# Patient Record
Sex: Male | Born: 1960
Health system: Southern US, Community
[De-identification: ages and names within clinical notes are randomized; demographics above are authoritative.]

## PROBLEM LIST (undated history)

## (undated) DIAGNOSIS — I219 Acute myocardial infarction, unspecified: Secondary | ICD-10-CM

## (undated) DIAGNOSIS — Z720 Tobacco use: Secondary | ICD-10-CM

## (undated) DIAGNOSIS — F419 Anxiety disorder, unspecified: Secondary | ICD-10-CM

## (undated) DIAGNOSIS — Z972 Presence of dental prosthetic device (complete) (partial): Secondary | ICD-10-CM

## (undated) DIAGNOSIS — M1990 Unspecified osteoarthritis, unspecified site: Secondary | ICD-10-CM

## (undated) DIAGNOSIS — Z973 Presence of spectacles and contact lenses: Secondary | ICD-10-CM

## (undated) DIAGNOSIS — C61 Malignant neoplasm of prostate: Secondary | ICD-10-CM

## (undated) DIAGNOSIS — C3411 Malignant neoplasm of upper lobe, right bronchus or lung: Secondary | ICD-10-CM

## (undated) DIAGNOSIS — T4145XA Adverse effect of unspecified anesthetic, initial encounter: Secondary | ICD-10-CM

## (undated) DIAGNOSIS — G4730 Sleep apnea, unspecified: Secondary | ICD-10-CM

## (undated) DIAGNOSIS — E785 Hyperlipidemia, unspecified: Secondary | ICD-10-CM

## (undated) DIAGNOSIS — G893 Neoplasm related pain (acute) (chronic): Secondary | ICD-10-CM

## (undated) DIAGNOSIS — R0600 Dyspnea, unspecified: Secondary | ICD-10-CM

## (undated) DIAGNOSIS — H9190 Unspecified hearing loss, unspecified ear: Secondary | ICD-10-CM

## (undated) DIAGNOSIS — T8859XA Other complications of anesthesia, initial encounter: Secondary | ICD-10-CM

## (undated) DIAGNOSIS — J449 Chronic obstructive pulmonary disease, unspecified: Secondary | ICD-10-CM

## (undated) DIAGNOSIS — I2510 Atherosclerotic heart disease of native coronary artery without angina pectoris: Secondary | ICD-10-CM

## (undated) DIAGNOSIS — K219 Gastro-esophageal reflux disease without esophagitis: Secondary | ICD-10-CM

## (undated) DIAGNOSIS — C787 Secondary malignant neoplasm of liver and intrahepatic bile duct: Secondary | ICD-10-CM

## (undated) DIAGNOSIS — I10 Essential (primary) hypertension: Secondary | ICD-10-CM

## (undated) HISTORY — DX: Tobacco use: Z72.0

## (undated) HISTORY — DX: Neoplasm related pain (acute) (chronic): G89.3

## (undated) HISTORY — DX: Acute myocardial infarction, unspecified: I21.9

## (undated) HISTORY — DX: Malignant neoplasm of upper lobe, right bronchus or lung: C34.11

## (undated) HISTORY — DX: Hyperlipidemia, unspecified: E78.5

## (undated) HISTORY — PX: HAND SURGERY: SHX662

## (undated) HISTORY — DX: Atherosclerotic heart disease of native coronary artery without angina pectoris: I25.10

## (undated) HISTORY — DX: Gastro-esophageal reflux disease without esophagitis: K21.9

## (undated) HISTORY — PX: PROSTATE BIOPSY: SHX241

---

## 2005-10-29 HISTORY — PX: CARDIAC CATHETERIZATION: SHX172

## 2006-04-11 ENCOUNTER — Emergency Department (HOSPITAL_COMMUNITY): Admission: EM | Admit: 2006-04-11 | Discharge: 2006-04-11 | Payer: Self-pay | Admitting: Family Medicine

## 2006-04-17 ENCOUNTER — Inpatient Hospital Stay (HOSPITAL_COMMUNITY): Admission: EM | Admit: 2006-04-17 | Discharge: 2006-04-21 | Payer: Self-pay | Admitting: Emergency Medicine

## 2006-04-17 DIAGNOSIS — I219 Acute myocardial infarction, unspecified: Secondary | ICD-10-CM

## 2006-04-17 HISTORY — DX: Acute myocardial infarction, unspecified: I21.9

## 2006-04-17 IMAGING — CR DG CHEST 1V PORT
1 series · 1 of 1 positions shown · non-contrast
Comparison: None.

CLINICAL DATA: Pre-cath.  ST elevation MI.  
PORTABLE CHEST - 1 VIEW:

[view not recorded]
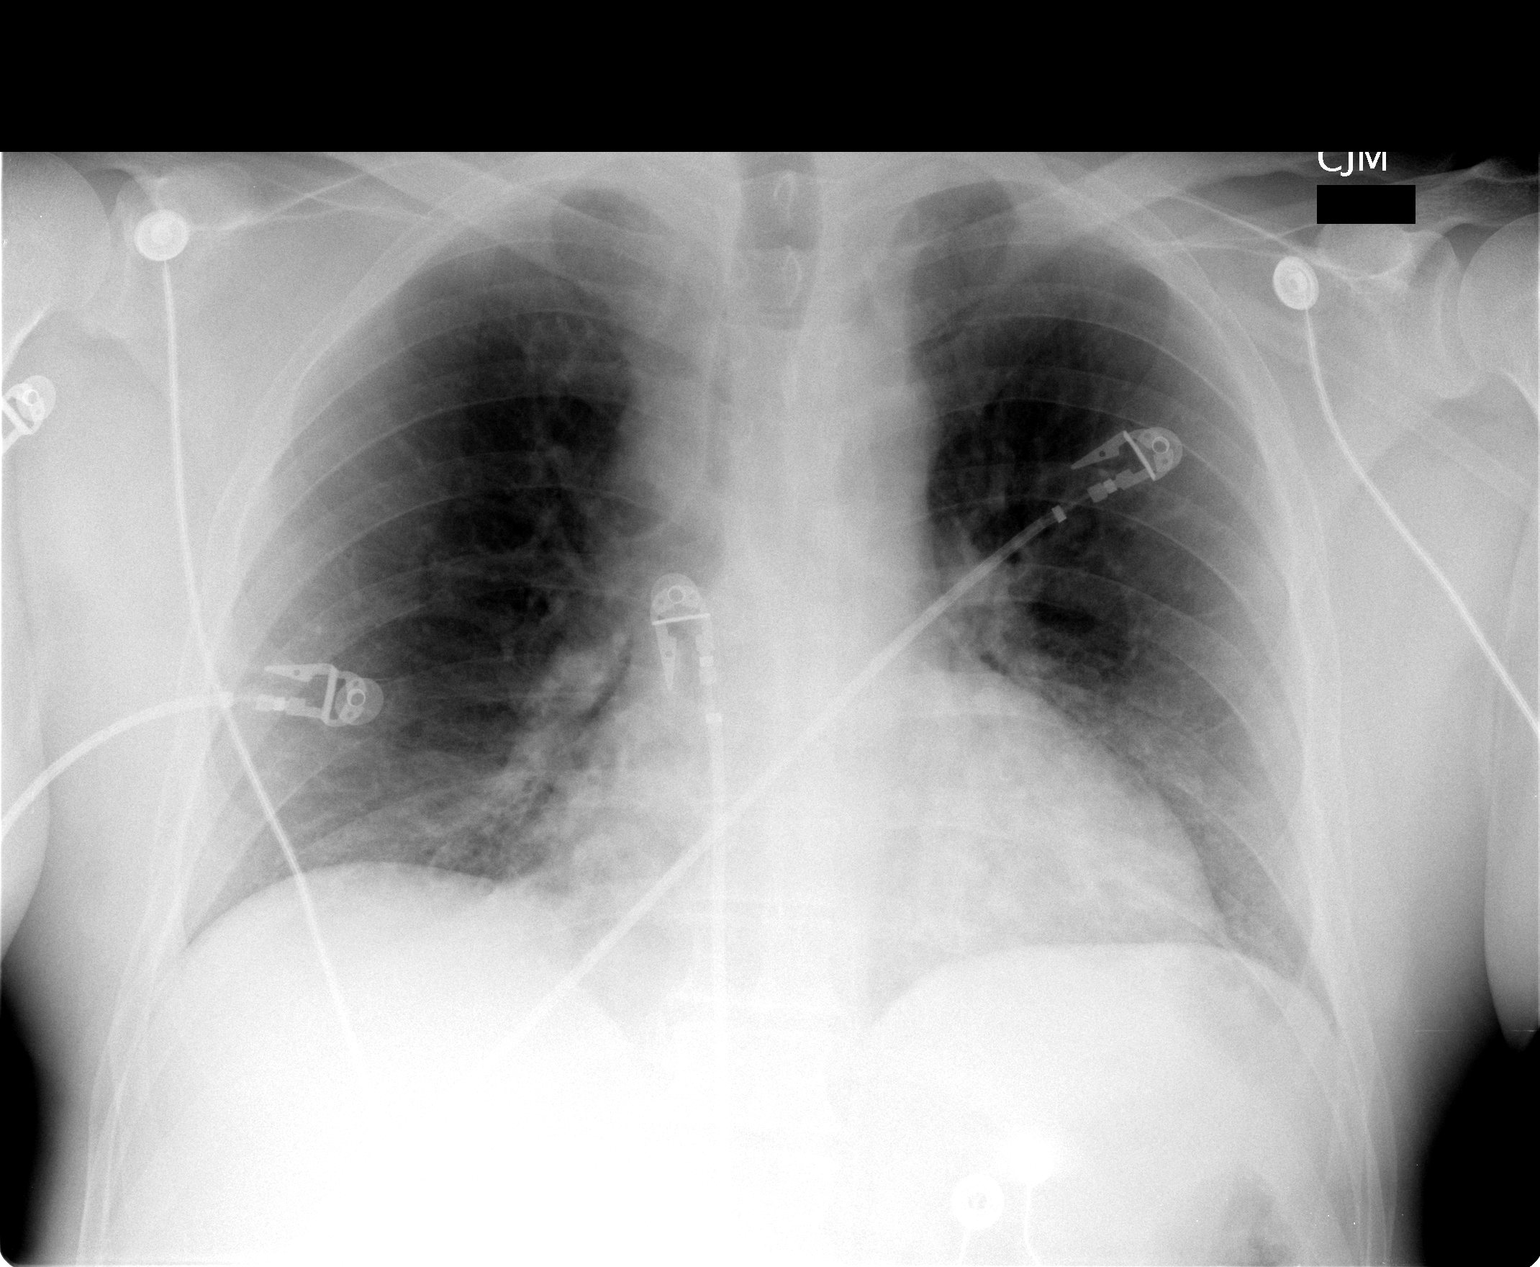

[1 of 1 positions shown; findings below may reference images not displayed]

FINDINGS: Heart size is mildly enlarged without effusions or edema.  There is mild atelectasis at the right base.  No evidence for pneumonia.
IMPRESSION: Cardiomegaly and mild right base atelectasis.

## 2006-04-19 ENCOUNTER — Encounter (INDEPENDENT_AMBULATORY_CARE_PROVIDER_SITE_OTHER): Payer: Self-pay | Admitting: Neurology

## 2006-04-19 IMAGING — CT CT HEAD WO/W CM
1 of 2 series · 14 of 30 positions shown, 18 images · IV contrast (omnipaque)
Comparison: None.

CLINICAL DATA: Headaches.
 HEAD CT WITHOUT AND WITH CONTRAST ? [DATE]:
TECHNIQUE: Contiguous axial images were obtained from the base of the skull through the vertex according to standard protocol before and after administration of intravenous contrast.
 Contrast:  80 cc Omnipaque 300 IV.

[Series 2: brain w/o 4.8 h45s st · axial · non-contrast · 0.45mm/px · z∈[-94,+56]mm · 14 of 36 slices shown, 18 images]
[im 3/36  brain]
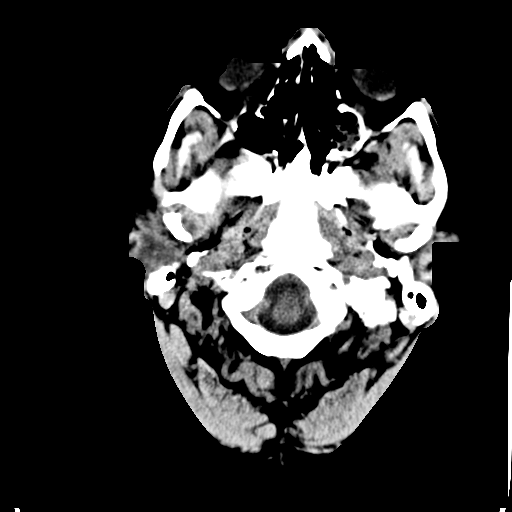
[im 3/36  bone]
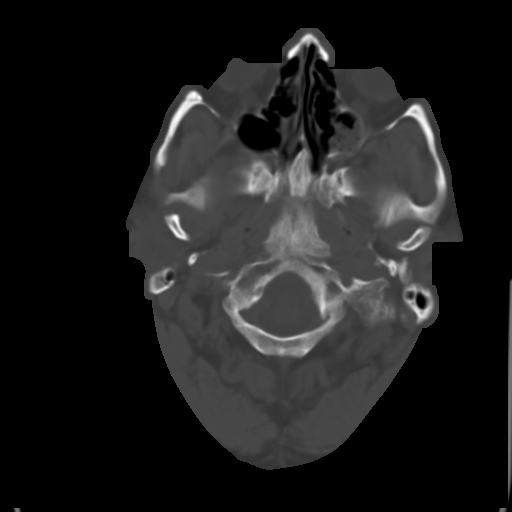
[im 5/36  brain]
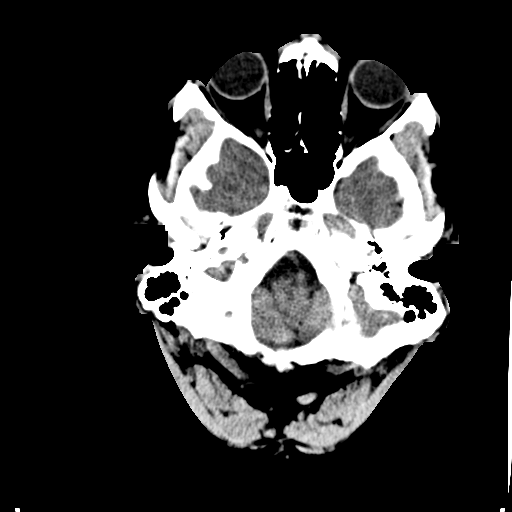
[im 8/36  brain]
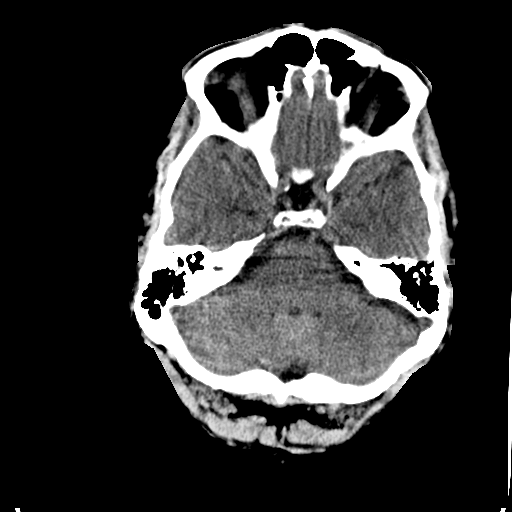
[im 10/36  brain]
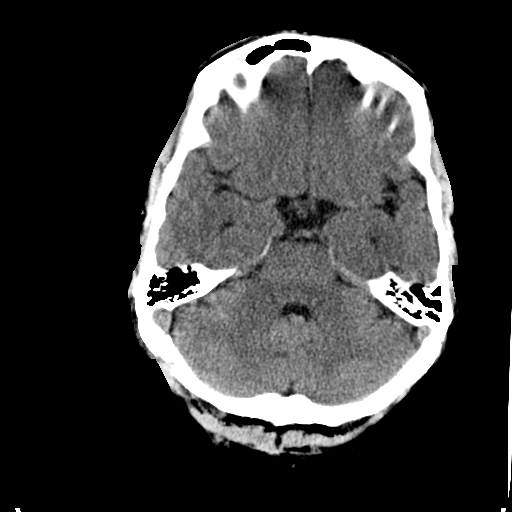
[im 12/36  brain]
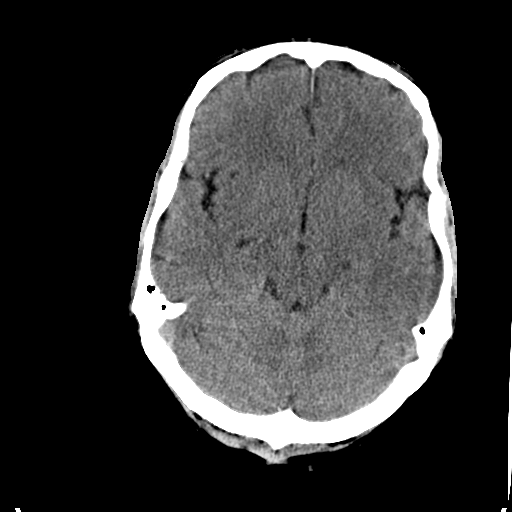
[im 12/36  bone]
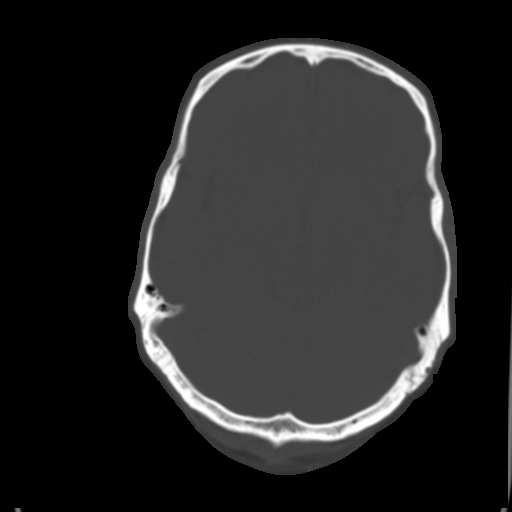
[im 15/36  brain]
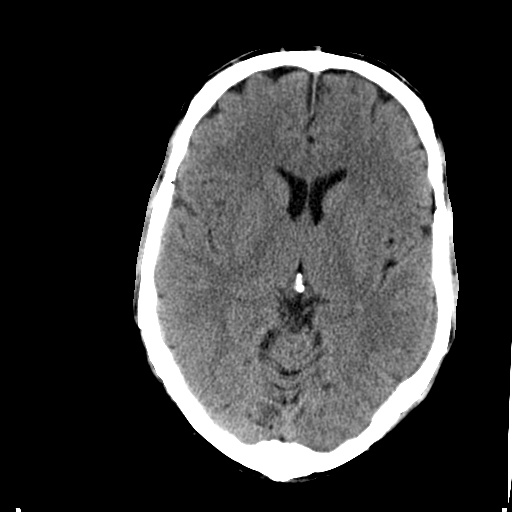
[im 17/36  brain]
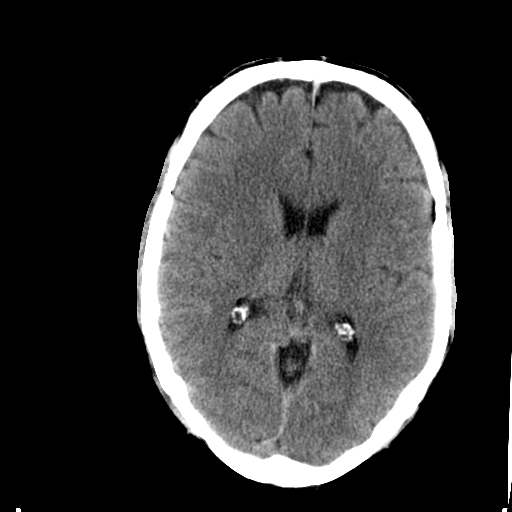
[im 19/36  brain]
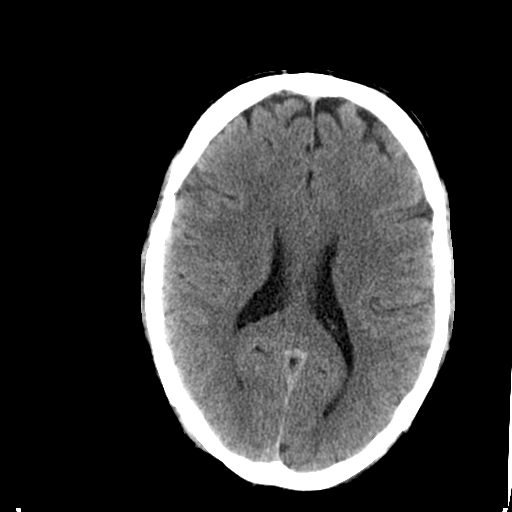
[im 22/36  brain]
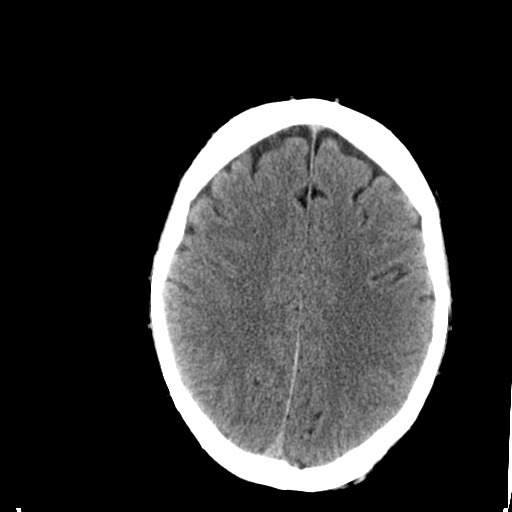
[im 22/36  bone]
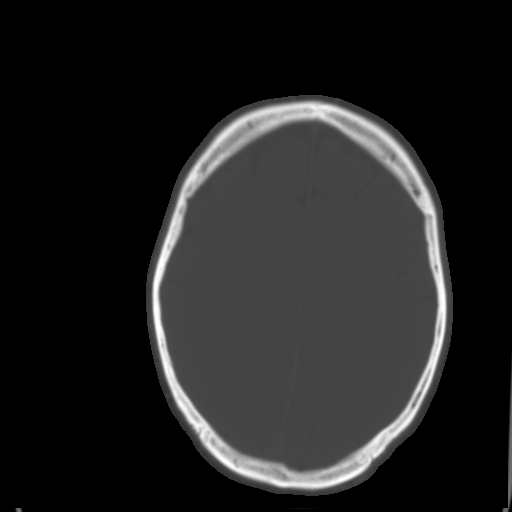
[im 24/36  brain]
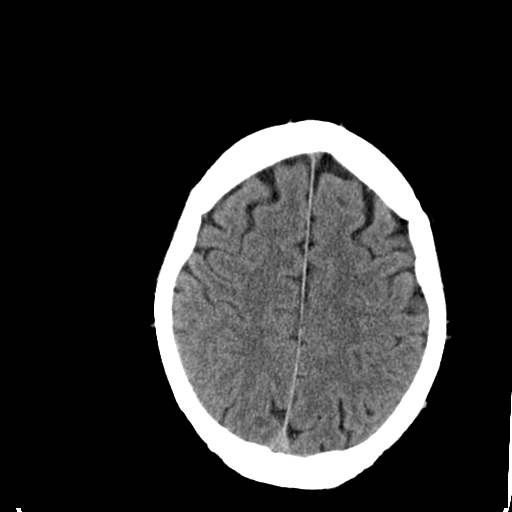
[im 26/36  brain]
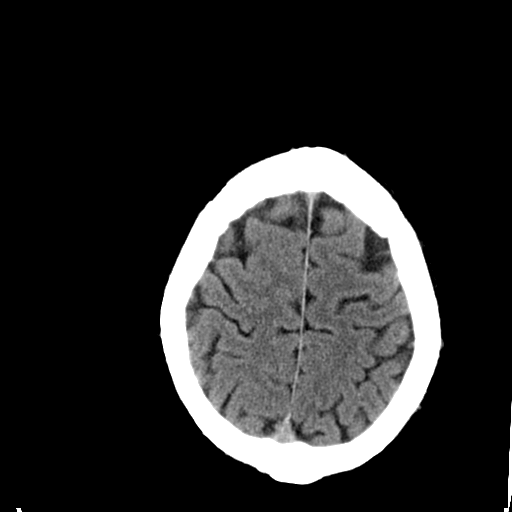
[im 29/36  brain]
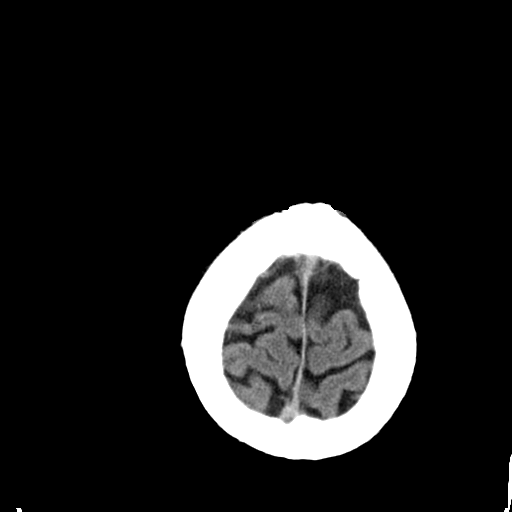
[im 31/36  brain]
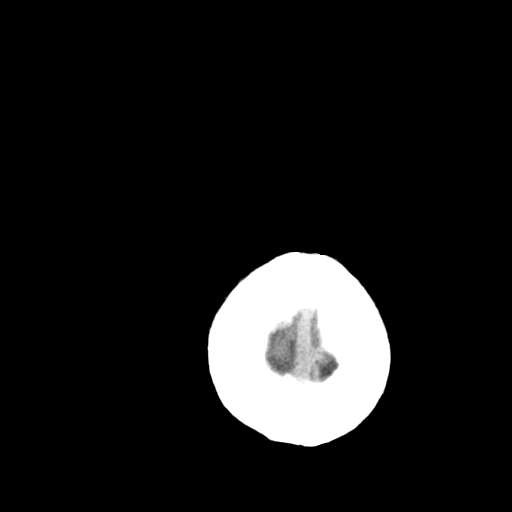
[im 31/36  bone]
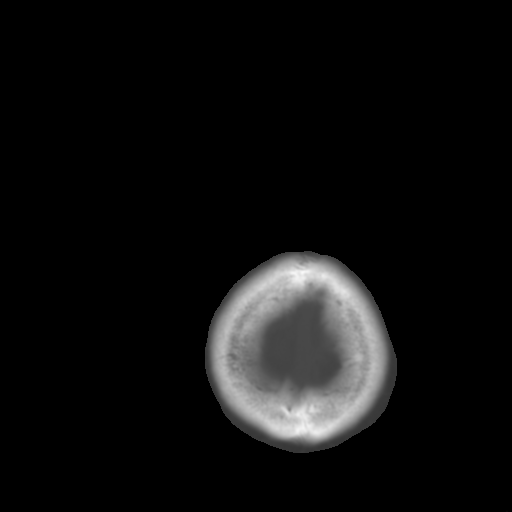
[im 33/36  brain]
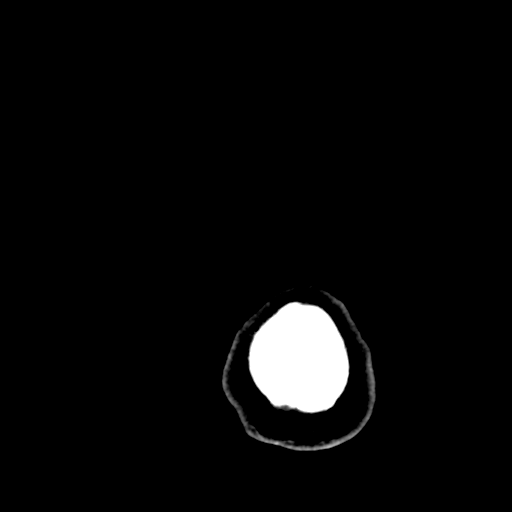

[14 of 30 positions shown; findings below may reference images not displayed]

FINDINGS: Ventricular size and CSF space are normal.  No acute or focal abnormality.  Only normal vascular structures appear to enhance.  The calvarium is intact.  There is an air-fluid level in the left maxillary sinus along with some mucosal thickening.  The other sinuses appear to be unremarkable.  
 Could the patient?s sinusitis be the cause of his headaches?
IMPRESSION: Normal except for left maxillary sinusitis which has acute and chronic components.

## 2007-03-19 ENCOUNTER — Emergency Department (HOSPITAL_COMMUNITY): Admission: EM | Admit: 2007-03-19 | Discharge: 2007-03-19 | Payer: Self-pay | Admitting: Emergency Medicine

## 2008-01-23 ENCOUNTER — Emergency Department (HOSPITAL_COMMUNITY): Admission: EM | Admit: 2008-01-23 | Discharge: 2008-01-23 | Payer: Self-pay | Admitting: Emergency Medicine

## 2008-01-23 IMAGING — CT CT HEAD W/O CM
1 series · 16 of 30 positions shown, 20 images · non-contrast
Comparison: 

CLINICAL DATA: SYNCOPE

CT HEAD WITHOUT CONTRAST
TECHNIQUE: Contiguous axial images were obtained from the base of
the skull through the vertex without contrast

[Series 2: brain · axial · 0.47mm/px · z∈[+188,+325]mm · 16 of 34 slices shown, 20 images]
[im 2/34  brain]
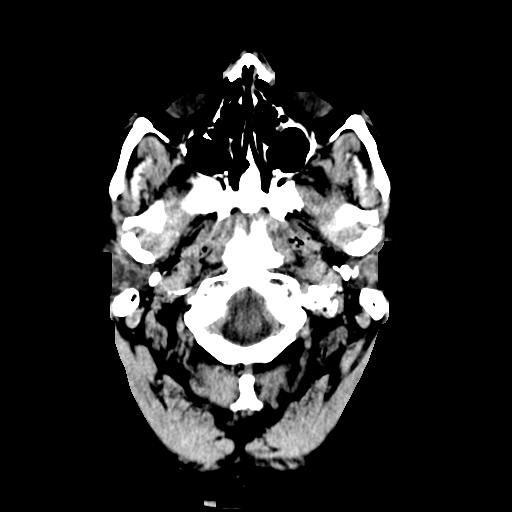
[im 2/34  bone]
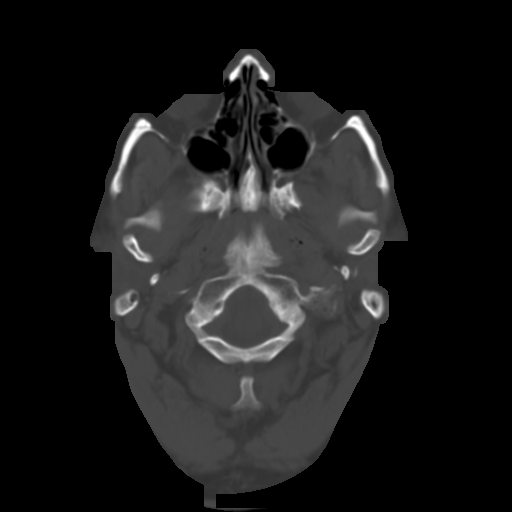
[im 4/34  brain]
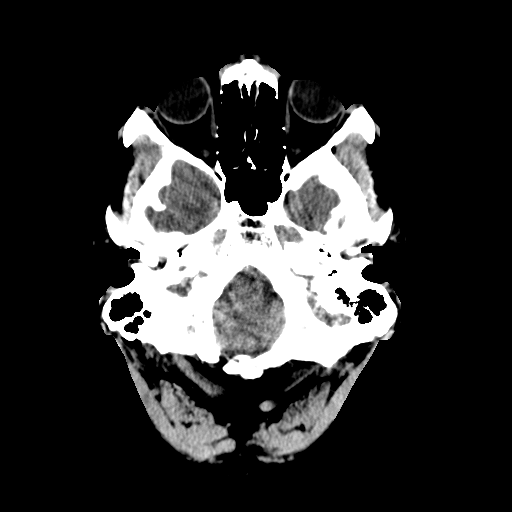
[im 6/34  brain]
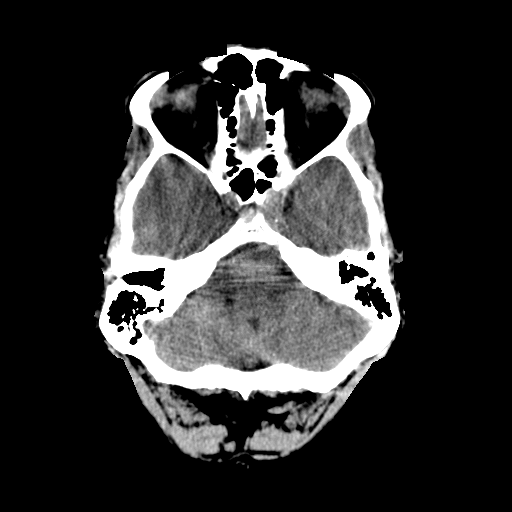
[im 8/34  brain]
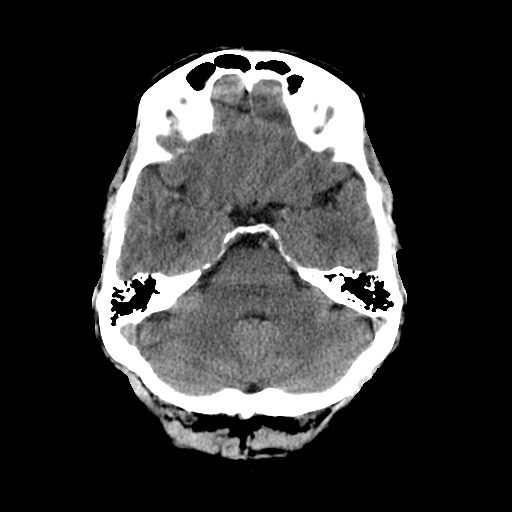
[im 10/34  brain]
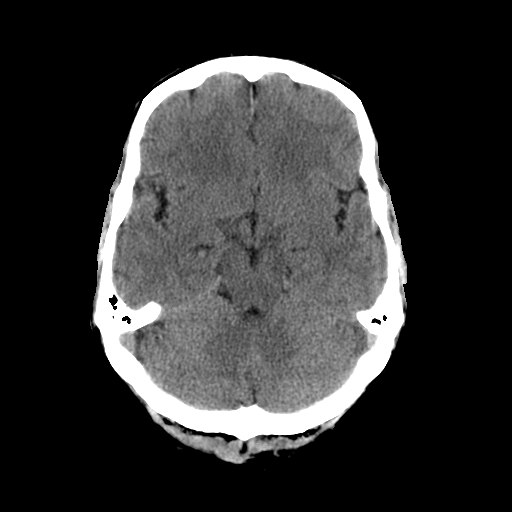
[im 10/34  bone]
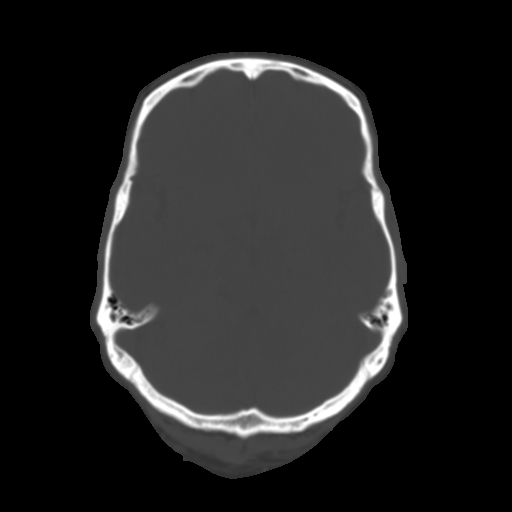
[im 12/34  brain]
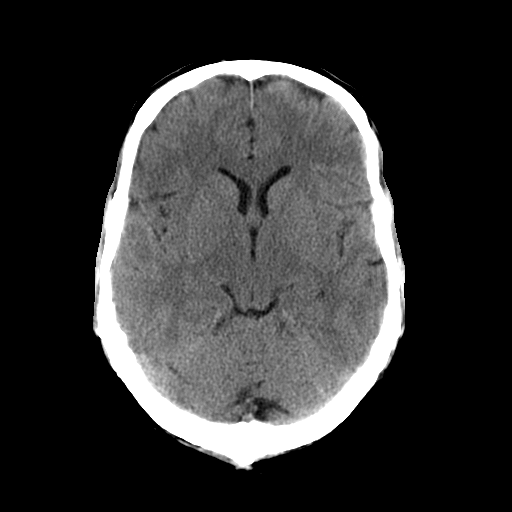
[im 14/34  brain]
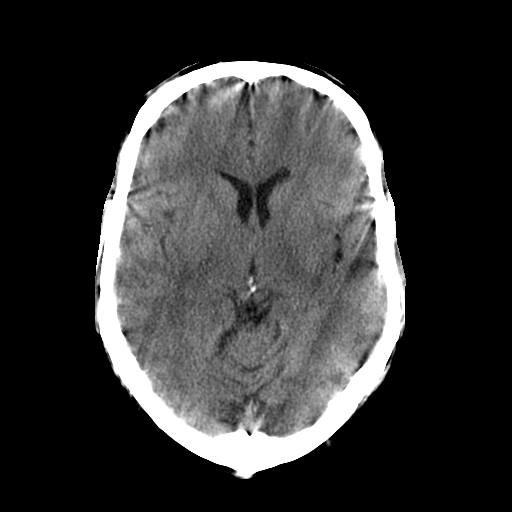
[im 16/34  brain]
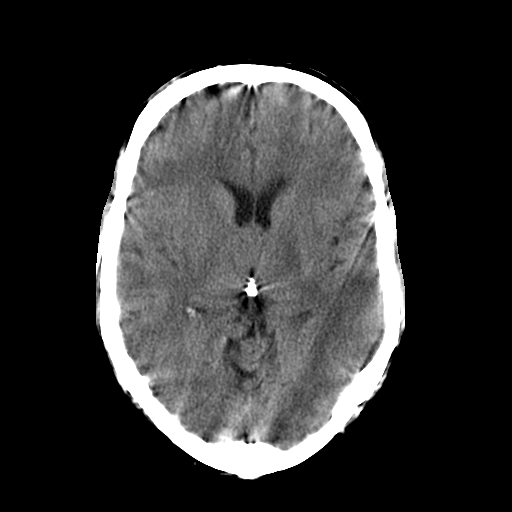
[im 18/34  brain]
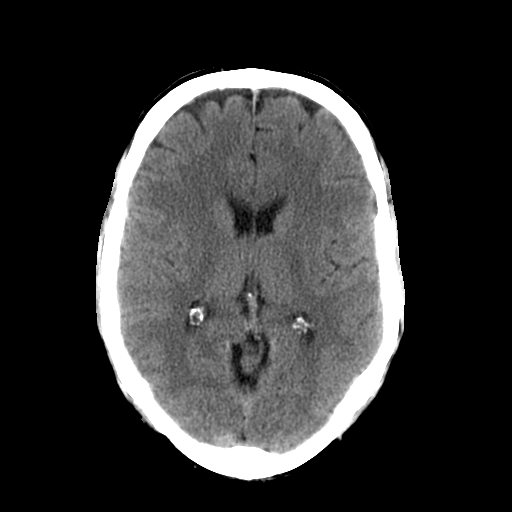
[im 18/34  bone]
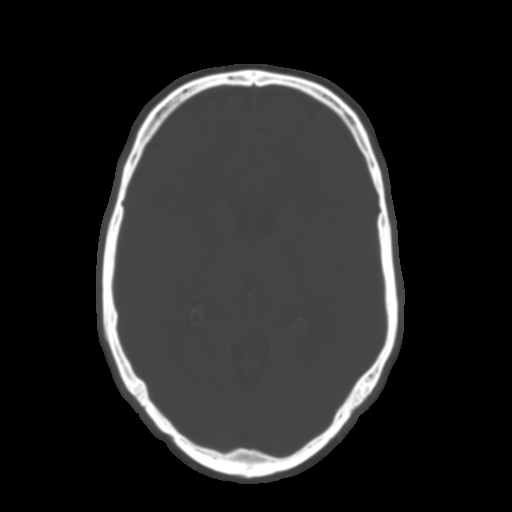
[im 20/34  brain]
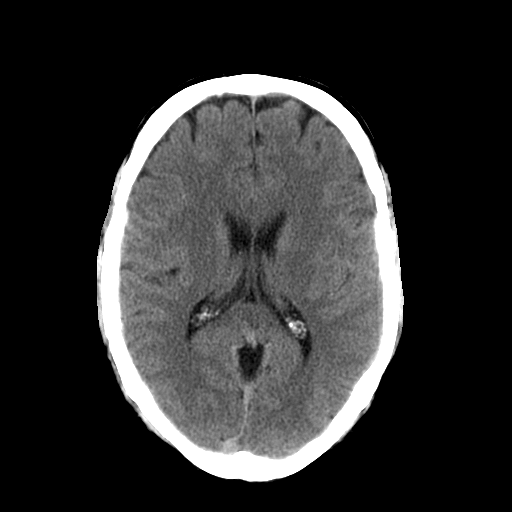
[im 22/34  brain]
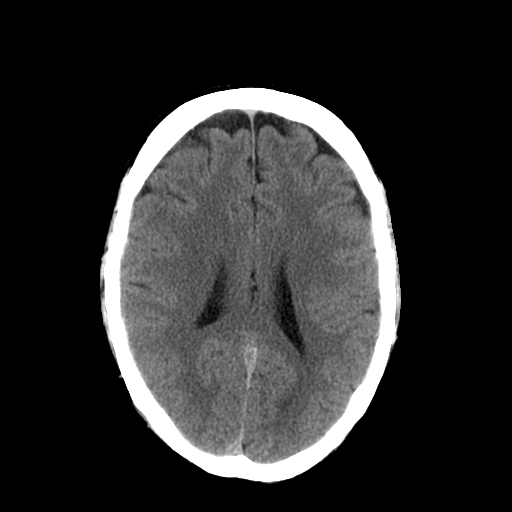
[im 24/34  brain]
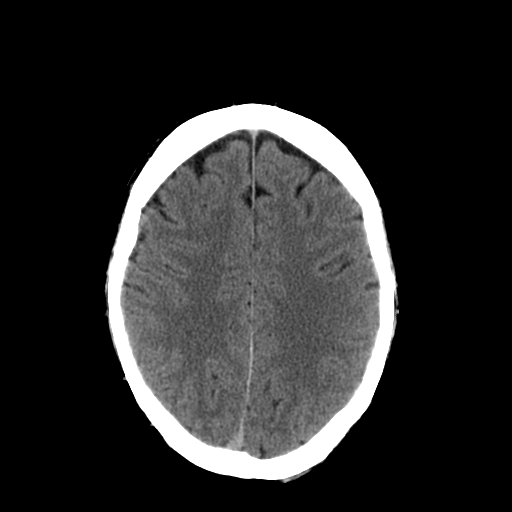
[im 26/34  brain]
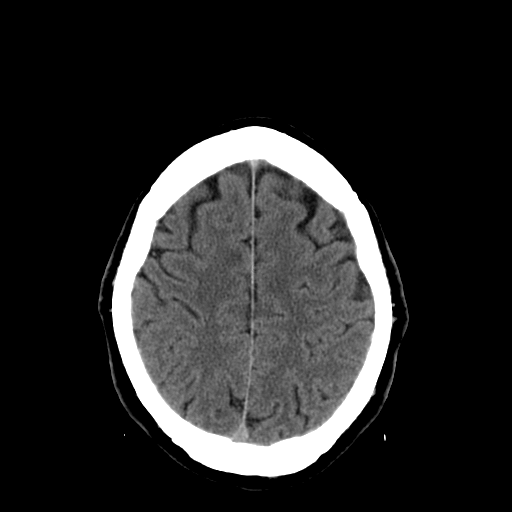
[im 26/34  bone]
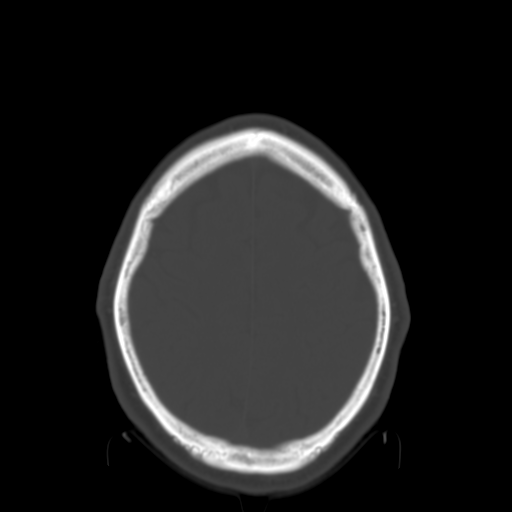
[im 28/34  brain]
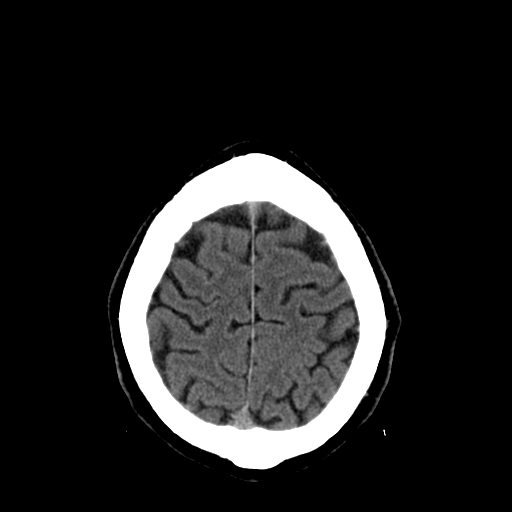
[im 30/34  brain]
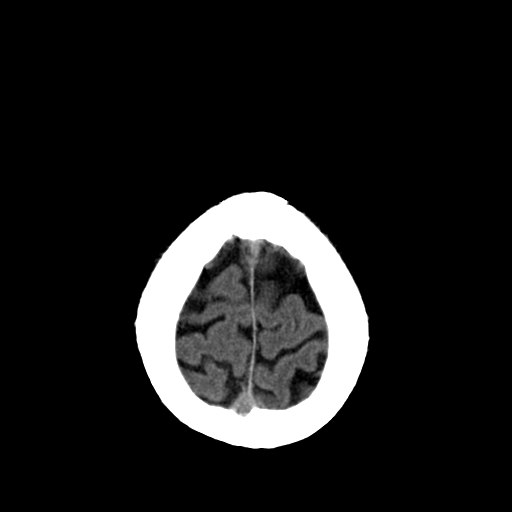
[im 32/34  brain]
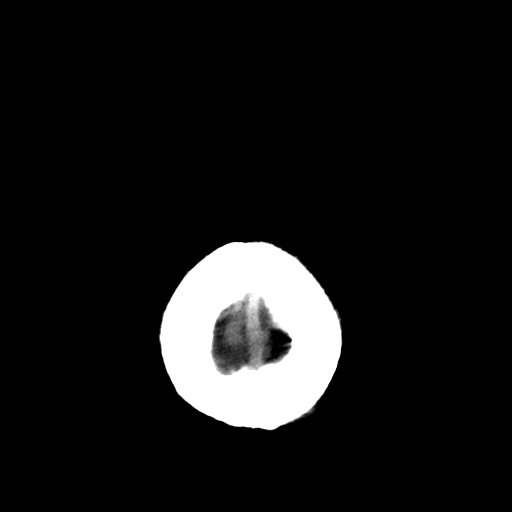

[16 of 30 positions shown; findings below may reference images not displayed]

FINDINGS: The brain has a normal appearance without evidence for
hemorrhage, acute infarction, hydrocephalus, or mass lesion.  There
is no extraaxial fluid collection.  The skull and paranasal sinuses
are normal.
IMPRESSION: Normal CT of the head without contrast.

## 2008-01-23 IMAGING — CR DG CHEST 2V
2 series · 2 of 2 positions shown · non-contrast
Comparison: [DATE]

CLINICAL DATA: Syncope, chest pain

CHEST - 2 VIEW

[w chest pa]
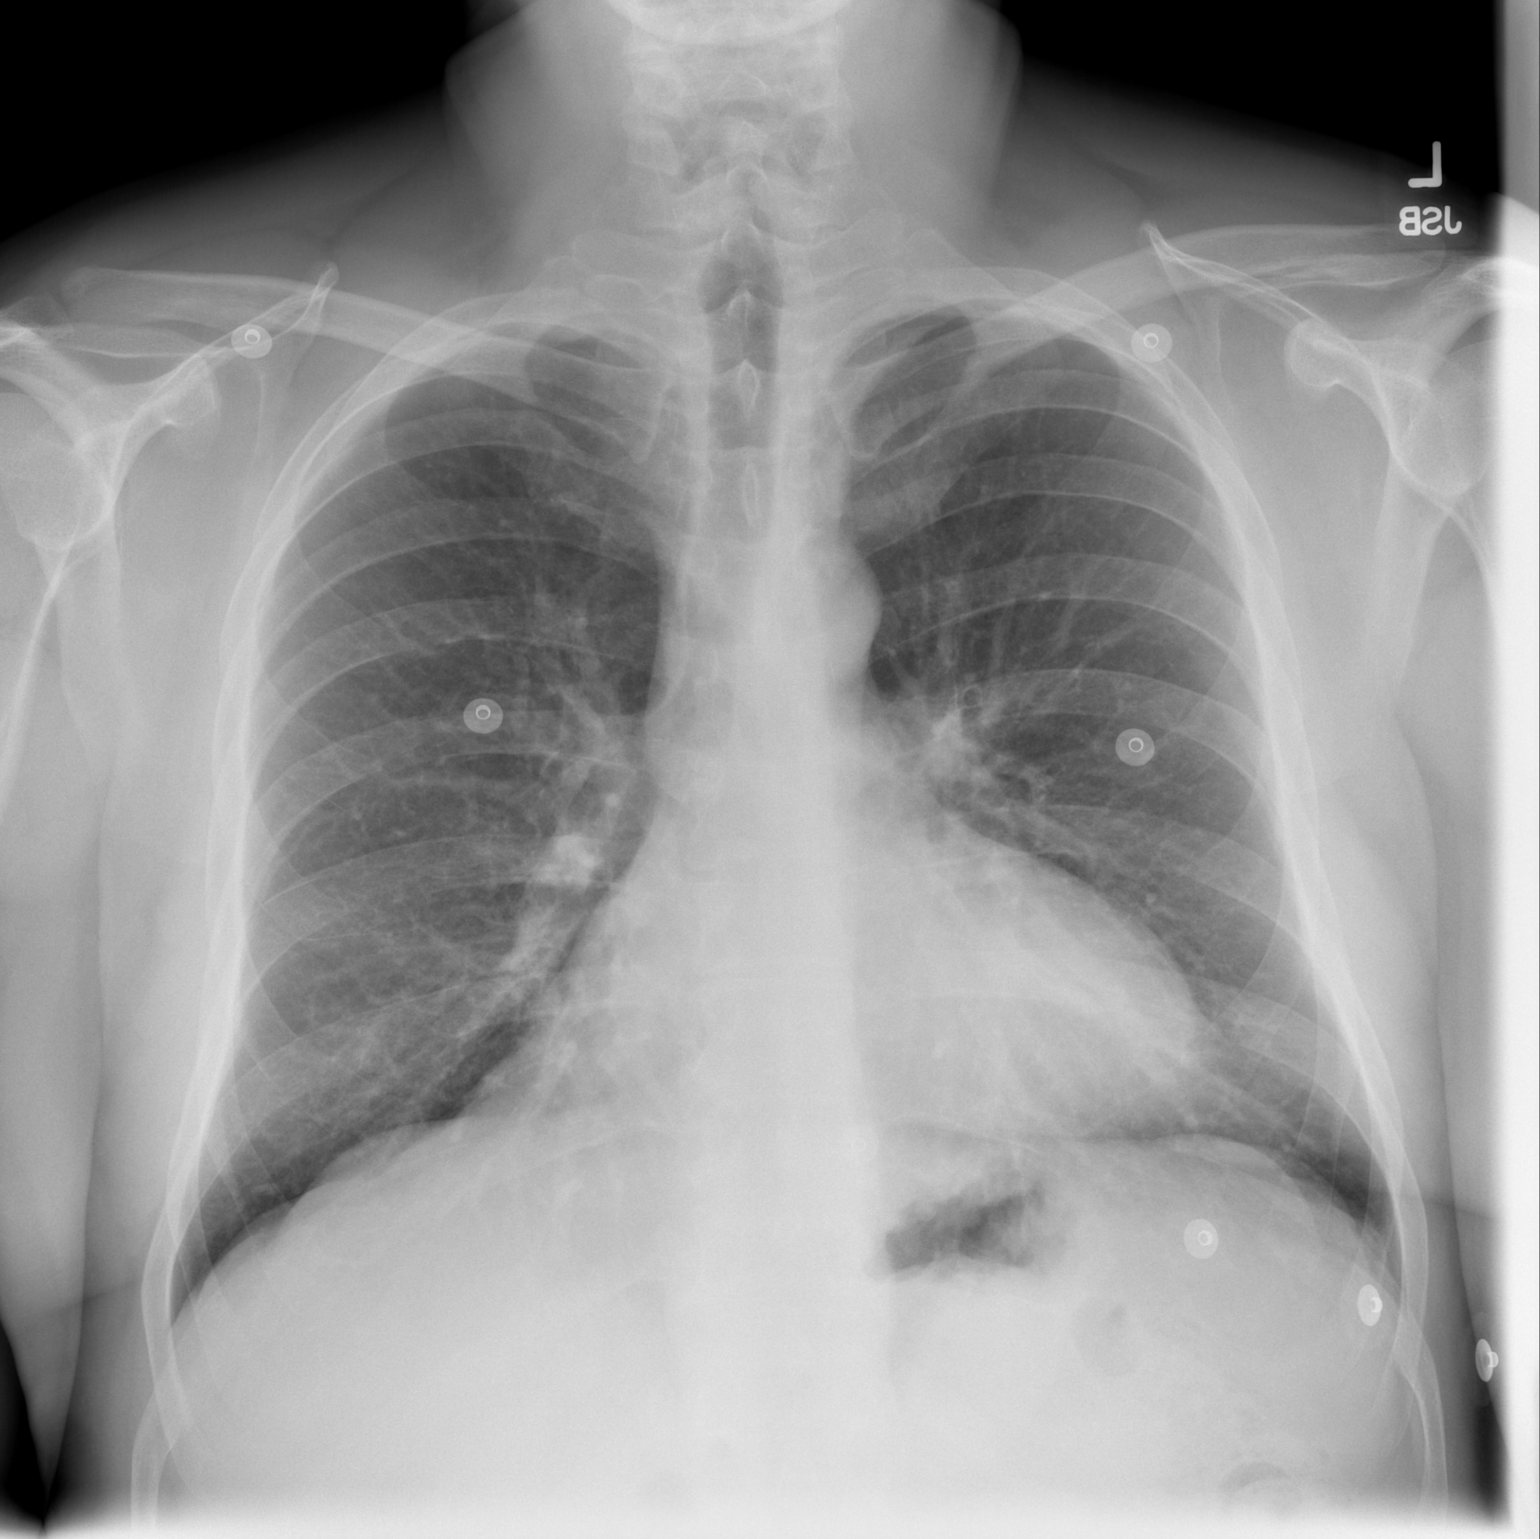

[w chest lat]
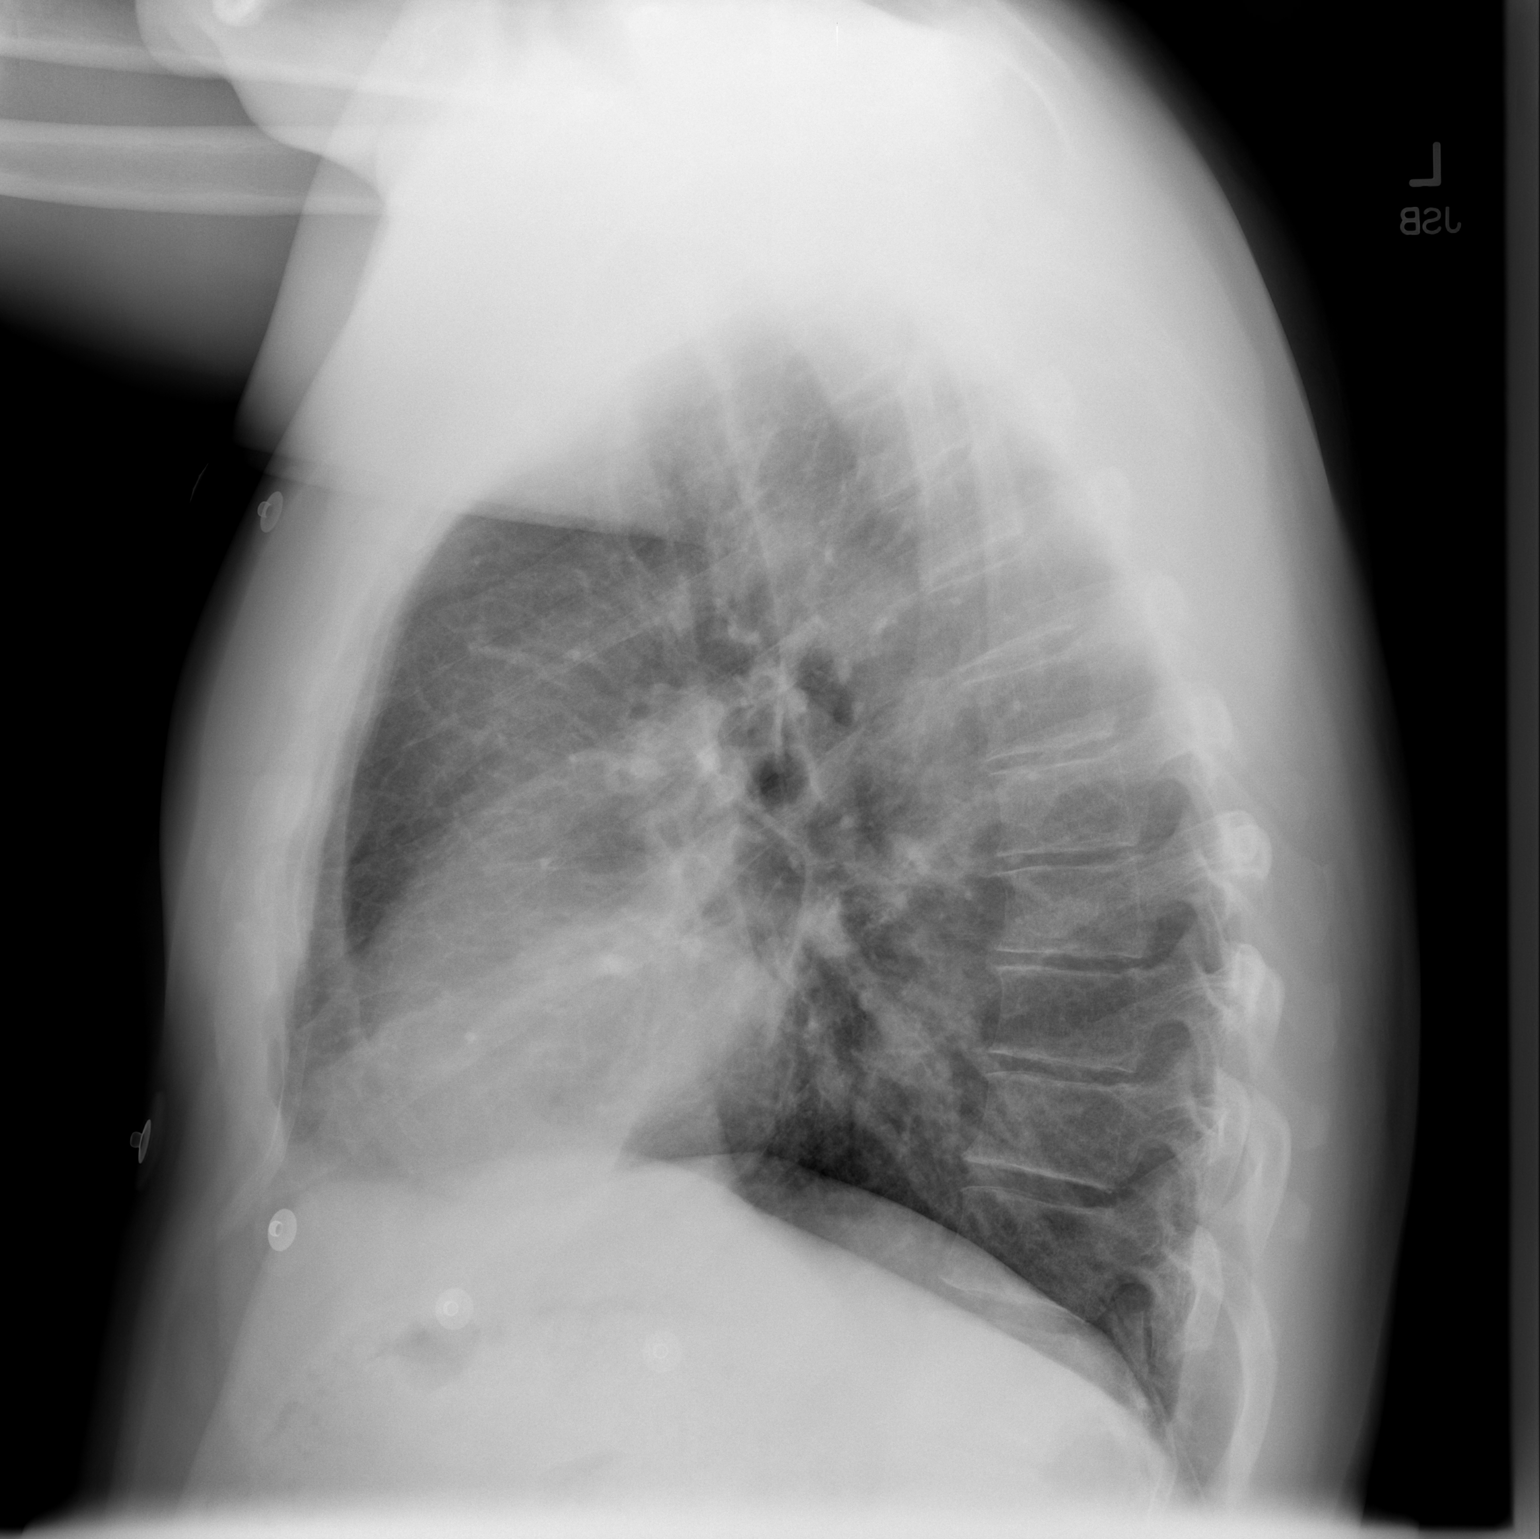

[2 of 2 positions shown; findings below may reference images not displayed]

FINDINGS: The lungs are clear.  The heart is within upper limits of
normal.  No bony abnormality is seen.
IMPRESSION: No active lung disease.

## 2011-03-16 NOTE — Discharge Summary (Signed)
Chris [name], CORSINO NO.:  [id number]   MEDICAL RECORD NO.:  [id number]          PATIENT TYPE:  INP   LOCATION:  2034                         FACILITY:  [facility]   PHYSICIAN:  Nanetta Batty, M.D.   DATE OF BIRTH:  Jun 11, 1961   DATE OF ADMISSION:  04/17/2006  DATE OF DISCHARGE:  04/21/2006                                 DISCHARGE SUMMARY   DISCHARGE DIAGNOSES:  1.  Acute inferior myocardial infarction with right ventricular involvement.      1.  Emergent cardiac catheterization and rescue percutaneous          transluminal coronary angioplasty and stent deployment to the right          coronary artery.  2.  Left ventricular dysfunction, ejection fraction 40% to 45%.  3.  Complete heart block with temporary transvenous pacemaker, resolved.  4.  Hypotension, resolved.  5.  Right brain transient ischemic attack.  6.  Syncope.  7.  Dyslipidemia.   DISCHARGE MEDICATIONS:  1.  Lopressor 1/2 tablet twice a day to equal 25 mg twice a day.  2.  Vytorin 10/20 one daily.  3.  Aspirin 325 daily.  4.  Plavix 75 mg 1 daily, do not stop.  5.  Lotensin 5 mg daily.  6.  Nitroglycerin 1/150th sublingual chest pain p.r.n.  7.  Ativan 0.5 mg 1 every 8 hours as needed for anxiety.   DISCHARGE INSTRUCTIONS:  1.  No work until you see Dr. Jenne Campus.  2.  Low fat diet.  3.  Increase activity slowly.  4.  No driving for 2 weeks.  5.  No lifting for 2 weeks.  6.  Wash right groin cath site with soap and water.  Call if any bleeding,      swelling, or drainage.  7.  Follow up with Dr. Jenne Campus.  The office should call you for a date and      appointment for 2-3 weeks from discharge.   HISTORY OF PRESENT ILLNESS:  The patient is a 50 year old white married male  with no prior cardiac history.  Did have recent TIA and positive tobacco  use.  Was seen emergently in the ER after the patient presented with chest  pain and acute myocardial infarction.  EMS has been called and the  patient  was found sitting on the edge of the bed, wanting to lie down, weak.  He did  have chest pain.  The patient was unable to communicate well; he felt so  ill.  Blood pressure was low.  They were unable to give him any beta-  blockers.  Blood pressure was 70/50.  He had general weakness, chest pain,  and it was a sudden onset.  Heart rate was 48, respirations 20.  EKG showed  an acute inferior MI with ST elevations in 2, 3, aVF, as well as deep T-wave  inversions in aVL, ST elevations in V1, 2, 3, 4, and 5.  Bradycardic and  appeared to be in complete heart block.  He was brought in and taken  emergently to the catheterization lab, where temporary intravenous  pacemaker  was placed, and the patient underwent cardiac catheterization revealing 100%  stenosis of the proximal to mid RCA.  He had stent deployment x2 by Dr.  Allyson Sabal.   PAST MEDICAL HISTORY:  Essentially negative except for tobacco use, though  on April 11, 2006, he possibly had a TIA.  His glucose initially was  elevated, but that resolved, and his glycohemoglobin was negative.   FAMILY HISTORY:  Positive for stroke and diabetes.   SOCIAL HISTORY:  He is married, does smoke 1/2 pack per day.  No alcohol  use.  No drug use.  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   REVIEW OF SYSTEMS:  See H&P.   PHYSICAL EXAMINATION:  At discharge, blood pressure 118/75, pulse 75,  respirations 20, temperature 97.3.  General:  Alert and oriented white male.  Lungs clear to auscultation bilaterally.  Heart:  Regular rate and rhythm.  S1 and S2.  No S3.  Extremities without edema.  Cath site stable.   LABORATORY DATA:  Admit labs, hemoglobin 14.7, hematocrit 42.1, WBC 13.6,  platelets 231.  These remained stable.  White count came down to 10.2.  Sodium 141, potassium initially 3.4 that was replaced.  Chloride 106, CO2  25, glucose 179, BUN 20, creatinine 1.6, and at discharge it was 1.2.   Glycohemoglobin 5.9.  I do not have a homocysteine level at discharge.   CK  119, MB 1.3.  It came up to 3949 with an MB of 397, and by April 20, 2006, had dropped down to 823, with an MB of 25.9.  Troponin-I peaked at  89.55 and prior to discharge 22.73.  Total cholesterol 131, triglycerides  115, HDL 26, and LDL 82.   Chest x-ray:  Cardiomegaly and mild right base atelectasis.  EKGs initially  acute inferior MI with ST elevations, 2, 3, aVF, as well as V3, 5, and 6 on  the EMS EKG; that EKG did not describe previously.   Followup EKG on April 18, 2006, sinus rhythm, inferior-posterior infarction.  No previous anterior ST elevation continued.  The followup on April 18, 2006,  plus ST elevations, sinus rhythm.  T-waves had flipped in 3 and aVF.  Cardiac catheterization as described.   HOSPITAL COURSE:  A 50 year old white male in generally good health,  presented with an acute MI, complete heart block, and hypotensive by EMS to  the ER on April 17, 2006.  Was seen and evaluated by Dr. Allyson Sabal, taken to the  cath lab, temporary pacemaker was inserted, and underwent cardiac cath, as  well as stent deployment x2 with drug-eluting stents.  The patient tolerated  the procedure.  He had problems with hypotension post procedure.  He was  positive for an acute MI.  His glucose had been elevated but glycohemoglobin  was negative.  He continued to improve but with history of TIA prior to  admission, neuro consult was obtained.  Dr. Sharene Skeans felt he had a right  brain TIA and syncope prior to his admission.  Carotid Dopplers were done  and negative for carotid stenosis.  A 2D echo has been done.  CT of his head  was done, normal except for left maxillary sinusitis, which has acute and  chronic components.  The patient continued to improve with IV fluids, was  able to start beta-blocker, and was transferred to the telemetry unit.  By  April 20, 2006, he ambulated with cardiac rehab, vital  signs were  stable. Only concern is source of embolus.  The 2D echo results continue to be  pending.  He is on aspirin and Plavix.  He was seen April 21, 2006, he was  ambulating without problems.  Dr. Jenne Campus saw him and felt he was ready for  discharge home.  Please note, he does have issues financially.  The hospital  provided 2 weeks of Plavix.  We will attempt to have him get some through  our office as well, and for the rest of his meds, were generic, and he was  instructed to go to [facility] or [facility] for 4-dollar prescriptions.  The  patient was then discharged home.      Darcella Gasman. Annie Paras, N.P.      Nanetta Batty, M.D.  Electronically Signed    LRI/MEDQ  D:  04/21/2006  T:  04/21/2006  Job:  [id number]   cc:   Delman Cheadle, MD  Fax: [phone]   Deanna Artis. Sharene Skeans, M.D.  Fax: [phone]

## 2011-03-16 NOTE — Cardiovascular Report (Signed)
NAME:  MARLENE, PFLUGER.:  [id number]   MEDICAL RECORD NO.:  [id number]          PATIENT TYPE:  INP   LOCATION:  2915                         FACILITY:  [facility]   PHYSICIAN:  Nanetta Batty, M.D.   DATE OF BIRTH:  July 16, 1961   DATE OF PROCEDURE:  04/17/2006  DATE OF DISCHARGE:                              CARDIAC CATHETERIZATION   HISTORY:  Mr. Dreisbach is a 50 year old married white male father of one  adopted son who has no prior cardiac history.  He does smoke and is on no  medications.  He apparently had a TIA versus small stroke several weeks  ago. However, further evaluation was not pursued.  He has healing week today  and was found by his wife collapsed and unresponsive on his bed at  approximately 9:00 this evening.  He was brought by EMS to [facility]  catheterization lab.  EKG in route showed acute inferolateral wall  myocardial infarction with complete heart block.  On presentation, he was  hypotensive and received a heparin bolus as well as IV fluids.  As well,  pacer was placed on his chest for transcutaneous external pacing.  He was  treated with aspirin as well.  He was brought to the catheterization lab  emergently for angiography and intervention.   DESCRIPTION OF PROCEDURE:  The patient was brought to the second floor [facility] cardiac catheterization lab in the postabsorptive absorptive state in  the setting of an acute inferior wall infarction.  Both groins were prepped  and draped in the usual sterile fashion.  One percent lidocaine was used for  local anesthesia.  A 7-French sheath was inserted into the right femoral  artery using standard Seldinger technique.  A 6-French sheath was inserted  into right femoral vein.  The patient received a total 7500 units of heparin  intravenously with an __________ of 232 after which he received an  additional 2000 units of heparin at the end of the case.  He was treated  with Plavix 600 mg p.o. as well  as Integrilin double-bolus infusion.  Isovue  dye was used for the entirety of the case.  Intra-aortic pressures monitored  during the case.  The patient was hypotensive during the case, requiring IV  fluids and dopamine.  Prior to angiography, a temporary transvenous  pacemaker was placed in the RV apex with documented excellent capture at a  rate of 670 with MA of 5.   ANGIOGRAPHIC RESULTS:  1.  Left main normal.  2.  LAD normal.  3.  Left circumflex nondominant, normal.  4.  Right coronary artery was large, dominant and a 100% occluded with what      appeared to be a ruptured plaque with thrombus in the proximal segment.  5.  Left ventriculography; RAO left ventriculogram was performed using 25 mL      of Visipaque dye at 12 mL/second.  The left ventricular ejection      fraction estimated between 40-45% with moderate to severe      inferior/inferobasal hypokinesia.   IMPRESSION:  Acute inferior wall myocardial infarction with  cardiogenic  shock and complete heart block.  He will proceed with direct angioplasty  using drug-eluting stent and Integrilin.   Using a 7-French JR-4 side-hole guide catheter as well as __________ Leota Jacobsen  soft wire and 2.5/15 Maverick, angioplasty was performed.  The balloon time  was estimated at 73 minutes and chest pain to balloon time at 2 hours 13  minutes.  Following this, after resumption of antegrade flow, a 3.5/18  Cypher stent was then deployed across the lesion at 16 atmospheres.  There  did appear to be a linear filling defect distally which in multiple views  was determined to be a dissection either from the plaque or from the stent  edge.  Because of this, a 3.5/33 Cypher drug-eluting stent was then deployed  in overlapping fashion, tacking down the dissection plane with excellent  TIMI III flow at the end of stent deployment.  The entire stented segment  was then post dilated with a 3.75 x 20 Quantum Maverick at 16 atmospheres  (3.9 mm).   There was TIMI 3 flow at the end of the case without evidence of  dissection or thrombus.  Guidewire and catheter were removed.  Sheaths were  sewn securely in place.  The patient's blood pressure was in the 120 to 130  range, in sinus tach.  The temporary transvenous pacemaker was left in place  for transport to the CCU.   IMPRESSION:  Successful direct percutaneous coronary intervention and  stenting using drug-eluting stent in the setting of acute inferior wall  month infarction complicated by cardiogenic shock and complete heart block  with the door to balloon time of 73 minutes chest pain to balloon time of 2  hours and 13 minutes.  The patient has mild to moderate decrease in left  ventricular function which I suspect will improve over time.  He will be  treated with Integrilin for 24 hours and then standard treatment with  aspirin, Plavix, beta blockers and statin therapy.  He left the lab with a  stable blood pressure and rhythm.      Nanetta Batty, M.D.  Electronically Signed     JB/MEDQ  D:  04/18/2006  T:  04/18/2006  Job:  [id number]   cc:   [facility]  [address] [place], [place] [address]

## 2011-03-16 NOTE — H&P (Signed)
NAME:  Chris [name], Chris [name] NO.:  [id number]   MEDICAL RECORD NO.:  [id number]          PATIENT TYPE:  INP   LOCATION:  2034                         FACILITY:  [facility]   PHYSICIAN:  Darcella Gasman. [place], N.P.  DATE OF BIRTH:  1961-10-27   DATE OF ADMISSION:  04/17/2006  DATE OF DISCHARGE:  04/21/2006                                HISTORY & PHYSICAL   CHIEF COMPLAINT:  Chest pain, feeling very weak and sick.   HISTORY OF PRESENT ILLNESS:  This 50 year old white male had been in his  usual state of health and then several days prior to this admission he did  have an episode, which sounded like a TIA and does have headaches, but he  had been in more his normal state of health and developed chest pain,  feeling terrible, called EMS, and they found him with a blood pressure of  70/50, heart rate 48, respirations 20, oxygen-SAT 99% on O2.  It was  difficult to get any history.  He felt so bad he could not really speak, but  he did complain of chest discomfort.  He was in bed when they arrived.  They  brought him to [facility].  He was emergently taken to the cath  lab.  He was found to be in what appeared to be complete heart block.  A  temporary pacemaker was inserted as well as a cardiac cath due to ST  elevations in inferior and anterior leads.  Cath showed 100% RCA stenosis.  He underwent PTCA and stent deployment x2 drug-eluting stents per Dr. Allyson Sabal.   PAST MEDICAL HISTORY:  Essentially negative.  He had not seen a doctor for  some time.   OUTPATIENT MEDICATIONS:  None.   ALLERGIES:  None.   FAMILY HISTORY:  Positive for stroke and diabetes.   SOCIAL HISTORY:  He is married, lives with his wife, works for a [employer] for [employer], does smoke half a pack a day, no alcohol, no drug use.   REVIEW OF SYSTEMS:  GENERAL:  Extreme fatigue on arrival; prior to that,  none.  NEURO:  No syncope.  RESPIRATIONS:  No shortness of breath.  HEENT:  Negative for colds  though he did have a headache.  GI:  No nausea, no  vomiting, no abdominal pain, no diarrhea, no constipation.  GU:  No  hematuria or dysuria.  MUSCULOSKELETAL:  Negative.  ENDOCRINE:  No diabetes  or frequent urination, no thyroid disease he is aware of, no night sweats.  Difficult to get history secondary to patient's feeling severely ill.   On exam, blood pressure 88/60, oxygen-SAT 95%.  He was given boluses of IV  fluids for his severe hypotension.  He was given Atropine 1 mg IV x3.  A  Dopamine drip was started on him as well as a heparin drip.  He was taken  emergently to the lab.  Chest x-ray revealed no acute problems.   LABORATORY DATA:  Sodium 141, potassium 3.4, chloride 106, BUN 20, glucose  179, hemoglobin 16, hematocrit 48, creatinine 1.6.  EKG:  ST elevation  in 2,  3, aVF and V-2 through 6.   PHYSICAL EXAMINATION:  GENERAL:  Oriented, very ill in appearance white  male, bradycardic, alert but groggy.  HEENT:  Sclerae are clear.  NECK:  Supple without JVD.  LUNGS:  Clear.  HEART SOUNDS:  S1 and S2, slow but regular.  MENTAL STATUS:  Normal.  SKIN:  Pale and diaphoretic, capillary refill was less than two seconds,  normal turgor.  LOWER EXTREMITIES:  Without edema.  Pulses are present.   ASSESSMENT:  1.  Acute inferior, possible anterior myocardial infarction.  2.  History of tobacco use.  3.  Hypotension.  4.  Complete heart block.   PLAN:  Emergently to cath lab for temporary pacer, continue IV dopamine  drip, use Atropine as a  p.r.n., and undergo rescue cardiac cath and  angioplasties if needed.  Dr. Allyson Sabal was with the patient.      Darcella Gasman. Annie Paras, N.P.     LRI/MEDQ  D:  04/21/2006  T:  04/21/2006  Job:  [id number]

## 2011-03-16 NOTE — Consult Note (Signed)
NAME:  Chris [name], Chris [name] NO.:  [id number]   MEDICAL RECORD NO.:  [id number]          PATIENT TYPE:  INP   LOCATION:  2905                         FACILITY:  [facility]   PHYSICIAN:  Deanna Artis. Hickling, M.D.DATE OF BIRTH:  10-19-1961   DATE OF CONSULTATION:  04/19/2006  DATE OF DISCHARGE:                                   CONSULTATION   CHIEF COMPLAINT:  TIA event.   HISTORY OF PRESENT CONDITION:  Patient is a 50 year old gentleman who had  onset of right arm drooping and right facial weakness about two weeks ago on  Friday.  He was driving his truck.  He suddenly lost use of the arm.  He  pulled over to the side, and the symptoms subsided after about five minutes.  He was able to resume driving.  He claims that he was extremely tired,  having worked for several days with very little, if any, sleep.   The patient's history is somewhat murky.  I have the impression from  speaking with the nurses that he had other episodes, but he cannot give any  specifics.   On the day he was admitted to the hospital, the patient had two separate  syncopal episodes.  He had been out in the yard, although he had not been  working very hard.  He was sitting on the bed, got up to go to the bathroom,  and as he came back, he collapsed to the floor.  Eyes rolled up, saliva came  from his mouth, but he did not have any jerking.  His son got him up off the  floor, and the patient went right back out.  When EMS arrived, they found  that his heart rate was 40.  He was brought to [facility], where he  was noted to have an acute myocardial infarction.  He was treated with  Atropine, heparin, dopamine, and was taken to the catheterization lab, where  a tight stenosis was found by Dr. Coralee Pesa.  He placed two stents that  opened up a blockage from 100% to 0%.  The patient's ejection fraction was  noted to be 40-45% with moderate hypokinesis of the diaphragm.  We do not  know if that was  an acute finding or a chronic one.  The patient never  demonstrated atrial fibrillation.  He is currently on aspirin and Plavix.   As a result of these symptoms prior to this hospitalization, neurology was  asked to see him give an opinion as to what further workup should be done  for an apparent right brain TIA.  The patient had a CT scan of the brain  with and without contrast, which I have reviewed, which is normal, other  than some sinusitis that is minimal.  He cannot have an MRI scan at this  time because of his recent stent placement.   PAST MEDICAL HISTORY:  We are unaware of problems with hypertension,  diabetes, dyslipidemia.  Patient is not obese.   FAMILY HISTORY:  Unremarkable for stroke.  There is a history of  atherosclerotic cardiovascular disease.  REVIEW OF SYSTEMS:  The patient may have diabetes.  He has had problems with  headaches.  When he arrived in the emergency room, he was quite hypotensive  with blood pressures in the 76-88/50-60 range.  He had been favoring the  left arm that day at work.  It is unclear if he was having pain in it or  whether it was truly weak.   SOCIAL HISTORY:  Patient smokes a pack of cigarettes a day.  He does not use  alcohol.  He is married.   PAST MEDICATIONS:  None.   DRUG ALLERGIES:  None known.   CURRENT MEDICATIONS:  1.  Lopressor 25 mg twice daily.  2.  Vytorin 10/20 1 daily.  3.  Aspirin 325 mg daily.  4.  Plavix 75 mg daily.  5.  P.r.n. medicines, including morphine, Zofran, Nubain, Integrilin,      dopamine.   PHYSICAL EXAMINATION:  VITAL SIGNS:  Temperature 98.5, blood pressure  104/66, resting pulse 74, respirations 16.  Oxygen saturation 96% on room  air .  GENERAL:  On examination today, this is a pleasant gentleman, at times with  inappropriate affect.  He was walking around the unit and started to walk  into a patient's room until the nurse stopped him.  HEENT:  No signs of infection.  NECK:  No bruits.   Supple neck.  LUNGS:  Clear.  HEART:  No murmurs.  Pulses normal.  ABDOMEN:  Soft.  Bowel sounds normal.  No hepatosplenomegaly.  EXTREMITIES:  Unremarkable.  NEUROLOGIC:  Mental status:  Patient is awake and alert.  No dysphagia.  Cranial nerves:  Round, reactive pupils.  Visual fields full.  Extraocular  movements are full.  Symmetric facial strength.  Midline tongue.  Ear  conduction greater than bone conduction bilaterally.  Motor examination:  Normal strength.  No drift.  Good fine motor movement sensation.  Intact  cold, vibration, stereoagnosis, and proprioception.  Cerebellar examination:  Good finger to nose, rapid repetitive movements.  Gait and station normal.  Deep tendon reflexes diminished.  Patient had bilateral flexor and plantar  responses.   IMPRESSION:  1.  Right brain transient ischemic attack.  435.8.  2.  Syncope.  780.2.  likely related to a myocardial infarction   PLAN:  Patient will have a 2D echocardiogram to look at the wall hypokinesis  and ejection fraction.  This will be read by [facility].  He will also have a carotid Doppler study. We will have blood  drawn for hemoglobin A1C.  Fasting lipid panel and serum homocysteine.  He  is already being treated for the dyslipidemia (if it exists).  We have  written for smoking cessation intervention.  I have talked about it myself  with the patient.   I appreciate the opportunity to participate in his care.  We will see him in  followup, but it will be fine for him to be discharged once the carotid  Doppler and 2D echocardiogram are done and the bloods are done tomorrow  morning.      Deanna Artis. Sharene Skeans, M.D.  Electronically Signed     [facility]  D:  04/19/2006  T:  04/19/2006  Job:  [id number]   cc:   Darlin Priestly, MD  Fax: [phone]

## 2011-07-23 LAB — URINALYSIS, ROUTINE W REFLEX MICROSCOPIC
Glucose, UA: 100 — AB
Hgb urine dipstick: NEGATIVE
Ketones, ur: 15 — AB
Nitrite: NEGATIVE
Protein, ur: NEGATIVE
Specific Gravity, Urine: 1.024
Urobilinogen, UA: 1
pH: 6.5

## 2011-07-23 LAB — POCT I-STAT, CHEM 8
BUN: 22
Calcium, Ion: 1.11 — ABNORMAL LOW
Chloride: 106
Creatinine, Ser: 1.2
Glucose, Bld: 100 — ABNORMAL HIGH
HCT: 48
Hemoglobin: 16.3
Potassium: 3.9
Sodium: 140
TCO2: 25

## 2011-07-23 LAB — POCT CARDIAC MARKERS
CKMB, poc: <1 — ABNORMAL LOW
Myoglobin, poc: 148
Operator id: 285841
Troponin i, poc: <0.05

## 2011-07-23 LAB — CBC
HCT: 45.2
Hemoglobin: 15.5
MCHC: 34.4
MCV: 90
Platelets: 211
RBC: 5.01
RDW: 13.9
WBC: 11.9 — ABNORMAL HIGH

## 2011-07-23 LAB — DIFFERENTIAL
Basophils Absolute: 0
Basophils Relative: 0
Eosinophils Absolute: 0
Eosinophils Relative: 0
Lymphocytes Relative: 6 — ABNORMAL LOW
Lymphs Abs: 0.7
Monocytes Absolute: 1
Monocytes Relative: 8
Neutro Abs: 10.2 — ABNORMAL HIGH
Neutrophils Relative %: 86 — ABNORMAL HIGH

## 2011-07-23 LAB — RAPID URINE DRUG SCREEN, HOSP PERFORMED
Amphetamines: NOT DETECTED
Barbiturates: NOT DETECTED
Benzodiazepines: NOT DETECTED
Cocaine: POSITIVE — AB
Opiates: NOT DETECTED
Tetrahydrocannabinol: NOT DETECTED

## 2011-07-23 LAB — PROTIME-INR
INR: 1
Prothrombin Time: 13.2

## 2011-07-23 LAB — APTT: aPTT: 28

## 2011-07-23 LAB — ETHANOL: Alcohol, Ethyl (B): <5

## 2012-10-04 HISTORY — PX: WRIST SURGERY: SHX841

## 2013-06-28 ENCOUNTER — Other Ambulatory Visit: Payer: Self-pay | Admitting: Cardiovascular Disease

## 2013-06-30 NOTE — Telephone Encounter (Signed)
Rx was sent to pharmacy electronically. 

## 2013-09-01 ENCOUNTER — Other Ambulatory Visit: Payer: Self-pay | Admitting: Cardiovascular Disease

## 2013-09-08 ENCOUNTER — Other Ambulatory Visit: Payer: Self-pay | Admitting: Cardiovascular Disease

## 2013-09-08 NOTE — Telephone Encounter (Signed)
Rx was sent to pharmacy electronically. 

## 2013-10-05 ENCOUNTER — Other Ambulatory Visit: Payer: Self-pay | Admitting: Cardiovascular Disease

## 2013-10-05 ENCOUNTER — Telehealth: Payer: Self-pay | Admitting: *Deleted

## 2013-10-05 NOTE — Telephone Encounter (Signed)
Pt needs a refill on Plavix. She stated the pharmacy doesn't have any refills and he has a doctor appointment on Dec 30th.

## 2013-10-12 ENCOUNTER — Other Ambulatory Visit: Payer: Self-pay | Admitting: Cardiovascular Disease

## 2013-10-12 NOTE — Telephone Encounter (Signed)
Rx was sent to pharmacy electronically. 

## 2013-10-27 ENCOUNTER — Encounter: Payer: Self-pay | Admitting: Cardiovascular Disease

## 2013-10-27 ENCOUNTER — Ambulatory Visit (INDEPENDENT_AMBULATORY_CARE_PROVIDER_SITE_OTHER): Payer: Self-pay | Admitting: Cardiovascular Disease

## 2013-10-27 VITALS — BP 140/88 | HR 73 | Ht 70.0 in | Wt 232.0 lb

## 2013-10-27 DIAGNOSIS — Z79899 Other long term (current) drug therapy: Secondary | ICD-10-CM

## 2013-10-27 DIAGNOSIS — R5381 Other malaise: Secondary | ICD-10-CM

## 2013-10-27 DIAGNOSIS — R5383 Other fatigue: Secondary | ICD-10-CM

## 2013-10-27 DIAGNOSIS — I2510 Atherosclerotic heart disease of native coronary artery without angina pectoris: Secondary | ICD-10-CM

## 2013-10-27 DIAGNOSIS — R3911 Hesitancy of micturition: Secondary | ICD-10-CM

## 2013-10-27 DIAGNOSIS — E785 Hyperlipidemia, unspecified: Secondary | ICD-10-CM

## 2013-10-27 MED ORDER — CLOPIDOGREL BISULFATE 75 MG PO TABS: ORAL_TABLET | ORAL | Status: DC

## 2013-10-27 MED ORDER — METOPROLOL TARTRATE 25 MG PO TABS: ORAL_TABLET | ORAL | Status: DC

## 2013-10-27 NOTE — Progress Notes (Signed)
10/27/2013 Chris [name]   1961-10-25  [id number]  Primary Physician No PCP Per Patient Primary Cardiologist: Runell Gess MD [name]   HPI:  The patient is a very pleasant 52 year old mildly overweight married Caucasian male, father of 1 child, whom I saw a year ago. He has a history of CAD, status post acute inferior wall myocardial infarction with RV infarct physiology on April 17, 2006. I took him to the cath lab at midnight that day and opened up an occluded RCA with overlapping Cypher drug-eluting stents. Circumflex and LAD were free of significant disease. His EF was 40% to 45% with moderate inferior hypokinesia. Subsequent echocardiogram revealed normal LV function with borderline inferior hypokinesia, and a recent Myoview performed 1 year ago was not ischemic. His other problems include hyperlipidemia and continued tobacco abuse of 1/2 pack per day. Denies chest pain or shortness of breath but does complain of fatigue. Since I saw him July 2013 he has been asymptomatic.     Current Outpatient Prescriptions  Medication Sig Dispense Refill  . aspirin 81 MG tablet Take 81 mg by mouth daily.      . clopidogrel (PLAVIX) 75 MG tablet TAKE ONE TABLET BY MOUTH ONE TIME DAILY   30 tablet  0  . ezetimibe-simvastatin (VYTORIN) 10-20 MG per tablet Take 1 tablet by mouth daily.      . metoprolol tartrate (LOPRESSOR) 25 MG tablet TAKE ONE TABLET BY MOUTH ONE TIME DAILY   15 tablet  0  . niacin 500 MG tablet Take 1,000 mg by mouth at bedtime.      Marland Kitchen omeprazole (PRILOSEC) 20 MG capsule Take 20 mg by mouth daily.       No current facility-administered medications for this visit.    No Known Allergies  History   Social History  . Marital Status: Married    Spouse Name: N/A    Number of Children: N/A  . Years of Education: N/A   Occupational History  . Not on file.   Social History Main Topics  . Smoking status: Current Some Day Smoker  . Smokeless tobacco:  Not on file  . Alcohol Use: Not on file  . Drug Use: Not on file  . Sexual Activity: Not on file   Other Topics Concern  . Not on file   Social History Narrative  . No narrative on file     Review of Systems: General: negative for chills, fever, night sweats or weight changes.  Cardiovascular: negative for chest pain, dyspnea on exertion, edema, orthopnea, palpitations, paroxysmal nocturnal dyspnea or shortness of breath Dermatological: negative for rash Respiratory: negative for cough or wheezing Urologic: negative for hematuria Abdominal: negative for nausea, vomiting, diarrhea, bright red blood per rectum, melena, or hematemesis Neurologic: negative for visual changes, syncope, or dizziness All other systems reviewed and are otherwise negative except as noted above.    Blood pressure 140/88, pulse 73, height 5\' 10"  (1.778 m), weight 232 lb (105.235 kg).  General appearance: alert and no distress Neck: no adenopathy, no carotid bruit, no JVD, supple, symmetrical, trachea midline and thyroid not enlarged, symmetric, no tenderness/mass/nodules Lungs: clear to auscultation bilaterally Heart: regular rate and rhythm, S1, S2 normal, no murmur, click, rub or gallop Extremities: extremities normal, atraumatic, no cyanosis or edema  EKG normal sinus rhythm at 73 with inferior Q waves and early R-wave transition with incomplete right bundle branch block  ASSESSMENT AND PLAN:   Hyperlipidemia On statin therapy. We will  recheck a lipid and liver profile  Coronary artery disease Status post acute inferior wall myocardial infarction with RV infarct physiology 04/17/06. He had an occluded RCA which I stented using overlapping Cypher drug-eluting stents. His LAD and circumflex were free of significant disease. His EF is 40-45% with moderate inferior hypokinesia and subsequent echo performed 06/14/06 revealed an EF of 50-55% his last Myoview performed 04/19/11 showed no ischemia. He denies  chest pain or shortness of breath.      Runell Gess MD FACP,FACC,FAHA, [facility] 10/27/2013 3:09 PM

## 2013-10-27 NOTE — Patient Instructions (Signed)
Your physician wants you to follow-up in: 1 year with Dr Allyson Sabal. You will receive a reminder letter in the mail two months in advance. If you don't receive a letter, please call our office to schedule the follow-up appointment.   Dr Allyson Sabal wants you to have some blood work done, fasting.

## 2013-10-27 NOTE — Assessment & Plan Note (Signed)
Status post acute inferior wall myocardial infarction with RV infarct physiology 04/17/06. He had an occluded RCA which I stented using overlapping Cypher drug-eluting stents. His LAD and circumflex were free of significant disease. His EF is 40-45% with moderate inferior hypokinesia and subsequent echo performed 06/14/06 revealed an EF of 50-55% his last Myoview performed 04/19/11 showed no ischemia. He denies chest pain or shortness of breath.

## 2013-10-27 NOTE — Assessment & Plan Note (Signed)
On statin therapy. We will recheck a lipid and liver profile 

## 2014-02-18 ENCOUNTER — Other Ambulatory Visit: Payer: Self-pay

## 2014-02-18 MED ORDER — CLOPIDOGREL BISULFATE 75 MG PO TABS: ORAL_TABLET | ORAL | Status: DC

## 2014-02-18 NOTE — Telephone Encounter (Signed)
Rx was sent to pharmacy electronically. 

## 2014-02-18 NOTE — Telephone Encounter (Addendum)
Addendum opened in error

## 2014-11-29 ENCOUNTER — Other Ambulatory Visit: Payer: Self-pay | Admitting: Cardiovascular Disease

## 2014-12-01 ENCOUNTER — Other Ambulatory Visit: Payer: Self-pay

## 2014-12-01 MED ORDER — METOPROLOL TARTRATE 25 MG PO TABS: ORAL_TABLET | ORAL | Status: DC

## 2014-12-03 NOTE — Telephone Encounter (Signed)
Metoprolol refilled 12/01/14 - patient needs OV

## 2014-12-28 ENCOUNTER — Other Ambulatory Visit: Payer: Self-pay | Admitting: Cardiovascular Disease

## 2014-12-30 ENCOUNTER — Other Ambulatory Visit: Payer: Self-pay

## 2014-12-30 MED ORDER — METOPROLOL TARTRATE 25 MG PO TABS: 25.0000 mg | ORAL_TABLET | Freq: Every day | ORAL | Status: DC

## 2014-12-30 NOTE — Telephone Encounter (Signed)
Rx(s) sent to pharmacy electronically.  

## 2015-02-22 ENCOUNTER — Telehealth: Payer: Self-pay | Admitting: Cardiovascular Disease

## 2015-02-22 NOTE — Telephone Encounter (Signed)
OK to stop plavix for dental procedure but it takes 5-7 days to wash out

## 2015-02-22 NOTE — Telephone Encounter (Signed)
Information given to wife. routed information to Dr Laurelyn Sickle - [facility] phone [phone], fax [phone] Wife states patient has an appointment next Tuesday. Patient will start tomorrow holding PLAVIX.

## 2015-02-22 NOTE — Telephone Encounter (Signed)
Spoke with wife  She states patient has an appointment today- per wife, a tooth abscess  develop yesterday. She states patient took plavix this morning. Wanted to know if plavix needs to be held for tooth extraction.and if so how long? RN informed wife will defer to Dr Gwenlyn Found and contact her with information.

## 2015-02-22 NOTE — Telephone Encounter (Signed)
Pt is going to have a tooth extracted. Can he stop his Plavix and if so for how many days?

## 2015-03-20 ENCOUNTER — Other Ambulatory Visit: Payer: Self-pay | Admitting: Cardiovascular Disease

## 2015-03-21 NOTE — Telephone Encounter (Signed)
Rx has been sent to the pharmacy electronically. ° °

## 2015-05-19 ENCOUNTER — Other Ambulatory Visit: Payer: Self-pay | Admitting: Cardiovascular Disease

## 2015-05-19 ENCOUNTER — Telehealth: Payer: Self-pay | Admitting: Cardiovascular Disease

## 2015-05-19 NOTE — Telephone Encounter (Signed)
Rx(s) sent to pharmacy electronically. Staff message sent to [facility], Dr. Kennon Holter scheduler, to contact patient for office visit

## 2015-05-19 NOTE — Telephone Encounter (Signed)
Closed encounter °

## 2015-06-13 ENCOUNTER — Telehealth: Payer: Self-pay | Admitting: Cardiovascular Disease

## 2015-06-13 ENCOUNTER — Other Ambulatory Visit: Payer: Self-pay | Admitting: Cardiovascular Disease

## 2015-06-13 NOTE — Telephone Encounter (Signed)
Rx(s) sent to pharmacy electronically.  

## 2015-06-13 NOTE — Telephone Encounter (Signed)
Closed encounter °

## 2015-12-12 ENCOUNTER — Emergency Department (HOSPITAL_COMMUNITY)
Admission: EM | Admit: 2015-12-12 | Discharge: 2015-12-12 | Disposition: A | Discharge status: Home / Self Care | Payer: BLUE CROSS/BLUE SHIELD | Attending: Emergency Medicine | Admitting: Emergency Medicine

## 2015-12-12 ENCOUNTER — Emergency Department (HOSPITAL_COMMUNITY): Payer: BLUE CROSS/BLUE SHIELD

## 2015-12-12 ENCOUNTER — Encounter (HOSPITAL_COMMUNITY): Payer: Self-pay | Admitting: *Deleted

## 2015-12-12 DIAGNOSIS — Z79899 Other long term (current) drug therapy: Secondary | ICD-10-CM | POA: Insufficient documentation

## 2015-12-12 DIAGNOSIS — Z7902 Long term (current) use of antithrombotics/antiplatelets: Secondary | ICD-10-CM | POA: Diagnosis not present

## 2015-12-12 DIAGNOSIS — R519 Headache, unspecified: Secondary | ICD-10-CM

## 2015-12-12 DIAGNOSIS — Z7982 Long term (current) use of aspirin: Secondary | ICD-10-CM | POA: Diagnosis not present

## 2015-12-12 DIAGNOSIS — F17200 Nicotine dependence, unspecified, uncomplicated: Secondary | ICD-10-CM | POA: Insufficient documentation

## 2015-12-12 DIAGNOSIS — R4182 Altered mental status, unspecified: Secondary | ICD-10-CM | POA: Diagnosis not present

## 2015-12-12 DIAGNOSIS — I2510 Atherosclerotic heart disease of native coronary artery without angina pectoris: Secondary | ICD-10-CM | POA: Insufficient documentation

## 2015-12-12 DIAGNOSIS — R51 Headache: Secondary | ICD-10-CM | POA: Diagnosis present

## 2015-12-12 DIAGNOSIS — R61 Generalized hyperhidrosis: Secondary | ICD-10-CM

## 2015-12-12 DIAGNOSIS — E785 Hyperlipidemia, unspecified: Secondary | ICD-10-CM | POA: Insufficient documentation

## 2015-12-12 LAB — COMPREHENSIVE METABOLIC PANEL
ALT: 26 U/L (ref 17–63)
AST: 20 U/L (ref 15–41)
Albumin: 3.9 g/dL (ref 3.5–5.0)
Alkaline Phosphatase: 67 U/L (ref 38–126)
Anion gap: 13 (ref 5–15)
BUN: 13 mg/dL (ref 6–20)
CO2: 21 mmol/L — ABNORMAL LOW (ref 22–32)
Calcium: 9.2 mg/dL (ref 8.9–10.3)
Chloride: 105 mmol/L (ref 101–111)
Creatinine, Ser: 0.95 mg/dL (ref 0.61–1.24)
GFR calc Af Amer: >60 mL/min (ref >60)
GFR calc non Af Amer: >60 mL/min (ref >60)
Glucose, Bld: 109 mg/dL — ABNORMAL HIGH (ref 65–99)
Potassium: 3.9 mmol/L (ref 3.5–5.1)
Sodium: 139 mmol/L (ref 135–145)
Total Bilirubin: 0.5 mg/dL (ref 0.3–1.2)
Total Protein: 7 g/dL (ref 6.5–8.1)

## 2015-12-12 LAB — DIFFERENTIAL
Basophils Absolute: 0 10*3/uL (ref 0.0–0.1)
Basophils Relative: 0 %
Eosinophils Absolute: 0.1 10*3/uL (ref 0.0–0.7)
Eosinophils Relative: 1 %
Lymphocytes Relative: 29 %
Lymphs Abs: 2 10*3/uL (ref 0.7–4.0)
Monocytes Absolute: 0.7 10*3/uL (ref 0.1–1.0)
Monocytes Relative: 11 %
Neutro Abs: 4 10*3/uL (ref 1.7–7.7)
Neutrophils Relative %: 59 %

## 2015-12-12 LAB — CBC
HCT: 48.8 % (ref 39.0–52.0)
Hemoglobin: 17 g/dL (ref 13.0–17.0)
MCH: 31 pg (ref 26.0–34.0)
MCHC: 34.8 g/dL (ref 30.0–36.0)
MCV: 89.1 fL (ref 78.0–100.0)
Platelets: 227 10*3/uL (ref 150–400)
RBC: 5.48 MIL/uL (ref 4.22–5.81)
RDW: 13.7 % (ref 11.5–15.5)
WBC: 6.7 10*3/uL (ref 4.0–10.5)

## 2015-12-12 LAB — I-STAT CHEM 8, ED
BUN: 15 mg/dL (ref 6–20)
Calcium, Ion: 1.14 mmol/L (ref 1.12–1.23)
Chloride: 104 mmol/L (ref 101–111)
Creatinine, Ser: 0.9 mg/dL (ref 0.61–1.24)
Glucose, Bld: 103 mg/dL — ABNORMAL HIGH (ref 65–99)
HCT: 53 % — ABNORMAL HIGH (ref 39.0–52.0)
Hemoglobin: 18 g/dL — ABNORMAL HIGH (ref 13.0–17.0)
Potassium: 3.9 mmol/L (ref 3.5–5.1)
Sodium: 141 mmol/L (ref 135–145)
TCO2: 24 mmol/L (ref 0–100)

## 2015-12-12 LAB — CBG MONITORING, ED: Glucose-Capillary: 91 mg/dL (ref 65–99)

## 2015-12-12 LAB — APTT: aPTT: 31 seconds (ref 24–37)

## 2015-12-12 LAB — PROTIME-INR
INR: 0.98 (ref 0.00–1.49)
Prothrombin Time: 13.2 seconds (ref 11.6–15.2)

## 2015-12-12 LAB — I-STAT TROPONIN, ED
Troponin i, poc: 0 ng/mL (ref 0.00–0.08)
Troponin i, poc: 0 ng/mL (ref 0.00–0.08)

## 2015-12-12 IMAGING — CT CT HEAD W/O CM
2 series · 15 of 30 positions shown, 17 images · non-contrast
Comparison: Head CT scan [DATE].

CLINICAL DATA: Altered mental status and headache today. Initial
encounter.

EXAM:
CT HEAD WITHOUT CONTRAST
TECHNIQUE: Contiguous axial images were obtained from the base of the skull
through the vertex without intravenous contrast.

[Series 2: head without · axial · non-contrast · 0.44mm/px · z∈[-153,-28]mm · 7 of 35 slices shown, 9 images]
[im 5/35  brain]
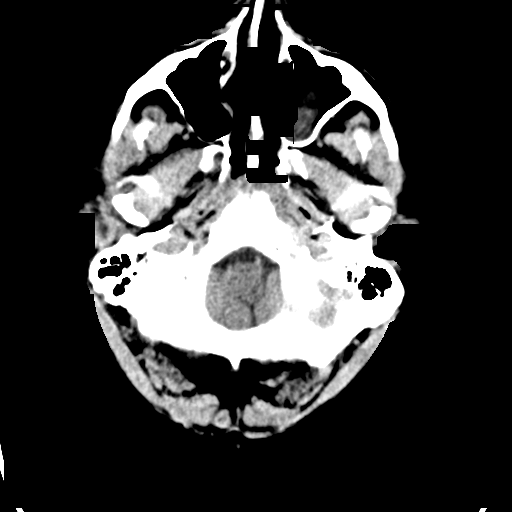
[im 5/35  bone]
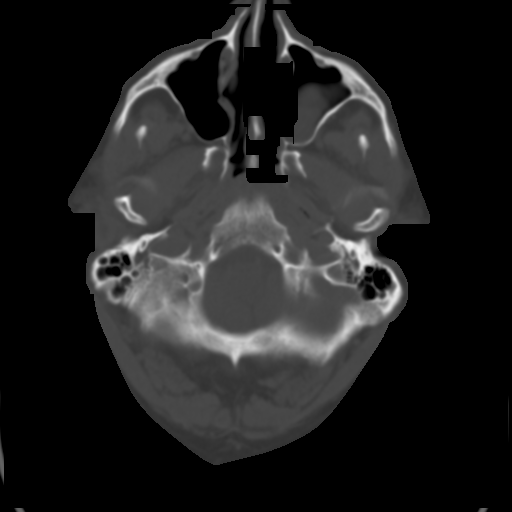
[im 9/35  brain]
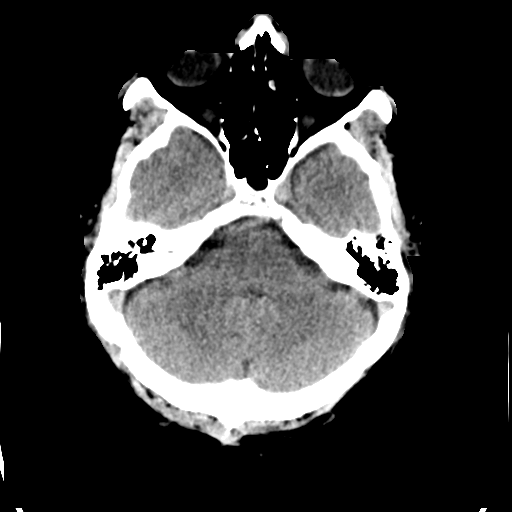
[im 13/35  brain]
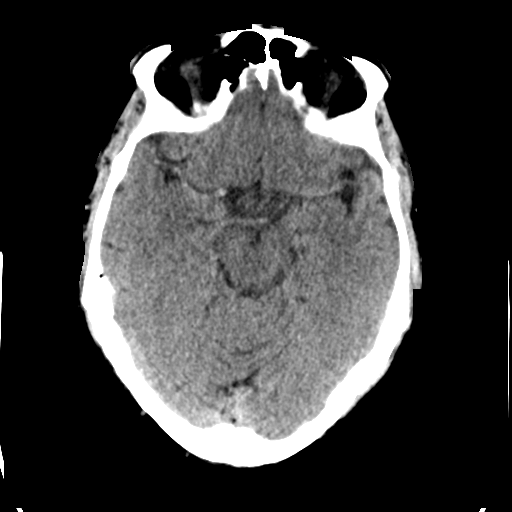
[im 18/35  brain]
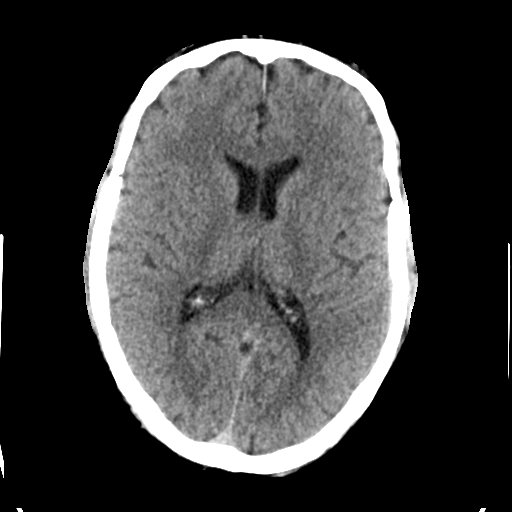
[im 22/35  brain]
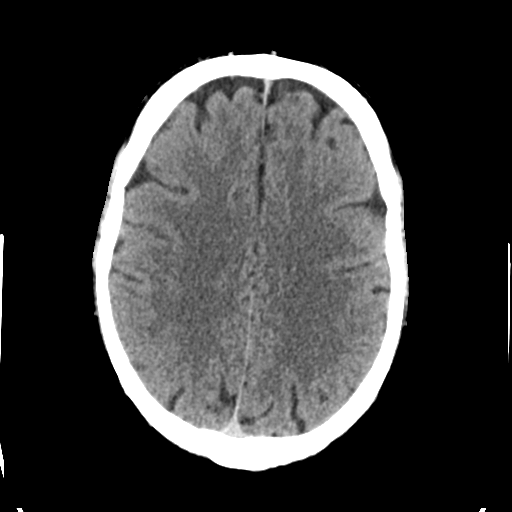
[im 22/35  bone]
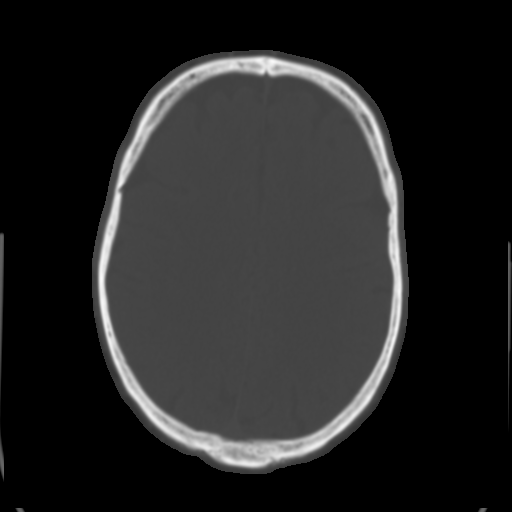
[im 26/35  brain]
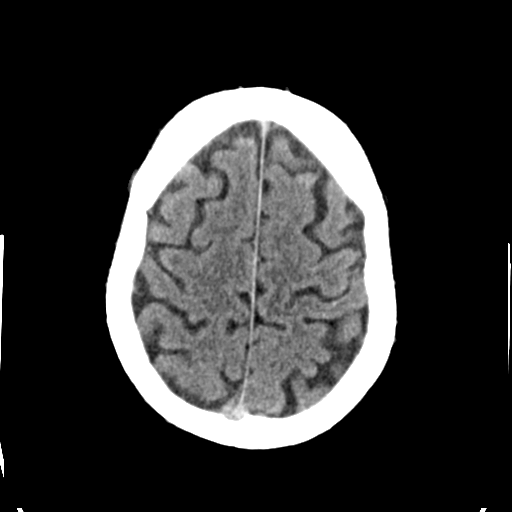
[im 30/35  brain]
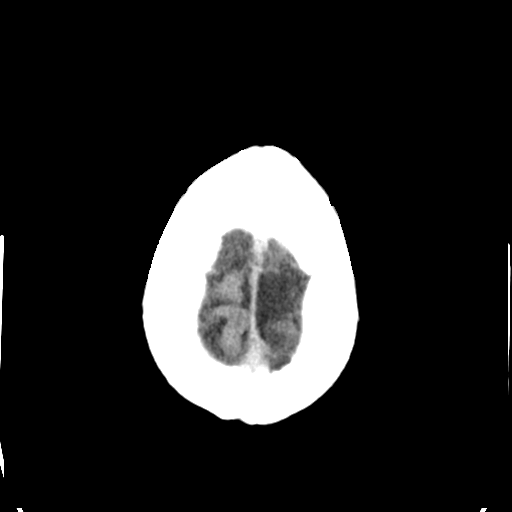

[Series 3: head bone · axial · 0.44mm/px · z∈[-157,-19]mm · 8 of 87 slices shown]
[im 9/87  bone]
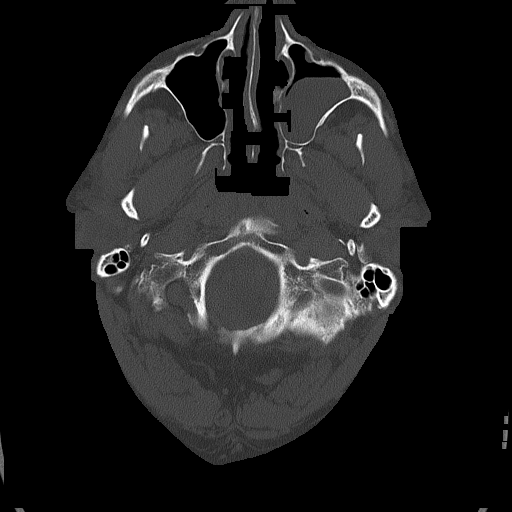
[im 18/87  bone]
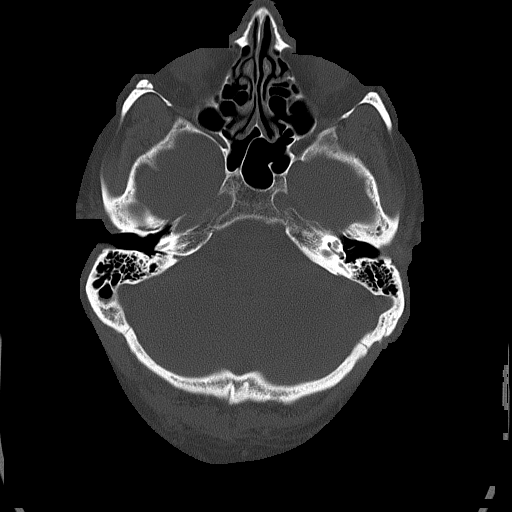
[im 26/87  bone]
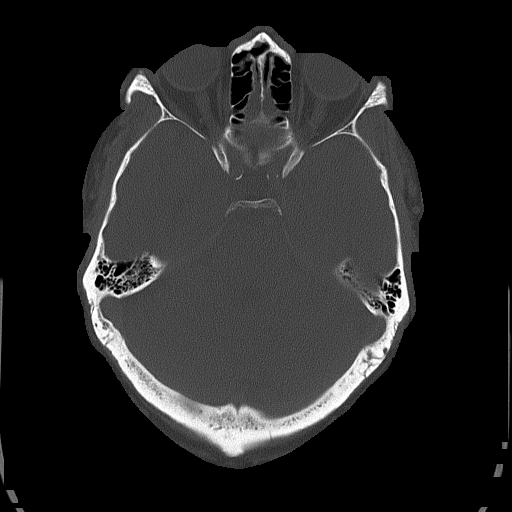
[im 39/87  bone]
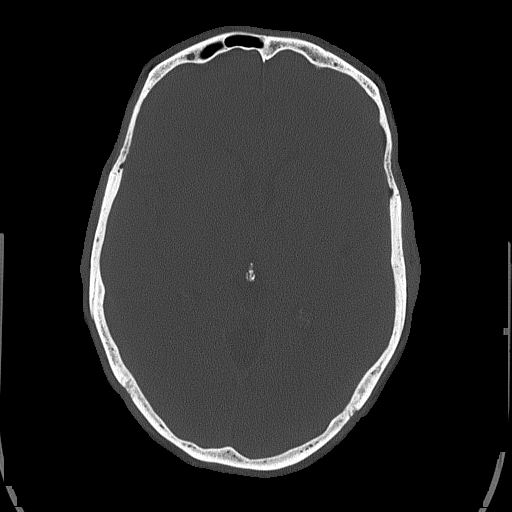
[im 48/87  bone]
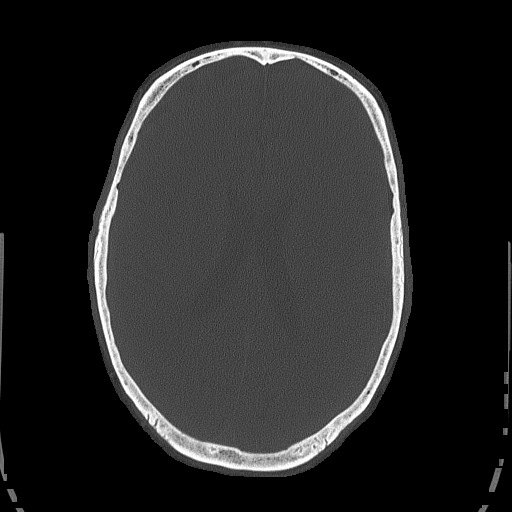
[im 61/87  bone]
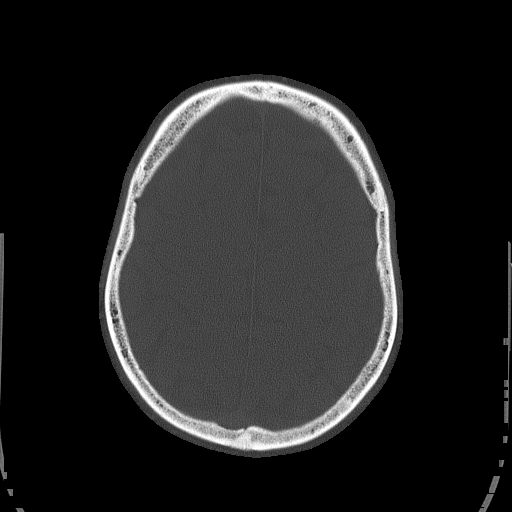
[im 69/87  bone]
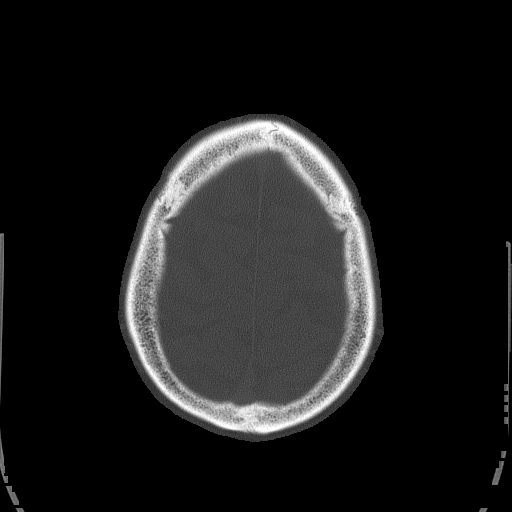
[im 78/87  bone]
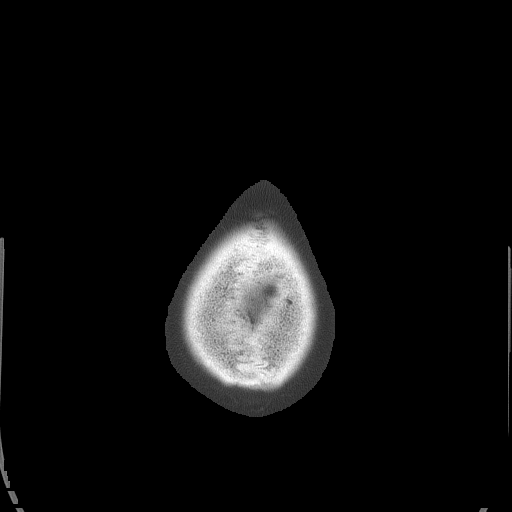

[15 of 30 positions shown; findings below may reference images not displayed]

FINDINGS: The brain appears normal without hemorrhage, infarct, mass lesion,
mass effect, midline shift or abnormal extra-axial fluid collection.
No hydrocephalus or pneumocephalus. Large mucous retention cyst or
polyp in the left maxillary sinus is noted. Imaged paranasal sinuses
and mastoid air cells are otherwise clear. The calvarium is intact.
IMPRESSION: No acute intracranial abnormality.

Large mucous retention cyst or polyp left maxillary sinus.

## 2015-12-12 NOTE — ED Provider Notes (Signed)
CSN: [id number]     Arrival date & time 12/12/15  0844 History   First MD Initiated Contact with Patient 12/12/15 0945     Chief Complaint  Patient presents with  . Headache  . Altered Mental Status      HPI Patient presents emergency department complaining of mild headache.  He reports that he found his blood pressure be high and was feeling slightly lightheaded this morning.  He denies weakness of his arms or legs.  No chest pain or shortness of breath.  He lso reported intermittent chest tightness without significant shortness of breath.  No active pain at this time  Past Medical History  Diagnosis Date  . Coronary artery disease   . Hyperlipidemia   . Tobacco abuse    Past Surgical History  Procedure Laterality Date  . Cardiac catheterization  2007    left    No family history on file. Social History  Substance Use Topics  . Smoking status: Current Some Day Smoker  . Smokeless tobacco: None  . Alcohol Use: None    Review of Systems  All other systems reviewed and are negative.     Allergies  Review of patient's allergies indicates no known allergies.  Home Medications   Prior to Admission medications   Medication Sig Start Date End Date Taking? Authorizing Provider  aspirin 81 MG tablet Take 81 mg by mouth daily.   Yes Historical Provider, MD  atorvastatin (LIPITOR) 10 MG tablet Take 10 mg by mouth daily. 11/10/15  Yes Historical Provider, MD  clopidogrel (PLAVIX) 75 MG tablet TAKE 1 TABLET (75 MG TOTAL) BY MOUTH DAILY. <PLEASE MAKE APPOINTMENT FOR REFILLS> 06/13/15  Yes Lorretta Harp, MD  metoprolol tartrate (LOPRESSOR) 25 MG tablet Take 1 tablet (25 mg total) by mouth daily. Need appointment before further refills 03/21/15  Yes Lorretta Harp, MD  omeprazole (PRILOSEC) 20 MG capsule Take 20 mg by mouth daily.   Yes Historical Provider, MD  oxyCODONE-acetaminophen (PERCOCET/ROXICET) 5-325 MG tablet Take 1 tablet by mouth every 6 (six) hours as needed. 11/25/15   Yes Historical Provider, MD   BP 123/81 mmHg  Pulse 65  Temp(Src) 97.7 F (36.5 C) (Oral)  Resp 19  SpO2 99% Physical Exam  Constitutional: He is oriented to person, place, and time. He appears well-developed and well-nourished.  HENT:  Head: Normocephalic and atraumatic.  Eyes: EOM are normal.  Neck: Normal range of motion.  Cardiovascular: Normal rate, regular rhythm, normal heart sounds and intact distal pulses.   Pulmonary/Chest: Effort normal and breath sounds normal. No respiratory distress.  Abdominal: Soft. He exhibits no distension. There is no tenderness.  Musculoskeletal: Normal range of motion.  Neurological: He is alert and oriented to person, place, and time.  Skin: Skin is warm and dry.  Psychiatric: He has a normal mood and affect. Judgment normal.  Nursing note and vitals reviewed.   ED Course  Procedures (including critical care time) Labs Review Labs Reviewed  COMPREHENSIVE METABOLIC PANEL - Abnormal; Notable for the following:    CO2 21 (*)    Glucose, Bld 109 (*)    All other components within normal limits  I-STAT CHEM 8, ED - Abnormal; Notable for the following:    Glucose, Bld 103 (*)    Hemoglobin 18.0 (*)    HCT 53.0 (*)    All other components within normal limits  PROTIME-INR  APTT  CBC  DIFFERENTIAL  I-STAT TROPOININ, ED  CBG MONITORING, ED  I-STAT  [place], ED    Imaging Review Ct Head Wo Contrast  12/12/2015  CLINICAL DATA:  Altered mental status and headache today. Initial encounter. EXAM: CT HEAD WITHOUT CONTRAST TECHNIQUE: Contiguous axial images were obtained from the base of the skull through the vertex without intravenous contrast. COMPARISON:  Head CT scan 01/23/2008. FINDINGS: The brain appears normal without hemorrhage, infarct, mass lesion, mass effect, midline shift or abnormal extra-axial fluid collection. No hydrocephalus or pneumocephalus. Large mucous retention cyst or polyp in the left maxillary sinus is noted. Imaged  paranasal sinuses and mastoid air cells are otherwise clear. The calvarium is intact. IMPRESSION: No acute intracranial abnormality. Large mucous retention cyst or polyp left maxillary sinus. Electronically Signed   By: Inge Rise M.D.   On: 12/12/2015 09:57   I have personally reviewed and evaluated these images and lab results as part of my medical decision-making.   EKG Interpretation #1 Date/Time:  Monday December 12 2015 08:51:15 EST Ventricular Rate:  63 PR Interval:  162 QRS Duration: 102 QT Interval:  418 QTC Calculation: 427 R Axis:   57 Text Interpretation:  Normal sinus rhythm Incomplete right bundle branch  block Borderline ECG No significant change was found Confirmed by Pam Vanalstine   MD, Hayden Mabin ([id number]) on 12/12/2015 10:11:50 AM      EKG Interpretation #2  Date/Time:  Monday December 12 2015 12:04:35 EST Ventricular Rate:  60 PR Interval:  159 QRS Duration: 115 QT Interval:  422 QTC Calculation: 422 R Axis:   56 Text Interpretation:  Sinus rhythm Incomplete right bundle branch block Abnormal inferior Q waves No significant change was found Confirmed by Amberlee Garvey  MD, Lennette Bihari ([id number]) on 12/12/2015 12:28:21 PM        MDM   Final diagnoses:  Headache, unspecified headache type  Diaphoresis    Overall well-appearing.  Patient feels much better this time.  Discharge home in good condition.  Doubt PE.  Doubt ACS.  EKG negative 2.  Head CT without abnormality.    Jola Schmidt, MD 12/15/15 [phone]

## 2015-12-12 NOTE — ED Notes (Signed)
Gave pt [place] sandwich, applesauce and Cola, per Dr. Venora Maples. Informed Hannah - RN.

## 2015-12-12 NOTE — ED Notes (Signed)
Pt CBG result was 91. Informed Greg - RN and Jarrett Soho - RN.

## 2015-12-12 NOTE — ED Notes (Addendum)
Pt reports waking with a headache and found his BP to be high. Pt states that he felt lightheaded this morning as well. No neuro deficits in triage.

## 2015-12-12 NOTE — ED Notes (Signed)
EKG completed 0851 in triage.

## 2017-10-03 ENCOUNTER — Other Ambulatory Visit: Payer: Self-pay

## 2017-10-03 ENCOUNTER — Encounter: Payer: Self-pay | Admitting: Urology

## 2017-10-03 ENCOUNTER — Ambulatory Visit: Payer: 59 | Admitting: Urology

## 2017-10-03 VITALS — BP 122/74 | HR 71 | Ht 70.0 in | Wt 230.0 lb

## 2017-10-03 DIAGNOSIS — R972 Elevated prostate specific antigen [PSA]: Secondary | ICD-10-CM | POA: Diagnosis not present

## 2017-10-03 LAB — URINALYSIS, COMPLETE
Bilirubin, UA: NEGATIVE
Glucose, UA: NEGATIVE
Ketones, UA: NEGATIVE
Leukocytes, UA: NEGATIVE
Nitrite, UA: NEGATIVE
RBC, UA: NEGATIVE
Specific Gravity, UA: >1.03 — ABNORMAL HIGH (ref 1.005–1.030)
Urobilinogen, Ur: 1 mg/dL (ref 0.2–1.0)
pH, UA: 5.5 (ref 5.0–7.5)

## 2017-10-03 LAB — MICROSCOPIC EXAMINATION
Epithelial Cells (non renal): NONE SEEN /hpf (ref 0–10)
RBC, UA: NONE SEEN /hpf (ref 0–?)
WBC, UA: NONE SEEN /hpf (ref 0–?)

## 2017-10-03 MED ORDER — TAMSULOSIN HCL 0.4 MG PO CAPS: 0.4000 mg | ORAL_CAPSULE | Freq: Every day | ORAL | 0 refills | Status: DC

## 2017-10-03 NOTE — Progress Notes (Signed)
10/03/2017 1:51 PM   [name] February 20, 1961 [id number]  Referring provider: Casilda Carls, [place], [place] [address]  Chief Complaint  Patient presents with  . Elevated PSA    New Patient    HPI: Chris [name] is a 56 year old male seen in consultation at the request of Dr. Rosario Jacks for evaluation of an elevated PSA.  A PSA obtained on 09/09/2017 was elevated at 4.8 ng/dL.  There were no prior PSAs available for comparison at this visit.  He does have mild to moderate lower urinary tract symptoms including urinary hesitancy and intermittency.  He denies dysuria or gross hematuria.  He has no flank, abdominal, pelvic or scrotal pain.  He is on Plavix for prior coronary artery stent placement.  There is no family history of prostate cancer.   PMH: Past Medical History:  Diagnosis Date  . Coronary artery disease   . GERD (gastroesophageal reflux disease)   . Hyperlipidemia   . MI (myocardial infarction) ([place])   . Tobacco abuse     Surgical History: Past Surgical History:  Procedure Laterality Date  . CARDIAC CATHETERIZATION  2007   left   . WRIST SURGERY  2000    Home Medications:  Allergies as of 10/03/2017   No Known Allergies     Medication List        Accurate as of 10/03/17  1:51 PM. Always use your most recent med list.          aspirin 81 MG tablet Take 81 mg by mouth daily.   atorvastatin 10 MG tablet Commonly known as:  LIPITOR Take 10 mg by mouth daily.   clopidogrel 75 MG tablet Commonly known as:  PLAVIX TAKE 1 TABLET (75 MG TOTAL) BY MOUTH DAILY. <PLEASE MAKE APPOINTMENT FOR REFILLS>   metoprolol tartrate 25 MG tablet Commonly known as:  LOPRESSOR Take 1 tablet (25 mg total) by mouth daily. Need appointment before further refills   omeprazole 20 MG capsule Commonly known as:  PRILOSEC Take 20 mg by mouth daily.   oxyCODONE-acetaminophen 5-325 MG tablet Commonly known as:  PERCOCET/ROXICET Take 1 tablet by mouth  every 6 (six) hours as needed.       Allergies: No Known Allergies  Family History: Family History  Problem Relation Age of Onset  . Prostate cancer Neg Hx   . Kidney cancer Neg Hx     Social History:  reports that he has been smoking.  he has never used smokeless tobacco. He reports that he does not drink alcohol or use drugs.  ROS: UROLOGY Frequent Urination?: No Hard to postpone urination?: No Burning/pain with urination?: No Get up at night to urinate?: No Leakage of urine?: No Urine stream starts and stops?: Yes Trouble starting stream?: Yes Do you have to strain to urinate?: No Blood in urine?: No Urinary tract infection?: No Sexually transmitted disease?: No Injury to kidneys or bladder?: No Painful intercourse?: No Weak stream?: Yes Erection problems?: Yes Penile pain?: Yes  Gastrointestinal Nausea?: No Vomiting?: No Indigestion/heartburn?: No Diarrhea?: No Constipation?: No  Constitutional Fever: No Night sweats?: No Weight loss?: No Fatigue?: Yes  Skin Skin rash/lesions?: No Itching?: No  Eyes Blurred vision?: No Double vision?: No  Ears/Nose/Throat Sore throat?: No Sinus problems?: No  Hematologic/Lymphatic Swollen glands?: No Easy bruising?: No  Cardiovascular Leg swelling?: No Chest pain?: No  Respiratory Cough?: No Shortness of breath?: No  Endocrine Excessive thirst?: No  Musculoskeletal Back pain?: No Joint pain?: No  Neurological  Headaches?: No Dizziness?: No  Psychologic Depression?: No Anxiety?: No  Physical Exam: BP 122/74   Pulse 71   Ht 5\' 10"  (1.778 m)   Wt 230 lb (104.3 kg)   BMI 33.00 kg/m   Constitutional:  Alert and oriented, No acute distress. HEENT: [place] AT, moist mucus membranes.  Trachea midline, no masses. Cardiovascular: No clubbing, cyanosis, or edema. Respiratory: Normal respiratory effort, no increased work of breathing. GI: Abdomen is soft, nontender, nondistended, no abdominal  masses GU: No CVA tenderness.  Prostate 35gm, smooth without n this is aodules. Skin: No rashes, bruises or suspicious lesions. Lymph: No cervical or inguinal adenopathy. Neurologic: Grossly intact, no focal deficits, moving all 4 extremities. Psychiatric: Normal mood and affect.  Laboratory Data: Lab Results  Component Value Date   WBC 6.7 12/12/2015   HGB 18.0 (H) 12/12/2015   HCT 53.0 (H) 12/12/2015   MCV 89.1 12/12/2015   PLT 227 12/12/2015    Lab Results  Component Value Date   CREATININE 0.90 12/12/2015    Urinalysis Dipstick negative Microscopy negative    Assessment & Plan:    1. Elevated PSA Although PSA is a prostate cancer screening test he was informed that cancer is not the most common cause of an elevated PSA. Other potential causes including BPH and inflammation were discussed. He was informed that the only way to adequately diagnose prostate cancer would be a transrectal ultrasound and biopsy of the prostate. The procedure was discussed including potential risks of bleeding and infection/sepsis. He was also informed that a negative biopsy does not conclusively rule out the possibility that prostate cancer may be present and that continued monitoring is required. The use of newer adjunctive blood tests including PHI and 4kScore were discussed. The use of multiparametric prostate MRI was also discussed however is not typically used for initial evaluation of an elevated PSA. Continued periodic surveillance was also discussed.  Will request any prior PSA results for comparison.  I have initially recommended a 30-day course of tamsulosin with a follow-up PSA in 1 month.  - Urinalysis, Complete    Abbie Sons, MD  [facility] [address] [place],  [address] [phone]

## 2017-10-30 ENCOUNTER — Other Ambulatory Visit: Payer: Self-pay

## 2017-10-30 MED ORDER — TAMSULOSIN HCL 0.4 MG PO CAPS: 0.4000 mg | ORAL_CAPSULE | Freq: Every day | ORAL | 0 refills | Status: DC

## 2017-11-06 ENCOUNTER — Other Ambulatory Visit: Payer: 59

## 2017-11-18 ENCOUNTER — Other Ambulatory Visit: Payer: 59

## 2017-11-18 DIAGNOSIS — R972 Elevated prostate specific antigen [PSA]: Secondary | ICD-10-CM

## 2017-11-19 LAB — PSA: Prostate Specific Ag, Serum: 5.4 ng/mL — ABNORMAL HIGH (ref 0.0–4.0)

## 2017-11-20 ENCOUNTER — Encounter (INDEPENDENT_AMBULATORY_CARE_PROVIDER_SITE_OTHER): Payer: Self-pay

## 2017-11-20 ENCOUNTER — Telehealth: Payer: Self-pay

## 2017-11-20 ENCOUNTER — Other Ambulatory Visit: Payer: Self-pay | Admitting: Urology

## 2017-11-20 DIAGNOSIS — R972 Elevated prostate specific antigen [PSA]: Secondary | ICD-10-CM

## 2017-11-20 NOTE — Telephone Encounter (Signed)
-----   Message from Abbie Sons, MD sent at 11/20/2017 12:58 PM EST ----- Repeat PSA remains elevated at 5.4.  He had a recent heart attack and coronary stent placement.  He cannot discontinue aspirin and Plavix.  Recommend scheduling a prostate MRI.

## 2017-11-20 NOTE — Telephone Encounter (Signed)
If he can come off Plavix and aspirin okay to schedule biopsy.

## 2017-11-20 NOTE — Telephone Encounter (Signed)
Made aware via mychart.

## 2017-11-20 NOTE — Telephone Encounter (Signed)
Pt wife, Chris [name], called with concerns of having prostate MRI. Chris [name] stated that pt has not had a recent MI, pt MI was 9-10 years ago. Chris [name] stated that her and pt would much rather have the prostate biopsy. Wife stated that she could get clearance from pt PCP to come off of plavix. Please advise.

## 2017-11-21 NOTE — Telephone Encounter (Signed)
-----   Message from Abbie Sons, MD sent at 11/20/2017 12:58 PM EST ----- Repeat PSA remains elevated at 5.4.  He had a recent heart attack and coronary stent placement.  He cannot discontinue aspirin and Plavix.  Recommend scheduling a prostate MRI.

## 2017-11-21 NOTE — Telephone Encounter (Signed)
Waiting on PA from Insurance and will get this scheduled as soon as I get this.  Chris [name]

## 2017-11-22 ENCOUNTER — Ambulatory Visit: Payer: 59 | Admitting: Urology

## 2017-11-22 ENCOUNTER — Encounter: Payer: Self-pay | Admitting: Urology

## 2017-11-22 VITALS — BP 123/74 | HR 73 | Ht 70.0 in | Wt 230.3 lb

## 2017-11-22 DIAGNOSIS — R972 Elevated prostate specific antigen [PSA]: Secondary | ICD-10-CM | POA: Diagnosis not present

## 2017-11-22 NOTE — Progress Notes (Signed)
11/22/2017 2:45 PM   Chris [name] 1961-08-14 [id number]  Referring provider: Casilda Carls, [place], [place] [address]  Chief Complaint  Patient presents with  . Elevated PSA    HPI: 57 year old male presents for follow-up of an elevated PSA.  He was initially seen on 10/03/2017 after a PSA drawn in November 2018 was elevated at 4.8. He elected a repeat PSA after a 30-day alpha-blocker course which remained elevated at 5.4.  I initially recommended a prostate MRI as I was under the impression that he could not discontinue Plavix for his coronary stent however his MI was 9 years ago.  He and his wife presented today to discuss options.   PMH: Past Medical History:  Diagnosis Date  . Coronary artery disease   . GERD (gastroesophageal reflux disease)   . Hyperlipidemia   . MI (myocardial infarction) ([place])   . Tobacco abuse     Surgical History: Past Surgical History:  Procedure Laterality Date  . CARDIAC CATHETERIZATION  2007   left   . WRIST SURGERY  2000    Home Medications:  Allergies as of 11/22/2017   No Known Allergies     Medication List        Accurate as of 11/22/17  2:45 PM. Always use your most recent med list.          aspirin 81 MG tablet Take 81 mg by mouth daily.   atorvastatin 10 MG tablet Commonly known as:  LIPITOR Take 10 mg by mouth daily.   clopidogrel 75 MG tablet Commonly known as:  PLAVIX TAKE 1 TABLET (75 MG TOTAL) BY MOUTH DAILY. <PLEASE MAKE APPOINTMENT FOR REFILLS>   metoprolol tartrate 25 MG tablet Commonly known as:  LOPRESSOR Take 1 tablet (25 mg total) by mouth daily. Need appointment before further refills   omeprazole 20 MG capsule Commonly known as:  PRILOSEC Take 20 mg by mouth daily.   oxyCODONE-acetaminophen 5-325 MG tablet Commonly known as:  PERCOCET/ROXICET Take 1 tablet by mouth every 6 (six) hours as needed.   tamsulosin 0.4 MG Caps capsule Commonly known as:  FLOMAX Take 1 capsule  (0.4 mg total) by mouth daily.       Allergies: No Known Allergies  Family History: Family History  Problem Relation Age of Onset  . Prostate cancer Neg Hx   . Kidney cancer Neg Hx     Social History:  reports that he has been smoking.  he has never used smokeless tobacco. He reports that he does not drink alcohol or use drugs.  ROS: UROLOGY Frequent Urination?: No Hard to postpone urination?: No Burning/pain with urination?: No Get up at night to urinate?: No Leakage of urine?: No Urine stream starts and stops?: No Trouble starting stream?: No Do you have to strain to urinate?: No Blood in urine?: No Urinary tract infection?: No Sexually transmitted disease?: No Injury to kidneys or bladder?: No Painful intercourse?: No Weak stream?: No Erection problems?: No Penile pain?: No  Gastrointestinal Nausea?: No Vomiting?: No Indigestion/heartburn?: Yes Diarrhea?: No Constipation?: No  Constitutional Fever: No Night sweats?: No Weight loss?: No Fatigue?: Yes  Skin Skin rash/lesions?: No Itching?: No  Eyes Blurred vision?: No Double vision?: No  Ears/Nose/Throat Sore throat?: No Sinus problems?: No  Hematologic/Lymphatic Swollen glands?: No Easy bruising?: No  Cardiovascular Leg swelling?: No Chest pain?: No  Respiratory Cough?: No Shortness of breath?: No  Endocrine Excessive thirst?: No  Musculoskeletal Back pain?: No Joint pain?: No  Neurological Headaches?: No Dizziness?: No  Psychologic Depression?: No Anxiety?: No  Physical Exam: BP 123/74 (BP Location: Right Arm, Patient Position: Sitting, Cuff Size: Large)   Pulse 73   Ht 5\' 10"  (1.778 m)   Wt 230 lb 4.8 oz (104.5 kg)   BMI 33.04 kg/m   Constitutional:  Alert and oriented, No acute distress. HEENT: [place] AT, moist mucus membranes.  Trachea midline, no masses. Cardiovascular: No clubbing, cyanosis, or edema. Respiratory: Normal respiratory effort, no increased work of  breathing. GU: No CVA tenderness.  Skin: No rashes, bruises or suspicious lesions. Lymph: No cervical or inguinal adenopathy. Neurologic: Grossly intact, no focal deficits, moving all 4 extremities. Psychiatric: Normal mood and affect.  Laboratory Data: Lab Results  Component Value Date   WBC 6.7 12/12/2015   HGB 18.0 (H) 12/12/2015   HCT 53.0 (H) 12/12/2015   MCV 89.1 12/12/2015   PLT 227 12/12/2015    Lab Results  Component Value Date   CREATININE 0.90 12/12/2015    Lab Results  Component Value Date   PSA1 5.4 (H) 11/18/2017    Assessment & Plan:   57 year old male with a persistently elevated PSA.  Since he is able to come off Plavix I did recommend a transrectal ultrasound and biopsy of the prostate.  We also discussed other options including MRI and additional blood testing such as Prostate Health Index and 4kScore.  He would like to have a 4K score drawn prior to scheduling prostate biopsy.  Greater than 50% of this 15-minute visit was spent counseling the patient.    Abbie Sons, [place] [address], [place], [place] [address] [phone]

## 2017-11-25 ENCOUNTER — Encounter: Payer: Self-pay | Admitting: Urology

## 2017-11-26 ENCOUNTER — Encounter (INDEPENDENT_AMBULATORY_CARE_PROVIDER_SITE_OTHER): Payer: Self-pay

## 2017-11-26 ENCOUNTER — Ambulatory Visit: Payer: 59

## 2017-11-26 DIAGNOSIS — R972 Elevated prostate specific antigen [PSA]: Secondary | ICD-10-CM

## 2017-11-26 NOTE — Progress Notes (Signed)
Pt presents today for 4K Score lab draw. 2 tiger tubes were drawn from pt left AC. Pt tolerated well. No s/s of adverse reaction noted.

## 2017-12-01 ENCOUNTER — Encounter (INDEPENDENT_AMBULATORY_CARE_PROVIDER_SITE_OTHER): Payer: Self-pay

## 2017-12-02 ENCOUNTER — Encounter (INDEPENDENT_AMBULATORY_CARE_PROVIDER_SITE_OTHER): Payer: Self-pay

## 2017-12-02 ENCOUNTER — Other Ambulatory Visit: Payer: Self-pay

## 2017-12-02 ENCOUNTER — Other Ambulatory Visit: Payer: Self-pay | Admitting: Urology

## 2017-12-02 MED ORDER — TAMSULOSIN HCL 0.4 MG PO CAPS: 0.4000 mg | ORAL_CAPSULE | Freq: Every day | ORAL | 3 refills | Status: DC

## 2017-12-03 ENCOUNTER — Encounter (INDEPENDENT_AMBULATORY_CARE_PROVIDER_SITE_OTHER): Payer: Self-pay

## 2017-12-03 ENCOUNTER — Telehealth: Payer: Self-pay

## 2017-12-03 NOTE — Telephone Encounter (Signed)
LMOM

## 2017-12-03 NOTE — Telephone Encounter (Signed)
-----   Message from Abbie Sons, MD sent at 12/03/2017  7:46 AM EST ----- 4Kscore showed an elevated risk of high-grade prostate cancer at 33%.  Would recommend scheduling prostate biopsy.  He is on aspirin and Plavix.

## 2017-12-03 NOTE — Telephone Encounter (Signed)
Spoke with pt wife in reference to 4K Score results. Made wife aware of needing to schedule prostate bx. Wife stated that PCP will not sign off on clearance until pt is seen. Reinforced with wife to have pt seen sooner rather than later so pt can get in [facility] for bx. Wife voiced understanding.

## 2018-03-12 ENCOUNTER — Encounter: Payer: Self-pay | Admitting: Radiation Oncology

## 2018-03-25 ENCOUNTER — Encounter: Payer: Self-pay | Admitting: Radiation Oncology

## 2018-03-25 DIAGNOSIS — C61 Malignant neoplasm of prostate: Secondary | ICD-10-CM | POA: Insufficient documentation

## 2018-03-25 NOTE — Progress Notes (Signed)
GU Location of Tumor / Histology: prostatic adenocarcinoma  If Prostate Cancer, Gleason Score is (3 + 4) and PSA is (5.4). Prostate volume: 40.3 mL  Chris [name] presented to Dr. Diona Fanti in April 2019 fur evaluation and management of his elevated PSA. Prior to visiting [place] patient was seen by Dr. Lenn Sink in [place]. Dr. Diona Fanti takes care of the patient's father in law.  Biopsies of prostate (if applicable) revealed:    Past/Anticipated interventions by urology, if any: biopsy, discussion surrounding tx options, referral to Dr. Tammi Klippel to discuss brachytherapy  Past/Anticipated interventions by medical oncology, if any: no  Weight changes, if any: no  Bowel/Bladder complaints, if any: frequency, nocturia, and ED. Denies dysuria, hematuria, urinary leakage or incontinence.   Nausea/Vomiting, if any: no  Pain issues, if any:  no  SAFETY ISSUES:  Prior radiation? no  Pacemaker/ICD? no  Possible current pregnancy? no  Is the patient on methotrexate? no  Current Complaints / other details:  57 year old male. 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 Cardiac stents placed in 2007. Taking Plavix. Seeds vs. Surgery. Patient's PCP is Dr. Gaspar Skeeters in [place], [place].

## 2018-03-26 ENCOUNTER — Ambulatory Visit
Admission: RE | Admit: 2018-03-26 | Discharge: 2018-03-26 | Disposition: A | Discharge status: Home / Self Care | Payer: 59 | Source: Ambulatory Visit | Attending: Radiation Oncology | Admitting: Radiation Oncology

## 2018-03-26 ENCOUNTER — Other Ambulatory Visit: Payer: Self-pay

## 2018-03-26 ENCOUNTER — Encounter: Payer: Self-pay | Admitting: Radiation Oncology

## 2018-03-26 DIAGNOSIS — F17210 Nicotine dependence, cigarettes, uncomplicated: Secondary | ICD-10-CM | POA: Diagnosis not present

## 2018-03-26 DIAGNOSIS — E785 Hyperlipidemia, unspecified: Secondary | ICD-10-CM | POA: Diagnosis not present

## 2018-03-26 DIAGNOSIS — I252 Old myocardial infarction: Secondary | ICD-10-CM | POA: Diagnosis not present

## 2018-03-26 DIAGNOSIS — K219 Gastro-esophageal reflux disease without esophagitis: Secondary | ICD-10-CM | POA: Insufficient documentation

## 2018-03-26 DIAGNOSIS — I2510 Atherosclerotic heart disease of native coronary artery without angina pectoris: Secondary | ICD-10-CM | POA: Insufficient documentation

## 2018-03-26 DIAGNOSIS — C61 Malignant neoplasm of prostate: Secondary | ICD-10-CM | POA: Insufficient documentation

## 2018-03-26 DIAGNOSIS — Z79899 Other long term (current) drug therapy: Secondary | ICD-10-CM | POA: Diagnosis not present

## 2018-03-26 DIAGNOSIS — Z7982 Long term (current) use of aspirin: Secondary | ICD-10-CM | POA: Insufficient documentation

## 2018-03-26 HISTORY — DX: Malignant neoplasm of prostate: C61

## 2018-03-26 NOTE — Progress Notes (Signed)
See progress note under physician encounter. 

## 2018-03-26 NOTE — Progress Notes (Signed)
Radiation Oncology         [phone] ________________________________  Initial Outpatient Consultation  Name: Chris [name] MRN: [id number]  Date: 03/26/2018  DOB: 1961-05-18  JO:ACZYSA, Chris Artist, MD  Franchot Gallo, MD   REFERRING PHYSICIAN: Franchot Gallo, MD  DIAGNOSIS: 57 y.o. male with Stage T1c adenocarcinoma of the prostate with Gleason Score of 3+4, and PSA of 5.1    ICD-10-CM   1. Malignant neoplasm of prostate ([place]) C61     HISTORY OF PRESENT ILLNESS: Chris [name] is a 57 y.o. male with a diagnosis of prostate cancer. He was initially noted to have an elevated PSA of 4.80 in November 2018 by his primary care physician, Dr. Rosario [name]. He was seen in urology by Dr. John [name] in January 2019, and repeat PSA was further elevated to 5.4. Dr. Bernardo [name] recommended ultrasound and biopsy of the prostate, however the patient was hesitant to undergo biopsy at that time. He did have a 4K test done that resulted in a score of 33 (high risk). Another PSA by Dr. Rosario [name] in February 2019 remained elevated at 5.1. Accordingly, he was referred for evaluation in urology to Dr. Diona [name] on 01/01/2018, for second opinion. A digital rectal examination was performed at that time revealing no prostate nodularity.  The patient proceeded to transrectal ultrasound with 12 biopsies of the prostate on 02/07/2018.  The prostate volume measured 40.27 cc.  Out of 12 core biopsies, 3 were positive.  The maximum Gleason score was 3+4, and this was seen in the left mid and left apex.  Additionally, there was Gleason 3+3 disease in the left apex lateral.    PSA History 11/2017: 5.10 10/2017: 5.4 08/2017: 4.80 01/2015: 3.10  The patient reviewed the biopsy results with his urologist and he has kindly been referred today for discussion of potential radiation treatment options. He is accompanied by his wife.  Of note, the patient has a history of CAD and MI status post cardiac stent placement in 2007  and is on Plavix. His cardiologist is Dr. Quay [name].   PREVIOUS RADIATION THERAPY: No  PAST MEDICAL HISTORY:  Past Medical History:  Diagnosis Date  . Coronary artery disease   . GERD (gastroesophageal reflux disease)   . Hyperlipidemia   . MI (myocardial infarction) ([place])   . Prostate cancer ([place])   . Tobacco abuse       PAST SURGICAL HISTORY: Past Surgical History:  Procedure Laterality Date  . CARDIAC CATHETERIZATION  2007   left   . PROSTATE BIOPSY    . WRIST SURGERY  2000    FAMILY HISTORY:  Family History  Problem Relation Age of Onset  . Prostate cancer Neg Hx   . Kidney cancer Neg Hx   . Cancer Neg Hx     SOCIAL HISTORY:  Social History   Socioeconomic History  . Marital status: Married    Spouse name: Not on file  . Number of children: Not on file  . Years of education: Not on file  . Highest education level: Not on file  Occupational History  . Not on file  Social Needs  . Financial resource strain: Not on file  . Food insecurity:    Worry: Not on file    Inability: Not on file  . Transportation needs:    Medical: Not on file    Non-medical: Not on file  Tobacco Use  . Smoking status: Current Some Day Smoker    Packs/day: 2.00    Years:  42.00    Pack years: 84.00  . Smokeless tobacco: Never Used  Substance and Sexual Activity  . Alcohol use: No    Frequency: Never  . Drug use: No  . Sexual activity: Not Currently  Lifestyle  . Physical activity:    Days per week: Not on file    Minutes per session: Not on file  . Stress: Not on file  Relationships  . Social connections:    Talks on phone: Not on file    Gets together: Not on file    Attends religious service: Not on file    Active member of club or organization: Not on file    Attends meetings of clubs or organizations: Not on file    Relationship status: Not on file  . Intimate partner violence:    Fear of current or ex partner: Not on file    Emotionally abused: Not on  file    Physically abused: Not on file    Forced sexual activity: Not on file  Other Topics Concern  . Not on file  Social History Narrative  . Not on file    ALLERGIES: Patient has no known allergies.  MEDICATIONS:  Current Outpatient Medications  Medication Sig Dispense Refill  . aspirin 81 MG tablet Take 81 mg by mouth daily.    Marland Kitchen atorvastatin (LIPITOR) 10 MG tablet Take 10 mg by mouth daily.  0  . clopidogrel (PLAVIX) 75 MG tablet TAKE 1 TABLET (75 MG TOTAL) BY MOUTH DAILY. <PLEASE MAKE APPOINTMENT FOR REFILLS> 5 tablet 0  . metoprolol tartrate (LOPRESSOR) 25 MG tablet Take 1 tablet (25 mg total) by mouth daily. Need appointment before further refills 30 tablet 0  . omeprazole (PRILOSEC) 20 MG capsule Take 20 mg by mouth daily.    . tamsulosin (FLOMAX) 0.4 MG CAPS capsule Take 1 capsule (0.4 mg total) by mouth daily. 30 capsule 3   No current facility-administered medications for this encounter.     REVIEW OF SYSTEMS:  On review of systems, the patient reports that he is doing well overall. He denies any chest pain, shortness of breath, cough, fevers, chills, night sweats, or unintended weight changes. He denies any bowel disturbances, and denies abdominal pain, nausea or vomiting. He denies any new musculoskeletal or joint aches or pains. His IPSS was 4, indicating mild urinary symptoms of urgency, weak stream, and nocturia x1. He denies any dysuria, hematuria, leakage or incontinence. He is unable to complete sexual activity with all attempts and has not been sexually active for the past 6 months. A complete review of systems is obtained and is otherwise negative.    PHYSICAL EXAM:  Wt Readings from Last 3 Encounters:  03/26/18 234 lb 6.4 oz (106.3 kg)  11/22/17 230 lb 4.8 oz (104.5 kg)  10/03/17 230 lb (104.3 kg)   Temp Readings from Last 3 Encounters:  03/26/18 98.1 F (36.7 C) (Oral)  12/12/15 97.5 F (36.4 C) (Oral)   BP Readings from Last 3 Encounters:  03/26/18  115/86  11/22/17 123/74  10/03/17 122/74   Pulse Readings from Last 3 Encounters:  03/26/18 67  11/22/17 73  10/03/17 71    /10  In general this is a well appearing caucasian male in no acute distress. He is alert and oriented x4 and appropriate throughout the examination. HEENT reveals that the patient is normocephalic, atraumatic. EOMs are intact. PERRLA. Skin is intact without any evidence of gross lesions. Cardiovascular exam reveals a regular rate and rhythm,  no clicks rubs or murmurs are auscultated. Chest is clear to auscultation bilaterally. Lymphatic assessment is performed and does not reveal any adenopathy in the cervical, supraclavicular, axillary, or inguinal chains. Abdomen has active bowel sounds in all quadrants and is intact. The abdomen is soft, non tender, non distended. Lower extremities are negative for pretibial pitting edema, deep calf tenderness, cyanosis or clubbing.   KPS = 100  100 - Normal; no complaints; no evidence of disease. 90   - Able to carry on normal activity; minor signs or symptoms of disease. 80   - Normal activity with effort; some signs or symptoms of disease. 20   - Cares for self; unable to carry on normal activity or to do active work. 60   - Requires occasional assistance, but is able to care for most of his personal needs. 50   - Requires considerable assistance and frequent medical care. 4   - Disabled; requires special care and assistance. 60   - Severely disabled; hospital admission is indicated although death not imminent. 16   - Very sick; hospital admission necessary; active supportive treatment necessary. 10   - Moribund; fatal processes progressing rapidly. 0     - Dead  Karnofsky DA, Abelmann [place], Craver LS and Burchenal [facility] [phone]) The use of the nitrogen mustards in the palliative treatment of carcinoma: with particular reference to bronchogenic carcinoma Cancer 1 634-56  LABORATORY DATA:  Lab Results  Component Value Date   WBC  6.7 12/12/2015   HGB 18.0 (H) 12/12/2015   HCT 53.0 (H) 12/12/2015   MCV 89.1 12/12/2015   PLT 227 12/12/2015   Lab Results  Component Value Date   NA 141 12/12/2015   K 3.9 12/12/2015   CL 104 12/12/2015   CO2 21 (L) 12/12/2015   Lab Results  Component Value Date   ALT 26 12/12/2015   AST 20 12/12/2015   ALKPHOS 67 12/12/2015   BILITOT 0.5 12/12/2015     RADIOGRAPHY: No results found.    IMPRESSION/PLAN: 1. 57 y.o. gentleman with Stage T1c adenocarcinoma of the prostate with Gleason Score of 3+4, and PSA of 5.1. We discussed the patient's workup and outlined the nature of prostate cancer in this setting. The patient's T stage, Gleason's score, and PSA put him into the favorable intermediate risk group. Accordingly, he is eligible for a variety of potential treatment options including prostatectomy, brachytherapy or external radiation. We discussed the available radiation techniques, and focused on the details of logistics and delivery. We discussed and outlined the risks, benefits, short and long-term effects associated with radiotherapy and compared and contrasted these with prostatectomy. We also discussed the role of SpaceOAR in reducing the rectal toxicity associated with radiotherapy.   At the conclusion of our conversation, the patient is undecided about which treatment option he would like to pursue but is leaning towards prostate brachytherapy with use of SpaceOAR to reduce rectal toxicity from radiotherapy.  The patient met briefly with Romie Jumper in our office who will be working closely with him to coordinate OR scheduling and pre and post procedure appointments should he elect to move forward with brachytherapy.  The patient would potentially like to schedule his procedure for August or September due to summer plans.  Given his history of CAD and MI, we would need to obtain cardiac clearance from the patient's cardiologist, Dr. Gwenlyn Found, prior to the seed implant  procedure.  We spent 70 minutes face to face with the patient and more than  50% of that time was spent in counseling and/or coordination of care.   Nicholos Johns, PA-C    Tyler Pita, MD  [facility] Direct Dial: [phone]  Fax: [phone] [email or web address]  Skype  [email or web address]    Page Me   This document serves as a record of services personally performed by Tyler Pita, MD and Freeman Caldron, PA-C. It was created on their behalf by Rae Lips, a trained medical scribe. The creation of this record is based on the scribe's personal observations and the providers' statements to them. This document has been checked and approved by the attending providers.

## 2018-03-27 ENCOUNTER — Other Ambulatory Visit: Payer: Self-pay | Admitting: Urology

## 2018-03-27 ENCOUNTER — Telehealth: Payer: Self-pay | Admitting: *Deleted

## 2018-03-27 DIAGNOSIS — C61 Malignant neoplasm of prostate: Secondary | ICD-10-CM

## 2018-03-27 NOTE — Telephone Encounter (Signed)
Called patient to inform of appt. With Dr. Gwenlyn Found on 04-15-18 @ 3 pm, they will do cardiac clearance that day, spoke with patient's wife - [name] and she is aware of this.

## 2018-04-01 ENCOUNTER — Telehealth: Payer: Self-pay | Admitting: Medical Oncology

## 2018-04-01 NOTE — Telephone Encounter (Signed)
Called patient and spoke with his wife to introduce myself as the prostate nurse navigator and my role. I was unable to meet them the day he consulted with Dr. Tammi Klippel. She states that he husband has to get cardiac clearance from Dr. Gwenlyn Found before he can receive treatment for his prostate cancer. She states that they are both a little over whelmed with the diagnosis and hearing about treatment options. She had questions about SpaceOAR, MRI and brachytherapy. I gave her my office phone number and asked her to call me or Ashlyn with question or concerns. She is aware that Melvia Heaps will be in contact them once we have cardiac clearance.

## 2018-04-08 ENCOUNTER — Encounter: Payer: Self-pay | Admitting: Cardiovascular Disease

## 2018-04-08 ENCOUNTER — Ambulatory Visit: Payer: 59 | Admitting: Cardiovascular Disease

## 2018-04-08 DIAGNOSIS — E7800 Pure hypercholesterolemia, unspecified: Secondary | ICD-10-CM

## 2018-04-08 DIAGNOSIS — I2510 Atherosclerotic heart disease of native coronary artery without angina pectoris: Secondary | ICD-10-CM | POA: Diagnosis not present

## 2018-04-08 DIAGNOSIS — Z720 Tobacco use: Secondary | ICD-10-CM | POA: Diagnosis not present

## 2018-04-08 MED ORDER — ATORVASTATIN CALCIUM 20 MG PO TABS: 20.0000 mg | ORAL_TABLET | Freq: Every day | ORAL | 3 refills | Status: DC

## 2018-04-08 NOTE — Assessment & Plan Note (Signed)
History of ongoing tobacco abuse of 1 or more packs a day recalcitrant to risk factor modification

## 2018-04-08 NOTE — Progress Notes (Signed)
04/08/2018 Chris [name]   09-27-1961  [id number]  Primary Physician Casilda Carls, MD Primary Cardiologist: Lorretta Harp MD FACP, [place], [place], [place]  HPI:  Chris [name] is a 57 y.o.  mildly overweight married Caucasian male, father of 1 child, whom I saw the office 10/27/2013.Marland Kitchen He has a history of CAD, status post acute inferior wall myocardial infarction with RV infarct physiology on April 17, 2006. I took him to the cath lab at midnight that day and opened up an occluded RCA with overlapping Cypher drug-eluting stents. Circumflex and LAD were free of significant disease. His EF was 40% to 45% with moderate inferior hypokinesia. Subsequent echocardiogram revealed normal LV function with borderline inferior hypokinesia, and a recent Myoview performed 1 year ago was not ischemic. His other problems include hyperlipidemia and continued tobacco abuse of 1 pack per day. Denies chest pain or shortness of breath but does complain of fatigue. Since I saw him in the office 4 years ago he has developed prostate cancer, stage I, needs radioactive seed implant and radiation therapy.  Current Meds  Medication Sig  . atorvastatin (LIPITOR) 10 MG tablet Take 10 mg by mouth daily.  . clopidogrel (PLAVIX) 75 MG tablet TAKE 1 TABLET (75 MG TOTAL) BY MOUTH DAILY. <PLEASE MAKE APPOINTMENT FOR REFILLS>  . ergocalciferol (VITAMIN D2) 50000 units capsule Take 50,000 Units by mouth once a week.  . metoprolol tartrate (LOPRESSOR) 25 MG tablet Take 1 tablet (25 mg total) by mouth daily. Need appointment before further refills  . pantoprazole (PROTONIX) 40 MG tablet Take 40 mg by mouth daily.  . tamsulosin (FLOMAX) 0.4 MG CAPS capsule Take 1 capsule (0.4 mg total) by mouth daily.     No Known Allergies  Social History   Socioeconomic History  . Marital status: Married    Spouse name: Not on file  . Number of children: Not on file  . Years of education: Not on file  . Highest education level: Not on  file  Occupational History  . Not on file  Social Needs  . Financial resource strain: Not on file  . Food insecurity:    Worry: Not on file    Inability: Not on file  . Transportation needs:    Medical: Not on file    Non-medical: Not on file  Tobacco Use  . Smoking status: Current Some Day Smoker    Packs/day: 2.00    Years: 42.00    Pack years: 84.00  . Smokeless tobacco: Never Used  Substance and Sexual Activity  . Alcohol use: No    Frequency: Never  . Drug use: No  . Sexual activity: Not Currently  Lifestyle  . Physical activity:    Days per week: Not on file    Minutes per session: Not on file  . Stress: Not on file  Relationships  . Social connections:    Talks on phone: Not on file    Gets together: Not on file    Attends religious service: Not on file    Active member of club or organization: Not on file    Attends meetings of clubs or organizations: Not on file    Relationship status: Not on file  . Intimate partner violence:    Fear of current or ex partner: Not on file    Emotionally abused: Not on file    Physically abused: Not on file    Forced sexual activity: Not on file  Other Topics Concern  .  Not on file  Social History Narrative  . Not on file     Review of Systems: General: negative for chills, fever, night sweats or weight changes.  Cardiovascular: negative for chest pain, dyspnea on exertion, edema, orthopnea, palpitations, paroxysmal nocturnal dyspnea or shortness of breath Dermatological: negative for rash Respiratory: negative for cough or wheezing Urologic: negative for hematuria Abdominal: negative for nausea, vomiting, diarrhea, bright red blood per rectum, melena, or hematemesis Neurologic: negative for visual changes, syncope, or dizziness All other systems reviewed and are otherwise negative except as noted above.    Blood pressure 130/70, pulse 70, height 5\' 10"  (1.778 m), weight 232 lb (105.2 kg).  General appearance: alert  and no distress Neck: no adenopathy, no carotid bruit, no JVD, supple, symmetrical, trachea midline and thyroid not enlarged, symmetric, no tenderness/mass/nodules Lungs: clear to auscultation bilaterally Heart: regular rate and rhythm, S1, S2 normal, no murmur, click, rub or gallop Extremities: extremities normal, atraumatic, no cyanosis or edema Pulses: 2+ and symmetric Skin: Skin color, texture, turgor normal. No rashes or lesions Neurologic: Alert and oriented X 3, normal strength and tone. Normal symmetric reflexes. Normal coordination and gait  EKG sinus rhythm at 70 with an old inferoposterior wall myocardial infarction.  Personally reviewed this EKG  ASSESSMENT AND PLAN:   Tobacco abuse History of ongoing tobacco abuse of 1 or more packs a day recalcitrant to risk factor modification  Hyperlipidemia History of hyperlipidemia on 10 mg of Lipitor with LDL of 87-12/18.  He is not at goal for secondary prevention.  20 mg a day and recheck a lipid liver profile  Coronary artery disease History of CAD status post infarction with RV infarct physiology 04/17/2006.  I took him to the  Cath Lab at midnight that day and occluded with up an occluded dominant RCA with overlapping Cypher drug-eluting stents.  Circumflex and LAD were free of significant disease.  His EF was 40 to 45% with moderate hypokinesia was EF subsequently normalized.  He did Myoview stress test performed in 2013 that was nonischemic.  He denies chest pain or shortness of breath.      Lorretta Harp MD FACP,FACC,FAHA, [facility] 04/08/2018 4:28 PM

## 2018-04-08 NOTE — Patient Instructions (Signed)
Medication Instructions:  Start Lipitor 20mg  take 1 tablet once a day   Labwork: Your physician recommends that you return for lab work in: 3 months after starting Lipitor   Testing/Procedures: None   Follow-Up: Your physician wants you to follow-up in: 12 months with Dr Gwenlyn Found. You will receive a reminder letter in the mail two months in advance. If you don't receive a letter, please call our office to schedule the follow-up appointment.  Any Other Special Instructions Will Be Listed Below (If Applicable).  If you need a refill on your cardiac medications before your next appointment, please call your pharmacy.

## 2018-04-08 NOTE — Assessment & Plan Note (Signed)
History of hyperlipidemia on 10 mg of Lipitor with LDL of 87-12/18.  He is not at goal for secondary prevention.  20 mg a day and recheck a lipid liver profile

## 2018-04-08 NOTE — Assessment & Plan Note (Signed)
History of CAD status post infarction with RV infarct physiology 04/17/2006.  I took him to the  Cath Lab at midnight that day and occluded with up an occluded dominant RCA with overlapping Cypher drug-eluting stents.  Circumflex and LAD were free of significant disease.  His EF was 40 to 45% with moderate hypokinesia was EF subsequently normalized.  He did Myoview stress test performed in 2013 that was nonischemic.  He denies chest pain or shortness of breath.

## 2018-04-08 NOTE — Addendum Note (Signed)
Addended by: Ulice Brilliant T on: 04/08/2018 04:31 PM   Modules accepted: Orders

## 2018-04-11 ENCOUNTER — Encounter: Payer: Self-pay | Admitting: Urology

## 2018-04-11 NOTE — Progress Notes (Signed)
I spoke with the patient's wife who confirmed that he has been granted cardiac clearance from Dr. Gwenlyn Found and they would like to proceed with scheduling brachytherapy with Dr. Diona Fanti in [place]

## 2018-04-15 ENCOUNTER — Ambulatory Visit: Payer: 59 | Admitting: Cardiovascular Disease

## 2018-04-17 ENCOUNTER — Telehealth: Payer: Self-pay | Admitting: *Deleted

## 2018-04-17 NOTE — Telephone Encounter (Signed)
Called patient to inform of pre-seed planning CT and implant date, lvm for a return call

## 2018-04-18 ENCOUNTER — Telehealth: Payer: Self-pay | Admitting: *Deleted

## 2018-04-18 NOTE — Telephone Encounter (Signed)
CALLED PATIENT TO INFORM OF PRE-SEED PLANNING CT, CHEST AND EKG AND IMPLANT, SPOKE WITH PATIENT'S WIFE- ELIZABETH AND SHE IS AWARE OF THESE APPTS.

## 2018-04-23 ENCOUNTER — Telehealth: Payer: Self-pay | Admitting: *Deleted

## 2018-04-23 NOTE — Telephone Encounter (Signed)
   [facility] Pre-operative Risk Assessment    Request for surgical clearance:  1. What type of surgery is being performed? RADIOACTIVE PROSTATE SEED IMPLANT AND SPACE OAR INSERT   2. When is this surgery scheduled? SEPT 19.2019  3. What type of clearance is required (medical clearance vs. Pharmacy clearance to hold med vs. Both)? BOTH  4. Are there any medications that need to be held prior to surgery and how long? PLAVIX  FOR 1 WEEK   5. Practice name and name of physician performing surgery? [facility] 6. What is your office phone number [phone]   7.   What is your office fax number  [phone]  8.   Anesthesia type (None, local, MAC, general) ? CHOICE   Devra Dopp 04/23/2018, 4:30 PM  _________________________________________________________________   (provider comments below)

## 2018-04-24 NOTE — Telephone Encounter (Signed)
   Primary Cardiologist: Quay Burow, MD  Chart reviewed as part of pre-operative protocol coverage.  Chris [name] was last seen on 04/08/18 by Dr. Gwenlyn Found. H/o CAD with inf MI/RV infarct 2007 s/p DES to RCA, EF 40-45%. Nuc 2012 showed no ischemia, EF 56%. Other PMH HLD, tobacco, GERD, prostate CA. His note does reference the known need for radioactive seed implant and radiation therapy - clearance not prohibited but not commented on either.   Given recently seen, will forward to Dr Gwenlyn Found for input on preop clearance and holding Plavix 1 week as requested. Patient is not on aspirin. Dr. Gwenlyn Found - Please route response to P CV DIV PREOP (the pre-op pool). Thank you.   [name], PA-C 04/24/2018, 2:40 PM

## 2018-04-24 NOTE — Telephone Encounter (Signed)
I have no problem with him holding antiplatelet drugs for his urologic procedure.

## 2018-04-24 NOTE — Telephone Encounter (Addendum)
   Primary Cardiologist: Quay Burow, MD  Preop-update: Dr. Gwenlyn Found states, "I have no problem with him holding antiplatelet drugs for his urologic procedure."  Called patient to make him aware and also discuss importance of notifying of any new symptoms between now and scheduled date. Got VM, LMOM to call back. Called the additional number which was a work number but was informed he is not employed there.  [name], PA-C 04/24/2018, 4:21 PM   Addendum: wife called back and gave me patient's cell phone number - spoke with patient who affirms he's doing great, no CP or SOB. Very active walking and riding bike without angina. As above, given past medical history and time since last visit, based on ACC/AHA guidelines, Chris [name] would be at acceptable risk for the planned procedure without further cardiovascular testing. He was advised to keep [place] apprised of any new symptoms before surgery and told that he may hold Plavix 1 week prior to procedure - would defer to surgeon's office to call him with final instructions closer to time of surgery.  Dayna Dunn PA-C 5:00 PM

## 2018-05-05 ENCOUNTER — Telehealth: Payer: Self-pay | Admitting: Medical Oncology

## 2018-05-05 NOTE — Telephone Encounter (Signed)
Elizabeth-wife called with questions regarding brachytherapy procedure. I discussed with her and informed her that I have patient mentors  who have had the procedure that are willing to call her husband and share their experience. She will discuss with him and call me.

## 2018-05-16 ENCOUNTER — Other Ambulatory Visit (HOSPITAL_COMMUNITY): Payer: 59

## 2018-05-16 ENCOUNTER — Ambulatory Visit: Payer: 59 | Admitting: Radiation Oncology

## 2018-05-16 ENCOUNTER — Ambulatory Visit: Payer: Self-pay | Admitting: Radiation Oncology

## 2018-05-29 ENCOUNTER — Telehealth: Payer: Self-pay | Admitting: *Deleted

## 2018-05-29 NOTE — Telephone Encounter (Signed)
CALLED PATIENT TO REMIND OF APPTS. FOR 05-30-18, LVM FOR A RETURN CALL

## 2018-05-30 ENCOUNTER — Ambulatory Visit: Payer: 59 | Admitting: Radiation Oncology

## 2018-05-30 ENCOUNTER — Encounter (HOSPITAL_COMMUNITY)
Admission: RE | Admit: 2018-05-30 | Discharge: 2018-05-30 | Disposition: A | Discharge status: Home / Self Care | Payer: 59 | Source: Ambulatory Visit | Attending: Urology | Admitting: Urology

## 2018-05-30 ENCOUNTER — Ambulatory Visit
Admission: RE | Admit: 2018-05-30 | Discharge: 2018-05-30 | Disposition: A | Discharge status: Home / Self Care | Payer: 59 | Source: Ambulatory Visit | Attending: Radiation Oncology | Admitting: Radiation Oncology

## 2018-05-30 ENCOUNTER — Ambulatory Visit (HOSPITAL_COMMUNITY)
Admission: RE | Admit: 2018-05-30 | Discharge: 2018-05-30 | Disposition: A | Discharge status: Home / Self Care | Payer: 59 | Source: Ambulatory Visit | Attending: Urology | Admitting: Urology

## 2018-05-30 ENCOUNTER — Encounter: Payer: Self-pay | Admitting: Medical Oncology

## 2018-05-30 DIAGNOSIS — C61 Malignant neoplasm of prostate: Secondary | ICD-10-CM | POA: Diagnosis present

## 2018-05-30 DIAGNOSIS — Z01818 Encounter for other preprocedural examination: Secondary | ICD-10-CM | POA: Insufficient documentation

## 2018-05-30 IMAGING — DX DG CHEST 2V
2 series · 2 of 2 positions shown · non-contrast
Comparison: None.

CLINICAL DATA: Preop testing.  Prostate cancer.

EXAM:
CHEST - 2 VIEW

[chest pa]
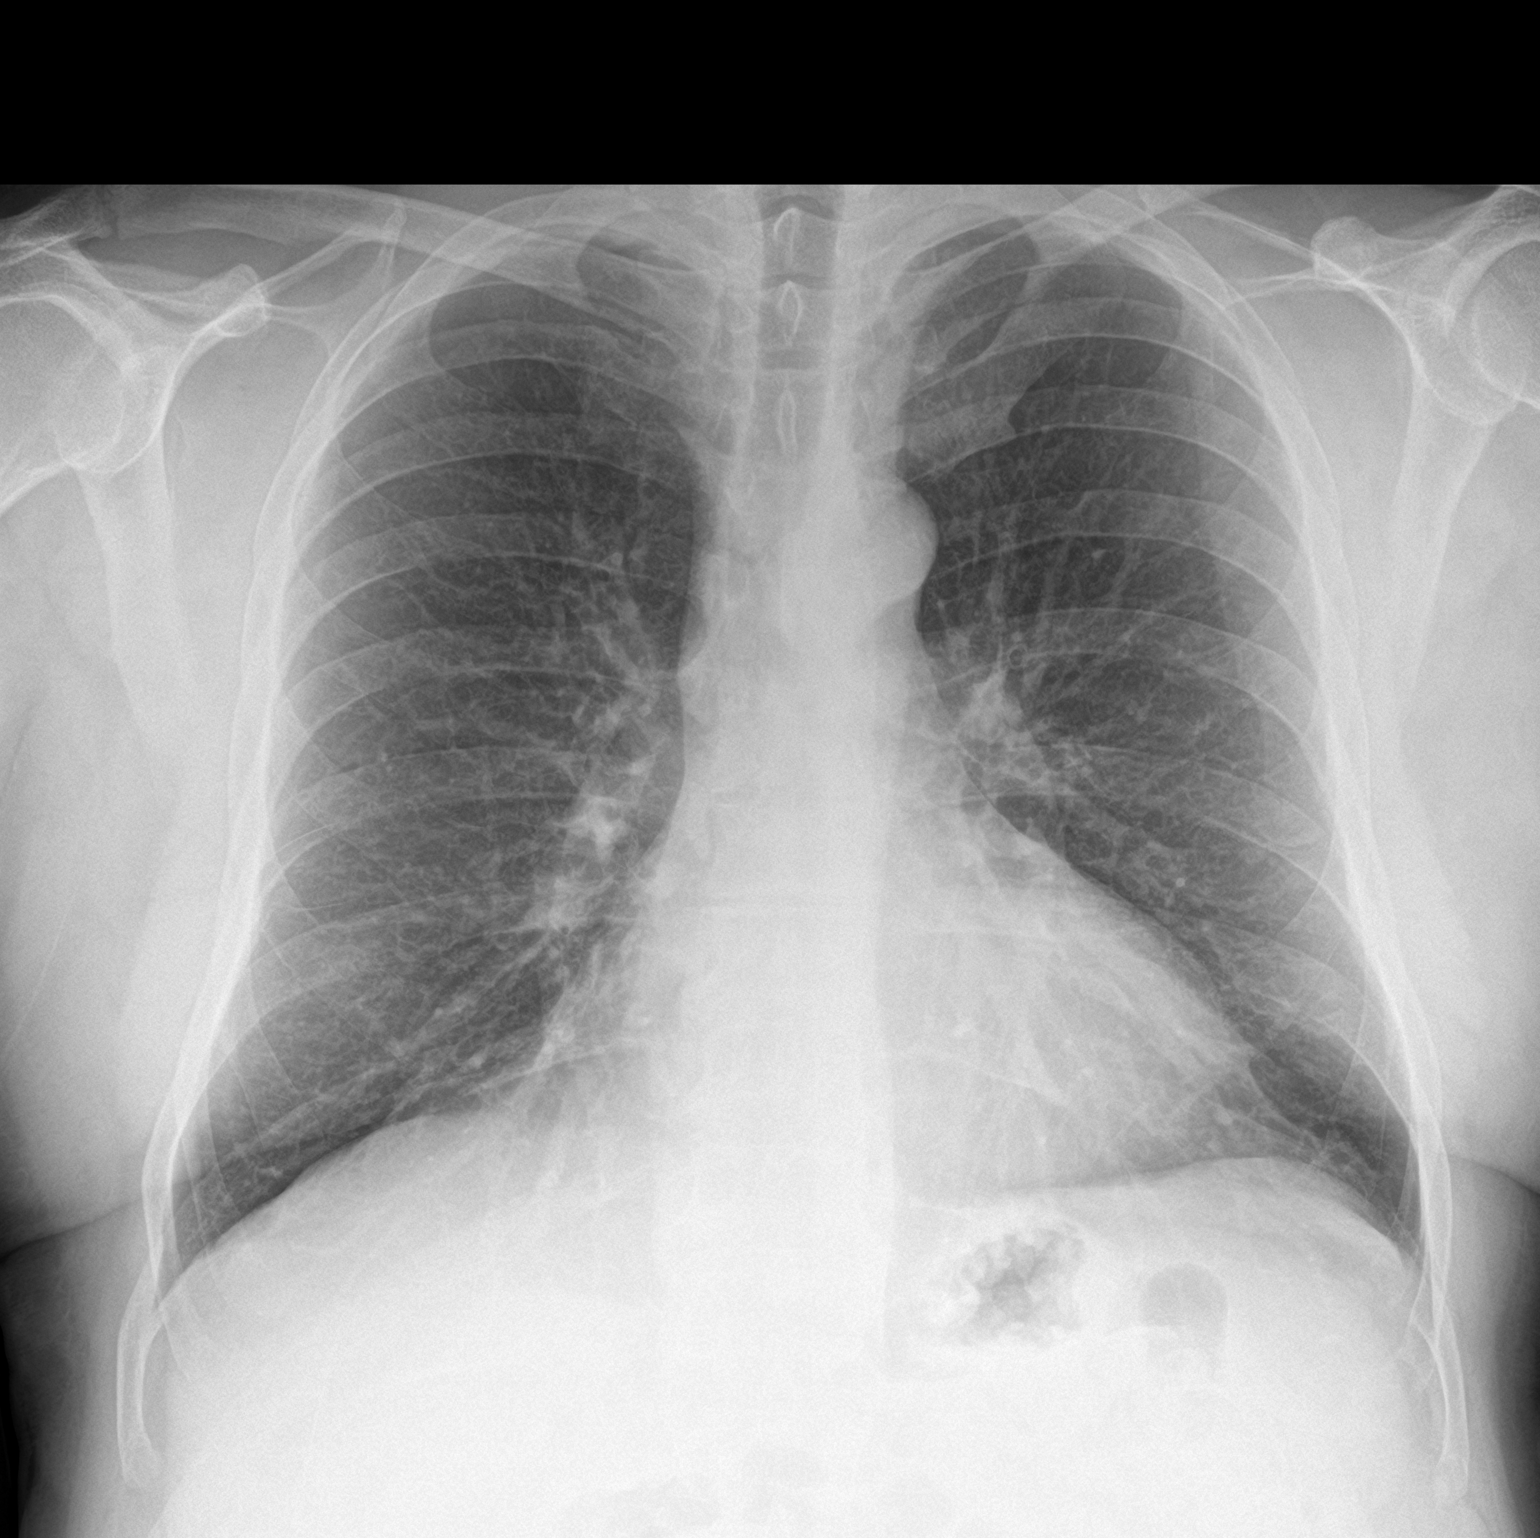

[chest lat]
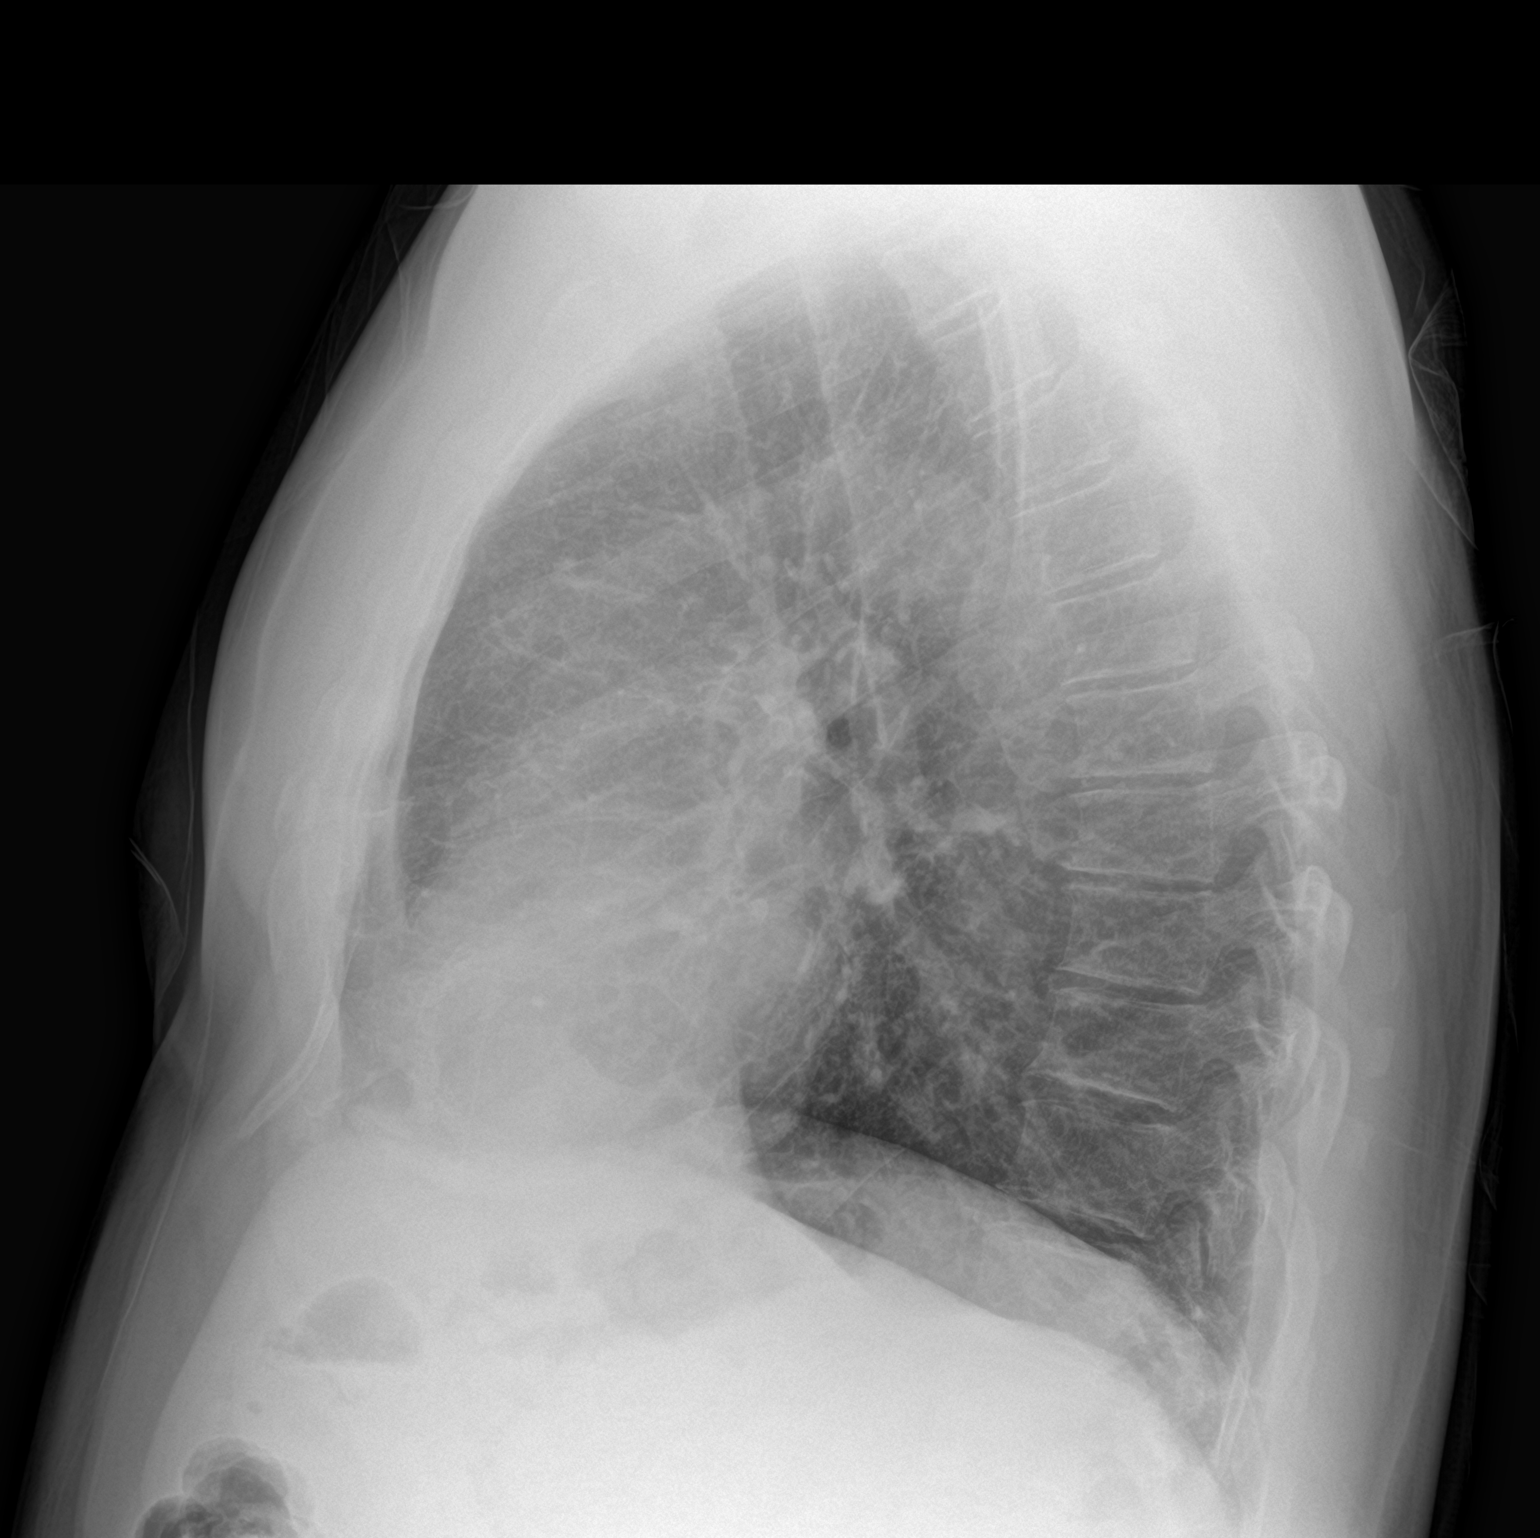

[2 of 2 positions shown; findings below may reference images not displayed]

FINDINGS: Heart size and vascularity normal. Lungs are clear without
infiltrate effusion or mass. No acute skeletal abnormality.
IMPRESSION: No active cardiopulmonary disease.

## 2018-05-30 NOTE — Progress Notes (Signed)
  Radiation Oncology         [phone]) [phone] ________________________________  Name: Chris [name] MRN: [id number]  Date: 05/30/2018  DOB: 12/30/1960  SIMULATION AND TREATMENT PLANNING NOTE PUBIC ARCH STUDY  EE:FEOFHQ, [name], MD (Inactive)  Franchot Gallo, MD  DIAGNOSIS:  57 y.o. male with Stage T1c adenocarcinoma of the prostate with Gleason Score of 3+4, and PSA of 5.1     ICD-10-CM   1. Malignant neoplasm of prostate ([place]) C61     COMPLEX SIMULATION:  The patient presented today for evaluation for possible prostate seed implant. He was brought to the radiation planning suite and placed supine on the CT couch. A 3-dimensional image study set was obtained in upload to the planning computer. There, on each axial slice, I contoured the prostate gland. Then, using three-dimensional radiation planning tools I reconstructed the prostate in view of the structures from the transperineal needle pathway to assess for possible pubic arch interference. In doing so, I did not appreciate any pubic arch interference. Also, the patient's prostate volume was estimated based on the drawn structure. The volume was 47 cc and previous TRUS vol was 40.27.  Given the pubic arch appearance and prostate volume, patient remains a good candidate to proceed with prostate seed implant. Today, he freely provided informed written consent to proceed.    PLAN: The patient will undergo prostate seed implant.   ________________________________  Sheral Apley. Tammi Klippel, M.D.

## 2018-05-30 NOTE — Progress Notes (Signed)
Pt has current ekg dated 06/ 2019 in epic.

## 2018-06-25 ENCOUNTER — Other Ambulatory Visit: Payer: Self-pay | Admitting: Urology

## 2018-06-25 DIAGNOSIS — C61 Malignant neoplasm of prostate: Secondary | ICD-10-CM

## 2018-07-02 ENCOUNTER — Telehealth: Payer: Self-pay | Admitting: *Deleted

## 2018-07-02 NOTE — Telephone Encounter (Signed)
CALLED PATIENT TO INFORM OF LAB APPT. FOR 07-10-18, LVM FOR A RETURN  CALL

## 2018-07-04 ENCOUNTER — Telehealth: Payer: Self-pay | Admitting: *Deleted

## 2018-07-04 NOTE — Telephone Encounter (Signed)
calld patient to remind of lab for 07-10-18 - arrival time - 1:45 pm @ [facility] Admitting and his procedure on 07-17-18, spoke with patient's wife and she is aware of these appts.

## 2018-07-08 ENCOUNTER — Other Ambulatory Visit: Payer: Self-pay | Admitting: Urology

## 2018-07-08 ENCOUNTER — Encounter (HOSPITAL_BASED_OUTPATIENT_CLINIC_OR_DEPARTMENT_OTHER): Payer: Self-pay

## 2018-07-09 ENCOUNTER — Encounter (HOSPITAL_BASED_OUTPATIENT_CLINIC_OR_DEPARTMENT_OTHER): Payer: Self-pay

## 2018-07-09 ENCOUNTER — Other Ambulatory Visit: Payer: Self-pay

## 2018-07-09 NOTE — Progress Notes (Signed)
Spoke with:  [name] (wife) NPO:  After Midnight, no gum, candy, or mints   Arrival time:  0530AM Labs:  (EKG 04/08/2018, CXR 05/30/2018, CBC,CMP,PT, PTT 07/10/2018 IN Epic) AM medications:Atorvastatin, Metoprolol, Pantoprazole, Tamsulosin,  Pre op orders: needs second sign Ride home:  [name] (wife) [phone] Instructed to use fleet enema the morning of surgery

## 2018-07-10 ENCOUNTER — Encounter (HOSPITAL_COMMUNITY)
Admission: RE | Admit: 2018-07-10 | Discharge: 2018-07-10 | Disposition: A | Discharge status: Home / Self Care | Payer: 59 | Source: Ambulatory Visit | Attending: Urology | Admitting: Urology

## 2018-07-10 DIAGNOSIS — Z01812 Encounter for preprocedural laboratory examination: Secondary | ICD-10-CM | POA: Insufficient documentation

## 2018-07-10 LAB — COMPREHENSIVE METABOLIC PANEL
ALT: 22 U/L (ref 0–44)
AST: 22 U/L (ref 15–41)
Albumin: 4 g/dL (ref 3.5–5.0)
Alkaline Phosphatase: 61 U/L (ref 38–126)
Anion gap: 8 (ref 5–15)
BUN: 17 mg/dL (ref 6–20)
CO2: 23 mmol/L (ref 22–32)
Calcium: 9 mg/dL (ref 8.9–10.3)
Chloride: 107 mmol/L (ref 98–111)
Creatinine, Ser: 1.03 mg/dL (ref 0.61–1.24)
GFR calc Af Amer: >60 mL/min (ref >60)
GFR calc non Af Amer: >60 mL/min (ref >60)
Glucose, Bld: 116 mg/dL — ABNORMAL HIGH (ref 70–99)
Potassium: 4 mmol/L (ref 3.5–5.1)
Sodium: 138 mmol/L (ref 135–145)
Total Bilirubin: 0.7 mg/dL (ref 0.3–1.2)
Total Protein: 7 g/dL (ref 6.5–8.1)

## 2018-07-10 LAB — CBC
HCT: 47.9 % (ref 39.0–52.0)
Hemoglobin: 16.4 g/dL (ref 13.0–17.0)
MCH: 30.5 pg (ref 26.0–34.0)
MCHC: 34.2 g/dL (ref 30.0–36.0)
MCV: 89 fL (ref 78.0–100.0)
Platelets: 233 10*3/uL (ref 150–400)
RBC: 5.38 MIL/uL (ref 4.22–5.81)
RDW: 14.1 % (ref 11.5–15.5)
WBC: 6.7 10*3/uL (ref 4.0–10.5)

## 2018-07-10 LAB — PROTIME-INR
INR: 0.97
Prothrombin Time: 12.8 seconds (ref 11.4–15.2)

## 2018-07-10 LAB — APTT: aPTT: 32 seconds (ref 24–36)

## 2018-07-16 NOTE — Anesthesia Preprocedure Evaluation (Addendum)
Anesthesia Evaluation  Patient identified by MRN, date of birth, ID band Patient awake    Reviewed: Allergy & Precautions, NPO status , Patient's Chart, lab work & pertinent test results, reviewed documented beta blocker date and time   History of Anesthesia Complications (+) history of anesthetic complications  Airway Mallampati: II  TM Distance: >3 FB Neck ROM: Full    Dental no notable dental hx. (+) Lower Dentures, Upper Dentures   Pulmonary Current Smoker,    Pulmonary exam normal breath sounds clear to auscultation       Cardiovascular + CAD, + Past MI and + Cardiac Stents  Normal cardiovascular exam Rhythm:Regular Rate:Normal  Inf wall MI with cardiogenic shock 2007 PTCA with stents x 2, asymptomatic from cardiac standpoint since stents   Neuro/Psych negative neurological ROS  negative psych ROS   GI/Hepatic Neg liver ROS, GERD  Medicated and Controlled,  Endo/Other  Hyperlipidemia  Renal/GU negative Renal ROS   Prostate Ca    Musculoskeletal negative musculoskeletal ROS (+)   Abdominal (+) + obese,   Peds  Hematology Plavix- last dose 7 days ago   Anesthesia Other Findings   Reproductive/Obstetrics                          Anesthesia Physical Anesthesia Plan  ASA: III  Anesthesia Plan: General   Post-op Pain Management:    Induction: Intravenous  PONV Risk Score and Plan: Ondansetron, Dexamethasone and Treatment may vary due to age or medical condition  Airway Management Planned: LMA  Additional Equipment:   Intra-op Plan:   Post-operative Plan: Extubation in OR  Informed Consent: I have reviewed the patients History and Physical, chart, labs and discussed the procedure including the risks, benefits and alternatives for the proposed anesthesia with the patient or authorized representative who has indicated his/her understanding and acceptance.   Dental advisory  given  Plan Discussed with: CRNA and Surgeon  Anesthesia Plan Comments:        Anesthesia Quick Evaluation

## 2018-07-17 ENCOUNTER — Encounter (HOSPITAL_BASED_OUTPATIENT_CLINIC_OR_DEPARTMENT_OTHER): Payer: Self-pay | Admitting: *Deleted

## 2018-07-17 ENCOUNTER — Other Ambulatory Visit: Payer: Self-pay

## 2018-07-17 ENCOUNTER — Ambulatory Visit (HOSPITAL_BASED_OUTPATIENT_CLINIC_OR_DEPARTMENT_OTHER): Payer: 59 | Admitting: Anesthesiology

## 2018-07-17 ENCOUNTER — Ambulatory Visit (HOSPITAL_COMMUNITY): Payer: 59

## 2018-07-17 ENCOUNTER — Ambulatory Visit (HOSPITAL_BASED_OUTPATIENT_CLINIC_OR_DEPARTMENT_OTHER)
Admission: RE | Admit: 2018-07-17 | Discharge: 2018-07-17 | Disposition: A | Discharge status: Home / Self Care | Payer: 59 | Source: Other Acute Inpatient Hospital | Attending: Urology | Admitting: Urology

## 2018-07-17 ENCOUNTER — Encounter (HOSPITAL_BASED_OUTPATIENT_CLINIC_OR_DEPARTMENT_OTHER)
Admission: RE | Disposition: A | Discharge status: Home / Self Care | Payer: Self-pay | Source: Other Acute Inpatient Hospital | Attending: Urology

## 2018-07-17 DIAGNOSIS — Z7902 Long term (current) use of antithrombotics/antiplatelets: Secondary | ICD-10-CM | POA: Insufficient documentation

## 2018-07-17 DIAGNOSIS — Z6832 Body mass index (BMI) 32.0-32.9, adult: Secondary | ICD-10-CM | POA: Insufficient documentation

## 2018-07-17 DIAGNOSIS — E669 Obesity, unspecified: Secondary | ICD-10-CM | POA: Diagnosis not present

## 2018-07-17 DIAGNOSIS — E785 Hyperlipidemia, unspecified: Secondary | ICD-10-CM | POA: Diagnosis not present

## 2018-07-17 DIAGNOSIS — I2510 Atherosclerotic heart disease of native coronary artery without angina pectoris: Secondary | ICD-10-CM | POA: Insufficient documentation

## 2018-07-17 DIAGNOSIS — Z79899 Other long term (current) drug therapy: Secondary | ICD-10-CM | POA: Diagnosis not present

## 2018-07-17 DIAGNOSIS — K219 Gastro-esophageal reflux disease without esophagitis: Secondary | ICD-10-CM | POA: Insufficient documentation

## 2018-07-17 DIAGNOSIS — C61 Malignant neoplasm of prostate: Secondary | ICD-10-CM | POA: Diagnosis present

## 2018-07-17 DIAGNOSIS — Z955 Presence of coronary angioplasty implant and graft: Secondary | ICD-10-CM | POA: Insufficient documentation

## 2018-07-17 DIAGNOSIS — I252 Old myocardial infarction: Secondary | ICD-10-CM | POA: Insufficient documentation

## 2018-07-17 DIAGNOSIS — Z01818 Encounter for other preprocedural examination: Secondary | ICD-10-CM

## 2018-07-17 DIAGNOSIS — F17210 Nicotine dependence, cigarettes, uncomplicated: Secondary | ICD-10-CM | POA: Diagnosis not present

## 2018-07-17 DIAGNOSIS — I442 Atrioventricular block, complete: Secondary | ICD-10-CM | POA: Diagnosis not present

## 2018-07-17 HISTORY — DX: Adverse effect of unspecified anesthetic, initial encounter: T41.45XA

## 2018-07-17 HISTORY — PX: RADIOACTIVE SEED IMPLANT: SHX5150

## 2018-07-17 HISTORY — DX: Other complications of anesthesia, initial encounter: T88.59XA

## 2018-07-17 HISTORY — PX: SPACE OAR INSTILLATION: SHX6769

## 2018-07-17 HISTORY — PX: CYSTOSCOPY: SHX5120

## 2018-07-17 HISTORY — DX: Presence of dental prosthetic device (complete) (partial): Z97.2

## 2018-07-17 HISTORY — DX: Presence of spectacles and contact lenses: Z97.3

## 2018-07-17 SURGERY — INSERTION, RADIATION SOURCE, PROSTATE
Anesthesia: General | Site: Rectum

## 2018-07-17 MED ORDER — LACTATED RINGERS IV SOLN
INTRAVENOUS | Status: DC
  Administered 2018-07-17 (×2): via INTRAVENOUS
  Filled 2018-07-17: qty 1000

## 2018-07-17 MED ORDER — SODIUM CHLORIDE 0.9 % IV SOLN
INTRAVENOUS | Status: DC | PRN
  Administered 2018-07-17: 100 ug/min via INTRAVENOUS

## 2018-07-17 MED ORDER — SUGAMMADEX SODIUM 200 MG/2ML IV SOLN
INTRAVENOUS | Status: DC | PRN
  Administered 2018-07-17: 200 mg via INTRAVENOUS

## 2018-07-17 MED ORDER — ROCURONIUM BROMIDE 10 MG/ML (PF) SYRINGE
PREFILLED_SYRINGE | INTRAVENOUS | Status: DC | PRN
  Administered 2018-07-17: 10 mg via INTRAVENOUS
  Administered 2018-07-17: 40 mg via INTRAVENOUS

## 2018-07-17 MED ORDER — ARTIFICIAL TEARS OPHTHALMIC OINT
TOPICAL_OINTMENT | OPHTHALMIC | Status: AC
  Filled 2018-07-17: qty 3.5

## 2018-07-17 MED ORDER — PROPOFOL 10 MG/ML IV BOLUS
INTRAVENOUS | Status: DC | PRN
  Administered 2018-07-17: 200 mg via INTRAVENOUS

## 2018-07-17 MED ORDER — CEFAZOLIN SODIUM-DEXTROSE 2-4 GM/100ML-% IV SOLN
2.0000 g | Freq: Once | INTRAVENOUS | Status: AC
  Administered 2018-07-17: 2 g via INTRAVENOUS
  Filled 2018-07-17: qty 100

## 2018-07-17 MED ORDER — HYDROCODONE-ACETAMINOPHEN 7.5-325 MG PO TABS
ORAL_TABLET | ORAL | Status: AC
  Filled 2018-07-17: qty 1

## 2018-07-17 MED ORDER — FENTANYL CITRATE (PF) 100 MCG/2ML IJ SOLN
INTRAMUSCULAR | Status: DC | PRN
  Administered 2018-07-17 (×2): 25 ug via INTRAVENOUS
  Administered 2018-07-17 (×2): 50 ug via INTRAVENOUS

## 2018-07-17 MED ORDER — HYDROCODONE-ACETAMINOPHEN 7.5-325 MG PO TABS
1.0000 | ORAL_TABLET | Freq: Once | ORAL | Status: AC | PRN
  Administered 2018-07-17: 1 via ORAL
  Filled 2018-07-17: qty 1

## 2018-07-17 MED ORDER — FENTANYL CITRATE (PF) 100 MCG/2ML IJ SOLN
INTRAMUSCULAR | Status: AC
  Filled 2018-07-17: qty 2

## 2018-07-17 MED ORDER — ROCURONIUM BROMIDE 10 MG/ML (PF) SYRINGE
PREFILLED_SYRINGE | INTRAVENOUS | Status: AC
  Filled 2018-07-17: qty 10

## 2018-07-17 MED ORDER — HYDROMORPHONE HCL 1 MG/ML IJ SOLN
0.2500 mg | INTRAMUSCULAR | Status: DC | PRN
  Filled 2018-07-17: qty 0.5

## 2018-07-17 MED ORDER — PHENYLEPHRINE 40 MCG/ML (10ML) SYRINGE FOR IV PUSH (FOR BLOOD PRESSURE SUPPORT)
PREFILLED_SYRINGE | INTRAVENOUS | Status: AC
  Filled 2018-07-17: qty 10

## 2018-07-17 MED ORDER — SUCCINYLCHOLINE CHLORIDE 200 MG/10ML IV SOSY
PREFILLED_SYRINGE | INTRAVENOUS | Status: AC
Start: 2018-07-17 — End: ?
  Filled 2018-07-17: qty 10

## 2018-07-17 MED ORDER — SUCCINYLCHOLINE CHLORIDE 200 MG/10ML IV SOSY
PREFILLED_SYRINGE | INTRAVENOUS | Status: DC | PRN
  Administered 2018-07-17: 120 mg via INTRAVENOUS

## 2018-07-17 MED ORDER — FLEET ENEMA 7-19 GM/118ML RE ENEM
1.0000 | ENEMA | Freq: Once | RECTAL | Status: AC
  Administered 2018-07-17: 1 via RECTAL
  Filled 2018-07-17: qty 1

## 2018-07-17 MED ORDER — DEXAMETHASONE SODIUM PHOSPHATE 4 MG/ML IJ SOLN
INTRAMUSCULAR | Status: DC | PRN
  Administered 2018-07-17: 10 mg via INTRAVENOUS

## 2018-07-17 MED ORDER — ONDANSETRON HCL 4 MG/2ML IJ SOLN
INTRAMUSCULAR | Status: DC | PRN
  Administered 2018-07-17: 4 mg via INTRAVENOUS

## 2018-07-17 MED ORDER — PHENYLEPHRINE 40 MCG/ML (10ML) SYRINGE FOR IV PUSH (FOR BLOOD PRESSURE SUPPORT)
PREFILLED_SYRINGE | INTRAVENOUS | Status: DC | PRN
  Administered 2018-07-17: 200 ug via INTRAVENOUS
  Administered 2018-07-17: 120 ug via INTRAVENOUS
  Administered 2018-07-17: 80 ug via INTRAVENOUS

## 2018-07-17 MED ORDER — ONDANSETRON HCL 4 MG/2ML IJ SOLN
4.0000 mg | Freq: Once | INTRAMUSCULAR | Status: DC | PRN
  Filled 2018-07-17: qty 2

## 2018-07-17 MED ORDER — LIDOCAINE 2% (20 MG/ML) 5 ML SYRINGE
INTRAMUSCULAR | Status: DC | PRN
  Administered 2018-07-17: 80 mg via INTRAVENOUS

## 2018-07-17 MED ORDER — DEXAMETHASONE SODIUM PHOSPHATE 10 MG/ML IJ SOLN
INTRAMUSCULAR | Status: AC
  Filled 2018-07-17: qty 1

## 2018-07-17 MED ORDER — MIDAZOLAM HCL 5 MG/5ML IJ SOLN
INTRAMUSCULAR | Status: DC | PRN
  Administered 2018-07-17: 2 mg via INTRAVENOUS

## 2018-07-17 MED ORDER — CEFAZOLIN SODIUM-DEXTROSE 2-4 GM/100ML-% IV SOLN
INTRAVENOUS | Status: AC
  Filled 2018-07-17: qty 100

## 2018-07-17 MED ORDER — PROPOFOL 10 MG/ML IV BOLUS
INTRAVENOUS | Status: AC
  Filled 2018-07-17: qty 40

## 2018-07-17 MED ORDER — MIDAZOLAM HCL 2 MG/2ML IJ SOLN
INTRAMUSCULAR | Status: AC
  Filled 2018-07-17: qty 2

## 2018-07-17 MED ORDER — SODIUM CHLORIDE 0.9 % IJ SOLN
INTRAMUSCULAR | Status: DC | PRN
  Administered 2018-07-17: 10 mL

## 2018-07-17 MED ORDER — LIDOCAINE 2% (20 MG/ML) 5 ML SYRINGE
INTRAMUSCULAR | Status: AC
  Filled 2018-07-17: qty 5

## 2018-07-17 MED ORDER — ONDANSETRON HCL 4 MG/2ML IJ SOLN
INTRAMUSCULAR | Status: AC
  Filled 2018-07-17: qty 2

## 2018-07-17 MED ORDER — SODIUM CHLORIDE 0.9 % IR SOLN
Status: DC | PRN
  Administered 2018-07-17: 1000 mL via INTRAVESICAL

## 2018-07-17 MED ORDER — IOHEXOL 300 MG/ML  SOLN
INTRAMUSCULAR | Status: DC | PRN
  Administered 2018-07-17: 7 mL

## 2018-07-17 MED ORDER — MEPERIDINE HCL 25 MG/ML IJ SOLN
6.2500 mg | INTRAMUSCULAR | Status: DC | PRN
  Filled 2018-07-17: qty 1

## 2018-07-17 MED ORDER — SUGAMMADEX SODIUM 200 MG/2ML IV SOLN
INTRAVENOUS | Status: AC
  Filled 2018-07-17: qty 2

## 2018-07-17 MED ORDER — PHENYLEPHRINE HCL 10 MG/ML IJ SOLN
INTRAMUSCULAR | Status: AC
  Filled 2018-07-17: qty 2

## 2018-07-17 SURGICAL SUPPLY — 38 items
BAG URINE DRAINAGE (UROLOGICAL SUPPLIES) ×4 IMPLANT
BLADE CLIPPER SURG (BLADE) ×4 IMPLANT
CATH FOLEY 2WAY SLVR  5CC 16FR (CATHETERS) ×1
CATH FOLEY 2WAY SLVR 5CC 16FR (CATHETERS) ×3 IMPLANT
CATH ROBINSON RED A/P 16FR (CATHETERS) IMPLANT
CATH ROBINSON RED A/P 20FR (CATHETERS) ×4 IMPLANT
CLOTH BEACON ORANGE TIMEOUT ST (SAFETY) ×4 IMPLANT
CONT SPECI 4OZ STER CLIK (MISCELLANEOUS) ×8 IMPLANT
COVER BACK TABLE 60X90IN (DRAPES) ×4 IMPLANT
COVER MAYO STAND STRL (DRAPES) ×4 IMPLANT
DRSG TEGADERM 4X4.75 (GAUZE/BANDAGES/DRESSINGS) ×7 IMPLANT
DRSG TEGADERM 8X12 (GAUZE/BANDAGES/DRESSINGS) ×7 IMPLANT
GAUZE SPONGE 4X4 12PLY STRL (GAUZE/BANDAGES/DRESSINGS) ×1 IMPLANT
GLOVE BIO SURGEON STRL SZ 6 (GLOVE) IMPLANT
GLOVE BIO SURGEON STRL SZ 6.5 (GLOVE) ×2 IMPLANT
GLOVE BIO SURGEON STRL SZ7 (GLOVE) IMPLANT
GLOVE BIO SURGEON STRL SZ8 (GLOVE) ×8 IMPLANT
GLOVE BIOGEL PI IND STRL 6 (GLOVE) IMPLANT
GLOVE BIOGEL PI IND STRL 6.5 (GLOVE) IMPLANT
GLOVE BIOGEL PI IND STRL 8 (GLOVE) IMPLANT
GLOVE BIOGEL PI INDICATOR 6 (GLOVE)
GLOVE BIOGEL PI INDICATOR 6.5 (GLOVE) ×2
GLOVE BIOGEL PI INDICATOR 8 (GLOVE)
GLOVE ECLIPSE 8.0 STRL XLNG CF (GLOVE) ×6 IMPLANT
GLOVE INDICATOR 7.0 STRL GRN (GLOVE) IMPLANT
GOWN STRL REUS W/TWL XL LVL3 (GOWN DISPOSABLE) ×4 IMPLANT
HOLDER FOLEY CATH W/STRAP (MISCELLANEOUS) ×4 IMPLANT
I-SEED AGX100 ×77 IMPLANT
IMPL SPACEOAR SYSTEM 10ML (Spacer) ×3 IMPLANT
IMPLANT SPACEOAR SYSTEM 10ML (Spacer) ×4 IMPLANT
IV NS 1000ML (IV SOLUTION) ×4
IV NS 1000ML BAXH (IV SOLUTION) ×3 IMPLANT
KIT TURNOVER CYSTO (KITS) ×4 IMPLANT
MARKER SKIN DUAL TIP RULER LAB (MISCELLANEOUS) ×4 IMPLANT
PACK CYSTO (CUSTOM PROCEDURE TRAY) ×4 IMPLANT
SYR 10ML LL (SYRINGE) ×4 IMPLANT
UNDERPAD 30X30 (UNDERPADS AND DIAPERS) ×8 IMPLANT
WATER STERILE IRR 500ML POUR (IV SOLUTION) ×4 IMPLANT

## 2018-07-17 NOTE — Op Note (Signed)
Preoperative diagnosis: Clinical stage TI C adenocarcinoma the prostate   Postoperative diagnosis: Same   Procedure: I-125 prostate seed implantation with Nucletron robotic implanter, placement of SpaceOAR,  flexible cystoscopy  Surgeon: Lillette Boxer. Allien Melberg M.D.  Radiation Oncologist: Tyler Pita, M.D.  Anesthesia: Gen.   Indications: Patient  was diagnosed with clinical stage TI C prostate cancer. We had extensive discussion with him about treatment options versus. He elected to proceed with seed implantation. He underwent consultation my office as well as with Dr. Tammi Klippel. He appeared to understand the advantages disadvantages potential risks of this treatment option. Full informed consent has been obtained.   Technique and findings: Patient was brought the operating room where he had successful induction of general anesthesia. He was placed in dorso-lithotomy position and prepped and draped in usual manner. Appropriate surgical timeout was performed. Radiation oncology department placed a transrectal ultrasound probe anchoring stand. Foley catheter with contrast in the balloon was inserted without difficulty. Anchoring needles were placed within the prostate. Rectal tube was placed. Real-time contouring of the urethra prostate and rectum were performed and the dosing parameters were established. Targeted dose was 145 gray.  I was then called  to the operating suite suite for placement of the needles. A second timeout was performed. All needle passage was done with real-time transrectal ultrasound guidance with the sagittal plane. A total of 24 needles were placed. The implantation itself was done with the robotic implanter. 77 active seeds were implanted.  I then proceeded with placement of SpaceOARby introducing a needle with the bevel angled inferiorly approximately 2 cm superior to the anus. This was angled downward and under direct ultrasound was placed within the space between the  prostatic capsule and rectum. This was confirmed with a small amount of sterile saline injected and this was performed under direct ultrasound. I then attached the SpaceOARto the needle and injected this in the space between the prostate and rectum with good placement noted. The Foley catheter was removed and flexible cystoscopy failed to show any seeds outside the prostate.  Bladder urothelium was normal, U/Os normal, prostate nonobstructive. The patient was brought to recovery room in stable condition, having tolerated the procedure well.Marland Kitchen

## 2018-07-17 NOTE — Discharge Instructions (Signed)
Radioactive Seed Implant Home Care Instructions   Activity:    Rest for the remainder of the day.  Do not drive or operate equipment today.  You may resume normal  activities in a few days as instructed by your physician, without risk of harmful radiation exposure to those around you, provided you follow the time and distance precautions on the Radiation Oncology Instruction Sheet.   Meals: Drink plenty of lipuids and eat light foods, such as gelatin or soup this evening .  You may return to normal meal plan tomorrow.  Return To Work: You may return to work as instructed by Naval architect.  Special Instruction:   If any seeds are found, use tweezers to pick up seeds and place in a glass container of any kind and bring to your physician's office.  Call your physician if any of these symptoms occur:   Persistent or heavy bleeding  Urine stream diminishes or stops completely after catheter is removed  Fever equal to or greater than 101 degrees F  Cloudy urine with a strong foul odor  Severe pain  You may feel some burning pain and/or hesitancy when you urinate after the catheter is removed.  These symptoms may increase over the next few weeks, but should diminish within forur to six weeks.  Applying moist heat to the lower abdomen or a hot tub bath may help relieve the pain.  If the discomfort becomes severe, please call your physician for additional medications.   Post Anesthesia Home Care Instructions  Activity: Get plenty of rest for the remainder of the day. A responsible adult should stay with you for 24 hours following the procedure.  For the next 24 hours, DO NOT: -Drive a car -Paediatric nurse -Drink alcoholic beverages -Take any medication unless instructed by your physician -Make any legal decisions or sign important papers.  Meals: Start with liquid foods such as gelatin or soup. Progress to regular foods as tolerated. Avoid greasy, spicy, heavy foods. If nausea  and/or vomiting occur, drink only clear liquids until the nausea and/or vomiting subsides. Call your physician if vomiting continues.  Special Instructions/Symptoms: Your throat may feel dry or sore from the anesthesia or the breathing tube placed in your throat during surgery. If this causes discomfort, gargle with warm salt water. The discomfort should disappear within 24 hours.  If you had a scopolamine patch placed behind your ear for the management of post- operative nausea and/or vomiting:  1. The medication in the patch is effective for 72 hours, after which it should be removed.  Wrap patch in a tissue and discard in the trash. Wash hands thoroughly with soap and water. 2. You may remove the patch earlier than 72 hours if you experience unpleasant side effects which may include dry mouth, dizziness or visual disturbances. 3. Avoid touching the patch. Wash your hands with soap and water after contact with the patch.

## 2018-07-17 NOTE — Interval H&P Note (Signed)
History and Physical Interval Note:  07/17/2018 7:33 AM  Chris [name]  has presented today for surgery, with the diagnosis of PROSTATE CANCER  The various methods of treatment have been discussed with the patient and family. After consideration of risks, benefits and other options for treatment, the patient has consented to  Procedure(s): RADIOACTIVE SEED IMPLANT/BRACHYTHERAPY IMPLANT (N/A) SPACE OAR INSTILLATION (N/A) as a surgical intervention .  The patient's history has been reviewed, patient examined, no change in status, stable for surgery.  I have reviewed the patient's chart and labs.  Questions were answered to the patient's satisfaction.     Lillette Boxer Cristol Engdahl

## 2018-07-17 NOTE — Transfer of Care (Signed)
Immediate Anesthesia Transfer of Care Note  Patient: Chris [name]  Procedure(s) Performed: Procedure(s) (LRB): RADIOACTIVE SEED IMPLANT/BRACHYTHERAPY IMPLANT (N/A) SPACE OAR INSTILLATION (N/A) CYSTOSCOPY (N/A)  Patient Location: PACU  Anesthesia Type: General  Level of Consciousness: awake, oriented, sedated and patient cooperative  Airway & Oxygen Therapy: Patient Spontanous Breathing and Patient connected to face mask oxygen  Post-op Assessment: Report given to PACU RN and Post -op Vital signs reviewed and stable  Post vital signs: Reviewed and stable  Complications: No apparent anesthesia complications Last Vitals:  Vitals Value Taken Time  BP    Temp    Pulse 88 07/17/2018  9:16 AM  Resp 19 07/17/2018  9:16 AM  SpO2 95 % 07/17/2018  9:16 AM  Vitals shown include unvalidated device data.  Last Pain:  Vitals:   07/17/18 0633  TempSrc:   PainSc: 0-No pain      Patients Stated Pain Goal: 5 (07/17/18 2426)

## 2018-07-17 NOTE — Anesthesia Procedure Notes (Signed)
Procedure Name: Intubation Date/Time: 07/17/2018 7:54 AM Performed by: Josephine Igo, MD Pre-anesthesia Checklist: Patient identified, Emergency Drugs available, Suction available and Patient being monitored Patient Re-evaluated:Patient Re-evaluated prior to induction Oxygen Delivery Method: Circle system utilized Preoxygenation: Pre-oxygenation with 100% oxygen Induction Type: IV induction, Rapid sequence and Cricoid Pressure applied Ventilation: Mask ventilation without difficulty Laryngoscope Size: Mac and 4 Grade View: Grade I Tube type: Oral Tube size: 7.5 mm Number of attempts: 1 Airway Equipment and Method: Stylet and Oral airway Placement Confirmation: ETT inserted through vocal cords under direct vision,  positive ETCO2 and breath sounds checked- equal and bilateral Secured at: 22 cm Tube secured with: Tape Dental Injury: Teeth and Oropharynx as per pre-operative assessment

## 2018-07-17 NOTE — Anesthesia Postprocedure Evaluation (Signed)
Anesthesia Post Note  Patient: Chris [name]  Procedure(s) Performed: RADIOACTIVE SEED IMPLANT/BRACHYTHERAPY IMPLANT (N/A Prostate) SPACE OAR INSTILLATION (N/A Rectum) CYSTOSCOPY (N/A Bladder)     Patient location during evaluation: PACU Anesthesia Type: General Level of consciousness: awake and alert Pain management: pain level controlled Vital Signs Assessment: post-procedure vital signs reviewed and stable Respiratory status: spontaneous breathing, nonlabored ventilation and respiratory function stable Cardiovascular status: blood pressure returned to baseline and stable Postop Assessment: no apparent nausea or vomiting Anesthetic complications: no    Last Vitals:  Vitals:   07/17/18 0945 07/17/18 1000  BP: (!) 146/93 (!) 148/87  Pulse: 76 79  Resp: 12 10  Temp:    SpO2: (!) 89% 94%    Last Pain:  Vitals:   07/17/18 1015  TempSrc:   PainSc: 1                  Hershel Corkery A.

## 2018-07-17 NOTE — H&P (Signed)
H&P  Chief Complaint: Prostate cancer  History of Present Illness: 57 year old male with prostate cancer presents for I125 brachytherapy and SpaceOAR.  01/01/2018: 57 year old male presented for evaluation and management of elevation of his PSA. Prior to this visit, he was seen by Dr. John Giovanni in [place] for similar issues. I have taken care of the patient's father-in-law in the past. He is referred by Dr. Rosario Jacks.  PSA levels have been as follows:  02/04/2015--3.10  09/09/2017--4.80  11/18/2017--5.4  12/13/2017--5.10  Dr. Bernardo Heater properly recommended ultrasound and biopsy to the pt , who is quite hesitant to undergo any testing at this time. He did offer to have an 4K test done--resulting score was 33--high risk.  He denies family history of prostate cancer or other prostatic issues.  Medically speaking he had stents placed in 2007 and is on Plavix.  TRUS/Bx on 01/28/2018: PSA 5.4, prostatic volume 40.3 ml 0.13.  3/12 cores revealed adenocarcinoma:  2 cores (left mid medial, left apex medial) revealed Gleason 3+4 = 7 adenocarcinoma, each showing 25% core involvement  1 core (left apex lateral) revealed Gleason 3+3 = 6 adenocarcinoma, 20% involvement.   SHIM score 5  IPSS 4    Past Medical History:  Diagnosis Date  . Complication of anesthesia    wife states very anxious may need pre sedation  . Coronary artery disease   . GERD (gastroesophageal reflux disease)   . Hyperlipidemia   . MI (myocardial infarction) ([place]) 04/17/2006   Acute inferolateral wall MI with cardiogenic shock and complete heart block  . Prostate cancer ([place])   . Tobacco abuse   . Wears glasses   . Wears partial dentures    Upper    Past Surgical History:  Procedure Laterality Date  . CARDIAC CATHETERIZATION  2007   left, RCA 100% occluded ruptured plaque with thrombus in the proximal segment  . PROSTATE BIOPSY    . WRIST SURGERY  10/04/2012    Home Medications:    Allergies: No Known  Allergies  Family History  Problem Relation Age of Onset  . Prostate cancer Neg Hx   . Kidney cancer Neg Hx   . Cancer Neg Hx     Social History:  reports that he has been smoking cigarettes. He has a 84.00 pack-year smoking history. He has never used smokeless tobacco. He reports that he does not drink alcohol or use drugs.  ROS: A complete review of systems was performed.  All systems are negative except for pertinent findings as noted.  Physical Exam:  Vital signs in last 24 hours: Temp:  [98.5 F (36.9 C)] 98.5 F (36.9 C) (09/19 0547) Pulse Rate:  [73] 73 (09/19 0547) Resp:  [16] 16 (09/19 0547) BP: (163)/(104) 163/104 (09/19 0547) SpO2:  [99 %] 99 % (09/19 0547) Weight:  [104.2 kg] 104.2 kg (09/19 0547) Constitutional:  Alert and oriented, No acute distress Cardiovascular: Regular rate  Respiratory: Normal respiratory effort GI: Abdomen is soft, nontender, nondistended, no abdominal masses Genitourinary: No CVAT. Normal male phallus, testes are descended bilaterally and non-tender and without masses, scrotum is normal in appearance without lesions or masses, perineum is normal on inspection. Lymphatic: No lymphadenopathy Neurologic: Grossly intact, no focal deficits Psychiatric: Normal mood and affect  Laboratory Data:  No results for input(s): WBC, HGB, HCT, PLT in the last 72 hours.  No results for input(s): NA, K, CL, GLUCOSE, BUN, CALCIUM, CREATININE in the last 72 hours.  Invalid input(s): CO3   No results found  for this or any previous visit (from the past 24 hour(s)). No results found for this or any previous visit (from the past 240 hour(s)).  Renal Function: Recent Labs    07/10/18 1356  CREATININE 1.03   Estimated Creatinine Clearance: 95.7 mL/min (by C-G formula based on SCr of 1.03 mg/dL).  Radiologic Imaging: No results found.  Impression/Assessment:  PCa Plan:  I-125 brachytherapy and SpcaeOAR

## 2018-07-18 ENCOUNTER — Encounter (HOSPITAL_BASED_OUTPATIENT_CLINIC_OR_DEPARTMENT_OTHER): Payer: Self-pay | Admitting: Urology

## 2018-07-18 NOTE — Progress Notes (Signed)
  Radiation Oncology         [phone] ________________________________  Name: ENGELBERT SEVIN MRN: [id number]  Date: 07/18/2018  DOB: 1961/01/27       Prostate Seed Implant  YI:RSWNIO, [name], MD  No ref. provider found  DIAGNOSIS:  57 y.o. male with Stage T1c adenocarcinoma of the prostate with Gleason Score of 3+4, and PSA of 5.1.    ICD-10-CM   1. Pre-op testing Z01.818 DG Chest 2 View    DG Chest 2 View   PROCEDURE: Insertion of radioactive I-125 seeds into the prostate gland.  RADIATION DOSE: 145 Gy, definitive/boost therapy.  TECHNIQUE: JULIUS MATUS was brought to the operating room with the urologist. He was placed in the dorsolithotomy position. He was catheterized and a rectal tube was inserted. The perineum was shaved, prepped and draped. The ultrasound probe was then introduced into the rectum to see the prostate gland.  TREATMENT DEVICE: A needle grid was attached to the ultrasound probe stand and anchor needles were placed.  3D PLANNING: The prostate was imaged in 3D using a sagittal sweep of the prostate probe. These images were transferred to the planning computer. There, the prostate, urethra and rectum were defined on each axial reconstructed image. Then, the software created an optimized 3D plan and a few seed positions were adjusted. The quality of the plan was reviewed using [facility] information for the target and the following two organs at risk:  Urethra and Rectum.  Then the accepted plan was printed and handed off to the radiation therapist.  Under my supervision, the custom loading of the seeds and spacers was carried out and loaded into sealed vicryl sleeves.  These pre-loaded needles were then placed into the needle holder.Marland Kitchen  PROSTATE VOLUME STUDY:  Using transrectal ultrasound the volume of the prostate was verified to be 46 cc.  SPECIAL TREATMENT PROCEDURE/SUPERVISION AND HANDLING: The pre-loaded needles were then delivered under sagittal guidance. A total  of 24 needles were used to deposit 77 seeds in the prostate gland. The individual seed activity was 0.452 mCi.  SpaceOAR:  Yes  COMPLEX SIMULATION: At the end of the procedure, an anterior radiograph of the pelvis was obtained to document seed positioning and count. Cystoscopy was performed to check the urethra and bladder.  MICRODOSIMETRY: At the end of the procedure, the patient was emitting 0.048 mR/hr at 1 meter. Accordingly, he was considered safe for hospital discharge.  PLAN: The patient will return to the radiation oncology clinic for post implant CT dosimetry in three weeks.   ________________________________  Sheral Apley Tammi Klippel, M.D.

## 2018-07-25 NOTE — Addendum Note (Signed)
Addendum  created 07/25/18 0738 by Suan Halter, CRNA   Charge Capture section accepted

## 2018-07-31 ENCOUNTER — Telehealth: Payer: Self-pay | Admitting: *Deleted

## 2018-07-31 NOTE — Telephone Encounter (Signed)
Called patient to remind of post seed appts. and MRI for 08-01-18, lvm for a return call

## 2018-08-01 ENCOUNTER — Ambulatory Visit
Admission: RE | Admit: 2018-08-01 | Discharge: 2018-08-01 | Disposition: A | Discharge status: Home / Self Care | Payer: 59 | Source: Ambulatory Visit | Attending: Radiation Oncology | Admitting: Radiation Oncology

## 2018-08-01 ENCOUNTER — Ambulatory Visit
Admission: RE | Admit: 2018-08-01 | Discharge: 2018-08-01 | Disposition: A | Discharge status: Home / Self Care | Payer: 59 | Source: Ambulatory Visit | Attending: Urology | Admitting: Urology

## 2018-08-01 ENCOUNTER — Encounter: Payer: Self-pay | Admitting: Medical Oncology

## 2018-08-01 ENCOUNTER — Encounter: Payer: Self-pay | Admitting: Radiation Oncology

## 2018-08-01 ENCOUNTER — Other Ambulatory Visit: Payer: Self-pay

## 2018-08-01 ENCOUNTER — Ambulatory Visit (HOSPITAL_COMMUNITY): Admission: RE | Admit: 2018-08-01 | Payer: 59 | Source: Ambulatory Visit

## 2018-08-01 DIAGNOSIS — Z79899 Other long term (current) drug therapy: Secondary | ICD-10-CM | POA: Insufficient documentation

## 2018-08-01 DIAGNOSIS — Z923 Personal history of irradiation: Secondary | ICD-10-CM | POA: Diagnosis not present

## 2018-08-01 DIAGNOSIS — C61 Malignant neoplasm of prostate: Secondary | ICD-10-CM | POA: Insufficient documentation

## 2018-08-01 NOTE — Progress Notes (Signed)
Radiation Oncology         [phone]) [phone] ________________________________  Name: Chris [name] MRN: [id number]  Date: 08/01/2018  DOB: [birth year]  Follow-Up Visit Note  CC: Casilda Carls, MD  Franchot Gallo, MD  Diagnosis:   57 y.o. male with Stage T1c adenocarcinoma of the prostate with Gleason Score of 3+4, and PSA of 5.1.  No diagnosis found.  Interval Since Last Radiation:  2 weeks  07/17/18: Insertion of radioactive I-125 seeds into the prostate gland;145 Gy, definitive therapy with SpaceOAR gel placement.  Narrative:  The patient returns today for routine follow-up.  He is complaining of increased urinary frequency and urinary hesitation symptoms. He filled out a questionnaire regarding urinary function today providing and overall IPSS score of 18 characterizing his symptoms as moderate despite taking Flomax daily.  He does feel that his urinary symptoms are gradually improving.  He continues with moderate fatigue but admits that he has been fighting off a head cold for the past week.  His pre-implant score was 4. He denies any bowel symptoms.  ALLERGIES:  has No Known Allergies.  Meds: Current Outpatient Medications  Medication Sig Dispense Refill  . atorvastatin (LIPITOR) 20 MG tablet Take 1 tablet (20 mg total) by mouth daily. 90 tablet 3  . clopidogrel (PLAVIX) 75 MG tablet TAKE 1 TABLET (75 MG TOTAL) BY MOUTH DAILY. <PLEASE MAKE APPOINTMENT FOR REFILLS> 5 tablet 0  . ergocalciferol (VITAMIN D2) 50000 units capsule Take 50,000 Units by mouth once a week.    . metoprolol tartrate (LOPRESSOR) 25 MG tablet Take 1 tablet (25 mg total) by mouth daily. Need appointment before further refills 30 tablet 0  . pantoprazole (PROTONIX) 40 MG tablet Take 40 mg by mouth daily.    . tamsulosin (FLOMAX) 0.4 MG CAPS capsule Take 1 capsule (0.4 mg total) by mouth daily. 30 capsule 3   No current facility-administered medications for this encounter.     Physical Findings:   height is  5\' 11"  (1.803 m) and weight is 227 lb (103 kg). His oral temperature is 98.2 F (36.8 C). His blood pressure is 146/95 (abnormal) and his pulse is 81. His oxygen saturation is 97%. .   In general this is a well appearing Caucasian male in no acute distress.  He's alert and oriented x4 and appropriate throughout the examination. Cardiopulmonary assessment is negative for acute distress and he exhibits normal effort.    Lab Findings: Lab Results  Component Value Date   WBC 6.7 07/10/2018   HGB 16.4 07/10/2018   HCT 47.9 07/10/2018   MCV 89.0 07/10/2018   PLT 233 07/10/2018    Radiographic Findings:  Patient underwent CT imaging in our clinic for post implant dosimetry.  Dr. Tammi Klippel has personally reviewed the CT images which appear to demonstrate an adequate distribution of radioactive seeds throughout the prostate gland. There are no seeds in or near the rectum.  He was scheduled for an MRI prostate following his visit today but does not feel up to it and request that we reschedule the scan to follow his follow-up visit with Dr. Diona Fanti on 08/07/2018.  Once the MRI has been completed, these images will be fused with the CT images for further evaluation.  We suspect the final radiation plan and dosimetry will show appropriate coverage of the prostate gland.   Impression: The patient is recovering from the effects of radiation. His urinary symptoms should gradually improve over the next 4-6 months. We talked about this today. He  is encouraged by his improvement already and is otherwise pleased.  Plan: Today, I spent time talking to the patient about his prostate seed implant and resolving urinary symptoms. We also talked about long-term follow-up for prostate cancer following seed implant. He understands that ongoing PSA determinations and digital rectal exams will help perform surveillance to rule out disease recurrence.  He has a scheduled follow-up visit with Dr. Diona Fanti on 08/07/2018. He  understands what to expect with his PSA measures. Patient was also educated today about some of the long-term effects from radiation including a small risk for rectal bleeding and possibly erectile dysfunction. We talked about some of the general management approaches to these potential complications. However, I did encourage the patient to contact our office or return at any point if he has questions or concerns related to his previous radiation and prostate cancer.    Nicholos Johns, PA-C    Tyler Pita, MD  [facility] Direct Dial: [phone]  Fax: [phone] [email or web address]  Skype  [email or web address]  This document serves as a record of services personally performed by Tyler Pita, MD. It was created on his behalf by Wilburn Mylar, a trained medical scribe. The creation of this record is based on the scribe's personal observations and the provider's statements to them. This document has been checked and approved by the attending provider.

## 2018-08-01 NOTE — Progress Notes (Signed)
  Radiation Oncology         [phone]) [phone] ________________________________  Name: Chris [name] MRN: [id number]  Date: 08/01/2018  DOB: Dec 24, 1960  COMPLEX SIMULATION NOTE  NARRATIVE:  The patient was brought to the [facility] today following prostate seed implantation approximately one month ago.  Identity was confirmed.  All relevant records and images related to the planned course of therapy were reviewed.  Then, the patient was set-up supine.  CT images were obtained.  The CT images were loaded into the planning software.  Then the prostate and rectum were contoured.  Treatment planning then occurred.  The implanted iodine 125 seeds were identified by the physics staff for projection of radiation distribution  I have requested : 3D Simulation  I have requested a DVH of the following structures: Prostate and rectum.    ________________________________  Sheral Apley Tammi Klippel, M.D.  This document serves as a record of services personally performed by Tyler Pita, MD. It was created on his behalf by Wilburn Mylar, a trained medical scribe. The creation of this record is based on the scribe's personal observations and the provider's statements to them. This document has been checked and approved by the attending provider.

## 2018-08-04 ENCOUNTER — Telehealth: Payer: Self-pay | Admitting: *Deleted

## 2018-08-04 NOTE — Telephone Encounter (Signed)
CALLED PATIENT TO INFORM OF MRI FOR 08-07-18 - ARRIVAL TIME - 6:30 PM @ [facility], TEST TO BEGIN @ 7PM, SPOKE WITH PATIENT'S WIFE ELIZABETH AND SHE IS AWARE OF THIS TEST

## 2018-08-05 ENCOUNTER — Telehealth: Payer: Self-pay | Admitting: *Deleted

## 2018-08-05 NOTE — Telephone Encounter (Signed)
Called patient to inform that MRI has been moved to 08-11-18 - arrival time- 12:30 pm, no restrictions to test, spoke with patient's wife- Chris [name] and she is aware of this test.

## 2018-08-07 ENCOUNTER — Ambulatory Visit (HOSPITAL_COMMUNITY): Admission: RE | Admit: 2018-08-07 | Payer: 59 | Source: Ambulatory Visit

## 2018-08-11 ENCOUNTER — Ambulatory Visit (HOSPITAL_COMMUNITY)
Admission: RE | Admit: 2018-08-11 | Discharge: 2018-08-11 | Disposition: A | Discharge status: Home / Self Care | Payer: 59 | Source: Ambulatory Visit | Attending: Urology | Admitting: Urology

## 2018-08-11 DIAGNOSIS — C61 Malignant neoplasm of prostate: Secondary | ICD-10-CM | POA: Diagnosis present

## 2018-08-15 ENCOUNTER — Encounter: Payer: Self-pay | Admitting: Radiation Oncology

## 2018-08-15 DIAGNOSIS — C61 Malignant neoplasm of prostate: Secondary | ICD-10-CM | POA: Diagnosis not present

## 2018-09-28 NOTE — Progress Notes (Signed)
  Radiation Oncology         [phone]) [phone] ________________________________  Name: Chris [name] MRN: [id number]  Date: 08/15/2018  DOB: Aug 13, 1961  3D Planning Note   Prostate Brachytherapy Post-Implant Dosimetry  Diagnosis: 57 y.o. male with Stage T1c adenocarcinoma of the prostate with Gleason Score of 3+4, and PSA of 5.1   Narrative: On a previous date, Chris [name] returned following prostate seed implantation for post implant planning. He underwent CT scan complex simulation to delineate the three-dimensional structures of the pelvis and demonstrate the radiation distribution.  Since that time, the seed localization, and complex isodose planning with dose volume histograms have now been completed.  Results:   Prostate Coverage - The dose of radiation delivered to the 90% or more of the prostate gland (D90) was 102.22% of the prescription dose. This exceeds our goal of greater than 90%. Rectal Sparing - The volume of rectal tissue receiving the prescription dose or higher was 0.0 cc. This falls under our thresholds tolerance of 1.0 cc.  Impression: The prostate seed implant appears to show adequate target coverage and appropriate rectal sparing.  Plan:  The patient will continue to follow with urology for ongoing PSA determinations. I would anticipate a high likelihood for local tumor control with minimal risk for rectal morbidity.  ________________________________  Sheral Apley Tammi Klippel, M.D.

## 2019-03-17 ENCOUNTER — Telehealth: Payer: Self-pay | Admitting: *Deleted

## 2019-03-17 NOTE — Telephone Encounter (Signed)
A message was left, re: call our office

## 2019-04-17 ENCOUNTER — Encounter (HOSPITAL_BASED_OUTPATIENT_CLINIC_OR_DEPARTMENT_OTHER): Payer: Self-pay | Admitting: Urology

## 2019-08-24 ENCOUNTER — Other Ambulatory Visit: Payer: Self-pay

## 2019-08-24 DIAGNOSIS — Z20822 Contact with and (suspected) exposure to covid-19: Secondary | ICD-10-CM

## 2019-08-25 LAB — NOVEL CORONAVIRUS, NAA: SARS-CoV-2, NAA: NOT DETECTED

## 2019-10-02 ENCOUNTER — Other Ambulatory Visit: Payer: Self-pay

## 2019-10-02 DIAGNOSIS — Z20822 Contact with and (suspected) exposure to covid-19: Secondary | ICD-10-CM

## 2019-10-06 LAB — NOVEL CORONAVIRUS, NAA: SARS-CoV-2, NAA: NOT DETECTED

## 2020-12-21 ENCOUNTER — Telehealth: Payer: Self-pay | Admitting: Cardiovascular Disease

## 2020-12-21 NOTE — Telephone Encounter (Signed)
    Pt c/o BP issue: STAT if pt c/o blurred vision, one-sided weakness or slurred speech  1. What are your last 5 BP readings? 160/80  2. Are you having any other symptoms (ex. Dizziness, headache, blurred vision, passed out)? None   3. What is your BP issue? Pt's wife said pt's BP is getting high, she noticed it yesterday. No other symptoms. She scheduled pt with Dr. Gwenlyn Found in April, she just wanted to ask what they can do while pt is waiting for his appt. She only wants for pt to see Dr. Gwenlyn Found. She said to call her on her phone or work phone, or can send her a message on pt's mychart

## 2020-12-21 NOTE — Telephone Encounter (Signed)
Returned call to wife (ok per DPR)-she states she checked patients blood pressure last night it was elevated.   160/80 and 139/78.   They have not been checking until last night, unsure other readings.   He is currently taking metoprolol (Lopressor) 25mg  once daily?    He is traveling a lot currently and she believes he is not hydrating well.   Advised to check BP 2 times daily for the next week and update [place] with readings.  Also advised to monitor salt intake and hydrate.   Patient has not symptoms, no HA/blurred vision/SOB/CP.   Appt scheduled with Dr. Gwenlyn Found 3/18 at 3:15 pm, wife aware.

## 2020-12-26 MED ORDER — CLOPIDOGREL BISULFATE 75 MG PO TABS: ORAL_TABLET | ORAL | 0 refills | Status: DC

## 2020-12-26 MED ORDER — METOPROLOL TARTRATE 25 MG PO TABS: 25.0000 mg | ORAL_TABLET | Freq: Every day | ORAL | 0 refills | Status: DC

## 2021-01-05 ENCOUNTER — Emergency Department (HOSPITAL_COMMUNITY)
Admission: EM | Admit: 2021-01-05 | Discharge: 2021-01-05 | Disposition: A | Discharge status: Home / Self Care | Payer: BC Managed Care – PPO | Attending: Emergency Medicine | Admitting: Emergency Medicine

## 2021-01-05 ENCOUNTER — Emergency Department (HOSPITAL_COMMUNITY): Payer: BC Managed Care – PPO

## 2021-01-05 ENCOUNTER — Telehealth: Payer: Self-pay | Admitting: *Deleted

## 2021-01-05 ENCOUNTER — Other Ambulatory Visit: Payer: Self-pay

## 2021-01-05 DIAGNOSIS — I2510 Atherosclerotic heart disease of native coronary artery without angina pectoris: Secondary | ICD-10-CM | POA: Diagnosis not present

## 2021-01-05 DIAGNOSIS — R197 Diarrhea, unspecified: Secondary | ICD-10-CM | POA: Diagnosis not present

## 2021-01-05 DIAGNOSIS — Z7902 Long term (current) use of antithrombotics/antiplatelets: Secondary | ICD-10-CM | POA: Insufficient documentation

## 2021-01-05 DIAGNOSIS — R55 Syncope and collapse: Secondary | ICD-10-CM | POA: Insufficient documentation

## 2021-01-05 DIAGNOSIS — I10 Essential (primary) hypertension: Secondary | ICD-10-CM | POA: Insufficient documentation

## 2021-01-05 DIAGNOSIS — Z79899 Other long term (current) drug therapy: Secondary | ICD-10-CM | POA: Insufficient documentation

## 2021-01-05 DIAGNOSIS — R42 Dizziness and giddiness: Secondary | ICD-10-CM | POA: Diagnosis not present

## 2021-01-05 DIAGNOSIS — Z20822 Contact with and (suspected) exposure to covid-19: Secondary | ICD-10-CM | POA: Insufficient documentation

## 2021-01-05 DIAGNOSIS — Z8546 Personal history of malignant neoplasm of prostate: Secondary | ICD-10-CM | POA: Insufficient documentation

## 2021-01-05 DIAGNOSIS — F17210 Nicotine dependence, cigarettes, uncomplicated: Secondary | ICD-10-CM | POA: Diagnosis not present

## 2021-01-05 DIAGNOSIS — R112 Nausea with vomiting, unspecified: Secondary | ICD-10-CM | POA: Diagnosis not present

## 2021-01-05 DIAGNOSIS — Z951 Presence of aortocoronary bypass graft: Secondary | ICD-10-CM | POA: Diagnosis not present

## 2021-01-05 DIAGNOSIS — R110 Nausea: Secondary | ICD-10-CM

## 2021-01-05 LAB — COMPREHENSIVE METABOLIC PANEL
ALT: 27 U/L (ref 0–44)
AST: 23 U/L (ref 15–41)
Albumin: 4.1 g/dL (ref 3.5–5.0)
Alkaline Phosphatase: 70 U/L (ref 38–126)
Anion gap: 9 (ref 5–15)
BUN: 18 mg/dL (ref 6–20)
CO2: 22 mmol/L (ref 22–32)
Calcium: 9.4 mg/dL (ref 8.9–10.3)
Chloride: 108 mmol/L (ref 98–111)
Creatinine, Ser: 1.33 mg/dL — ABNORMAL HIGH (ref 0.61–1.24)
GFR, Estimated: >60 mL/min (ref >60)
Glucose, Bld: 114 mg/dL — ABNORMAL HIGH (ref 70–99)
Potassium: 3.7 mmol/L (ref 3.5–5.1)
Sodium: 139 mmol/L (ref 135–145)
Total Bilirubin: 1 mg/dL (ref 0.3–1.2)
Total Protein: 6.9 g/dL (ref 6.5–8.1)

## 2021-01-05 LAB — URINALYSIS, ROUTINE W REFLEX MICROSCOPIC
Bilirubin Urine: NEGATIVE
Glucose, UA: NEGATIVE mg/dL
Hgb urine dipstick: NEGATIVE
Ketones, ur: NEGATIVE mg/dL
Leukocytes,Ua: NEGATIVE
Nitrite: NEGATIVE
Protein, ur: NEGATIVE mg/dL
Specific Gravity, Urine: 1.023 (ref 1.005–1.030)
pH: 6 (ref 5.0–8.0)

## 2021-01-05 LAB — CBC WITH DIFFERENTIAL/PLATELET
Abs Immature Granulocytes: 0.1 10*3/uL — ABNORMAL HIGH (ref 0.00–0.07)
Basophils Absolute: 0 10*3/uL (ref 0.0–0.1)
Basophils Relative: 0 %
Eosinophils Absolute: 0 10*3/uL (ref 0.0–0.5)
Eosinophils Relative: 0 %
HCT: 56.2 % — ABNORMAL HIGH (ref 39.0–52.0)
Hemoglobin: 18.5 g/dL — ABNORMAL HIGH (ref 13.0–17.0)
Immature Granulocytes: 1 %
Lymphocytes Relative: 6 %
Lymphs Abs: 1.2 10*3/uL (ref 0.7–4.0)
MCH: 29.8 pg (ref 26.0–34.0)
MCHC: 32.9 g/dL (ref 30.0–36.0)
MCV: 90.6 fL (ref 80.0–100.0)
Monocytes Absolute: 1.2 10*3/uL — ABNORMAL HIGH (ref 0.1–1.0)
Monocytes Relative: 7 %
Neutro Abs: 16 10*3/uL — ABNORMAL HIGH (ref 1.7–7.7)
Neutrophils Relative %: 86 %
Platelets: 261 10*3/uL (ref 150–400)
RBC: 6.2 MIL/uL — ABNORMAL HIGH (ref 4.22–5.81)
RDW: 13.5 % (ref 11.5–15.5)
WBC: 18.5 10*3/uL — ABNORMAL HIGH (ref 4.0–10.5)
nRBC: 0 % (ref 0.0–0.2)

## 2021-01-05 LAB — RESP PANEL BY RT-PCR (FLU A&B, COVID) ARPGX2
Influenza A by PCR: NEGATIVE
Influenza B by PCR: NEGATIVE
SARS Coronavirus 2 by RT PCR: NEGATIVE

## 2021-01-05 LAB — CBG MONITORING, ED: Glucose-Capillary: 153 mg/dL — ABNORMAL HIGH (ref 70–99)

## 2021-01-05 LAB — TROPONIN I (HIGH SENSITIVITY)
Troponin I (High Sensitivity): 6 ng/L (ref <18)
Troponin I (High Sensitivity): 17 ng/L (ref <18)

## 2021-01-05 LAB — LIPASE, BLOOD: Lipase: 40 U/L (ref 11–51)

## 2021-01-05 LAB — BRAIN NATRIURETIC PEPTIDE: B Natriuretic Peptide: 20.8 pg/mL (ref 0.0–100.0)

## 2021-01-05 LAB — MAGNESIUM: Magnesium: 2 mg/dL (ref 1.7–2.4)

## 2021-01-05 IMAGING — DX DG CHEST 1V PORT
1 series · 1 of 1 positions shown · non-contrast
Comparison: Chest radiographs [DATE] and earlier.

CLINICAL DATA: 59-year-old male with near syncope. Dizziness.
Smoker.

EXAM:
PORTABLE CHEST 1 VIEW

[chest ap]
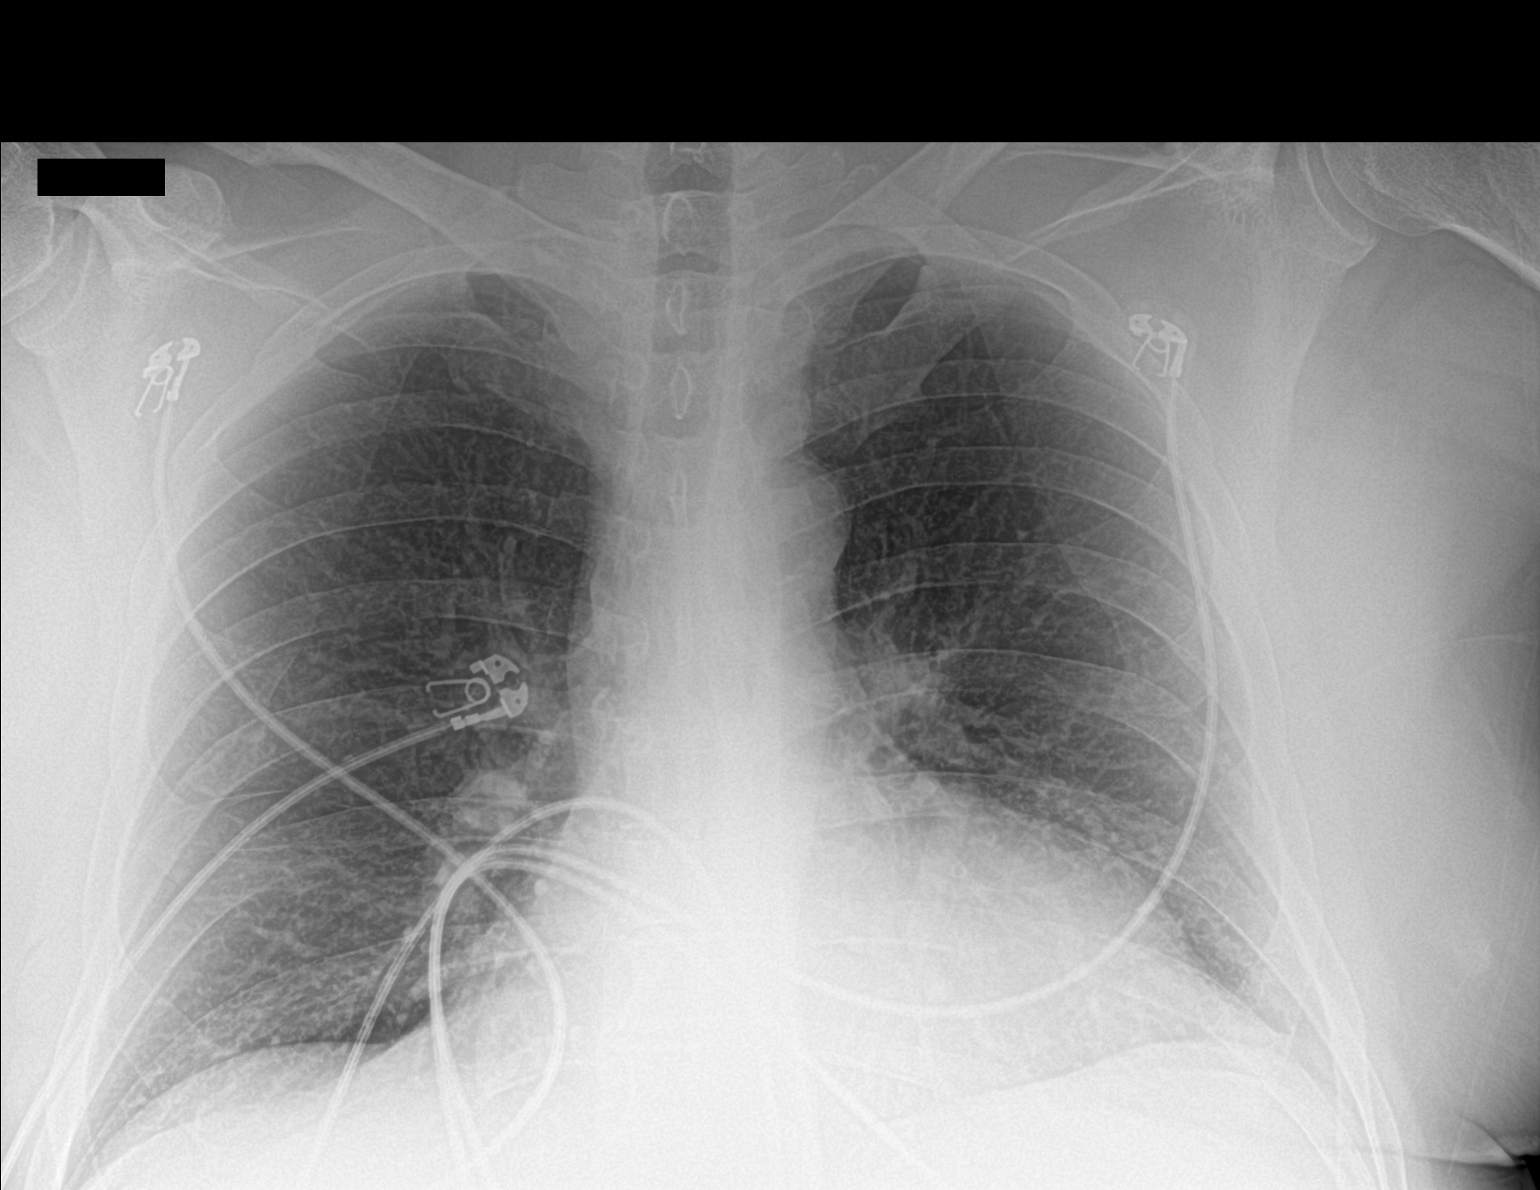

[1 of 1 positions shown; findings below may reference images not displayed]

FINDINGS: Portable AP upright view at [LH] hours. Lung volumes and mediastinal
contours not significantly changed since [LH]. Cardiac size within
normal limits. Visualized tracheal air column is within normal
limits. Mild chronic interstitial markings appear stable. Otherwise
when allowing for portable technique the lungs are clear. No
pneumothorax. No acute osseous abnormality identified.
IMPRESSION: No acute cardiopulmonary abnormality. Chronic pulmonary interstitial
changes.

## 2021-01-05 MED ORDER — SODIUM CHLORIDE 0.9 % IV BOLUS
500.0000 mL | Freq: Once | INTRAVENOUS | Status: AC
  Administered 2021-01-05: 500 mL via INTRAVENOUS

## 2021-01-05 MED ORDER — ONDANSETRON HCL 4 MG PO TABS: 4.0000 mg | ORAL_TABLET | Freq: Three times a day (TID) | ORAL | 0 refills | Status: DC | PRN

## 2021-01-05 NOTE — Telephone Encounter (Signed)
Patient wife transferred to triage, she reports this morning that the patient lost control of his bowel and then turned gray. She reports he acted like he was going to pass out. She called 911 and the patient refused to go to the ER because he only wants to see Dr Gwenlyn Found. The wife wanted to know if Dr Gwenlyn Found could see the patient in the ER. Explained to wife that dr berry is in the cath lab doing procedures today and would not be able to see the patient. She was encouraged to take the patient to the ER and let them know he is a berry patient and they will contact our group. She replied, I will try.

## 2021-01-05 NOTE — ED Provider Notes (Signed)
[facility] EMERGENCY DEPARTMENT Provider Note   CSN: [id number] Arrival date & time: 01/05/21  4034     History Chief Complaint  Patient presents with  . Near Syncope  . Nausea  . Diarrhea    Chris [name] is a 60 y.o. male.  The history is provided by the patient and medical records. No language interpreter was used.  Illness Location:  Lightheadedness and near syncope Quality:  Fatigue Severity:  Moderate Onset quality:  Gradual Timing:  Constant Progression:  Waxing and waning Chronicity:  New Associated symptoms: diarrhea, fatigue and nausea   Associated symptoms: no abdominal pain, no chest pain, no congestion, no fever, no headaches, no shortness of breath, no vomiting and no wheezing        Past Medical History:  Diagnosis Date  . Complication of anesthesia    wife states very anxious may need pre sedation  . Coronary artery disease   . GERD (gastroesophageal reflux disease)   . Hyperlipidemia   . MI (myocardial infarction) ([place]) 04/17/2006   Acute inferolateral wall MI with cardiogenic shock and complete heart block  . Prostate cancer ([place])   . Tobacco abuse   . Wears glasses   . Wears partial dentures    Upper    Patient Active Problem List   Diagnosis Date Noted  . Tobacco abuse 04/08/2018  . Malignant neoplasm of prostate ([place]) 03/25/2018  . Coronary artery disease 10/27/2013  . Hyperlipidemia 10/27/2013    Past Surgical History:  Procedure Laterality Date  . CARDIAC CATHETERIZATION  2007   left, RCA 100% occluded ruptured plaque with thrombus in the proximal segment  . CYSTOSCOPY N/A 07/17/2018   Procedure: CYSTOSCOPY;  Surgeon: Franchot Gallo, MD;  Location: [facility];  Service: Urology;  Laterality: N/A;  no seeds in bladder per Dr Diona Fanti  . PROSTATE BIOPSY    . RADIOACTIVE SEED IMPLANT N/A 07/17/2018   Procedure: RADIOACTIVE SEED IMPLANT/BRACHYTHERAPY IMPLANT;  Surgeon: Franchot Gallo, MD;   Location: [facility];  Service: Urology;  Laterality: N/A;  77 seeds  . SPACE OAR INSTILLATION N/A 07/17/2018   Procedure: SPACE OAR INSTILLATION;  Surgeon: Franchot Gallo, MD;  Location: [facility];  Service: Urology;  Laterality: N/A;  . WRIST SURGERY  10/04/2012       Family History  Problem Relation Age of Onset  . Prostate cancer Neg Hx   . Kidney cancer Neg Hx   . Cancer Neg Hx     Social History   Tobacco Use  . Smoking status: Current Some Day Smoker    Packs/day: 2.00    Years: 42.00    Pack years: 84.00    Types: Cigarettes  . Smokeless tobacco: Never Used  Vaping Use  . Vaping Use: Never used  Substance Use Topics  . Alcohol use: No  . Drug use: No    Home Medications Prior to Admission medications   Medication Sig Start Date End Date Taking? Authorizing Provider  atorvastatin (LIPITOR) 20 MG tablet Take 1 tablet (20 mg total) by mouth daily. 04/08/18   Lorretta Harp, MD  clopidogrel (PLAVIX) 75 MG tablet TAKE 1 TABLET (75 MG TOTAL) BY MOUTH DAILY. <PLEASE MAKE APPOINTMENT FOR REFILLS> 12/26/20   Lorretta Harp, MD  ergocalciferol (VITAMIN D2) 50000 units capsule Take 50,000 Units by mouth once a week.    [provider]  metoprolol tartrate (LOPRESSOR) 25 MG tablet Take 1 tablet (25 mg total) by mouth  daily. Need appointment before further refills 12/26/20   Lorretta Harp, MD  pantoprazole (PROTONIX) 40 MG tablet Take 40 mg by mouth daily.    [provider]  tamsulosin (FLOMAX) 0.4 MG CAPS capsule Take 1 capsule (0.4 mg total) by mouth daily. 12/02/17   Stoioff, Ronda Fairly, MD    Allergies    Patient has no known allergies.  Review of Systems   Review of Systems  Constitutional: Positive for fatigue. Negative for chills, diaphoresis and fever.  HENT: Negative for congestion.   Respiratory: Negative for chest tightness, shortness of breath, wheezing and stridor.   Cardiovascular: Negative for chest  pain, palpitations and leg swelling.  Gastrointestinal: Positive for diarrhea and nausea. Negative for abdominal pain, constipation and vomiting.  Genitourinary: Positive for decreased urine volume. Negative for dysuria, flank pain and frequency.  Musculoskeletal: Negative for back pain, neck pain and neck stiffness.  Neurological: Positive for light-headedness. Negative for dizziness, syncope, weakness and headaches.  Psychiatric/Behavioral: Negative for agitation.  All other systems reviewed and are negative.   Physical Exam Updated Vital Signs BP 138/84   Pulse 97   Temp (!) 97.5 F (36.4 C)   Resp 20   Ht 5\' 11"  (1.803 m)   Wt 108.9 kg   SpO2 96%   BMI 33.47 kg/m   Physical Exam Vitals and nursing note reviewed.  Constitutional:      General: He is not in acute distress.    Appearance: He is well-developed. He is not ill-appearing, toxic-appearing or diaphoretic.  HENT:     Head: Normocephalic and atraumatic.     Right Ear: External ear normal.     Left Ear: External ear normal.     Nose: Nose normal. No congestion or rhinorrhea.     Mouth/Throat:     Mouth: Mucous membranes are moist.     Pharynx: No oropharyngeal exudate or posterior oropharyngeal erythema.  Eyes:     Conjunctiva/sclera: Conjunctivae normal.     Pupils: Pupils are equal, round, and reactive to light.  Cardiovascular:     Rate and Rhythm: Normal rate and regular rhythm.  Pulmonary:     Effort: Pulmonary effort is normal. No respiratory distress.     Breath sounds: No stridor. No wheezing, rhonchi or rales.  Chest:     Chest wall: No tenderness.  Abdominal:     Palpations: Abdomen is soft.     Tenderness: There is no abdominal tenderness. There is no right CVA tenderness, left CVA tenderness, guarding or rebound.  Musculoskeletal:        General: No tenderness.     Cervical back: Normal range of motion and neck supple. No tenderness.     Right lower leg: No edema.     Left lower leg: No edema.   Skin:    General: Skin is warm.     Capillary Refill: Capillary refill takes less than 2 seconds.     Coloration: Skin is not pale.     Findings: No erythema or rash.  Neurological:     Mental Status: He is alert and oriented to person, place, and time.     Cranial Nerves: No cranial nerve deficit.     Motor: No abnormal muscle tone.     Coordination: Coordination normal.     Deep Tendon Reflexes: Reflexes normal.  Psychiatric:        Mood and Affect: Mood normal.     ED Results / Procedures / Treatments   Labs (all  labs ordered are listed, but only abnormal results are displayed) Labs Reviewed  CBC WITH DIFFERENTIAL/PLATELET - Abnormal; Notable for the following components:      Result Value   WBC 18.5 (*)    RBC 6.20 (*)    Hemoglobin 18.5 (*)    HCT 56.2 (*)    Neutro Abs 16.0 (*)    Monocytes Absolute 1.2 (*)    Abs Immature Granulocytes 0.10 (*)    All other components within normal limits  COMPREHENSIVE METABOLIC PANEL - Abnormal; Notable for the following components:   Glucose, Bld 114 (*)    Creatinine, Ser 1.33 (*)    All other components within normal limits  CBG MONITORING, ED - Abnormal; Notable for the following components:   Glucose-Capillary 153 (*)    All other components within normal limits  RESP PANEL BY RT-PCR (FLU A&B, COVID) ARPGX2  URINE CULTURE  LIPASE, BLOOD  MAGNESIUM  URINALYSIS, ROUTINE W REFLEX MICROSCOPIC  BRAIN NATRIURETIC PEPTIDE  TROPONIN I (HIGH SENSITIVITY)  TROPONIN I (HIGH SENSITIVITY)    EKG EKG Interpretation  Date/Time:  Thursday January 05 2021 09:16:41 EST Ventricular Rate:  111 PR Interval:  148 QRS Duration: 106 QT Interval:  354 QTC Calculation: 481 R Axis:   81 Text Interpretation: Sinus tachycardia with Premature atrial complexes Incomplete right bundle branch block Inferior infarct , age undetermined ST & T wave abnormality, consider anterior ischemia Abnormal ECG When comapred to prior, similar apperance. No  STEMI Confirmed by Antony Blackbird [phone]) on 01/05/2021 9:19:49 AM   Radiology DG Chest Portable 1 View  Result Date: 01/05/2021 CLINICAL DATA:  60 year old male with near syncope. Dizziness. Smoker. EXAM: PORTABLE CHEST 1 VIEW COMPARISON:  Chest radiographs 05/30/2018 and earlier. FINDINGS: Portable AP upright view at 1023 hours. Lung volumes and mediastinal contours not significantly changed since 2019. Cardiac size within normal limits. Visualized tracheal air column is within normal limits. Mild chronic interstitial markings appear stable. Otherwise when allowing for portable technique the lungs are clear. No pneumothorax. No acute osseous abnormality identified. IMPRESSION: No acute cardiopulmonary abnormality. Chronic pulmonary interstitial changes. Electronically Signed   By: Genevie Ann M.D.   On: 01/05/2021 10:47    Procedures Procedures   Medications Ordered in ED Medications  sodium chloride 0.9 % bolus 500 mL (0 mLs Intravenous Stopped 01/05/21 1215)    ED Course  I have reviewed the triage vital signs and the nursing notes.  Pertinent labs & imaging results that were available during my care of the patient were reviewed by me and considered in my medical decision making (see chart for details).    MDM Rules/Calculators/A&P                          Chris [name] is a 60 y.o. male with a past medical history significant for CAD with prior MI, prostate cancer, hypertension, hyperlipidemia, and GERD who presents with fatigue, near syncope, lightheadedness, nausea, diarrhea, and ill appearance.  According to family, this is "the exact same" as last time patient looked when he had an MI.  He says that he has had shortness of breath chronically that does not seem to be worsening.  He does say that he is not having any chest pain or palpitations but felt like he was going to pass out and was very lightheaded.  He reports he got red all over.  He says that he has had nausea was going to  vomit but  stopped it.  He said he has had several episodes of loose stool today near when he was having a near syncopal episode.  He reports his urine has been darker.  He says his blood pressures been elevated over the last few weeks and he is scheduled to see his cardiologist, Dr. Gwenlyn Found, very soon.  Family called EMS and cardiology today who told him to come to the emergency department given similar symptoms to prior MI.  On arrival, EKG does not show STEMI.  On exam, lungs are clear and chest is nontender.  Abdomen is nontender.  Bowel sounds are appreciated.  Patient moving all extremities.  Normal sensation and strength in extremities.  Good pulses in lower extremities.  Legs have mild edema which she reports did not seem to be any different.  Patient does have dry mucous membranes on exam.  Clinically suspect patient has a viral gastroenteritis causing nausea, diarrhea, leading to dehydration and near syncope however, as the patient reports this is the same as he felt and appeared with prior MI, we will get a work-up started including cardiac enzymes, chest x-ray, and labs.  We will give him some fluids.  Patient reports his nausea is slightly improved right now and he does not need nausea medicine.  With his urine changes, will check urinalysis.  We will give him some fluids initially.  With his report of exertional shortness of breath that has been persistent for the last few weeks, will get a BNP.  Anticipate touching base with cardiology after work-up is complete.  Anticipate reassessment to determine disposition.  Patient reports feeling better on reassessment.  His urinalysis does not show infection.  His CBC does show elevated hemoglobin and white blood cell count.  Suspect a component of hemoconcentration with the nausea and loose stools.  I still suspect his symptoms are related to a viral gastroenteritis given his symptoms.  His chest x-ray was reassuring and his troponin was negative x2.   BNP not elevated.  Urine culture was sent.  Chest x-ray reassuring.  Had a shared decision-making conversation with patient and family.  We offered touching base with cardiology however the family reports they have been in touch with cardiology and will follow as an outpatient.  Patient says he would like to go home and will get a prescription for some nausea medicine.  He understand strict return precautions and follow-up instructions.  He had no other questions or concerns and was discharged after p.o. challenge.   Final Clinical Impression(s) / ED Diagnoses Final diagnoses:  Nausea  Diarrhea, unspecified type  Lightheaded    Rx / DC Orders ED Discharge Orders         Ordered    ondansetron (ZOFRAN) 4 MG tablet  Every 8 hours PRN        01/05/21 1440          Clinical Impression: 1. Nausea   2. Diarrhea, unspecified type   3. Lightheaded     Disposition: Discharge  Condition: Good  I have discussed the results, Dx and Tx plan with the pt(& family if present). He/she/they expressed understanding and agree(s) with the plan. Discharge instructions discussed at great length. Strict return precautions discussed and pt &/or family have verbalized understanding of the instructions. No further questions at time of discharge.    New Prescriptions   ONDANSETRON (ZOFRAN) 4 MG TABLET    Take 1 tablet (4 mg total) by mouth every 8 (eight) hours as needed.  Follow Up: Casilda Carls, [address] [place] [address] [phone]     your cardiology team        Jendayi Berling, Gwenyth Allegra, MD 01/05/21 [phone]

## 2021-01-05 NOTE — Discharge Instructions (Signed)
Your work-up today was overall reassuring.  As we discussed, both of your heart enzymes were negative both times we checked them.  Your kidney function was slightly elevated likely related to some dehydration from the nausea and loose stools.  I suspect you have a viral infection causing these symptoms.  You were watched for over 5 and half hours and had improvement in your symptoms and vital signs.  We had a shared decision-making conversation together and you feel comfortable calling your cardiology team as an outpatient and following up as opposed to speaking with him here in the emergency department today.  Please rest and stay hydrated and use the nausea medicine to help.  If any symptoms change or worsen, please return to the nearest emergency department immediately.

## 2021-01-05 NOTE — ED Notes (Signed)
Pt discharged home per MD order. Discharge summary reviewed with pt, pt verbalizes understanding. Pt voicing no complaints at discharge. Pt discharged home with wife.

## 2021-01-05 NOTE — ED Triage Notes (Signed)
Pt to ED via POV c/o diarrhea that started this morning. Pt had a near syncopal episode /dizzines also around 0700, pt did not fall, no LOC, No injury. Also reports nausea. Family called 37 with concern that symptoms were similar to those when he had a heart attack in 2008. Pt currently denies chest pain, pt currently voicing no complaints. Pt wife also reports ongoing problems over the past 3 weeks with patient blood pressure. Reports they have been in contact with cardiology and have an apt next Friday. Marland Kitchen

## 2021-01-06 LAB — URINE CULTURE: Culture: NO GROWTH

## 2021-01-13 ENCOUNTER — Ambulatory Visit: Payer: BC Managed Care – PPO | Admitting: Cardiovascular Disease

## 2021-01-13 ENCOUNTER — Other Ambulatory Visit: Payer: Self-pay

## 2021-01-13 ENCOUNTER — Encounter: Payer: Self-pay | Admitting: Cardiovascular Disease

## 2021-01-13 DIAGNOSIS — E782 Mixed hyperlipidemia: Secondary | ICD-10-CM | POA: Diagnosis not present

## 2021-01-13 DIAGNOSIS — Z720 Tobacco use: Secondary | ICD-10-CM | POA: Diagnosis not present

## 2021-01-13 DIAGNOSIS — I2510 Atherosclerotic heart disease of native coronary artery without angina pectoris: Secondary | ICD-10-CM | POA: Diagnosis not present

## 2021-01-13 NOTE — Assessment & Plan Note (Signed)
History of ongoing tobacco abuse of 1/2 pack/day recalcitrant to risk factor modification.

## 2021-01-13 NOTE — Patient Instructions (Signed)
Medication Instructions:  Your physician recommends that you continue on your current medications as directed. Please refer to the Current Medication list given to you today.  *If you need a refill on your cardiac medications before your next appointment, please call your pharmacy*   Lab Work: Your physician recommends that you return for lab work in: next 1-2 weeks for lipid/liver profile.  If you have labs (blood work) drawn today and your tests are completely normal, you will receive your results only by: Marland Kitchen MyChart Message (if you have MyChart) OR . A paper copy in the mail If you have any lab test that is abnormal or we need to change your treatment, we will call you to review the results.   Follow-Up: At [facility], you and your health needs are our priority.  As part of our continuing mission to provide you with exceptional heart care, we have created designated Provider Care Teams.  These Care Teams include your primary Cardiologist (physician) and Advanced Practice Providers (APPs -  Physician Assistants and Nurse Practitioners) who all work together to provide you with the care you need, when you need it.  We recommend signing up for the patient portal called "MyChart".  Sign up information is provided on this After Visit Summary.  MyChart is used to connect with patients for Virtual Visits (Telemedicine).  Patients are able to view lab/test results, encounter notes, upcoming appointments, etc.  Non-urgent messages can be sent to your provider as well.   To learn more about what you can do with MyChart, go to [email or web address].    Your next appointment:   12 month(s)  The format for your next appointment:   In Person  Provider:   Quay Burow, MD

## 2021-01-13 NOTE — Assessment & Plan Note (Signed)
History of hyperlipidemia on statin therapy.  We will recheck a lipid liver profile 

## 2021-01-13 NOTE — Assessment & Plan Note (Signed)
History of CAD status post inferior STEMI 04/17/2006 at midnight.  I cathed him femoral and placed 2 overlapping Cypher drug-eluting stents.  I also placed a temporary transvenous pacemaker at that time.  His EF was 40 to 45% with moderate inferior hypokinesia.  His ejection fraction ultimately improved.  He remains on clopidogrel.  He denies symptoms.

## 2021-01-13 NOTE — Progress Notes (Signed)
01/13/2021 Chris [name]   1961-03-12  [id number]  Primary Physician Casilda Carls, MD Primary Cardiologist: Lorretta Harp MD FACP, [place], [place], [place]  HPI:  Chris [name] is a 60 y.o.   mildly overweight married Caucasian male, father of 1 child, whom I saw the office 04/08/2018.Chris [name] He has a history of CAD, status post acute inferior wall myocardial infarction with RV infarct physiology on April 17, 2006. I took him to the cath lab at midnight that day and opened up an occluded RCA with overlapping Cypher drug-eluting stents. Circumflex and LAD were free of significant disease. His EF was 40% to 45% with moderate inferior hypokinesia. Subsequent echocardiogram revealed normal LV function with borderline inferior hypokinesia, and a Myoview performed  was not ischemic. His other problems include hyperlipidemia and continued tobacco abuse of  1/2 pack/day recalcitrant to risk factor modification..  Since I saw him a year and a half ago he was recently seen in the ER for tachycardia probably related to dehydration.  He said no recurrent chest pain.  He remains on clopidogrel.  He continues to smoke.  Current Meds  Medication Sig  . atorvastatin (LIPITOR) 20 MG tablet Take 1 tablet (20 mg total) by mouth daily.  . clopidogrel (PLAVIX) 75 MG tablet TAKE 1 TABLET (75 MG TOTAL) BY MOUTH DAILY. <PLEASE MAKE APPOINTMENT FOR REFILLS>  . metoprolol tartrate (LOPRESSOR) 25 MG tablet Take 1 tablet (25 mg total) by mouth daily. Need appointment before further refills  . pantoprazole (PROTONIX) 40 MG tablet Take 40 mg by mouth daily.  . tamsulosin (FLOMAX) 0.4 MG CAPS capsule Take 1 capsule (0.4 mg total) by mouth daily.  . [DISCONTINUED] ondansetron (ZOFRAN) 4 MG tablet Take 1 tablet (4 mg total) by mouth every 8 (eight) hours as needed.     No Known Allergies  Social History   Socioeconomic History  . Marital status: Married    Spouse name: Not on file  . Number of children: Not on file  .  Years of education: Not on file  . Highest education level: Not on file  Occupational History  . Not on file  Tobacco Use  . Smoking status: Current Some Day Smoker    Packs/day: 2.00    Years: 42.00    Pack years: 84.00    Types: Cigarettes  . Smokeless tobacco: Never Used  Vaping Use  . Vaping Use: Never used  Substance and Sexual Activity  . Alcohol use: No  . Drug use: No  . Sexual activity: Not Currently  Other Topics Concern  . Not on file  Social History Narrative   10-04 19 Unable to ask abuse questions wife with him today.   Social Determinants of Health   Financial Resource Strain: Not on file  Food Insecurity: Not on file  Transportation Needs: Not on file  Physical Activity: Not on file  Stress: Not on file  Social Connections: Not on file  Intimate Partner Violence: Not on file     Review of Systems: General: negative for chills, fever, night sweats or weight changes.  Cardiovascular: negative for chest pain, dyspnea on exertion, edema, orthopnea, palpitations, paroxysmal nocturnal dyspnea or shortness of breath Dermatological: negative for rash Respiratory: negative for cough or wheezing Urologic: negative for hematuria Abdominal: negative for nausea, vomiting, diarrhea, bright red blood per rectum, melena, or hematemesis Neurologic: negative for visual changes, syncope, or dizziness All other systems reviewed and are otherwise negative except as noted above.  Blood pressure 122/70, pulse 87, height 5\' 11"  (1.803 m), weight 250 lb 6.4 oz (113.6 kg), SpO2 96 %.  General appearance: alert and no distress Neck: no adenopathy, no carotid bruit, no JVD, supple, symmetrical, trachea midline and thyroid not enlarged, symmetric, no tenderness/mass/nodules Lungs: clear to auscultation bilaterally Heart: regular rate and rhythm, S1, S2 normal, no murmur, click, rub or gallop Extremities: extremities normal, atraumatic, no cyanosis or edema Pulses: 2+ and  symmetric Skin: Skin color, texture, turgor normal. No rashes or lesions Neurologic: Alert and oriented X 3, normal strength and tone. Normal symmetric reflexes. Normal coordination and gait  EKG sinus rhythm at 87 with incomplete right bundle branch block.  I personally reviewed this EKG.  ASSESSMENT AND PLAN:   Coronary artery disease History of CAD status post inferior STEMI 04/17/2006 at midnight.  I cathed him femoral and placed 2 overlapping Cypher drug-eluting stents.  I also placed a temporary transvenous pacemaker at that time.  His EF was 40 to 45% with moderate inferior hypokinesia.  His ejection fraction ultimately improved.  He remains on clopidogrel.  He denies symptoms.  Hyperlipidemia History of hyperlipidemia on statin therapy.  We will recheck a lipid liver profile.  Tobacco abuse History of ongoing tobacco abuse of 1/2 pack/day recalcitrant to risk factor modification.      Lorretta Harp MD [place], [facility] 01/13/2021 4:04 PM

## 2021-01-15 ENCOUNTER — Other Ambulatory Visit: Payer: Self-pay

## 2021-01-16 MED ORDER — CLOPIDOGREL BISULFATE 75 MG PO TABS: 75.0000 mg | ORAL_TABLET | Freq: Every day | ORAL | 3 refills | Status: DC

## 2021-01-16 MED ORDER — METOPROLOL TARTRATE 25 MG PO TABS: 25.0000 mg | ORAL_TABLET | Freq: Every day | ORAL | 3 refills | Status: DC

## 2021-01-16 NOTE — Telephone Encounter (Signed)
Rx(s) sent to pharmacy electronically.  

## 2021-01-17 ENCOUNTER — Other Ambulatory Visit: Payer: Self-pay | Admitting: Cardiovascular Disease

## 2021-02-01 ENCOUNTER — Ambulatory Visit: Payer: 59 | Admitting: Cardiovascular Disease

## 2021-02-16 DIAGNOSIS — H6501 Acute serous otitis media, right ear: Secondary | ICD-10-CM | POA: Diagnosis not present

## 2021-02-16 DIAGNOSIS — H6981 Other specified disorders of Eustachian tube, right ear: Secondary | ICD-10-CM | POA: Diagnosis not present

## 2021-02-16 DIAGNOSIS — J342 Deviated nasal septum: Secondary | ICD-10-CM | POA: Diagnosis not present

## 2021-04-05 ENCOUNTER — Ambulatory Visit: Payer: BC Managed Care – PPO | Admitting: Family Medicine

## 2021-05-22 MED ORDER — ATORVASTATIN CALCIUM 20 MG PO TABS: 20.0000 mg | ORAL_TABLET | Freq: Every day | ORAL | 3 refills | Status: DC

## 2021-07-21 DIAGNOSIS — Z08 Encounter for follow-up examination after completed treatment for malignant neoplasm: Secondary | ICD-10-CM | POA: Diagnosis not present

## 2021-07-21 DIAGNOSIS — L7211 Pilar cyst: Secondary | ICD-10-CM | POA: Diagnosis not present

## 2021-07-21 DIAGNOSIS — Z85828 Personal history of other malignant neoplasm of skin: Secondary | ICD-10-CM | POA: Diagnosis not present

## 2021-08-03 ENCOUNTER — Ambulatory Visit: Payer: Self-pay | Admitting: Surgery

## 2021-08-03 ENCOUNTER — Telehealth: Payer: Self-pay

## 2021-08-03 DIAGNOSIS — L729 Follicular cyst of the skin and subcutaneous tissue, unspecified: Secondary | ICD-10-CM | POA: Diagnosis not present

## 2021-08-03 NOTE — H&P (Signed)
Chris [name] [id number]    Referring Provider:  Register, Janice Norrie*     Subjective    Chief Complaint: Scalp lesion     History of Present Illness:    Very pleasant 60 year old man who presents with a lesion on his posterior scalp.  This has been present for at least the last 6 months and has been slowly increasing in size.  His dermatologist has lanced it a couple times without much result.  Not painful per se and no active drainage but he does note significant itching in the area.   Review of Systems: A complete review of systems was obtained from the patient.  I have reviewed this information and discussed as appropriate with the patient.  See HPI as well for other ROS.     Medical History: Past Medical History      Past Medical History:  Diagnosis Date   CHF (congestive heart failure) (CMS-HCC)     GERD (gastroesophageal reflux disease)     History of cancer          There is no problem list on file for this patient.     Past Surgical History  History reviewed. No pertinent surgical history.      Allergies  No Known Allergies           Current Outpatient Medications on File Prior to Visit  Medication Sig Dispense Refill   aspirin 81 MG EC tablet Take 81 mg by mouth once daily.       atorvastatin (LIPITOR) 10 MG tablet Take 10 mg by mouth once daily.       clopidogrel (PLAVIX) 75 mg tablet Take 75 mg by mouth once daily. 1 in am       metoprolol tartrate (LOPRESSOR) 25 MG tablet Take 25 mg by mouth once daily. In am       omeprazole (PRILOSEC) 10 MG DR capsule Take 10 mg by mouth once daily.       traMADol (ULTRAM) 50 mg tablet Take 1 tablet (50 mg total) by mouth every 6 (six) hours as needed for Pain. 30 tablet 0    No current facility-administered medications on file prior to visit.      Family History       Family History  Problem Relation Age of Onset   High blood pressure (Hypertension) Father     Hyperlipidemia (Elevated cholesterol) Father      Deep vein thrombosis (DVT or abnormal blood clot formation) Father     Coronary Artery Disease (Blocked arteries around heart) Father          Social History        Tobacco Use  Smoking Status Current Every Day Smoker   Packs/day: 2.00   Years: 35.00   Pack years: 70.00   Types: Cigarettes  Smokeless Tobacco Never Used      Social History  Social History         Socioeconomic History   Marital status: Married  Tobacco Use   Smoking status: Current Every Day Smoker      Packs/day: 2.00      Years: 35.00      Pack years: 70.00      Types: Cigarettes   Smokeless tobacco: Never Used  Scientific laboratory technician Use: Never used  Substance and Sexual Activity   Alcohol use: No   Drug use: No        Objective:         Vitals:  08/03/21 1027  Pulse: 61  Temp: 37 C (98.6 F)  SpO2: 99%  Weight: (!) 110 kg (242 lb 6.4 oz)  Height: 182.9 cm (6')    Body mass index is 32.88 kg/m.   Alert, well-appearing Unlabored respirations 1.5 transverse by 1 cm vertical mobile subcutaneous lesion on the posterior scalp   Assessment and Plan:  Diagnoses and all orders for this visit:   Scalp cyst -     CCS Case Posting Request; Future     Will proceed with excision under MAC.  Discussed the surgical technique and risks of bleeding, infection, pain, scarring, cyst recurrence, wound healing problems etc.  Questions welcomed and answered.  Ideally would hold Plavix preop but if this is not acceptable we can probably proceed on the Plavix, will request clearance from his cardiologist.   Bobbe Medico, MD

## 2021-08-03 NOTE — H&P (View-Only) (Signed)
Chris [name] [id number]    Referring Provider:  Register, Janice Norrie*     Subjective    Chief Complaint: Scalp lesion     History of Present Illness:    Very pleasant 60 year old man who presents with a lesion on his posterior scalp.  This has been present for at least the last 6 months and has been slowly increasing in size.  His dermatologist has lanced it a couple times without much result.  Not painful per se and no active drainage but he does note significant itching in the area.   Review of Systems: A complete review of systems was obtained from the patient.  I have reviewed this information and discussed as appropriate with the patient.  See HPI as well for other ROS.     Medical History: Past Medical History      Past Medical History:  Diagnosis Date   CHF (congestive heart failure) (CMS-HCC)     GERD (gastroesophageal reflux disease)     History of cancer          There is no problem list on file for this patient.     Past Surgical History  History reviewed. No pertinent surgical history.      Allergies  No Known Allergies           Current Outpatient Medications on File Prior to Visit  Medication Sig Dispense Refill   aspirin 81 MG EC tablet Take 81 mg by mouth once daily.       atorvastatin (LIPITOR) 10 MG tablet Take 10 mg by mouth once daily.       clopidogrel (PLAVIX) 75 mg tablet Take 75 mg by mouth once daily. 1 in am       metoprolol tartrate (LOPRESSOR) 25 MG tablet Take 25 mg by mouth once daily. In am       omeprazole (PRILOSEC) 10 MG DR capsule Take 10 mg by mouth once daily.       traMADol (ULTRAM) 50 mg tablet Take 1 tablet (50 mg total) by mouth every 6 (six) hours as needed for Pain. 30 tablet 0    No current facility-administered medications on file prior to visit.      Family History       Family History  Problem Relation Age of Onset   High blood pressure (Hypertension) Father     Hyperlipidemia (Elevated cholesterol) Father      Deep vein thrombosis (DVT or abnormal blood clot formation) Father     Coronary Artery Disease (Blocked arteries around heart) Father          Social History        Tobacco Use  Smoking Status Current Every Day Smoker   Packs/day: 2.00   Years: 35.00   Pack years: 70.00   Types: Cigarettes  Smokeless Tobacco Never Used      Social History  Social History         Socioeconomic History   Marital status: Married  Tobacco Use   Smoking status: Current Every Day Smoker      Packs/day: 2.00      Years: 35.00      Pack years: 70.00      Types: Cigarettes   Smokeless tobacco: Never Used  Scientific laboratory technician Use: Never used  Substance and Sexual Activity   Alcohol use: No   Drug use: No        Objective:         Vitals:  08/03/21 1027  Pulse: 61  Temp: 37 C (98.6 F)  SpO2: 99%  Weight: (!) 110 kg (242 lb 6.4 oz)  Height: 182.9 cm (6')    Body mass index is 32.88 kg/m.   Alert, well-appearing Unlabored respirations 1.5 transverse by 1 cm vertical mobile subcutaneous lesion on the posterior scalp   Assessment and Plan:  Diagnoses and all orders for this visit:   Scalp cyst -     CCS Case Posting Request; Future     Will proceed with excision under MAC.  Discussed the surgical technique and risks of bleeding, infection, pain, scarring, cyst recurrence, wound healing problems etc.  Questions welcomed and answered.  Ideally would hold Plavix preop but if this is not acceptable we can probably proceed on the Plavix, will request clearance from his cardiologist.   Bobbe Medico, MD

## 2021-08-03 NOTE — Telephone Encounter (Signed)
   [facility] Pre-operative Risk Assessment    Patient Name: Chris [name]  DOB: 05/13/1961 MRN: [id number]  HEARTCARE STAFF:  - IMPORTANT!!!!!! Under Visit Info/Reason for Call, type in Other and utilize the format Clearance MM/DD/YY or Clearance TBD. Do not use dashes or single digits. - Please review there is not already an duplicate clearance open for this procedure. - If request is for dental extraction, please clarify the # of teeth to be extracted. - If the patient is currently at the dentist's office, call Pre-Op Callback Staff (MA/nurse) to input urgent request.  - If the patient is not currently in the dentist office, please route to the Pre-Op pool.  Request for surgical clearance:  What type of surgery is being performed? Excision of Posterior Cyst  When is this surgery scheduled? TBD  What type of clearance is required (medical clearance vs. Pharmacy clearance to hold med vs. Both)? Both   Are there any medications that need to be held prior to surgery and how long? Plavix  Practice name and name of physician performing surgery? [facility], Dr. Jens Som  What is the office phone number? [phone]   7.   What is the office fax number? [phone]  8.   Anesthesia type (None, local, MAC, general) ? General    Jacqulynn Cadet 08/03/2021, 3:25 PM  _________________________________________________________________   (provider comments below)

## 2021-08-03 NOTE — Telephone Encounter (Signed)
   Name: Chris [name]  DOB: 12-10-1960  MRN: [id number]   Primary Cardiologist: Quay Burow, MD  Chart reviewed as part of pre-operative protocol coverage. Patient was contacted 08/03/2021 in reference to pre-operative risk assessment for pending surgery as outlined below.  Chris [name] was last seen on 12/2020 by Dr. Gwenlyn Found, history reviewed. From cardiac standpoint, has h/o CAD with inf MI/RV infarct 2007 s/p DES to RCA, EF 40-45%. Nuc 2012 showed no ischemia, EF 56%. Revised cardiac risk index is 6.6% indicating moderate CV risk based on medical history. I reached out to patient for update on how he is doing. The patient affirms he has been doing well without any new cardiac symptoms. He clarifies surgery is for scalp cyst. Continues to remain physically active without any angina or dyspnea. Therefore, based on ACC/AHA guidelines, the patient would be at acceptable risk for the planned procedure without further cardiovascular testing. The patient was advised that if he develops new symptoms prior to surgery to contact our office to arrange for a follow-up visit, and he verbalized understanding.  During unrelated clearance in 2019 Dr. Gwenlyn Found had granted clearance to hold Plavix for unrelated procedure. Since the patinet has not had any pertinent interim cardiac developments that would prohibit holding Plavix, this clearance still stands (we generally recommend holding 5-7 days but will defer to surgeon on length of hold). We typically advise that blood thinners be resumed when felt safe by performing physician.  Will route this bundled recommendation to requesting provider via Epic fax function. Please call with questions.   [name], PA-C 08/03/2021, 4:22 PM

## 2021-08-16 NOTE — Telephone Encounter (Signed)
Raven from [facility] calling to follow up on clearance. She says the fax number is: [phone]

## 2021-08-24 NOTE — Patient Instructions (Signed)
DUE TO COVID-19 ONLY ONE VISITOR IS ALLOWED TO COME WITH YOU AND STAY IN THE WAITING ROOM ONLY DURING PRE OP AND PROCEDURE.   **NO VISITORS ARE ALLOWED IN THE SHORT STAY AREA OR RECOVERY ROOM!!**   Your procedure is scheduled on: Wednesday, 08-30-21   Report to [facility] Main  Entrance     Report to admitting at 9:00 AM   Call this number if you have problems the morning of surgery [phone]   Do not eat food :After Midnight.   May have liquids until 8:15 AM day of surgery  CLEAR LIQUID DIET  Foods Allowed                                                                     Foods Excluded  Water, Black Coffee (no milk/no creamer) and tea, regular and decaf                              liquids that you cannot  Plain Jell-O in any flavor  (No red)                         see through such as: Fruit ices (not with fruit pulp)                                 milk, soups, orange juice  Iced Popsicles (No red)                                    All solid food                             Apple juices Sports drinks like Gatorade (No red) Lightly seasoned clear broth or consume(fat free) Sugar    Oral Hygiene is also important to reduce your risk of infection.                                    Remember - BRUSH YOUR TEETH THE MORNING OF SURGERY WITH YOUR REGULAR TOOTHPASTE   Do NOT smoke after Midnight  Take these medicines the morning of surgery with A SIP OF WATER:  Atorvastatin, Metoprolol, Omeprazole, Tamsulosin                    Plavix - hold a week prior to surgery   Stop all vitamins and herbal supplements a week before surgery             You may not have any metal on your body including jewelry, and body piercing             Do not wear lotions, powders, cologne, or deodorant              Men may shave face and neck.  Do not bring valuables to the hospital. [place].   Contacts, dentures or bridgework  may not be worn  into surgery.    Patients discharged the day of surgery will not be allowed to drive home.  Special Instructions: Bring a copy of your healthcare power of attorney and living will documents the day of surgery if you haven't scanned them in before.  Please read over the following fact sheets you were given: IF YOU HAVE QUESTIONS ABOUT YOUR PRE OP INSTRUCTIONS PLEASE CALL [phone]   [place] - Preparing for Surgery Before surgery, you can play an important role.  Because skin is not sterile, your skin needs to be as free of germs as possible.  You can reduce the number of germs on your skin by washing with CHG (chlorahexidine gluconate) soap before surgery.  CHG is an antiseptic cleaner which kills germs and bonds with the skin to continue killing germs even after washing. Please DO NOT use if you have an allergy to CHG or antibacterial soaps.  If your skin becomes reddened/irritated stop using the CHG and inform your nurse when you arrive at Short Stay. Do not shave (including legs and underarms) for at least 48 hours prior to the first CHG shower.  You may shave your face/neck.  Please follow these instructions carefully:  1.  Shower with CHG Soap the night before surgery and the  morning of surgery.  2.  If you choose to wash your hair, wash your hair first as usual with your normal  shampoo.  3.  After you shampoo, rinse your hair and body thoroughly to remove the shampoo.                             4.  Use CHG as you would any other liquid soap.  You can apply chg directly to the skin and wash.  Gently with a scrungie or clean washcloth.  5.  Apply the CHG Soap to your body ONLY FROM THE NECK DOWN.   Do   not use on face/ open                           Wound or open sores. Avoid contact with eyes, ears mouth and   genitals (private parts).                       Wash face,  Genitals (private parts) with your normal soap.             6.  Wash thoroughly, paying special attention to the  area where your    surgery  will be performed.  7.  Thoroughly rinse your body with warm water from the neck down.  8.  DO NOT shower/wash with your normal soap after using and rinsing off the CHG Soap.                9.  Pat yourself dry with a clean towel.            10.  Wear clean pajamas.            11.  Place clean sheets on your bed the night of your first shower and do not  sleep with pets. Day of Surgery : Do not apply any lotions/deodorants the morning of surgery.  Please wear clean clothes to the hospital/surgery center.  FAILURE TO FOLLOW THESE INSTRUCTIONS MAY RESULT IN THE CANCELLATION OF YOUR SURGERY  PATIENT SIGNATURE_________________________________  NURSE  SIGNATURE__________________________________  ________________________________________________________________________

## 2021-08-28 ENCOUNTER — Encounter (HOSPITAL_COMMUNITY)
Admission: RE | Admit: 2021-08-28 | Discharge: 2021-08-28 | Disposition: A | Discharge status: Home / Self Care | Payer: BC Managed Care – PPO | Source: Ambulatory Visit | Attending: Surgery | Admitting: Surgery

## 2021-08-28 ENCOUNTER — Other Ambulatory Visit: Payer: Self-pay

## 2021-08-28 ENCOUNTER — Encounter (HOSPITAL_COMMUNITY): Payer: Self-pay

## 2021-08-28 VITALS — BP 175/109 | HR 57 | Temp 98.1°F | Resp 16 | Ht 71.0 in | Wt 241.0 lb

## 2021-08-28 DIAGNOSIS — Z79899 Other long term (current) drug therapy: Secondary | ICD-10-CM | POA: Insufficient documentation

## 2021-08-28 DIAGNOSIS — I252 Old myocardial infarction: Secondary | ICD-10-CM | POA: Insufficient documentation

## 2021-08-28 DIAGNOSIS — Z955 Presence of coronary angioplasty implant and graft: Secondary | ICD-10-CM | POA: Insufficient documentation

## 2021-08-28 DIAGNOSIS — D234 Other benign neoplasm of skin of scalp and neck: Secondary | ICD-10-CM | POA: Insufficient documentation

## 2021-08-28 DIAGNOSIS — F17200 Nicotine dependence, unspecified, uncomplicated: Secondary | ICD-10-CM | POA: Diagnosis not present

## 2021-08-28 DIAGNOSIS — K219 Gastro-esophageal reflux disease without esophagitis: Secondary | ICD-10-CM | POA: Insufficient documentation

## 2021-08-28 DIAGNOSIS — Z01818 Encounter for other preprocedural examination: Secondary | ICD-10-CM

## 2021-08-28 DIAGNOSIS — F17210 Nicotine dependence, cigarettes, uncomplicated: Secondary | ICD-10-CM | POA: Insufficient documentation

## 2021-08-28 DIAGNOSIS — I2510 Atherosclerotic heart disease of native coronary artery without angina pectoris: Secondary | ICD-10-CM | POA: Insufficient documentation

## 2021-08-28 DIAGNOSIS — Z7902 Long term (current) use of antithrombotics/antiplatelets: Secondary | ICD-10-CM | POA: Insufficient documentation

## 2021-08-28 DIAGNOSIS — Z01812 Encounter for preprocedural laboratory examination: Secondary | ICD-10-CM | POA: Insufficient documentation

## 2021-08-28 DIAGNOSIS — C4442 Squamous cell carcinoma of skin of scalp and neck: Secondary | ICD-10-CM | POA: Diagnosis not present

## 2021-08-28 LAB — CBC
HCT: 49.3 % (ref 39.0–52.0)
Hemoglobin: 16.5 g/dL (ref 13.0–17.0)
MCH: 29.8 pg (ref 26.0–34.0)
MCHC: 33.5 g/dL (ref 30.0–36.0)
MCV: 89 fL (ref 80.0–100.0)
Platelets: 240 10*3/uL (ref 150–400)
RBC: 5.54 MIL/uL (ref 4.22–5.81)
RDW: 13.8 % (ref 11.5–15.5)
WBC: 7.7 10*3/uL (ref 4.0–10.5)
nRBC: 0 % (ref 0.0–0.2)

## 2021-08-28 LAB — BASIC METABOLIC PANEL
Anion gap: 7 (ref 5–15)
BUN: 15 mg/dL (ref 6–20)
CO2: 24 mmol/L (ref 22–32)
Calcium: 8.7 mg/dL — ABNORMAL LOW (ref 8.9–10.3)
Chloride: 106 mmol/L (ref 98–111)
Creatinine, Ser: 0.9 mg/dL (ref 0.61–1.24)
GFR, Estimated: >60 mL/min (ref >60)
Glucose, Bld: 110 mg/dL — ABNORMAL HIGH (ref 70–99)
Potassium: 3.9 mmol/L (ref 3.5–5.1)
Sodium: 137 mmol/L (ref 135–145)

## 2021-08-28 NOTE — Progress Notes (Addendum)
PCP - Casilda Carls, MD Cardiologist - Quay Burow, MD, Clearance Melina Copa PA 08-03-21  PPM/ICD -  Device Orders -  Rep Notified -   Chest x-ray - 01-05-21 EKG - 01-13-21 Stress Test -  ECHO -  Cardiac Cath -   Sleep Study -  CPAP -   Fasting Blood Sugar -  Checks Blood Sugar _____ times a day  Blood Thinner Instructions:plavix Hold 5 days  Aspirin Instructions:  ERAS Protcol - PRE-SURGERY Ensure or G2-   COVID TEST- N/A COVID vaccine -  Activity--Able to walk a flight of stairs without SOB Anesthesia review: CAD stents x2, MI 2008, BP aware at preop 174/104 Private chat sent to [facility]. Pt. Aware could be cancelled if BP elevated DOS. Has cardiac clearance  Patient denies shortness of breath, fever, cough and chest pain at PAT appointment   All instructions explained to the patient, with a verbal understanding of the material. Patient agrees to go over the instructions while at home for a better understanding. Patient also instructed to self quarantine after being tested for COVID-19. The opportunity to ask questions was provided.

## 2021-08-29 NOTE — Progress Notes (Signed)
Anesthesia Chart Review   Case: [id number] Date/Time: 08/30/21 1100   Procedure: EXCISION OF POSTERIOR SCALP CYST   Anesthesia type: Monitor Anesthesia Care   Pre-op diagnosis: SCALP CYST   Location: [facility] ROOM 01 / [facility]   Surgeons: Clovis Riley, MD       DISCUSSION:60 y.o. every day smoker with h/o GERD, CAD (stents x2 MI 2007), prostate cancer, scalp cyst scheduled for above procedure 08/30/21 with Dr. Romana Juniper.   Per cardiology preoperative evaluation 08/03/2021, "Chart reviewed as part of pre-operative protocol coverage. Patient was contacted 08/03/2021 in reference to pre-operative risk assessment for pending surgery as outlined below.  MATEUS REWERTS was last seen on 12/2020 by Dr. Gwenlyn Found, history reviewed. From cardiac standpoint, has h/o CAD with inf MI/RV infarct 2007 s/p DES to RCA, EF 40-45%. Nuc 2012 showed no ischemia, EF 56%. Revised cardiac risk index is 6.6% indicating moderate CV risk based on medical history. I reached out to patient for update on how he is doing. The patient affirms he has been doing well without any new cardiac symptoms. He clarifies surgery is for scalp cyst. Continues to remain physically active without any angina or dyspnea. Therefore, based on ACC/AHA guidelines, the patient would be at acceptable risk for the planned procedure without further cardiovascular testing. The patient was advised that if he develops new symptoms prior to surgery to contact our office to arrange for a follow-up visit, and he verbalized understanding.   During unrelated clearance in 2019 Dr. Gwenlyn Found had granted clearance to hold Plavix for unrelated procedure. Since the patinet has not had any pertinent interim cardiac developments that would prohibit holding Plavix, this clearance still stands (we generally recommend holding 5-7 days but will defer to surgeon on length of hold). We typically advise that blood thinners be resumed when felt safe by performing physician."  BP  Readings from Last 3 Encounters:  08/28/21 (!) 175/109  01/13/21 122/70  01/05/21 115/74   Elevated BP at PAT visit.  Pt was advised to contact PCP. Discussed risk of cancellation.  VS: BP (!) 175/109   Pulse (!) 57   Temp 36.7 C (Oral)   Resp 16   Ht 5\' 11"  (1.803 m)   Wt 109.3 kg   SpO2 97%   BMI 33.61 kg/m   PROVIDERS: Casilda Carls, MD is PCP   Quay Burow, MD is Cardiologist  LABS: Labs reviewed: Acceptable for surgery. (all labs ordered are listed, but only abnormal results are displayed)  Labs Reviewed  BASIC METABOLIC PANEL - Abnormal; Notable for the following components:      Result Value   Glucose, Bld 110 (*)    Calcium 8.7 (*)    All other components within normal limits  CBC     IMAGES:   EKG: 01/13/2021 Rate 87 bpm  Sinus rhythm with occasional premature ventricular complexes Incomplete RBBB Cannot rule out inferior infarct, age undetermined  CV: Stress Test 04/19/2011 Normal Myocardial Perfusion Study. This is a low risk scan.   Echo 06/14/2006 A complete two-dimensional transthoracic echocardiogram was performed. Left ventricular systolic function is low normal.  There is borderline concentric left ventricular hypertrophy There is borderline inferior wall hyokinesis There is trace mitral regurgitation There is mild tricuspid regurgitation.  Past Medical History:  Diagnosis Date   Complication of anesthesia    wife states very anxious may need pre sedation   Coronary artery disease    GERD (gastroesophageal reflux disease)    Hyperlipidemia  MI (myocardial infarction) ([place]) 04/17/2006   Acute inferolateral wall MI with cardiogenic shock and complete heart block   Prostate cancer ([place])    Tobacco abuse    Wears glasses    Wears partial dentures    Upper    Past Surgical History:  Procedure Laterality Date   CARDIAC CATHETERIZATION  2007   left, RCA 100% occluded ruptured plaque with thrombus in the proximal segment    CYSTOSCOPY N/A 07/17/2018   Procedure: CYSTOSCOPY;  Surgeon: Franchot Gallo, MD;  [facility];  Service: Urology;  Laterality: N/A;  no seeds in bladder per Dr Diona Fanti   PROSTATE BIOPSY     RADIOACTIVE SEED IMPLANT N/A 07/17/2018   Procedure: RADIOACTIVE SEED IMPLANT/BRACHYTHERAPY IMPLANT;  Surgeon: Franchot Gallo, MD;  Location: [facility];  Service: Urology;  Laterality: N/A;  77 seeds   SPACE OAR INSTILLATION N/A 07/17/2018   Procedure: SPACE OAR INSTILLATION;  Surgeon: Franchot Gallo, MD;  Location[facility];  Service: Urology;  Laterality: N/A;   WRIST SURGERY  10/04/2012    MEDICATIONS:  atorvastatin (LIPITOR) 20 MG tablet   clopidogrel (PLAVIX) 75 MG tablet   ketoconazole (NIZORAL) 2 % cream   metoprolol tartrate (LOPRESSOR) 25 MG tablet   omeprazole (PRILOSEC OTC) 20 MG tablet   tamsulosin (FLOMAX) 0.4 MG CAPS capsule   No current facility-administered medications for this encounter.     Konrad Felix Ward, PA-C [facility]-Surgical Testing [phone]

## 2021-08-29 NOTE — Anesthesia Preprocedure Evaluation (Addendum)
Anesthesia Evaluation  Patient identified by MRN, date of birth, ID band Patient awake    Reviewed: Allergy & Precautions, NPO status , Patient's Chart, lab work & pertinent test results  Airway Mallampati: II  TM Distance: >3 FB Neck ROM: Full    Dental no notable dental hx.    Pulmonary Current Smoker,    Pulmonary exam normal breath sounds clear to auscultation       Cardiovascular + CAD, + Past MI and + Cardiac Stents  Normal cardiovascular exam Rhythm:Regular Rate:Normal     Neuro/Psych negative neurological ROS  negative psych ROS   GI/Hepatic Neg liver ROS, GERD  Medicated,  Endo/Other  negative endocrine ROS  Renal/GU negative Renal ROS  negative genitourinary   Musculoskeletal negative musculoskeletal ROS (+)   Abdominal   Peds negative pediatric ROS (+)  Hematology negative hematology ROS (+)   Anesthesia Other Findings   Reproductive/Obstetrics negative OB ROS                            Anesthesia Physical Anesthesia Plan  ASA: 3  Anesthesia Plan: MAC   Post-op Pain Management:    Induction: Intravenous  PONV Risk Score and Plan: 1 and Propofol infusion and Treatment may vary due to age or medical condition  Airway Management Planned: Simple Face Mask  Additional Equipment:   Intra-op Plan:   Post-operative Plan:   Informed Consent: I have reviewed the patients History and Physical, chart, labs and discussed the procedure including the risks, benefits and alternatives for the proposed anesthesia with the patient or authorized representative who has indicated his/her understanding and acceptance.     Dental advisory given  Plan Discussed with: CRNA and Surgeon  Anesthesia Plan Comments: (See PAT note 08/28/2021, Konrad Felix Ward, PA-C)       Anesthesia Quick Evaluation

## 2021-08-30 ENCOUNTER — Ambulatory Visit (HOSPITAL_COMMUNITY)
Admission: RE | Admit: 2021-08-30 | Discharge: 2021-08-30 | Disposition: A | Discharge status: Home / Self Care | Payer: BC Managed Care – PPO | Attending: Surgery | Admitting: Surgery

## 2021-08-30 ENCOUNTER — Encounter (HOSPITAL_COMMUNITY)
Admission: RE | Disposition: A | Discharge status: Home / Self Care | Payer: Self-pay | Source: Home / Self Care | Attending: Surgery

## 2021-08-30 ENCOUNTER — Ambulatory Visit (HOSPITAL_COMMUNITY): Payer: BC Managed Care – PPO | Admitting: Physician Assistant

## 2021-08-30 ENCOUNTER — Encounter (HOSPITAL_COMMUNITY): Payer: Self-pay | Admitting: Surgery

## 2021-08-30 DIAGNOSIS — C4442 Squamous cell carcinoma of skin of scalp and neck: Secondary | ICD-10-CM | POA: Diagnosis not present

## 2021-08-30 DIAGNOSIS — R220 Localized swelling, mass and lump, head: Secondary | ICD-10-CM | POA: Diagnosis not present

## 2021-08-30 DIAGNOSIS — K219 Gastro-esophageal reflux disease without esophagitis: Secondary | ICD-10-CM | POA: Diagnosis not present

## 2021-08-30 DIAGNOSIS — I2510 Atherosclerotic heart disease of native coronary artery without angina pectoris: Secondary | ICD-10-CM | POA: Diagnosis not present

## 2021-08-30 DIAGNOSIS — F17200 Nicotine dependence, unspecified, uncomplicated: Secondary | ICD-10-CM | POA: Diagnosis not present

## 2021-08-30 DIAGNOSIS — E785 Hyperlipidemia, unspecified: Secondary | ICD-10-CM | POA: Diagnosis not present

## 2021-08-30 HISTORY — DX: Anxiety disorder, unspecified: F41.9

## 2021-08-30 HISTORY — PX: CYST EXCISION: SHX5701

## 2021-08-30 SURGERY — CYST REMOVAL
Anesthesia: Monitor Anesthesia Care

## 2021-08-30 MED ORDER — CHLORHEXIDINE GLUCONATE CLOTH 2 % EX PADS: 6.0000 | MEDICATED_PAD | Freq: Once | CUTANEOUS | Status: DC

## 2021-08-30 MED ORDER — ONDANSETRON HCL 4 MG/2ML IJ SOLN
INTRAMUSCULAR | Status: DC | PRN
  Administered 2021-08-30: 4 mg via INTRAVENOUS

## 2021-08-30 MED ORDER — LACTATED RINGERS IV SOLN: INTRAVENOUS | Status: DC

## 2021-08-30 MED ORDER — PROPOFOL 10 MG/ML IV BOLUS
INTRAVENOUS | Status: AC
  Filled 2021-08-30: qty 20

## 2021-08-30 MED ORDER — TRAMADOL HCL 50 MG PO TABS: 50.0000 mg | ORAL_TABLET | Freq: Four times a day (QID) | ORAL | 0 refills | Status: AC | PRN

## 2021-08-30 MED ORDER — MIDAZOLAM HCL 2 MG/2ML IJ SOLN
INTRAMUSCULAR | Status: AC
  Filled 2021-08-30: qty 2

## 2021-08-30 MED ORDER — FENTANYL CITRATE (PF) 100 MCG/2ML IJ SOLN
INTRAMUSCULAR | Status: AC
  Filled 2021-08-30: qty 2

## 2021-08-30 MED ORDER — FENTANYL CITRATE (PF) 100 MCG/2ML IJ SOLN
INTRAMUSCULAR | Status: DC | PRN
  Administered 2021-08-30: 50 ug via INTRAVENOUS

## 2021-08-30 MED ORDER — BUPIVACAINE-EPINEPHRINE 0.5% -1:200000 IJ SOLN
INTRAMUSCULAR | Status: AC
  Filled 2021-08-30: qty 1

## 2021-08-30 MED ORDER — OXYCODONE HCL 5 MG PO TABS: 5.0000 mg | ORAL_TABLET | Freq: Once | ORAL | Status: DC | PRN

## 2021-08-30 MED ORDER — MIDAZOLAM HCL 5 MG/5ML IJ SOLN
INTRAMUSCULAR | Status: DC | PRN
  Administered 2021-08-30: 2 mg via INTRAVENOUS

## 2021-08-30 MED ORDER — ACETAMINOPHEN 500 MG PO TABS
1000.0000 mg | ORAL_TABLET | ORAL | Status: AC
  Administered 2021-08-30: 1000 mg via ORAL
  Filled 2021-08-30: qty 2

## 2021-08-30 MED ORDER — DEXAMETHASONE SODIUM PHOSPHATE 10 MG/ML IJ SOLN
INTRAMUSCULAR | Status: AC
  Filled 2021-08-30: qty 1

## 2021-08-30 MED ORDER — CEFAZOLIN SODIUM-DEXTROSE 2-4 GM/100ML-% IV SOLN
2.0000 g | INTRAVENOUS | Status: AC
  Administered 2021-08-30: 2 g via INTRAVENOUS
  Filled 2021-08-30: qty 100

## 2021-08-30 MED ORDER — CHLORHEXIDINE GLUCONATE 0.12 % MT SOLN
15.0000 mL | Freq: Once | OROMUCOSAL | Status: AC
  Administered 2021-08-30: 15 mL via OROMUCOSAL

## 2021-08-30 MED ORDER — PROPOFOL 500 MG/50ML IV EMUL
INTRAVENOUS | Status: AC
  Filled 2021-08-30: qty 50

## 2021-08-30 MED ORDER — FENTANYL CITRATE PF 50 MCG/ML IJ SOSY: 25.0000 ug | PREFILLED_SYRINGE | INTRAMUSCULAR | Status: DC | PRN

## 2021-08-30 MED ORDER — ONDANSETRON HCL 4 MG/2ML IJ SOLN: 4.0000 mg | Freq: Once | INTRAMUSCULAR | Status: DC | PRN

## 2021-08-30 MED ORDER — ONDANSETRON HCL 4 MG/2ML IJ SOLN
INTRAMUSCULAR | Status: AC
  Filled 2021-08-30: qty 2

## 2021-08-30 MED ORDER — PROPOFOL 500 MG/50ML IV EMUL
INTRAVENOUS | Status: DC | PRN
  Administered 2021-08-30: 125 ug/kg/min via INTRAVENOUS

## 2021-08-30 MED ORDER — BUPIVACAINE-EPINEPHRINE (PF) 0.5% -1:200000 IJ SOLN
INTRAMUSCULAR | Status: DC | PRN
  Administered 2021-08-30: 5 mL via PERINEURAL

## 2021-08-30 MED ORDER — OXYCODONE HCL 5 MG/5ML PO SOLN: 5.0000 mg | Freq: Once | ORAL | Status: DC | PRN

## 2021-08-30 MED ORDER — PROPOFOL 10 MG/ML IV BOLUS
INTRAVENOUS | Status: DC | PRN
  Administered 2021-08-30: 20 mg via INTRAVENOUS

## 2021-08-30 MED ORDER — ORAL CARE MOUTH RINSE: 15.0000 mL | Freq: Once | OROMUCOSAL | Status: AC

## 2021-08-30 SURGICAL SUPPLY — 30 items
APL PRP STRL LF DISP 70% ISPRP (MISCELLANEOUS) ×1
BAG COUNTER SPONGE SURGICOUNT (BAG) IMPLANT
BAG SPNG CNTER NS LX DISP (BAG)
BLADE SURG SZ10 CARB STEEL (BLADE) ×2 IMPLANT
BNDG GAUZE ELAST 4 BULKY (GAUZE/BANDAGES/DRESSINGS) IMPLANT
CHLORAPREP W/TINT 26 (MISCELLANEOUS) ×2 IMPLANT
COVER SURGICAL LIGHT HANDLE (MISCELLANEOUS) ×2 IMPLANT
DECANTER SPIKE VIAL GLASS SM (MISCELLANEOUS) ×2 IMPLANT
DRAIN PENROSE 0.25X18 (DRAIN) IMPLANT
DRAPE LAPAROTOMY TRNSV 102X78 (DRAPES) ×2 IMPLANT
DRSG PAD ABDOMINAL 8X10 ST (GAUZE/BANDAGES/DRESSINGS) IMPLANT
ELECT REM PT RETURN 15FT ADLT (MISCELLANEOUS) ×2 IMPLANT
GAUZE 4X4 16PLY ~~LOC~~+RFID DBL (SPONGE) ×2 IMPLANT
GAUZE SPONGE 4X4 12PLY STRL (GAUZE/BANDAGES/DRESSINGS) IMPLANT
GLOVE SURG ENC MOIS LTX SZ6 (GLOVE) ×2 IMPLANT
GLOVE SURG MICRO LTX SZ6 (GLOVE) ×2 IMPLANT
GLOVE SURG UNDER LTX SZ6.5 (GLOVE) ×2 IMPLANT
GOWN STRL REUS W/TWL LRG LVL3 (GOWN DISPOSABLE) ×2 IMPLANT
GOWN STRL REUS W/TWL XL LVL3 (GOWN DISPOSABLE) ×2 IMPLANT
KIT BASIN OR (CUSTOM PROCEDURE TRAY) ×2 IMPLANT
KIT TURNOVER KIT A (KITS) IMPLANT
MARKER SKIN DUAL TIP RULER LAB (MISCELLANEOUS) ×1 IMPLANT
NEEDLE HYPO 22GX1.5 SAFETY (NEEDLE) ×1 IMPLANT
PACK GENERAL/GYN (CUSTOM PROCEDURE TRAY) ×2 IMPLANT
SUT MNCRL AB 4-0 PS2 18 (SUTURE) ×3 IMPLANT
SUT VIC AB 3-0 SH 27 (SUTURE) ×2
SUT VIC AB 3-0 SH 27XBRD (SUTURE) IMPLANT
SYR CONTROL 10ML LL (SYRINGE) ×1 IMPLANT
TOWEL OR 17X26 10 PK STRL BLUE (TOWEL DISPOSABLE) ×2 IMPLANT
TOWEL OR NON WOVEN STRL DISP B (DISPOSABLE) ×2 IMPLANT

## 2021-08-30 NOTE — Interval H&P Note (Signed)
History and Physical Interval Note:  08/30/2021 9:59 AM  Chris [name]  has presented today for surgery, with the diagnosis of SCALP CYST.  The various methods of treatment have been discussed with the patient and family. After consideration of risks, benefits and other options for treatment, the patient has consented to  Procedure(s): EXCISION OF POSTERIOR SCALP CYST (N/A) as a surgical intervention.  The patient's history has been reviewed, patient examined, no change in status, stable for surgery.  I have reviewed the patient's chart and labs.  Questions were answered to the patient's satisfaction.     Michiko Lineman Rich Brave

## 2021-08-30 NOTE — Discharge Instructions (Addendum)
GENERAL SURGERY: POST OP INSTRUCTIONS  EAT Gradually transition to a high fiber diet with a fiber supplement over the next few weeks after discharge.  Start with a pureed / full liquid diet (see below)  WALK Walk an hour a day (cumulative, not all at once).  Control your pain to do that.    CONTROL PAIN Control pain so that you can walk, sleep, tolerate sneezing/coughing, go up/down stairs.  HAVE A BOWEL MOVEMENT DAILY Keep your bowels regular to avoid problems.  OK to try a laxative to override constipation.  OK to use an antidairrheal to slow down diarrhea.  Call if not better after 2 tries  CALL IF YOU HAVE PROBLEMS/CONCERNS Call if you are still struggling despite following these instructions. Call if you have concerns not answered by these instructions    DIET: Follow a light bland diet & liquids the first 24 hours after arrival home, such as soup, liquids, starches, etc.  Be sure to drink plenty of fluids.  Quickly advance to a usual solid diet within a few days.  Avoid fast food or heavy meals as your are more likely to get nauseated or have irregular bowels.  A low-sugar, high-fiber diet for the rest of your life is ideal.   Take your usually prescribed home medications unless otherwise directed. PAIN CONTROL: Pain is best controlled by a usual combination of three different methods TOGETHER: Ice/Heat Over the counter pain medication Prescription pain medication Most patients will experience some swelling and bruising around the incisions.  Ice packs or heating pads (30-60 minutes up to 6 times a day) will help. Use ice for the first few days to help decrease swelling and bruising, then switch to heat to help relax tight/sore spots and speed recovery.  Some people prefer to use ice alone, heat alone, alternating between ice & heat.  Experiment to what works for you.  Swelling and bruising can take several weeks to resolve.   It is helpful to take an over-the-counter pain  medication regularly for the first few weeks.  Choose one of the following that works best for you: Naproxen (Aleve, etc)  Two 220mg  tabs twice a day OR Ibuprofen (Advil, etc) Three 200mg  tabs four times a day (every meal & bedtime) AND Acetaminophen (Tylenol, etc) 500-650mg  four times a day (every meal & bedtime) A  prescription for pain medication (such as oxycodone, hydrocodone, etc) should be given to you upon discharge.  Take your pain medication as prescribed.  If you are having problems/concerns with the prescription medicine (does not control pain, nausea, vomiting, rash, itching, etc), please call us [phone] to see if we need to switch you to a different pain medicine that will work better for you and/or control your side effect better. If you need a refill on your pain medication, please contact your pharmacy.  They will contact our office to request authorization. Prescriptions will not be filled after 5 pm or on week-ends. Avoid getting constipated.  Between the surgery and the pain medications, it is common to experience some constipation.  Increasing fluid intake and taking a fiber supplement (such as Metamucil, Citrucel, FiberCon, MiraLax, etc) 1-2 times a day regularly will usually help prevent this problem from occurring.  A mild laxative (prune juice, Milk of Magnesia, MiraLax, etc) should be taken according to package directions if there are no bowel movements after 48 hours.   Wash / shower every day, starting 2 days after surgery.  You may shower over the  steri strips as they are waterproof.  Continue to shower over incision(s) after the dressing is off. Steri strips will peel off after 1-2 weeks.  You may leave the incision open to air.   You may replace a dressing/Band-Aid to cover the incision for comfort if you wish.   ACTIVITIES as tolerated:   You may resume regular (light) daily activities beginning the next day--such as daily self-care, walking, climbing  stairs--gradually increasing activities as tolerated.  If you can walk 30 minutes without difficulty, it is safe to try more intense activity such as jogging, treadmill, bicycling, low-impact aerobics, swimming, etc. Save the most intensive and strenuous activity for last such as sit-ups, heavy lifting, contact sports, etc  Refrain from any heavy lifting or straining until you are off narcotics for pain control.   DO NOT PUSH THROUGH PAIN.  Let pain be your guide: If it hurts to do something, don't do it.  Pain is your body warning you to avoid that activity for another week until the pain goes down. You may drive when you are no longer taking prescription pain medication, you can comfortably wear a seatbelt, and you can safely maneuver your car and apply brakes. You may have sexual intercourse when it is comfortable.  FOLLOW UP in our office Please call [facility] at [phone] to set up an appointment to see your surgeon in the office for a follow-up appointment approximately 2-3 weeks after your surgery. Make sure that you call for this appointment the day you arrive home to insure a convenient appointment time. 9. IF YOU HAVE DISABILITY OR FAMILY LEAVE FORMS, BRING THEM TO THE OFFICE FOR PROCESSING.  DO NOT GIVE THEM TO YOUR DOCTOR.   WHEN TO CALL us [phone]: Poor pain control Reactions / problems with new medications (rash/itching, nausea, etc)  Fever over 101.5 F (38.5 C) Worsening swelling or bruising Continued bleeding from incision. Increased pain, redness, or drainage from the incision Difficulty breathing / swallowing   The clinic staff is available to answer your questions during regular business hours (8:30am-5pm).  Please don't hesitate to call and ask to speak to one of our nurses for clinical concerns.   If you have a medical emergency, go to the nearest emergency room or call 911.  A surgeon from [facility] is always on call at the [facility], [address], [place], [place], [place]  [address] ? MAIN: [phone] ? TOLL FREE: [phone] ?  FAX [phone] [email or web address]

## 2021-08-30 NOTE — Op Note (Signed)
Operative Note  Chris [name]  [id number]  [id number]  08/30/2021   Surgeon: Romana Juniper MD   Procedure performed: Excision of posterior scalp mass, 1.5 x 1.5 cm   Preop diagnosis: Posterior scalp mass Post-op diagnosis/intraop findings: Same   Specimens: Posterior scalp mass Retained items: no  EBL: minimal cc Complications: none   Description of procedure: After obtaining informed consent the patient was taken to the operating room and placed in the right lateral decubitus position on the operating room table where MAC was initiated, preoperative antibiotics were administered, SCDs applied, and a formal timeout was performed.  The skin overlying the lesion on the posterior scalp was clipped, prepped and draped usual sterile fashion.  After infiltration with half percent Marcaine with epinephrine, a transverse elliptical incision was made in the dermis dissected with cautery until the subcutaneous tissue was reached and then a careful combination of blunt dissection and cautery ensued to free the mass of its attachments to the surrounding soft tissue.  The lesion was excised intact and handed off for pathology.  Hemostasis was ensured with cautery.  The incision was closed with interrupted deep dermal 3-0 Vicryl and running subcuticular 4 Monocryl.  Benzoin and Steri-Strips were applied. The patient was then awakened and taken to PACU in stable condition.    All counts were correct at the completion of the case.

## 2021-08-30 NOTE — Anesthesia Postprocedure Evaluation (Signed)
Anesthesia Post Note  Patient: Chris [name]  Procedure(s) Performed: EXCISION OF POSTERIOR SCALP CYST     Patient location during evaluation: PACU Anesthesia Type: MAC Level of consciousness: awake and alert Pain management: pain level controlled Vital Signs Assessment: post-procedure vital signs reviewed and stable Respiratory status: spontaneous breathing, nonlabored ventilation, respiratory function stable and patient connected to nasal cannula oxygen Cardiovascular status: stable and blood pressure returned to baseline Postop Assessment: no apparent nausea or vomiting Anesthetic complications: no   No notable events documented.  Last Vitals:  Vitals:   08/30/21 1315 08/30/21 1323  BP: (!) 136/92 (!) 143/87  Pulse: (!) 56 (!) 58  Resp: 13 16  Temp: (!) 36.4 C   SpO2: 97% 100%    Last Pain:  Vitals:   08/30/21 1323  TempSrc:   PainSc: 0-No pain                 Alexia Dinger S

## 2021-08-30 NOTE — Anesthesia Procedure Notes (Signed)
Procedure Name: MAC Date/Time: 08/30/2021 11:54 AM Performed by: Maxwell Caul, CRNA Pre-anesthesia Checklist: Patient identified, Emergency Drugs available, Suction available and Patient being monitored Oxygen Delivery Method: Simple face mask

## 2021-08-30 NOTE — Transfer of Care (Signed)
Immediate Anesthesia Transfer of Care Note  Patient: Chris [name]  Procedure(s) Performed: EXCISION OF POSTERIOR SCALP CYST  Patient Location: PACU  Anesthesia Type:MAC  Level of Consciousness: awake, alert  and oriented  Airway & Oxygen Therapy: Patient Spontanous Breathing and Patient connected to face mask oxygen  Post-op Assessment: Report given to RN and Post -op Vital signs reviewed and stable  Post vital signs: Reviewed and stable  Last Vitals:  Vitals Value Taken Time  BP 118/74 08/30/21 1248  Temp    Pulse 62 08/30/21 1250  Resp 10 08/30/21 1250  SpO2 100 % 08/30/21 1250  Vitals shown include unvalidated device data.  Last Pain:  Vitals:   08/30/21 0933  TempSrc: Oral  PainSc: 0-No pain      Patients Stated Pain Goal: 2 (72/90/21 1155)  Complications: No notable events documented.

## 2021-08-31 ENCOUNTER — Encounter (HOSPITAL_COMMUNITY): Payer: Self-pay | Admitting: Surgery

## 2021-09-06 LAB — SURGICAL PATHOLOGY

## 2021-09-12 NOTE — Progress Notes (Signed)
Histology and Location of Primary Skin Cancer: Posterior Scalp  Chris [name] notes this area on his posterior scalp has been present for at least the last 6 months and has been slowly increasing in size.  Denies pain and drainage, notes some itching to this area.  Past/Anticipated interventions by patient's surgeon/dermatologist for current problematic lesion, if any:  Dr. Kae Heller 08/03/2021 -Excision under MAC 08/30/2021 -Follow-up with surgeon 09/14/2021  Pathology Report: Posterior Scalp 08/30/2021  2.4 x 1 cm excised to a depth of 1.6 cm.  Past skin cancers, if any:  1) Location/Histology/Intervention: Basal Cell, lanced, Right Forearm, Within the last 2 years. 2) Location/Histology/Intervention:  3) Location/Histology/Intervention:   History of Blistering sunburns, if any:   SAFETY ISSUES: Prior radiation? Dr. Lemont Fillers seed,  06/2018 Pacemaker/ICD? No Possible current pregnancy? N/a Is the patient on methotrexate? No  Current Complaints / other details:

## 2021-09-13 ENCOUNTER — Other Ambulatory Visit: Payer: Self-pay

## 2021-09-13 ENCOUNTER — Ambulatory Visit
Admission: RE | Admit: 2021-09-13 | Discharge: 2021-09-13 | Disposition: A | Discharge status: Home / Self Care | Payer: BC Managed Care – PPO | Source: Ambulatory Visit | Attending: Radiation Oncology | Admitting: Radiation Oncology

## 2021-09-13 ENCOUNTER — Encounter: Payer: Self-pay | Admitting: Radiation Oncology

## 2021-09-13 VITALS — BP 140/98 | HR 69 | Temp 97.1°F | Resp 18 | Ht 71.0 in | Wt 247.5 lb

## 2021-09-13 DIAGNOSIS — Z79899 Other long term (current) drug therapy: Secondary | ICD-10-CM | POA: Diagnosis not present

## 2021-09-13 DIAGNOSIS — E785 Hyperlipidemia, unspecified: Secondary | ICD-10-CM | POA: Insufficient documentation

## 2021-09-13 DIAGNOSIS — I2510 Atherosclerotic heart disease of native coronary artery without angina pectoris: Secondary | ICD-10-CM | POA: Diagnosis not present

## 2021-09-13 DIAGNOSIS — C61 Malignant neoplasm of prostate: Secondary | ICD-10-CM

## 2021-09-13 DIAGNOSIS — I219 Acute myocardial infarction, unspecified: Secondary | ICD-10-CM | POA: Diagnosis not present

## 2021-09-13 DIAGNOSIS — C4442 Squamous cell carcinoma of skin of scalp and neck: Secondary | ICD-10-CM

## 2021-09-13 DIAGNOSIS — I252 Old myocardial infarction: Secondary | ICD-10-CM | POA: Diagnosis not present

## 2021-09-13 DIAGNOSIS — K219 Gastro-esophageal reflux disease without esophagitis: Secondary | ICD-10-CM | POA: Diagnosis not present

## 2021-09-13 DIAGNOSIS — F17210 Nicotine dependence, cigarettes, uncomplicated: Secondary | ICD-10-CM | POA: Diagnosis not present

## 2021-09-13 NOTE — Progress Notes (Signed)
Radiation Oncology         [phone] ________________________________  Name: Chris [name]        MRN: [id number]  Date of Service: 09/13/2021 DOB: Nov 01, 1960  KX:FGHWEX, [name], MD  Allyn Kenner, MD     REFERRING PHYSICIAN: Allyn Kenner, MD   DIAGNOSIS: The primary encounter diagnosis was Malignant neoplasm of prostate [facility]). A diagnosis of Squamous cell carcinoma of scalp was also pertinent to this visit.   HISTORY OF PRESENT ILLNESS: Chris [name] is a 60 y.o. male seen at the request of Dr. Nevada Crane for a diagnosis of squamous cell carcinoma of the skin.  Patient has a history o fStage T1c adenocarcinoma of the prostate with Gleason Score of 3+4, and PSA of 5.1 and had previously been a patient of Dr. Johny Shears.  It appears that he underwent brachytherapy for treatment of his cancer in September 2019 with radioactive seed implant.  He has continued to see Dr. Diona Fanti in surveillance.  More recently however the patient was noted to have a lesion in the posterior scalp. He saw dermatology and they were suspicious this was a cyst, so he was referred to Dr. Windle Guard at [place]. He underwent excision of this on 08/30/2021 which revealed a poorly differentiated squamous cell carcinoma extending to the edges of the excisional margin.  The specimen was 2.4 x 1 cm excised to a depth of 1.6 cm.  In the dermatology notes it said that he had a palpable left cervical lymph node, but it does not appear that this was imaged. He's seen today to discuss next steps of his care, but has not yet met with a Mohs' Surgeon.    PREVIOUS RADIATION THERAPY:  07/17/18:  Insertion of radioactive I-125 seeds into the prostate gland;145 Gy, definitive therapy with SpaceOAR gel placement.    PAST MEDICAL HISTORY:  Past Medical History:  Diagnosis Date   Anxiety    associated with medical care,  worsened by difficulty hearing, does better with wife present   Complication of anesthesia    wife states very anxious may  need pre sedation   Coronary artery disease    GERD (gastroesophageal reflux disease)    Hyperlipidemia    MI (myocardial infarction) ([place]) 04/17/2006   Acute inferolateral wall MI with cardiogenic shock and complete heart block   Prostate cancer ([place])    Tobacco abuse    Wears glasses    Wears partial dentures    Upper       PAST SURGICAL HISTORY: Past Surgical History:  Procedure Laterality Date   CARDIAC CATHETERIZATION  2007   left, RCA 100% occluded ruptured plaque with thrombus in the proximal segment   CYST EXCISION N/A 08/30/2021   Procedure: EXCISION OF POSTERIOR SCALP CYST;  Surgeon: Clovis [place], MD;  Location: [facility];  Service: General;  Laterality: N/A;   CYSTOSCOPY N/A 07/17/2018   Procedure: Consuela Mimes;  Surgeon: Franchot Gallo, MD;  Location: [facility];  Service: Urology;  Laterality: N/A;  no seeds in bladder per Dr Diona Fanti   PROSTATE BIOPSY     RADIOACTIVE SEED IMPLANT N/A 07/17/2018   Procedure: RADIOACTIVE SEED IMPLANT/BRACHYTHERAPY IMPLANT;  Surgeon: Franchot Gallo, MD;  Location: [facility];  Service: Urology;  Laterality: N/A;  77 seeds   SPACE OAR INSTILLATION N/A 07/17/2018   Procedure: SPACE OAR INSTILLATION;  Surgeon: Franchot Gallo, MD;  Location: [facility];  Service: Urology;  Laterality: N/A;   WRIST SURGERY  10/04/2012  FAMILY HISTORY:  Family History  Problem Relation Age of Onset   Prostate cancer Neg Hx    Kidney cancer Neg Hx    Cancer Neg Hx      SOCIAL HISTORY:  reports that he has been smoking cigarettes. He has a 63.00 pack-year smoking history. He has never used smokeless tobacco. He reports that he does not drink alcohol and does not use drugs. The patient is married and lives in [place]. He owns a [employer].   ALLERGIES: Patient has no known allergies.   MEDICATIONS:  Current Outpatient Medications  Medication Sig Dispense Refill    atorvastatin (LIPITOR) 20 MG tablet Take 1 tablet (20 mg total) by mouth daily. 90 tablet 3   clopidogrel (PLAVIX) 75 MG tablet Take 1 tablet (75 mg total) by mouth daily. TAKE 1 TABLET (75 MG TOTAL) BY MOUTH DAILY. 90 tablet 3   ketoconazole (NIZORAL) 2 % cream Apply 1 application topically 2 (two) times daily as needed for irritation.     metoprolol tartrate (LOPRESSOR) 25 MG tablet Take 1 tablet (25 mg total) by mouth daily. 30 tablet 11   omeprazole (PRILOSEC OTC) 20 MG tablet Take 20 mg by mouth daily.     tamsulosin (FLOMAX) 0.4 MG CAPS capsule Take 1 capsule (0.4 mg total) by mouth daily. 30 capsule 3   No current facility-administered medications for this encounter.     REVIEW OF SYSTEMS: On review of systems, the patient reports that he has been very nervous about his diagnosis. He reports he has also had some fullness and discomfort in the surgical site. He has joint aches and pains throughout his body. No specific complaints are otherwise noted.      PHYSICAL EXAM:  Wt Readings from Last 3 Encounters:  09/13/21 247 lb 8 oz (112.3 kg)  08/28/21 241 lb (109.3 kg)  01/13/21 250 lb 6.4 oz (113.6 kg)   Temp Readings from Last 3 Encounters:  09/13/21 (!) 97.1 F (36.2 C) (Temporal)  08/30/21 (!) 97.5 F (36.4 C)  08/28/21 98.1 F (36.7 C) (Oral)   BP Readings from Last 3 Encounters:  09/13/21 (!) 140/98  08/30/21 (!) 143/87  08/28/21 (!) 175/109   Pulse Readings from Last 3 Encounters:  09/13/21 69  08/30/21 (!) 58  08/28/21 (!) 57   Pain Assessment Pain Score: 3  Pain Loc: Head (Back of head, recent surgery.)/10  In general this is a well appearing caucasian male in no acute distress. He's alert and oriented x4 and appropriate throughout the examination. Cardiopulmonary assessment is negative for acute distress and he exhibits normal effort. His posterior scalp at the midline has a well healing 3 cm horizontal incision site without erythema, separation, fluctuance or  drainage. No grossly palpalbe adenopathy is noted of either of the cervical, submental, or posterior auricular nodal chains are appreciated.     ECOG = 1  0 - Asymptomatic (Fully active, able to carry on all predisease activities without restriction)  1 - Symptomatic but completely ambulatory (Restricted in physically strenuous activity but ambulatory and able to carry out work of a light or sedentary nature. For example, light housework, office work)  2 - Symptomatic, <50% in bed during the day (Ambulatory and capable of all self care but unable to carry out any work activities. Up and about more than 50% of waking hours)  3 - Symptomatic, >50% in bed, but not bedbound (Capable of only limited self-care, confined to bed or chair 50% or more of  waking hours)  4 - Bedbound (Completely disabled. Cannot carry on any self-care. Totally confined to bed or chair)  5 - Death   Eustace Pen MM, Creech RH, Tormey DC, et al. [phone]). "Toxicity and response criteria of the [facility]". [place] Oncol. 5 (6): 649-55    LABORATORY DATA:  Lab Results  Component Value Date   WBC 7.7 08/28/2021   HGB 16.5 08/28/2021   HCT 49.3 08/28/2021   MCV 89.0 08/28/2021   PLT 240 08/28/2021   Lab Results  Component Value Date   NA 137 08/28/2021   K 3.9 08/28/2021   CL 106 08/28/2021   CO2 24 08/28/2021   Lab Results  Component Value Date   ALT 27 01/05/2021   AST 23 01/05/2021   ALKPHOS 70 01/05/2021   BILITOT 1.0 01/05/2021      RADIOGRAPHY: No results found.     IMPRESSION/PLAN: 1. Poorly differentiated Squamous Cell Carcinoma of the left posterior scalp with positive margin.  Dr. Lisbeth Renshaw discusses the pathology findings and reviews the nature of skin cancer. He recommends proceeding with a CT neck with contrast to rule out nodal disease, as well as referral to Mohs' Surgeon. We will send him to meet with Dr. Winifred Olive and appreciate his recommendations for next steps. We  suspect he would offer Mohs' Surgery. If he has positive margins or still has high risk local features ie poory differentiated disease, Dr. Lisbeth Renshaw may also consider radiotherapy even if margins were clear.   We discussed the risks, benefits, short, and long term effects of radiotherapy, as well as the curative intent, and the patient is interested in proceeding. Dr. Lisbeth Renshaw discusses the delivery and logistics of radiotherapy and anticipates a course of 4 weeks of radiotherapy. Written consent is obtained and placed in the chart, a copy was provided to the patient. He will be contacted by myself once we know next steps with Dr. Winifred Olive and provided CT scan is negative for metastatic disease. At the appropriate time if he needs radiation, our simulation staff would call him to proceed with planning for radiation.  In a visit lasting 60 minutes, greater than 50% of the time was spent face to face discussing the patient's condition, in preparation for the discussion, and coordinating the patient's care.   The above documentation reflects my direct findings during this shared patient visit. Please see the separate note by Dr. Lisbeth Renshaw on this date for the remainder of the patient's plan of care.    Carola Rhine, [facility]   **Disclaimer: This note was dictated with voice recognition software. Similar sounding words can inadvertently be transcribed and this note may contain transcription errors which may not have been corrected upon publication of note.**

## 2021-09-14 ENCOUNTER — Telehealth: Payer: Self-pay | Admitting: *Deleted

## 2021-09-14 NOTE — Telephone Encounter (Signed)
Called patient to inform of CT for 09-19-21- arrival time- 12:45 pm @ [facility], patient to have water only - 4 hrs. prior to test, spoke with patient's wife- [name] and she is aware of this test

## 2021-09-15 ENCOUNTER — Telehealth: Payer: Self-pay | Admitting: *Deleted

## 2021-09-15 NOTE — Telephone Encounter (Signed)
Returned patient's wife's phone call, spoke with patient's wife, Chris [name]

## 2021-09-19 ENCOUNTER — Ambulatory Visit (HOSPITAL_COMMUNITY): Payer: BC Managed Care – PPO

## 2021-09-20 ENCOUNTER — Encounter (HOSPITAL_COMMUNITY): Payer: Self-pay

## 2021-09-20 ENCOUNTER — Other Ambulatory Visit: Payer: Self-pay | Admitting: Radiation Oncology

## 2021-09-20 ENCOUNTER — Other Ambulatory Visit: Payer: Self-pay

## 2021-09-20 ENCOUNTER — Ambulatory Visit (HOSPITAL_COMMUNITY)
Admission: RE | Admit: 2021-09-20 | Discharge: 2021-09-20 | Disposition: A | Discharge status: Home / Self Care | Payer: BC Managed Care – PPO | Source: Ambulatory Visit | Attending: Radiation Oncology | Admitting: Radiation Oncology

## 2021-09-20 DIAGNOSIS — R590 Localized enlarged lymph nodes: Secondary | ICD-10-CM | POA: Diagnosis not present

## 2021-09-20 DIAGNOSIS — J341 Cyst and mucocele of nose and nasal sinus: Secondary | ICD-10-CM | POA: Diagnosis not present

## 2021-09-20 DIAGNOSIS — C4442 Squamous cell carcinoma of skin of scalp and neck: Secondary | ICD-10-CM | POA: Insufficient documentation

## 2021-09-20 DIAGNOSIS — I6523 Occlusion and stenosis of bilateral carotid arteries: Secondary | ICD-10-CM | POA: Diagnosis not present

## 2021-09-20 IMAGING — CT CT NECK W/ CM
5 series · 16 of 33 positions shown, 18 images · IV contrast (OMNIPAQUE)
Comparison: None.

CLINICAL DATA: Squamous cell carcinoma scalp. Question left
posterior cervical lymph node.

EXAM:
CT NECK WITH CONTRAST
TECHNIQUE: Multidetector CT imaging of the neck was performed using the
standard protocol following the bolus administration of intravenous
contrast.
CONTRAST:  60mL OMNIPAQUE IOHEXOL 350 MG/ML SOLN

[Series 2: axial neck · axial · 0.55mm/px · z∈[+1286,+1378]mm · 2 of 138 slices shown]
[im 46/138  bone]
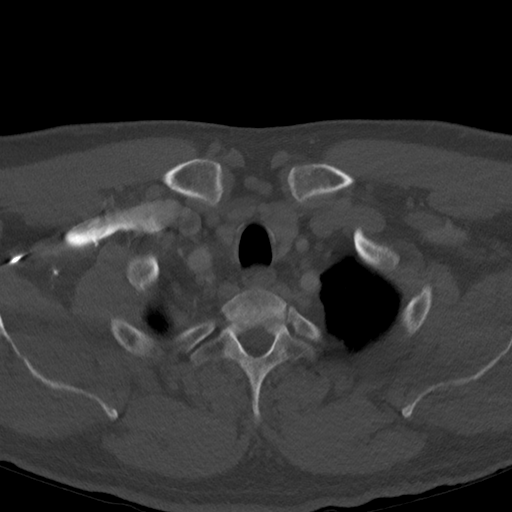
[im 92/138  bone]
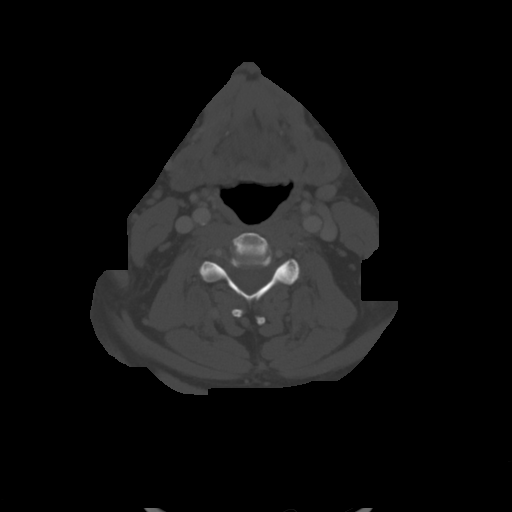

[Series 4: axial bone · axial · 0.55mm/px · z∈[+1264,+1400]mm · 3 of 138 slices shown, 4 images]
[im 35/138  soft-tissue]
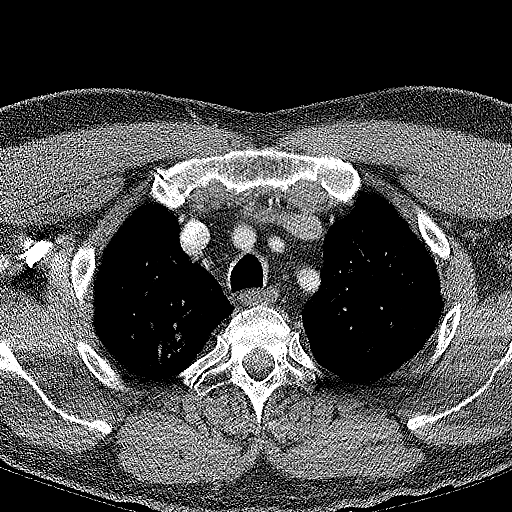
[im 35/138  bone]
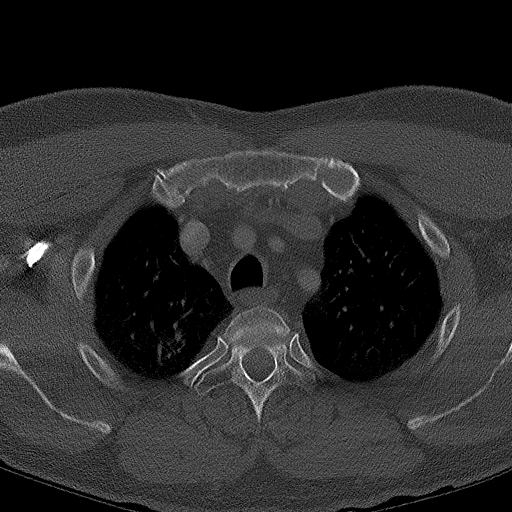
[im 69/138  bone]
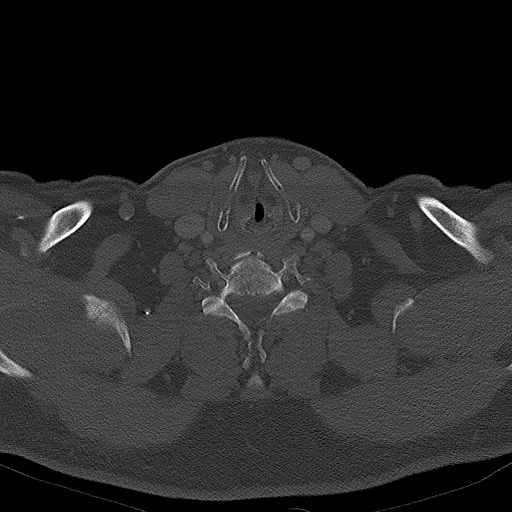
[im 103/138  bone]
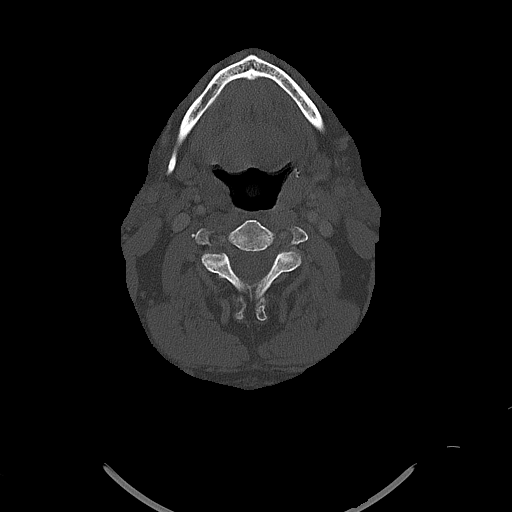

[Series 5: orthogonal (person_name) · axial · 0.44mm/px · z∈[+1268,+1404]mm · 3 of 137 slices shown]
[im 35/137  bone]
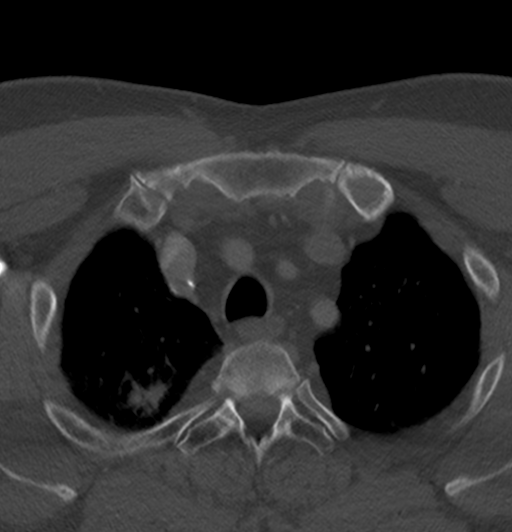
[im 69/137  bone]
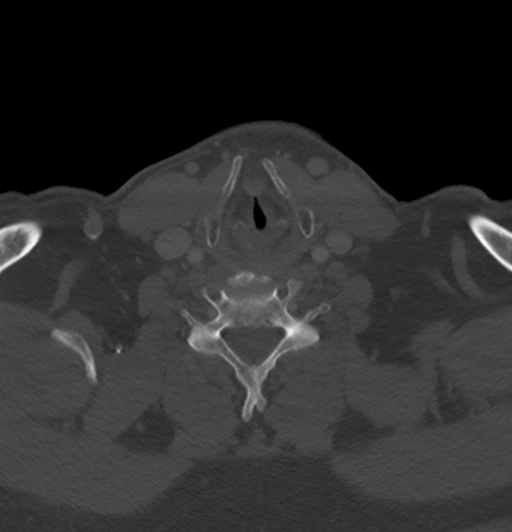
[im 103/137  bone]
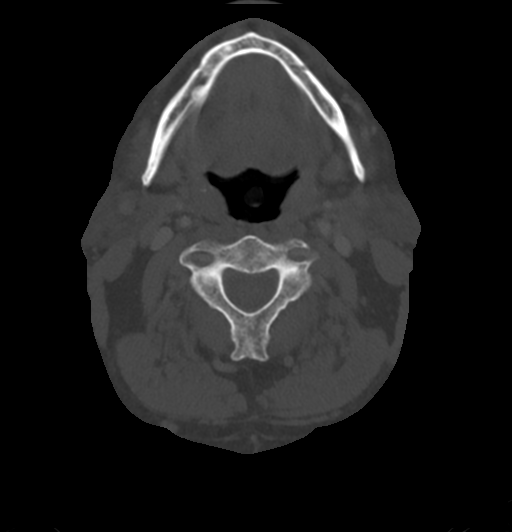

[Series 6: cor neck · coronal · 0.63mm/px · 3 of 111 slices shown]
[im 23/111  bone]
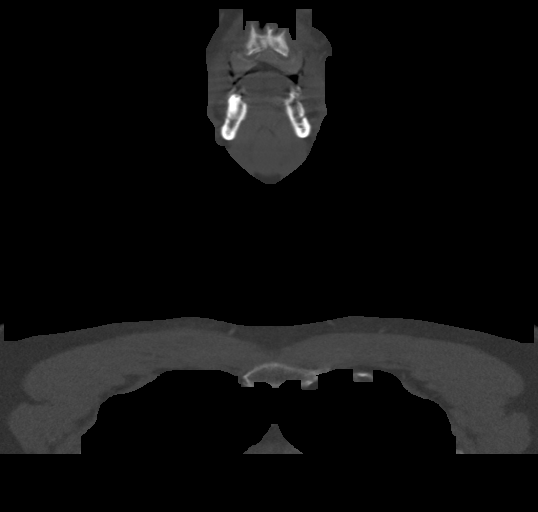
[im 45/111  bone]
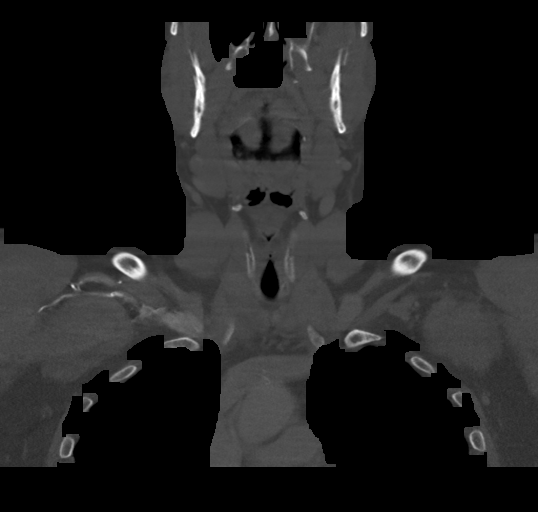
[im 67/111  bone]
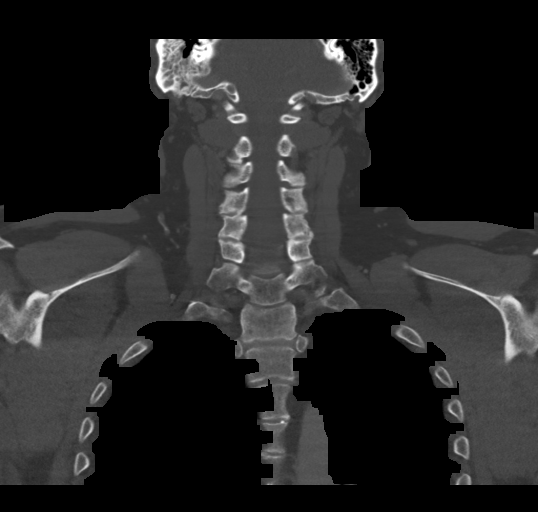

[Series 7: sag neck · sagittal · 0.58mm/px · 5 of 101 slices shown, 6 images]
[im 34/101  bone]
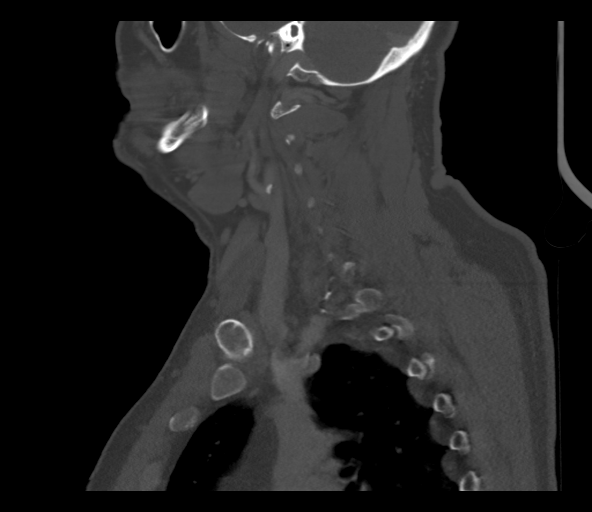
[im 42/101  bone]
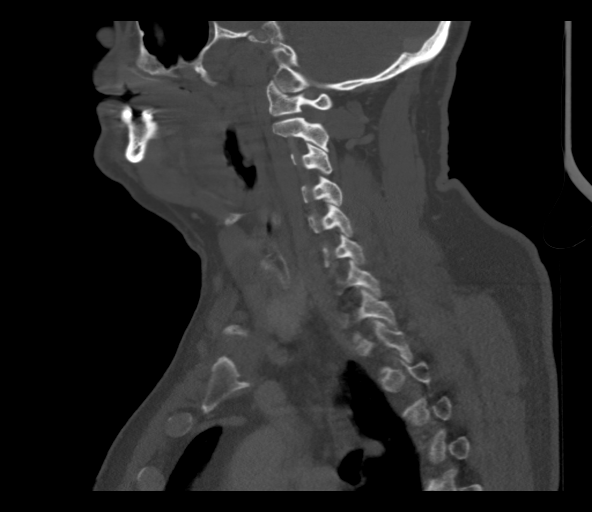
[im 51/101  soft-tissue]
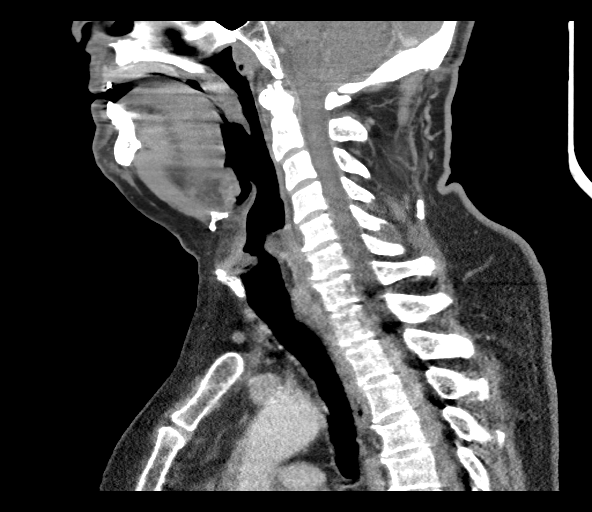
[im 51/101  bone]
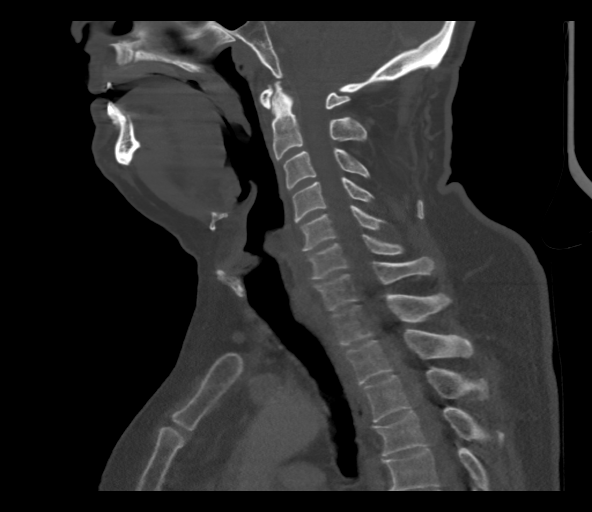
[im 59/101  bone]
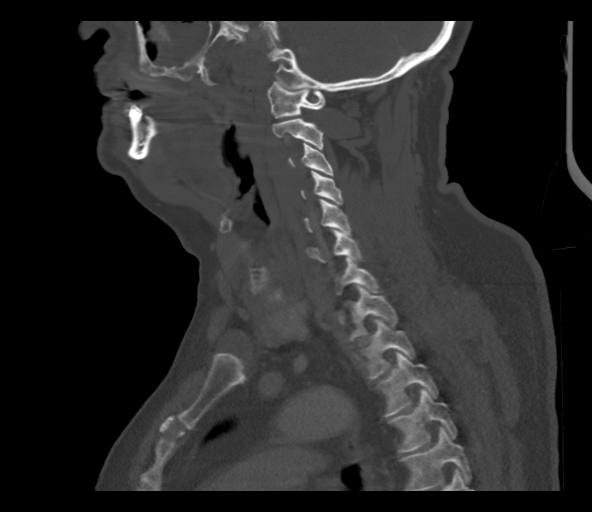
[im 67/101  bone]
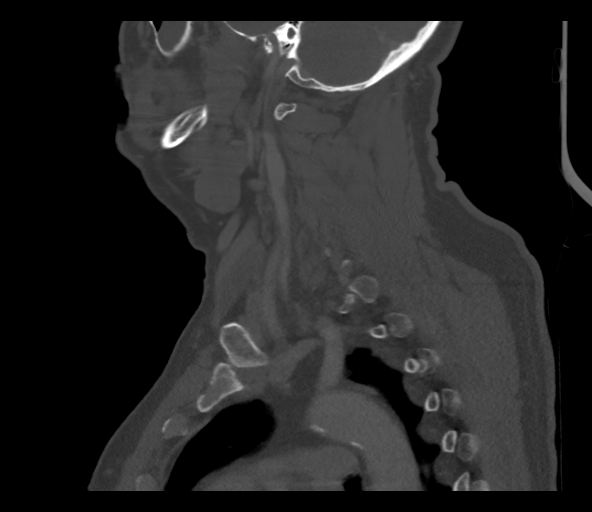

[16 of 33 positions shown; findings below may reference images not displayed]

FINDINGS: Pharynx and larynx: Normal. No mass or swelling.

Salivary glands: No inflammation, mass, or stone.

Thyroid: Negative

Lymph nodes: Possible abnormality in the left posterior scalp was
not localized by the technologist. No suspicious lymph node is seen
in the left posterior scalp.

7.5 mm lymph node in the right posterior scalp at the C3 level. This
has ill-defined margins and is indeterminate but could represent
metastatic disease.

No pathologically enlarged lymph nodes are seen in the anterior
neck.

Vascular: Normal vascular enhancement. Mild atherosclerotic disease
in the carotid arteries bilaterally.

Limited intracranial: Negative

Visualized orbits: Not imaged

Mastoids and visualized paranasal sinuses: Large retention cyst left
maxillary sinus. Remaining sinuses clear. Right mastoid effusion.

Skeleton: No evidence of skeletal metastasis. Mild compression
fracture T4 and T5 which are probably chronic.

Upper chest: 17 mm nodule right upper lobe with spiculated margins.

Other: None
IMPRESSION: 1. 7.5 mm posterior lymph node on the right which is indeterminate
but could be due to metastatic disease. Consider biopsy. No enlarged
lymph nodes in the left posterior neck or scalp.
2. 17 mm spiculated mass right upper lobe, suspicious for carcinoma
lung. Recommend CT chest with contrast for further evaluation.

## 2021-09-20 MED ORDER — IOHEXOL 350 MG/ML SOLN
60.0000 mL | Freq: Once | INTRAVENOUS | Status: AC | PRN
  Administered 2021-09-20: 60 mL via INTRAVENOUS

## 2021-09-21 ENCOUNTER — Encounter: Payer: Self-pay | Admitting: Radiation Oncology

## 2021-09-22 ENCOUNTER — Telehealth: Payer: Self-pay | Admitting: Radiation Oncology

## 2021-09-22 NOTE — Telephone Encounter (Signed)
I called the patient and spoke with his wife and discussed the CT imaging results. We discussed PET scan and likely biopsy of the cervical node on CT. I will also reach out to pathology to see if they can look again at the specimen from his scalp resection to see if there is any chance this could be related to his lung finding. We will follow up with next steps early next week with this and PET at the end of the week next week.

## 2021-09-25 ENCOUNTER — Telehealth: Payer: Self-pay | Admitting: Radiation Oncology

## 2021-09-25 ENCOUNTER — Ambulatory Visit: Payer: BC Managed Care – PPO | Admitting: Radiation Oncology

## 2021-09-25 NOTE — Telephone Encounter (Signed)
I called the patient's wife and left a message to let her know that I spoke with Dr. Melina Copa in pathology. I will await her call back to discuss  his biopsy can't rule in or out lung cancer, and that sampling the node would not help elicit this either. The best route would be to see if pulmonary can bronch him for tissue. I have reached out to Dr. Valeta Harms and Dr. Lamonte Sakai as well to see if either one of them can seen the patient. I will cancel the biopsy order for sampling his neck node.

## 2021-09-25 NOTE — Telephone Encounter (Signed)
The patient's wife called back and we were able to review recommendations for PET and Pulmonary eval with likely bronchoscopy.

## 2021-09-27 ENCOUNTER — Telehealth: Payer: Self-pay | Admitting: *Deleted

## 2021-09-27 ENCOUNTER — Other Ambulatory Visit: Payer: Self-pay | Admitting: Radiation Oncology

## 2021-09-27 DIAGNOSIS — R911 Solitary pulmonary nodule: Secondary | ICD-10-CM

## 2021-09-27 DIAGNOSIS — C4442 Squamous cell carcinoma of skin of scalp and neck: Secondary | ICD-10-CM

## 2021-09-27 NOTE — Telephone Encounter (Signed)
xxxx 

## 2021-09-27 NOTE — Telephone Encounter (Signed)
Called patient's wife- [name] to inform of Pet Scan for 09-28-21- arrival time- 1 pm, patient to have water only - 6 hrs. prior to test, test to be @ [facility], spoke with patient's wife [name] and she is aware of this test.

## 2021-09-28 ENCOUNTER — Ambulatory Visit (HOSPITAL_COMMUNITY): Payer: BC Managed Care – PPO

## 2021-09-28 ENCOUNTER — Encounter (HOSPITAL_COMMUNITY)
Admission: RE | Admit: 2021-09-28 | Discharge: 2021-09-28 | Disposition: A | Discharge status: Home / Self Care | Payer: BC Managed Care – PPO | Source: Ambulatory Visit | Attending: Radiation Oncology | Admitting: Radiation Oncology

## 2021-09-28 ENCOUNTER — Other Ambulatory Visit: Payer: Self-pay

## 2021-09-28 DIAGNOSIS — R911 Solitary pulmonary nodule: Secondary | ICD-10-CM | POA: Insufficient documentation

## 2021-09-28 DIAGNOSIS — K769 Liver disease, unspecified: Secondary | ICD-10-CM | POA: Diagnosis not present

## 2021-09-28 DIAGNOSIS — C4442 Squamous cell carcinoma of skin of scalp and neck: Secondary | ICD-10-CM | POA: Diagnosis not present

## 2021-09-28 DIAGNOSIS — R918 Other nonspecific abnormal finding of lung field: Secondary | ICD-10-CM | POA: Diagnosis not present

## 2021-09-28 DIAGNOSIS — R590 Localized enlarged lymph nodes: Secondary | ICD-10-CM | POA: Diagnosis not present

## 2021-09-28 LAB — GLUCOSE, CAPILLARY: Glucose-Capillary: 97 mg/dL (ref 70–99)

## 2021-09-28 IMAGING — PT NM PET TUM IMG INITIAL (PI) SKULL BASE T - THIGH
7 series · 25 of 25 positions shown · non-contrast
Comparison: CT neck [DATE].

CLINICAL DATA: Initial treatment strategy for squamous cell
carcinoma of the scalp. Right apical pulmonary nodule and posterior
cervical adenopathy on CT.

EXAM:
NUCLEAR MEDICINE PET SKULL BASE TO THIGH
TECHNIQUE: 12.3 mCi F-18 FDG was injected intravenously. Full-ring PET imaging
was performed from the skull base to thigh after the radiotracer. CT
data was obtained and used for attenuation correction and anatomic
localization.
Fasting blood glucose: 97 mg/dl

[Series 3: pet sk_thigh ac · axial · 5.0mm · 4.07mm/px · z∈[-1480,-448]mm · 5 of 259 slices shown]
[im 1/259]
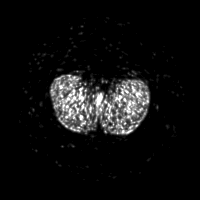
[im 65/259]
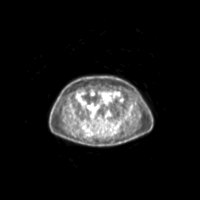
[im 130/259]
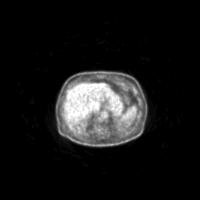
[im 194/259]
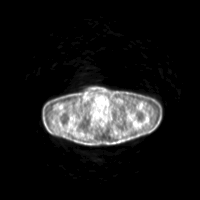
[im 259/259]
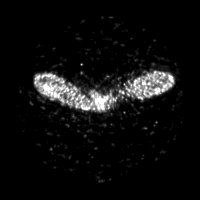

[Series 4: ct sk_thigh 5.0 bf37 · axial · 5.0mm · 0.98mm/px · z∈[-1480,-448]mm · 6 of 259 slices shown]
[im 1/259  brain]
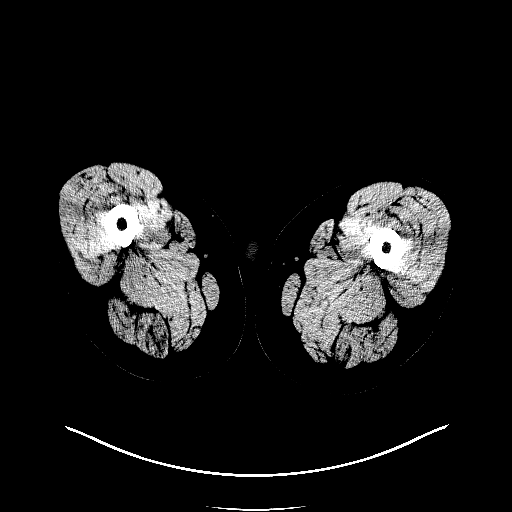
[im 52/259  brain]
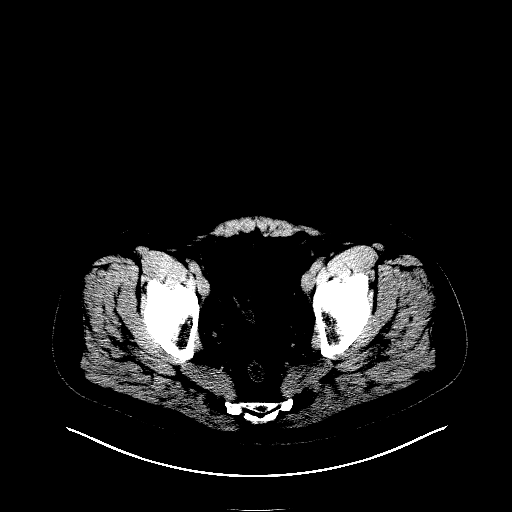
[im 104/259  brain]
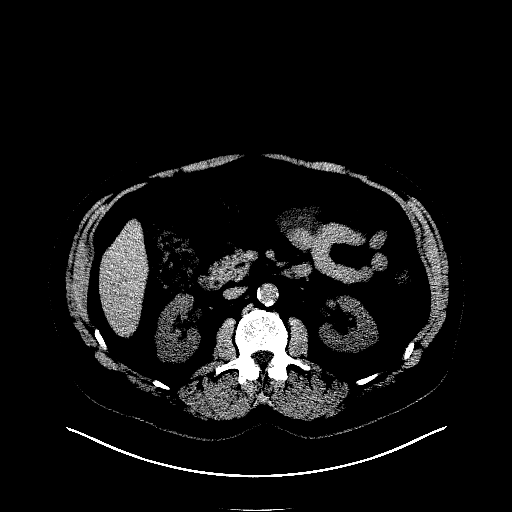
[im 155/259]
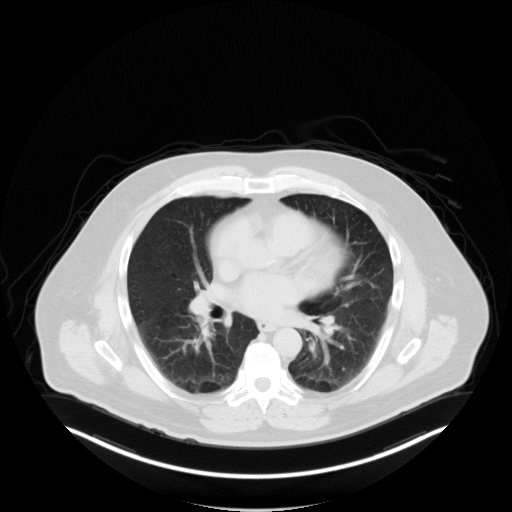
[im 207/259  brain]
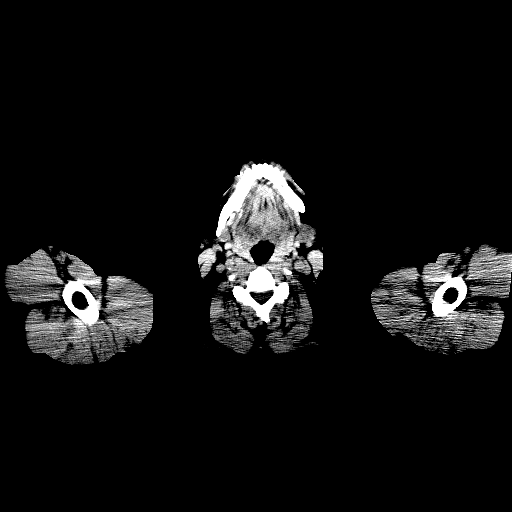
[im 259/259]
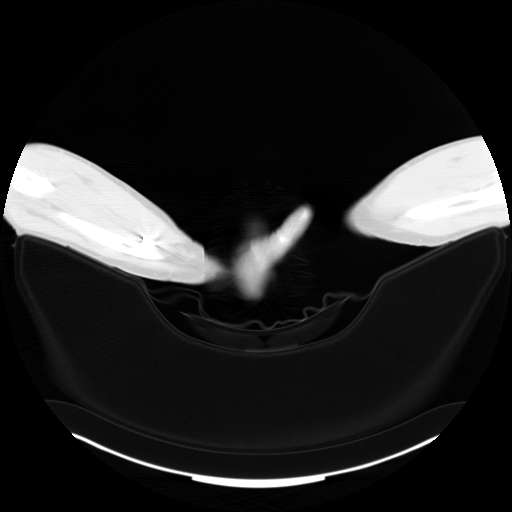

[Series 5: pet sk_thigh nac · axial · 5.0mm · 4.07mm/px · z∈[-1480,-448]mm · 6 of 259 slices shown]
[im 1/259]
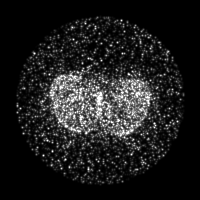
[im 52/259]
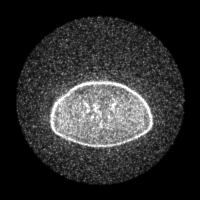
[im 104/259]
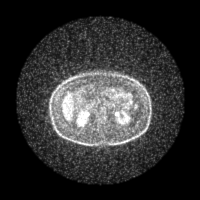
[im 155/259]
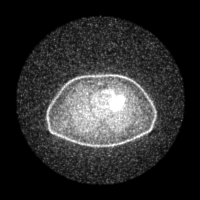
[im 207/259]
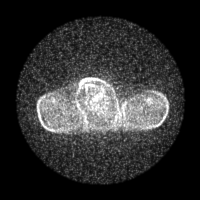
[im 259/259]
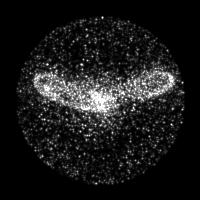

[Series 8: ct sk_thigh 5.0 br59 lung_bone · axial · 5.0mm · 0.72mm/px · 1 of 69 slices shown]
[im 1/69]
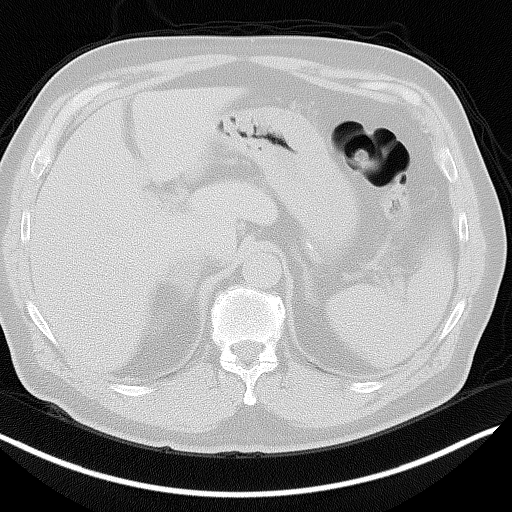

[Series 603: fused cor · 1 of 46 slices shown]
[im 1/46]
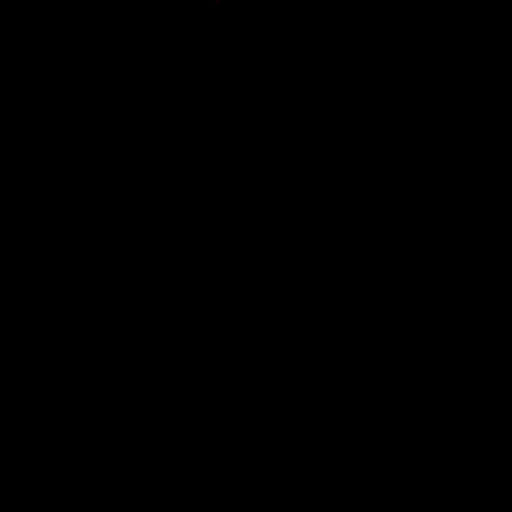

[Series 604: <mip collection> · coronal · 2.14mm/px · 1 of 32 slices shown]
[im 1/32]
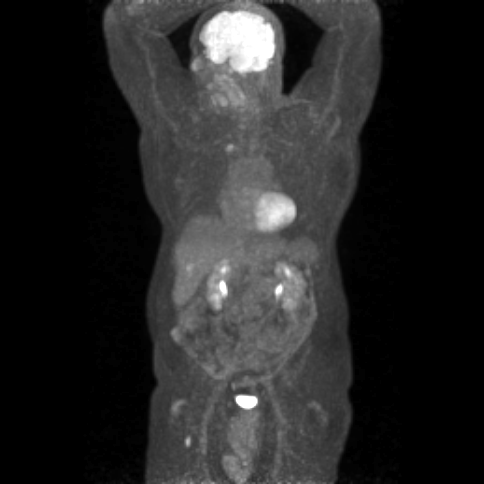

[Series 605: range-ct sk_thigh 5.0 bf37-tra-<alpha range> · 5 of 253 slices shown]
[im 1/253]
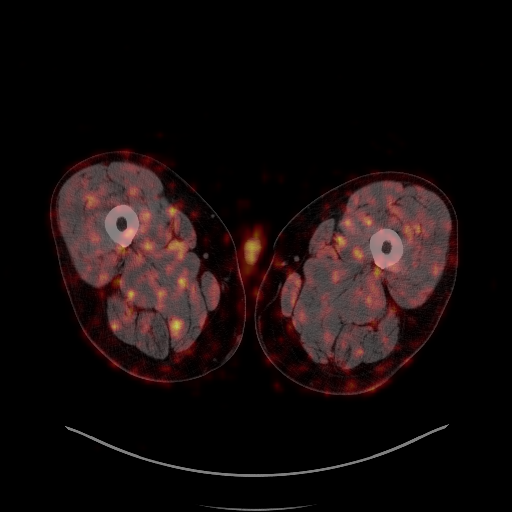
[im 64/253]
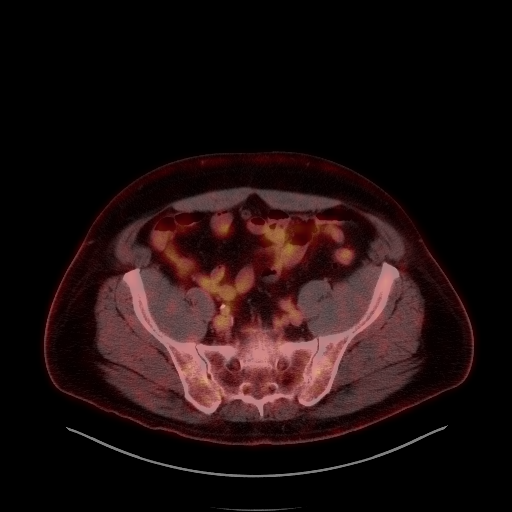
[im 127/253]
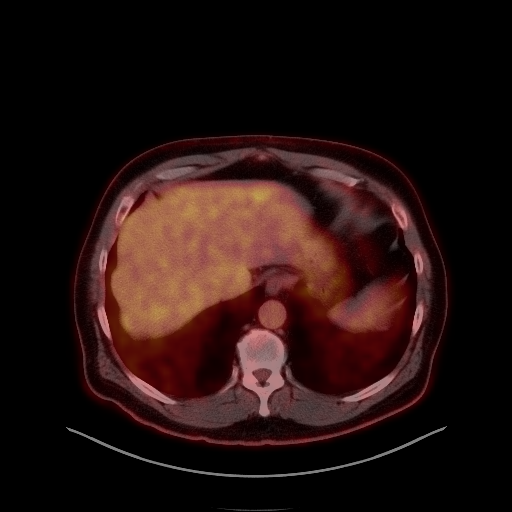
[im 190/253]
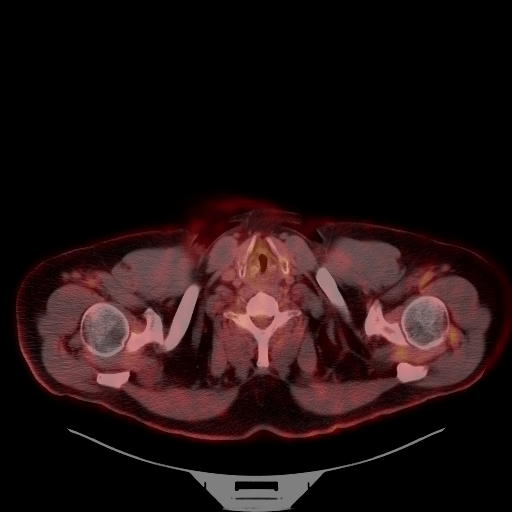
[im 253/253]
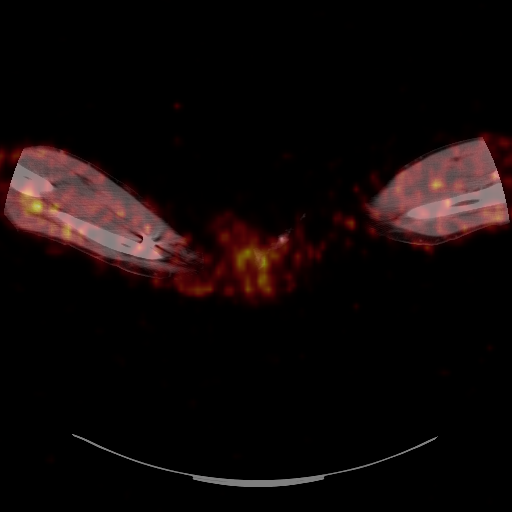

[25 of 25 positions shown; findings below may reference images not displayed]

FINDINGS: Mediastinal blood pool activity: SUV max

NECK:

Imaging of the head and neck is limited by motion artifact and
resulting misregistration between the PET and CT images. There is
hypermetabolic activity in the central occipital scalp (SUV max 5.8)
which may correspond with the patient's known scalp lesion. The
previously demonstrated small right occipital lymph node is mildly
hypermetabolic (SUV max 2.9). No other hypermetabolic cervical lymph
nodes are identified.Mucosal assessment limited due to the motion.
Activity within the lymphoid tissue of Waldeyer's ring is grossly
unremarkable.

Incidental CT findings: Left maxillary sinus retention cysts and
bilateral carotid atherosclerosis.

CHEST:

There are no hypermetabolic mediastinal, hilar or axillary lymph
nodes. The previously demonstrated irregular right apical lesion is
hypermetabolic. This lesion measures 2.4 x 1.3 cm on image [DATE] and
has an SUV max of 5.2, suspicious for malignancy. No other
hypermetabolic pulmonary activity or suspicious nodularity.

Incidental CT findings: Mild atherosclerosis of the aorta, great
vessels and coronary arteries.

ABDOMEN/PELVIS:

Focal hypermetabolic activity inferiorly in the right hepatic lobe,
corresponding with a 1.8 x 1.5 cm lesion in segment 5 (image 146/4).
This has an SUV max of 5.7. No other hypermetabolic activity
identified in the liver, spleen, pancreas or adrenal glands. There
is no hypermetabolic nodal activity.

Incidental CT findings: Mild low-density enlargement of both adrenal
glands without hypermetabolic activity, consistent with hyperplasia
or small adenomas. Seed implants are noted within the prostate
gland. Mild aortic and branch vessel atherosclerosis.

SKELETON:

There is a focal hypermetabolic lesion within the proximal right
femoral diaphysis (SUV max 7.4). This corresponds with an area of
increased density within the medullary cavity on CT image 237/4. No
other hypermetabolic osseous activity.

Incidental CT findings: none
IMPRESSION: 1. The spiculated nodule at the right lung apex on recent neck CT is
hypermetabolic and is concerning for primary bronchogenic carcinoma.
Tissue sampling recommended.
2. Hypermetabolic activity within the occipital scalp and small
previously demonstrated right occipital lymph node compatible with
known squamous cell carcinoma. Evaluation of the head and neck
limited by motion artifact.
3. Hypermetabolic lesions inferiorly in the right hepatic lobe and
in the proximal right femoral diaphysis suspicious for metastatic
disease, primary uncertain in this patient with a history of
prostate cancer. Correlate with PSA levels. Abdominal MRI without
and with contrast may be helpful for further characterization of the
liver lesion.

## 2021-09-28 MED ORDER — FLUDEOXYGLUCOSE F - 18 (FDG) INJECTION
12.3000 | Freq: Once | INTRAVENOUS | Status: AC | PRN
  Administered 2021-09-28: 12.3 via INTRAVENOUS

## 2021-09-29 ENCOUNTER — Encounter (HOSPITAL_COMMUNITY): Payer: Self-pay

## 2021-09-29 ENCOUNTER — Other Ambulatory Visit: Payer: Self-pay | Admitting: Radiation Oncology

## 2021-09-29 ENCOUNTER — Ambulatory Visit (HOSPITAL_COMMUNITY): Payer: BC Managed Care – PPO

## 2021-09-29 DIAGNOSIS — K769 Liver disease, unspecified: Secondary | ICD-10-CM

## 2021-09-29 NOTE — Progress Notes (Signed)
I left a voicemail for the patient's wife outlining the PET scan results and Dr. Ida Rogue recommendation for him to have an MRI of his liver, possibly a biopsy if the MRI is suspicious.

## 2021-10-02 ENCOUNTER — Telehealth: Payer: Self-pay | Admitting: *Deleted

## 2021-10-02 ENCOUNTER — Telehealth: Payer: Self-pay | Admitting: Radiation Oncology

## 2021-10-02 NOTE — Telephone Encounter (Signed)
Called patient's wife- [name] to inform of MRI for 10-05-21- arrival time- 6:30 pm , patient to be NPO- 4 hrs. prior to test, test to be @ [facility], lvm for a return call

## 2021-10-02 NOTE — Telephone Encounter (Signed)
I called and spoke with the patient's wife in summary this is a pleasant 60 year old male who originally presented to dermatology with a lesion on his scalp.  It was felt that this was cystic in nature rather than dermatologic and he was sent to general surgery.  This was surgically excised on 08/30/2021 and showed poorly differentiated squamous cell carcinoma extending to the edges of the surgical excision specimen.  It was felt by clinical examination and the dermatology assessment that he had adenopathy of the cervical lymph node chain and a CT scan of the neck was ordered after we met him showing concerns for adenopathy in the posterior cervical chain more so on the right.  Incidentally there was a right upper lobe nodule.  A PET scan was ordered and performed last week that showed hypermetabolism in the scalp, low-level activity in the right cervical lymph node, hypermetabolism in the right upper lobe nodule, hypermetabolism in a lesion in the liver and hypermetabolism within the right femur.  Unfortunately this may all be 1 process.  While the patient does have a history of prostate cancer it was a 3+4 cancer with a PSA of 5.1 to start with, he received definitive treatment with prostate seed implant PSA levels since with his urologist.  Clinical picture is much more suspicious for metastatic lung cancer.  Dr. Lisbeth Renshaw has recommended continuing with our plan to ask pulmonary medicine to see if they feel like he can have navigational bronchoscopy to the lesion for tissue.  He also recommends MRI of the liver to further evaluate the PET positive finding and if suspicious subsequently a biopsy.  The patient's wife is also aware that if Dr. Katy Fitch does not feel that bronchoscopy is feasible we could ask interventional radiology for both a biopsy of his lung and liver as both biopsies would be helpful in differentiating stage as well as histology.  She is also aware that he will likely need the attention of medical  oncology and as we suspect that this is a lung cancer primary I will coordinate with Dr. Julien Nordmann and Norton Blizzard, RN, the Lung Cancer Nurse Navigator.  She is also aware that if this is truly lung cancer as we suspect that an MRI of the brain upon confirmation of his diagnosis would also be recommended.  We will order this at the appropriate time however.

## 2021-10-03 ENCOUNTER — Encounter: Payer: Self-pay | Admitting: *Deleted

## 2021-10-03 ENCOUNTER — Telehealth: Payer: Self-pay | Admitting: Internal Medicine

## 2021-10-03 ENCOUNTER — Other Ambulatory Visit (HOSPITAL_COMMUNITY): Payer: BC Managed Care – PPO

## 2021-10-03 NOTE — Telephone Encounter (Signed)
Scheduled appt per 12/5 staff msg from [employer]. Pt's wife is aware of appt date and time.

## 2021-10-03 NOTE — Progress Notes (Signed)
Oncology Nurse Navigator Documentation  Oncology Nurse Navigator Flowsheets 10/03/2021 08/01/2018 05/30/2018 05/05/2018 04/01/2018  Abnormal Finding Date 09/20/2021 - - - 01/01/2018  Confirmed Diagnosis Date - - - - 02/07/2018  Diagnosis Status Additional Work Up - - - -  Expected Surgery Date - 07/17/2018 - - -  Navigator Follow Up Date: 10/05/2021 - - - -  Navigator Follow Up Reason: Appointment Review - - - -  Navigator Location [facility]  Referral Date to RadOnc/MedOnc 10/03/2021 - - - -  Navigator Encounter Type Other:/I received referral today on Mr. Chris [name].  I updated new patient coordinator to call and schedule him to be seen on 12/14. Patient sees pulmonary team on 12/7 due to no tissue dx at this time.  Treatment Treatment Telephone Telephone  Telephone - - - Incoming Call Outgoing Call;Patient Update  Treatment Initiated Date - - 05/30/2018 - -  Patient Visit Type Other - - - -  Treatment Phase Abnormal Scans CT SIM;Post-Tx Follow-up CT SIM - -  Barriers/Navigation Needs Coordination of Care Education Education Education Education  Education - - Preparing for Upcoming Surgery/ Treatment Pain/ Symptom Management;Preparing for Upcoming Surgery/ Treatment Understanding Cancer/ Treatment Options  Interventions Coordination of Care Education Education Education Education  Acuity Level 2-Minimal Needs (1-2 Barriers Identified) Level 1 - - Level 2  Coordination of Care Other - - - -  Education Method - Teach-back;Verbal Teach-back;Verbal;Written Teach-back;Verbal Teach-back;Verbal  Support Groups/Services - - - - Friends and Family  Time Spent with Patient 30 15 15 15  30

## 2021-10-04 ENCOUNTER — Encounter: Payer: Self-pay | Admitting: Cardiovascular Disease

## 2021-10-04 ENCOUNTER — Ambulatory Visit: Payer: BC Managed Care – PPO | Admitting: Pulmonary Disease

## 2021-10-04 ENCOUNTER — Other Ambulatory Visit: Payer: Self-pay

## 2021-10-04 ENCOUNTER — Encounter: Payer: Self-pay | Admitting: Pulmonary Disease

## 2021-10-04 VITALS — BP 114/78 | HR 74 | Temp 97.7°F | Ht 71.0 in | Wt 249.8 lb

## 2021-10-04 DIAGNOSIS — C61 Malignant neoplasm of prostate: Secondary | ICD-10-CM

## 2021-10-04 DIAGNOSIS — Z720 Tobacco use: Secondary | ICD-10-CM | POA: Diagnosis not present

## 2021-10-04 DIAGNOSIS — R942 Abnormal results of pulmonary function studies: Secondary | ICD-10-CM

## 2021-10-04 DIAGNOSIS — R911 Solitary pulmonary nodule: Secondary | ICD-10-CM

## 2021-10-04 DIAGNOSIS — C4442 Squamous cell carcinoma of skin of scalp and neck: Secondary | ICD-10-CM | POA: Diagnosis not present

## 2021-10-04 NOTE — H&P (View-Only) (Signed)
Synopsis: Referred in December 2022 for lung nodule by Casilda Carls, MD  Subjective:   PATIENT ID: Chris [name] GENDER: male DOB: 24-Feb-1961, MRN: [id number]  Chief Complaint  Patient presents with   Consult    Patient says his biopsy showed issues with different things and he is here to talk about that..    This is a 60 year old gentleman, past medical history of prostate cancer, tobacco abuse, coronary artery disease, history of stent placement on Plavix.  Patient had a recent scalp lesion removed diagnosed with squamous cell carcinoma.  During this work-up had a CT neck soft tissue which revealed a upper lobe pulmonary nodule concerning for malignancy.  This followed by a nuclear medicine PET scan which revealed a hypermetabolic cervical node, the lesion within the lung hypermetabolic as well as a small lesion within the liver and femoral diathesis.  At this point there was concern of whether or not he potentially had metastatic lung cancer to the skin of the head as well as other locations in the body or are we dealing with multiple separate malignancies.  Discussed case with radiation oncology and are recommending tissue biopsy of the lung nodule.   Past Medical History:  Diagnosis Date   Anxiety    associated with medical care,  worsened by difficulty hearing, does better with wife present   Complication of anesthesia    wife states very anxious may need pre sedation   Coronary artery disease    GERD (gastroesophageal reflux disease)    Hyperlipidemia    MI (myocardial infarction) ([place]) 04/17/2006   Acute inferolateral wall MI with cardiogenic shock and complete heart block   Prostate cancer ([place])    Tobacco abuse    Wears glasses    Wears partial dentures    Upper     Family History  Problem Relation Age of Onset   Prostate cancer Neg Hx    Kidney cancer Neg Hx    Cancer Neg Hx      Past Surgical History:  Procedure Laterality Date   CARDIAC CATHETERIZATION   2007   left, RCA 100% occluded ruptured plaque with thrombus in the proximal segment   CYST EXCISION N/A 08/30/2021   Procedure: EXCISION OF POSTERIOR SCALP CYST;  Surgeon: Clovis Riley, MD;  Location: [facility];  Service: General;  Laterality: N/A;   CYSTOSCOPY N/A 07/17/2018   Procedure: Consuela Mimes;  Surgeon: Franchot Gallo, MD;  Location: [facility];  Service: Urology;  Laterality: N/A;  no seeds in bladder per Dr Diona Fanti   PROSTATE BIOPSY     RADIOACTIVE SEED IMPLANT N/A 07/17/2018   Procedure: RADIOACTIVE SEED IMPLANT/BRACHYTHERAPY IMPLANT;  Surgeon: Franchot Gallo, MD;  Location[facility];  Service: Urology;  Laterality: N/A;  77 seeds   SPACE OAR INSTILLATION N/A 07/17/2018   Procedure: SPACE OAR INSTILLATION;  Surgeon: Franchot Gallo, MD;  Location: [facility];  Service: Urology;  Laterality: N/A;   WRIST SURGERY  10/04/2012    Social History   Socioeconomic History   Marital status: Married    Spouse name: Not on file   Number of children: Not on file   Years of education: Not on file   Highest education level: Not on file  Occupational History   Not on file  Tobacco Use   Smoking status: Every Day    Packs/day: 1.50    Years: 42.00    Pack years: 63.00    Types: Cigarettes   Smokeless  tobacco: Never  Vaping Use   Vaping Use: Never used  Substance and Sexual Activity   Alcohol use: No   Drug use: No   Sexual activity: Yes    Birth control/protection: None  Other Topics Concern   Not on file  Social History Narrative   10-04 19 Unable to ask abuse questions wife with him today.   Social Determinants of Health   Financial Resource Strain: Not on file  Food Insecurity: Not on file  Transportation Needs: Not on file  Physical Activity: Not on file  Stress: Not on file  Social Connections: Not on file  Intimate Partner Violence: Not on file     No Known Allergies   Outpatient Medications Prior to  Visit  Medication Sig Dispense Refill   atorvastatin (LIPITOR) 20 MG tablet Take 1 tablet (20 mg total) by mouth daily. 90 tablet 3   clopidogrel (PLAVIX) 75 MG tablet Take 1 tablet (75 mg total) by mouth daily. TAKE 1 TABLET (75 MG TOTAL) BY MOUTH DAILY. 90 tablet 3   ketoconazole (NIZORAL) 2 % cream Apply 1 application topically 2 (two) times daily as needed for irritation.     metoprolol tartrate (LOPRESSOR) 25 MG tablet Take 1 tablet (25 mg total) by mouth daily. 30 tablet 11   omeprazole (PRILOSEC OTC) 20 MG tablet Take 20 mg by mouth daily.     tamsulosin (FLOMAX) 0.4 MG CAPS capsule Take 1 capsule (0.4 mg total) by mouth daily. 30 capsule 3   No facility-administered medications prior to visit.    Review of Systems  Constitutional:  Positive for malaise/fatigue. Negative for chills, fever and weight loss.  HENT:  Negative for hearing loss, sore throat and tinnitus.   Eyes:  Negative for blurred vision and double vision.  Respiratory:  Negative for cough, hemoptysis, sputum production, shortness of breath, wheezing and stridor.   Cardiovascular:  Negative for chest pain, palpitations, orthopnea, leg swelling and PND.  Gastrointestinal:  Negative for abdominal pain, constipation, diarrhea, heartburn, nausea and vomiting.  Genitourinary:  Negative for dysuria, hematuria and urgency.  Musculoskeletal:  Negative for joint pain and myalgias.  Skin:  Negative for itching and rash.  Neurological:  Negative for dizziness, tingling, weakness and headaches.  Endo/Heme/Allergies:  Negative for environmental allergies. Does not bruise/bleed easily.  Psychiatric/Behavioral:  Negative for depression. The patient is not nervous/anxious and does not have insomnia.   All other systems reviewed and are negative.   Objective:  Physical Exam Vitals reviewed.  Constitutional:      General: He is not in acute distress.    Appearance: He is well-developed. He is obese.  HENT:     Head:  Normocephalic and atraumatic.  Eyes:     General: No scleral icterus.    Conjunctiva/sclera: Conjunctivae normal.     Pupils: Pupils are equal, round, and reactive to light.  Neck:     Vascular: No JVD.     Trachea: No tracheal deviation.  Cardiovascular:     Rate and Rhythm: Normal rate and regular rhythm.     Heart sounds: Normal heart sounds. No murmur heard. Pulmonary:     Effort: Pulmonary effort is normal. No tachypnea, accessory muscle usage or respiratory distress.     Breath sounds: No stridor. No wheezing, rhonchi or rales.  Abdominal:     General: Bowel sounds are normal. There is no distension.     Palpations: Abdomen is soft.     Tenderness: There is no abdominal tenderness.  Musculoskeletal:        General: No tenderness.     Cervical back: Neck supple.  Lymphadenopathy:     Cervical: No cervical adenopathy.  Skin:    General: Skin is warm and dry.     Capillary Refill: Capillary refill takes less than 2 seconds.     Findings: No rash.  Neurological:     Mental Status: He is alert and oriented to person, place, and time.  Psychiatric:        Behavior: Behavior normal.     Vitals:   10/04/21 1606  BP: 114/78  Pulse: 74  Temp: 97.7 F (36.5 C)  TempSrc: Oral  SpO2: 98%  Weight: 249 lb 12.8 oz (113.3 kg)  Height: 5\' 11"  (1.803 m)   98% on RA BMI Readings from Last 3 Encounters:  10/04/21 34.84 kg/m  09/13/21 34.52 kg/m  08/28/21 33.61 kg/m   Wt Readings from Last 3 Encounters:  10/04/21 249 lb 12.8 oz (113.3 kg)  09/13/21 247 lb 8 oz (112.3 kg)  08/28/21 241 lb (109.3 kg)     CBC    Component Value Date/Time   WBC 7.7 08/28/2021 0855   RBC 5.54 08/28/2021 0855   HGB 16.5 08/28/2021 0855   HCT 49.3 08/28/2021 0855   PLT 240 08/28/2021 0855   MCV 89.0 08/28/2021 0855   MCH 29.8 08/28/2021 0855   MCHC 33.5 08/28/2021 0855   RDW 13.8 08/28/2021 0855   LYMPHSABS 1.2 01/05/2021 1009   MONOABS 1.2 (H) 01/05/2021 1009   EOSABS 0.0  01/05/2021 1009   BASOSABS 0.0 01/05/2021 1009    Chest Imaging: 09/28/2021 nuclear medicine pet imaging: Hypermetabolic lung lesion concerning for primary bronchogenic carcinoma, also hypermetabolic lesion within the liver femoral diaphysis and cervical node.,  Known lesion within the posterior right head. The patient's images have been independently reviewed by me.    Pulmonary Functions Testing Results: No flowsheet data found.  FeNO:   Pathology:   Echocardiogram:   Heart Catheterization:     Assessment & Plan:     ICD-10-CM   1. Lung nodule  R91.1 Ambulatory referral to Pulmonology    CT Super D Chest Wo Contrast    Procedural/ Surgical Case Request: ROBOTIC ASSISTED NAVIGATIONAL BRONCHOSCOPY    2. Squamous cell carcinoma of scalp  C44.42     3. Tobacco abuse  Z72.0     4. Malignant neoplasm of prostate ([place])  C61     5. Right upper lobe pulmonary nodule  R91.1     6. Abnormal PET of right lung  R94.2       Discussion:  This is a 60 year old gentleman longstanding history of tobacco abuse, abnormal PET scan of the lung, multiple areas of concern.  Has a right upper lobe pulmonary nodule as well as recent diagnosis of squamous cell carcinoma of the scalp.  Plan: Today in the office we had a long discussion regarding neck steps. I think the patient needs to have a lung biopsy to help determine whether or not the scalp lesion is related to the other potential distant metastatic disease. The lesion in the lung is concerning for a primary lung malignancy. Explained this today with the patient and the patient's wife. To help expedite the patient getting a biopsy sooner I would have him scheduled with my partner on Monday of next week. Patient is agreeable to this. He is on Plavix and will need to hold the Plavix starting today. This was also discussed with  the patient today in the office.  We discussed the risk benefits and alternatives of procedure to include  bleeding and pneumothorax.  Bronchoscopy to be scheduled with Dr. Lamonte Sakai on 10/09/2021    Current Outpatient Medications:    atorvastatin (LIPITOR) 20 MG tablet, Take 1 tablet (20 mg total) by mouth daily., Disp: 90 tablet, Rfl: 3   clopidogrel (PLAVIX) 75 MG tablet, Take 1 tablet (75 mg total) by mouth daily. TAKE 1 TABLET (75 MG TOTAL) BY MOUTH DAILY., Disp: 90 tablet, Rfl: 3   ketoconazole (NIZORAL) 2 % cream, Apply 1 application topically 2 (two) times daily as needed for irritation., Disp: , Rfl:    metoprolol tartrate (LOPRESSOR) 25 MG tablet, Take 1 tablet (25 mg total) by mouth daily., Disp: 30 tablet, Rfl: 11   omeprazole (PRILOSEC OTC) 20 MG tablet, Take 20 mg by mouth daily., Disp: , Rfl:    tamsulosin (FLOMAX) 0.4 MG CAPS capsule, Take 1 capsule (0.4 mg total) by mouth daily., Disp: 30 capsule, Rfl: 3  I spent 63 minutes dedicated to the care of this patient on the date of this encounter to include pre-visit review of records, face-to-face time with the patient discussing conditions above, post visit ordering of testing, clinical documentation with the electronic health record, making appropriate referrals as documented, and communicating necessary findings to members of the patients care team.   Garner Nash, DO [facility] 10/04/2021 4:22 PM

## 2021-10-04 NOTE — Progress Notes (Signed)
Synopsis: Referred in December 2022 for lung nodule by Casilda Carls, MD  Subjective:   PATIENT ID: Chris [name] GENDER: male DOB: April 11, 1961, MRN: [id number]  Chief Complaint  Patient presents with   Consult    Patient says his biopsy showed issues with different things and he is here to talk about that..    This is a 60 year old gentleman, past medical history of prostate cancer, tobacco abuse, coronary artery disease, history of stent placement on Plavix.  Patient had a recent scalp lesion removed diagnosed with squamous cell carcinoma.  During this work-up had a CT neck soft tissue which revealed a upper lobe pulmonary nodule concerning for malignancy.  This followed by a nuclear medicine PET scan which revealed a hypermetabolic cervical node, the lesion within the lung hypermetabolic as well as a small lesion within the liver and femoral diathesis.  At this point there was concern of whether or not he potentially had metastatic lung cancer to the skin of the head as well as other locations in the body or are we dealing with multiple separate malignancies.  Discussed case with radiation oncology and are recommending tissue biopsy of the lung nodule.   Past Medical History:  Diagnosis Date   Anxiety    associated with medical care,  worsened by difficulty hearing, does better with wife present   Complication of anesthesia    wife states very anxious may need pre sedation   Coronary artery disease    GERD (gastroesophageal reflux disease)    Hyperlipidemia    MI (myocardial infarction) ([place]) 04/17/2006   Acute inferolateral wall MI with cardiogenic shock and complete heart block   Prostate cancer ([place])    Tobacco abuse    Wears glasses    Wears partial dentures    Upper     Family History  Problem Relation Age of Onset   Prostate cancer Neg Hx    Kidney cancer Neg Hx    Cancer Neg Hx      Past Surgical History:  Procedure Laterality Date   CARDIAC CATHETERIZATION   2007   left, RCA 100% occluded ruptured plaque with thrombus in the proximal segment   CYST EXCISION N/A 08/30/2021   Procedure: EXCISION OF POSTERIOR SCALP CYST;  Surgeon: Clovis Riley, MD;  Location: [facility];  Service: General;  Laterality: N/A;   CYSTOSCOPY N/A 07/17/2018   Procedure: Consuela Mimes;  Surgeon: Franchot Gallo, MD;  Location[facility];  Service: Urology;  Laterality: N/A;  no seeds in bladder per Dr Diona Fanti   PROSTATE BIOPSY     RADIOACTIVE SEED IMPLANT N/A 07/17/2018   Procedure: RADIOACTIVE SEED IMPLANT/BRACHYTHERAPY IMPLANT;  Surgeon: Franchot Gallo, MD;  Location: [facility];  Service: Urology;  Laterality: N/A;  77 seeds   SPACE OAR INSTILLATION N/A 07/17/2018   Procedure: SPACE OAR INSTILLATION;  Surgeon: Franchot Gallo, MD;  Location: [facility];  Service: Urology;  Laterality: N/A;   WRIST SURGERY  10/04/2012    Social History   Socioeconomic History   Marital status: Married    Spouse name: Not on file   Number of children: Not on file   Years of education: Not on file   Highest education level: Not on file  Occupational History   Not on file  Tobacco Use   Smoking status: Every Day    Packs/day: 1.50    Years: 42.00    Pack years: 63.00    Types: Cigarettes   Smokeless  tobacco: Never  Vaping Use   Vaping Use: Never used  Substance and Sexual Activity   Alcohol use: No   Drug use: No   Sexual activity: Yes    Birth control/protection: None  Other Topics Concern   Not on file  Social History Narrative   10-04 19 Unable to ask abuse questions wife with him today.   Social Determinants of Health   Financial Resource Strain: Not on file  Food Insecurity: Not on file  Transportation Needs: Not on file  Physical Activity: Not on file  Stress: Not on file  Social Connections: Not on file  Intimate Partner Violence: Not on file     No Known Allergies   Outpatient Medications Prior to  Visit  Medication Sig Dispense Refill   atorvastatin (LIPITOR) 20 MG tablet Take 1 tablet (20 mg total) by mouth daily. 90 tablet 3   clopidogrel (PLAVIX) 75 MG tablet Take 1 tablet (75 mg total) by mouth daily. TAKE 1 TABLET (75 MG TOTAL) BY MOUTH DAILY. 90 tablet 3   ketoconazole (NIZORAL) 2 % cream Apply 1 application topically 2 (two) times daily as needed for irritation.     metoprolol tartrate (LOPRESSOR) 25 MG tablet Take 1 tablet (25 mg total) by mouth daily. 30 tablet 11   omeprazole (PRILOSEC OTC) 20 MG tablet Take 20 mg by mouth daily.     tamsulosin (FLOMAX) 0.4 MG CAPS capsule Take 1 capsule (0.4 mg total) by mouth daily. 30 capsule 3   No facility-administered medications prior to visit.    Review of Systems  Constitutional:  Positive for malaise/fatigue. Negative for chills, fever and weight loss.  HENT:  Negative for hearing loss, sore throat and tinnitus.   Eyes:  Negative for blurred vision and double vision.  Respiratory:  Negative for cough, hemoptysis, sputum production, shortness of breath, wheezing and stridor.   Cardiovascular:  Negative for chest pain, palpitations, orthopnea, leg swelling and PND.  Gastrointestinal:  Negative for abdominal pain, constipation, diarrhea, heartburn, nausea and vomiting.  Genitourinary:  Negative for dysuria, hematuria and urgency.  Musculoskeletal:  Negative for joint pain and myalgias.  Skin:  Negative for itching and rash.  Neurological:  Negative for dizziness, tingling, weakness and headaches.  Endo/Heme/Allergies:  Negative for environmental allergies. Does not bruise/bleed easily.  Psychiatric/Behavioral:  Negative for depression. The patient is not nervous/anxious and does not have insomnia.   All other systems reviewed and are negative.   Objective:  Physical Exam Vitals reviewed.  Constitutional:      General: He is not in acute distress.    Appearance: He is well-developed. He is obese.  HENT:     Head:  Normocephalic and atraumatic.  Eyes:     General: No scleral icterus.    Conjunctiva/sclera: Conjunctivae normal.     Pupils: Pupils are equal, round, and reactive to light.  Neck:     Vascular: No JVD.     Trachea: No tracheal deviation.  Cardiovascular:     Rate and Rhythm: Normal rate and regular rhythm.     Heart sounds: Normal heart sounds. No murmur heard. Pulmonary:     Effort: Pulmonary effort is normal. No tachypnea, accessory muscle usage or respiratory distress.     Breath sounds: No stridor. No wheezing, rhonchi or rales.  Abdominal:     General: Bowel sounds are normal. There is no distension.     Palpations: Abdomen is soft.     Tenderness: There is no abdominal tenderness.  Musculoskeletal:        General: No tenderness.     Cervical back: Neck supple.  Lymphadenopathy:     Cervical: No cervical adenopathy.  Skin:    General: Skin is warm and dry.     Capillary Refill: Capillary refill takes less than 2 seconds.     Findings: No rash.  Neurological:     Mental Status: He is alert and oriented to person, place, and time.  Psychiatric:        Behavior: Behavior normal.     Vitals:   10/04/21 1606  BP: 114/78  Pulse: 74  Temp: 97.7 F (36.5 C)  TempSrc: Oral  SpO2: 98%  Weight: 249 lb 12.8 oz (113.3 kg)  Height: 5\' 11"  (1.803 m)   98% on RA BMI Readings from Last 3 Encounters:  10/04/21 34.84 kg/m  09/13/21 34.52 kg/m  08/28/21 33.61 kg/m   Wt Readings from Last 3 Encounters:  10/04/21 249 lb 12.8 oz (113.3 kg)  09/13/21 247 lb 8 oz (112.3 kg)  08/28/21 241 lb (109.3 kg)     CBC    Component Value Date/Time   WBC 7.7 08/28/2021 0855   RBC 5.54 08/28/2021 0855   HGB 16.5 08/28/2021 0855   HCT 49.3 08/28/2021 0855   PLT 240 08/28/2021 0855   MCV 89.0 08/28/2021 0855   MCH 29.8 08/28/2021 0855   MCHC 33.5 08/28/2021 0855   RDW 13.8 08/28/2021 0855   LYMPHSABS 1.2 01/05/2021 1009   MONOABS 1.2 (H) 01/05/2021 1009   EOSABS 0.0  01/05/2021 1009   BASOSABS 0.0 01/05/2021 1009    Chest Imaging: 09/28/2021 nuclear medicine pet imaging: Hypermetabolic lung lesion concerning for primary bronchogenic carcinoma, also hypermetabolic lesion within the liver femoral diaphysis and cervical node.,  Known lesion within the posterior right head. The patient's images have been independently reviewed by me.    Pulmonary Functions Testing Results: No flowsheet data found.  FeNO:   Pathology:   Echocardiogram:   Heart Catheterization:     Assessment & Plan:     ICD-10-CM   1. Lung nodule  R91.1 Ambulatory referral to Pulmonology    CT Super D Chest Wo Contrast    Procedural/ Surgical Case Request: ROBOTIC ASSISTED NAVIGATIONAL BRONCHOSCOPY    2. Squamous cell carcinoma of scalp  C44.42     3. Tobacco abuse  Z72.0     4. Malignant neoplasm of prostate ([place])  C61     5. Right upper lobe pulmonary nodule  R91.1     6. Abnormal PET of right lung  R94.2       Discussion:  This is a 60 year old gentleman longstanding history of tobacco abuse, abnormal PET scan of the lung, multiple areas of concern.  Has a right upper lobe pulmonary nodule as well as recent diagnosis of squamous cell carcinoma of the scalp.  Plan: Today in the office we had a long discussion regarding neck steps. I think the patient needs to have a lung biopsy to help determine whether or not the scalp lesion is related to the other potential distant metastatic disease. The lesion in the lung is concerning for a primary lung malignancy. Explained this today with the patient and the patient's wife. To help expedite the patient getting a biopsy sooner I would have him scheduled with my partner on Monday of next week. Patient is agreeable to this. He is on Plavix and will need to hold the Plavix starting today. This was also discussed with  the patient today in the office.  We discussed the risk benefits and alternatives of procedure to include  bleeding and pneumothorax.  Bronchoscopy to be scheduled with Dr. Lamonte Sakai on 10/09/2021    Current Outpatient Medications:    atorvastatin (LIPITOR) 20 MG tablet, Take 1 tablet (20 mg total) by mouth daily., Disp: 90 tablet, Rfl: 3   clopidogrel (PLAVIX) 75 MG tablet, Take 1 tablet (75 mg total) by mouth daily. TAKE 1 TABLET (75 MG TOTAL) BY MOUTH DAILY., Disp: 90 tablet, Rfl: 3   ketoconazole (NIZORAL) 2 % cream, Apply 1 application topically 2 (two) times daily as needed for irritation., Disp: , Rfl:    metoprolol tartrate (LOPRESSOR) 25 MG tablet, Take 1 tablet (25 mg total) by mouth daily., Disp: 30 tablet, Rfl: 11   omeprazole (PRILOSEC OTC) 20 MG tablet, Take 20 mg by mouth daily., Disp: , Rfl:    tamsulosin (FLOMAX) 0.4 MG CAPS capsule, Take 1 capsule (0.4 mg total) by mouth daily., Disp: 30 capsule, Rfl: 3  I spent 63 minutes dedicated to the care of this patient on the date of this encounter to include pre-visit review of records, face-to-face time with the patient discussing conditions above, post visit ordering of testing, clinical documentation with the electronic health record, making appropriate referrals as documented, and communicating necessary findings to members of the patients care team.   Garner Nash, DO [facility] 10/04/2021 4:22 PM

## 2021-10-04 NOTE — Patient Instructions (Addendum)
Thank you for visiting Dr. Valeta Harms at [facility]. Today we recommend the following: Orders Placed This Encounter  Procedures   Procedural/ Surgical Case Request: ROBOTIC ASSISTED NAVIGATIONAL BRONCHOSCOPY   CT Super D Chest Wo Contrast   Ambulatory referral to Pulmonology   Bronchoscopy with Dr. Lamonte Sakai on Monday 10/09/2021  Return in about 2 weeks (around 10/18/2021) for w/ Dr. Lamonte Sakai or Eric Form, NP .    Please do your part to reduce the spread of COVID-19.

## 2021-10-05 ENCOUNTER — Telehealth: Payer: Self-pay | Admitting: Pulmonary Disease

## 2021-10-05 ENCOUNTER — Telehealth: Payer: Self-pay | Admitting: Emergency Medicine

## 2021-10-05 ENCOUNTER — Encounter: Payer: Self-pay | Admitting: Cardiovascular Disease

## 2021-10-05 ENCOUNTER — Ambulatory Visit (HOSPITAL_COMMUNITY)
Admission: RE | Admit: 2021-10-05 | Discharge: 2021-10-05 | Disposition: A | Discharge status: Home / Self Care | Payer: BC Managed Care – PPO | Source: Ambulatory Visit | Attending: Radiation Oncology | Admitting: Radiation Oncology

## 2021-10-05 ENCOUNTER — Telehealth: Payer: Self-pay | Admitting: *Deleted

## 2021-10-05 DIAGNOSIS — K769 Liver disease, unspecified: Secondary | ICD-10-CM | POA: Insufficient documentation

## 2021-10-05 DIAGNOSIS — C4492 Squamous cell carcinoma of skin, unspecified: Secondary | ICD-10-CM | POA: Diagnosis not present

## 2021-10-05 DIAGNOSIS — D3501 Benign neoplasm of right adrenal gland: Secondary | ICD-10-CM | POA: Diagnosis not present

## 2021-10-05 DIAGNOSIS — R911 Solitary pulmonary nodule: Secondary | ICD-10-CM | POA: Diagnosis not present

## 2021-10-05 IMAGING — MR MR ABDOMEN WO/W CM
19 series · 48 of 48 positions shown · IV contrast (gadavist)
Comparison: PET-CT scan [DATE], CT chest [DATE]

CLINICAL DATA: Squamous cell carcinoma. Liver lesion identified on
PET-CT scan. MRI recommended for further characterization.
Hypermetabolic lung nodule additionally

EXAM:
MRI ABDOMEN WITHOUT AND WITH CONTRAST
TECHNIQUE: Multiplanar multisequence MR imaging of the abdomen was performed
both before and after the administration of intravenous contrast.
CONTRAST:  10mL GADAVIST GADOBUTROL 1 MMOL/ML IV SOLN

[Series 3: T2 · coronal · 7.0mm · 1.72mm/px · 2 of 30 slices shown (1 of 3)]
[im 1/30]
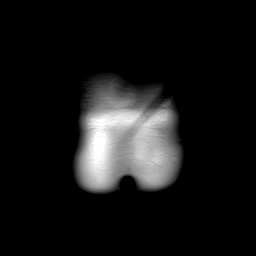
[im 30/30]
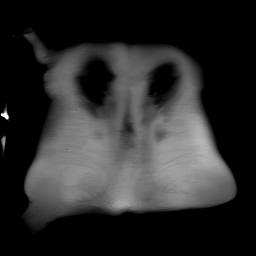

[Series 4: T2 fat-sat · axial · 6.0mm · 1.72mm/px · z∈[-245,+51]mm · 2 of 42 slices shown]
[im 1/42]
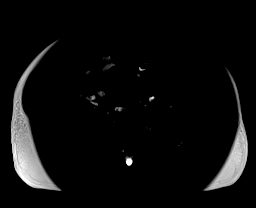
[im 42/42]
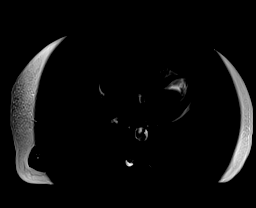

[Series 5: T1 · axial · 4.0mm · 1.38mm/px · z∈[-244,+40]mm · 3 of 72 slices shown (1 of 2)]
[im 1/72]
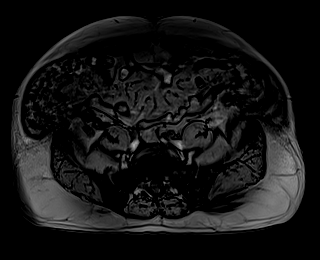
[im 36/72]
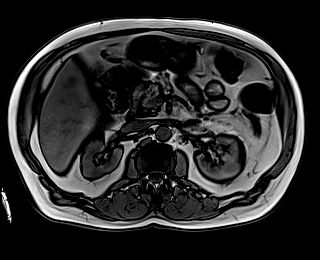
[im 72/72]
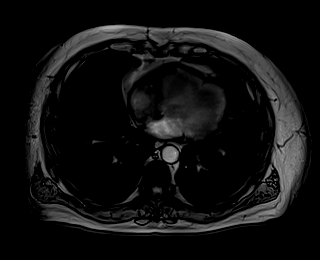

[Series 6: T1 · axial · 4.0mm · 1.38mm/px · z∈[-244,+40]mm · 3 of 72 slices shown (2 of 2)]
[im 1/72]
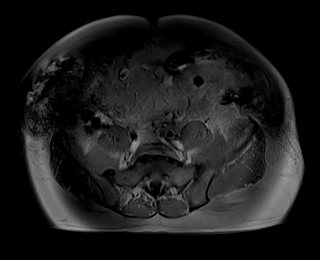
[im 36/72]
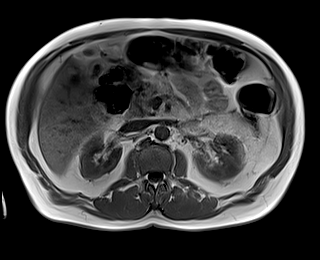
[im 72/72]
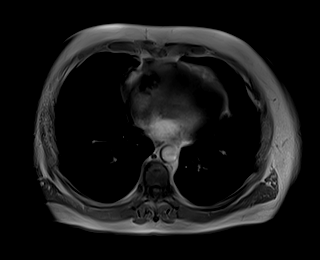

[Series 7: DWI · axial · 6.0mm · 1.64mm/px · z∈[-242,+52]mm · 3 of 72 slices shown (1 of 2)]
[im 1/72]
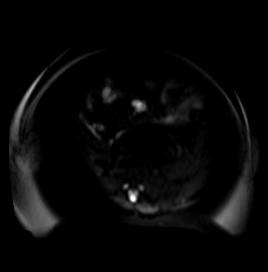
[im 36/72]
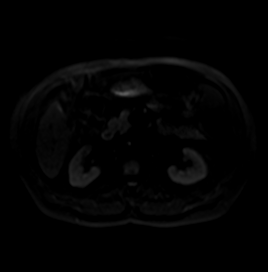
[im 72/72]
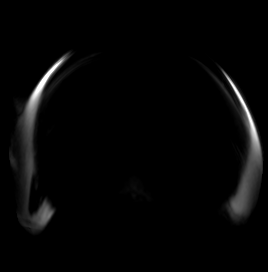

[Series 8: DWI · axial · 6.0mm · 1.64mm/px · 1 of 36 slices shown (2 of 2)]
[im 1/36]
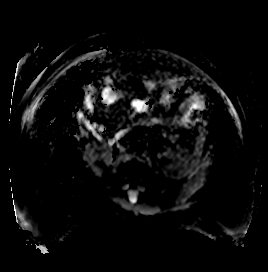

[Series 9: T2 · axial · 6.0mm · 1.72mm/px · 1 of 42 slices shown (2 of 3)]
[im 1/42]
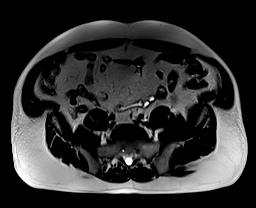

[Series 10: bSSFP · axial · 5.0mm · 0.86mm/px · z∈[-245,+63]mm · 2 of 45 slices shown]
[im 1/45]
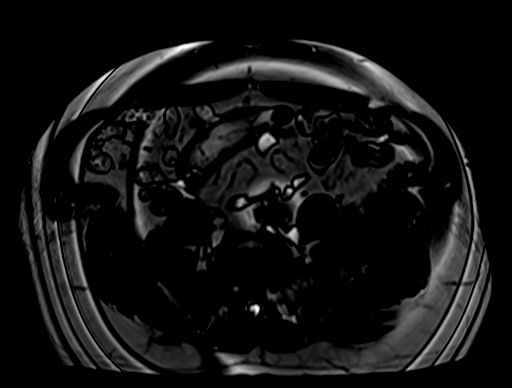
[im 45/45]
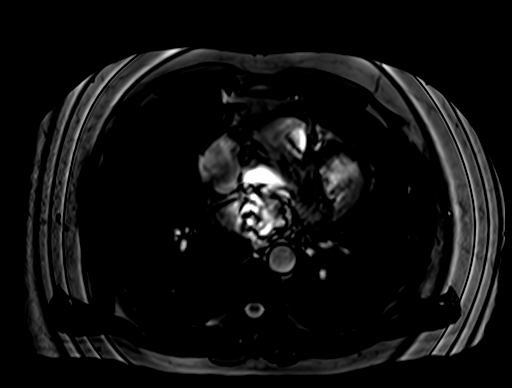

[Series 12: T1 dynamic · axial · 3.0mm · 1.38mm/px · z∈[-237,+48]mm · 3 of 96 slices shown (1 of 10)]
[im 1/96]
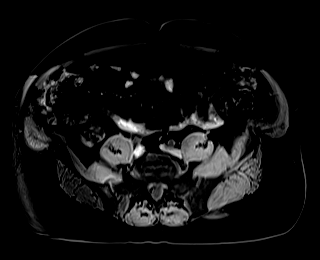
[im 48/96]
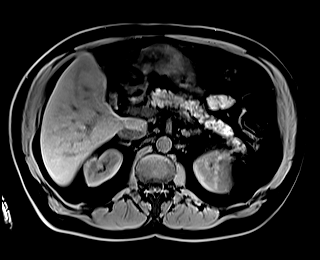
[im 96/96]
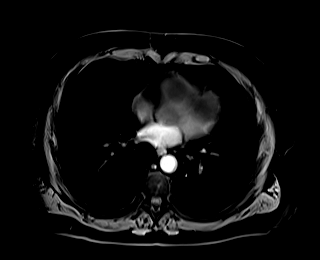

[Series 16: T1 dynamic · axial · 3.0mm · 1.38mm/px · z∈[-237,+48]mm · 3 of 96 slices shown (2 of 10)]
[im 1/96]
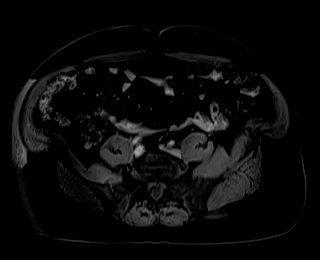
[im 48/96]
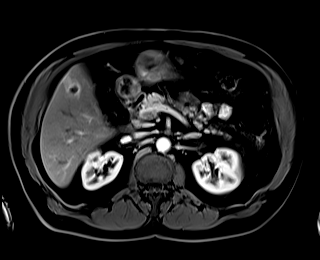
[im 96/96]
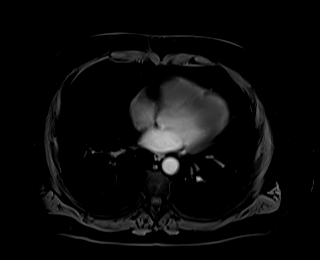

[Series 17: T1 dynamic · axial · 3.0mm · 1.38mm/px · z∈[-237,+48]mm · 3 of 96 slices shown (3 of 10)]
[im 1/96]
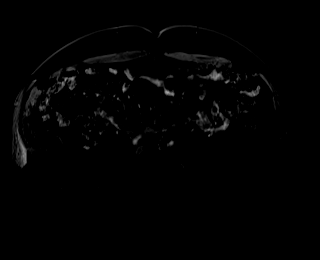
[im 48/96]
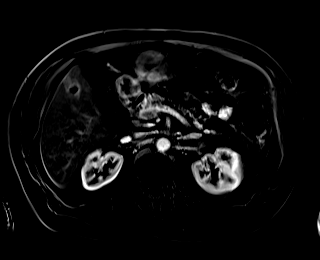
[im 96/96]
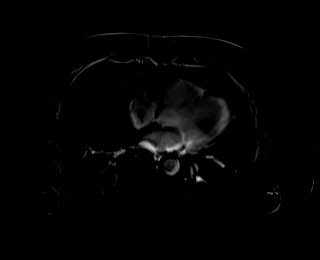

[Series 20: T1 dynamic · axial · 3.0mm · 1.38mm/px · z∈[-237,+48]mm · 3 of 96 slices shown (4 of 10)]
[im 1/96]
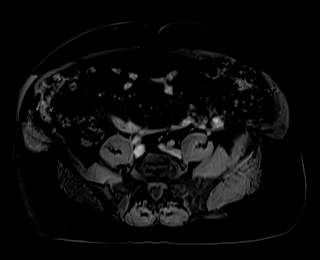
[im 48/96]
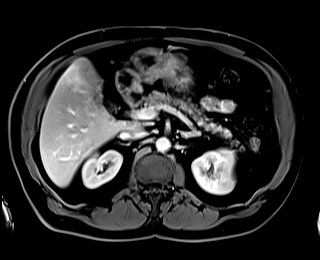
[im 96/96]
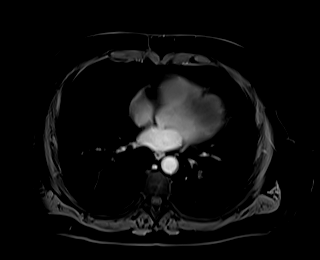

[Series 21: T1 dynamic · axial · 3.0mm · 1.38mm/px · z∈[-237,+48]mm · 3 of 96 slices shown (5 of 10)]
[im 1/96]
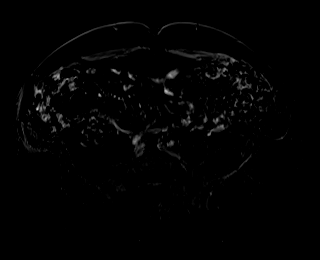
[im 48/96]
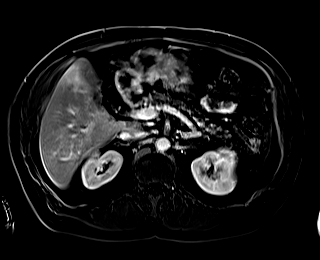
[im 96/96]
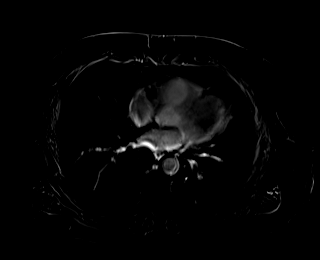

[Series 24: T1 dynamic · axial · 3.0mm · 1.38mm/px · z∈[-237,+48]mm · 3 of 96 slices shown (6 of 10)]
[im 1/96]
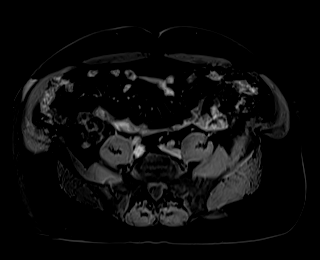
[im 48/96]
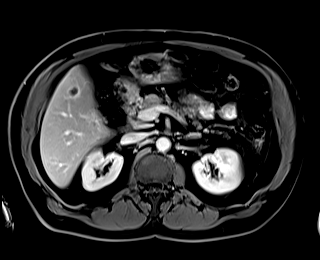
[im 96/96]
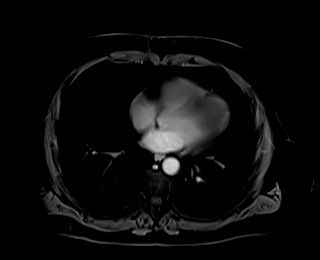

[Series 25: T1 dynamic · axial · 3.0mm · 1.38mm/px · z∈[-237,+48]mm · 3 of 96 slices shown (7 of 10)]
[im 1/96]
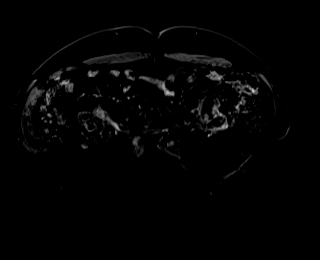
[im 48/96]
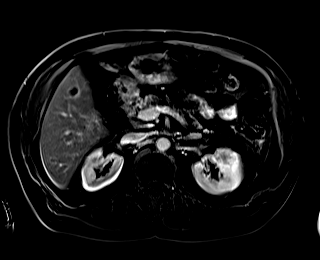
[im 96/96]
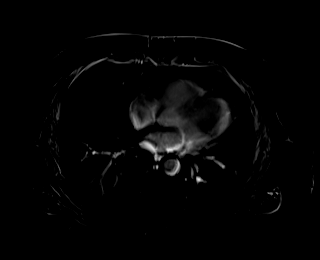

[Series 27: T1 dynamic · coronal · 3.5mm · 1.41mm/px · 3 of 80 slices shown (8 of 10)]
[im 1/80]
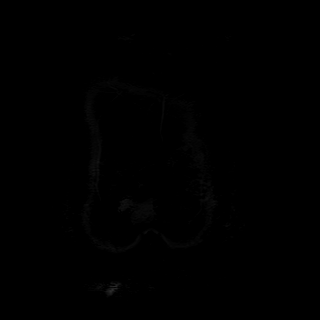
[im 40/80]
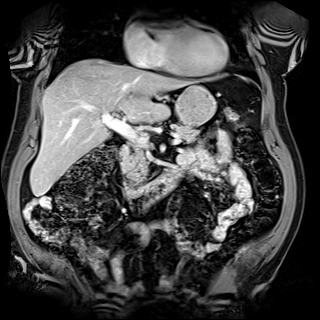
[im 80/80]
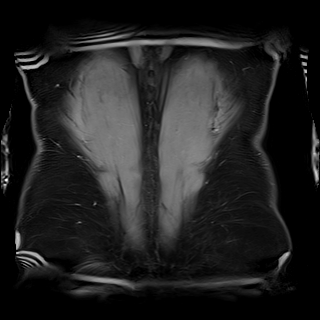

[Series 28: T2 · axial · 6.0mm · 1.72mm/px · 1 of 42 slices shown (3 of 3)]
[im 1/42]
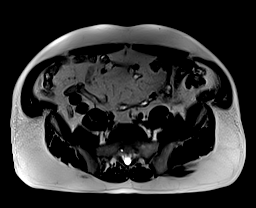

[Series 31: T1 dynamic · axial · 3.0mm · 1.38mm/px · z∈[-237,+48]mm · 3 of 96 slices shown (9 of 10)]
[im 1/96]
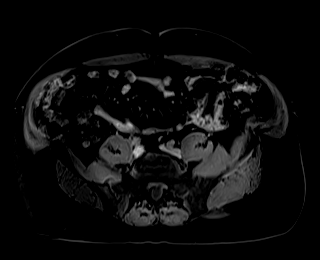
[im 48/96]
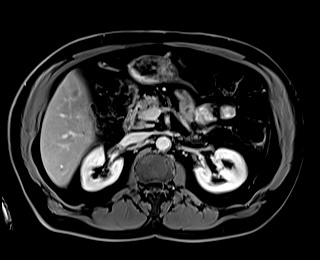
[im 96/96]
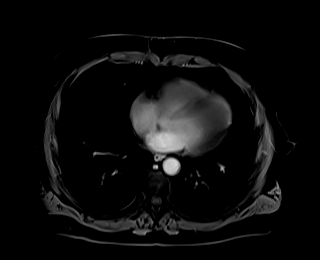

[Series 32: T1 dynamic · axial · 3.0mm · 1.38mm/px · z∈[-237,+48]mm · 3 of 96 slices shown (10 of 10)]
[im 1/96]
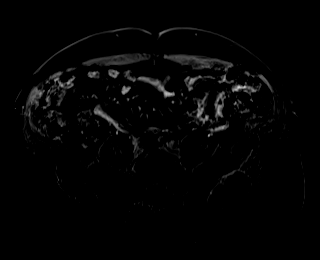
[im 48/96]
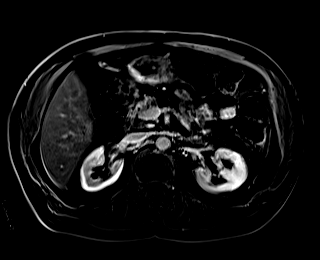
[im 96/96]
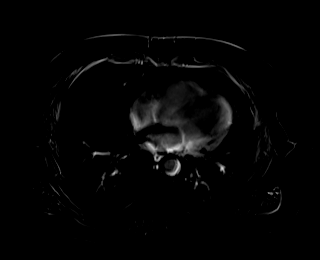

[48 of 48 positions shown; findings below may reference images not displayed]

FINDINGS: Lower chest:  Lung bases are clear.

Hepatobiliary: Within the RIGHT hepatic lobe, there is a peripheral
enhancing lesion which corresponds to the hypermetabolic lesion on
comparison FDG PET scan. Lesion measures 16 mm by 16 mm on image
49/series 16. Lesion has continuous peripheral enhancement typical
of metastatic lesion. No additional lesions are present in the
liver.

Normal gallbladder and biliary tree.

Pancreas: Normal pancreatic parenchymal intensity. No ductal
dilatation or inflammation.

Spleen: Normal spleen.

Adrenals/urinary tract: Loss of signal intensity in the adrenal
glands consistent benign bilateral adrenal adenomas.

Stomach/Bowel: thickening of the gastric mucosa to 2.8 cm (image
[DATE]). No abnormal metabolic activity on comparison FDG PET scan.

Vascular/Lymphatic: Abdominal aortic normal caliber. No
retroperitoneal periportal lymphadenopathy.

Musculoskeletal: No aggressive osseous lesion
IMPRESSION: 1. Peripheral enhancing lesion in the RIGHT hepatic lobe which
corresponds to hypermetabolic lesion on comparison FDG PET scan is
consistent with solitary hepatic metastasis.
2. Bilateral benign adrenal adenomas.

## 2021-10-05 MED ORDER — GADOBUTROL 1 MMOL/ML IV SOLN
10.0000 mL | Freq: Once | INTRAVENOUS | Status: AC | PRN
  Administered 2021-10-05: 10 mL via INTRAVENOUS

## 2021-10-05 NOTE — Telephone Encounter (Signed)
    Pre-operative Risk Assessment    Patient Name: Chris [name]  DOB: Nov 19, 1960 MRN: [id number]      Request for Surgical Clearance   Procedure:   ROBOTIC ASSISTED NAVIGATIONAL BRONCHOSCOPY  Date of Surgery: Clearance 10/09/21                                 Surgeon:  DR. June Leap Surgeon's Group or Practice Name:  [facility] Phone number:  [phone] Fax number:  [phone]   Type of Clearance Requested: - Medical  - Pharmacy:  Hold Clopidogrel (Plavix)     Type of Anesthesia:   General    Additional requests/questions:   [name]   10/05/2021, 10:31 AM

## 2021-10-05 NOTE — Telephone Encounter (Signed)
   Name: Chris [name]  DOB: 07/03/1961  MRN: [id number]   Primary Cardiologist: Quay Burow, MD  Chart reviewed as part of pre-operative protocol coverage. Patient and wife had requested cardiac clearance for procedure. Patient was contacted 10/05/2021 in reference to pre-operative risk assessment for pending surgery as outlined below.  Chris [name] was last seen by Dr. Gwenlyn Found on 12/2020 with CAD s/p inferior MI 2007 s/p DES to RCA, ICM EF 40-45%, HLD, tobacco abuse. EF improved to 50-55% by last echo 2007. Doing well at last OV. Has been maintained on long term clopidogrel monotherapy.   I reached out to patient for update on how he is doing. Wife put him on 3 way call. The patient affirms he has been doing well without any new cardiac symptoms. Therefore, based on ACC/AHA guidelines, the patient would be at acceptable risk for the planned procedure without further cardiovascular testing. The patient was advised that if he develops new symptoms prior to surgery to contact our office to arrange for a follow-up visit, and he verbalized understanding.  During unrelated clearance in 2019 Dr. Gwenlyn Found had granted clearance to hold Plavix for unrelated procedure. Since the patient has not had any pertinent interim cardiac developments that would prohibit holding Plavix, this clearance still stands. Dr. Valeta Harms gave him the recommendation to begin holding Plavix starting yesterday per office note. We typically advise that blood thinners be resumed when felt safe by performing physician.  I will route this recommendation to the requesting party via Epic fax function and remove from pre-op pool. Please call with questions.  [name], PA-C 10/05/2021, 10:52 AM

## 2021-10-05 NOTE — Telephone Encounter (Signed)
Will route to callback team for formal entry into preop pool.

## 2021-10-05 NOTE — Telephone Encounter (Signed)
Spoke with wife she states that Pulmonary is wanting to do a right lung biopsy (nodule is hypermetabolic and is concerning for primary bronchogenic carcinoma). She states that they are not requesting cardiac clearance for this biopsy. Wife and pt would like to have a review of his chart and requesting clearance to see "if it would be ok" to have this biopsy from a cardiac standpoint. She states that pt has had no chest pain or any other cardiac sx and has been "fine" with no complaints since his march appointment. I believe that she stated that the biopsy is on Monday the 19th he was told to hold Plavix starting Monday the 12th. Please advise

## 2021-10-05 NOTE — Telephone Encounter (Signed)
ADDENDUM: REQUEST TO HOLD PLAVIX x 5 DAYS;

## 2021-10-05 NOTE — Telephone Encounter (Signed)
Pt has been scheduled for 12/12 at 11:00.  CT could not be scheduled at same time as MRI this evening but he is scheduled for in the morning at [facility]  They will send disc to [facility].  Pt will go for covid test tomorrow.  Gave appt info to pt's wife.

## 2021-10-06 ENCOUNTER — Other Ambulatory Visit: Payer: Self-pay | Admitting: Pulmonary Disease

## 2021-10-06 ENCOUNTER — Encounter (HOSPITAL_COMMUNITY): Payer: Self-pay | Admitting: Emergency Medicine

## 2021-10-06 ENCOUNTER — Telehealth: Payer: Self-pay

## 2021-10-06 ENCOUNTER — Other Ambulatory Visit: Payer: Self-pay

## 2021-10-06 ENCOUNTER — Ambulatory Visit (INDEPENDENT_AMBULATORY_CARE_PROVIDER_SITE_OTHER)
Admission: RE | Admit: 2021-10-06 | Discharge: 2021-10-06 | Disposition: A | Discharge status: Home / Self Care | Payer: BC Managed Care – PPO | Source: Ambulatory Visit | Attending: Pulmonary Disease | Admitting: Pulmonary Disease

## 2021-10-06 DIAGNOSIS — R918 Other nonspecific abnormal finding of lung field: Secondary | ICD-10-CM | POA: Diagnosis not present

## 2021-10-06 DIAGNOSIS — R911 Solitary pulmonary nodule: Secondary | ICD-10-CM | POA: Diagnosis not present

## 2021-10-06 IMAGING — CT CT CHEST SUPER D W/O CM
2 of 5 series · 15 of 36 positions shown, 18 images · non-contrast
Comparison: PET-CT scan [DATE]

CLINICAL DATA: Lung mass.  Squamous cell carcinoma.

EXAM:
CT CHEST WITHOUT CONTRAST
TECHNIQUE: Multidetector CT imaging of the chest was performed using thin slice
collimation for electromagnetic bronchoscopy planning purposes,
without intravenous contrast.

[Series 4: thins · axial · 0.83mm/px · z∈[-340,-48]mm · 12 of 423 slices shown, 15 images]
[im 29/423  mediastinal]
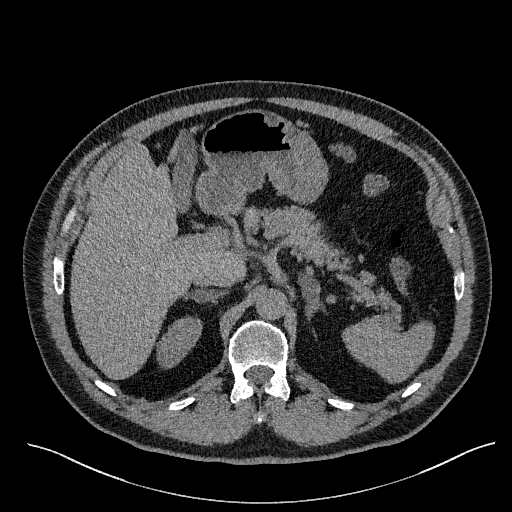
[im 29/423  lung]
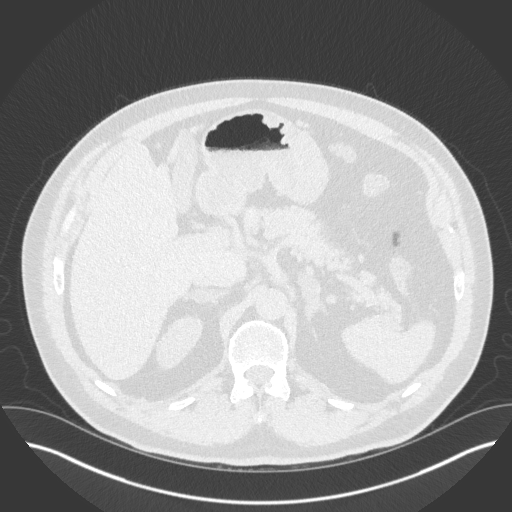
[im 57/423  lung]
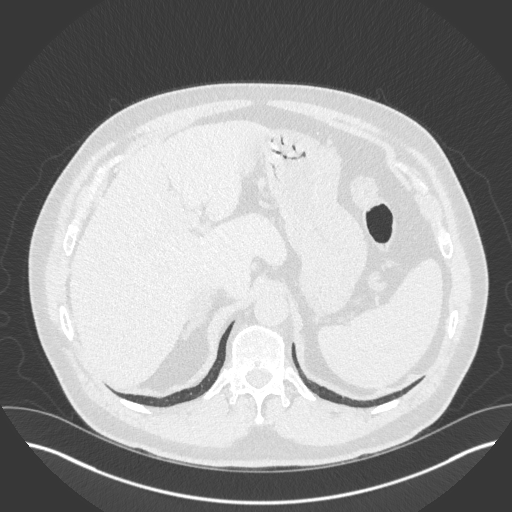
[im 85/423  lung]
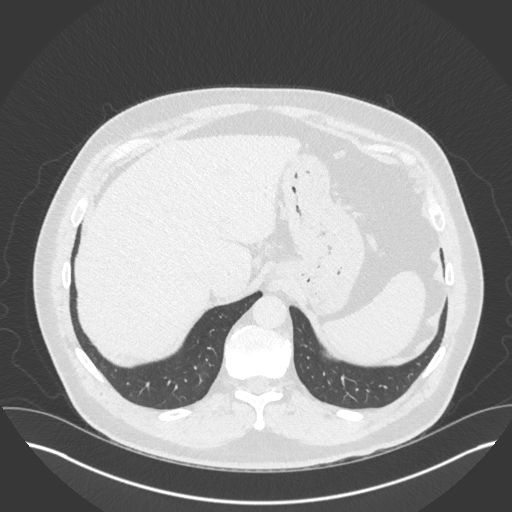
[im 141/423  lung]
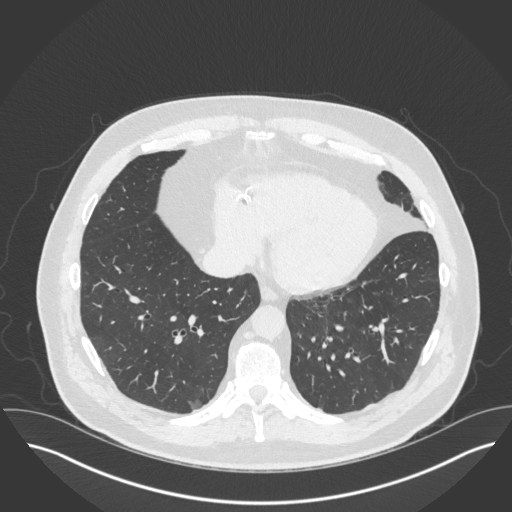
[im 169/423  mediastinal]
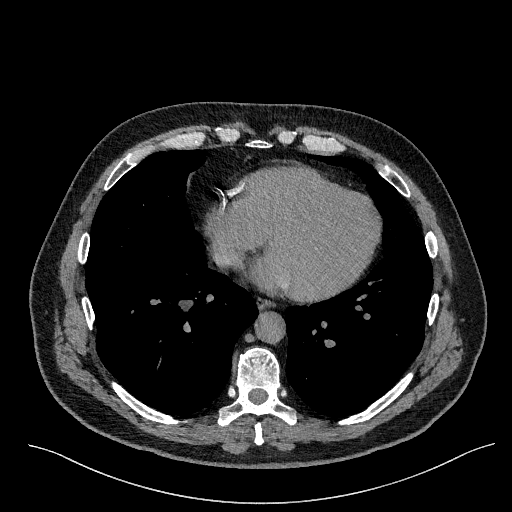
[im 169/423  lung]
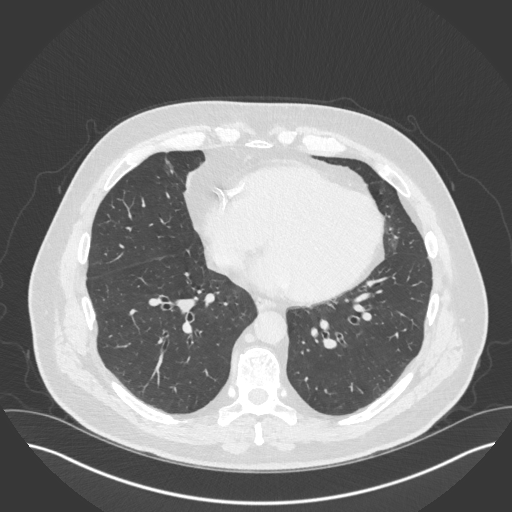
[im 197/423  lung]
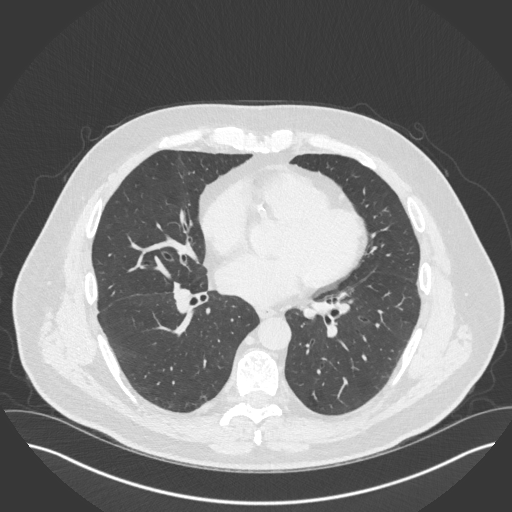
[im 226/423  lung]
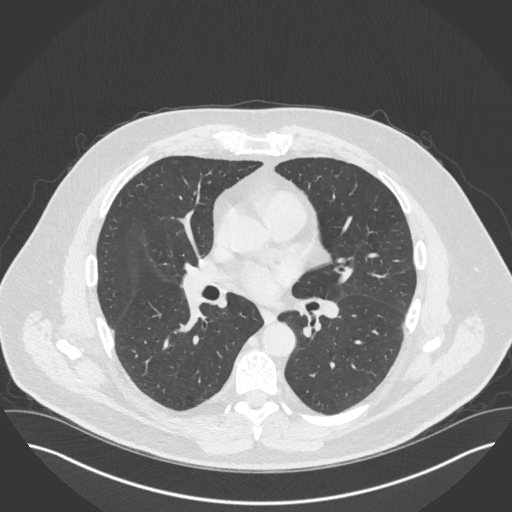
[im 254/423  lung]
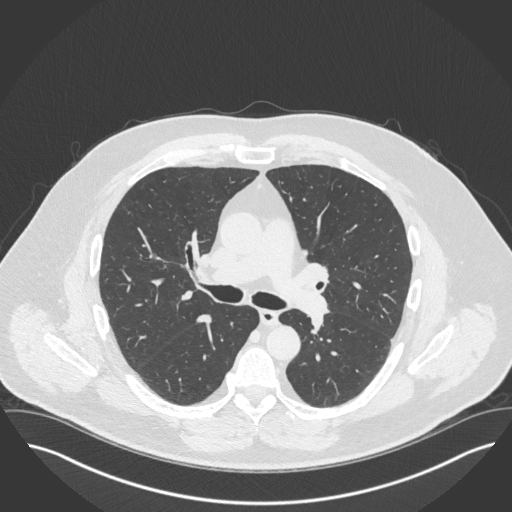
[im 282/423  mediastinal]
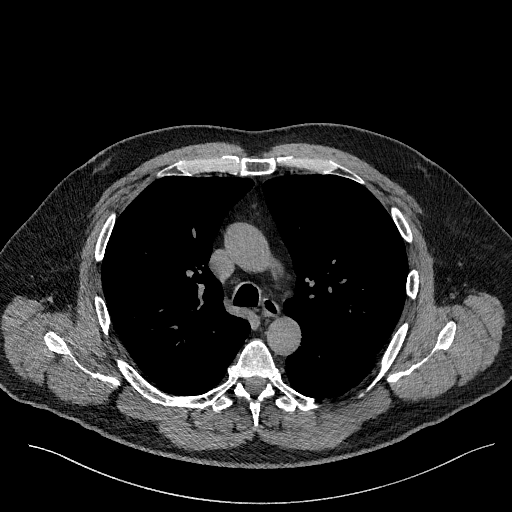
[im 282/423  lung]
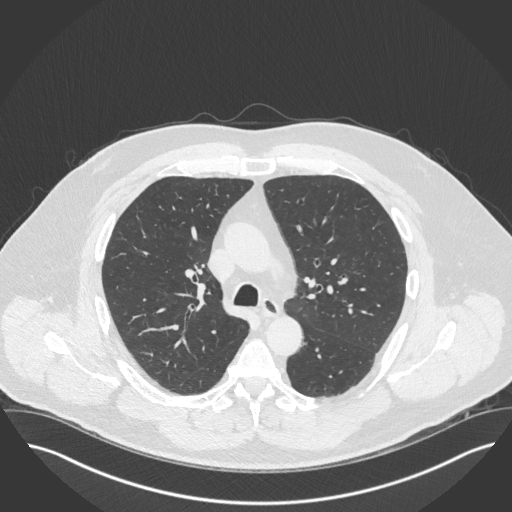
[im 338/423  lung]
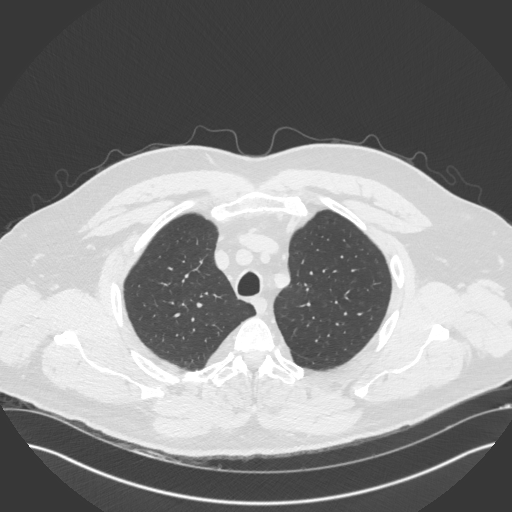
[im 366/423  lung]
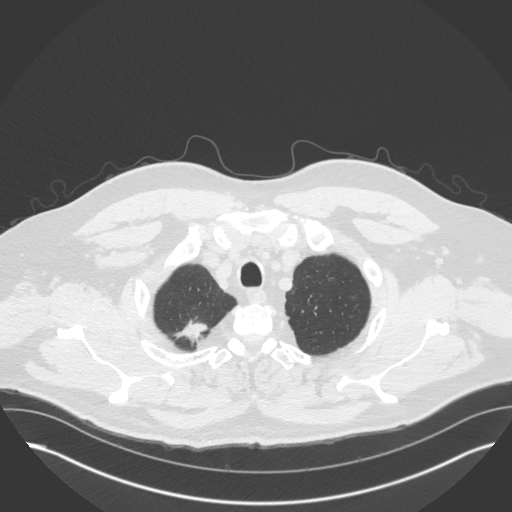
[im 394/423  lung]
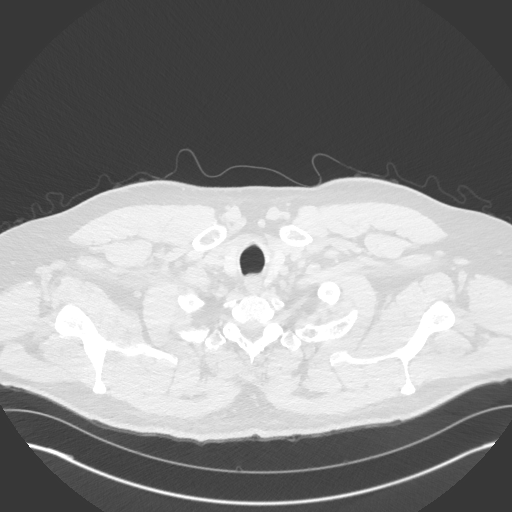

[Series 5: coronal · coronal · 0.66mm/px · 3 of 96 slices shown]
[im 20/96  lung]
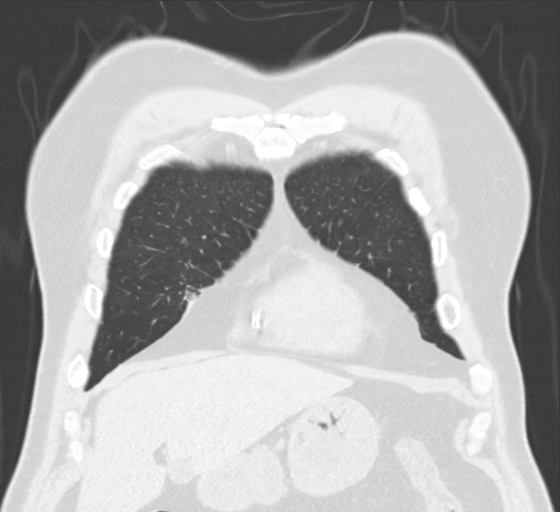
[im 39/96  lung]
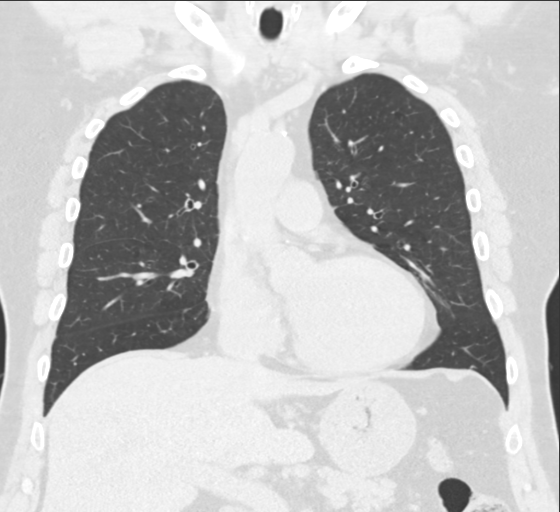
[im 58/96  lung]
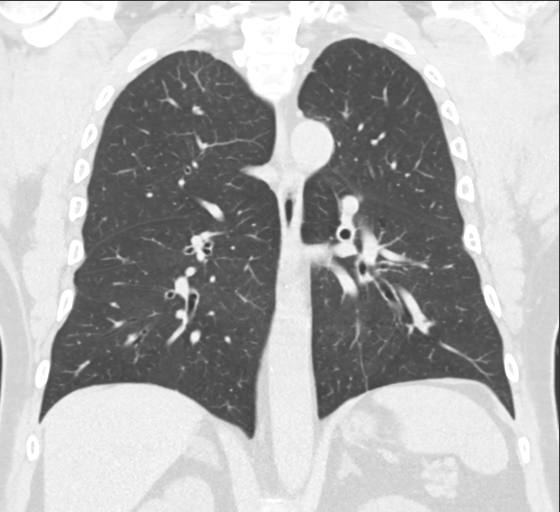

[15 of 36 positions shown; findings below may reference images not displayed]

FINDINGS: Cardiovascular: Coronary artery calcification and aortic
atherosclerotic calcification.

Mediastinum/Nodes: No axillary or supraclavicular adenopathy. No
mediastinal or hilar adenopathy. No pericardial fluid. Esophagus
normal.

Lungs/Pleura: Spiculated nodule in the RIGHT lung apex measures
x 1.5 cm (image 24/CT series 3). This nodule is hypermetabolic on
comparison FDG PET scan.

No additional pulmonary nodules identified.

Upper Abdomen: Low-attenuation lesion in the RIGHT hepatic lobe
corresponds to hypermetabolic lesion on comparison FDG PET scan.

Low-attenuation of the adrenal glands consistent with adenomas or
hyperplasia.

Musculoskeletal: No aggressive osseous lesion.
IMPRESSION: 1. Spiculated mass in the RIGHT upper lobe remains concerning for
malignancy.
2. Low-density lesion in the RIGHT hepatic lobe corresponds to
hypermetabolic lesion on comparison PET-CT scan and concerning for
metastatic liver lesion.
3. Adrenal hyperplasia versus adenomas.

## 2021-10-06 NOTE — Progress Notes (Signed)
Anesthesia Chart Review: SAME DAY WORK-UP  Case: [id number] Date/Time: 10/09/21 1100   Procedure: ROBOTIC ASSISTED NAVIGATIONAL BRONCHOSCOPY (Bilateral) - ION w/ CIOS   Anesthesia type: General   Diagnosis: Lung nodule [R91.1]   Pre-op diagnosis: lung nodule   Location: [facility] / [facility]   Surgeons: Collene Gobble, MD       DISCUSSION: Patient is a 60 year old male scheduled for the above procedure. He is a smoker with prostate cancer in 2019 and poorly differentiated SCC of scalp lesion with + margin on 08/30/21. During work-up for SCC found to have a hypermetabolic spiculated RUL lung nodule as well as hypermetabolic lesion right occipital ln, right liver and proximal right femoral diaphysis. He was referred to pulmonologist June Leap, DO. Above procedure recommended for tissue diagnosis of lung lesion.  Other history includes smoking, CAD (inferior MI with cardiogenic shock and CHB, s/p DES RCA 04/17/06), HLD, GERD, prostate cancer (s/p I-125 seed implant 07/17/18), anxiety. S/p excision of posterior scalp mass 08/30/21 (pathology: poorly differentiated SCC, + margin).  Preoperative cardiology input outlined by Melina Copa, PA-C on 10/05/21, "I reached out to patient for update on how he is doing. Wife put him on 3 way call. The patient affirms he has been doing well without any new cardiac symptoms. Therefore, based on ACC/AHA guidelines, the patient would be at acceptable risk for the planned procedure without further cardiovascular testing. The patient was advised that if he develops new symptoms prior to surgery to contact our office to arrange for a follow-up visit, and he verbalized understanding.   During unrelated clearance in 2019 Dr. Gwenlyn Found had granted clearance to hold Plavix for unrelated procedure. Since the patient has not had any pertinent interim cardiac developments that would prohibit holding Plavix, this clearance still stands. Dr. Valeta Harms gave him the  recommendation to begin holding Plavix starting yesterday per office note. We typically advise that blood thinners be resumed when felt safe by performing physician."  Anesthesia team to evaluate on the day of surgery.   VS:  BP Readings from Last 3 Encounters:  10/04/21 114/78  09/13/21 (!) 140/98  08/30/21 (!) 143/87   Pulse Readings from Last 3 Encounters:  10/04/21 74  09/13/21 69  08/30/21 (!) 58     PROVIDERS: Casilda Carls, MD is PCP  Quay Burow, MD is Cardiologist  Kyung Rudd, MD is RAD-ONC   LABS: For day of surgery as indicated. As of 08/28/21, CBC normal., BUN 15, Cr .090, glucose 110.     IMAGES: CT Super D Chest 10/06/21: IMPRESSION: 1. Spiculated mass in the RIGHT upper lobe remains concerning for malignancy. 2. Low-density lesion in the RIGHT hepatic lobe corresponds to hypermetabolic lesion on comparison PET-CT scan and concerning for metastatic liver lesion. 3. Adrenal hyperplasia versus adenomas.   MRI Liver 10/05/21: IMPRESSION: 1. Peripheral enhancing lesion in the RIGHT hepatic lobe which corresponds to hypermetabolic lesion on comparison FDG PET scan is consistent with solitary hepatic metastasis. 2. Bilateral benign adrenal adenomas.   PET Scan 09/28/21: IMPRESSION: 1. The spiculated nodule at the right lung apex on recent neck CT is hypermetabolic and is concerning for primary bronchogenic carcinoma. Tissue sampling recommended. 2. Hypermetabolic activity within the occipital scalp and small previously demonstrated right occipital lymph node compatible with known squamous cell carcinoma. Evaluation of the head and neck limited by motion artifact. 3. Hypermetabolic lesions inferiorly in the right hepatic lobe and in the proximal right femoral diaphysis suspicious for metastatic  disease, primary uncertain in this patient with a history of prostate cancer. Correlate with PSA levels. Abdominal MRI without and with contrast may be helpful  for further characterization of the liver lesion.   CT Soft tissue neck 09/20/21: IMPRESSION: 1. 7.5 mm posterior lymph node on the right which is indeterminate but could be due to metastatic disease. Consider biopsy. No enlarged lymph nodes in the left posterior neck or scalp. 2. 17 mm spiculated mass right upper lobe, suspicious for carcinoma lung. Recommend CT chest with contrast for further evaluation.   EKG: 01/13/2021 Rate 87 bpm  Sinus rhythm with occasional premature ventricular complexes Incomplete RBBB Cannot rule out inferior infarct, age undetermined    CV: Stress Test 04/19/2011 No scintigraphic evidence of inducible myocardial ischemia.  Normal Myocardial Perfusion Study. This is a low risk scan.    Echo 06/14/2006 Left ventricular systolic function is low normal. EF 50-55%. There is borderline concentric left ventricular hypertrophy There is borderline inferior wall hyokinesis There is trace mitral regurgitation There is mild tricuspid regurgitation.   Cardiac cath 04/17/2006 ANGIOGRAPHIC RESULTS: 1.  Left main normal. 2.  LAD normal. 3.  Left circumflex nondominant, normal. 4.  Right coronary artery was large, dominant and a 100% occluded with what appeared to be a ruptured plaque with thrombus in the proximal segment. 5.  Left ventriculography:The left ventricular ejection fraction estimated between 40-45% with moderate to severe inferior/inferobasal hypokinesia. IMPRESSION: Successful direct percutaneous coronary intervention and stenting using drug-eluting stent in the setting of acute inferior wall month infarction complicated by cardiogenic shock and complete heart block   Past Medical History:  Diagnosis Date   Anxiety    associated with medical care,  worsened by difficulty hearing, does better with wife present   Complication of anesthesia    wife states very anxious may need pre sedation   Coronary artery disease    GERD (gastroesophageal reflux  disease)    Hyperlipidemia    MI (myocardial infarction) ([place]) 04/17/2006   Acute inferolateral wall MI with cardiogenic shock and complete heart block   Prostate cancer ([place])    Tobacco abuse    Wears glasses    Wears partial dentures    Upper    Past Surgical History:  Procedure Laterality Date   CARDIAC CATHETERIZATION  2007   left, RCA 100% occluded ruptured plaque with thrombus in the proximal segment   CYST EXCISION N/A 08/30/2021   Procedure: EXCISION OF POSTERIOR SCALP CYST;  Surgeon: Clovis Riley, MD;  Location: [facility];  Service: General;  Laterality: N/A;   CYSTOSCOPY N/A 07/17/2018   Procedure: Consuela Mimes;  Surgeon: Franchot Gallo, MD;  Location: [facility];  Service: Urology;  Laterality: N/A;  no seeds in bladder per Dr Diona Fanti   PROSTATE BIOPSY     RADIOACTIVE SEED IMPLANT N/A 07/17/2018   Procedure: RADIOACTIVE SEED IMPLANT/BRACHYTHERAPY IMPLANT;  Surgeon: Franchot Gallo, MD;  Location: [facility];  Service: Urology;  Laterality: N/A;  77 seeds   SPACE OAR INSTILLATION N/A 07/17/2018   Procedure: SPACE OAR INSTILLATION;  Surgeon: Franchot Gallo, MD;  Location: [facility];  Service: Urology;  Laterality: N/A;   WRIST SURGERY  10/04/2012    MEDICATIONS: No current facility-administered medications for this encounter.    atorvastatin (LIPITOR) 20 MG tablet   clopidogrel (PLAVIX) 75 MG tablet   ketoconazole (NIZORAL) 2 % cream   metoprolol tartrate (LOPRESSOR) 25 MG tablet   omeprazole (PRILOSEC OTC) 20 MG tablet  tamsulosin (FLOMAX) 0.4 MG CAPS capsule    Myra Gianotti, PA-C Surgical Short Stay/[facility] Phone [phone] [facility] Phone [phone] 10/06/2021 10:58 AM

## 2021-10-06 NOTE — Anesthesia Preprocedure Evaluation (Addendum)
Anesthesia Evaluation  Patient identified by MRN, date of birth, ID band Patient awake    Reviewed: Allergy & Precautions, NPO status , Patient's Chart, lab work & pertinent test results  History of Anesthesia Complications (+) history of anesthetic complications (anxious)  Airway Mallampati: III  TM Distance: >3 FB Neck ROM: Full    Dental  (+) Dental Advisory Given, Edentulous Upper   Pulmonary Current Smoker and Patient abstained from smoking.,  Current smoker, 63 pack year history, still smoking about 1 ppd   Pulmonary exam normal breath sounds clear to auscultation       Cardiovascular hypertension (154/88 in preop), + CAD, + Past MI (2007) and + Cardiac Stents (DES to RCA 2007)  Normal cardiovascular exam Rhythm:Regular Rate:Normal     Neuro/Psych PSYCHIATRIC DISORDERS Anxiety negative neurological ROS     GI/Hepatic Neg liver ROS, GERD  Medicated and Controlled,  Endo/Other  negative endocrine ROS  Renal/GU negative Renal ROS  negative genitourinary   Musculoskeletal negative musculoskeletal ROS (+)   Abdominal (+) + obese,   Peds  Hematology negative hematology ROS (+)   Anesthesia Other Findings HOH  plavix   Reproductive/Obstetrics negative OB ROS                           Anesthesia Physical Anesthesia Plan  ASA: 3  Anesthesia Plan: General   Post-op Pain Management: Tylenol PO (pre-op)   Induction: Intravenous  PONV Risk Score and Plan: 1 and Ondansetron, Dexamethasone, Midazolam and Treatment may vary due to age or medical condition  Airway Management Planned: Oral ETT  Additional Equipment: None  Intra-op Plan:   Post-operative Plan: Extubation in OR  Informed Consent: I have reviewed the patients History and Physical, chart, labs and discussed the procedure including the risks, benefits and alternatives for the proposed anesthesia with the patient or  authorized representative who has indicated his/her understanding and acceptance.     Dental advisory given  Plan Discussed with: CRNA  Anesthesia Plan Comments: ( )      Anesthesia Quick Evaluation

## 2021-10-06 NOTE — Progress Notes (Signed)
DUE TO COVID-19 ONLY ONE VISITOR IS ALLOWED TO COME WITH YOU AND STAY IN THE WAITING ROOM ONLY DURING PRE OP AND PROCEDURE DAY OF SURGERY.   PCP - Dr Casilda Carls Cardiologist - Dr Quay Burow  CT Chest x-ray - 10/06/21 EKG - 01/13/21 Stress Test - 04/19/11 ECHO - 06/14/06 Cardiac Cath - 04/17/06  ICD Pacemaker/Loop - n/a  Blood Thinner Instructions:  Follow your surgeon's instructions on when to stop Plavix prior to surgery.  Anesthesia review: Yes  STOP now taking any Aspirin (unless otherwise instructed by your surgeon), Aleve, Naproxen, Ibuprofen, Motrin, Advil, Goody's, BC's, all herbal medications, fish oil, and all vitamins.   Coronavirus Screening Covid test on was 10/06/21. Do you have any of the following symptoms:  Cough yes/no: No Fever (>100.44F)  yes/no: No Runny nose yes/no: No Sore throat yes/no: No Difficulty breathing/shortness of breath  yes/no: No  Have you traveled in the last 14 days and where? yes/no: No  Patient verbalized understanding of instructions that were given via phone. Wife request to be present in SS due to patient's hearing loss (no hearing aids).

## 2021-10-06 NOTE — Telephone Encounter (Signed)
Will forward to [facility] to follow up on surgiery clearance for pt.  thanks

## 2021-10-06 NOTE — Telephone Encounter (Signed)
Spoke w/ patient's spouse Chris [name] who's cleared to speak on patient's behalf and identified using 2 identifiers. She would like a call as soon as possible from Shona Simpson PA-C, regarding the results of Chris [name] most recent MRI and CT results. I told her that Shona Simpson PA-C is not on shift today but that I would do my best to get the message to her.

## 2021-10-09 ENCOUNTER — Ambulatory Visit (HOSPITAL_COMMUNITY): Payer: BC Managed Care – PPO | Admitting: Vascular Surgery

## 2021-10-09 ENCOUNTER — Encounter (HOSPITAL_COMMUNITY): Payer: Self-pay | Admitting: Emergency Medicine

## 2021-10-09 ENCOUNTER — Ambulatory Visit (HOSPITAL_COMMUNITY): Payer: BC Managed Care – PPO

## 2021-10-09 ENCOUNTER — Encounter (HOSPITAL_COMMUNITY)
Admission: RE | Disposition: A | Discharge status: Home / Self Care | Payer: Self-pay | Source: Home / Self Care | Attending: Emergency Medicine

## 2021-10-09 ENCOUNTER — Encounter (HOSPITAL_COMMUNITY): Payer: Self-pay | Admitting: Radiology

## 2021-10-09 ENCOUNTER — Other Ambulatory Visit: Payer: Self-pay

## 2021-10-09 ENCOUNTER — Other Ambulatory Visit: Payer: Self-pay | Admitting: Radiation Oncology

## 2021-10-09 ENCOUNTER — Ambulatory Visit (HOSPITAL_COMMUNITY)
Admission: RE | Admit: 2021-10-09 | Discharge: 2021-10-09 | Disposition: A | Discharge status: Home / Self Care | Payer: BC Managed Care – PPO | Attending: Emergency Medicine | Admitting: Emergency Medicine

## 2021-10-09 DIAGNOSIS — Z7902 Long term (current) use of antithrombotics/antiplatelets: Secondary | ICD-10-CM | POA: Diagnosis not present

## 2021-10-09 DIAGNOSIS — C4442 Squamous cell carcinoma of skin of scalp and neck: Secondary | ICD-10-CM | POA: Insufficient documentation

## 2021-10-09 DIAGNOSIS — J9811 Atelectasis: Secondary | ICD-10-CM | POA: Diagnosis not present

## 2021-10-09 DIAGNOSIS — F17210 Nicotine dependence, cigarettes, uncomplicated: Secondary | ICD-10-CM | POA: Insufficient documentation

## 2021-10-09 DIAGNOSIS — C3411 Malignant neoplasm of upper lobe, right bronchus or lung: Secondary | ICD-10-CM | POA: Diagnosis not present

## 2021-10-09 DIAGNOSIS — I2510 Atherosclerotic heart disease of native coronary artery without angina pectoris: Secondary | ICD-10-CM | POA: Diagnosis not present

## 2021-10-09 DIAGNOSIS — I10 Essential (primary) hypertension: Secondary | ICD-10-CM | POA: Diagnosis not present

## 2021-10-09 DIAGNOSIS — Z955 Presence of coronary angioplasty implant and graft: Secondary | ICD-10-CM | POA: Insufficient documentation

## 2021-10-09 DIAGNOSIS — C61 Malignant neoplasm of prostate: Secondary | ICD-10-CM | POA: Insufficient documentation

## 2021-10-09 DIAGNOSIS — I517 Cardiomegaly: Secondary | ICD-10-CM | POA: Diagnosis not present

## 2021-10-09 DIAGNOSIS — R911 Solitary pulmonary nodule: Secondary | ICD-10-CM | POA: Diagnosis not present

## 2021-10-09 DIAGNOSIS — R942 Abnormal results of pulmonary function studies: Secondary | ICD-10-CM | POA: Diagnosis not present

## 2021-10-09 DIAGNOSIS — Z9889 Other specified postprocedural states: Secondary | ICD-10-CM

## 2021-10-09 DIAGNOSIS — Z419 Encounter for procedure for purposes other than remedying health state, unspecified: Secondary | ICD-10-CM

## 2021-10-09 DIAGNOSIS — K769 Liver disease, unspecified: Secondary | ICD-10-CM

## 2021-10-09 HISTORY — DX: Essential (primary) hypertension: I10

## 2021-10-09 HISTORY — PX: BRONCHIAL NEEDLE ASPIRATION BIOPSY: SHX5106

## 2021-10-09 HISTORY — PX: BRONCHIAL BIOPSY: SHX5109

## 2021-10-09 HISTORY — PX: BRONCHIAL BRUSHINGS: SHX5108

## 2021-10-09 HISTORY — DX: Unspecified hearing loss, unspecified ear: H91.90

## 2021-10-09 HISTORY — PX: VIDEO BRONCHOSCOPY WITH RADIAL ENDOBRONCHIAL ULTRASOUND: SHX6849

## 2021-10-09 HISTORY — PX: FIDUCIAL MARKER PLACEMENT: SHX6858

## 2021-10-09 LAB — CBC
HCT: 51.7 % (ref 39.0–52.0)
Hemoglobin: 16.8 g/dL (ref 13.0–17.0)
MCH: 29.5 pg (ref 26.0–34.0)
MCHC: 32.5 g/dL (ref 30.0–36.0)
MCV: 90.7 fL (ref 80.0–100.0)
Platelets: 221 10*3/uL (ref 150–400)
RBC: 5.7 MIL/uL (ref 4.22–5.81)
RDW: 13.6 % (ref 11.5–15.5)
WBC: 7.3 10*3/uL (ref 4.0–10.5)
nRBC: 0 % (ref 0.0–0.2)

## 2021-10-09 LAB — BASIC METABOLIC PANEL
Anion gap: 12 (ref 5–15)
BUN: 19 mg/dL (ref 6–20)
CO2: 16 mmol/L — ABNORMAL LOW (ref 22–32)
Calcium: 8.7 mg/dL — ABNORMAL LOW (ref 8.9–10.3)
Chloride: 109 mmol/L (ref 98–111)
Creatinine, Ser: 0.96 mg/dL (ref 0.61–1.24)
GFR, Estimated: >60 mL/min (ref >60)
Glucose, Bld: 94 mg/dL (ref 70–99)
Potassium: 4.3 mmol/L (ref 3.5–5.1)
Sodium: 137 mmol/L (ref 135–145)

## 2021-10-09 LAB — SARS CORONAVIRUS 2 (TAT 6-24 HRS): SARS Coronavirus 2: NEGATIVE

## 2021-10-09 IMAGING — DX DG CHEST 1V PORT
1 series · 1 of 1 positions shown · non-contrast
Comparison: [DATE] and CT chest [DATE].

CLINICAL DATA: Post bronchoscopy.

EXAM:
PORTABLE CHEST 1 VIEW

[chest ap]
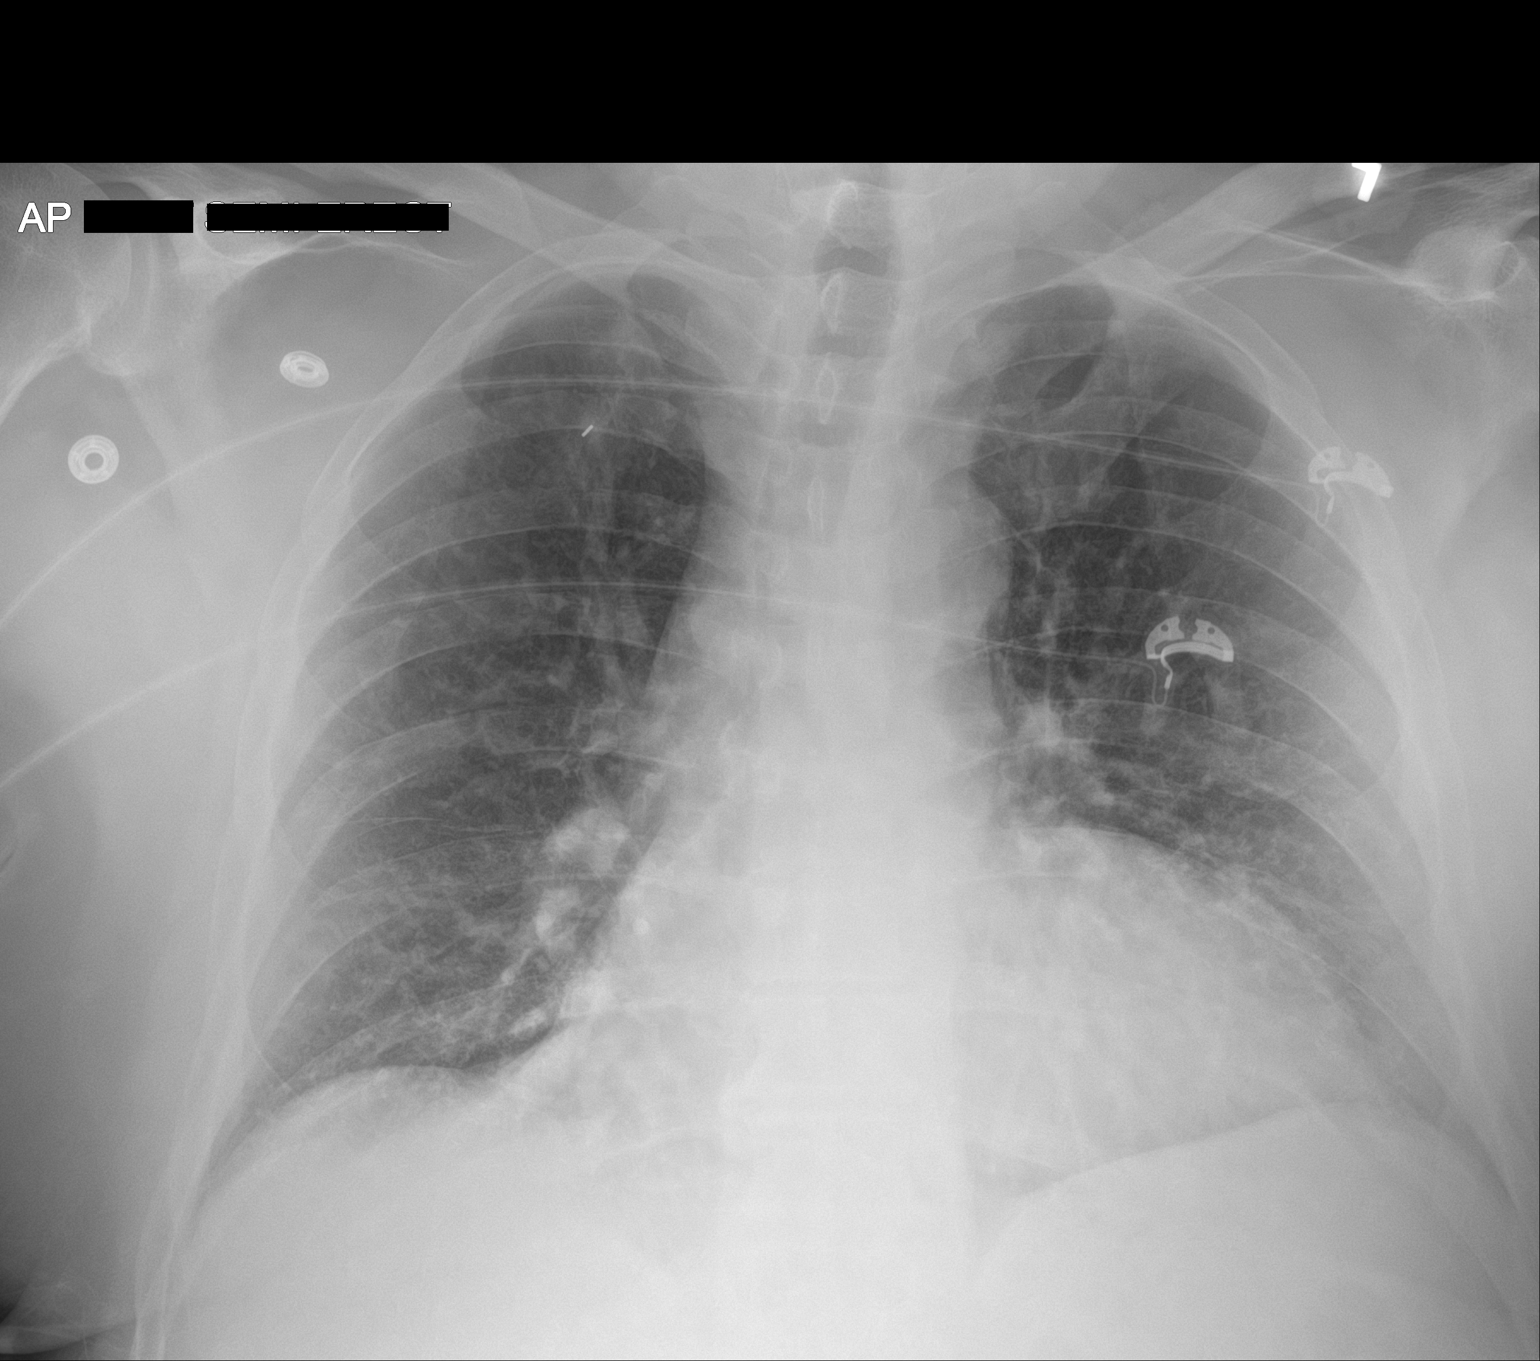

[1 of 1 positions shown; findings below may reference images not displayed]

FINDINGS: Trachea is midline. Heart is enlarged. A fiducial marker is seen at
the site of a known right upper lobe nodule. No definite
pneumothorax status post bronchoscopic biopsy. There may be streaky
atelectasis in both lung bases. No airspace consolidation. No
pleural fluid.
IMPRESSION: 1. Fiducial marker localizes a known right upper lobe nodule. No
definite pneumothorax after bronchoscopic biopsy.
2. Bibasilar streaky atelectasis.

## 2021-10-09 SURGERY — BRONCHOSCOPY, WITH BIOPSY USING ELECTROMAGNETIC NAVIGATION
Anesthesia: General | Laterality: Bilateral

## 2021-10-09 MED ORDER — PROPOFOL 10 MG/ML IV BOLUS
INTRAVENOUS | Status: DC | PRN
  Administered 2021-10-09: 150 mg via INTRAVENOUS

## 2021-10-09 MED ORDER — PHENYLEPHRINE HCL-NACL 20-0.9 MG/250ML-% IV SOLN
INTRAVENOUS | Status: DC | PRN
  Administered 2021-10-09: 45 ug/min via INTRAVENOUS

## 2021-10-09 MED ORDER — LACTATED RINGERS IV SOLN: INTRAVENOUS | Status: DC

## 2021-10-09 MED ORDER — SUGAMMADEX SODIUM 200 MG/2ML IV SOLN
INTRAVENOUS | Status: DC | PRN
  Administered 2021-10-09: 200 mg via INTRAVENOUS

## 2021-10-09 MED ORDER — PHENYLEPHRINE 40 MCG/ML (10ML) SYRINGE FOR IV PUSH (FOR BLOOD PRESSURE SUPPORT)
PREFILLED_SYRINGE | INTRAVENOUS | Status: DC | PRN
  Administered 2021-10-09 (×3): 80 ug via INTRAVENOUS

## 2021-10-09 MED ORDER — ONDANSETRON HCL 4 MG/2ML IJ SOLN: 4.0000 mg | Freq: Once | INTRAMUSCULAR | Status: DC | PRN

## 2021-10-09 MED ORDER — LIDOCAINE 2% (20 MG/ML) 5 ML SYRINGE
INTRAMUSCULAR | Status: DC | PRN
  Administered 2021-10-09: 60 mg via INTRAVENOUS

## 2021-10-09 MED ORDER — OXYCODONE HCL 5 MG PO TABS: 5.0000 mg | ORAL_TABLET | Freq: Once | ORAL | Status: DC | PRN

## 2021-10-09 MED ORDER — ONDANSETRON HCL 4 MG/2ML IJ SOLN
INTRAMUSCULAR | Status: DC | PRN
  Administered 2021-10-09: 4 mg via INTRAVENOUS

## 2021-10-09 MED ORDER — CLOPIDOGREL BISULFATE 75 MG PO TABS: 75.0000 mg | ORAL_TABLET | Freq: Every day | ORAL | 3 refills | Status: DC

## 2021-10-09 MED ORDER — DEXAMETHASONE SODIUM PHOSPHATE 10 MG/ML IJ SOLN
INTRAMUSCULAR | Status: DC | PRN
  Administered 2021-10-09: 10 mg via INTRAVENOUS

## 2021-10-09 MED ORDER — FENTANYL CITRATE (PF) 100 MCG/2ML IJ SOLN: 25.0000 ug | INTRAMUSCULAR | Status: DC | PRN

## 2021-10-09 MED ORDER — FENTANYL CITRATE (PF) 250 MCG/5ML IJ SOLN
INTRAMUSCULAR | Status: DC | PRN
  Administered 2021-10-09: 100 ug via INTRAVENOUS

## 2021-10-09 MED ORDER — ACETAMINOPHEN 500 MG PO TABS
1000.0000 mg | ORAL_TABLET | Freq: Once | ORAL | Status: AC
  Administered 2021-10-09: 1000 mg via ORAL
  Filled 2021-10-09: qty 2

## 2021-10-09 MED ORDER — ROCURONIUM BROMIDE 10 MG/ML (PF) SYRINGE
PREFILLED_SYRINGE | INTRAVENOUS | Status: DC | PRN
  Administered 2021-10-09: 100 mg via INTRAVENOUS

## 2021-10-09 MED ORDER — OXYCODONE HCL 5 MG/5ML PO SOLN: 5.0000 mg | Freq: Once | ORAL | Status: DC | PRN

## 2021-10-09 MED ORDER — EPHEDRINE SULFATE 50 MG/ML IJ SOLN
INTRAMUSCULAR | Status: DC | PRN
  Administered 2021-10-09 (×2): 5 mg via INTRAVENOUS

## 2021-10-09 MED ORDER — CHLORHEXIDINE GLUCONATE 0.12 % MT SOLN
OROMUCOSAL | Status: AC
  Administered 2021-10-09: 15 mL
  Filled 2021-10-09: qty 15

## 2021-10-09 SURGICAL SUPPLY — 1 items: covidien superlock fiducial marker ×2 IMPLANT

## 2021-10-09 NOTE — Transfer of Care (Signed)
Immediate Anesthesia Transfer of Care Note  Patient: Chris [name]  Procedure(s) Performed: ROBOTIC ASSISTED NAVIGATIONAL BRONCHOSCOPY (Bilateral) VIDEO BRONCHOSCOPY WITH RADIAL ENDOBRONCHIAL ULTRASOUND BRONCHIAL NEEDLE ASPIRATION BIOPSIES BRONCHIAL BRUSHINGS BRONCHIAL BIOPSIES FIDUCIAL MARKER PLACEMENT  Patient Location: PACU  Anesthesia Type:General  Level of Consciousness: awake, alert  and oriented  Airway & Oxygen Therapy: Patient Spontanous Breathing  Post-op Assessment: Post -op Vital signs reviewed and stable  Post vital signs: stable  Last Vitals:  Vitals Value Taken Time  BP    Temp    Pulse 89 10/09/21 1239  Resp 15 10/09/21 1239  SpO2 97 % 10/09/21 1239  Vitals shown include unvalidated device data.  Last Pain:  Vitals:   10/09/21 0915  TempSrc:   PainSc: 0-No pain         Complications: No notable events documented.

## 2021-10-09 NOTE — Anesthesia Postprocedure Evaluation (Signed)
Anesthesia Post Note  Patient: Chris [name]  Procedure(s) Performed: ROBOTIC ASSISTED NAVIGATIONAL BRONCHOSCOPY (Bilateral) VIDEO BRONCHOSCOPY WITH RADIAL ENDOBRONCHIAL ULTRASOUND BRONCHIAL NEEDLE ASPIRATION BIOPSIES BRONCHIAL BRUSHINGS BRONCHIAL BIOPSIES FIDUCIAL MARKER PLACEMENT     Patient location during evaluation: PACU Anesthesia Type: General Level of consciousness: awake and alert, oriented and patient cooperative Pain management: pain level controlled Vital Signs Assessment: post-procedure vital signs reviewed and stable Respiratory status: spontaneous breathing, nonlabored ventilation and respiratory function stable Cardiovascular status: blood pressure returned to baseline and stable Postop Assessment: no apparent nausea or vomiting Anesthetic complications: no   No notable events documented.  Last Vitals:  Vitals:   10/09/21 1255 10/09/21 1304  BP: (!) 135/92 120/78  Pulse: 87 84  Resp: 20 15  Temp:  36.6 C  SpO2: 93% 92%    Last Pain:  Vitals:   10/09/21 1304  TempSrc:   PainSc: 0-No pain                 Pervis Hocking

## 2021-10-09 NOTE — Op Note (Signed)
Video Bronchoscopy with Robotic Assisted Bronchoscopic Navigation   Date of Operation: 10/09/2021   Pre-op Diagnosis: Right upper lobe pulmonary nodule   Post-op Diagnosis: Same  Surgeon: Baltazar Apo  Assistants: None  Anesthesia: General endotracheal anesthesia  Operation: Flexible video fiberoptic bronchoscopy with robotic assistance and biopsies.  Estimated Blood Loss: Minimal  Complications: None  Indications and History: Chris [name] is a 60 y.o. male with history of tobacco use.  Recently diagnosed with a squamous cell cancer of the scalp. The risks, benefits, complications, treatment options and expected outcomes were discussed with the patient.  The possibilities of pneumothorax, pneumonia, reaction to medication, pulmonary aspiration, perforation of a viscus, bleeding, failure to diagnose a condition and creating a complication requiring transfusion or operation were discussed with the patient who freely signed the consent.    Description of Procedure: The patient was seen in the Preoperative Area, was examined and was deemed appropriate to proceed.  The patient was taken to [facility] endoscopy room 3, identified as [name] and the procedure verified as Flexible Video Fiberoptic Bronchoscopy.  A Time Out was held and the above information confirmed.   Prior to the date of the procedure a high-resolution CT scan of the chest was performed. Utilizing ION software program a virtual tracheobronchial tree was generated to allow the creation of distinct navigation pathways to the patient's parenchymal abnormalities. After being taken to the operating room general anesthesia was initiated and the patient  was orally intubated. The video fiberoptic bronchoscope was introduced via the endotracheal tube and a general inspection was performed which showed normal right and left lung anatomy, aspiration of the bilateral mainstems was completed to remove any remaining secretions. Robotic  catheter inserted into patient's endotracheal tube.   Target #1 right upper lobe pulmonary nodule: The distinct navigation pathways prepared prior to this procedure were then utilized to navigate to patient's lesion identified on CT scan. The robotic catheter was secured into place and the vision probe was withdrawn.  Lesion location was approximated using fluoroscopy and radial endobronchial ultrasound for peripheral targeting.  Local registration and targeting was then performed using Cios three-dimensional imaging.  Under fluoroscopic guidance transbronchial needle brushings, transbronchial needle biopsies, and transbronchial forceps biopsies were performed to be sent for cytology and pathology.  A single fiducial marker was placed in the airway adjacent to the pulmonary nodule under fluoroscopic guidance.   At the end of the procedure a general airway inspection was performed and there was no evidence of active bleeding. The bronchoscope was removed.  The patient tolerated the procedure well. There was no significant blood loss and there were no obvious complications. A post-procedural chest x-ray is pending.  Samples Target #1: 1. Transbronchial needle brushings from right upper lobe pulmonary nodule 2. Transbronchial Wang needle biopsies from right upper lobe pulmonary nodule 3. Transbronchial forceps biopsies from right upper lobe pulmonary nodule  Plans:  The patient will be discharged from the PACU to home when recovered from anesthesia and after chest x-ray is reviewed. We will review the cytology, pathology results with the patient when they become available. Outpatient followup will be with Dr. Lamonte Sakai.    Baltazar Apo, MD, PhD 10/09/2021, 12:36 PM [facility] [phone] or if no answer before 7:00PM call [phone] For any issues after 7:00PM please call eLink [phone]

## 2021-10-09 NOTE — Anesthesia Procedure Notes (Signed)
Procedure Name: Intubation Date/Time: 10/09/2021 11:09 AM Performed by: Lavell Luster, CRNA Pre-anesthesia Checklist: Patient identified, Emergency Drugs available, Suction available and Patient being monitored Patient Re-evaluated:Patient Re-evaluated prior to induction Oxygen Delivery Method: Circle System Utilized Preoxygenation: Pre-oxygenation with 100% oxygen Induction Type: IV induction Ventilation: Mask ventilation without difficulty and Oral airway inserted - appropriate to patient size Laryngoscope Size: Sabra Heck and 3 Grade View: Grade I Tube type: Oral Tube size: 8.5 mm Number of attempts: 1 Airway Equipment and Method: Stylet and Oral airway Placement Confirmation: ETT inserted through vocal cords under direct vision, positive ETCO2 and breath sounds checked- equal and bilateral Secured at: 22 cm Tube secured with: Tape Dental Injury: Teeth and Oropharynx as per pre-operative assessment

## 2021-10-09 NOTE — Progress Notes (Signed)
Patient Name  Chris [name], Chris [name] Legal Sex  Male DOB  August 10, 1961 SSN  [id number] Address  [address]  [place] [address] Phone  [phone] (Home) *[phone] (Mobile)    RE: US BIOPSY (LIVER) Received: Today Lorretta Harp, MD  Garth Bigness D OK to hold plavix.   Thx, JJB        Previous Messages   ----- Message -----  From: Garth Bigness D  Sent: 10/09/2021  10:16 AM EST  To: Hayden Pedro, PA-C, *  Subject: FW: US BIOPSY (LIVER)                           Good morning Dr Gwenlyn Found, there is an order placed for [name] to have a Liver Biopsy done. I noticed that patient is on Plavix under your care. Please advise if okay to hold for 5 days prior to Biopsy. Thanks Aniceto Boss  ----- Message -----  From: Garth Bigness D  Sent: 10/09/2021  10:14 AM EST  To: Ir Procedure Requests  Subject: US BIOPSY (LIVER)                               Procedure:  US BIOPSY (LIVER)   Reason:  Right upper lobe pulmonary nodule,  Squamous cell carcinoma of scalp, Liver disease, liver metastasis on MRI likely lung primary   History:     CT, MR, NM PET in computer   Provider:  Hayden Pedro   Provider Contact:  [phone]

## 2021-10-09 NOTE — Discharge Instructions (Signed)
Flexible Bronchoscopy, Care After This sheet gives you information about how to care for yourself after your test. Your doctor may also give you more specific instructions. If you have problems or questions, contact your doctor. Follow these instructions at home: Eating and drinking Do not eat or drink anything (not even water) for 2 hours after your test, or until your numbing medicine (local anesthetic) wears off. When your numbness is gone and your cough and gag reflexes have come back, you may: Eat only soft foods. Slowly drink liquids. The day after the test, go back to your normal diet. Driving Do not drive for 24 hours if you were given a medicine to help you relax (sedative). Do not drive or use heavy machinery while taking prescription pain medicine. General instructions  Take over-the-counter and prescription medicines only as told by your doctor. Return to your normal activities as told. Ask what activities are safe for you. Do not use any products that have nicotine or tobacco in them. This includes cigarettes and e-cigarettes. If you need help quitting, ask your doctor. Keep all follow-up visits as told by your doctor. This is important. It is very important if you had a tissue sample (biopsy) taken. Get help right away if: You have shortness of breath that gets worse. You get light-headed. You feel like you are going to pass out (faint). You have chest pain. You cough up: More than a little blood. More blood than before. Summary Do not eat or drink anything (not even water) for 2 hours after your test, or until your numbing medicine wears off. Do not use cigarettes. Do not use e-cigarettes. Get help right away if you have chest pain.  Please call our office for any questions or concerns.  [phone].  This information is not intended to replace advice given to you by your health care provider. Make sure you discuss any questions you have with your health care  provider. Document Released: 08/12/2009 Document Revised: 09/27/2017 Document Reviewed: 11/02/2016 Elsevier Patient Education  2020 [employer].

## 2021-10-09 NOTE — Interval H&P Note (Signed)
History and Physical Interval Note:  10/09/2021 10:51 AM  Chris [name]  has presented today for surgery, with the diagnosis of lung nodule.  The various methods of treatment have been discussed with the patient and family. After consideration of risks, benefits and other options for treatment, the patient has consented to  Procedure(s) with comments: ROBOTIC ASSISTED NAVIGATIONAL BRONCHOSCOPY (Bilateral) - ION w/ CIOS as a surgical intervention.  The patient's history has been reviewed, patient examined, no change in status, stable for surgery.  I have reviewed the patient's chart and labs.  Questions were answered to the patient's satisfaction.     Collene Gobble

## 2021-10-09 NOTE — Progress Notes (Signed)
Patient Name  Loudenslager, Haim H Legal Sex  Male DOB  08/07/1961 SSN  [id number] Address  [address]  [place] [address] Phone  [phone] (Home) *Preferred*  [phone] (Mobile)    RE: US BIOPSY (LIVER) Received: Today Hassell, Daniel, MD  Simpson, Nitasha D Ok   US core liver met   DDH        Previous Messages   ----- Message -----  From: Simpson, Nitasha D  Sent: 10/09/2021  10:14 AM EST  To: Ir Procedure Requests  Subject: US BIOPSY (LIVER)                               Procedure:  US BIOPSY (LIVER)   Reason:  Right upper lobe pulmonary nodule,  Squamous cell carcinoma of scalp, Liver disease, liver metastasis on MRI likely lung primary   History:     CT, MR, NM PET in computer   Provider:  PERKINS, ALISON CLAIRE   Provider Contact:  [phone]  

## 2021-10-10 ENCOUNTER — Encounter: Payer: Self-pay | Admitting: Pulmonary Disease

## 2021-10-11 ENCOUNTER — Inpatient Hospital Stay: Payer: BC Managed Care – PPO

## 2021-10-11 ENCOUNTER — Other Ambulatory Visit: Payer: Self-pay

## 2021-10-11 ENCOUNTER — Telehealth: Payer: Self-pay

## 2021-10-11 ENCOUNTER — Other Ambulatory Visit: Payer: Self-pay | Admitting: Cardiovascular Disease

## 2021-10-11 ENCOUNTER — Other Ambulatory Visit: Payer: Self-pay | Admitting: Internal Medicine

## 2021-10-11 ENCOUNTER — Encounter: Payer: Self-pay | Admitting: Internal Medicine

## 2021-10-11 ENCOUNTER — Telehealth: Payer: Self-pay | Admitting: Radiation Oncology

## 2021-10-11 ENCOUNTER — Inpatient Hospital Stay: Payer: BC Managed Care – PPO | Attending: Internal Medicine | Admitting: Internal Medicine

## 2021-10-11 ENCOUNTER — Telehealth: Payer: Self-pay | Admitting: *Deleted

## 2021-10-11 VITALS — BP 157/74 | HR 73 | Temp 96.6°F | Resp 19 | Ht 71.0 in | Wt 250.5 lb

## 2021-10-11 DIAGNOSIS — C61 Malignant neoplasm of prostate: Secondary | ICD-10-CM

## 2021-10-11 DIAGNOSIS — C7951 Secondary malignant neoplasm of bone: Secondary | ICD-10-CM | POA: Insufficient documentation

## 2021-10-11 DIAGNOSIS — Z8546 Personal history of malignant neoplasm of prostate: Secondary | ICD-10-CM | POA: Diagnosis not present

## 2021-10-11 DIAGNOSIS — Z79899 Other long term (current) drug therapy: Secondary | ICD-10-CM | POA: Diagnosis not present

## 2021-10-11 DIAGNOSIS — C3411 Malignant neoplasm of upper lobe, right bronchus or lung: Secondary | ICD-10-CM | POA: Diagnosis not present

## 2021-10-11 DIAGNOSIS — C3490 Malignant neoplasm of unspecified part of unspecified bronchus or lung: Secondary | ICD-10-CM

## 2021-10-11 DIAGNOSIS — E538 Deficiency of other specified B group vitamins: Secondary | ICD-10-CM | POA: Diagnosis not present

## 2021-10-11 LAB — CMP (CANCER CENTER ONLY)
ALT: 26 U/L (ref 0–44)
AST: 18 U/L (ref 15–41)
Albumin: 4.2 g/dL (ref 3.5–5.0)
Alkaline Phosphatase: 75 U/L (ref 38–126)
Anion gap: 10 (ref 5–15)
BUN: 17 mg/dL (ref 6–20)
CO2: 25 mmol/L (ref 22–32)
Calcium: 8.8 mg/dL — ABNORMAL LOW (ref 8.9–10.3)
Chloride: 107 mmol/L (ref 98–111)
Creatinine: 1.07 mg/dL (ref 0.61–1.24)
GFR, Estimated: >60 mL/min (ref >60)
Glucose, Bld: 89 mg/dL (ref 70–99)
Potassium: 3.9 mmol/L (ref 3.5–5.1)
Sodium: 142 mmol/L (ref 135–145)
Total Bilirubin: 0.6 mg/dL (ref 0.3–1.2)
Total Protein: 7.2 g/dL (ref 6.5–8.1)

## 2021-10-11 LAB — CBC WITH DIFFERENTIAL (CANCER CENTER ONLY)
Abs Immature Granulocytes: 0.02 10*3/uL (ref 0.00–0.07)
Basophils Absolute: 0.1 10*3/uL (ref 0.0–0.1)
Basophils Relative: 1 %
Eosinophils Absolute: 0.1 10*3/uL (ref 0.0–0.5)
Eosinophils Relative: 1 %
HCT: 47.5 % (ref 39.0–52.0)
Hemoglobin: 16.3 g/dL (ref 13.0–17.0)
Immature Granulocytes: 0 %
Lymphocytes Relative: 31 %
Lymphs Abs: 2.5 10*3/uL (ref 0.7–4.0)
MCH: 30 pg (ref 26.0–34.0)
MCHC: 34.3 g/dL (ref 30.0–36.0)
MCV: 87.3 fL (ref 80.0–100.0)
Monocytes Absolute: 1 10*3/uL (ref 0.1–1.0)
Monocytes Relative: 12 %
Neutro Abs: 4.3 10*3/uL (ref 1.7–7.7)
Neutrophils Relative %: 55 %
Platelet Count: 229 10*3/uL (ref 150–400)
RBC: 5.44 MIL/uL (ref 4.22–5.81)
RDW: 13.9 % (ref 11.5–15.5)
WBC Count: 7.9 10*3/uL (ref 4.0–10.5)
nRBC: 0 % (ref 0.0–0.2)

## 2021-10-11 MED ORDER — ALPRAZOLAM 0.25 MG PO TABS: 0.2500 mg | ORAL_TABLET | Freq: Every day | ORAL | 0 refills | Status: DC

## 2021-10-11 NOTE — Telephone Encounter (Signed)
Pts wife called stating they were advised pt can be given a rx for anxiety and would like this sent to his [facility] pharmacy.

## 2021-10-11 NOTE — Telephone Encounter (Signed)
GERREN HOFFMEIER spouse [name] [phone]); "Radiation advised me to call you to guide or help me with [employer] disability claim and [employer], reference number [id number].  The information I sent was not accepted without diagnosis codes.  He just saw Dr. Julien Nordmann today and received ICD-10 code.  Should I have give them your number?"  Advised to connect with [employer] for steps to file claim and what information is needed by them to validate.    Will fax [facility] Release and Disclosure to "[email or web address], [employer] phone number is ([phone])."

## 2021-10-11 NOTE — Patient Instructions (Signed)
Steps to Quit Smoking Smoking tobacco is the leading cause of preventable death. It can affect almost every organ in the body. Smoking puts you and people around you at risk for many serious, long-lasting (chronic) diseases. Quitting smoking can be hard, but it is one of the best things that you can do for your health. It is never too late to quit. How do I get ready to quit? When you decide to quit smoking, make a plan to help you succeed. Before you quit: Pick a date to quit. Set a date within the next 2 weeks to give you time to prepare. Write down the reasons why you are quitting. Keep this list in places where you will see it often. Tell your family, friends, and co-workers that you are quitting. Their support is important. Talk with your doctor about the choices that may help you quit. Find out if your health insurance will pay for these treatments. Know the people, places, things, and activities that make you want to smoke (triggers). Avoid them. What first steps can I take to quit smoking? Throw away all cigarettes at home, at work, and in your car. Throw away the things that you use when you smoke, such as ashtrays and lighters. Clean your car. Make sure to empty the ashtray. Clean your home, including curtains and carpets. What can I do to help me quit smoking? Talk with your doctor about taking medicines and seeing a counselor at the same time. You are more likely to succeed when you do both. If you are pregnant or breastfeeding, talk with your doctor about counseling or other ways to quit smoking. Do not take medicine to help you quit smoking unless your doctor tells you to do so. To quit smoking: Quit right away Quit smoking totally, instead of slowly cutting back on how much you smoke over a period of time. Go to counseling. You are more likely to quit if you go to counseling sessions regularly. Take medicine You may take medicines to help you quit. Some medicines need a  prescription, and some you can buy over-the-counter. Some medicines may contain a drug called nicotine to replace the nicotine in cigarettes. Medicines may: Help you to stop having the desire to smoke (cravings). Help to stop the problems that come when you stop smoking (withdrawal symptoms). Your doctor may ask you to use: Nicotine patches, gum, or lozenges. Nicotine inhalers or sprays. Non-nicotine medicine that is taken by mouth. Find resources Find resources and other ways to help you quit smoking and remain smoke-free after you quit. These resources are most helpful when you use them often. They include: Online chats with a counselor. Phone quitlines. Printed self-help materials. Support groups or group counseling. Text messaging programs. Mobile phone apps. Use apps on your mobile phone or tablet that can help you stick to your quit plan. There are many free apps for mobile phones and tablets as well as websites. Examples include Quit Guide from the CDC and smokefree.gov  What things can I do to make it easier to quit?  Talk to your family and friends. Ask them to support and encourage you. Call a phone quitline (1-800-QUIT-NOW), reach out to support groups, or work with a counselor. Ask people who smoke to not smoke around you. Avoid places that make you want to smoke, such as: Bars. Parties. Smoke-break areas at work. Spend time with people who do not smoke. Lower the stress in your life. Stress can make you want to   smoke. Try these things to help your stress: Getting regular exercise. Doing deep-breathing exercises. Doing yoga. Meditating. Doing a body scan. To do this, close your eyes, focus on one area of your body at a time from head to toe. Notice which parts of your body are tense. Try to relax the muscles in those areas. How will I feel when I quit smoking? Day 1 to 3 weeks Within the first 24 hours, you may start to have some problems that come from quitting tobacco.  These problems are very bad 2-3 days after you quit, but they do not often last for more than 2-3 weeks. You may get these symptoms: Mood swings. Feeling restless, nervous, angry, or annoyed. Trouble concentrating. Dizziness. Strong desire for high-sugar foods and nicotine. Weight gain. Trouble pooping (constipation). Feeling like you may vomit (nausea). Coughing or a sore throat. Changes in how the medicines that you take for other issues work in your body. Depression. Trouble sleeping (insomnia). Week 3 and afterward After the first 2-3 weeks of quitting, you may start to notice more positive results, such as: Better sense of smell and taste. Less coughing and sore throat. Slower heart rate. Lower blood pressure. Clearer skin. Better breathing. Fewer sick days. Quitting smoking can be hard. Do not give up if you fail the first time. Some people need to try a few times before they succeed. Do your best to stick to your quit plan, and talk with your doctor if you have any questions or concerns. Summary Smoking tobacco is the leading cause of preventable death. Quitting smoking can be hard, but it is one of the best things that you can do for your health. When you decide to quit smoking, make a plan to help you succeed. Quit smoking right away, not slowly over a period of time. When you start quitting, seek help from your doctor, family, or friends. This information is not intended to replace advice given to you by your health care provider. Make sure you discuss any questions you have with your health care provider. Document Revised: 06/23/2021 Document Reviewed: 01/03/2019 [employer] Patient Education  2022 [employer]  

## 2021-10-11 NOTE — Telephone Encounter (Signed)
I called and spoke with the patient's wife and they are going to move forward with systemic therapy. I'll talk with Dr. Lisbeth Renshaw about options of radiation to his femur and lung. Dr. Julien Nordmann favors cancelling the liver biopsy and ordering a staging MRI. She has questions about disability and also using their cancer policy.

## 2021-10-11 NOTE — Progress Notes (Signed)
[name] [phone]   [phone]  CONSULT NOTE  REFERRING PHYSICIAN: Dr. Elige Radon [name]  REASON FOR CONSULTATION:  60 years old white male with questionable metastatic lung cancer.  HPI Chris [name] is a 60 y.o. male with past medical history significant for hypertension, coronary artery disease, dyslipidemia, myocardial infarction, prostate adenocarcinoma 2019 status post seed implants under the care of Dr. Kathrynn [name], hearing loss, and anxiety, GERD and long history of smoking.  The patient mentions that over the last 6 months he noticed a lesion on the back of his scalp that has been increasing in size.  He was seen by Dr. Doylene [name] and he had excision of that lesion and the final pathology ([name]) was consistent with poorly differentiated squamous cell carcinoma. By immunohistochemistry, the neoplastic cells are positive for cytokeratin 5/6, cytokeratin AE1/3 and p63 (weak) but negative for p40, S100, Melan-A, D2-40 and CD31.  Overall, the morphology and immunophenotype are consistent with a poorly differentiated squamous cell carcinoma.  He had CT scan of the neck on September 20, 2021 to rule out any lymphadenopathy and it showed 0.75 centimeters posterior lymph node on the right which is indeterminate and no enlarged lymph nodes in the left posterior neck or scalp.  The scan also showed 1.7 cm right upper lobe lung nodule suspicious for carcinoma of the lung.  A PET scan was performed on September 28, 2021 and it showed the spiculated nodule at the right lung apex is hypermetabolic and concerning for primary bronchogenic carcinoma.  There was hypermetabolic activity within the occipital scalp and a small previously demonstrated right occipital lymph node compatible with known squamous cell carcinoma.  There was also hypermetabolic lesion inferiorly in the right hepatic lobe and in the proximal right femoral diaphysis suspicious for metastatic disease  and the primary is uncertain in this patient with a history of prostate cancer.  The patient had MRI of the liver on October 05, 2021 and that showed peripheral enhancing lesion in the right hepatic lobe which corresponds to the hypermetabolic lesion on the PET scan and consistent with solitary hepatic metastasis. On October 09, 2021 the patient underwent video bronchoscopy with robotic assisted bronchogenic navigation under the care of Dr. Delton [name].  The final pathology is still pending. The patient was referred to me today for evaluation and recommendation regarding treatment of his condition.   When seen today the patient continues to complain of fatigue especially in the evening time.  He has mild cough and shortness of breath with exertion but no significant chest pain or hemoptysis.  He denied having any weight loss.  He has no nausea, vomiting, diarrhea or constipation.  He has intermittent headache and neck pain. Family history significant for mother with dementia.  Father was obese.  Half sister with pulmonary embolism. The patient is married and has 1 son.  He used to work for [name].  He was accompanied today by his wife [name].  The patient has a history for smoking more than 1 pack/day for around 46 years and unfortunately he continues to smoke.  He has no alcohol or drug abuse.  HPI  Past Medical History:  Diagnosis Date   Anxiety    associated with medical care,  worsened by difficulty hearing, does better with wife present   Complication of anesthesia    wife states very anxious may need pre sedation   Coronary artery disease    GERD (gastroesophageal reflux disease)    Hearing  loss    mild per wife   Hearing loss    no hearing aids per wife   Hyperlipidemia    Hypertension    MI (myocardial infarction) ([name]) 04/17/2006   Acute inferolateral wall MI with cardiogenic shock and complete heart block   Prostate cancer ([place])    Tobacco abuse    Wears glasses    Wears  partial dentures    Upper    Past Surgical History:  Procedure Laterality Date   CARDIAC CATHETERIZATION  2007   left, RCA 100% occluded ruptured plaque with thrombus in the proximal segment   CYST EXCISION N/A 08/30/2021   Procedure: EXCISION OF POSTERIOR SCALP CYST;  Surgeon: Chris Riley, MD;  Location: [facility];  Service: General;  Laterality: N/A;   CYSTOSCOPY N/A 07/17/2018   Procedure: [name];  Surgeon: Chris Gallo, MD;  Location: [facility];  Service: Urology;  Laterality: N/A;  no seeds in bladder per Dr Chris [name]   PROSTATE BIOPSY     RADIOACTIVE SEED IMPLANT N/A 07/17/2018   Procedure: RADIOACTIVE SEED IMPLANT/BRACHYTHERAPY IMPLANT;  Surgeon: Chris Gallo, MD;  Location: [facility];  Service: Urology;  Laterality: N/A;  77 seeds   SPACE OAR INSTILLATION N/A 07/17/2018   Procedure: SPACE OAR INSTILLATION;  Surgeon: Chris Gallo, MD;  Location: [facility];  Service: Urology;  Laterality: N/A;   WRIST SURGERY  10/04/2012    Family History  Problem Relation Age of Onset   Prostate cancer Neg Hx    Kidney cancer Neg Hx    Cancer Neg Hx     Social History Social History   Tobacco Use   Smoking status: Every Day    Packs/day: 1.50    Years: 42.00    Pack years: 63.00    Types: Cigarettes   Smokeless tobacco: Never  Vaping Use   Vaping Use: Never used  Substance Use Topics   Alcohol use: No   Drug use: No    No Known Allergies  Current Outpatient Medications  Medication Sig Dispense Refill   atorvastatin (LIPITOR) 20 MG tablet Take 1 tablet (20 mg total) by mouth daily. 90 tablet 3   clopidogrel (PLAVIX) 75 MG tablet Take 1 tablet (75 mg total) by mouth daily. TAKE 1 TABLET (75 MG TOTAL) BY MOUTH DAILY. Okay to restart this medication on 10/10/2021. 90 tablet 3   ketoconazole (NIZORAL) 2 % cream Apply 1 application topically 2 (two) times daily as needed for irritation.     metoprolol tartrate  (LOPRESSOR) 25 MG tablet Take 1 tablet (25 mg total) by mouth daily. 30 tablet 11   omeprazole (PRILOSEC OTC) 20 MG tablet Take 20 mg by mouth daily.     tamsulosin (FLOMAX) 0.4 MG CAPS capsule Take 1 capsule (0.4 mg total) by mouth daily. 30 capsule 3   No current facility-administered medications for this visit.    Review of Systems  Constitutional: positive for fatigue Eyes: negative Ears, nose, mouth, throat, and face: negative Respiratory: positive for cough and dyspnea on exertion Cardiovascular: negative Gastrointestinal: negative Genitourinary:negative Integument/breast: negative Hematologic/lymphatic: negative Musculoskeletal:negative Neurological: positive for headaches Behavioral/Psych: negative Endocrine: negative Allergic/Immunologic: negative  Physical Exam  [id number]:[id number], healthy, no distress, well nourished, and well developed SKIN: skin color, texture, turgor are normal, no rashes or significant lesions HEAD: Normocephalic, No masses, lesions, tenderness or abnormalities EYES: normal, PERRLA, Conjunctiva are pink and non-injected EARS: External ears normal, Canals clear OROPHARYNX:no exudate, no erythema, and lips, buccal mucosa, and  tongue normal  NECK: supple, no adenopathy, no JVD LYMPH:  no palpable lymphadenopathy, no hepatosplenomegaly LUNGS: clear to auscultation , and palpation HEART: regular rate & rhythm, no murmurs, and no gallops ABDOMEN:abdomen soft, non-tender, normal bowel sounds, and no masses or organomegaly BACK: Back symmetric, no curvature., No CVA tenderness EXTREMITIES:no joint deformities, effusion, or inflammation, no edema  NEURO: alert & oriented x 3 with fluent speech, no focal motor/sensory deficits  PERFORMANCE STATUS: ECOG 1  LABORATORY DATA: Lab Results  Component Value Date   WBC 7.3 10/09/2021   HGB 16.8 10/09/2021   HCT 51.7 10/09/2021   MCV 90.7 10/09/2021   PLT 221 10/09/2021      Chemistry      Component Value  Date/Time   NA 137 10/09/2021 0935   K 4.3 10/09/2021 0935   CL 109 10/09/2021 0935   CO2 16 (L) 10/09/2021 0935   BUN 19 10/09/2021 0935   CREATININE 0.96 10/09/2021 0935      Component Value Date/Time   CALCIUM 8.7 (L) 10/09/2021 0935   ALKPHOS 70 01/05/2021 1009   AST 23 01/05/2021 1009   ALT 27 01/05/2021 1009   BILITOT 1.0 01/05/2021 1009       RADIOGRAPHIC STUDIES: CT Soft Tissue Neck W Contrast  Result Date: 09/20/2021 CLINICAL DATA:  Squamous cell carcinoma scalp. Question left posterior cervical lymph node. EXAM: CT NECK WITH CONTRAST TECHNIQUE: Multidetector CT imaging of the neck was performed using the standard protocol following the bolus administration of intravenous contrast. CONTRAST:  67mL OMNIPAQUE IOHEXOL 350 MG/ML SOLN COMPARISON:  None. FINDINGS: Pharynx and larynx: Normal. No mass or swelling. Salivary glands: No inflammation, mass, or stone. Thyroid: Negative Lymph nodes: Possible abnormality in the left posterior scalp was not localized by the technologist. No suspicious lymph node is seen in the left posterior scalp. 7.5 mm lymph node in the right posterior scalp at the C3 level. This has ill-defined margins and is indeterminate but could represent metastatic disease. No pathologically enlarged lymph nodes are seen in the anterior neck. Vascular: Normal vascular enhancement. Mild atherosclerotic disease in the carotid arteries bilaterally. Limited intracranial: Negative Visualized orbits: Not imaged Mastoids and visualized paranasal sinuses: Large retention cyst left maxillary sinus. Remaining sinuses clear. Right mastoid effusion. Skeleton: No evidence of skeletal metastasis. Mild compression fracture T4 and T5 which are probably chronic. Upper chest: 17 mm nodule right upper lobe with spiculated margins. Other: None IMPRESSION: 1. 7.5 mm posterior lymph node on the right which is indeterminate but could be due to metastatic disease. Consider biopsy. No enlarged lymph  nodes in the left posterior neck or scalp. 2. 17 mm spiculated mass right upper lobe, suspicious for carcinoma lung. Recommend CT chest with contrast for further evaluation. Electronically Signed   By: [name] M.D.   On: 09/20/2021 15:22   MR LIVER W WO CONTRAST  Result Date: 10/06/2021 CLINICAL DATA:  Squamous cell carcinoma. Liver lesion identified on PET-CT scan. MRI recommended for further characterization. Hypermetabolic lung nodule additionally EXAM: MRI ABDOMEN WITHOUT AND WITH CONTRAST TECHNIQUE: Multiplanar multisequence MR imaging of the abdomen was performed both before and after the administration of intravenous contrast. CONTRAST:  51mL GADAVIST GADOBUTROL 1 MMOL/ML IV SOLN COMPARISON:  PET-CT scan 09/28/2021, CT chest 10/06/2021 FINDINGS: Lower chest:  Lung bases are clear. Hepatobiliary: Within the RIGHT hepatic lobe, there is a peripheral enhancing lesion which corresponds to the hypermetabolic lesion on comparison FDG PET scan. Lesion measures 16 mm by 16 mm on image 49/series  16. Lesion has continuous peripheral enhancement typical of metastatic lesion. No additional lesions are present in the liver. Normal gallbladder and biliary tree. Pancreas: Normal pancreatic parenchymal intensity. No ductal dilatation or inflammation. Spleen: Normal spleen. Adrenals/urinary tract: Loss of signal intensity in the adrenal glands consistent benign bilateral adrenal adenomas. Stomach/Bowel: thickening of the gastric mucosa to 2.8 cm (image 15/4). No abnormal metabolic activity on comparison FDG PET scan. Vascular/Lymphatic: Abdominal aortic normal caliber. No retroperitoneal periportal lymphadenopathy. Musculoskeletal: No aggressive osseous lesion IMPRESSION: 1. Peripheral enhancing lesion in the RIGHT hepatic lobe which corresponds to hypermetabolic lesion on comparison FDG PET scan is consistent with solitary hepatic metastasis. 2. Bilateral benign adrenal adenomas. Electronically Signed   By:  Suzy Bouchard M.D.   On: 10/06/2021 10:36   NM PET Image Initial (PI) Skull Base To Thigh  Result Date: 09/29/2021 CLINICAL DATA:  Initial treatment strategy for squamous cell carcinoma of the scalp. Right apical pulmonary nodule and posterior cervical adenopathy on CT. EXAM: NUCLEAR MEDICINE PET SKULL BASE TO THIGH TECHNIQUE: 12.3 mCi F-18 FDG was injected intravenously. Full-ring PET imaging was performed from the skull base to thigh after the radiotracer. CT data was obtained and used for attenuation correction and anatomic localization. Fasting blood glucose: 97 mg/dl COMPARISON:  CT neck 09/20/2021. FINDINGS: Mediastinal blood pool activity: SUV max 2.3 NECK: Imaging of the head and neck is limited by motion artifact and resulting misregistration between the PET and CT images. There is hypermetabolic activity in the central occipital scalp (SUV max 5.8) which may correspond with the patient's known scalp lesion. The previously demonstrated small right occipital lymph node is mildly hypermetabolic (SUV max 2.9). No other hypermetabolic cervical lymph nodes are identified.Mucosal assessment limited due to the motion. Activity within the lymphoid tissue of Waldeyer's ring is grossly unremarkable. Incidental CT findings: Left maxillary sinus retention cysts and bilateral carotid atherosclerosis. CHEST: There are no hypermetabolic mediastinal, hilar or axillary lymph nodes. The previously demonstrated irregular right apical lesion is hypermetabolic. This lesion measures 2.4 x 1.3 cm on image 11/8 and has an SUV max of 5.2, suspicious for malignancy. No other hypermetabolic pulmonary activity or suspicious nodularity. Incidental CT findings: Mild atherosclerosis of the aorta, great vessels and coronary arteries. ABDOMEN/PELVIS: Focal hypermetabolic activity inferiorly in the right hepatic lobe, corresponding with a 1.8 x 1.5 cm lesion in segment 5 (image 146/4). This has an SUV max of 5.7. No other  hypermetabolic activity identified in the liver, spleen, pancreas or adrenal glands. There is no hypermetabolic nodal activity. Incidental CT findings: Mild low-density enlargement of both adrenal glands without hypermetabolic activity, consistent with hyperplasia or small adenomas. Seed implants are noted within the prostate gland. Mild aortic and branch vessel atherosclerosis. SKELETON: There is a focal hypermetabolic lesion within the proximal right femoral diaphysis (SUV max 7.4). This corresponds with an area of increased density within the medullary cavity on CT image 237/4. No other hypermetabolic osseous activity. Incidental CT findings: none IMPRESSION: 1. The spiculated nodule at the right lung apex on recent neck CT is hypermetabolic and is concerning for primary bronchogenic carcinoma. Tissue sampling recommended. 2. Hypermetabolic activity within the occipital scalp and small previously demonstrated right occipital lymph node compatible with known squamous cell carcinoma. Evaluation of the head and neck limited by motion artifact. 3. Hypermetabolic lesions inferiorly in the right hepatic lobe and in the proximal right femoral diaphysis suspicious for metastatic disease, primary uncertain in this patient with a history of prostate cancer. Correlate with PSA levels. Abdominal  MRI without and with contrast may be helpful for further characterization of the liver lesion. Electronically Signed   By: Richardean Sale M.D.   On: 09/29/2021 13:54   DG Chest Port 1 View  Result Date: 10/09/2021 CLINICAL DATA:  Post bronchoscopy. EXAM: PORTABLE CHEST 1 VIEW COMPARISON:  01/05/2021 and CT chest 10/06/2021. FINDINGS: Trachea is midline. Heart is enlarged. A fiducial marker is seen at the site of a known right upper lobe nodule. No definite pneumothorax status post bronchoscopic biopsy. There may be streaky atelectasis in both lung bases. No airspace consolidation. No pleural fluid. IMPRESSION: 1. Fiducial  marker localizes a known right upper lobe nodule. No definite pneumothorax after bronchoscopic biopsy. 2. Bibasilar streaky atelectasis. Electronically Signed   By: Lorin Picket M.D.   On: 10/09/2021 13:09   DG C-Arm 1-60 Min-No Report  Result Date: 10/09/2021 Fluoroscopy was utilized by the requesting physician.  No radiographic interpretation.   CT Super D Chest Wo Contrast  Result Date: 10/06/2021 CLINICAL DATA:  Lung mass.  Squamous cell carcinoma. EXAM: CT CHEST WITHOUT CONTRAST TECHNIQUE: Multidetector CT imaging of the chest was performed using thin slice collimation for electromagnetic bronchoscopy planning purposes, without intravenous contrast. COMPARISON:  PET-CT scan 09/28/2021 FINDINGS: Cardiovascular: Coronary artery calcification and aortic atherosclerotic calcification. Mediastinum/Nodes: No axillary or supraclavicular adenopathy. No mediastinal or hilar adenopathy. No pericardial fluid. Esophagus normal. Lungs/Pleura: Spiculated nodule in the RIGHT lung apex measures 2.6 x 1.5 cm (image 24/CT series 3). This nodule is hypermetabolic on comparison FDG PET scan. No additional pulmonary nodules identified. Upper Abdomen: Low-attenuation lesion in the RIGHT hepatic lobe corresponds to hypermetabolic lesion on comparison FDG PET scan. Low-attenuation of the adrenal glands consistent with adenomas or hyperplasia. Musculoskeletal: No aggressive osseous lesion. IMPRESSION: 1. Spiculated mass in the RIGHT upper lobe remains concerning for malignancy. 2. Low-density lesion in the RIGHT hepatic lobe corresponds to hypermetabolic lesion on comparison PET-CT scan and concerning for metastatic liver lesion. 3. Adrenal hyperplasia versus adenomas. Electronically Signed   By: Suzy Bouchard M.D.   On: 10/06/2021 10:22   DG C-ARM BRONCHOSCOPY  Result Date: 10/09/2021 C-ARM BRONCHOSCOPY: Fluoroscopy was utilized by the requesting physician.  No radiographic interpretation.    ASSESSMENT: This is  a very pleasant 60 years old white male with likely metastatic squamous cell carcinoma of the skin or lung origin presented with right upper lobe lung nodule in addition to solitary liver and right femur metastasis as well as posterior occipital scalp squamous cell carcinoma that was resected diagnosed and December 2022.  The patient underwent bronchoscopy with biopsy of the right upper lobe lung mass but the final pathology is still pending.   PLAN: I had a lengthy discussion with the patient and his wife today about his current disease stage, prognosis and treatment options. I explained to the patient that this is metastatic disease either with a lung primary or skin primary but may have similar treatment. I would wait for the final pathology from the lung biopsy for confirmation of the same histology. If the biopsy from the lung is consistent with a squamous cell carcinoma, we will send it for PD-L1 expression. I will also arrange for the patient to complete the staging work-up by ordering MRI of the brain to rule out brain metastasis. I also discussed with the patient the treatment options and he understand that he has incurable condition and all the treatment will be of palliative nature. He was giving the option of palliative care and hospice referral  versus consideration of palliative systemic chemotherapy with carboplatin, paclitaxel in addition to immunotherapy either with pembrolizumab or cemiplimab.  I may also consider him for treatment with single agent immunotherapy if his PD-L1 expression is over 50% and he declined chemotherapy. Regarding the solitary lesion and the lung and femur, the patient may benefit from SBRT to the lung and palliative radiotherapy to the right femoral lesion.  We will refer him back to radiation oncology for discussion of this option. I will see the patient back for follow-up visit in around 1 week for reevaluation and more discussion of his treatment options based  on the final pathology and staging work-up. The patient voices understanding of current disease status and treatment options and is in agreement with the current care plan.  All questions were answered. The patient knows to call the clinic with any problems, questions or concerns. We can certainly see the patient much sooner if necessary.  Thank you so much for allowing me to participate in the care of Chris [name]. I will continue to follow up the patient with you and assist in his care.  The total time spent in the appointment was 90 minutes.  Disclaimer: This note was dictated with voice recognition software. Similar sounding words can inadvertently be transcribed and may not be corrected upon review.   Eilleen Kempf October 11, 2021, 11:51 AM

## 2021-10-11 NOTE — Telephone Encounter (Signed)
Called and spoke with Chris [name] wife states cardiac clearance for patient needs to be sent to Quay Burow MD. I don't see anything about that in the last office note.  Dr. Valeta Harms please advise

## 2021-10-12 ENCOUNTER — Telehealth: Payer: Self-pay | Admitting: Pulmonary Disease

## 2021-10-12 LAB — PSA, TOTAL AND FREE
PSA, Free Pct: UNDETERMINED %
PSA, Free: <0.02 ng/mL
Prostate Specific Ag, Serum: <0.1 ng/mL (ref 0.0–4.0)

## 2021-10-12 NOTE — Telephone Encounter (Signed)
Called Elizabeth back but she did not answer. Left message for her to call back.

## 2021-10-12 NOTE — Telephone Encounter (Signed)
10/12/2021 this nurse e-mailed [place] Disability/FMLA cover sheet and Request and Authorization for use and disclosure of Protected Health Information [facility]) as requested.  Successful e-mail transfer message read per [employer] floor printer [place].    Spouse to clarify how and what is needed to file claim(s).  Expecting form(s) by e-mail or delivery during scheduled [employer] office visit.    Last evening this nurse shared reimbursement claims vary.  All request Pathology report to support provider treating diagnosis code(s).  May require any or all of the following: Attending Physician Certification Statement, Itemized Billing with CPT codes and treatment locations, other reports and other PHI be returned together as a complete package for review.   Incomplete claims are not reviewed but thrown out without notice to policy owner.      "I have itemized statements and pathology.  Damone had several tests since October yet we just received diagnosis codes today.  He is currently working as a TEFL teacher, now expecting chemotherapy."

## 2021-10-13 ENCOUNTER — Encounter: Payer: Self-pay | Admitting: *Deleted

## 2021-10-13 ENCOUNTER — Ambulatory Visit (HOSPITAL_COMMUNITY)
Admission: RE | Admit: 2021-10-13 | Discharge: 2021-10-13 | Disposition: A | Discharge status: Home / Self Care | Payer: BC Managed Care – PPO | Source: Ambulatory Visit | Attending: Internal Medicine | Admitting: Internal Medicine

## 2021-10-13 ENCOUNTER — Other Ambulatory Visit: Payer: Self-pay

## 2021-10-13 DIAGNOSIS — J341 Cyst and mucocele of nose and nasal sinus: Secondary | ICD-10-CM | POA: Diagnosis not present

## 2021-10-13 DIAGNOSIS — C3490 Malignant neoplasm of unspecified part of unspecified bronchus or lung: Secondary | ICD-10-CM | POA: Insufficient documentation

## 2021-10-13 LAB — CYTOLOGY - NON PAP

## 2021-10-13 IMAGING — MR MR HEAD WO/W CM
13 series · 48 of 48 positions shown · IV contrast (10 GADAVIST)
Comparison: Head CT [DATE]

CLINICAL DATA: Non-small cell lung cancer.  Staging.

EXAM:
MRI HEAD WITHOUT AND WITH CONTRAST
TECHNIQUE: Multiplanar, multiecho pulse sequences of the brain and surrounding
structures were obtained without and with intravenous contrast.
CONTRAST:  10mL GADAVIST GADOBUTROL 1 MMOL/ML IV SOLN

[Series 5: DWI · axial · 3.0mm · 1.36mm/px · z∈[-86,+83]mm · 8 of 116 slices shown (1 of 2)]
[im 1/116]
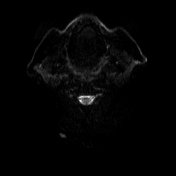
[im 17/116]
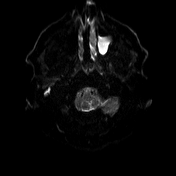
[im 33/116]
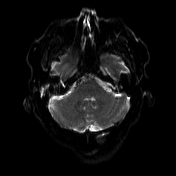
[im 50/116]
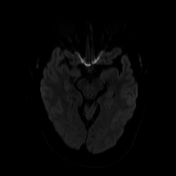
[im 66/116]
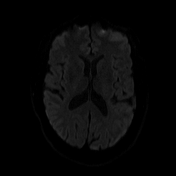
[im 83/116]
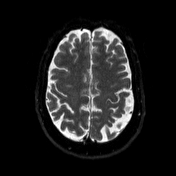
[im 99/116]
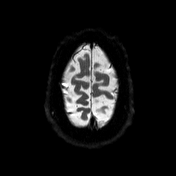
[im 116/116]
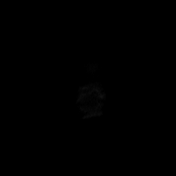

[Series 6: DWI · axial · 3.0mm · 1.36mm/px · z∈[-86,+83]mm · 4 of 58 slices shown (2 of 2)]
[im 1/58]
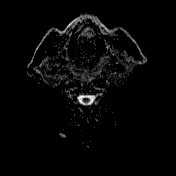
[im 20/58]
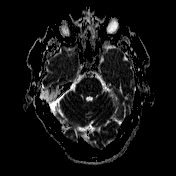
[im 39/58]
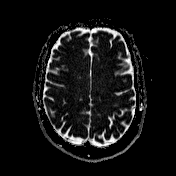
[im 58/58]
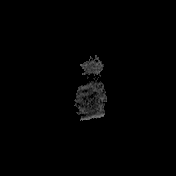

[Series 7: T1 · sagittal · 5.0mm · 0.75mm/px · 1 of 26 slices shown (1 of 2)]
[im 1/26]
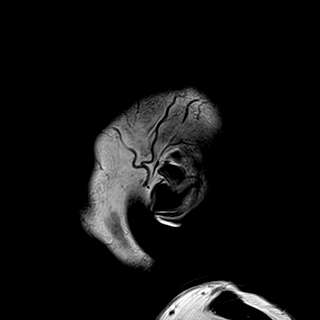

[Series 8: T2 · axial · 5.0mm · 0.62mm/px · 1 of 26 slices shown]
[im 1/26]
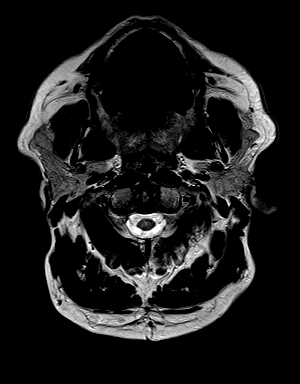

[Series 9: swi_images · axial · 3.0mm · 0.75mm/px · z∈[-75,+87]mm · 3 of 56 slices shown]
[im 1/56]
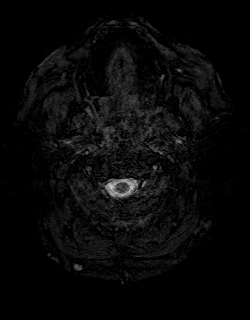
[im 28/56]
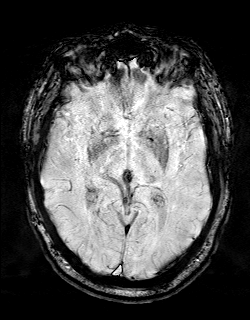
[im 56/56]
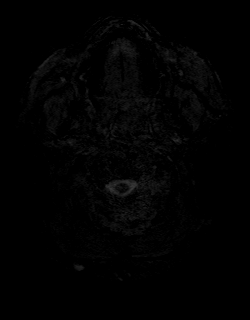

[Series 11: FLAIR · axial · 3.0mm · 0.75mm/px · z∈[-73,+85]mm · 3 of 55 slices shown]
[im 1/55]
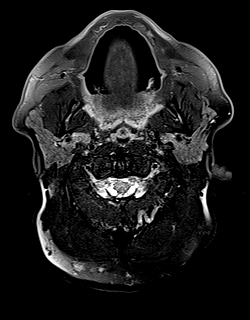
[im 28/55]
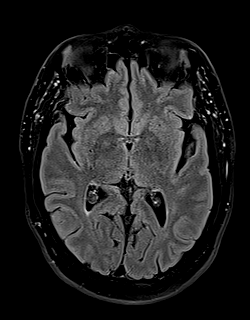
[im 55/55]
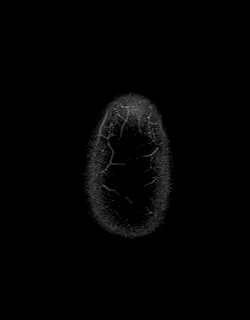

[Series 12: T1 · axial · 1.0mm · 0.94mm/px · z∈[-69,+86]mm · 9 of 160 slices shown (2 of 2)]
[im 1/160]
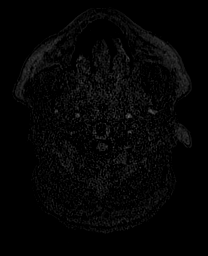
[im 20/160]
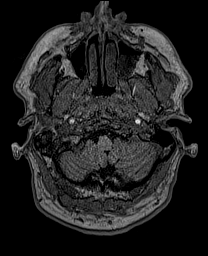
[im 40/160]
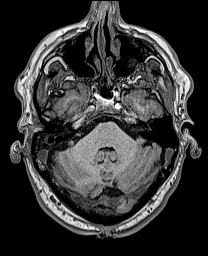
[im 60/160]
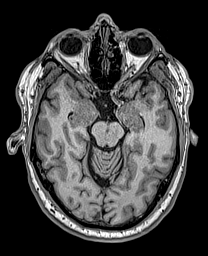
[im 80/160]
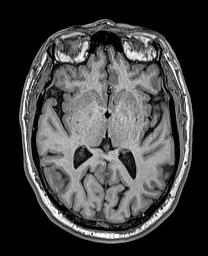
[im 100/160]
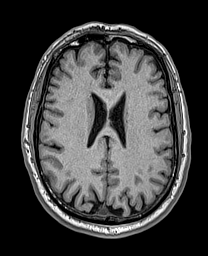
[im 120/160]
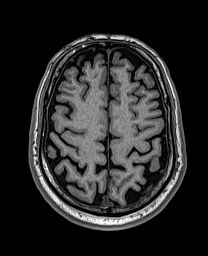
[im 140/160]
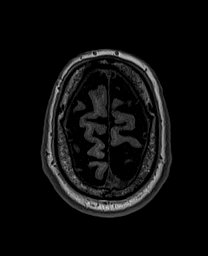
[im 160/160]
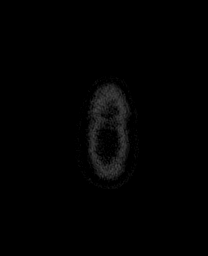

[Series 13: cor dwi_tracew · coronal · 5.0mm · 1.53mm/px · 3 of 60 slices shown]
[im 1/60]
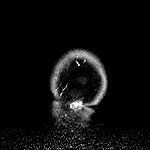
[im 30/60]
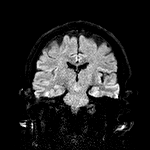
[im 60/60]
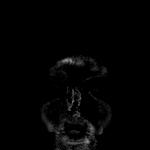

[Series 14: cor dwi_adc · coronal · 5.0mm · 1.53mm/px · 2 of 30 slices shown]
[im 1/30]
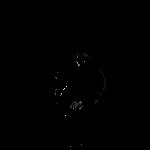
[im 30/30]
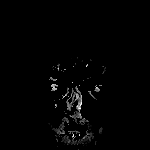

[Series 15: T2 post-contrast · coronal · 5.0mm · 0.57mm/px · 2 of 32 slices shown]
[im 1/32]
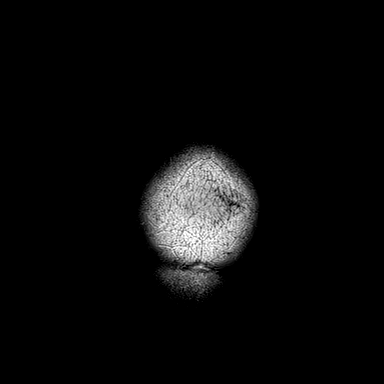
[im 32/32]
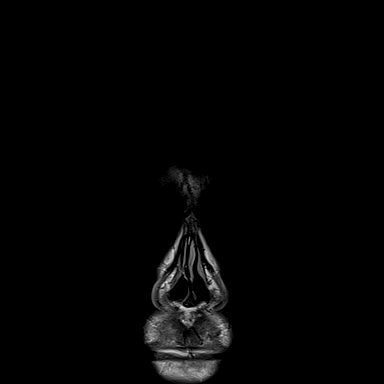

[Series 16: T1 post-contrast · axial · 1.0mm · 0.94mm/px · z∈[-69,+86]mm · 9 of 160 slices shown (1 of 3)]
[im 1/160]
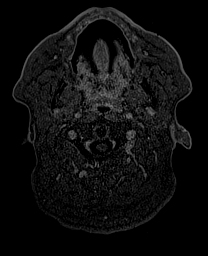
[im 20/160]
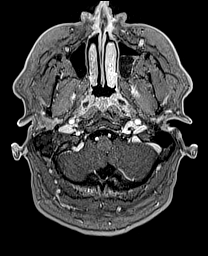
[im 40/160]
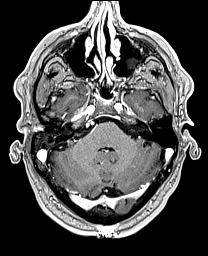
[im 60/160]
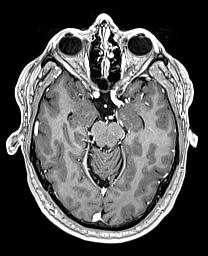
[im 80/160]
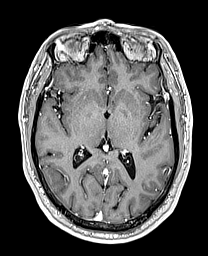
[im 100/160]
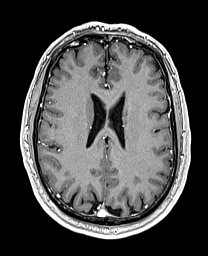
[im 120/160]
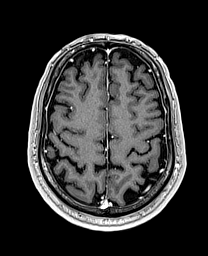
[im 140/160]
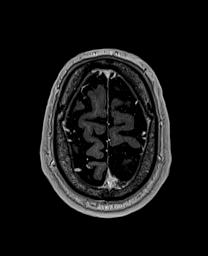
[im 160/160]
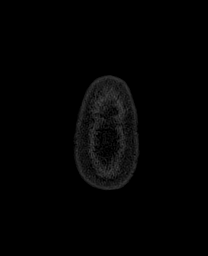

[Series 17: T1 post-contrast · coronal · 5.0mm · 0.43mm/px · 2 of 32 slices shown (2 of 3)]
[im 1/32]
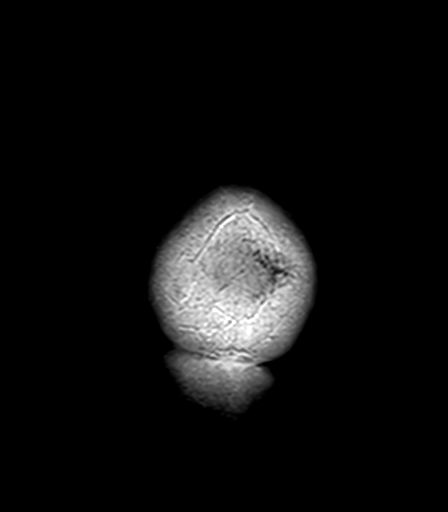
[im 32/32]
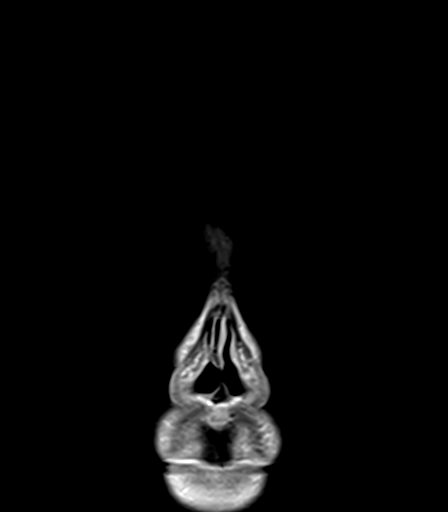

[Series 18: T1 post-contrast · sagittal · 5.0mm · 0.75mm/px · 1 of 26 slices shown (3 of 3)]
[im 1/26]
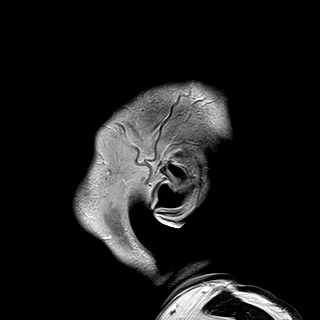

[48 of 48 positions shown; findings below may reference images not displayed]

FINDINGS: Brain: Diffusion imaging does not show any acute or subacute
infarction or other cause of restricted diffusion. No abnormality
affects the brainstem or cerebellum. Cerebral hemispheres show a few
punctate foci of T2 and FLAIR signal in the white matter, often seen
at this age. No cortical or large vessel territory infarction. No
evidence of primary or metastatic mass lesion, hemorrhage,
hydrocephalus or extra-axial collection. After contrast
administration, no abnormal enhancement occurs.

Vascular: Major vessels at the base of the brain show flow.

Skull and upper cervical spine: Negative

Sinuses/Orbits: Retention cyst in the left maxillary sinus. Mastoid
effusion on the right.

Other: None
IMPRESSION: No evidence of metastatic disease.

Few punctate foci of T2 and FLAIR signal in the hemispheric white
matter, consistent with minimal small vessel change.

Right mastoid effusion, extensive.  No causative feature identified.

## 2021-10-13 MED ORDER — GADOBUTROL 1 MMOL/ML IV SOLN
10.0000 mL | Freq: Once | INTRAVENOUS | Status: AC | PRN
  Administered 2021-10-13: 10 mL via INTRAVENOUS

## 2021-10-13 NOTE — Progress Notes (Signed)
Oncology Nurse Navigator Documentation  Oncology Nurse Navigator Flowsheets 10/13/2021 10/03/2021 08/01/2018 05/30/2018 05/05/2018 04/01/2018  Abnormal Finding Date - 09/20/2021 - - - 01/01/2018  Confirmed Diagnosis Date 10/09/2021 - - - - 02/07/2018  Diagnosis Status Confirmed Diagnosis Complete Additional Work Up - - - -  Planned Course of Treatment Chemotherapy;Targeted Therapy;Radiation - - - - -  Phase of Treatment Radiation - - - - -  Expected Surgery Date - - 07/17/2018 - - -  Navigator Follow Up Date: 10/24/2021 10/05/2021 - - - -  Navigator Follow Up Reason: Appointment Review;Review Note Appointment Review - - - -  Navigator Location [facility]  Referral Date to RadOnc/MedOnc - 10/03/2021 - - - -  Navigator Encounter Type Other:/I followed up on Dr. Worthy Flank note and tx plan.  Patient is scheduled for his MRI brain today and follow up with Dr. Julien Nordmann next week.  I will contact rad onc to check on their plan for XRT to hip.   Other: Treatment Treatment Telephone Telephone  Telephone - - - - Incoming Call Outgoing Call;Patient Update  Treatment Initiated Date - - - 05/30/2018 - -  Patient Visit Type Other Other - - - -  Treatment Phase Pre-Tx/Tx Discussion Abnormal Scans CT SIM;Post-Tx Follow-up CT SIM - -  Barriers/Navigation Needs Coordination of Care Coordination of Care Education Education Education Education  Education - - - Preparing for Upcoming Surgery/ Treatment Pain/ Symptom Management;Preparing for Upcoming Surgery/ Treatment Understanding Cancer/ Treatment Options  Interventions Coordination of Care Coordination of Care Education Education Education Education  Acuity Level 2-Minimal Needs (1-2 Barriers Identified) Level 2-Minimal Needs (1-2 Barriers Identified) Level 1 - - Level 2  Coordination of Care Other Other - - - -  Education Method - - Teach-back;Verbal Teach-back;Verbal;Written  Teach-back;Verbal Teach-back;Verbal  Support Groups/Services - - - - - Friends and Family  Time Spent with Patient 30 30 15 15 15  30

## 2021-10-13 NOTE — Telephone Encounter (Signed)
Spoke with the patient's wife - explained the tissue dx. Pathology most consistent with adenoCA.   Pt is having more cough, non-productive. No fevers. Offered him tessalon or cough syrup but they want to defer for now, will call me if a change  Will forward to Dr Edwena Blow

## 2021-10-13 NOTE — Telephone Encounter (Signed)
Called pt to review pathology > shows NSCLCA, suspect adeno. 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 No answer, left a mess with [name] - only # I have available.

## 2021-10-13 NOTE — Telephone Encounter (Signed)
Called and spoke with wife about this message and she stated that patient has already had surgery and nothing further is needed. Will close encounter.

## 2021-10-16 MED ORDER — AMOXICILLIN-POT CLAVULANATE 875-125 MG PO TABS: 1.0000 | ORAL_TABLET | Freq: Two times a day (BID) | ORAL | 0 refills | Status: DC

## 2021-10-16 NOTE — Telephone Encounter (Signed)
Freddi Starr, MD  You 1 hour ago (1:20 PM)   Augmentin 875mg  BID x 7 days   JD     Called and spoke with [name] letting her know recs per JD and she verbalized understanding.Rx for augmentin has been sent to preferred pharmacy. Nothing further needed.

## 2021-10-16 NOTE — Telephone Encounter (Signed)
Called and spoke with pt's spouse Chris [name] who stated that pt started running a low grade fever of 100.0.  she stated that when they spoke with Dr. Lamonte Sakai Friday 12/16 about pt's cough and when pt said that he did not want to take any meds for the cough that Dr. Lamonte Sakai said if pt started running a fever to call and he would send in an abx and treat it like bronchitis.  I did make Chris [name] aware that RB was not avail today that we would send this to provider of the day for recommendations but did tell her that RB was avail tomorrow if this had to wait until he returned and she verbalized understanding.  Dr. Erin Fulling, please advise if you are okay prescribing an abx to help with pt's symptoms. Pt was recently bronched by Dr. Lamonte Sakai 12/12 and his symptoms started after the procedure.

## 2021-10-16 NOTE — Addendum Note (Signed)
Addended by: Lorretta Harp on: 10/16/2021 02:43 PM   Modules accepted: Orders

## 2021-10-17 ENCOUNTER — Telehealth: Payer: Self-pay | Admitting: Radiation Oncology

## 2021-10-17 NOTE — Telephone Encounter (Signed)
I called the patient's wife to let her know we had spoken with Dr. Julien Nordmann. He will see the patient tomorrow to discuss systemic therapy which he plans to start early January 2023. Dr. Lisbeth Renshaw favors SBRT to the RUL and to the right hip area, and Dr. Julien Nordmann favors following the liver. We would consider treatment to the scalp and right cervical nodes after completion of radiation. Our dept will call to set up simulation hopefully tomorrow so we can finish XRT prior to chemo/immunotherapy.

## 2021-10-18 ENCOUNTER — Inpatient Hospital Stay: Payer: BC Managed Care – PPO

## 2021-10-18 ENCOUNTER — Ambulatory Visit
Admission: RE | Admit: 2021-10-18 | Discharge: 2021-10-18 | Disposition: A | Discharge status: Home / Self Care | Payer: BC Managed Care – PPO | Source: Ambulatory Visit | Attending: Radiation Oncology | Admitting: Radiation Oncology

## 2021-10-18 ENCOUNTER — Inpatient Hospital Stay (HOSPITAL_BASED_OUTPATIENT_CLINIC_OR_DEPARTMENT_OTHER): Payer: BC Managed Care – PPO | Admitting: Internal Medicine

## 2021-10-18 ENCOUNTER — Other Ambulatory Visit: Payer: Self-pay

## 2021-10-18 ENCOUNTER — Encounter: Payer: Self-pay | Admitting: Internal Medicine

## 2021-10-18 VITALS — BP 124/90 | HR 64 | Temp 97.0°F | Resp 19 | Ht 71.0 in | Wt 242.6 lb

## 2021-10-18 DIAGNOSIS — C3411 Malignant neoplasm of upper lobe, right bronchus or lung: Secondary | ICD-10-CM

## 2021-10-18 DIAGNOSIS — Z7689 Persons encountering health services in other specified circumstances: Secondary | ICD-10-CM | POA: Insufficient documentation

## 2021-10-18 DIAGNOSIS — C7951 Secondary malignant neoplasm of bone: Secondary | ICD-10-CM | POA: Insufficient documentation

## 2021-10-18 DIAGNOSIS — Z79899 Other long term (current) drug therapy: Secondary | ICD-10-CM | POA: Diagnosis not present

## 2021-10-18 DIAGNOSIS — Z5112 Encounter for antineoplastic immunotherapy: Secondary | ICD-10-CM | POA: Insufficient documentation

## 2021-10-18 DIAGNOSIS — Z5111 Encounter for antineoplastic chemotherapy: Secondary | ICD-10-CM

## 2021-10-18 DIAGNOSIS — Z87891 Personal history of nicotine dependence: Secondary | ICD-10-CM | POA: Diagnosis not present

## 2021-10-18 DIAGNOSIS — E538 Deficiency of other specified B group vitamins: Secondary | ICD-10-CM | POA: Diagnosis not present

## 2021-10-18 DIAGNOSIS — Z8546 Personal history of malignant neoplasm of prostate: Secondary | ICD-10-CM | POA: Diagnosis not present

## 2021-10-18 MED ORDER — PROCHLORPERAZINE MALEATE 10 MG PO TABS: 10.0000 mg | ORAL_TABLET | Freq: Four times a day (QID) | ORAL | 0 refills | Status: DC | PRN

## 2021-10-18 MED ORDER — CYANOCOBALAMIN 1000 MCG/ML IJ SOLN
1000.0000 ug | Freq: Once | INTRAMUSCULAR | Status: AC
  Administered 2021-10-18: 12:00:00 1000 ug via INTRAMUSCULAR
  Filled 2021-10-18: qty 1

## 2021-10-18 MED ORDER — FOLIC ACID 1 MG PO TABS: 1.0000 mg | ORAL_TABLET | Freq: Every day | ORAL | 4 refills | Status: DC

## 2021-10-18 NOTE — Progress Notes (Signed)
START OFF PATHWAY REGIMEN - Non-Small Cell Lung   [id number]:Carboplatin AUC=5 + Pemetrexed 500 mg/m2 q21 Days:   A cycle is every 21 days:     Pemetrexed      Carboplatin   **Always confirm dose/schedule in your pharmacy ordering system**  Patient Characteristics: Stage IV Metastatic, Nonsquamous, Did Not Order Molecular Analysis/Quantity Not Sufficient for Molecular Analysis Therapeutic Status: Stage IV Metastatic Histology: Nonsquamous Cell Broad Molecular Profiling Status: Quantity Not Sufficient for Molecular Analysis  Intent of Therapy: Non-Curative / Palliative Intent, Discussed with Patient

## 2021-10-18 NOTE — Progress Notes (Signed)
[place] [phone]   [phone]  OFFICE PROGRESS NOTE  Casilda Carls, MD [place] [address]  DIAGNOSIS: 1) stage IV (T1c, N0, M1 C) non-small cell lung cancer favoring adenocarcinoma presented with right lung apical nodule in addition to metastatic disease in the right hepatic lobe and the proximal right femoral diaphysis diagnosed and December 2022. 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  First dose expected on November 01, 2021 after the availability of the molecular studies.  INTERVAL HISTORY: Chris [name] 60 y.o. male returns to the clinic today for follow-up visit accompanied by his wife.  The patient is feeling fine today with no concerning complaints except for mild fatigue.  He had several studies performed since his initial visit including bronchoscopy with biopsy of the right upper lobe lung nodule in addition to MRI of the brain and MRI of the liver.  He is here for evaluation and discussion of his treatment options.  He has no current chest pain but has shortness of breath with exertion with mild cough and no hemoptysis.  He has no nausea, vomiting, diarrhea or constipation.  He has no headache or visual changes.  He has no recent weight loss or night sweats.  MEDICAL HISTORY: Past Medical History:  Diagnosis Date   Anxiety    associated with medical care,  worsened by difficulty hearing, does better with wife present   Complication of anesthesia    wife states very anxious may need pre sedation   Coronary artery disease    GERD (gastroesophageal reflux disease)    Hearing loss    mild per wife   Hearing loss    no hearing aids per wife   Hyperlipidemia     Hypertension    MI (myocardial infarction) ([place]) 04/17/2006   Acute inferolateral wall MI with cardiogenic shock and complete heart block   Prostate cancer ([place])    Tobacco abuse    Wears glasses    Wears partial dentures    Upper    ALLERGIES:  has No Known Allergies.  MEDICATIONS:  Current Outpatient Medications  Medication Sig Dispense Refill   ALPRAZolam (XANAX) 0.25 MG tablet Take 1 tablet (0.25 mg total) by mouth at bedtime. 15 tablet 0   amoxicillin-clavulanate (AUGMENTIN) 875-125 MG tablet Take 1 tablet by mouth 2 (two) times daily. 14 tablet 0   atorvastatin (LIPITOR) 20 MG tablet TAKE 1 TABLET BY MOUTH EVERY DAY 90 tablet 3   clopidogrel (PLAVIX) 75 MG tablet Take 1 tablet (75 mg total) by mouth daily. TAKE 1 TABLET (75 MG TOTAL) BY MOUTH DAILY. Okay to restart this medication on 10/10/2021. 90 tablet 3   ketoconazole (NIZORAL) 2 % cream Apply 1 application topically 2 (two) times daily as needed for irritation.     metoprolol tartrate (LOPRESSOR) 25 MG tablet Take 1 tablet (25 mg total) by mouth daily. 30 tablet 11   omeprazole (PRILOSEC OTC) 20 MG tablet Take 20 mg by mouth daily.     tamsulosin (FLOMAX) 0.4 MG CAPS capsule Take 1 capsule (0.4 mg total) by mouth daily. 30 capsule 3   No current facility-administered medications for this visit.    SURGICAL HISTORY:  Past Surgical History:  Procedure Laterality  Date   BRONCHIAL BIOPSY  10/09/2021   Procedure: BRONCHIAL BIOPSIES;  Surgeon: Collene Gobble, MD;  Location[facility] END[facility]COPY;  Service: Pulmonary;;   BRONCHIAL BRUSHINGS  10/09/2021   Procedure: BRONCHIAL BRUSHINGS;  Surgeon: Collene Gobble, MD;  Location: [facility] ENDOSCOPY;  Service: Pulmonary;;   BRONCHIAL NEEDLE ASPIRATION BIOPSY  10/09/2021   Procedure: BRONCHIAL NEEDLE ASPIRATION BIOPSIES;  Surgeon: Collene Gobble, MD;  Location: [facility] ENDOSCOPY;  Service: Pulmonary;;   CARDIAC CATHETERIZATION  2007   left, RCA 100% occluded ruptured plaque with thrombus in the  proximal segment   CYST EXCISION N/A 08/30/2021   Procedure: EXCISION OF POSTERIOR SCALP CYST;  Surgeon: Clovis Riley, MD;  Location: [facility] ORS;  Service: General;  Laterality: N/A;   CYSTOSCOPY N/A 07/17/2018   Procedure: Consuela Mimes;  Surgeon: Franchot Gallo, MD;  Location: [facility];  Service: Urology;  Laterality: N/A;  no seeds in bladder per Dr Diona Fanti   FIDUCIAL MARKER PLACEMENT  10/09/2021   Procedure: FIDUCIAL MARKER PLACEMENT;  Surgeon: Collene Gobble, MD;  Location: [facility] ENDOSCOPY;  Service: Pulmonary;;   PROSTATE BIOPSY     RADIOACTIVE SEED IMPLANT N/A 07/17/2018   Procedure: RADIOACTIVE SEED IMPLANT/BRACHYTHERAPY IMPLANT;  Surgeon: Franchot Gallo, MD;  Location: [facility];  Service: Urology;  Laterality: N/A;  77 seeds   SPACE OAR INSTILLATION N/A 07/17/2018   Procedure: SPACE OAR INSTILLATION;  Surgeon: Franchot Gallo, MD;  Location: [facility];  Service: Urology;  Laterality: N/A;   VIDEO BRONCHOSCOPY WITH RADIAL ENDOBRONCHIAL ULTRASOUND  10/09/2021   Procedure: VIDEO BRONCHOSCOPY WITH RADIAL ENDOBRONCHIAL ULTRASOUND;  Surgeon: Collene Gobble, MD;  Location: [facility] ENDOSCOPY;  Service: Pulmonary;;   WRIST SURGERY  10/04/2012    REVIEW OF SYSTEMS:  Constitutional: positive for fatigue Eyes: negative Ears, nose, mouth, throat, and face: negative Respiratory: positive for cough and dyspnea on exertion Cardiovascular: negative Gastrointestinal: negative Genitourinary:negative Integument/breast: negative Hematologic/lymphatic: negative Musculoskeletal:negative Neurological: negative Behavioral/Psych: negative Endocrine: negative Allergic/Immunologic: negative   PHYSICAL EXAMINATION: General appearance: alert, cooperative, fatigued, and no distress Head: Normocephalic, without obvious abnormality, atraumatic Neck: no adenopathy, no JVD, supple, symmetrical, trachea midline, and thyroid not enlarged, symmetric, no  tenderness/mass/nodules Lymph nodes: Cervical, supraclavicular, and axillary nodes normal. Resp: clear to auscultation bilaterally Back: symmetric, no curvature. ROM normal. No CVA tenderness. Cardio: regular rate and rhythm, S1, S2 normal, no murmur, click, rub or gallop GI: soft, non-tender; bowel sounds normal; no masses,  no organomegaly Extremities: extremities normal, atraumatic, no cyanosis or edema Neurologic: Alert and oriented X 3, normal strength and tone. Normal symmetric reflexes. Normal coordination and gait  ECOG PERFORMANCE STATUS: 1 - Symptomatic but completely ambulatory  Blood pressure 124/90, pulse 64, temperature (!) 97 F (36.1 C), temperature source Tympanic, resp. rate 19, height 5\' 11"  (1.803 m), weight 242 lb 9.6 oz (110 kg), SpO2 99 %.  LABORATORY DATA: Lab Results  Component Value Date   WBC 7.9 10/11/2021   HGB 16.3 10/11/2021   HCT 47.5 10/11/2021   MCV 87.3 10/11/2021   PLT 229 10/11/2021      Chemistry      Component Value Date/Time   NA 142 10/11/2021 1257   K 3.9 10/11/2021 1257   CL 107 10/11/2021 1257   CO2 25 10/11/2021 1257   BUN 17 10/11/2021 1257   CREATININE 1.07 10/11/2021 1257      Component Value Date/Time   CALCIUM 8.8 (L) 10/11/2021 1257   ALKPHOS 75 10/11/2021 1257   AST  18 10/11/2021 1257   ALT 26 10/11/2021 1257   BILITOT 0.6 10/11/2021 1257       RADIOGRAPHIC STUDIES: CT Soft Tissue Neck W Contrast  Result Date: 09/20/2021 CLINICAL DATA:  Squamous cell carcinoma scalp. Question left posterior cervical lymph node. EXAM: CT NECK WITH CONTRAST TECHNIQUE: Multidetector CT imaging of the neck was performed using the standard protocol following the bolus administration of intravenous contrast. CONTRAST:  68mL OMNIPAQUE IOHEXOL 350 MG/ML SOLN COMPARISON:  None. FINDINGS: Pharynx and larynx: Normal. No mass or swelling. Salivary glands: No inflammation, mass, or stone. Thyroid: Negative Lymph nodes: Possible abnormality in the  left posterior scalp was not localized by the technologist. No suspicious lymph node is seen in the left posterior scalp. 7.5 mm lymph node in the right posterior scalp at the C3 level. This has ill-defined margins and is indeterminate but could represent metastatic disease. No pathologically enlarged lymph nodes are seen in the anterior neck. Vascular: Normal vascular enhancement. Mild atherosclerotic disease in the carotid arteries bilaterally. Limited intracranial: Negative Visualized orbits: Not imaged Mastoids and visualized paranasal sinuses: Large retention cyst left maxillary sinus. Remaining sinuses clear. Right mastoid effusion. Skeleton: No evidence of skeletal metastasis. Mild compression fracture T4 and T5 which are probably chronic. Upper chest: 17 mm nodule right upper lobe with spiculated margins. Other: None IMPRESSION: 1. 7.5 mm posterior lymph node on the right which is indeterminate but could be due to metastatic disease. Consider biopsy. No enlarged lymph nodes in the left posterior neck or scalp. 2. 17 mm spiculated mass right upper lobe, suspicious for carcinoma lung. Recommend CT chest with contrast for further evaluation. Electronically Signed   By: Franchot Gallo M.D.   On: 09/20/2021 15:22   MR BRAIN W WO CONTRAST  Result Date: 10/15/2021 CLINICAL DATA:  Non-small cell lung cancer.  Staging. EXAM: MRI HEAD WITHOUT AND WITH CONTRAST TECHNIQUE: Multiplanar, multiecho pulse sequences of the brain and surrounding structures were obtained without and with intravenous contrast. CONTRAST:  102mL GADAVIST GADOBUTROL 1 MMOL/ML IV SOLN COMPARISON:  Head CT 12/12/2015 FINDINGS: Brain: Diffusion imaging does not show any acute or subacute infarction or other cause of restricted diffusion. No abnormality affects the brainstem or cerebellum. Cerebral hemispheres show a few punctate foci of T2 and FLAIR signal in the white matter, often seen at this age. No cortical or large vessel territory  infarction. No evidence of primary or metastatic mass lesion, hemorrhage, hydrocephalus or extra-axial collection. After contrast administration, no abnormal enhancement occurs. Vascular: Major vessels at the base of the brain show flow. Skull and upper cervical spine: Negative Sinuses/Orbits: Retention cyst in the left maxillary sinus. Mastoid effusion on the right. Other: None IMPRESSION: No evidence of metastatic disease. Few punctate foci of T2 and FLAIR signal in the hemispheric white matter, consistent with minimal small vessel change. Right mastoid effusion, extensive.  No causative feature identified. Electronically Signed   By: Nelson Chimes M.D.   On: 10/15/2021 16:57   MR LIVER W WO CONTRAST  Result Date: 10/06/2021 CLINICAL DATA:  Squamous cell carcinoma. Liver lesion identified on PET-CT scan. MRI recommended for further characterization. Hypermetabolic lung nodule additionally EXAM: MRI ABDOMEN WITHOUT AND WITH CONTRAST TECHNIQUE: Multiplanar multisequence MR imaging of the abdomen was performed both before and after the administration of intravenous contrast. CONTRAST:  64mL GADAVIST GADOBUTROL 1 MMOL/ML IV SOLN COMPARISON:  PET-CT scan 09/28/2021, CT chest 10/06/2021 FINDINGS: Lower chest:  Lung bases are clear. Hepatobiliary: Within the RIGHT hepatic lobe, there is a  peripheral enhancing lesion which corresponds to the hypermetabolic lesion on comparison FDG PET scan. Lesion measures 16 mm by 16 mm on image 49/series 16. Lesion has continuous peripheral enhancement typical of metastatic lesion. No additional lesions are present in the liver. Normal gallbladder and biliary tree. Pancreas: Normal pancreatic parenchymal intensity. No ductal dilatation or inflammation. Spleen: Normal spleen. Adrenals/urinary tract: Loss of signal intensity in the adrenal glands consistent benign bilateral adrenal adenomas. Stomach/Bowel: thickening of the gastric mucosa to 2.8 cm (image 15/4). No abnormal metabolic  activity on comparison FDG PET scan. Vascular/Lymphatic: Abdominal aortic normal caliber. No retroperitoneal periportal lymphadenopathy. Musculoskeletal: No aggressive osseous lesion IMPRESSION: 1. Peripheral enhancing lesion in the RIGHT hepatic lobe which corresponds to hypermetabolic lesion on comparison FDG PET scan is consistent with solitary hepatic metastasis. 2. Bilateral benign adrenal adenomas. Electronically Signed   By: Suzy Bouchard M.D.   On: 10/06/2021 10:36   NM PET Image Initial (PI) Skull Base To Thigh  Result Date: 09/29/2021 CLINICAL DATA:  Initial treatment strategy for squamous cell carcinoma of the scalp. Right apical pulmonary nodule and posterior cervical adenopathy on CT. EXAM: NUCLEAR MEDICINE PET SKULL BASE TO THIGH TECHNIQUE: 12.3 mCi F-18 FDG was injected intravenously. Full-ring PET imaging was performed from the skull base to thigh after the radiotracer. CT data was obtained and used for attenuation correction and anatomic localization. Fasting blood glucose: 97 mg/dl COMPARISON:  CT neck 09/20/2021. FINDINGS: Mediastinal blood pool activity: SUV max 2.3 NECK: Imaging of the head and neck is limited by motion artifact and resulting misregistration between the PET and CT images. There is hypermetabolic activity in the central occipital scalp (SUV max 5.8) which may correspond with the patient's known scalp lesion. The previously demonstrated small right occipital lymph node is mildly hypermetabolic (SUV max 2.9). No other hypermetabolic cervical lymph nodes are identified.Mucosal assessment limited due to the motion. Activity within the lymphoid tissue of Waldeyer's ring is grossly unremarkable. Incidental CT findings: Left maxillary sinus retention cysts and bilateral carotid atherosclerosis. CHEST: There are no hypermetabolic mediastinal, hilar or axillary lymph nodes. The previously demonstrated irregular right apical lesion is hypermetabolic. This lesion measures 2.4 x 1.3  cm on image 11/8 and has an SUV max of 5.2, suspicious for malignancy. No other hypermetabolic pulmonary activity or suspicious nodularity. Incidental CT findings: Mild atherosclerosis of the aorta, great vessels and coronary arteries. ABDOMEN/PELVIS: Focal hypermetabolic activity inferiorly in the right hepatic lobe, corresponding with a 1.8 x 1.5 cm lesion in segment 5 (image 146/4). This has an SUV max of 5.7. No other hypermetabolic activity identified in the liver, spleen, pancreas or adrenal glands. There is no hypermetabolic nodal activity. Incidental CT findings: Mild low-density enlargement of both adrenal glands without hypermetabolic activity, consistent with hyperplasia or small adenomas. Seed implants are noted within the prostate gland. Mild aortic and branch vessel atherosclerosis. SKELETON: There is a focal hypermetabolic lesion within the proximal right femoral diaphysis (SUV max 7.4). This corresponds with an area of increased density within the medullary cavity on CT image 237/4. No other hypermetabolic osseous activity. Incidental CT findings: none IMPRESSION: 1. The spiculated nodule at the right lung apex on recent neck CT is hypermetabolic and is concerning for primary bronchogenic carcinoma. Tissue sampling recommended. 2. Hypermetabolic activity within the occipital scalp and small previously demonstrated right occipital lymph node compatible with known squamous cell carcinoma. Evaluation of the head and neck limited by motion artifact. 3. Hypermetabolic lesions inferiorly in the right hepatic lobe and in the  proximal right femoral diaphysis suspicious for metastatic disease, primary uncertain in this patient with a history of prostate cancer. Correlate with PSA levels. Abdominal MRI without and with contrast may be helpful for further characterization of the liver lesion. Electronically Signed   By: Richardean Sale M.D.   On: 09/29/2021 13:54   DG Chest Port 1 View  Result Date:  10/09/2021 CLINICAL DATA:  Post bronchoscopy. EXAM: PORTABLE CHEST 1 VIEW COMPARISON:  01/05/2021 and CT chest 10/06/2021. FINDINGS: Trachea is midline. Heart is enlarged. A fiducial marker is seen at the site of a known right upper lobe nodule. No definite pneumothorax status post bronchoscopic biopsy. There may be streaky atelectasis in both lung bases. No airspace consolidation. No pleural fluid. IMPRESSION: 1. Fiducial marker localizes a known right upper lobe nodule. No definite pneumothorax after bronchoscopic biopsy. 2. Bibasilar streaky atelectasis. Electronically Signed   By: Lorin Picket M.D.   On: 10/09/2021 13:09   DG C-Arm 1-60 Min-No Report  Result Date: 10/09/2021 Fluoroscopy was utilized by the requesting physician.  No radiographic interpretation.   CT Super D Chest Wo Contrast  Result Date: 10/06/2021 CLINICAL DATA:  Lung mass.  Squamous cell carcinoma. EXAM: CT CHEST WITHOUT CONTRAST TECHNIQUE: Multidetector CT imaging of the chest was performed using thin slice collimation for electromagnetic bronchoscopy planning purposes, without intravenous contrast. COMPARISON:  PET-CT scan 09/28/2021 FINDINGS: Cardiovascular: Coronary artery calcification and aortic atherosclerotic calcification. Mediastinum/Nodes: No axillary or supraclavicular adenopathy. No mediastinal or hilar adenopathy. No pericardial fluid. Esophagus normal. Lungs/Pleura: Spiculated nodule in the RIGHT lung apex measures 2.6 x 1.5 cm (image 24/CT series 3). This nodule is hypermetabolic on comparison FDG PET scan. No additional pulmonary nodules identified. Upper Abdomen: Low-attenuation lesion in the RIGHT hepatic lobe corresponds to hypermetabolic lesion on comparison FDG PET scan. Low-attenuation of the adrenal glands consistent with adenomas or hyperplasia. Musculoskeletal: No aggressive osseous lesion. IMPRESSION: 1. Spiculated mass in the RIGHT upper lobe remains concerning for malignancy. 2. Low-density lesion in  the RIGHT hepatic lobe corresponds to hypermetabolic lesion on comparison PET-CT scan and concerning for metastatic liver lesion. 3. Adrenal hyperplasia versus adenomas. Electronically Signed   By: Suzy Bouchard M.D.   On: 10/06/2021 10:22   DG C-ARM BRONCHOSCOPY  Result Date: 10/09/2021 C-ARM BRONCHOSCOPY: Fluoroscopy was utilized by the requesting physician.  No radiographic interpretation.    ASSESSMENT AND PLAN: This is a very pleasant 60 years old white male diagnosed with a stage IV (T1c, N0, M1 C) non-small cell lung cancer favoring adenocarcinoma presented with right upper lobe lung nodule in addition to solitary liver metastasis and solitary bone metastasis in the proximal right femoral diaphysis diagnosed in December 2022.  There was insufficient material for molecular studies. The patient also has a history of squamous cell carcinoma of the posterior scalp status post excision. I had a lengthy discussion with the patient and his wife who has a lot of questions today about his condition and prognosis with and without treatment as well as treatment options. I recommended for the patient to proceed with blood test for molecular studies by [employer]. I discussed with the patient his treatment options including palliative care and hospice referral versus consideration of palliative systemic chemotherapy with carboplatin for AUC of 5, Alimta 500 Mg/M2 and Libtayo (Cempilimab) 350 Mg/M2 according to the recently approved indication for Libtayo (Cempilimab) from the clinical trial of [facility] 3.  The patient would benefit from treatment with targeted therapy if the molecular study showed an actionable  mutation but these results are not available for now and likely to be available before restarting the first cycle of his treatment. The patient and his wife are interested in proceeding with systemic treatment and they are accepting systemic chemotherapy plus immunotherapy if he has no  actionable mutation. I discussed with him the adverse effect of this treatment including but not limited to alopecia, myelosuppression, nausea and vomiting, peripheral neuropathy, liver or renal dysfunction as well as immunotherapy adverse effects. The patient will have a chemotherapy education class before the first dose of his treatment. I will arrange for him to receive vitamin B12 injection today.  I will call his pharmacy with prescription for Compazine 10 mg p.o. every 6 hours as needed for nausea and folic acid 1 mg p.o. daily. The patient will have a chemotherapy education class before the first dose of his treatment. He is also seen by Dr. Lisbeth Renshaw and expected to undergo SBRT to the lung lesion as well as palliative radiotherapy to the right femoral lesion. The patient will come back for follow-up visit on the first day of his treatment for discussion of the molecular study before proceeding with systemic therapy. He was advised to call immediately if he has any other concerning symptoms in the interval. The patient voices understanding of current disease status and treatment options and is in agreement with the current care plan.  All questions were answered. The patient knows to call the clinic with any problems, questions or concerns. We can certainly see the patient much sooner if necessary.  The total time spent in the appointment was 55 minutes.  Disclaimer: This note was dictated with voice recognition software. Similar sounding words can inadvertently be transcribed and may not be corrected upon review.

## 2021-10-19 ENCOUNTER — Ambulatory Visit: Payer: BC Managed Care – PPO

## 2021-10-19 ENCOUNTER — Inpatient Hospital Stay (HOSPITAL_BASED_OUTPATIENT_CLINIC_OR_DEPARTMENT_OTHER): Payer: BC Managed Care – PPO | Admitting: Physician Assistant

## 2021-10-19 ENCOUNTER — Telehealth: Payer: Self-pay | Admitting: General Practice

## 2021-10-19 ENCOUNTER — Telehealth: Payer: Self-pay | Admitting: Medical Oncology

## 2021-10-19 ENCOUNTER — Telehealth: Payer: Self-pay

## 2021-10-19 VITALS — BP 131/82 | HR 71 | Temp 98.1°F | Resp 18 | Wt 247.1 lb

## 2021-10-19 DIAGNOSIS — Z8546 Personal history of malignant neoplasm of prostate: Secondary | ICD-10-CM | POA: Diagnosis not present

## 2021-10-19 DIAGNOSIS — Z79899 Other long term (current) drug therapy: Secondary | ICD-10-CM | POA: Diagnosis not present

## 2021-10-19 DIAGNOSIS — C7951 Secondary malignant neoplasm of bone: Secondary | ICD-10-CM | POA: Diagnosis not present

## 2021-10-19 DIAGNOSIS — E538 Deficiency of other specified B group vitamins: Secondary | ICD-10-CM | POA: Diagnosis not present

## 2021-10-19 DIAGNOSIS — R221 Localized swelling, mass and lump, neck: Secondary | ICD-10-CM | POA: Diagnosis not present

## 2021-10-19 DIAGNOSIS — C3411 Malignant neoplasm of upper lobe, right bronchus or lung: Secondary | ICD-10-CM | POA: Diagnosis not present

## 2021-10-19 MED ORDER — DOXYCYCLINE HYCLATE 100 MG PO TABS: 100.0000 mg | ORAL_TABLET | Freq: Two times a day (BID) | ORAL | 0 refills | Status: AC

## 2021-10-19 NOTE — Patient Instructions (Signed)
Call [facility] to get an appointment for evaluation of the swelling of your neck.  Prescription sent to [facility] to cover for possible skin infection.   If symptoms worsen go to the Emergency Department

## 2021-10-19 NOTE — Telephone Encounter (Signed)
Spoke w/ spouse Mrs.Elizabeth Gutknecht who is cleared to speak on behalf of the patient and identified w/ 2 identifiers. She states "Chris [name] lymph node has tripled in size over night and he can no longer turn his head". She is insistent that he be seen by someone today, either Dr. Curt Bears or Shona Simpson, PA-C. The ER or virtual is not an option that she is willing to explore. I told Mrs. Calzada that I would deliver this information to Dr. Worthy Flank team as well as Alison's team and that we would do our best to facilitate her request. I left my extension [phone] for her to call with any additional questions.

## 2021-10-19 NOTE — Telephone Encounter (Signed)
[facility] CSW Progress Notes  Called wife at request of A Dara Lords, states they have questions about using a cancer policy and disability.  Called wife, they are waiting to see physician, she would like a call back later.   Edwyna Shell, LCSW Clinical Social Worker Phone:  [phone]

## 2021-10-19 NOTE — Progress Notes (Signed)
Symptom Management Consult note [place]    Patient Care Team: Casilda Carls, MD as PCP - General (Internal Medicine) Lorretta Harp, MD as PCP - Cardiology (Cardiology) Valrie Hart, RN as Oncology Nurse Navigator (Oncology)    Name of the patient: Chris [name]  [id number]  11/03/1960   Date of visit: 10/19/2021    Chief complaint/ Reason for visit- lump on neck  Oncology History  Primary adenocarcinoma of upper lobe of right lung ([place])  10/18/2021 Initial Diagnosis   Primary adenocarcinoma of upper lobe of right lung ([place])   10/18/2021 Cancer Staging   Staging form: Lung, AJCC 8th Edition - Clinical: Stage IVB (cT1c, cN0, cM1c) - Signed by Curt Bears, MD on 10/18/2021    11/01/2021 -  Chemotherapy   Patient is on Treatment Plan : LUNG NSCLC Pemetrexed + Carboplatin q21d x 4 Cycles       Current Therapy: not yet started  Interval history- Chris [name] is a 60 yo male with history of primary adenocarcinoma of right upper lobe of right lung diagnosed in December  2022 and 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  Patient is accompanied by his spouse.  Patient states last night he noticed a small bump on the back of his neck on the right side.  When he woke up this morning it had grown in size, he estimates 3x the size it was when he went to bed.  He admits the area is tender to touch and sore when he moves his neck.  He is unable to further describe the pain.  He rates it 7 out of 10 in severity.  He took an ibuprofen this morning with minimal symptom improvement.  He states this is how the area on his occipital scalp first started and he is concerned about spread of cancer.  Patient is currently taking Augmentin for possible infection after bronchoscopy.  He denies any fever, chills, weight loss,  night sweats, difficulty swallowing or eating, cough, hemoptysis, hematemesis, rash.      ROS  All other systems are reviewed and are negative for acute change except as noted in the HPI.    No Known Allergies   Past Medical History:  Diagnosis Date   Anxiety    associated with medical care,  worsened by difficulty hearing, does better with wife present   Complication of anesthesia    wife states very anxious may need pre sedation   Coronary artery disease    GERD (gastroesophageal reflux disease)    Hearing loss    mild per wife   Hearing loss    no hearing aids per wife   Hyperlipidemia    Hypertension    MI (myocardial infarction) ([place]) 04/17/2006   Acute inferolateral wall MI with cardiogenic shock and complete heart block   Prostate cancer ([place])    Tobacco abuse    Wears glasses    Wears partial dentures    Upper     Past Surgical History:  Procedure Laterality Date   BRONCHIAL BIOPSY  10/09/2021   Procedure: BRONCHIAL BIOPSIES;  Surgeon: Collene Gobble, MD;  Location: [facility];  Service: Pulmonary;;   BRONCHIAL BRUSHINGS  10/09/2021   Procedure: BRONCHIAL BRUSHINGS;  Surgeon: Collene Gobble, MD;  Location: [facility] ENDOSCOPY;  Service: Pulmonary;;   BRONCHIAL NEEDLE ASPIRATION BIOPSY  10/09/2021   Procedure: BRONCHIAL NEEDLE ASPIRATION BIOPSIES;  Surgeon: Collene Gobble, MD;  Location: [facility] ENDOSCOPY;  Service: Pulmonary;;   CARDIAC CATHETERIZATION  2007   left, RCA 100% occluded ruptured plaque with thrombus in the proximal segment   CYST EXCISION N/A 08/30/2021   Procedure: EXCISION OF POSTERIOR SCALP CYST;  Surgeon: Clovis Riley, MD;  Location: [facility];  Service: General;  Laterality: N/A;   CYSTOSCOPY N/A 07/17/2018   Procedure: Consuela Mimes;  Surgeon: Franchot Gallo, MD;  Location: [facility];  Service: Urology;  Laterality: N/A;  no seeds in bladder per Dr Diona Fanti   FIDUCIAL MARKER PLACEMENT  10/09/2021   Procedure: FIDUCIAL MARKER  PLACEMENT;  Surgeon: Collene Gobble, MD;  [facility] END[facility]COPY;  Service: Pulmonary;;   PROSTATE BIOPSY     RADIOACTIVE SEED IMPLANT N/A 07/17/2018   Procedure: RADIOACTIVE SEED IMPLANT/BRACHYTHERAPY IMPLANT;  Surgeon: Franchot Gallo, MD;  Location: [facility];  Service: Urology;  Laterality: N/A;  77 seeds   SPACE OAR INSTILLATION N/A 07/17/2018   Procedure: SPACE OAR INSTILLATION;  Surgeon: Franchot Gallo, MD;  Location: [facility];  Service: Urology;  Laterality: N/A;   VIDEO BRONCHOSCOPY WITH RADIAL ENDOBRONCHIAL ULTRASOUND  10/09/2021   Procedure: VIDEO BRONCHOSCOPY WITH RADIAL ENDOBRONCHIAL ULTRASOUND;  Surgeon: Collene Gobble, MD;  Location: [facility] ENDOSCOPY;  Service: Pulmonary;;   WRIST SURGERY  10/04/2012    Social History   Socioeconomic History   Marital status: Married    Spouse name: Not on file   Number of children: Not on file   Years of education: Not on file   Highest education level: Not on file  Occupational History   Not on file  Tobacco Use   Smoking status: Every Day    Packs/day: 1.50    Years: 42.00    Pack years: 63.00    Types: Cigarettes   Smokeless tobacco: Never  Vaping Use   Vaping Use: Never used  Substance and Sexual Activity   Alcohol use: No   Drug use: No   Sexual activity: Yes    Birth control/protection: None  Other Topics Concern   Not on file  Social History Narrative   10-04 19 Unable to ask abuse questions wife with him today.   Social Determinants of Health   Financial Resource Strain: Not on file  Food Insecurity: Not on file  Transportation Needs: Not on file  Physical Activity: Not on file  Stress: Not on file  Social Connections: Not on file  Intimate Partner Violence: Not on file    Family History  Problem Relation Age of Onset   Prostate cancer Neg Hx    Kidney cancer Neg Hx    Cancer Neg Hx      Current Outpatient Medications:    ALPRAZolam (XANAX) 0.25 MG tablet, Take 1  tablet (0.25 mg total) by mouth at bedtime., Disp: 15 tablet, Rfl: 0   amoxicillin-clavulanate (AUGMENTIN) 875-125 MG tablet, Take 1 tablet by mouth 2 (two) times daily., Disp: 14 tablet, Rfl: 0   atorvastatin (LIPITOR) 20 MG tablet, TAKE 1 TABLET BY MOUTH EVERY DAY, Disp: 90 tablet, Rfl: 3   clopidogrel (PLAVIX) 75 MG tablet, Take 1 tablet (75 mg total) by mouth daily. TAKE 1 TABLET (75 MG TOTAL) BY MOUTH DAILY. Okay to restart this medication on 10/10/2021., Disp: 90 tablet, Rfl: 3   doxycycline (VIBRA-TABS) 100 MG tablet, Take 1 tablet (100 mg total) by mouth 2 (two) times daily for 7  days., Disp: 14 tablet, Rfl: 0   folic acid (FOLVITE) 1 MG tablet, Take 1 tablet (1 mg total) by mouth daily., Disp: 30 tablet, Rfl: 4   ketoconazole (NIZORAL) 2 % cream, Apply 1 application topically 2 (two) times daily as needed for irritation., Disp: , Rfl:    metoprolol tartrate (LOPRESSOR) 25 MG tablet, Take 1 tablet (25 mg total) by mouth daily., Disp: 30 tablet, Rfl: 11   omeprazole (PRILOSEC OTC) 20 MG tablet, Take 20 mg by mouth daily., Disp: , Rfl:    prochlorperazine (COMPAZINE) 10 MG tablet, Take 1 tablet (10 mg total) by mouth every 6 (six) hours as needed for nausea or vomiting., Disp: 30 tablet, Rfl: 0   tamsulosin (FLOMAX) 0.4 MG CAPS capsule, Take 1 capsule (0.4 mg total) by mouth daily., Disp: 30 capsule, Rfl: 3  PHYSICAL EXAM: ECOG FS:1 - Symptomatic but completely ambulatory    Vitals:   10/19/21 1145  BP: 131/82  Pulse: 71  Resp: 18  Temp: 98.1 F (36.7 C)  TempSrc: Oral  SpO2: 97%  Weight: 247 lb 1.6 oz (112.1 kg)   Physical Exam Vitals and nursing note reviewed.  Constitutional:      Appearance: He is well-developed. He is not ill-appearing or toxic-appearing.  HENT:     Head: Normocephalic and atraumatic.     Right Ear: External ear normal.     Left Ear: External ear normal.     Nose: Nose normal.  Eyes:     General: No scleral icterus.       Right eye: No discharge.         Left eye: No discharge.     Conjunctiva/sclera: Conjunctivae normal.  Neck:     Vascular: No JVD.     Comments: See media below. Patient gave verbal permission to utilize photo for medical documentation only   Right occipital lymph node is indurated measuring 5 x 3 cm. Faint overlying erythema. No palpable fluctuance. Area is not mobile.   Occipital scalp incision appears well healed and without signs of infection   Cardiovascular:     Rate and Rhythm: Normal rate and regular rhythm.     Pulses: Normal pulses.     Heart sounds: Normal heart sounds.  Pulmonary:     Effort: Pulmonary effort is normal.     Breath sounds: Normal breath sounds.  Abdominal:     General: There is no distension.  Musculoskeletal:        General: Normal range of motion.     Cervical back: Normal range of motion.     Right lower leg: No edema.     Left lower leg: No edema.  Skin:    General: Skin is warm and dry.     Capillary Refill: Capillary refill takes less than 2 seconds.  Neurological:     Mental Status: He is oriented to person, place, and time.     GCS: GCS eye subscore is 4. GCS verbal subscore is 5. GCS motor subscore is 6.     Comments: Fluent speech, no facial droop.  Psychiatric:        Behavior: Behavior normal.        LABORATORY DATA: I have reviewed the data as listed CBC Latest Ref Rng & Units 10/11/2021 10/09/2021 08/28/2021  WBC 4.0 - 10.5 K/uL 7.9 7.3 7.7  Hemoglobin 13.0 - 17.0 g/dL 16.3 16.8 16.5  Hematocrit 39.0 - 52.0 % 47.5 51.7 49.3  Platelets 150 - 400 K/uL 229 221  240     CMP Latest Ref Rng & Units 10/11/2021 10/09/2021 08/28/2021  Glucose 70 - 99 mg/dL 89 94 110(H)  BUN 6 - 20 mg/dL 17 19 15   Creatinine 0.61 - 1.24 mg/dL 1.07 0.96 0.90  Sodium 135 - 145 mmol/L 142 137 137  Potassium 3.5 - 5.1 mmol/L 3.9 4.3 3.9  Chloride 98 - 111 mmol/L 107 109 106  CO2 22 - 32 mmol/L 25 16(L) 24  Calcium 8.9 - 10.3 mg/dL 8.8(L) 8.7(L) 8.7(L)  Total Protein 6.5 - 8.1  g/dL 7.2 - -  Total Bilirubin 0.3 - 1.2 mg/dL 0.6 - -  Alkaline Phos 38 - 126 U/L 75 - -  AST 15 - 41 U/L 18 - -  ALT 0 - 44 U/L 26 - -       RADIOGRAPHIC STUDIES: I have personally reviewed the radiological images as listed and agreed with the findings in the report. No images are attached to the encounter. CT Soft Tissue Neck W Contrast  Result Date: 09/20/2021 CLINICAL DATA:  Squamous cell carcinoma scalp. Question left posterior cervical lymph node. EXAM: CT NECK WITH CONTRAST TECHNIQUE: Multidetector CT imaging of the neck was performed using the standard protocol following the bolus administration of intravenous contrast. CONTRAST:  33mL OMNIPAQUE IOHEXOL 350 MG/ML SOLN COMPARISON:  None. FINDINGS: Pharynx and larynx: Normal. No mass or swelling. Salivary glands: No inflammation, mass, or stone. Thyroid: Negative Lymph nodes: Possible abnormality in the left posterior scalp was not localized by the technologist. No suspicious lymph node is seen in the left posterior scalp. 7.5 mm lymph node in the right posterior scalp at the C3 level. This has ill-defined margins and is indeterminate but could represent metastatic disease. No pathologically enlarged lymph nodes are seen in the anterior neck. Vascular: Normal vascular enhancement. Mild atherosclerotic disease in the carotid arteries bilaterally. Limited intracranial: Negative Visualized orbits: Not imaged Mastoids and visualized paranasal sinuses: Large retention cyst left maxillary sinus. Remaining sinuses clear. Right mastoid effusion. Skeleton: No evidence of skeletal metastasis. Mild compression fracture T4 and T5 which are probably chronic. Upper chest: 17 mm nodule right upper lobe with spiculated margins. Other: None IMPRESSION: 1. 7.5 mm posterior lymph node on the right which is indeterminate but could be due to metastatic disease. Consider biopsy. No enlarged lymph nodes in the left posterior neck or scalp. 2. 17 mm spiculated mass  right upper lobe, suspicious for carcinoma lung. Recommend CT chest with contrast for further evaluation. Electronically Signed   By: Franchot Gallo M.D.   On: 09/20/2021 15:22   MR BRAIN W WO CONTRAST  Result Date: 10/15/2021 CLINICAL DATA:  Non-small cell lung cancer.  Staging. EXAM: MRI HEAD WITHOUT AND WITH CONTRAST TECHNIQUE: Multiplanar, multiecho pulse sequences of the brain and surrounding structures were obtained without and with intravenous contrast. CONTRAST:  48mL GADAVIST GADOBUTROL 1 MMOL/ML IV SOLN COMPARISON:  Head CT 12/12/2015 FINDINGS: Brain: Diffusion imaging does not show any acute or subacute infarction or other cause of restricted diffusion. No abnormality affects the brainstem or cerebellum. Cerebral hemispheres show a few punctate foci of T2 and FLAIR signal in the white matter, often seen at this age. No cortical or large vessel territory infarction. No evidence of primary or metastatic mass lesion, hemorrhage, hydrocephalus or extra-axial collection. After contrast administration, no abnormal enhancement occurs. Vascular: Major vessels at the base of the brain show flow. Skull and upper cervical spine: Negative Sinuses/Orbits: Retention cyst in the left maxillary sinus. Mastoid effusion on the  right. Other: None IMPRESSION: No evidence of metastatic disease. Few punctate foci of T2 and FLAIR signal in the hemispheric white matter, consistent with minimal small vessel change. Right mastoid effusion, extensive.  No causative feature identified. Electronically Signed   By: Nelson Chimes M.D.   On: 10/15/2021 16:57   MR LIVER W WO CONTRAST  Result Date: 10/06/2021 CLINICAL DATA:  Squamous cell carcinoma. Liver lesion identified on PET-CT scan. MRI recommended for further characterization. Hypermetabolic lung nodule additionally EXAM: MRI ABDOMEN WITHOUT AND WITH CONTRAST TECHNIQUE: Multiplanar multisequence MR imaging of the abdomen was performed both before and after the  administration of intravenous contrast. CONTRAST:  26mL GADAVIST GADOBUTROL 1 MMOL/ML IV SOLN COMPARISON:  PET-CT scan 09/28/2021, CT chest 10/06/2021 FINDINGS: Lower chest:  Lung bases are clear. Hepatobiliary: Within the RIGHT hepatic lobe, there is a peripheral enhancing lesion which corresponds to the hypermetabolic lesion on comparison FDG PET scan. Lesion measures 16 mm by 16 mm on image 49/series 16. Lesion has continuous peripheral enhancement typical of metastatic lesion. No additional lesions are present in the liver. Normal gallbladder and biliary tree. Pancreas: Normal pancreatic parenchymal intensity. No ductal dilatation or inflammation. Spleen: Normal spleen. Adrenals/urinary tract: Loss of signal intensity in the adrenal glands consistent benign bilateral adrenal adenomas. Stomach/Bowel: thickening of the gastric mucosa to 2.8 cm (image 15/4). No abnormal metabolic activity on comparison FDG PET scan. Vascular/Lymphatic: Abdominal aortic normal caliber. No retroperitoneal periportal lymphadenopathy. Musculoskeletal: No aggressive osseous lesion IMPRESSION: 1. Peripheral enhancing lesion in the RIGHT hepatic lobe which corresponds to hypermetabolic lesion on comparison FDG PET scan is consistent with solitary hepatic metastasis. 2. Bilateral benign adrenal adenomas. Electronically Signed   By: Suzy Bouchard M.D.   On: 10/06/2021 10:36   NM PET Image Initial (PI) Skull Base To Thigh  Result Date: 09/29/2021 CLINICAL DATA:  Initial treatment strategy for squamous cell carcinoma of the scalp. Right apical pulmonary nodule and posterior cervical adenopathy on CT. EXAM: NUCLEAR MEDICINE PET SKULL BASE TO THIGH TECHNIQUE: 12.3 mCi F-18 FDG was injected intravenously. Full-ring PET imaging was performed from the skull base to thigh after the radiotracer. CT data was obtained and used for attenuation correction and anatomic localization. Fasting blood glucose: 97 mg/dl COMPARISON:  CT neck 09/20/2021.  FINDINGS: Mediastinal blood pool activity: SUV max 2.3 NECK: Imaging of the head and neck is limited by motion artifact and resulting misregistration between the PET and CT images. There is hypermetabolic activity in the central occipital scalp (SUV max 5.8) which may correspond with the patient's known scalp lesion. The previously demonstrated small right occipital lymph node is mildly hypermetabolic (SUV max 2.9). No other hypermetabolic cervical lymph nodes are identified.Mucosal assessment limited due to the motion. Activity within the lymphoid tissue of Waldeyer's ring is grossly unremarkable. Incidental CT findings: Left maxillary sinus retention cysts and bilateral carotid atherosclerosis. CHEST: There are no hypermetabolic mediastinal, hilar or axillary lymph nodes. The previously demonstrated irregular right apical lesion is hypermetabolic. This lesion measures 2.4 x 1.3 cm on image 11/8 and has an SUV max of 5.2, suspicious for malignancy. No other hypermetabolic pulmonary activity or suspicious nodularity. Incidental CT findings: Mild atherosclerosis of the aorta, great vessels and coronary arteries. ABDOMEN/PELVIS: Focal hypermetabolic activity inferiorly in the right hepatic lobe, corresponding with a 1.8 x 1.5 cm lesion in segment 5 (image 146/4). This has an SUV max of 5.7. No other hypermetabolic activity identified in the liver, spleen, pancreas or adrenal glands. There is no hypermetabolic nodal activity.  Incidental CT findings: Mild low-density enlargement of both adrenal glands without hypermetabolic activity, consistent with hyperplasia or small adenomas. Seed implants are noted within the prostate gland. Mild aortic and branch vessel atherosclerosis. SKELETON: There is a focal hypermetabolic lesion within the proximal right femoral diaphysis (SUV max 7.4). This corresponds with an area of increased density within the medullary cavity on CT image 237/4. No other hypermetabolic osseous activity.  Incidental CT findings: none IMPRESSION: 1. The spiculated nodule at the right lung apex on recent neck CT is hypermetabolic and is concerning for primary bronchogenic carcinoma. Tissue sampling recommended. 2. Hypermetabolic activity within the occipital scalp and small previously demonstrated right occipital lymph node compatible with known squamous cell carcinoma. Evaluation of the head and neck limited by motion artifact. 3. Hypermetabolic lesions inferiorly in the right hepatic lobe and in the proximal right femoral diaphysis suspicious for metastatic disease, primary uncertain in this patient with a history of prostate cancer. Correlate with PSA levels. Abdominal MRI without and with contrast may be helpful for further characterization of the liver lesion. Electronically Signed   By: Richardean Sale M.D.   On: 09/29/2021 13:54   DG Chest Port 1 View  Result Date: 10/09/2021 CLINICAL DATA:  Post bronchoscopy. EXAM: PORTABLE CHEST 1 VIEW COMPARISON:  01/05/2021 and CT chest 10/06/2021. FINDINGS: Trachea is midline. Heart is enlarged. A fiducial marker is seen at the site of a known right upper lobe nodule. No definite pneumothorax status post bronchoscopic biopsy. There may be streaky atelectasis in both lung bases. No airspace consolidation. No pleural fluid. IMPRESSION: 1. Fiducial marker localizes a known right upper lobe nodule. No definite pneumothorax after bronchoscopic biopsy. 2. Bibasilar streaky atelectasis. Electronically Signed   By: Lorin Picket M.D.   On: 10/09/2021 13:09   DG C-Arm 1-60 Min-No Report  Result Date: 10/09/2021 Fluoroscopy was utilized by the requesting physician.  No radiographic interpretation.   CT Super D Chest Wo Contrast  Result Date: 10/06/2021 CLINICAL DATA:  Lung mass.  Squamous cell carcinoma. EXAM: CT CHEST WITHOUT CONTRAST TECHNIQUE: Multidetector CT imaging of the chest was performed using thin slice collimation for electromagnetic bronchoscopy planning  purposes, without intravenous contrast. COMPARISON:  PET-CT scan 09/28/2021 FINDINGS: Cardiovascular: Coronary artery calcification and aortic atherosclerotic calcification. Mediastinum/Nodes: No axillary or supraclavicular adenopathy. No mediastinal or hilar adenopathy. No pericardial fluid. Esophagus normal. Lungs/Pleura: Spiculated nodule in the RIGHT lung apex measures 2.6 x 1.5 cm (image 24/CT series 3). This nodule is hypermetabolic on comparison FDG PET scan. No additional pulmonary nodules identified. Upper Abdomen: Low-attenuation lesion in the RIGHT hepatic lobe corresponds to hypermetabolic lesion on comparison FDG PET scan. Low-attenuation of the adrenal glands consistent with adenomas or hyperplasia. Musculoskeletal: No aggressive osseous lesion. IMPRESSION: 1. Spiculated mass in the RIGHT upper lobe remains concerning for malignancy. 2. Low-density lesion in the RIGHT hepatic lobe corresponds to hypermetabolic lesion on comparison PET-CT scan and concerning for metastatic liver lesion. 3. Adrenal hyperplasia versus adenomas. Electronically Signed   By: Suzy Bouchard M.D.   On: 10/06/2021 10:22   DG C-ARM BRONCHOSCOPY  Result Date: 10/09/2021 C-ARM BRONCHOSCOPY: Fluoroscopy was utilized by the requesting physician.  No radiographic interpretation.     ASSESSMENT & PLAN: Patient is a 60 y.o. 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 Julien Nordmann  and radiation oncologist Dr. Lisbeth Renshaw.  #) Neck mass- Patient non toxic appearing, afebrile. Area outlined with skin marker so they can monitor at home. Swelling and mass feel superficial. Location consistent with right occipital lymph node.I personally viewed recent PET scan which shows hypermetabolic activity in occipital  scalp and small right occipital lymph node which is compatible with squamous cell carcinoma. Given sudden growth and skin findings I have concern for squamous cell disease progression, felt less likely to be cellulitis/abscess however will cover with doxycyline so that infection can be ruled out. Also recommend tylenol for pain as patient is on Plavix and should avoid NSAIDs. I discussed patient with radiation oncology team Dr. Lisbeth Renshaw and Shona Simpson PA-C to collaborate on plan to best serve patient. We all think it is appropriate for patient to return to general surgery for biopsy to confirm if this is in fact squamous cell carcinoma to help guide further treatment given patient has multiple pathologies at this time. I attempted to reach out to general surgery office without success. Discussed plan with patient and spouse. They are hesitant yet agreeable to follow up with surgery and plan to call the office to schedule follow up. Discussed ED precautions should symptoms worsen or new ones develop.     Visit Diagnosis: 1. Localized swelling, mass and lump, neck      No orders of the defined types were placed in this encounter.   All questions were answered. The patient knows to call the clinic with any problems, questions or concerns. No barriers to learning was detected.  I have spent a total of 30 minutes minutes of face-to-face and non-face-to-face time, preparing to see the patient, obtaining and/or reviewing separately obtained history, performing a medically appropriate examination, counseling and educating the patient, ordering tests,  documenting clinical information in the electronic health record, and care coordination.     Thank you for allowing me to participate in the care of this patient.    Barrie Folk, PA-C Department of Hematology/Oncology [facility] [address] [facility] Phone: [phone]  [phone]    10/19/2021 1:22 PM

## 2021-10-19 NOTE — Telephone Encounter (Signed)
'   Patients wife asked someone to please call her as soon as possible said the knot on his head has grown into the size of a golfball overnight. "  I spoke to wife with pt in background. Pt forgot to tell Dr. Julien Nordmann yesterday.   The "knot is on the back of his neck and is painful to touch". Wife said it is not visible but he can feel it under skin. He cannot turn neck without inducing pain. Denies interfering with breathing or swallowing.  He has been on Amoxicillin x 5 days.  Pt will be seen in [facility] by PA . Wife notified of appt.

## 2021-10-20 ENCOUNTER — Other Ambulatory Visit: Payer: Self-pay | Admitting: Physician Assistant

## 2021-10-20 ENCOUNTER — Telehealth: Payer: Self-pay

## 2021-10-20 ENCOUNTER — Encounter: Payer: Self-pay | Admitting: Internal Medicine

## 2021-10-20 ENCOUNTER — Encounter: Payer: Self-pay | Admitting: General Practice

## 2021-10-20 ENCOUNTER — Telehealth: Payer: Self-pay | Admitting: Physician Assistant

## 2021-10-20 DIAGNOSIS — C3411 Malignant neoplasm of upper lobe, right bronchus or lung: Secondary | ICD-10-CM

## 2021-10-20 DIAGNOSIS — F419 Anxiety disorder, unspecified: Secondary | ICD-10-CM

## 2021-10-20 NOTE — Telephone Encounter (Signed)
I received a message that this patient was interested in a second opinion. I am happy to place the referral. I called the patient/wife to ask where they would like me to place the referral to. Unable to reach them. I left our call back number.

## 2021-10-20 NOTE — Telephone Encounter (Signed)
Pt wife returned call regarding referral for second opinion. Pt wife gave me the information for Dr. Charlaine Dalton at [facility] cancer center. Pt wife provided number as well for cancer canter at [phone]. Chris Heilingoetter, PA made aware. Pt/wife had no further questions or concerns at this time.

## 2021-10-20 NOTE — Progress Notes (Signed)
[place] Clinical Social Work  Initial Assessment   RAJON BISIG is a 60 y.o. year old male contacted by phone, spoke with wife, husband is going to work.   Clinical Social Work was referred by radiation oncology for assessment of psychosocial needs.   SDOH (Social Determinants of Health) assessments performed: No   Distress Screen completed: Yes ONCBCN DISTRESS SCREENING 09/13/2021  Screening Type Initial Screening  Distress experienced in past week (1-10) 3  Emotional problem type Adjusting to illness  Physician notified of physical symptoms -  Referral to clinical psychology -  Referral to clinical social work -  Referral to dietition -  Referral to financial advocate -  Referral to support programs -  Referral to palliative care -   Family/Social Information:  Housing Arrangement: patient lives with wife Family members/support persons in your life? Family Transportation concerns: no  Employment: Working full time.  Wife concerned he may not be able to continue to work during treatment.  Wondering how to decide when to look into filing for disability Income source: Employment Financial concerns: Yes, due to illness and/or loss of work during treatment; wife is trying to get appropriate information to [place] so they can utilize their [employer].  She has been told she cannot receive any assistance with needed forms until their bill is paid in full.  She needs help from billing and medical records, she will contact  Type of concern: Medical bills Food access concerns: no Religious or spiritual practice: wife mentions faith as being important to her Medication Concerns: no, none noted  Services Currently in place:  none  Coping/ Adjustment to diagnosis: Patient understands treatment plan and what happens next? 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  Per wife "it is in his blood."  She and  patient are overwhelmed with the progression from scalp lesion that was excised in early October to now being diagnosed with Stage IV lung cancer.  Feel they lack concrete information on "what to expect" and how to prepare for treatment.  The process has resulted in feeling significant loss of control over life.  Normally a confident, strong man, able to handle all kinds of challenges.  This is significantly unsettling and traumatic.   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, Publishing rights manager, and Programme researcher, broadcasting/film/video and priorities: regaining some control over life, finding resources to face challenges inherent in cancer diagnosis and treatment Patient enjoys  motorcycles, twin infant grandchildren Current coping skills/ strengths: Average or above average intelligence  and Supportive family/friends     SUMMARY:  Clinical Social Work Clinical Goal(s):  Link patient and wife with additional resources to build coping skills and reduce anxiety related to cancer diagnosis and treatment  Interventions: Discussed common feeling and emotions when being diagnosed with cancer, and the importance of support during treatment Informed patient of the support team roles and support services at [facility] Provided CSW contact information and encouraged patient to call with any questions or concerns Referred patient to multiple outside resources including [facility], [employer], [employer], [facility] [place]. Wife requests referral of patient to Dr Michail Sermon, health psychologist, treatment team notified Concerns related to billing, cancer insurance policy and similar should be addressed by appropriate personnel in [facility]  including [facility] and [facility] billing SM sent to treatment team as wife/patient are requesting second  opinion and possible referral to [facility] as they are closer to [place]: Patient will contact CSW with any support or resource needs Patient/wife verbalizes understanding of plan: Yes    Beverely Pace , [place], [place] Worker Phone:  [phone]

## 2021-10-24 ENCOUNTER — Encounter: Payer: Self-pay | Admitting: Internal Medicine

## 2021-10-24 ENCOUNTER — Telehealth: Payer: Self-pay | Admitting: Medical Oncology

## 2021-10-24 ENCOUNTER — Encounter: Payer: Self-pay | Admitting: *Deleted

## 2021-10-24 ENCOUNTER — Telehealth: Payer: Self-pay

## 2021-10-24 DIAGNOSIS — C3492 Malignant neoplasm of unspecified part of left bronchus or lung: Secondary | ICD-10-CM | POA: Diagnosis not present

## 2021-10-24 NOTE — Telephone Encounter (Signed)
Left message for patient to check-in on how he's doing. I left my extension [phone] for patient to return call if need-be.

## 2021-10-24 NOTE — Progress Notes (Signed)
Oncology Nurse Navigator Documentation  Oncology Nurse Navigator Flowsheets 10/24/2021 10/13/2021 10/03/2021 08/01/2018 05/30/2018 05/05/2018 04/01/2018  Abnormal Finding Date - - 09/20/2021 - - - 01/01/2018  Confirmed Diagnosis Date - 10/09/2021 - - - - 02/07/2018  Diagnosis Status - Confirmed Diagnosis Complete Additional Work Up - - - -  Planned Course of Treatment - Chemotherapy;Targeted Therapy;Radiation - - - - -  Phase of Treatment Radiation Radiation - - - - -  Chemotherapy Actual Start Date: 10/18/2021 - - - - - -  Radiation Actual Start Date: 10/18/2021 - - - - - -  Expected Surgery Date - - - 07/17/2018 - - -  Navigator Follow Up Date: 10/26/2021 10/24/2021 10/05/2021 - - - -  Navigator Follow Up Reason: Appointment Review Appointment Review;Review Note Appointment Review - - - -  Navigator Location [facility]  Referral Date to RadOnc/MedOnc - - 10/03/2021 - - - -  Navigator Encounter Type Other: Other: Other: Treatment Treatment Telephone Telephone  Telephone - - - - - Incoming Call Outgoing Call;Patient Update  Treatment Initiated Date 10/18/2021 - - - 05/30/2018 - -  Patient Visit Type Other Other Other - - - -  Treatment Phase Treatment Pre-Tx/Tx Discussion Abnormal Scans CT SIM;Post-Tx Follow-up CT SIM - -  Barriers/Navigation Needs Coordination of Care/I received message that patient would like a second opinion with Dr. Janeth Rase.  I contacted [facility] nurse navigators with an update.  Coordination of Care Coordination of Care Education Education Education Education  Education - - - - Preparing for Upcoming Surgery/ Treatment Pain/ Symptom Management;Preparing for Upcoming Surgery/ Treatment Understanding Cancer/ Treatment Options  Interventions Coordination of Care Coordination of Care Coordination of Care Education Education Education Education  Acuity Level 2-Minimal Needs (1-2 Barriers  Identified) Level 2-Minimal Needs (1-2 Barriers Identified) Level 2-Minimal Needs (1-2 Barriers Identified) Level 1 - - Level 2  Coordination of Care - Other Other - - - -  Education Method - - - Teach-back;Verbal Teach-back;Verbal;Written Teach-back;Verbal Teach-back;Verbal  Support Groups/Services - - - - - - Friends and Family  Time Spent with Patient 30 30 30 15 15 15  30

## 2021-10-24 NOTE — Telephone Encounter (Signed)
Referral for [place] to see Charlaine Dalton  Appt Jan 9th confirmed with wife .

## 2021-10-24 NOTE — Progress Notes (Signed)
Pharmacist Chemotherapy Monitoring - Initial Assessment    Anticipated start date: 11/01/21   The following has been reviewed per standard work regarding the patient's treatment regimen: The patient's diagnosis, treatment plan and drug doses, and organ/hematologic function Lab orders and baseline tests specific to treatment regimen  The treatment plan start date, drug sequencing, and pre-medications Prior authorization status  Patient's documented medication list, including drug-drug interaction screen and prescriptions for anti-emetics and supportive care specific to the treatment regimen The drug concentrations, fluid compatibility, administration routes, and timing of the medications to be used The patient's access for treatment and lifetime cumulative dose history, if applicable  The patient's medication allergies and previous infusion related reactions, if applicable   Changes made to treatment plan:  N/A  Follow up needed:  Pending authorization for treatment    Philomena Course, [place], 10/24/2021  11:51 AM

## 2021-10-25 ENCOUNTER — Encounter: Payer: Self-pay | Admitting: Internal Medicine

## 2021-10-25 NOTE — Telephone Encounter (Deleted)
I had a lengthy conversation with pts wife regarding pts current plan of tx. She understands the pt is on course to start chemo

## 2021-10-25 NOTE — Telephone Encounter (Signed)
I had a lengthy conversation with pts wife regarding pts current plan of tx. She understands the pt is on course to start chemo 11/01/20 barring any changes. She understands his chemo education appt on 10/27/21 is to review the chemo he is scheduled for as well as possible side effects, how to manage them and when to call.   Pts wife is very anxious about what to expect going forward once tx is started as well as if pt will have immunotherapy. I have reviewed pts 10/18/21 progress note and reiterated "The patient would benefit from treatment with targeted therapy if the molecular study showed an actionable mutation but these results are not available for now and likely to be available before restarting the first cycle of his treatment."  She understands pt has appt with Dr. Julien Nordmann on 10/31/20 and to write down her questions to review with him at that time. Pts wife was appreciative of the call and expressed understanding of the information.

## 2021-10-27 ENCOUNTER — Other Ambulatory Visit: Payer: Self-pay

## 2021-10-27 ENCOUNTER — Inpatient Hospital Stay: Payer: BC Managed Care – PPO

## 2021-10-27 ENCOUNTER — Ambulatory Visit (HOSPITAL_COMMUNITY): Payer: BC Managed Care – PPO

## 2021-10-28 ENCOUNTER — Encounter: Payer: Self-pay | Admitting: Cardiovascular Disease

## 2021-10-31 ENCOUNTER — Inpatient Hospital Stay: Payer: BC Managed Care – PPO

## 2021-10-31 ENCOUNTER — Inpatient Hospital Stay: Payer: BC Managed Care – PPO | Admitting: Internal Medicine

## 2021-10-31 ENCOUNTER — Other Ambulatory Visit: Payer: Self-pay

## 2021-10-31 ENCOUNTER — Encounter: Payer: Self-pay | Admitting: Internal Medicine

## 2021-10-31 VITALS — BP 134/97 | HR 72 | Temp 97.8°F | Resp 18 | Ht 71.0 in | Wt 240.9 lb

## 2021-10-31 DIAGNOSIS — Z5112 Encounter for antineoplastic immunotherapy: Secondary | ICD-10-CM

## 2021-10-31 DIAGNOSIS — C3411 Malignant neoplasm of upper lobe, right bronchus or lung: Secondary | ICD-10-CM

## 2021-10-31 DIAGNOSIS — Z79899 Other long term (current) drug therapy: Secondary | ICD-10-CM | POA: Insufficient documentation

## 2021-10-31 DIAGNOSIS — F419 Anxiety disorder, unspecified: Secondary | ICD-10-CM | POA: Insufficient documentation

## 2021-10-31 DIAGNOSIS — C7951 Secondary malignant neoplasm of bone: Secondary | ICD-10-CM | POA: Insufficient documentation

## 2021-10-31 DIAGNOSIS — C4442 Squamous cell carcinoma of skin of scalp and neck: Secondary | ICD-10-CM | POA: Insufficient documentation

## 2021-10-31 DIAGNOSIS — Z5111 Encounter for antineoplastic chemotherapy: Secondary | ICD-10-CM

## 2021-10-31 DIAGNOSIS — Z923 Personal history of irradiation: Secondary | ICD-10-CM | POA: Insufficient documentation

## 2021-10-31 DIAGNOSIS — C787 Secondary malignant neoplasm of liver and intrahepatic bile duct: Secondary | ICD-10-CM | POA: Insufficient documentation

## 2021-10-31 DIAGNOSIS — Z8546 Personal history of malignant neoplasm of prostate: Secondary | ICD-10-CM | POA: Insufficient documentation

## 2021-10-31 DIAGNOSIS — Z515 Encounter for palliative care: Secondary | ICD-10-CM | POA: Diagnosis not present

## 2021-10-31 LAB — CBC WITH DIFFERENTIAL (CANCER CENTER ONLY)
Abs Immature Granulocytes: 0.01 10*3/uL (ref 0.00–0.07)
Basophils Absolute: 0 10*3/uL (ref 0.0–0.1)
Basophils Relative: 0 %
Eosinophils Absolute: 0 10*3/uL (ref 0.0–0.5)
Eosinophils Relative: 0 %
HCT: 48.3 % (ref 39.0–52.0)
Hemoglobin: 16.4 g/dL (ref 13.0–17.0)
Immature Granulocytes: 0 %
Lymphocytes Relative: 26 %
Lymphs Abs: 1.8 10*3/uL (ref 0.7–4.0)
MCH: 29.5 pg (ref 26.0–34.0)
MCHC: 34 g/dL (ref 30.0–36.0)
MCV: 87 fL (ref 80.0–100.0)
Monocytes Absolute: 0.7 10*3/uL (ref 0.1–1.0)
Monocytes Relative: 11 %
Neutro Abs: 4.3 10*3/uL (ref 1.7–7.7)
Neutrophils Relative %: 63 %
Platelet Count: 217 10*3/uL (ref 150–400)
RBC: 5.55 MIL/uL (ref 4.22–5.81)
RDW: 13.2 % (ref 11.5–15.5)
WBC Count: 6.8 10*3/uL (ref 4.0–10.5)
nRBC: 0 % (ref 0.0–0.2)

## 2021-10-31 LAB — TSH: TSH: 1.091 u[IU]/mL (ref 0.320–4.118)

## 2021-10-31 LAB — CMP (CANCER CENTER ONLY)
ALT: 17 U/L (ref 0–44)
AST: 15 U/L (ref 15–41)
Albumin: 4.4 g/dL (ref 3.5–5.0)
Alkaline Phosphatase: 81 U/L (ref 38–126)
Anion gap: 9 (ref 5–15)
BUN: 17 mg/dL (ref 6–20)
CO2: 22 mmol/L (ref 22–32)
Calcium: 9.4 mg/dL (ref 8.9–10.3)
Chloride: 106 mmol/L (ref 98–111)
Creatinine: 1.08 mg/dL (ref 0.61–1.24)
GFR, Estimated: >60 mL/min (ref >60)
Glucose, Bld: 113 mg/dL — ABNORMAL HIGH (ref 70–99)
Potassium: 3.8 mmol/L (ref 3.5–5.1)
Sodium: 137 mmol/L (ref 135–145)
Total Bilirubin: 0.7 mg/dL (ref 0.3–1.2)
Total Protein: 7.5 g/dL (ref 6.5–8.1)

## 2021-10-31 MED FILL — Fosaprepitant Dimeglumine For IV Infusion 150 MG (Base Eq): INTRAVENOUS | Qty: 5 | Status: AC

## 2021-10-31 MED FILL — Dexamethasone Sodium Phosphate Inj 100 MG/10ML: INTRAMUSCULAR | Qty: 1 | Status: AC

## 2021-10-31 NOTE — Progress Notes (Signed)
Met with patient at registration to introduce myself as Arboriculturist and to offer available resources.  Discussed one-time $1000 Advertising account executive and to offer available resources. Advised what is needed to apply.  Also discussed possible available copay assistance if needed.  I will be meeting with spouse 1/4 to discuss further.  They have my card for any additional financial questions or concerns.

## 2021-10-31 NOTE — Progress Notes (Signed)
[place] Telephone:([phone]) [phone]   Fax:(336) [phone]  OFFICE PROGRESS NOTE  Casilda Carls, MD [place] [address]  DIAGNOSIS: 1) stage IV (T1c, N0, M1 C) non-small cell lung cancer favoring adenocarcinoma presented with right lung apical nodule in addition to metastatic disease in the right hepatic lobe and the proximal right femoral diaphysis diagnosed and December 2022. 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  First dose expected on November 01, 2021.   INTERVAL HISTORY: Chris [name] 61 y.o. male returns to the clinic today for follow-up visit accompanied by his wife.  The patient is feeling fine today with no concerning complaints except for the back of the neck pain and a palpable lymph node in the area where he had surgical excision of the squamous cell carcinoma.  He denied having any current chest pain, shortness of breath, cough or hemoptysis.  He denied having any fever or chills.  He has no nausea, vomiting, diarrhea or constipation.  He denied having any headache or visual changes.  He has no significant weight loss or night sweats.  He is expected to start SBRT to several spots including the right upper lobe as well as the femoral lesion under the care of Dr. Lisbeth Renshaw.  The patient had molecular studies by [employer] and that showed positive KRAS G12C mutation.  He is here today for evaluation before starting the first cycle of his treatment.    MEDICAL HISTORY: Past Medical History:  Diagnosis  Date   Anxiety    associated with medical care,  worsened by difficulty hearing, does better with wife present   Complication of anesthesia    wife states very anxious may need pre sedation   Coronary artery disease    GERD (gastroesophageal reflux disease)    Hearing loss    mild per wife   Hearing loss    no hearing aids per wife   Hyperlipidemia    Hypertension    MI (myocardial infarction) ([facility]) 04/17/2006   Acute inferolateral wall MI with cardiogenic shock and complete heart block   Prostate cancer ([facility])    Tobacco abuse    Wears glasses    Wears partial dentures    Upper    ALLERGIES:  has No Known Allergies.  MEDICATIONS:  Current Outpatient Medications  Medication Sig Dispense Refill   ALPRAZolam (XANAX) 0.25 MG tablet Take 1 tablet (0.25 mg total) by mouth at bedtime. 15 tablet 0   amoxicillin-clavulanate (AUGMENTIN) 875-125 MG tablet Take 1 tablet by mouth 2 (two) times daily. 14 tablet 0   atorvastatin (LIPITOR) 20 MG tablet TAKE 1 TABLET BY MOUTH EVERY DAY 90 tablet 3   clopidogrel (PLAVIX) 75 MG tablet Take 1 tablet (75 mg total) by mouth daily. TAKE 1 TABLET (75 MG TOTAL) BY MOUTH DAILY. Okay to restart this medication on 10/10/2021. 90 tablet 3   folic acid (FOLVITE) 1 MG tablet Take 1 tablet (1 mg total) by mouth daily. 30 tablet 4   ketoconazole (NIZORAL) 2 % cream Apply 1 application topically 2 (two) times daily as needed for irritation.  metoprolol tartrate (LOPRESSOR) 25 MG tablet Take 1 tablet (25 mg total) by mouth daily. 30 tablet 11   omeprazole (PRILOSEC OTC) 20 MG tablet Take 20 mg by mouth daily.     prochlorperazine (COMPAZINE) 10 MG tablet Take 1 tablet (10 mg total) by mouth every 6 (six) hours as needed for nausea or vomiting. 30 tablet 0   tamsulosin (FLOMAX) 0.4 MG CAPS capsule Take 1 capsule (0.4 mg total) by mouth daily. 30 capsule 3   No current facility-administered medications for this visit.    SURGICAL HISTORY:  Past Surgical  History:  Procedure Laterality Date   BRONCHIAL BIOPSY  10/09/2021   Procedure: BRONCHIAL BIOPSIES;  Surgeon: Collene Gobble, MD;  Location: [facility] ENDOSCOPY;  Service: Pulmonary;;   BRONCHIAL BRUSHINGS  10/09/2021   Procedure: BRONCHIAL BRUSHINGS;  Surgeon: Collene Gobble, MD;  Location: [facility] ENDOSCOPY;  Service: Pulmonary;;   BRONCHIAL NEEDLE ASPIRATION BIOPSY  10/09/2021   Procedure: BRONCHIAL NEEDLE ASPIRATION BIOPSIES;  Surgeon: Collene Gobble, MD;  Location: [facility] ENDOSCOPY;  Service: Pulmonary;;   CARDIAC CATHETERIZATION  2007   left, RCA 100% occluded ruptured plaque with thrombus in the proximal segment   CYST EXCISION N/A 08/30/2021   Procedure: EXCISION OF POSTERIOR SCALP CYST;  Surgeon: Clovis Riley, MD;  Location: [facility] ORS;  Service: General;  Laterality: N/A;   CYSTOSCOPY N/A 07/17/2018   Procedure: Consuela Mimes;  Surgeon: Franchot Gallo, MD;  Location: [facility];  Service: Urology;  Laterality: N/A;  no seeds in bladder per Dr Diona Fanti   FIDUCIAL MARKER PLACEMENT  10/09/2021   Procedure: FIDUCIAL MARKER PLACEMENT;  Surgeon: Collene Gobble, MD;  Location: [facility] ENDOSCOPY;  Service: Pulmonary;;   PROSTATE BIOPSY     RADIOACTIVE SEED IMPLANT N/A 07/17/2018   Procedure: RADIOACTIVE SEED IMPLANT/BRACHYTHERAPY IMPLANT;  Surgeon: Franchot Gallo, MD;  Location: [facility];  Service: Urology;  Laterality: N/A;  77 seeds   SPACE OAR INSTILLATION N/A 07/17/2018   Procedure: SPACE OAR INSTILLATION;  Surgeon: Franchot Gallo, MD;  Location[facility];  Service: Urology;  Laterality: N/A;   VIDEO BRONCHOSCOPY WITH RADIAL ENDOBRONCHIAL ULTRASOUND  10/09/2021   Procedure: VIDEO BRONCHOSCOPY WITH RADIAL ENDOBRONCHIAL ULTRASOUND;  Surgeon: Collene Gobble, MD;  Location: [facility] ENDOSCOPY;  Service: Pulmonary;;   WRIST SURGERY  10/04/2012    REVIEW OF SYSTEMS:  Constitutional: positive for fatigue Eyes: negative Ears, nose, mouth, throat, and  face: negative Respiratory: negative Cardiovascular: negative Gastrointestinal: negative Genitourinary:negative Integument/breast: negative Hematologic/lymphatic: negative Musculoskeletal:positive for neck pain Neurological: negative Behavioral/Psych: negative Endocrine: negative Allergic/Immunologic: negative   PHYSICAL EXAMINATION: General appearance: alert, cooperative, fatigued, and no distress Head: Normocephalic, without obvious abnormality, atraumatic Neck: no adenopathy, no JVD, supple, symmetrical, trachea midline, and thyroid not enlarged, symmetric, no tenderness/mass/nodules Lymph nodes: Cervical, supraclavicular, and axillary nodes normal. Resp: clear to auscultation bilaterally Back: symmetric, no curvature. ROM normal. No CVA tenderness. Cardio: regular rate and rhythm, S1, S2 normal, no murmur, click, rub or gallop GI: soft, non-tender; bowel sounds normal; no masses,  no organomegaly Extremities: extremities normal, atraumatic, no cyanosis or edema Neurologic: Alert and oriented X 3, normal strength and tone. Normal symmetric reflexes. Normal coordination and gait  ECOG PERFORMANCE STATUS: 1 - Symptomatic but completely ambulatory  Blood pressure (!) 134/97, pulse 72, temperature 97.8 F (36.6 C), temperature source Tympanic, resp. rate 18, height 5' 11"  (1.803 m), weight 240 lb 14.4 oz (109.3 kg), SpO2 100 %.  LABORATORY DATA: Lab Results  Component Value  Date   WBC 6.8 10/31/2021   HGB 16.4 10/31/2021   HCT 48.3 10/31/2021   MCV 87.0 10/31/2021   PLT 217 10/31/2021      Chemistry      Component Value Date/Time   NA 142 10/11/2021 1257   K 3.9 10/11/2021 1257   CL 107 10/11/2021 1257   CO2 25 10/11/2021 1257   BUN 17 10/11/2021 1257   CREATININE 1.07 10/11/2021 1257      Component Value Date/Time   CALCIUM 8.8 (L) 10/11/2021 1257   ALKPHOS 75 10/11/2021 1257   AST 18 10/11/2021 1257   ALT 26 10/11/2021 1257   BILITOT 0.6 10/11/2021 1257        RADIOGRAPHIC STUDIES: MR BRAIN W WO CONTRAST  Result Date: 10/15/2021 CLINICAL DATA:  Non-small cell lung cancer.  Staging. EXAM: MRI HEAD WITHOUT AND WITH CONTRAST TECHNIQUE: Multiplanar, multiecho pulse sequences of the brain and surrounding structures were obtained without and with intravenous contrast. CONTRAST:  1m GADAVIST GADOBUTROL 1 MMOL/ML IV SOLN COMPARISON:  Head CT 12/12/2015 FINDINGS: Brain: Diffusion imaging does not show any acute or subacute infarction or other cause of restricted diffusion. No abnormality affects the brainstem or cerebellum. Cerebral hemispheres show a few punctate foci of T2 and FLAIR signal in the white matter, often seen at this age. No cortical or large vessel territory infarction. No evidence of primary or metastatic mass lesion, hemorrhage, hydrocephalus or extra-axial collection. After contrast administration, no abnormal enhancement occurs. Vascular: Major vessels at the base of the brain show flow. Skull and upper cervical spine: Negative Sinuses/Orbits: Retention cyst in the left maxillary sinus. Mastoid effusion on the right. Other: None IMPRESSION: No evidence of metastatic disease. Few punctate foci of T2 and FLAIR signal in the hemispheric white matter, consistent with minimal small vessel change. Right mastoid effusion, extensive.  No causative feature identified. Electronically Signed   By: MNelson ChimesM.D.   On: 10/15/2021 16:57   MR LIVER W WO CONTRAST  Result Date: 10/06/2021 CLINICAL DATA:  Squamous cell carcinoma. Liver lesion identified on PET-CT scan. MRI recommended for further characterization. Hypermetabolic lung nodule additionally EXAM: MRI ABDOMEN WITHOUT AND WITH CONTRAST TECHNIQUE: Multiplanar multisequence MR imaging of the abdomen was performed both before and after the administration of intravenous contrast. CONTRAST:  14mGADAVIST GADOBUTROL 1 MMOL/ML IV SOLN COMPARISON:  PET-CT scan 09/28/2021, CT chest 10/06/2021 FINDINGS:  Lower chest:  Lung bases are clear. Hepatobiliary: Within the RIGHT hepatic lobe, there is a peripheral enhancing lesion which corresponds to the hypermetabolic lesion on comparison FDG PET scan. Lesion measures 16 mm by 16 mm on image 49/series 16. Lesion has continuous peripheral enhancement typical of metastatic lesion. No additional lesions are present in the liver. Normal gallbladder and biliary tree. Pancreas: Normal pancreatic parenchymal intensity. No ductal dilatation or inflammation. Spleen: Normal spleen. Adrenals/urinary tract: Loss of signal intensity in the adrenal glands consistent benign bilateral adrenal adenomas. Stomach/Bowel: thickening of the gastric mucosa to 2.8 cm (image 15/4). No abnormal metabolic activity on comparison FDG PET scan. Vascular/Lymphatic: Abdominal aortic normal caliber. No retroperitoneal periportal lymphadenopathy. Musculoskeletal: No aggressive osseous lesion IMPRESSION: 1. Peripheral enhancing lesion in the RIGHT hepatic lobe which corresponds to hypermetabolic lesion on comparison FDG PET scan is consistent with solitary hepatic metastasis. 2. Bilateral benign adrenal adenomas. Electronically Signed   By: StSuzy Bouchard.D.   On: 10/06/2021 10:36   DG Chest Port 1 View  Result Date: 10/09/2021 CLINICAL DATA:  Post bronchoscopy. EXAM: PORTABLE CHEST  1 VIEW COMPARISON:  01/05/2021 and CT chest 10/06/2021. FINDINGS: Trachea is midline. Heart is enlarged. A fiducial marker is seen at the site of a known right upper lobe nodule. No definite pneumothorax status post bronchoscopic biopsy. There may be streaky atelectasis in both lung bases. No airspace consolidation. No pleural fluid. IMPRESSION: 1. Fiducial marker localizes a known right upper lobe nodule. No definite pneumothorax after bronchoscopic biopsy. 2. Bibasilar streaky atelectasis. Electronically Signed   By: Lorin Picket M.D.   On: 10/09/2021 13:09   DG C-Arm 1-60 Min-No Report  Result Date:  10/09/2021 Fluoroscopy was utilized by the requesting physician.  No radiographic interpretation.   CT Super D Chest Wo Contrast  Result Date: 10/06/2021 CLINICAL DATA:  Lung mass.  Squamous cell carcinoma. EXAM: CT CHEST WITHOUT CONTRAST TECHNIQUE: Multidetector CT imaging of the chest was performed using thin slice collimation for electromagnetic bronchoscopy planning purposes, without intravenous contrast. COMPARISON:  PET-CT scan 09/28/2021 FINDINGS: Cardiovascular: Coronary artery calcification and aortic atherosclerotic calcification. Mediastinum/Nodes: No axillary or supraclavicular adenopathy. No mediastinal or hilar adenopathy. No pericardial fluid. Esophagus normal. Lungs/Pleura: Spiculated nodule in the RIGHT lung apex measures 2.6 x 1.5 cm (image 24/CT series 3). This nodule is hypermetabolic on comparison FDG PET scan. No additional pulmonary nodules identified. Upper Abdomen: Low-attenuation lesion in the RIGHT hepatic lobe corresponds to hypermetabolic lesion on comparison FDG PET scan. Low-attenuation of the adrenal glands consistent with adenomas or hyperplasia. Musculoskeletal: No aggressive osseous lesion. IMPRESSION: 1. Spiculated mass in the RIGHT upper lobe remains concerning for malignancy. 2. Low-density lesion in the RIGHT hepatic lobe corresponds to hypermetabolic lesion on comparison PET-CT scan and concerning for metastatic liver lesion. 3. Adrenal hyperplasia versus adenomas. Electronically Signed   By: Suzy Bouchard M.D.   On: 10/06/2021 10:22   DG C-ARM BRONCHOSCOPY  Result Date: 10/09/2021 C-ARM BRONCHOSCOPY: Fluoroscopy was utilized by the requesting physician.  No radiographic interpretation.    ASSESSMENT AND PLAN: This is a very pleasant 61 years old white male diagnosed with a stage IV (T1c, N0, M1 C) non-small cell lung cancer favoring adenocarcinoma presented with right upper lobe lung nodule in addition to solitary liver metastasis and solitary bone metastasis  in the proximal right femoral diaphysis diagnosed in December 2022.  There was insufficient material for molecular studies. The patient also has a history of squamous cell carcinoma of the posterior scalp status post excision. Molecular studies by [employer] showed positive KRAS G12C mutation which could be used for treatment in the second line setting.  I discussed the molecular study with the patient and his wife. I recommended for the patient to proceed with his systemic chemotherapy with carboplatin for AUC of 5, Alimta 500 Mg/M2 and Libtayo (Cempilimab) 350 Mg/M2 according to the recently approved indication for Libtayo (Cempilimab) from the clinical trial of [facility] as previously planned.  He is expected to start the first dose of his treatment tomorrow November 01, 2021. He is also seen by Dr. Lisbeth Renshaw and expected to undergo SBRT to the lung lesion as well as palliative radiotherapy to the right femoral lesion. For the neck pain, we will continue to monitor the subcutaneous lesion in the occipital area closely and consider referring the patient to his surgeon for evaluation if it enlarges. The patient will come back for follow-up visit in 1 week for evaluation and management of any adverse effect of his treatment. He was advised to call immediately if he has any other concerning symptoms in the interval.  The patient voices understanding of current disease status and treatment options and is in agreement with the current care plan.  All questions were answered. The patient knows to call the clinic with any problems, questions or concerns. We can certainly see the patient much sooner if necessary.  Disclaimer: This note was dictated with voice recognition software. Similar sounding words can inadvertently be transcribed and may not be corrected upon review.

## 2021-11-01 ENCOUNTER — Inpatient Hospital Stay: Payer: BC Managed Care – PPO

## 2021-11-01 ENCOUNTER — Telehealth: Payer: Self-pay | Admitting: Physician Assistant

## 2021-11-01 ENCOUNTER — Encounter: Payer: Self-pay | Admitting: Physician Assistant

## 2021-11-01 VITALS — BP 141/87 | HR 72 | Temp 98.4°F | Resp 18

## 2021-11-01 DIAGNOSIS — F419 Anxiety disorder, unspecified: Secondary | ICD-10-CM | POA: Diagnosis not present

## 2021-11-01 DIAGNOSIS — Z5111 Encounter for antineoplastic chemotherapy: Secondary | ICD-10-CM | POA: Diagnosis not present

## 2021-11-01 DIAGNOSIS — Z515 Encounter for palliative care: Secondary | ICD-10-CM | POA: Diagnosis not present

## 2021-11-01 DIAGNOSIS — C3411 Malignant neoplasm of upper lobe, right bronchus or lung: Secondary | ICD-10-CM

## 2021-11-01 DIAGNOSIS — Z5112 Encounter for antineoplastic immunotherapy: Secondary | ICD-10-CM | POA: Diagnosis not present

## 2021-11-01 MED ORDER — SODIUM CHLORIDE 0.9 % IV SOLN
350.0000 mg | Freq: Once | INTRAVENOUS | Status: AC
  Administered 2021-11-01: 350 mg via INTRAVENOUS
  Filled 2021-11-01: qty 7

## 2021-11-01 MED ORDER — SODIUM CHLORIDE 0.9 % IV SOLN: Freq: Once | INTRAVENOUS | Status: AC

## 2021-11-01 MED ORDER — SODIUM CHLORIDE 0.9 % IV SOLN
10.0000 mg | Freq: Once | INTRAVENOUS | Status: AC
  Administered 2021-11-01: 10 mg via INTRAVENOUS
  Filled 2021-11-01: qty 10

## 2021-11-01 MED ORDER — SODIUM CHLORIDE 0.9 % IV SOLN
500.0000 mg/m2 | Freq: Once | INTRAVENOUS | Status: AC
  Administered 2021-11-01: 1200 mg via INTRAVENOUS
  Filled 2021-11-01: qty 40

## 2021-11-01 MED ORDER — SODIUM CHLORIDE 0.9 % IV SOLN
696.0000 mg | Freq: Once | INTRAVENOUS | Status: AC
  Administered 2021-11-01: 700 mg via INTRAVENOUS
  Filled 2021-11-01: qty 70

## 2021-11-01 MED ORDER — PALONOSETRON HCL INJECTION 0.25 MG/5ML
0.2500 mg | Freq: Once | INTRAVENOUS | Status: AC
  Administered 2021-11-01: 0.25 mg via INTRAVENOUS
  Filled 2021-11-01: qty 5

## 2021-11-01 MED ORDER — SODIUM CHLORIDE 0.9 % IV SOLN
150.0000 mg | Freq: Once | INTRAVENOUS | Status: AC
  Administered 2021-11-01: 150 mg via INTRAVENOUS
  Filled 2021-11-01: qty 150

## 2021-11-01 NOTE — Progress Notes (Signed)
Met with patient's spouse regarding [employer] grant documents.  Patient approved for one-time $1000 [employer] grant to assist with personal expenses while going through treatment. Discussed in detail expenses and how they are covered. She has a copy of the approval letter and expense sheet along with the Outpatient pharmacy information.Gift card given today from grant.  Discussed copay program for Libtayo as well and information regarding it. Once I am able to access the portal, I will be applying on his behalf. This is being worked on by [employer] support.  All paperwork and my card given in green folder for any additional financial questions or concerns.

## 2021-11-01 NOTE — Progress Notes (Signed)
Was able to successfully enroll patient in Libtayo copay program via portal.   Claim information will be given to [facility] for claim/billing submissions.

## 2021-11-01 NOTE — Telephone Encounter (Unsigned)
Called pa

## 2021-11-01 NOTE — Patient Instructions (Signed)
[facility]  Discharge Instructions: Thank you for choosing [facility] to provide your oncology and hematology care.   If you have a lab appointment with the [facility], please go directly to the [facility] and check in at the registration area.   Wear comfortable clothing and clothing appropriate for easy access to any Portacath or PICC line.   We strive to give you quality time with your provider. You may need to reschedule your appointment if you arrive late (15 or more minutes).  Arriving late affects you and other patients whose appointments are after yours.  Also, if you miss three or more appointments without notifying the office, you may be dismissed from the clinic at the providers discretion.      For prescription refill requests, have your pharmacy contact our office and allow 72 hours for refills to be completed.    Today you received the following chemotherapy and/or immunotherapy agents Alimta, Libtayo and Carboplatin      To help prevent nausea and vomiting after your treatment, we encourage you to take your nausea medication as directed.  BELOW ARE SYMPTOMS THAT SHOULD BE REPORTED IMMEDIATELY: *FEVER GREATER THAN 100.4 F (38 C) OR HIGHER *CHILLS OR SWEATING *NAUSEA AND VOMITING THAT IS NOT CONTROLLED WITH YOUR NAUSEA MEDICATION *UNUSUAL SHORTNESS OF BREATH *UNUSUAL BRUISING OR BLEEDING *URINARY PROBLEMS (pain or burning when urinating, or frequent urination) *BOWEL PROBLEMS (unusual diarrhea, constipation, pain near the anus) TENDERNESS IN MOUTH AND THROAT WITH OR WITHOUT PRESENCE OF ULCERS (sore throat, sores in mouth, or a toothache) UNUSUAL RASH, SWELLING OR PAIN  UNUSUAL VAGINAL DISCHARGE OR ITCHING   Items with * indicate a potential emergency and should be followed up as soon as possible or go to the Emergency Department if any problems should occur.  Please show the CHEMOTHERAPY ALERT CARD or IMMUNOTHERAPY ALERT  CARD at check-in to the Emergency Department and triage nurse.  Should you have questions after your visit or need to cancel or reschedule your appointment, please contact [facility]  Dept: [phone]  and follow the prompts.  Office hours are 8:00 a.m. to 4:30 p.m. Monday - Friday. Please note that voicemails left after 4:00 p.m. may not be returned until the following business day.  We are closed weekends and major holidays. You have access to a nurse at all times for urgent questions. Please call the main number to the clinic Dept: [phone] and follow the prompts.   For any non-urgent questions, you may also contact your provider using MyChart. We now offer e-Visits for anyone 22 and older to request care online for non-urgent symptoms. For details visit [email or web address].   Also download the MyChart app! Go to the app store, search "MyChart", open the app, select [facility], and log in with your MyChart username and password.  Due to Covid, a mask is required upon entering the hospital/clinic. If you do not have a mask, one will be given to you upon arrival. For doctor visits, patients may have 1 support person aged 86 or older with them. For treatment visits, patients cannot have anyone with them due to current Covid guidelines and our immunocompromised population.

## 2021-11-03 ENCOUNTER — Encounter: Payer: Self-pay | Admitting: Internal Medicine

## 2021-11-03 ENCOUNTER — Encounter: Payer: Self-pay | Admitting: *Deleted

## 2021-11-05 NOTE — Progress Notes (Deleted)
[facility] OFFICE PROGRESS NOTE  No primary care provider on file. No primary provider on file.  DIAGNOSIS:  1) stage IV (T1c, N0, M1 C) non-small cell lung cancer favoring adenocarcinoma presented with right lung apical nodule in addition to metastatic disease in the right hepatic lobe and the proximal right femoral diaphysis diagnosed and December 2022. 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  First dose expected on November 01, 2021. Status post 1 cycle.  2) SBRT to the femoral lesion under the care of Dr. Lisbeth Renshaw. Last treatment expected on 11/17/21.   INTERVAL HISTORY: Chris [name] 61 y.o. male returns to the clinic today for follow-up visit.  The patient is accompanied by his wife.  The patient was unfortunately recently diagnosed with stage IV adenocarcinoma.  He underwent his first cycle of treatment last week and tolerated fairly well except for some fatigue following treatment.  He is planning to meet with Dr. Lisbeth Renshaw tomorrow to undergo SBRT to the femoral lesion.  His last treatment for this is expected on 11/17/2021.  The patient denies any recent fever, chills, or night sweats.  Appetite?  He denies any nausea, vomiting, diarrhea, or constipation.  Denies any chest pain, shortness of breath, cough, or hemoptysis.  Denies any headache or visual changes.  Denies any rashes or skin changes.  The patient is here today for evaluation and repeat blood work in 1 week follow-up visit to manage any adverse side  effects of treatment.     MEDICAL HISTORY: Past Medical History:  Diagnosis Date   Anxiety    associated with medical care,  worsened by difficulty hearing, does better with wife present   Complication of anesthesia    wife states very anxious may need pre sedation   Coronary artery disease    GERD (gastroesophageal reflux disease)    Hearing loss    mild per wife   Hearing loss    no hearing aids per wife   Hyperlipidemia    Hypertension    MI (myocardial infarction) ([place]) 04/17/2006   Acute inferolateral wall MI with cardiogenic shock and complete heart block   Primary adenocarcinoma of upper lobe of right lung (HCC)    Prostate cancer ([place])    Tobacco abuse    Wears glasses    Wears partial dentures    Upper    ALLERGIES:  has No Known Allergies.  MEDICATIONS:  Current Outpatient Medications  Medication Sig Dispense Refill   ALPRAZolam (XANAX) 0.25 MG tablet Take 1 tablet (0.25 mg total) by mouth at bedtime. 15 tablet 0   atorvastatin (LIPITOR) 20 MG tablet TAKE 1 TABLET BY MOUTH EVERY DAY 90 tablet 3   clopidogrel (PLAVIX) 75 MG tablet Take 1 tablet (75 mg total) by mouth daily. TAKE 1 TABLET (75 MG TOTAL) BY MOUTH DAILY. Okay to restart this medication on 10/10/2021. 90 tablet 3   folic acid (FOLVITE) 1 MG tablet Take 1 tablet (1 mg total) by mouth  daily. 30 tablet 4   ketoconazole (NIZORAL) 2 % cream Apply 1 application topically 2 (two) times daily as needed for irritation.     metoprolol tartrate (LOPRESSOR) 25 MG tablet Take 1 tablet (25 mg total) by mouth daily. 30 tablet 11   omeprazole (PRILOSEC OTC) 20 MG tablet Take 20 mg by mouth daily.     prochlorperazine (COMPAZINE) 10 MG tablet Take 1 tablet (10 mg total) by mouth every 6 (six) hours as needed for nausea or vomiting. (Patient not taking: Reported on 10/31/2021) 30 tablet 0   tamsulosin (FLOMAX) 0.4 MG CAPS capsule Take 1 capsule (0.4 mg total) by mouth daily. 30 capsule 3   No current  facility-administered medications for this visit.    SURGICAL HISTORY:  Past Surgical History:  Procedure Laterality Date   BRONCHIAL BIOPSY  10/09/2021   Procedure: BRONCHIAL BIOPSIES;  Surgeon: Collene Gobble, MD;  Location: [facility] ENDOSCOPY;  Service: Pulmonary;;   BRONCHIAL BRUSHINGS  10/09/2021   Procedure: BRONCHIAL BRUSHINGS;  Surgeon: Collene Gobble, MD;  Location: [facility] ENDOSCOPY;  Service: Pulmonary;;   BRONCHIAL NEEDLE ASPIRATION BIOPSY  10/09/2021   Procedure: BRONCHIAL NEEDLE ASPIRATION BIOPSIES;  Surgeon: Collene Gobble, MD;  Location: [facility] ENDOSCOPY;  Service: Pulmonary;;   CARDIAC CATHETERIZATION  2007   left, RCA 100% occluded ruptured plaque with thrombus in the proximal segment   CYST EXCISION N/A 08/30/2021   Procedure: EXCISION OF POSTERIOR SCALP CYST;  Surgeon: Clovis Riley, MD;  Location: [facility] ORS;  Service: General;  Laterality: N/A;   CYSTOSCOPY N/A 07/17/2018   Procedure: Consuela Mimes;  Surgeon: Franchot Gallo, MD;  Location: [facility];  Service: Urology;  Laterality: N/A;  no seeds in bladder per Dr Diona Fanti   FIDUCIAL MARKER PLACEMENT  10/09/2021   Procedure: FIDUCIAL MARKER PLACEMENT;  Surgeon: Collene Gobble, MD;  Location: [facility] ENDOSCOPY;  Service: Pulmonary;;   PROSTATE BIOPSY     RADIOACTIVE SEED IMPLANT N/A 07/17/2018   Procedure: RADIOACTIVE SEED IMPLANT/BRACHYTHERAPY IMPLANT;  Surgeon: Franchot Gallo, MD;  Location: [facility];  Service: Urology;  Laterality: N/A;  77 seeds   SPACE OAR INSTILLATION N/A 07/17/2018   Procedure: SPACE OAR INSTILLATION;  Surgeon: Franchot Gallo, MD;  Location: [facility];  Service: Urology;  Laterality: N/A;   VIDEO BRONCHOSCOPY WITH RADIAL ENDOBRONCHIAL ULTRASOUND  10/09/2021   Procedure: VIDEO BRONCHOSCOPY WITH RADIAL ENDOBRONCHIAL ULTRASOUND;  Surgeon: Collene Gobble, MD;  Location: [facility] ENDOSCOPY;  Service: Pulmonary;;   WRIST SURGERY  10/04/2012    REVIEW OF SYSTEMS:    Review of Systems  Constitutional: Negative for appetite change, chills, fatigue, fever and unexpected weight change.  HENT:   Negative for mouth sores, nosebleeds, sore throat and trouble swallowing.   Eyes: Negative for eye problems and icterus.  Respiratory: Negative for cough, hemoptysis, shortness of breath and wheezing.   Cardiovascular: Negative for chest pain and leg swelling.  Gastrointestinal: Negative for abdominal pain, constipation, diarrhea, nausea and vomiting.  Genitourinary: Negative for bladder incontinence, difficulty urinating, dysuria, frequency and hematuria.   Musculoskeletal: Negative for back pain, gait problem, neck pain and neck stiffness.  Skin: Negative for itching and rash.  Neurological: Negative for dizziness, extremity weakness, gait problem, headaches, light-headedness and seizures.  Hematological: Negative for adenopathy. Does not bruise/bleed easily.  Psychiatric/Behavioral: Negative for confusion, depression and sleep disturbance. The patient is not nervous/anxious.     PHYSICAL EXAMINATION:  There were no vitals taken for this visit.  ECOG PERFORMANCE STATUS: {  CHL ONC ECOG [id number]  Physical Exam  Constitutional: Oriented to person, place, and time and well-developed, well-nourished, and in no distress. No distress.  HENT:  Head: Normocephalic and atraumatic.  Mouth/Throat: Oropharynx is clear and moist. No oropharyngeal exudate.  Eyes: Conjunctivae are normal. Right eye exhibits no discharge. Left eye exhibits no discharge. No scleral icterus.  Neck: Normal range of motion. Neck supple.  Cardiovascular: Normal rate, regular rhythm, normal heart sounds and intact distal pulses.   Pulmonary/Chest: Effort normal and breath sounds normal. No respiratory distress. No wheezes. No rales.  Abdominal: Soft. Bowel sounds are normal. Exhibits no distension and no mass. There is no tenderness.  Musculoskeletal: Normal range of motion. Exhibits no  edema.  Lymphadenopathy:    No cervical adenopathy.  Neurological: Alert and oriented to person, place, and time. Exhibits normal muscle tone. Gait normal. Coordination normal.  Skin: Skin is warm and dry. No rash noted. Not diaphoretic. No erythema. No pallor.  Psychiatric: Mood, memory and judgment normal.  Vitals reviewed.  LABORATORY DATA: Lab Results  Component Value Date   WBC 6.8 10/31/2021   HGB 16.4 10/31/2021   HCT 48.3 10/31/2021   MCV 87.0 10/31/2021   PLT 217 10/31/2021      Chemistry      Component Value Date/Time   NA 137 10/31/2021 1107   K 3.8 10/31/2021 1107   CL 106 10/31/2021 1107   CO2 22 10/31/2021 1107   BUN 17 10/31/2021 1107   CREATININE 1.08 10/31/2021 1107      Component Value Date/Time   CALCIUM 9.4 10/31/2021 1107   ALKPHOS 81 10/31/2021 1107   AST 15 10/31/2021 1107   ALT 17 10/31/2021 1107   BILITOT 0.7 10/31/2021 1107       RADIOGRAPHIC STUDIES:  MR BRAIN W WO CONTRAST  Result Date: 10/15/2021 CLINICAL DATA:  Non-small cell lung cancer.  Staging. EXAM: MRI HEAD WITHOUT AND WITH CONTRAST TECHNIQUE: Multiplanar, multiecho pulse sequences of the brain and surrounding structures were obtained without and with intravenous contrast. CONTRAST:  36m GADAVIST GADOBUTROL 1 MMOL/ML IV SOLN COMPARISON:  Head CT 12/12/2015 FINDINGS: Brain: Diffusion imaging does not show any acute or subacute infarction or other cause of restricted diffusion. No abnormality affects the brainstem or cerebellum. Cerebral hemispheres show a few punctate foci of T2 and FLAIR signal in the white matter, often seen at this age. No cortical or large vessel territory infarction. No evidence of primary or metastatic mass lesion, hemorrhage, hydrocephalus or extra-axial collection. After contrast administration, no abnormal enhancement occurs. Vascular: Major vessels at the base of the brain show flow. Skull and upper cervical spine: Negative Sinuses/Orbits: Retention cyst in the  left maxillary sinus. Mastoid effusion on the right. Other: None IMPRESSION: No evidence of metastatic disease. Few punctate foci of T2 and FLAIR signal in the hemispheric white matter, consistent with minimal small vessel change. Right mastoid effusion, extensive.  No causative feature identified. Electronically Signed   By: MNelson ChimesM.D.   On: 10/15/2021 16:57   DG Chest Port 1 View  Result Date: 10/09/2021 CLINICAL DATA:  Post bronchoscopy. EXAM: PORTABLE CHEST 1 VIEW COMPARISON:  01/05/2021 and CT chest 10/06/2021. FINDINGS: Trachea is midline. Heart is enlarged. A fiducial marker is seen at the site of a known right upper lobe nodule. No definite pneumothorax status post bronchoscopic biopsy. There may be streaky atelectasis in both lung bases. No airspace consolidation. No pleural fluid. IMPRESSION: 1. Fiducial marker localizes a known right upper lobe nodule.  No definite pneumothorax after bronchoscopic biopsy. 2. Bibasilar streaky atelectasis. Electronically Signed   By: Lorin Picket M.D.   On: 10/09/2021 13:09   DG C-Arm 1-60 Min-No Report  Result Date: 10/09/2021 Fluoroscopy was utilized by the requesting physician.  No radiographic interpretation.   DG C-ARM BRONCHOSCOPY  Result Date: 10/09/2021 C-ARM BRONCHOSCOPY: Fluoroscopy was utilized by the requesting physician.  No radiographic interpretation.     ASSESSMENT/PLAN:  This is a very pleasant 61 year old Caucasian male diagnosed with stage IV (T1c, N0, M1 C) non-small cell lung cancer, favoring adenocarcinoma.  He presented with a right upper lobe lung nodule in addition to a solitary liver metastasis and solitary brain metastasis to the proximal right femur diaphysis.  He was diagnosed in December 2022.  There was insufficient material for molecular studies so he had guardant 360 molecular testing performed which showed positive K-ras G 12 C mutation which can be used for treatment in the second line setting in the  future.  The patient also has a history of squamous cell carcinoma in the posterior scalp.  He is status post excision.  The patient is currently undergoing palliative systemic chemotherapy with carboplatin for an AUC 5, Alimta 500 mg per metered squared, and Libtayo IV every 3 weeks.  He status post 1 cycle and tolerated fairly well except for fatigue.  The patient was seen with Dr. Julien Nordmann today.  Labs were reviewed.  Recommend he continue on the same treatment at the same dose.  We will see him back for follow-up visit in 2 weeks for evaluation before starting cycle #2.  The patient will meet with Dr. Glendora Score to discuss SBRT to the osseous lesion.  His last day radiation is scheduled for 11/17/2021.  Refer to Dr. Hubbard Hartshorn from psychiatry  The patient was advised to call immediately if she has any concerning symptoms in the interval. The patient voices understanding of current disease status and treatment options and is in agreement with the current care plan. All questions were answered. The patient knows to call the clinic with any problems, questions or concerns. We can certainly see the patient much sooner if necessary      No orders of the defined types were placed in this encounter.    I spent {CHL ONC TIME VISIT - CBIPJ:[id number]} counseling the patient face to face. The total time spent in the appointment was {CHL ONC TIME VISIT - EFUWT:[id number]}.  Brizeida Mcmurry L Avaree Gilberti, PA-C 11/05/21

## 2021-11-06 ENCOUNTER — Other Ambulatory Visit: Payer: Self-pay

## 2021-11-06 ENCOUNTER — Inpatient Hospital Stay: Payer: BC Managed Care – PPO

## 2021-11-06 ENCOUNTER — Encounter: Payer: Self-pay | Admitting: Internal Medicine

## 2021-11-06 ENCOUNTER — Inpatient Hospital Stay: Payer: BC Managed Care – PPO | Admitting: Internal Medicine

## 2021-11-06 DIAGNOSIS — C3411 Malignant neoplasm of upper lobe, right bronchus or lung: Secondary | ICD-10-CM

## 2021-11-06 DIAGNOSIS — Z515 Encounter for palliative care: Secondary | ICD-10-CM | POA: Diagnosis not present

## 2021-11-06 DIAGNOSIS — Z5111 Encounter for antineoplastic chemotherapy: Secondary | ICD-10-CM | POA: Diagnosis not present

## 2021-11-06 DIAGNOSIS — Z5112 Encounter for antineoplastic immunotherapy: Secondary | ICD-10-CM | POA: Diagnosis not present

## 2021-11-06 DIAGNOSIS — F419 Anxiety disorder, unspecified: Secondary | ICD-10-CM | POA: Diagnosis not present

## 2021-11-06 LAB — COMPREHENSIVE METABOLIC PANEL
ALT: 25 U/L (ref 0–44)
AST: 20 U/L (ref 15–41)
Albumin: 4.1 g/dL (ref 3.5–5.0)
Alkaline Phosphatase: 76 U/L (ref 38–126)
Anion gap: 8 (ref 5–15)
BUN: 17 mg/dL (ref 6–20)
CO2: 25 mmol/L (ref 22–32)
Calcium: 8.7 mg/dL — ABNORMAL LOW (ref 8.9–10.3)
Chloride: 103 mmol/L (ref 98–111)
Creatinine, Ser: 0.98 mg/dL (ref 0.61–1.24)
GFR, Estimated: >60 mL/min (ref >60)
Glucose, Bld: 126 mg/dL — ABNORMAL HIGH (ref 70–99)
Potassium: 3.9 mmol/L (ref 3.5–5.1)
Sodium: 136 mmol/L (ref 135–145)
Total Bilirubin: 0.7 mg/dL (ref 0.3–1.2)
Total Protein: 7.3 g/dL (ref 6.5–8.1)

## 2021-11-06 LAB — CBC WITH DIFFERENTIAL/PLATELET
Abs Immature Granulocytes: 0.01 10*3/uL (ref 0.00–0.07)
Basophils Absolute: 0 10*3/uL (ref 0.0–0.1)
Basophils Relative: 1 %
Eosinophils Absolute: 0.1 10*3/uL (ref 0.0–0.5)
Eosinophils Relative: 1 %
HCT: 47 % (ref 39.0–52.0)
Hemoglobin: 16.2 g/dL (ref 13.0–17.0)
Immature Granulocytes: 0 %
Lymphocytes Relative: 28 %
Lymphs Abs: 1.7 10*3/uL (ref 0.7–4.0)
MCH: 30.2 pg (ref 26.0–34.0)
MCHC: 34.5 g/dL (ref 30.0–36.0)
MCV: 87.7 fL (ref 80.0–100.0)
Monocytes Absolute: 0.3 10*3/uL (ref 0.1–1.0)
Monocytes Relative: 4 %
Neutro Abs: 4 10*3/uL (ref 1.7–7.7)
Neutrophils Relative %: 66 %
Platelets: 210 10*3/uL (ref 150–400)
RBC: 5.36 MIL/uL (ref 4.22–5.81)
RDW: 13.1 % (ref 11.5–15.5)
WBC: 6.1 10*3/uL (ref 4.0–10.5)
nRBC: 0 % (ref 0.0–0.2)

## 2021-11-06 NOTE — Progress Notes (Signed)
[facility] CONSULT NOTE  Patient Care Team: Lorretta Harp, MD as PCP - Cardiology (Cardiology) Valrie Hart, RN as Oncology Nurse Navigator (Oncology) Curt Bears, MD as Consulting Physician (Oncology) Casilda Carls, MD as Referring Physician (Internal Medicine) Cammie Sickle, MD as Consulting Physician (Internal Medicine) Cammie Sickle, MD as Consulting Physician (Internal Medicine)  CHIEF COMPLAINTS/PURPOSE OF CONSULTATION: lung cancer  #  Oncology History Overview Note  DIAGNOSIS: 1) stage IV (T1c, N0, M1 C) non-small cell lung cancer favoring adenocarcinoma presented with right lung apical nodule in addition to metastatic disease in the right hepatic lobe and the proximal right femoral diaphysis diagnosed and December 2022. 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 LUNG, RUL, FINE NEEDLE ASPIRATION:  - Malignant cells consistent with non-small cell carcinoma, see comment   B. LUNG, RUL, BRUSHING:  - Malignant cells consistent with non-small cell carcinoma, see comment       COMMENT:   A and B.  Dr. Saralyn Pilar reviewed the case and concurs with the diagnosis.  Only rare malignant cells are present on the cellblock.  Immunohistochemical stains were attempted and show that the tumor cells  have patchy staining for TTF-1 whereas p63, p40 and CK5/6 are negative.  The findings are nondiagnostic but suggestive of a lung adenocarcinoma.  Dr. Lamonte Sakai was notified on 10/13/2021.   SEP-OCT 2022-  [Dermatology]-   Poorly differentiated squamous cell carcinoma; -  Carcinoma extends to the edges of the excision specimen   IMPRESSION: 1. The spiculated nodule at the right lung apex on recent neck CT is hypermetabolic and is concerning for primary bronchogenic  carcinoma. Tissue sampling recommended. 2. Hypermetabolic activity within the occipital scalp and small previously demonstrated right occipital lymph node compatible with known squamous cell carcinoma. Evaluation of the head and neck limited by motion artifact. 3. Hypermetabolic lesions inferiorly in the right hepatic lobe and in the proximal right femoral diaphysis suspicious for metastatic disease, primary uncertain in this patient with a history of prostate cancer. Correlate with PSA levels. Abdominal MRI without and with contrast may be helpful for further characterization of the liver lesion.  # 2007MI-CAD [s/p stent-Dr.Berry; EF 2021- 58%]     Primary adenocarcinoma of upper lobe of right lung ([place])  10/18/2021 Initial Diagnosis   Primary adenocarcinoma of upper lobe of right lung ([place])   10/18/2021 Cancer Staging   Staging form: Lung, AJCC 8th Edition - Clinical: Stage IVB (cT1c, cN0, cM1c) - Signed by Curt Bears, MD on 10/18/2021    11/01/2021 -  Chemotherapy   Patient is on Treatment Plan : LUNG NSCLC Pemetrexed + Carboplatin q21d x 4 Cycles        HISTORY OF PRESENTING ILLNESS:  Chris [name] 61 y.o.  male history of smoking-recent diagnosis of lung cancer stage IV is here for second opinion.  Patient was diagnosed with stage IV lung cancer-non-small cell /favor adenocarcinoma approximately a month ago evaluated by Dr. Julien Nordmann in [place].  Patient received chemo-immunotherapy on Jan 4th, 2023.  Patient felt extremely poorly 2 to 3 days postchemotherapy.  No nausea vomiting.  However feels bloated.  Noted to have extreme fatigue/to a point that he could not get up and move around.  Noted to have chest tightness.  Also very anxious.  Otherwise no fever no chills.  Patient complains of  mild right-sided pain.  Otherwise denies any headaches nausea vomiting.  Positive for constipation   Review of Systems  Constitutional:  Positive for malaise/fatigue. Negative  for chills, diaphoresis, fever and weight loss.  HENT:  Positive for hearing loss. Negative for nosebleeds and sore throat.   Eyes:  Negative for double vision.  Respiratory:  Negative for cough, hemoptysis, sputum production, shortness of breath and wheezing.   Cardiovascular:  Negative for chest pain, palpitations, orthopnea and leg swelling.  Gastrointestinal:  Positive for constipation. Negative for abdominal pain, blood in stool, diarrhea, heartburn, melena, nausea and vomiting.  Genitourinary:  Negative for dysuria, frequency and urgency.  Musculoskeletal:  Positive for joint pain. Negative for back pain.  Skin: Negative.  Negative for itching and rash.  Neurological:  Negative for dizziness, tingling, focal weakness, weakness and headaches.  Endo/Heme/Allergies:  Does not bruise/bleed easily.  Psychiatric/Behavioral:  Negative for depression. The patient is nervous/anxious. The patient does not have insomnia.     MEDICAL HISTORY:  Past Medical History:  Diagnosis Date   Anxiety    associated with medical care,  worsened by difficulty hearing, does better with wife present   Complication of anesthesia    wife states very anxious may need pre sedation   Coronary artery disease    GERD (gastroesophageal reflux disease)    Hearing loss    mild per wife   Hearing loss    no hearing aids per wife   Hyperlipidemia    Hypertension    MI (myocardial infarction) ([place]) 04/17/2006   Acute inferolateral wall MI with cardiogenic shock and complete heart block   Primary adenocarcinoma of upper lobe of right lung ([place])    Prostate cancer ([place])    Tobacco abuse    Wears glasses    Wears partial dentures    Upper    SURGICAL HISTORY: Past Surgical History:  Procedure Laterality Date   BRONCHIAL BIOPSY  10/09/2021   Procedure: BRONCHIAL BIOPSIES;  Surgeon: Collene Gobble, MD;  Location: [facility];  Service: Pulmonary;;   BRONCHIAL BRUSHINGS  10/09/2021   Procedure: BRONCHIAL  BRUSHINGS;  Surgeon: Collene Gobble, MD;  Location: [facility] ENDOSCOPY;  Service: Pulmonary;;   BRONCHIAL NEEDLE ASPIRATION BIOPSY  10/09/2021   Procedure: BRONCHIAL NEEDLE ASPIRATION BIOPSIES;  Surgeon: Collene Gobble, MD;  Location: [facility]OSCOPY;  Service: Pulmonary;;   CARDIAC CATHETERIZATION  2007   left, RCA 100% occluded ruptured plaque with thrombus in the proximal segment   CYST EXCISION N/A 08/30/2021   Procedure: EXCISION OF POSTERIOR SCALP CYST;  Surgeon: Clovis Riley, MD;  Location: [facility];  Service: General;  Laterality: N/A;   CYSTOSCOPY N/A 07/17/2018   Procedure: Consuela Mimes;  Surgeon: Franchot Gallo, MD;  Location: [facility];  Service: Urology;  Laterality: N/A;  no seeds in bladder per Dr Diona Fanti   FIDUCIAL MARKER PLACEMENT  10/09/2021   Procedure: FIDUCIAL MARKER PLACEMENT;  Surgeon: Collene Gobble, MD;  Location: [facility] ENDOSCOPY;  Service: Pulmonary;;   HAND SURGERY Right    has metal plate in arm   PROSTATE BIOPSY     RADIOACTIVE SEED IMPLANT N/A 07/17/2018   Procedure: RADIOACTIVE SEED IMPLANT/BRACHYTHERAPY IMPLANT;  Surgeon: Franchot Gallo, MD;  Location: [facility];  Service: Urology;  Laterality: N/A;  77 seeds   SPACE OAR INSTILLATION N/A 07/17/2018   Procedure: SPACE OAR INSTILLATION;  Surgeon: Franchot Gallo, MD;  Location: [facility];  Service: Urology;  Laterality: N/A;   VIDEO BRONCHOSCOPY  WITH RADIAL ENDOBRONCHIAL ULTRASOUND  10/09/2021   Procedure: VIDEO BRONCHOSCOPY WITH RADIAL ENDOBRONCHIAL ULTRASOUND;  Surgeon: Collene Gobble, MD;  Location: [facility] ENDOSCOPY;  Service: Pulmonary;;   WRIST SURGERY  10/04/2012    SOCIAL HISTORY: Social History   Socioeconomic History   Marital status: Married    Spouse name: Not on file   Number of children: Not on file   Years of education: Not on file   Highest education level: Not on file  Occupational History   Not on file  Tobacco Use   Smoking status:  Every Day    Packs/day: 1.50    Years: 42.00    Pack years: 63.00    Types: Cigarettes   Smokeless tobacco: Never  Vaping Use   Vaping Use: Never used  Substance and Sexual Activity   Alcohol use: No   Drug use: No   Sexual activity: Yes    Birth control/protection: None  Other Topics Concern   Not on file  Social History Narrative   10-04 19 Unable to ask abuse questions wife with him today.   Social Determinants of Health   Financial Resource Strain: Not on file  Food Insecurity: Not on file  Transportation Needs: Not on file  Physical Activity: Not on file  Stress: Not on file  Social Connections: Not on file  Intimate Partner Violence: Not on file    FAMILY HISTORY: Family History  Problem Relation Age of Onset   Breast cancer Mother    Prostate cancer Neg Hx    Kidney cancer Neg Hx    Cancer Neg Hx     ALLERGIES:  has No Known Allergies.  MEDICATIONS:  Current Outpatient Medications  Medication Sig Dispense Refill   ALPRAZolam (XANAX) 0.25 MG tablet Take 1 tablet (0.25 mg total) by mouth at bedtime. 15 tablet 0   atorvastatin (LIPITOR) 20 MG tablet TAKE 1 TABLET BY MOUTH EVERY DAY 90 tablet 3   clopidogrel (PLAVIX) 75 MG tablet Take 1 tablet (75 mg total) by mouth daily. TAKE 1 TABLET (75 MG TOTAL) BY MOUTH DAILY. Okay to restart this medication on 10/10/2021. 90 tablet 3   folic acid (FOLVITE) 1 MG tablet Take 1 tablet (1 mg total) by mouth daily. 30 tablet 4   ketoconazole (NIZORAL) 2 % cream Apply 1 application topically 2 (two) times daily as needed for irritation.     metoprolol tartrate (LOPRESSOR) 25 MG tablet Take 1 tablet (25 mg total) by mouth daily. 30 tablet 11   omeprazole (PRILOSEC OTC) 20 MG tablet Take 20 mg by mouth daily.     prochlorperazine (COMPAZINE) 10 MG tablet Take 1 tablet (10 mg total) by mouth every 6 (six) hours as needed for nausea or vomiting. 30 tablet 0   tamsulosin (FLOMAX) 0.4 MG CAPS capsule Take 1 capsule (0.4 mg total) by  mouth daily. 30 capsule 3   No current facility-administered medications for this visit.      Marland Kitchen  PHYSICAL EXAMINATION: ECOG PERFORMANCE STATUS: 1 - Symptomatic but completely ambulatory  Vitals:   11/06/21 1119  BP: 131/86  Pulse: 68  Temp: 98.2 F (36.8 C)  SpO2: 98%   Filed Weights   11/06/21 1119  Weight: 242 lb 6.4 oz (110 kg)    Physical Exam Vitals and nursing note reviewed.  HENT:     Head: Normocephalic and atraumatic.     Mouth/Throat:     Pharynx: Oropharynx is clear.  Eyes:     Extraocular Movements: Extraocular  movements intact.     Pupils: Pupils are equal, round, and reactive to light.  Cardiovascular:     Rate and Rhythm: Normal rate and regular rhythm.  Pulmonary:     Comments: Decreased breath sounds bilaterally.  Abdominal:     Palpations: Abdomen is soft.  Musculoskeletal:        General: Normal range of motion.     Cervical back: Normal range of motion.  Skin:    General: Skin is warm.  Neurological:     General: No focal deficit present.     Mental Status: He is alert and oriented to person, place, and time.  Psychiatric:        Behavior: Behavior normal.        Judgment: Judgment normal.     LABORATORY DATA:  I have reviewed the data as listed Lab Results  Component Value Date   WBC 6.1 11/06/2021   HGB 16.2 11/06/2021   HCT 47.0 11/06/2021   MCV 87.7 11/06/2021   PLT 210 11/06/2021   Recent Labs    10/11/21 1257 10/31/21 1107 11/06/21 1258  NA 142 137 136  K 3.9 3.8 3.9  CL 107 106 103  CO2 _0 GLUCOSE 89 113* 126*  BUN _1 CREATININE 1.07 1.08 0.98  CALCIUM 8.8* 9.4 8.7*  GFRNONAA >60 >60 >60  PROT 7.2 7.5 7.3  ALBUMIN 4.2 4.4 4.1  AST _2 ALT _3 ALKPHOS 75 81 76  BILITOT 0.6 0.7 0.7    RADIOGRAPHIC STUDIES: I have personally reviewed the radiological images as listed and agreed with the findings in the report. MR BRAIN W WO CONTRAST  Result Date: 10/15/2021 CLINICAL DATA:   Non-small cell lung cancer.  Staging. EXAM: MRI HEAD WITHOUT AND WITH CONTRAST TECHNIQUE: Multiplanar, multiecho pulse sequences of the brain and surrounding structures were obtained without and with intravenous contrast. CONTRAST:  20m GADAVIST GADOBUTROL 1 MMOL/ML IV SOLN COMPARISON:  Head CT 12/12/2015 FINDINGS: Brain: Diffusion imaging does not show any acute or subacute infarction or other cause of restricted diffusion. No abnormality affects the brainstem or cerebellum. Cerebral hemispheres show a few punctate foci of T2 and FLAIR signal in the white matter, often seen at this age. No cortical or large vessel territory infarction. No evidence of primary or metastatic mass lesion, hemorrhage, hydrocephalus or extra-axial collection. After contrast administration, no abnormal enhancement occurs. Vascular: Major vessels at the base of the brain show flow. Skull and upper cervical spine: Negative Sinuses/Orbits: Retention cyst in the left maxillary sinus. Mastoid effusion on the right. Other: None IMPRESSION: No evidence of metastatic disease. Few punctate foci of T2 and FLAIR signal in the hemispheric white matter, consistent with minimal small vessel change. Right mastoid effusion, extensive.  No causative feature identified. Electronically Signed   By: MNelson ChimesM.D.   On: 10/15/2021 16:57   DG Chest Port 1 View  Result Date: 10/09/2021 CLINICAL DATA:  Post bronchoscopy. EXAM: PORTABLE CHEST 1 VIEW COMPARISON:  01/05/2021 and CT chest 10/06/2021. FINDINGS: Trachea is midline. Heart is enlarged. A fiducial marker is seen at the site of a known right upper lobe nodule. No definite pneumothorax status post bronchoscopic biopsy. There may be streaky atelectasis in both lung bases. No airspace consolidation. No pleural fluid. IMPRESSION: 1. Fiducial marker localizes a known right upper lobe nodule. No definite pneumothorax after bronchoscopic biopsy. 2. Bibasilar streaky atelectasis. Electronically Signed    By: MLorin Picket  M.D.   On: 10/09/2021 13:09   DG C-Arm 1-60 Min-No Report  Result Date: 10/09/2021 Fluoroscopy was utilized by the requesting physician.  No radiographic interpretation.   DG C-ARM BRONCHOSCOPY  Result Date: 10/09/2021 C-ARM BRONCHOSCOPY: Fluoroscopy was utilized by the requesting physician.  No radiographic interpretation.    ASSESSMENT & PLAN:   Primary adenocarcinoma of upper lobe of right lung ([place]) #Non-small cell lung cancer/stage IV-favor adeno carcinoma-metastases to right femur/liver; synchronous squamous cell carcinoma scalp [see below].Guardant 360 showed positive KRAS G12C mutation.   #Patient currently s/p carbo-Alimta-Libtayo [cycle #1]- on Jan 4th-tolerated treatment poorly/see below.  #Proceed with cycle #2 as planned on Jan 25th; however in [facility]/pt-family preference.  We will plan imaging after 2 cycles/prior to cycle #3.  #Extreme fatigue/chest tightness-post chemo-currently improving.  Await labs from today-likely secondary to chemotherapy/extreme anxiety.  Recommend continue Xanax as ordered.  Also recommend evaluation with palliative care for further management of anxiety/? Buspar.   #Right proximal femoral lesion-symptomatic awaiting radiation.  Also SBRT of the right upper lobe lung lesion-as per radiation oncology. Zometa candidate in future  #Squamous cell carcinoma of the scalp-s/p excision; positive margins-how is in the context of metastatic lung cancer; and the fact that Libtayo also treats squamous cell carcinoma of the scalp-I think is reasonable to monitor for now-without any further surgical interventions.  # Constipation- ~ 1 week- Miralax BID  [started in yesterday]-recommend increase fluid intake.  # IV access: Wants to hold off the port placement at this time.  #Prognosis: I had a long discussion with patient and wife regarding unfortunate incurable nature of the disease; and he also understands his treatments indefinite.  Given  the proximity-patient/wife prefers treatments in [place].  I have reached out to Dr. Marijo Conception kindly agrees with transfer care to [facility].  Thank you Dr. Julien Nordmann for allowing me to participate in the care of your pleasant patient. Please do not hesitate to contact me with questions or concerns in the interim.  Discussed with Dr. Julien Nordmann.   # DISPOSITION: # labs today- cbc/cmp # 1 week- referral to Palliative care, JOsh- next week; labs- cbc/bmp # follow up on JAN 25th- MD;labs- cbc/cmp- carboAlimta-Libtayo- - - Dr.B  # 60 minutes face-to-face with the patient discussing the above plan of care; more than 50% of time spent on prognosis/ natural history; counseling and coordination.     All questions were answered. The patient knows to call the clinic with any problems, questions or concerns.       Cammie Sickle, MD 11/06/2021 3:33 PM

## 2021-11-06 NOTE — Assessment & Plan Note (Addendum)
#  Non-small cell lung cancer/stage IV-favor adeno carcinoma-metastases to right femur/liver; synchronous squamous cell carcinoma scalp [see below].Guardant 360 showed positive KRAS G12C mutation.   #Patient currently s/p carbo-Alimta-Libtayo [cycle #1]- on Jan 4th-tolerated treatment poorly/see below.  #Proceed with cycle #2 as planned on Jan 25th; however in [facility]/pt-family preference.  We will plan imaging after 2 cycles/prior to cycle #3.  #Extreme fatigue/chest tightness-post chemo-currently improving.  Await labs from today-likely secondary to chemotherapy/extreme anxiety.  Recommend continue Xanax as ordered.  Also recommend evaluation with palliative care for further management of anxiety/? Buspar.   #Right proximal femoral lesion-symptomatic awaiting radiation.  Also SBRT of the right upper lobe lung lesion-as per radiation oncology. Zometa candidate in future  #Squamous cell carcinoma of the scalp-s/p excision; positive margins-how is in the context of metastatic lung cancer; and the fact that Libtayo also treats squamous cell carcinoma of the scalp-I think is reasonable to monitor for now-without any further surgical interventions.  # Constipation- ~ 1 week- Miralax BID  [started in yesterday]-recommend increase fluid intake.  # IV access: Wants to hold off the port placement at this time.  #Prognosis: I had a long discussion with patient and wife regarding unfortunate incurable nature of the disease; and he also understands his treatments indefinite.  Given the proximity-patient/wife prefers treatments in [place].  I have reached out to Dr. Marijo Conception kindly agrees with transfer care to [facility].  Thank you Dr. Julien Nordmann for allowing me to participate in the care of your pleasant patient. Please do not hesitate to contact me with questions or concerns in the interim.  Discussed with Dr. Julien Nordmann.   # DISPOSITION: # labs today- cbc/cmp # 1 week- referral to Palliative care, JOsh- next  week; labs- cbc/bmp # follow up on JAN 25th- MD;labs- cbc/cmp- carboAlimta-Libtayo- - - Dr.B  # 60 minutes face-to-face with the patient discussing the above plan of care; more than 50% of time spent on prognosis/ natural history; counseling and coordination.

## 2021-11-06 NOTE — Progress Notes (Signed)
Pt states has a rash on his abdomen since starting chemo.  Murelax not helping constipation.  Having some sob since starting chemo.

## 2021-11-07 ENCOUNTER — Telehealth: Payer: Self-pay | Admitting: *Deleted

## 2021-11-07 ENCOUNTER — Encounter: Payer: Self-pay | Admitting: Internal Medicine

## 2021-11-07 ENCOUNTER — Ambulatory Visit
Admission: RE | Admit: 2021-11-07 | Discharge: 2021-11-07 | Disposition: A | Discharge status: Home / Self Care | Payer: BC Managed Care – PPO | Source: Ambulatory Visit | Attending: Radiation Oncology | Admitting: Radiation Oncology

## 2021-11-07 ENCOUNTER — Ambulatory Visit: Payer: BC Managed Care – PPO | Admitting: Physician Assistant

## 2021-11-07 ENCOUNTER — Encounter: Payer: Self-pay | Admitting: *Deleted

## 2021-11-07 DIAGNOSIS — Z5112 Encounter for antineoplastic immunotherapy: Secondary | ICD-10-CM | POA: Diagnosis not present

## 2021-11-07 DIAGNOSIS — Z87891 Personal history of nicotine dependence: Secondary | ICD-10-CM | POA: Diagnosis not present

## 2021-11-07 DIAGNOSIS — C7951 Secondary malignant neoplasm of bone: Secondary | ICD-10-CM | POA: Diagnosis not present

## 2021-11-07 DIAGNOSIS — Z515 Encounter for palliative care: Secondary | ICD-10-CM | POA: Diagnosis not present

## 2021-11-07 DIAGNOSIS — F419 Anxiety disorder, unspecified: Secondary | ICD-10-CM | POA: Diagnosis not present

## 2021-11-07 DIAGNOSIS — Z5111 Encounter for antineoplastic chemotherapy: Secondary | ICD-10-CM | POA: Diagnosis not present

## 2021-11-07 DIAGNOSIS — C3411 Malignant neoplasm of upper lobe, right bronchus or lung: Secondary | ICD-10-CM | POA: Diagnosis not present

## 2021-11-07 NOTE — Progress Notes (Signed)
Oncology Nurse Navigator Documentation  Oncology Nurse Navigator Flowsheets 11/07/2021 10/24/2021 10/13/2021 10/03/2021 08/01/2018 05/30/2018 05/05/2018  Abnormal Finding Date - - - 09/20/2021 - - -  Confirmed Diagnosis Date - - 10/09/2021 - - - -  Diagnosis Status - - Confirmed Diagnosis Complete Additional Work Up - - -  Planned Course of Treatment - - Chemotherapy;Targeted Therapy;Radiation - - - -  Phase of Treatment - Radiation Radiation - - - -  Chemotherapy Actual Start Date: - 10/18/2021 - - - - -  Radiation Actual Start Date: - 10/18/2021 - - - - -  Expected Surgery Date - - - - 07/17/2018 - -  Navigator Follow Up Date: - 10/26/2021 10/24/2021 10/05/2021 - - -  Navigator Follow Up Reason: - Appointment Review Appointment Review;Review Note Appointment Review - - -  Navigation Complete Date: 11/07/2021 - - - - - -  Post Navigation: Continue to Follow Patient? No - - - - - -  Navigator Location [facility]  Referral Date to RadOnc/MedOnc - - - 10/03/2021 - - -  Navigator Encounter Type Other:/I received a message from Dr. Julien Nordmann that patient is going to get his tx at [facility]. No need for thoracic navigation at [facility] to track patient.  Other: Other: Other: Treatment Treatment Telephone  Telephone - - - - - - Incoming Call  Treatment Initiated Date - 10/18/2021 - - - 05/30/2018 -  Patient Visit Type - Other Other Other - - -  Treatment Phase - Treatment Pre-Tx/Tx Discussion Abnormal Scans CT SIM;Post-Tx Follow-up CT SIM -  Barriers/Navigation Needs - Coordination of Care Coordination of Care Coordination of Care Education Education Education  Education - - - - - Preparing for Upcoming Surgery/ Treatment Pain/ Symptom Management;Preparing for Upcoming Surgery/ Treatment  Interventions - Coordination of Care Coordination of Care Coordination of Care Education Education Education  Acuity  Level 1-No Barriers Level 2-Minimal Needs (1-2 Barriers Identified) Level 2-Minimal Needs (1-2 Barriers Identified) Level 2-Minimal Needs (1-2 Barriers Identified) Level 1 - -  Coordination of Care - - Other Other - - -  Education Method - - - - Teach-back;Verbal Teach-back;Verbal;Written Teach-back;Verbal  Support Groups/Services - - - - - - -  Time Spent with Patient 15 30 30 30 15 15  15

## 2021-11-07 NOTE — Progress Notes (Signed)
I left a voicemail for the patients wife . unable to reach. Recommend evaluation in the symptom management clinic if rash is symptomatic. Otherwise recommend evaluation with Josh next week as planned/PalliativeCare visit.  Recommend call us back if any questions or concerns.

## 2021-11-07 NOTE — Telephone Encounter (Signed)
Spoke with the patients wife to see how he was feeling.  She reports he was at work earlier and felt "chilled" and nauseous and went home to rest.  She talked with him and he was feeling a little better.  She reports he has not been taking his compazine.  Denies any other symptoms.  Providers made aware.  He was advised to come for his radiation treatment today.  She said she would give [place] a call if he could not make it.  Gloriajean Dell. Leonie Green, BSN

## 2021-11-08 ENCOUNTER — Telehealth: Payer: Self-pay | Admitting: Medical Oncology

## 2021-11-08 NOTE — Telephone Encounter (Signed)
[employer] called to verify if pt received chemotherapy on 11/01/2021. I verified .

## 2021-11-09 ENCOUNTER — Inpatient Hospital Stay: Payer: BC Managed Care – PPO

## 2021-11-09 ENCOUNTER — Other Ambulatory Visit: Payer: Self-pay

## 2021-11-09 ENCOUNTER — Ambulatory Visit
Admission: RE | Admit: 2021-11-09 | Discharge: 2021-11-09 | Disposition: A | Discharge status: Home / Self Care | Payer: BC Managed Care – PPO | Source: Ambulatory Visit | Attending: Radiation Oncology | Admitting: Radiation Oncology

## 2021-11-09 DIAGNOSIS — C3411 Malignant neoplasm of upper lobe, right bronchus or lung: Secondary | ICD-10-CM | POA: Diagnosis not present

## 2021-11-09 DIAGNOSIS — Z5112 Encounter for antineoplastic immunotherapy: Secondary | ICD-10-CM | POA: Diagnosis not present

## 2021-11-09 DIAGNOSIS — F419 Anxiety disorder, unspecified: Secondary | ICD-10-CM | POA: Diagnosis not present

## 2021-11-09 DIAGNOSIS — Z5111 Encounter for antineoplastic chemotherapy: Secondary | ICD-10-CM | POA: Diagnosis not present

## 2021-11-09 DIAGNOSIS — Z515 Encounter for palliative care: Secondary | ICD-10-CM | POA: Diagnosis not present

## 2021-11-12 ENCOUNTER — Encounter: Payer: Self-pay | Admitting: Radiation Oncology

## 2021-11-13 ENCOUNTER — Ambulatory Visit
Admission: RE | Admit: 2021-11-13 | Discharge: 2021-11-13 | Disposition: A | Discharge status: Home / Self Care | Payer: BC Managed Care – PPO | Source: Ambulatory Visit | Attending: Radiation Oncology | Admitting: Radiation Oncology

## 2021-11-13 ENCOUNTER — Inpatient Hospital Stay: Payer: BC Managed Care – PPO

## 2021-11-13 ENCOUNTER — Encounter: Payer: Self-pay | Admitting: Hospice and Palliative Medicine

## 2021-11-13 ENCOUNTER — Inpatient Hospital Stay (HOSPITAL_BASED_OUTPATIENT_CLINIC_OR_DEPARTMENT_OTHER): Payer: BC Managed Care – PPO | Admitting: Hospice and Palliative Medicine

## 2021-11-13 ENCOUNTER — Other Ambulatory Visit: Payer: Self-pay

## 2021-11-13 VITALS — BP 131/73 | HR 67 | Temp 97.1°F | Resp 16

## 2021-11-13 DIAGNOSIS — C3411 Malignant neoplasm of upper lobe, right bronchus or lung: Secondary | ICD-10-CM | POA: Diagnosis not present

## 2021-11-13 DIAGNOSIS — Z515 Encounter for palliative care: Secondary | ICD-10-CM

## 2021-11-13 DIAGNOSIS — Z5111 Encounter for antineoplastic chemotherapy: Secondary | ICD-10-CM | POA: Diagnosis not present

## 2021-11-13 DIAGNOSIS — F419 Anxiety disorder, unspecified: Secondary | ICD-10-CM | POA: Diagnosis not present

## 2021-11-13 DIAGNOSIS — Z5112 Encounter for antineoplastic immunotherapy: Secondary | ICD-10-CM | POA: Diagnosis not present

## 2021-11-13 LAB — CBC WITH DIFFERENTIAL/PLATELET
Abs Immature Granulocytes: 0.01 10*3/uL (ref 0.00–0.07)
Basophils Absolute: 0 10*3/uL (ref 0.0–0.1)
Basophils Relative: 0 %
Eosinophils Absolute: 0 10*3/uL (ref 0.0–0.5)
Eosinophils Relative: 0 %
HCT: 47.6 % (ref 39.0–52.0)
Hemoglobin: 16.3 g/dL (ref 13.0–17.0)
Immature Granulocytes: 0 %
Lymphocytes Relative: 28 %
Lymphs Abs: 1.5 10*3/uL (ref 0.7–4.0)
MCH: 29.9 pg (ref 26.0–34.0)
MCHC: 34.2 g/dL (ref 30.0–36.0)
MCV: 87.3 fL (ref 80.0–100.0)
Monocytes Absolute: 0.8 10*3/uL (ref 0.1–1.0)
Monocytes Relative: 16 %
Neutro Abs: 2.8 10*3/uL (ref 1.7–7.7)
Neutrophils Relative %: 56 %
Platelets: 182 10*3/uL (ref 150–400)
RBC: 5.45 MIL/uL (ref 4.22–5.81)
RDW: 13.5 % (ref 11.5–15.5)
WBC: 5.2 10*3/uL (ref 4.0–10.5)
nRBC: 0 % (ref 0.0–0.2)

## 2021-11-13 LAB — BASIC METABOLIC PANEL
Anion gap: 9 (ref 5–15)
BUN: 19 mg/dL (ref 6–20)
CO2: 22 mmol/L (ref 22–32)
Calcium: 9.1 mg/dL (ref 8.9–10.3)
Chloride: 103 mmol/L (ref 98–111)
Creatinine, Ser: 0.84 mg/dL (ref 0.61–1.24)
GFR, Estimated: >60 mL/min (ref >60)
Glucose, Bld: 114 mg/dL — ABNORMAL HIGH (ref 70–99)
Potassium: 3.9 mmol/L (ref 3.5–5.1)
Sodium: 134 mmol/L — ABNORMAL LOW (ref 135–145)

## 2021-11-13 LAB — TSH: TSH: 1.866 u[IU]/mL (ref 0.350–4.500)

## 2021-11-13 MED ORDER — SERTRALINE HCL 50 MG PO TABS: 50.0000 mg | ORAL_TABLET | Freq: Every day | ORAL | 0 refills | Status: DC

## 2021-11-13 MED ORDER — ALPRAZOLAM 0.25 MG PO TABS: 0.2500 mg | ORAL_TABLET | Freq: Every day | ORAL | 0 refills | Status: DC

## 2021-11-13 NOTE — Progress Notes (Signed)
[place] at [facility] [phone] Fax:(336) [phone]   Name: Chris [name] Date: 11/13/2021 MRN: [id number]  DOB: [birth year]  Patient Care Team: Pcp, No as PCP - General Lorretta Harp, MD as PCP - Cardiology (Cardiology) Curt Bears, MD as Consulting Physician (Oncology) Casilda Carls, MD as Referring Physician (Internal Medicine) Cammie Sickle, MD as Consulting Physician (Internal Medicine) Cammie Sickle, MD as Consulting Physician (Internal Medicine)    REASON FOR CONSULTATION: Chris [name] is a 61 y.o. male with multiple medical problems including stage IV non-small cell lung cancer with metastasis to femur/liver diagnosed in December 2022.  Patient is currently on treatment with carbo/Alimta/Libtayo but has had poor tolerance to treatment.  He is referred to radiation oncology for SBRT of right upper lobe lung lesion and right proximal femoral lesion.  Patient has had severe anxiety and was referred to palliative care to help address goals and manage ongoing symptoms.  SOCIAL HISTORY:     reports that he has been smoking cigarettes. He has a 63.00 pack-year smoking history. He has never used smokeless tobacco. He reports that he does not drink alcohol and does not use drugs.  Patient is married and lives at home with his wife.  They have a son and recently had grandchildren.  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  ADVANCE DIRECTIVES:  Not on file  CODE STATUS:   PAST MEDICAL HISTORY: Past Medical History:  Diagnosis Date   Anxiety    associated with medical care,  worsened by difficulty hearing, does better with wife present   Complication of anesthesia    wife states very anxious may need pre sedation   Coronary artery disease    GERD (gastroesophageal reflux disease)    Hearing loss    mild per wife   Hearing loss    no hearing aids per wife   Hyperlipidemia    Hypertension     MI (myocardial infarction) ([place]) 04/17/2006   Acute inferolateral wall MI with cardiogenic shock and complete heart block   Primary adenocarcinoma of upper lobe of right lung ([place])    Prostate cancer ([place])    Tobacco abuse    Wears glasses    Wears partial dentures    Upper    PAST SURGICAL HISTORY:  Past Surgical History:  Procedure Laterality Date   BRONCHIAL BIOPSY  10/09/2021   Procedure: BRONCHIAL BIOPSIES;  Surgeon: Collene Gobble, MD;  Location: [facility];  Service: Pulmonary;;   BRONCHIAL BRUSHINGS  10/09/2021   Procedure: BRONCHIAL BRUSHINGS;  Surgeon: Collene Gobble, MD;  Location: [facility];  Service: Pulmonary;;   BRONCHIAL NEEDLE ASPIRATION BIOPSY  10/09/2021   Procedure: BRONCHIAL NEEDLE ASPIRATION BIOPSIES;  Surgeon: Collene Gobble, MD;  Location: [facility];  Service: Pulmonary;;   CARDIAC CATHETERIZATION  2007   left, RCA 100% occluded ruptured plaque with thrombus in the proximal segment   CYST EXCISION N/A 08/30/2021   Procedure: EXCISION OF POSTERIOR SCALP CYST;  Surgeon: Clovis Riley, MD;  Location: [facility];  Service: General;  Laterality: N/A;   CYSTOSCOPY N/A 07/17/2018   Procedure: Consuela Mimes;  Surgeon: Franchot Gallo, MD;  Location: [facility];  Service: Urology;  Laterality: N/A;  no seeds in bladder per Dr Diona Fanti   FIDUCIAL MARKER PLACEMENT  10/09/2021   Procedure: FIDUCIAL MARKER PLACEMENT;  Surgeon: Collene Gobble, MD;  Location: [facility] END[facility]COPY;  Service: Pulmonary;;   HAND SURGERY Right  has metal plate in arm   PROSTATE BIOPSY     RADIOACTIVE SEED IMPLANT N/A 07/17/2018   Procedure: RADIOACTIVE SEED IMPLANT/BRACHYTHERAPY IMPLANT;  Surgeon: Franchot Gallo, MD;  Location[facility];  Service: Urology;  Laterality: N/A;  77 seeds   SPACE OAR INSTILLATION N/A 07/17/2018   Procedure: SPACE OAR INSTILLATION;  Surgeon: Franchot Gallo, MD;  Location: [facility];  Service:  Urology;  Laterality: N/A;   VIDEO BRONCHOSCOPY WITH RADIAL ENDOBRONCHIAL ULTRASOUND  10/09/2021   Procedure: VIDEO BRONCHOSCOPY WITH RADIAL ENDOBRONCHIAL ULTRASOUND;  Surgeon: Collene Gobble, MD;  Location: [facility];  Service: Pulmonary;;   WRIST SURGERY  10/04/2012    HEMATOLOGY/ONCOLOGY HISTORY:  Oncology History Overview Note  DIAGNOSIS: 1) stage IV (T1c, N0, M1 C) non-small cell lung cancer favoring adenocarcinoma presented with right lung apical nodule in addition to metastatic disease in the right hepatic lobe and the proximal right femoral diaphysis diagnosed and December 2022. 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 LUNG, RUL, FINE NEEDLE ASPIRATION:  - Malignant cells consistent with non-small cell carcinoma, see comment   B. LUNG, RUL, BRUSHING:  - Malignant cells consistent with non-small cell carcinoma, see comment       COMMENT:   A and B.  Dr. Saralyn Pilar reviewed the case and concurs with the diagnosis.  Only rare malignant cells are present on the cellblock.  Immunohistochemical stains were attempted and show that the tumor cells  have patchy staining for TTF-1 whereas p63, p40 and CK5/6 are negative.  The findings are nondiagnostic but suggestive of a lung adenocarcinoma.  Dr. Lamonte Sakai was notified on 10/13/2021.   SEP-OCT 2022-  [Dermatology]-   Poorly differentiated squamous cell carcinoma; -  Carcinoma extends to the edges of the excision specimen   IMPRESSION: 1. The spiculated nodule at the right lung apex on recent neck CT is hypermetabolic and is concerning for primary bronchogenic carcinoma. Tissue sampling recommended. 2. Hypermetabolic activity within the occipital scalp and small previously demonstrated right occipital lymph node compatible with known squamous cell  carcinoma. Evaluation of the head and neck limited by motion artifact. 3. Hypermetabolic lesions inferiorly in the right hepatic lobe and in the proximal right femoral diaphysis suspicious for metastatic disease, primary uncertain in this patient with a history of prostate cancer. Correlate with PSA levels. Abdominal MRI without and with contrast may be helpful for further characterization of the liver lesion.  # 2007MI-CAD [s/p stent-Dr.Berry; EF 2021- 58%]     Primary adenocarcinoma of upper lobe of right lung ([place])  10/18/2021 Initial Diagnosis   Primary adenocarcinoma of upper lobe of right lung ([place])   10/18/2021 Cancer Staging   Staging form: Lung, AJCC 8th Edition - Clinical: Stage IVB (cT1c, cN0, cM1c) - Signed by Curt Bears, MD on 10/18/2021    11/01/2021 -  Chemotherapy   Patient is on Treatment Plan : LUNG NSCLC Pemetrexed + Carboplatin q21d x 4 Cycles       ALLERGIES:  has No Known Allergies.  MEDICATIONS:  Current Outpatient Medications  Medication Sig Dispense Refill   ALPRAZolam (XANAX) 0.25 MG tablet Take 1 tablet (0.25 mg total) by mouth at bedtime. 15 tablet 0   atorvastatin (LIPITOR) 20 MG tablet TAKE 1 TABLET BY MOUTH EVERY DAY 90 tablet 3   clopidogrel (PLAVIX) 75 MG tablet Take 1  tablet (75 mg total) by mouth daily. TAKE 1 TABLET (75 MG TOTAL) BY MOUTH DAILY. Okay to restart this medication on 10/10/2021. 90 tablet 3   folic acid (FOLVITE) 1 MG tablet Take 1 tablet (1 mg total) by mouth daily. 30 tablet 4   ketoconazole (NIZORAL) 2 % cream Apply 1 application topically 2 (two) times daily as needed for irritation.     metoprolol tartrate (LOPRESSOR) 25 MG tablet Take 1 tablet (25 mg total) by mouth daily. 30 tablet 11   omeprazole (PRILOSEC OTC) 20 MG tablet Take 20 mg by mouth daily.     prochlorperazine (COMPAZINE) 10 MG tablet Take 1 tablet (10 mg total) by mouth every 6 (six) hours as needed for nausea or vomiting. 30 tablet 0   tamsulosin  (FLOMAX) 0.4 MG CAPS capsule Take 1 capsule (0.4 mg total) by mouth daily. 30 capsule 3   No current facility-administered medications for this visit.    VITAL SIGNS: There were no vitals taken for this visit. There were no vitals filed for this visit.  Estimated body mass index is 33.81 kg/m as calculated from the following:   Height as of 11/06/21: 5' 11" (1.803 m).   Weight as of 11/06/21: 242 lb 6.4 oz (110 kg).  LABS: CBC:    Component Value Date/Time   WBC 6.1 11/06/2021 1258   HGB 16.2 11/06/2021 1258   HGB 16.4 10/31/2021 1107   HCT 47.0 11/06/2021 1258   PLT 210 11/06/2021 1258   PLT 217 10/31/2021 1107   MCV 87.7 11/06/2021 1258   NEUTROABS 4.0 11/06/2021 1258   LYMPHSABS 1.7 11/06/2021 1258   MONOABS 0.3 11/06/2021 1258   EOSABS 0.1 11/06/2021 1258   BASOSABS 0.0 11/06/2021 1258   Comprehensive Metabolic Panel:    Component Value Date/Time   NA 136 11/06/2021 1258   K 3.9 11/06/2021 1258   CL 103 11/06/2021 1258   CO2 25 11/06/2021 1258   BUN 17 11/06/2021 1258   CREATININE 0.98 11/06/2021 1258   CREATININE 1.08 10/31/2021 1107   GLUCOSE 126 (H) 11/06/2021 1258   CALCIUM 8.7 (L) 11/06/2021 1258   AST 20 11/06/2021 1258   AST 15 10/31/2021 1107   ALT 25 11/06/2021 1258   ALT 17 10/31/2021 1107   ALKPHOS 76 11/06/2021 1258   BILITOT 0.7 11/06/2021 1258   BILITOT 0.7 10/31/2021 1107   PROT 7.3 11/06/2021 1258   ALBUMIN 4.1 11/06/2021 1258    RADIOGRAPHIC STUDIES: No results found.  PERFORMANCE STATUS (ECOG) : 1 - Symptomatic but completely ambulatory  Review of Systems Unless otherwise noted, a complete review of systems is negative.  Physical Exam General: NAD Pulmonary: Unlabored Extremities: no edema, no joint deformities Skin: Scaly, erythematous rash beneath pannus Neurological: Weakness but otherwise nonfocal  IMPRESSION: I met with patient and wife today in clinic.  Introduced palliative care services and attempted to establish  therapeutic rapport.  Patient recognizes that he has stage IV lung cancer, which ultimately is incurable.  He is in agreement with current scope of treatment but stated repeatedly that he reserve the option of discontinuing treatment if it made him feel poorly or negatively impacted his quality of life.  It is significantly important to the patient that he maintains his functional status and purpose through work.  He does not want to "lay in the bed."  Symptomatically, he endorses anxiety but denies depression.  He is taking alprazolam at bedtime, which he reports being effective at helping him sleep and  slows down racing thoughts.  Patient is interested in trying an SSRI for anxiety.  His wife is on Zoloft and was interested in trying that for patient.  Patient does have a scaly/erythematous rash beneath his abdominal pannus.  He reports moisture and heat from working.  He does have ketoconazole cream from his dermatologist and we discussed using that as needed.  PLAN: -Continue current scope of treatment -Nurse navigator (spoke with Hayley) -Start Zoloft 50 mg daily -Continue alprazolam 0.25 mg nightly as needed for sleep -RTC next week   Patient expressed understanding and was in agreement with this plan. He also understands that He can call the clinic at any time with any questions, concerns, or complaints.     Time Total: 35 minutes  Visit consisted of counseling and education dealing with the complex and emotionally intense issues of symptom management and palliative care in the setting of serious and potentially life-threatening illness.Greater than 50%  of this time was spent counseling and coordinating care related to the above assessment and plan.  Signed by: Altha Harm, PhD, NP-C

## 2021-11-13 NOTE — Progress Notes (Signed)
Pt here for palliative care consultation. Per wife, pt is anxious and has several questions regarding symptom management, goals of care, and would like for someone to look at a rash today. Pt voices that he feels "like a walking dead person."

## 2021-11-13 NOTE — Progress Notes (Signed)
Pharmacist Chemotherapy Monitoring - Initial Assessment    Anticipated start date: 11/22/21   The following has been reviewed per standard work regarding the patient's treatment regimen: The patient's diagnosis, treatment plan and drug doses, and organ/hematologic function Lab orders and baseline tests specific to treatment regimen  The treatment plan start date, drug sequencing, and pre-medications Prior authorization status  Patient's documented medication list, including drug-drug interaction screen and prescriptions for anti-emetics and supportive care specific to the treatment regimen The drug concentrations, fluid compatibility, administration routes, and timing of the medications to be used The patient's access for treatment and lifetime cumulative dose history, if applicable  The patient's medication allergies and previous infusion related reactions, if applicable   Changes made to treatment plan:  N/A  Follow up needed:  dose adjustment of carbo due to dosing and signing treatment plan and b12 order?   Adelina Mings, [place], 11/13/2021  10:53 AM

## 2021-11-14 ENCOUNTER — Ambulatory Visit: Payer: BC Managed Care – PPO | Admitting: Radiation Oncology

## 2021-11-15 ENCOUNTER — Inpatient Hospital Stay: Payer: BC Managed Care – PPO

## 2021-11-15 ENCOUNTER — Encounter: Payer: Self-pay | Admitting: Radiation Oncology

## 2021-11-15 ENCOUNTER — Ambulatory Visit
Admission: RE | Admit: 2021-11-15 | Discharge: 2021-11-15 | Disposition: A | Discharge status: Home / Self Care | Payer: BC Managed Care – PPO | Source: Ambulatory Visit | Attending: Radiation Oncology | Admitting: Radiation Oncology

## 2021-11-15 ENCOUNTER — Ambulatory Visit: Payer: BC Managed Care – PPO | Admitting: Radiation Oncology

## 2021-11-15 ENCOUNTER — Other Ambulatory Visit: Payer: Self-pay

## 2021-11-15 DIAGNOSIS — C3411 Malignant neoplasm of upper lobe, right bronchus or lung: Secondary | ICD-10-CM | POA: Diagnosis not present

## 2021-11-15 DIAGNOSIS — F419 Anxiety disorder, unspecified: Secondary | ICD-10-CM | POA: Diagnosis not present

## 2021-11-15 DIAGNOSIS — Z5111 Encounter for antineoplastic chemotherapy: Secondary | ICD-10-CM | POA: Diagnosis not present

## 2021-11-15 DIAGNOSIS — Z5112 Encounter for antineoplastic immunotherapy: Secondary | ICD-10-CM | POA: Diagnosis not present

## 2021-11-15 DIAGNOSIS — Z515 Encounter for palliative care: Secondary | ICD-10-CM | POA: Diagnosis not present

## 2021-11-16 ENCOUNTER — Ambulatory Visit: Payer: BC Managed Care – PPO | Admitting: Radiation Oncology

## 2021-11-16 ENCOUNTER — Telehealth: Payer: Self-pay | Admitting: *Deleted

## 2021-11-16 NOTE — Telephone Encounter (Signed)
Spoke with the patient's husband to inform her that due to how his radiation is planned his lung treatments were extended to 5 treatments versus 3.  She was informed she would see Dr. Lisbeth Renshaw tomorrow following his last treatment and he would explain what is next in his treatment plan.  She verbalized understanding.  Gloriajean Dell. Leonie Green, BSN

## 2021-11-17 ENCOUNTER — Other Ambulatory Visit: Payer: Self-pay

## 2021-11-17 ENCOUNTER — Ambulatory Visit: Payer: BC Managed Care – PPO | Admitting: Radiation Oncology

## 2021-11-17 ENCOUNTER — Encounter: Payer: Self-pay | Admitting: Radiation Oncology

## 2021-11-17 ENCOUNTER — Ambulatory Visit
Admission: RE | Admit: 2021-11-17 | Discharge: 2021-11-17 | Disposition: A | Discharge status: Home / Self Care | Payer: BC Managed Care – PPO | Source: Ambulatory Visit | Attending: Radiation Oncology | Admitting: Radiation Oncology

## 2021-11-17 DIAGNOSIS — Z87891 Personal history of nicotine dependence: Secondary | ICD-10-CM | POA: Diagnosis not present

## 2021-11-17 DIAGNOSIS — F419 Anxiety disorder, unspecified: Secondary | ICD-10-CM | POA: Diagnosis not present

## 2021-11-17 DIAGNOSIS — C7951 Secondary malignant neoplasm of bone: Secondary | ICD-10-CM | POA: Diagnosis not present

## 2021-11-17 DIAGNOSIS — Z5112 Encounter for antineoplastic immunotherapy: Secondary | ICD-10-CM | POA: Diagnosis not present

## 2021-11-17 DIAGNOSIS — C3411 Malignant neoplasm of upper lobe, right bronchus or lung: Secondary | ICD-10-CM | POA: Diagnosis not present

## 2021-11-17 DIAGNOSIS — Z5111 Encounter for antineoplastic chemotherapy: Secondary | ICD-10-CM | POA: Diagnosis not present

## 2021-11-17 DIAGNOSIS — Z515 Encounter for palliative care: Secondary | ICD-10-CM | POA: Diagnosis not present

## 2021-11-20 ENCOUNTER — Ambulatory Visit: Payer: BC Managed Care – PPO | Admitting: Radiation Oncology

## 2021-11-21 ENCOUNTER — Encounter: Payer: Self-pay | Admitting: Internal Medicine

## 2021-11-21 NOTE — Progress Notes (Signed)
° °                                                                                                                                                          °  Patient Name: Chris [name] MRN: [id number] DOB: [birth year] Referring Physician: Casilda Carls (Profile Not Attached) Date of Service: 11/17/2021 [facility], [place]                                                        End Of Treatment Note  Diagnoses: C34.11-Malignant neoplasm of upper lobe, right bronchus or lung C44.42-Squamous cell carcinoma of skin of scalp and neck  Cancer Staging: Stage IV, NSCLC, adenocarcinoma of the RUL with bone metastases. Squamous Cell Carcinoma of the scalp.  Intent: Curative  Radiation Treatment Dates: 11/07/2021 through 11/17/2021 Site Technique Total Dose (Gy) Dose per Fx (Gy) Completed Fx Beam Energies  Lung, Right: Lung_R IMRT 60/60 12 5/5 6XFFF  Femur Right: Ext_R IMRT 60/60 12 5/5 6XFFF   Narrative: The patient tolerated radiation therapy relatively well. He did have some tightness in his scalp since this area has not been treated.   Plan: The patient will receive a call in about one month from the radiation oncology department. He will continue follow up with Dr. Julien Nordmann as well. Our plan is to proceed with radiation to his scalp following the conclusion of all chemotherapy for his lung cancer.  ________________________________________________    Carola Rhine, PAC

## 2021-11-22 ENCOUNTER — Inpatient Hospital Stay (HOSPITAL_BASED_OUTPATIENT_CLINIC_OR_DEPARTMENT_OTHER): Payer: BC Managed Care – PPO | Admitting: Internal Medicine

## 2021-11-22 ENCOUNTER — Inpatient Hospital Stay (HOSPITAL_BASED_OUTPATIENT_CLINIC_OR_DEPARTMENT_OTHER): Payer: BC Managed Care – PPO | Admitting: Hospice and Palliative Medicine

## 2021-11-22 ENCOUNTER — Inpatient Hospital Stay: Payer: BC Managed Care – PPO

## 2021-11-22 ENCOUNTER — Other Ambulatory Visit: Payer: Self-pay | Admitting: *Deleted

## 2021-11-22 ENCOUNTER — Encounter: Payer: Self-pay | Admitting: Internal Medicine

## 2021-11-22 ENCOUNTER — Encounter: Payer: Self-pay | Admitting: Licensed Clinical Social Worker

## 2021-11-22 ENCOUNTER — Inpatient Hospital Stay: Payer: BC Managed Care – PPO | Admitting: Internal Medicine

## 2021-11-22 ENCOUNTER — Encounter: Payer: Self-pay | Admitting: *Deleted

## 2021-11-22 ENCOUNTER — Other Ambulatory Visit: Payer: Self-pay

## 2021-11-22 ENCOUNTER — Ambulatory Visit: Payer: BC Managed Care – PPO | Admitting: Radiation Oncology

## 2021-11-22 DIAGNOSIS — Z5112 Encounter for antineoplastic immunotherapy: Secondary | ICD-10-CM | POA: Diagnosis not present

## 2021-11-22 DIAGNOSIS — C3411 Malignant neoplasm of upper lobe, right bronchus or lung: Secondary | ICD-10-CM | POA: Diagnosis not present

## 2021-11-22 DIAGNOSIS — C3490 Malignant neoplasm of unspecified part of unspecified bronchus or lung: Secondary | ICD-10-CM

## 2021-11-22 DIAGNOSIS — Z515 Encounter for palliative care: Secondary | ICD-10-CM

## 2021-11-22 DIAGNOSIS — Z5111 Encounter for antineoplastic chemotherapy: Secondary | ICD-10-CM | POA: Diagnosis not present

## 2021-11-22 DIAGNOSIS — F419 Anxiety disorder, unspecified: Secondary | ICD-10-CM | POA: Diagnosis not present

## 2021-11-22 LAB — CBC WITH DIFFERENTIAL/PLATELET
Abs Immature Granulocytes: 0.02 10*3/uL (ref 0.00–0.07)
Basophils Absolute: 0 10*3/uL (ref 0.0–0.1)
Basophils Relative: 0 %
Eosinophils Absolute: 0 10*3/uL (ref 0.0–0.5)
Eosinophils Relative: 1 %
HCT: 45.7 % (ref 39.0–52.0)
Hemoglobin: 16 g/dL (ref 13.0–17.0)
Immature Granulocytes: 0 %
Lymphocytes Relative: 20 %
Lymphs Abs: 0.9 10*3/uL (ref 0.7–4.0)
MCH: 30.6 pg (ref 26.0–34.0)
MCHC: 35 g/dL (ref 30.0–36.0)
MCV: 87.4 fL (ref 80.0–100.0)
Monocytes Absolute: 0.8 10*3/uL (ref 0.1–1.0)
Monocytes Relative: 17 %
Neutro Abs: 2.8 10*3/uL (ref 1.7–7.7)
Neutrophils Relative %: 62 %
Platelets: 210 10*3/uL (ref 150–400)
RBC: 5.23 MIL/uL (ref 4.22–5.81)
RDW: 14 % (ref 11.5–15.5)
WBC: 4.6 10*3/uL (ref 4.0–10.5)
nRBC: 0 % (ref 0.0–0.2)

## 2021-11-22 LAB — COMPREHENSIVE METABOLIC PANEL
ALT: 28 U/L (ref 0–44)
AST: 21 U/L (ref 15–41)
Albumin: 4.4 g/dL (ref 3.5–5.0)
Alkaline Phosphatase: 74 U/L (ref 38–126)
Anion gap: 9 (ref 5–15)
BUN: 18 mg/dL (ref 6–20)
CO2: 22 mmol/L (ref 22–32)
Calcium: 9.1 mg/dL (ref 8.9–10.3)
Chloride: 107 mmol/L (ref 98–111)
Creatinine, Ser: 1.04 mg/dL (ref 0.61–1.24)
GFR, Estimated: >60 mL/min (ref >60)
Glucose, Bld: 111 mg/dL — ABNORMAL HIGH (ref 70–99)
Potassium: 3.8 mmol/L (ref 3.5–5.1)
Sodium: 138 mmol/L (ref 135–145)
Total Bilirubin: 0.7 mg/dL (ref 0.3–1.2)
Total Protein: 7.7 g/dL (ref 6.5–8.1)

## 2021-11-22 MED ORDER — SODIUM CHLORIDE 0.9 % IV SOLN
712.5000 mg | Freq: Once | INTRAVENOUS | Status: AC
  Administered 2021-11-22: 12:00:00 710 mg via INTRAVENOUS
  Filled 2021-11-22: qty 71

## 2021-11-22 MED ORDER — SODIUM CHLORIDE 0.9 % IV SOLN
10.0000 mg | Freq: Once | INTRAVENOUS | Status: AC
  Administered 2021-11-22: 10:00:00 10 mg via INTRAVENOUS
  Filled 2021-11-22: qty 10

## 2021-11-22 MED ORDER — SODIUM CHLORIDE 0.9 % IV SOLN
350.0000 mg | Freq: Once | INTRAVENOUS | Status: AC
  Administered 2021-11-22: 350 mg via INTRAVENOUS
  Filled 2021-11-22: qty 7

## 2021-11-22 MED ORDER — SODIUM CHLORIDE 0.9 % IV SOLN
500.0000 mg/m2 | Freq: Once | INTRAVENOUS | Status: AC
  Administered 2021-11-22: 11:00:00 1200 mg via INTRAVENOUS
  Filled 2021-11-22: qty 40

## 2021-11-22 MED ORDER — SODIUM CHLORIDE 0.9 % IV SOLN
150.0000 mg | Freq: Once | INTRAVENOUS | Status: AC
  Administered 2021-11-22: 10:00:00 150 mg via INTRAVENOUS
  Filled 2021-11-22: qty 150

## 2021-11-22 MED ORDER — SODIUM CHLORIDE 0.9 % IV SOLN
Freq: Once | INTRAVENOUS | Status: AC
  Filled 2021-11-22: qty 250

## 2021-11-22 MED ORDER — PALONOSETRON HCL INJECTION 0.25 MG/5ML
0.2500 mg | Freq: Once | INTRAVENOUS | Status: AC
  Administered 2021-11-22: 09:00:00 0.25 mg via INTRAVENOUS
  Filled 2021-11-22: qty 5

## 2021-11-22 NOTE — Progress Notes (Signed)
[place] CONSULT NOTE  Patient Care Team: Pcp, No as PCP - General Lorretta Harp, MD as PCP - Cardiology (Cardiology) Curt Bears, MD as Consulting Physician (Oncology) Casilda Carls, MD as Referring Physician (Internal Medicine) Cammie Sickle, MD as Consulting Physician (Internal Medicine) Cammie Sickle, MD as Consulting Physician (Internal Medicine)  CHIEF COMPLAINTS/PURPOSE OF CONSULTATION: lung cancer  #  Oncology History Overview Note  DIAGNOSIS: 1) stage IV (T1c, N0, M1 C) non-small cell lung cancer favoring adenocarcinoma presented with right lung apical nodule in addition to metastatic disease in the right hepatic lobe and the proximal right femoral diaphysis diagnosed and December 2022. 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 LUNG, RUL, FINE NEEDLE ASPIRATION:  - Malignant cells consistent with non-small cell carcinoma, see comment   B. LUNG, RUL, BRUSHING:  - Malignant cells consistent with non-small cell carcinoma, see comment       COMMENT:   A and B.  Dr. Saralyn Pilar reviewed the case and concurs with the diagnosis.  Only rare malignant cells are present on the cellblock.  Immunohistochemical stains were attempted and show that the tumor cells  have patchy staining for TTF-1 whereas p63, p40 and CK5/6 are negative.  The findings are nondiagnostic but suggestive of a lung adenocarcinoma.  Dr. Lamonte Sakai was notified on 10/13/2021.   SEP-OCT 2022-  [Dermatology]-   Poorly differentiated squamous cell carcinoma; -  Carcinoma extends to the edges of the excision specimen   IMPRESSION: 1. The spiculated nodule at the right lung apex on recent neck CT is hypermetabolic and is concerning for primary bronchogenic carcinoma. Tissue sampling  recommended. 2. Hypermetabolic activity within the occipital scalp and small previously demonstrated right occipital lymph node compatible with known squamous cell carcinoma. Evaluation of the head and neck limited by motion artifact. 3. Hypermetabolic lesions inferiorly in the right hepatic lobe and in the proximal right femoral diaphysis suspicious for metastatic disease, primary uncertain in this patient with a history of prostate cancer. Correlate with PSA levels. Abdominal MRI without and with contrast may be helpful for further characterization of the liver lesion.  # 2007MI-CAD [s/p stent-Dr.Berry; EF 2021- 58%]     Primary adenocarcinoma of upper lobe of right lung ([place])  10/18/2021 Initial Diagnosis   Primary adenocarcinoma of upper lobe of right lung ([place])   10/18/2021 Cancer Staging   Staging form: Lung, AJCC 8th Edition - Clinical: Stage IVB (cT1c, cN0, cM1c) - Signed by Curt Bears, MD on 10/18/2021    11/01/2021 -  Chemotherapy   Patient is on Treatment Plan : LUNG NSCLC Pemetrexed + Carboplatin q21d x 4 Cycles        HISTORY OF PRESENTING ILLNESS: Ambulating independently.  Accompanied by his wife. [name] 61 y.o.  male metastatic stage IV non-small cell lung cancer/favor adenocarcinoma-currently on chemoimmunotherapy here for follow-up.  Negative patient was evaluated by palliative care-for anxiety.  Patient states his anxiety is better.  He continues to work full-time.  Denies any worsening nausea vomiting or constipation or diarrhea.  No skin rash.  Review of Systems  Constitutional:  Positive for malaise/fatigue. Negative for chills, diaphoresis, fever and weight loss.  HENT:  Positive for hearing loss. Negative for nosebleeds and sore throat.   Eyes:  Negative for double vision.  Respiratory:  Negative for cough, hemoptysis, sputum  production, shortness of breath and wheezing.   Cardiovascular:  Negative for chest pain, palpitations,  orthopnea and leg swelling.  Gastrointestinal:  Negative for abdominal pain, blood in stool, diarrhea, heartburn, melena, nausea and vomiting.  Genitourinary:  Negative for dysuria, frequency and urgency.  Musculoskeletal:  Negative for back pain.  Skin: Negative.  Negative for itching and rash.  Neurological:  Negative for dizziness, tingling, focal weakness, weakness and headaches.  Endo/Heme/Allergies:  Does not bruise/bleed easily.  Psychiatric/Behavioral:  Negative for depression. The patient does not have insomnia.     MEDICAL HISTORY:  Past Medical History:  Diagnosis Date   Anxiety    associated with medical care,  worsened by difficulty hearing, does better with wife present   Complication of anesthesia    wife states very anxious may need pre sedation   Coronary artery disease    GERD (gastroesophageal reflux disease)    Hearing loss    mild per wife   Hearing loss    no hearing aids per wife   Hyperlipidemia    Hypertension    MI (myocardial infarction) ([place]) 04/17/2006   Acute inferolateral wall MI with cardiogenic shock and complete heart block   Primary adenocarcinoma of upper lobe of right lung ([place])    Prostate cancer ([place])    Tobacco abuse    Wears glasses    Wears partial dentures    Upper    SURGICAL HISTORY: Past Surgical History:  Procedure Laterality Date   BRONCHIAL BIOPSY  10/09/2021   Procedure: BRONCHIAL BIOPSIES;  Surgeon: Collene Gobble, MD;  Location: [facility];  Service: Pulmonary;;   BRONCHIAL BRUSHINGS  10/09/2021   Procedure: BRONCHIAL BRUSHINGS;  Surgeon: Collene Gobble, MD;  Location: [facility] ENDOSCOPY;  Service: Pulmonary;;   BRONCHIAL NEEDLE ASPIRATION BIOPSY  10/09/2021   Procedure: BRONCHIAL NEEDLE ASPIRATION BIOPSIES;  Surgeon: Collene Gobble, MD;  Location: [facility] ENDOSCOPY;  Service: Pulmonary;;   CARDIAC CATHETERIZATION  2007   left, RCA 100% occluded ruptured plaque with thrombus in the proximal segment   CYST EXCISION N/A  08/30/2021   Procedure: EXCISION OF POSTERIOR SCALP CYST;  Surgeon: Clovis Riley, MD;  Location: [facility];  Service: General;  Laterality: N/A;   CYSTOSCOPY N/A 07/17/2018   Procedure: Consuela Mimes;  Surgeon: Franchot Gallo, MD;  Location: [facility];  Service: Urology;  Laterality: N/A;  no seeds in bladder per Dr Diona Fanti   FIDUCIAL MARKER PLACEMENT  10/09/2021   Procedure: FIDUCIAL MARKER PLACEMENT;  Surgeon: Collene Gobble, MD;  Location: [facility] ENDOSCOPY;  Service: Pulmonary;;   HAND SURGERY Right    has metal plate in arm   PROSTATE BIOPSY     RADIOACTIVE SEED IMPLANT N/A 07/17/2018   Procedure: RADIOACTIVE SEED IMPLANT/BRACHYTHERAPY IMPLANT;  Surgeon: Franchot Gallo, MD;  Location[facility];  Service: Urology;  Laterality: N/A;  77 seeds   SPACE OAR INSTILLATION N/A 07/17/2018   Procedure: SPACE OAR INSTILLATION;  Surgeon: Franchot Gallo, MD;  Location: [facility];  Service: Urology;  Laterality: N/A;   VIDEO BRONCHOSCOPY WITH RADIAL ENDOBRONCHIAL ULTRASOUND  10/09/2021   Procedure: VIDEO BRONCHOSCOPY WITH RADIAL ENDOBRONCHIAL ULTRASOUND;  Surgeon: Collene Gobble, MD;  Location: [facility] ENDOSCOPY;  Service: Pulmonary;;   WRIST SURGERY  10/04/2012    SOCIAL HISTORY: Social History   Socioeconomic History   Marital status: Married    Spouse name: Not on file   Number of children: Not on file   Years of education: Not on  file   Highest education level: Not on file  Occupational History   Not on file  Tobacco Use   Smoking status: Every Day    Packs/day: 1.50    Years: 42.00    Pack years: 63.00    Types: Cigarettes   Smokeless tobacco: Never  Vaping Use   Vaping Use: Never used  Substance and Sexual Activity   Alcohol use: No   Drug use: No   Sexual activity: Yes    Birth control/protection: None  Other Topics Concern   Not on file  Social History Narrative   10-04 19 Unable to ask abuse questions wife with him  today.   Social Determinants of Health   Financial Resource Strain: Not on file  Food Insecurity: Not on file  Transportation Needs: Not on file  Physical Activity: Not on file  Stress: Not on file  Social Connections: Not on file  Intimate Partner Violence: Not on file    FAMILY HISTORY: Family History  Problem Relation Age of Onset   Breast cancer Mother    Prostate cancer Neg Hx    Kidney cancer Neg Hx    Cancer Neg Hx     ALLERGIES:  has No Known Allergies.  MEDICATIONS:  Current Outpatient Medications  Medication Sig Dispense Refill   ALPRAZolam (XANAX) 0.25 MG tablet Take 1 tablet (0.25 mg total) by mouth at bedtime. 15 tablet 0   atorvastatin (LIPITOR) 20 MG tablet TAKE 1 TABLET BY MOUTH EVERY DAY 90 tablet 3   clopidogrel (PLAVIX) 75 MG tablet Take 1 tablet (75 mg total) by mouth daily. TAKE 1 TABLET (75 MG TOTAL) BY MOUTH DAILY. Okay to restart this medication on 10/10/2021. 90 tablet 3   folic acid (FOLVITE) 1 MG tablet Take 1 tablet (1 mg total) by mouth daily. 30 tablet 4   ketoconazole (NIZORAL) 2 % cream Apply 1 application topically 2 (two) times daily as needed for irritation.     metoprolol tartrate (LOPRESSOR) 25 MG tablet Take 1 tablet (25 mg total) by mouth daily. 30 tablet 11   omeprazole (PRILOSEC OTC) 20 MG tablet Take 20 mg by mouth daily.     prochlorperazine (COMPAZINE) 10 MG tablet Take 1 tablet (10 mg total) by mouth every 6 (six) hours as needed for nausea or vomiting. 30 tablet 0   sertraline (ZOLOFT) 50 MG tablet Take 1 tablet (50 mg total) by mouth daily. 30 tablet 0   tamsulosin (FLOMAX) 0.4 MG CAPS capsule Take 1 capsule (0.4 mg total) by mouth daily. 30 capsule 3   No current facility-administered medications for this visit.   Facility-Administered Medications Ordered in Other Visits  Medication Dose Route Frequency Provider Last Rate Last Admin   CARBOplatin (PARAPLATIN) 710 mg in sodium chloride 0.9 % 250 mL chemo infusion  710 mg  Intravenous Once Charlaine Dalton R, MD          .  PHYSICAL EXAMINATION: ECOG PERFORMANCE STATUS: 1 - Symptomatic but completely ambulatory  Vitals:   11/22/21 0833  BP: (!) 147/84  Pulse: (!) 58  Temp: 97.9 F (36.6 C)  SpO2: 96%   Filed Weights   11/22/21 0833  Weight: 240 lb 3.2 oz (109 kg)    Physical Exam Vitals and nursing note reviewed.  HENT:     Head: Normocephalic and atraumatic.     Mouth/Throat:     Pharynx: Oropharynx is clear.  Eyes:     Extraocular Movements: Extraocular movements intact.  Pupils: Pupils are equal, round, and reactive to light.  Cardiovascular:     Rate and Rhythm: Normal rate and regular rhythm.  Pulmonary:     Comments: Decreased breath sounds bilaterally.  Abdominal:     Palpations: Abdomen is soft.  Musculoskeletal:        General: Normal range of motion.     Cervical back: Normal range of motion.  Skin:    General: Skin is warm.  Neurological:     General: No focal deficit present.     Mental Status: He is alert and oriented to person, place, and time.  Psychiatric:        Behavior: Behavior normal.        Judgment: Judgment normal.     LABORATORY DATA:  I have reviewed the data as listed Lab Results  Component Value Date   WBC 4.6 11/22/2021   HGB 16.0 11/22/2021   HCT 45.7 11/22/2021   MCV 87.4 11/22/2021   PLT 210 11/22/2021   Recent Labs    10/31/21 1107 11/06/21 1258 11/13/21 1001 11/22/21 0804  NA 137 136 134* 138  K 3.8 3.9 3.9 3.8  CL 106 103 103 107  CO2 22 25 22 22   GLUCOSE 113* 126* 114* 111*  BUN 17 17 19 18   CREATININE 1.08 0.98 0.84 1.04  CALCIUM 9.4 8.7* 9.1 9.1  GFRNONAA >60 >60 >60 >60  PROT 7.5 7.3  --  7.7  ALBUMIN 4.4 4.1  --  4.4  AST 15 20  --  21  ALT 17 25  --  28  ALKPHOS 81 76  --  74  BILITOT 0.7 0.7  --  0.7    RADIOGRAPHIC STUDIES: I have personally reviewed the radiological images as listed and agreed with the findings in the report. No results  found.  ASSESSMENT & PLAN:   Primary adenocarcinoma of upper lobe of right lung ([place]) # Non-small cell lung cancer/stage IV-favor adeno carcinoma-metastases to right femur/liver; synchronous squamous cell carcinoma scalp. Currentlycarbo-Alimta-Libtayo [cycle #1];   # Proceed with cycle #2; Labs today reviewed;  acceptable for treatment today. Will order scan today/prior to cycle #3..   # SBRT of the right upper lobe lung lesion- s/p SBRT; Right proximal femoral lesion-symptomatic s/p radiation- STABLE; await above CT scan imaging..  # Squamous cell carcinoma of the scalp-s/p excision; positive margins- on libtayo-no clinical evidence of progression.  Monitor for now.   # Anxiety-[Josh] on Zoloft/ zanax prn-STABLE>  # IV access: Wants to hold off the port placement at this time.  # DISPOSITION: # chemo today # 1 week-lab-cbc/bmp # follow up in 3 weeks- MD;labs- cbc/cmp- carboAlimta-Libtayo; CT chest prior-- - - Dr.B      All questions were answered. The patient knows to call the clinic with any problems, questions or concerns.       Cammie Sickle, MD 11/22/2021 11:38 AM

## 2021-11-22 NOTE — Progress Notes (Signed)
[place] at [facility] Telephone:([phone]) [phone] Fax:(336) [phone]   Name: Chris [name] Date: 11/22/2021 MRN: [id number]  DOB: Sep 11, 1961  Patient Care Team: Pcp, No as PCP - General Lorretta Harp, MD as PCP - Cardiology (Cardiology) Curt Bears, MD as Consulting Physician (Oncology) Casilda Carls, MD as Referring Physician (Internal Medicine) Cammie Sickle, MD as Consulting Physician (Internal Medicine) Cammie Sickle, MD as Consulting Physician (Internal Medicine)    REASON FOR CONSULTATION: Chris [name] is a 61 y.o. male with multiple medical problems including stage IV non-small cell lung cancer with metastasis to femur/liver diagnosed in December 2022.  Patient is currently on treatment with carbo/Alimta/Libtayo but has had poor tolerance to treatment.  He is referred to radiation oncology for SBRT of right upper lobe lung lesion and right proximal femoral lesion.  Patient has had severe anxiety and was referred to palliative care to help address goals and manage ongoing symptoms.  SOCIAL HISTORY:     reports that he has been smoking cigarettes. He has a 63.00 pack-year smoking history. He has never used smokeless tobacco. He reports that he does not drink alcohol and does not use drugs.  Patient is married and lives at home with his wife.  They have a son and recently had grandchildren.  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  ADVANCE DIRECTIVES:  Not on file  CODE STATUS:   PAST MEDICAL HISTORY: Past Medical History:  Diagnosis Date   Anxiety    associated with medical care,  worsened by difficulty hearing, does better with wife present   Complication of anesthesia    wife states very anxious may need pre sedation   Coronary artery disease    GERD (gastroesophageal reflux disease)    Hearing loss    mild per wife   Hearing loss    no hearing aids per wife   Hyperlipidemia    Hypertension     MI (myocardial infarction) ([place]) 04/17/2006   Acute inferolateral wall MI with cardiogenic shock and complete heart block   Primary adenocarcinoma of upper lobe of right lung ([place])    Prostate cancer ([place])    Tobacco abuse    Wears glasses    Wears partial dentures    Upper    PAST SURGICAL HISTORY:  Past Surgical History:  Procedure Laterality Date   BRONCHIAL BIOPSY  10/09/2021   Procedure: BRONCHIAL BIOPSIES;  Surgeon: Collene Gobble, MD;  Location: [facility] ENDOSCOPY;  Service: Pulmonary;;   BRONCHIAL BRUSHINGS  10/09/2021   Procedure: BRONCHIAL BRUSHINGS;  Surgeon: Collene Gobble, MD;  Location: [facility] ENDOSCOPY;  Service: Pulmonary;;   BRONCHIAL NEEDLE ASPIRATION BIOPSY  10/09/2021   Procedure: BRONCHIAL NEEDLE ASPIRATION BIOPSIES;  Surgeon: Collene Gobble, MD;  Location: [place] ENDOSCOPY;  Service: Pulmonary;;   CARDIAC CATHETERIZATION  2007   left, RCA 100% occluded ruptured plaque with thrombus in the proximal segment   CYST EXCISION N/A 08/30/2021   Procedure: EXCISION OF POSTERIOR SCALP CYST;  Surgeon: Clovis Riley, MD;  Location: [facility];  Service: General;  Laterality: N/A;   CYSTOSCOPY N/A 07/17/2018   Procedure: Consuela Mimes;  Surgeon: Franchot Gallo, MD;  Location[facility];  Service: Urology;  Laterality: N/A;  no seeds in bladder per Dr Diona Fanti   FIDUCIAL MARKER PLACEMENT  10/09/2021   Procedure: FIDUCIAL MARKER PLACEMENT;  Surgeon: Collene Gobble, MD;  Location: [facility] ENDOSCOPY;  Service: Pulmonary;;   HAND SURGERY Right  has metal plate in arm   PROSTATE BIOPSY     RADIOACTIVE SEED IMPLANT N/A 07/17/2018   Procedure: RADIOACTIVE SEED IMPLANT/BRACHYTHERAPY IMPLANT;  Surgeon: Franchot Gallo, MD;  Location: [facility];  Service: Urology;  Laterality: N/A;  77 seeds   SPACE OAR INSTILLATION N/A 07/17/2018   Procedure: SPACE OAR INSTILLATION;  Surgeon: Franchot Gallo, MD;  Location: [facility];  Service:  Urology;  Laterality: N/A;   VIDEO BRONCHOSCOPY WITH RADIAL ENDOBRONCHIAL ULTRASOUND  10/09/2021   Procedure: VIDEO BRONCHOSCOPY WITH RADIAL ENDOBRONCHIAL ULTRASOUND;  Surgeon: Collene Gobble, MD;  Location: [place] ENDOSCOPY;  Service: Pulmonary;;   WRIST SURGERY  10/04/2012    HEMATOLOGY/ONCOLOGY HISTORY:  Oncology History Overview Note  DIAGNOSIS: 1) stage IV (T1c, N0, M1 C) non-small cell lung cancer favoring adenocarcinoma presented with right lung apical nodule in addition to metastatic disease in the right hepatic lobe and the proximal right femoral diaphysis diagnosed and December 2022. 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 LUNG, RUL, FINE NEEDLE ASPIRATION:  - Malignant cells consistent with non-small cell carcinoma, see comment   B. LUNG, RUL, BRUSHING:  - Malignant cells consistent with non-small cell carcinoma, see comment       COMMENT:   A and B.  Dr. Saralyn Pilar reviewed the case and concurs with the diagnosis.  Only rare malignant cells are present on the cellblock.  Immunohistochemical stains were attempted and show that the tumor cells  have patchy staining for TTF-1 whereas p63, p40 and CK5/6 are negative.  The findings are nondiagnostic but suggestive of a lung adenocarcinoma.  Dr. Lamonte Sakai was notified on 10/13/2021.   SEP-OCT 2022-  [Dermatology]-   Poorly differentiated squamous cell carcinoma; -  Carcinoma extends to the edges of the excision specimen   IMPRESSION: 1. The spiculated nodule at the right lung apex on recent neck CT is hypermetabolic and is concerning for primary bronchogenic carcinoma. Tissue sampling recommended. 2. Hypermetabolic activity within the occipital scalp and small previously demonstrated right occipital lymph node compatible with known squamous cell  carcinoma. Evaluation of the head and neck limited by motion artifact. 3. Hypermetabolic lesions inferiorly in the right hepatic lobe and in the proximal right femoral diaphysis suspicious for metastatic disease, primary uncertain in this patient with a history of prostate cancer. Correlate with PSA levels. Abdominal MRI without and with contrast may be helpful for further characterization of the liver lesion.  # 2007MI-CAD [s/p stent-Dr.Berry; EF 2021- 58%]     Primary adenocarcinoma of upper lobe of right lung ([place])  10/18/2021 Initial Diagnosis   Primary adenocarcinoma of upper lobe of right lung ([place])   10/18/2021 Cancer Staging   Staging form: Lung, AJCC 8th Edition - Clinical: Stage IVB (cT1c, cN0, cM1c) - Signed by Curt Bears, MD on 10/18/2021   11/01/2021 -  Chemotherapy   Patient is on Treatment Plan : LUNG NSCLC Pemetrexed + Carboplatin q21d x 4 Cycles       ALLERGIES:  has No Known Allergies.  MEDICATIONS:  Current Outpatient Medications  Medication Sig Dispense Refill   ALPRAZolam (XANAX) 0.25 MG tablet Take 1 tablet (0.25 mg total) by mouth at bedtime. 15 tablet 0   atorvastatin (LIPITOR) 20 MG tablet TAKE 1 TABLET BY MOUTH EVERY DAY 90 tablet 3   clopidogrel (PLAVIX) 75 MG tablet Take 1 tablet (  75 mg total) by mouth daily. TAKE 1 TABLET (75 MG TOTAL) BY MOUTH DAILY. Okay to restart this medication on 10/10/2021. 90 tablet 3   folic acid (FOLVITE) 1 MG tablet Take 1 tablet (1 mg total) by mouth daily. 30 tablet 4   ketoconazole (NIZORAL) 2 % cream Apply 1 application topically 2 (two) times daily as needed for irritation.     metoprolol tartrate (LOPRESSOR) 25 MG tablet Take 1 tablet (25 mg total) by mouth daily. 30 tablet 11   omeprazole (PRILOSEC OTC) 20 MG tablet Take 20 mg by mouth daily.     prochlorperazine (COMPAZINE) 10 MG tablet Take 1 tablet (10 mg total) by mouth every 6 (six) hours as needed for nausea or vomiting. 30 tablet 0   sertraline (ZOLOFT)  50 MG tablet Take 1 tablet (50 mg total) by mouth daily. 30 tablet 0   tamsulosin (FLOMAX) 0.4 MG CAPS capsule Take 1 capsule (0.4 mg total) by mouth daily. 30 capsule 3   No current facility-administered medications for this visit.    VITAL SIGNS: There were no vitals taken for this visit. There were no vitals filed for this visit.  Estimated body mass index is 33.5 kg/m as calculated from the following:   Height as of an earlier encounter on 11/22/21: _0  (1.803 m).   Weight as of an earlier encounter on 11/22/21: 240 lb 3.2 oz (109 kg).  LABS: CBC:    Component Value Date/Time   WBC 4.6 11/22/2021 0804   HGB 16.0 11/22/2021 0804   HGB 16.4 10/31/2021 1107   HCT 45.7 11/22/2021 0804   PLT 210 11/22/2021 0804   PLT 217 10/31/2021 1107   MCV 87.4 11/22/2021 0804   NEUTROABS 2.8 11/22/2021 0804   LYMPHSABS 0.9 11/22/2021 0804   MONOABS 0.8 11/22/2021 0804   EOSABS 0.0 11/22/2021 0804   BASOSABS 0.0 11/22/2021 0804   Comprehensive Metabolic Panel:    Component Value Date/Time   NA 138 11/22/2021 0804   K 3.8 11/22/2021 0804   CL 107 11/22/2021 0804   CO2 22 11/22/2021 0804   BUN 18 11/22/2021 0804   CREATININE 1.04 11/22/2021 0804   CREATININE 1.08 10/31/2021 1107   GLUCOSE 111 (H) 11/22/2021 0804   CALCIUM 9.1 11/22/2021 0804   AST 21 11/22/2021 0804   AST 15 10/31/2021 1107   ALT 28 11/22/2021 0804   ALT 17 10/31/2021 1107   ALKPHOS 74 11/22/2021 0804   BILITOT 0.7 11/22/2021 0804   BILITOT 0.7 10/31/2021 1107   PROT 7.7 11/22/2021 0804   ALBUMIN 4.4 11/22/2021 0804    RADIOGRAPHIC STUDIES: No results found.  PERFORMANCE STATUS (ECOG) : 1 - Symptomatic but completely ambulatory  Review of Systems Unless otherwise noted, a complete review of systems is negative.  Physical Exam General: NAD Pulmonary: Unlabored Extremities: no edema, no joint deformities Skin: Scaly, erythematous rash beneath pannus Neurological: Weakness but otherwise  nonfocal  IMPRESSION: Follow-up visit.  Patient seen in infusion.  Patient reports that he is doing well and feels okay today.  He denies any symptomatic complaints at present.  He reports stable depression/anxiety and says he is sleeping okay at night. He feels like he is tolerating the Zoloft without issue.   I met with patient's wife.  ACP and MOST form were reviewed.  We discussed Five Wishes document as she is currently trying to plan and coordinate future care/decision-making.  Wife reports that patient's son is struggling with this cancer diagnosis.  She requested  family meeting in the near future.  We also discussed counseling support and referred patient to [place], LCSW.  PLAN: -Continue current scope of treatment -Referral to LCSW -Continue Zoloft/alprazolam -ACP/MOST form reviewed -RTC 3 weeks   Patient expressed understanding and was in agreement with this plan. He also understands that He can call the clinic at any time with any questions, concerns, or complaints.     Time Total: 60 minutes  Visit consisted of counseling and education dealing with the complex and emotionally intense issues of symptom management and palliative care in the setting of serious and potentially life-threatening illness.Greater than 50%  of this time was spent counseling and coordinating care related to the above assessment and plan.  Signed by: Altha Harm, PhD, NP-C

## 2021-11-22 NOTE — Patient Instructions (Signed)
[facility] AT [place]  Discharge Instructions: Thank you for choosing [facility] to provide your oncology and hematology care.  If you have a lab appointment with the [facility], please go directly to the [facility] and check in at the registration area.  Wear comfortable clothing and clothing appropriate for easy access to any Portacath or PICC line.   We strive to give you quality time with your provider. You may need to reschedule your appointment if you arrive late (15 or more minutes).  Arriving late affects you and other patients whose appointments are after yours.  Also, if you miss three or more appointments without notifying the office, you may be dismissed from the clinic at the providers discretion.      For prescription refill requests, have your pharmacy contact our office and allow 72 hours for refills to be completed.    Today you received the following chemotherapy and/or immunotherapy agents : Libtayo / Alimta/ Carboplatin      To help prevent nausea and vomiting after your treatment, we encourage you to take your nausea medication as directed.  BELOW ARE SYMPTOMS THAT SHOULD BE REPORTED IMMEDIATELY: *FEVER GREATER THAN 100.4 F (38 C) OR HIGHER *CHILLS OR SWEATING *NAUSEA AND VOMITING THAT IS NOT CONTROLLED WITH YOUR NAUSEA MEDICATION *UNUSUAL SHORTNESS OF BREATH *UNUSUAL BRUISING OR BLEEDING *URINARY PROBLEMS (pain or burning when urinating, or frequent urination) *BOWEL PROBLEMS (unusual diarrhea, constipation, pain near the anus) TENDERNESS IN MOUTH AND THROAT WITH OR WITHOUT PRESENCE OF ULCERS (sore throat, sores in mouth, or a toothache) UNUSUAL RASH, SWELLING OR PAIN  UNUSUAL VAGINAL DISCHARGE OR ITCHING   Items with * indicate a potential emergency and should be followed up as soon as possible or go to the Emergency Department if any problems should occur.  Please show the CHEMOTHERAPY ALERT CARD or IMMUNOTHERAPY ALERT  CARD at check-in to the Emergency Department and triage nurse.  Should you have questions after your visit or need to cancel or reschedule your appointment, please contact [facility] [place] AT [place]  [phone] and follow the prompts.  Office hours are 8:00 a.m. to 4:30 p.m. Monday - Friday. Please note that voicemails left after 4:00 p.m. may not be returned until the following business day.  We are closed weekends and major holidays. You have access to a nurse at all times for urgent questions. Please call the main number to the clinic [phone] and follow the prompts.  For any non-urgent questions, you may also contact your provider using MyChart. We now offer e-Visits for anyone 80 and older to request care online for non-urgent symptoms. For details visit [email or web address].   Also download the MyChart app! Go to the app store, search "MyChart", open the app, select [facility], and log in with your MyChart username and password.  Due to Covid, a mask is required upon entering the hospital/clinic. If you do not have a mask, one will be given to you upon arrival. For doctor visits, patients may have 1 support person aged 65 or older with them. For treatment visits, patients cannot have anyone with them due to current Covid guidelines and our immunocompromised population.

## 2021-11-22 NOTE — Progress Notes (Signed)
Pt has a bump that has come up on his left upper arm. Having more fatigue. After first tx, had trouble having with bm.

## 2021-11-22 NOTE — Progress Notes (Signed)
Met with patient and his wife during follow up visit with Dr. Rogue Bussing to receive second cycle of chemo+immunotherapy. All questions answered during visit. Pt informed that he will be given follow up appts prior to leaving today. Met with wife in the lobby and had extended conversation to provide further resources. Pt's wife interested in financial assistance and referral to Education officer, museum. Will place referrals. Contact info given to pt's wife, [name], and instructed to call with any further questions or needs.

## 2021-11-22 NOTE — Assessment & Plan Note (Addendum)
#   Non-small cell lung cancer/stage IV-favor adeno carcinoma-metastases to right femur/liver; synchronous squamous cell carcinoma scalp. Currentlycarbo-Alimta-Libtayo [cycle #1];   # Proceed with cycle #2; Labs today reviewed;  acceptable for treatment today. Will order scan today/prior to cycle #3..   # SBRT of the right upper lobe lung lesion- s/p SBRT; Right proximal femoral lesion-symptomatic s/p radiation- STABLE; await above CT scan imaging..  # Squamous cell carcinoma of the scalp-s/p excision; positive margins- on libtayo-no clinical evidence of progression.  Monitor for now.   # Anxiety-[Josh] on Zoloft/ zanax prn-STABLE>  # IV access: Wants to hold off the port placement at this time.  # DISPOSITION: # chemo today # 1 week-lab-cbc/bmp # follow up in 3 weeks- MD;labs- cbc/cmp- carboAlimta-Libtayo; CT chest prior-- - - Dr.B

## 2021-11-24 ENCOUNTER — Encounter: Payer: Self-pay | Admitting: Internal Medicine

## 2021-11-24 ENCOUNTER — Encounter: Payer: Self-pay | Admitting: *Deleted

## 2021-11-24 ENCOUNTER — Ambulatory Visit: Payer: BC Managed Care – PPO | Admitting: Psychologist

## 2021-11-24 ENCOUNTER — Ambulatory Visit: Payer: BC Managed Care – PPO | Admitting: Radiation Oncology

## 2021-11-24 NOTE — Progress Notes (Signed)
[facility] Clinical Social Work  Initial Assessment   Chris [name] is a 61 y.o. year old male, CSW met with patient's spouse and primary caregiver [name] "Kathlee Nations" Kollman . Clinical Social Work was referred by Sharion Dove NP for assessment of psychosocial needs.   SDOH (Social Determinants of Health) assessments performed: No   Distress Screen completed: No ONCBCN DISTRESS SCREENING 11/06/2021  Screening Type Initial Screening  Distress experienced in past week (1-10) 8  Emotional problem type Adjusting to illness;Nervousness/Anxiety  Physician notified of physical symptoms -  Referral to clinical psychology -  Referral to clinical social work -  Referral to dietition -  Referral to financial advocate -  Referral to support programs -  Referral to palliative care -      Family/Social Information:  Housing Arrangement: patient lives with spouse , sees son regularly Family members/support persons in your life? Family, Church, [employer] , and work Transport planner concerns: no  Employment: Working part time. Income source: Employment Financial concerns: No Type of concern: None Food access concerns: no Religious or spiritual practice: yes Services Currently in place:  Patient has BCBS PPO insurance  Coping/ Adjustment to diagnosis: Patient understands treatment plan and what happens next? yes Concerns about diagnosis and/or treatment: How I will care for other members of my family Patient reported stressors: Work/ school, Partner, Adjusting to my illness, and interpersonal relationships Hopes and priorities: To continue working as long as possible Patient enjoys being outside and time with family/ friends Current coping skills/ strengths: Ability for insight , Average or above average intelligence , Capable of independent living , and Special hobby/interest     SUMMARY: Current SDOH Barriers:  Level of care concerns and inter personal relationships  Clinical Social Work Clinical  Goal(s):  patient will work with SW to address concerns related to interpersonal relationships and illness adjustment  Interventions: Discussed common feeling and emotions when being diagnosed with cancer, and the importance of support during treatment Informed patient of the support team roles and support services at [facility] Provided [place] contact information and encouraged patient to call with any questions or concerns CSW spoke with patient's spouse Kathlee Nations at length.  Ms. Cantave stated the patient's main concern is about his son Mia [place], who is having a difficult time with adjustment to the patient's illness.  Ms. Grasse stated, for the most part, the patient is adapting well to diagnosis and prognosis and has spoken about last wishes and preparations. Ms. Rabalais stated this is the most difficult  part of the process because she feels the patient is giving up hope.  CSW and Ms. Dowland spoke about different ways to support the patient during this process including allowing him to express himself about his experience.  CSW and Ms. Shadwick and CSW spoke about different ways she could manage her anxiety and stress during this process and how to approach the patient with her questions and concerns.  CSW and Ms. Kittleson also spoke about the different resources available to the patient via the cancer center and outside resources, and resources available to the caregivers and family.  CSW also stated if the patient and the care givers wanted to participate in a family meeting with the CSW I would be available to do that. Ms. Levels and CSW agreed to speak again when the patient returned to infusion on 06/05/2022.    Follow Up Plan: CSW will see patient on and Ms. Adamski on 06/05/2022 Patient verbalizes understanding of plan: Yes    Maurice Small  Denine Brotz , LCSW

## 2021-11-27 ENCOUNTER — Telehealth: Payer: Self-pay | Admitting: Radiation Oncology

## 2021-11-27 NOTE — Telephone Encounter (Signed)
I spoke with the patient's spouse to discuss the expectations for and against additional imaging at this interval since her husband's radiation. We discussed the ablative technique of this type of radiation usually causes scar tissue to build in the cavity of prior radiation, so we would find imaging to be less than helpful at counseling him on response. Hopefully he will feel some improvement in pain in the next week or two that could still be from radiotherapy. At the conclusion she is in agreement to proceed without additional imaging, but we will revisit options of radiation to his scalp after completing chemotherapy.

## 2021-11-29 ENCOUNTER — Inpatient Hospital Stay: Payer: BC Managed Care – PPO | Attending: Internal Medicine

## 2021-11-29 ENCOUNTER — Other Ambulatory Visit: Payer: Self-pay

## 2021-11-29 ENCOUNTER — Encounter: Payer: Self-pay | Admitting: *Deleted

## 2021-11-29 ENCOUNTER — Other Ambulatory Visit: Payer: BC Managed Care – PPO

## 2021-11-29 DIAGNOSIS — C787 Secondary malignant neoplasm of liver and intrahepatic bile duct: Secondary | ICD-10-CM | POA: Insufficient documentation

## 2021-11-29 DIAGNOSIS — Z79899 Other long term (current) drug therapy: Secondary | ICD-10-CM | POA: Insufficient documentation

## 2021-11-29 DIAGNOSIS — F419 Anxiety disorder, unspecified: Secondary | ICD-10-CM | POA: Insufficient documentation

## 2021-11-29 DIAGNOSIS — C4442 Squamous cell carcinoma of skin of scalp and neck: Secondary | ICD-10-CM | POA: Insufficient documentation

## 2021-11-29 DIAGNOSIS — C7951 Secondary malignant neoplasm of bone: Secondary | ICD-10-CM | POA: Diagnosis not present

## 2021-11-29 DIAGNOSIS — R590 Localized enlarged lymph nodes: Secondary | ICD-10-CM | POA: Insufficient documentation

## 2021-11-29 DIAGNOSIS — Z5111 Encounter for antineoplastic chemotherapy: Secondary | ICD-10-CM | POA: Diagnosis not present

## 2021-11-29 DIAGNOSIS — C3411 Malignant neoplasm of upper lobe, right bronchus or lung: Secondary | ICD-10-CM | POA: Diagnosis not present

## 2021-11-29 DIAGNOSIS — I2510 Atherosclerotic heart disease of native coronary artery without angina pectoris: Secondary | ICD-10-CM | POA: Insufficient documentation

## 2021-11-29 LAB — BASIC METABOLIC PANEL
Anion gap: 9 (ref 5–15)
BUN: 19 mg/dL (ref 6–20)
CO2: 24 mmol/L (ref 22–32)
Calcium: 9.1 mg/dL (ref 8.9–10.3)
Chloride: 102 mmol/L (ref 98–111)
Creatinine, Ser: 0.92 mg/dL (ref 0.61–1.24)
GFR, Estimated: >60 mL/min (ref >60)
Glucose, Bld: 102 mg/dL — ABNORMAL HIGH (ref 70–99)
Potassium: 4.1 mmol/L (ref 3.5–5.1)
Sodium: 135 mmol/L (ref 135–145)

## 2021-11-29 LAB — CBC WITH DIFFERENTIAL/PLATELET
Abs Immature Granulocytes: 0.01 10*3/uL (ref 0.00–0.07)
Basophils Absolute: 0 10*3/uL (ref 0.0–0.1)
Basophils Relative: 1 %
Eosinophils Absolute: 0 10*3/uL (ref 0.0–0.5)
Eosinophils Relative: 1 %
HCT: 44.7 % (ref 39.0–52.0)
Hemoglobin: 15.5 g/dL (ref 13.0–17.0)
Immature Granulocytes: 0 %
Lymphocytes Relative: 35 %
Lymphs Abs: 1.1 10*3/uL (ref 0.7–4.0)
MCH: 30.2 pg (ref 26.0–34.0)
MCHC: 34.7 g/dL (ref 30.0–36.0)
MCV: 87 fL (ref 80.0–100.0)
Monocytes Absolute: 0.4 10*3/uL (ref 0.1–1.0)
Monocytes Relative: 12 %
Neutro Abs: 1.7 10*3/uL (ref 1.7–7.7)
Neutrophils Relative %: 51 %
Platelets: 122 10*3/uL — ABNORMAL LOW (ref 150–400)
RBC: 5.14 MIL/uL (ref 4.22–5.81)
RDW: 13.7 % (ref 11.5–15.5)
WBC: 3.3 10*3/uL — ABNORMAL LOW (ref 4.0–10.5)
nRBC: 0 % (ref 0.0–0.2)

## 2021-11-29 LAB — GUARDANT 360

## 2021-12-05 ENCOUNTER — Other Ambulatory Visit: Payer: Self-pay | Admitting: *Deleted

## 2021-12-05 MED ORDER — SERTRALINE HCL 50 MG PO TABS: 50.0000 mg | ORAL_TABLET | Freq: Every day | ORAL | 0 refills | Status: DC

## 2021-12-06 ENCOUNTER — Other Ambulatory Visit: Payer: BC Managed Care – PPO

## 2021-12-07 ENCOUNTER — Inpatient Hospital Stay: Payer: BC Managed Care – PPO | Admitting: Hospice and Palliative Medicine

## 2021-12-07 ENCOUNTER — Other Ambulatory Visit: Payer: Self-pay

## 2021-12-07 ENCOUNTER — Encounter: Payer: Self-pay | Admitting: *Deleted

## 2021-12-07 ENCOUNTER — Ambulatory Visit
Admission: RE | Admit: 2021-12-07 | Discharge: 2021-12-07 | Disposition: A | Discharge status: Home / Self Care | Payer: BC Managed Care – PPO | Source: Ambulatory Visit | Attending: Internal Medicine | Admitting: Internal Medicine

## 2021-12-07 DIAGNOSIS — C3411 Malignant neoplasm of upper lobe, right bronchus or lung: Secondary | ICD-10-CM | POA: Diagnosis not present

## 2021-12-07 DIAGNOSIS — R911 Solitary pulmonary nodule: Secondary | ICD-10-CM | POA: Diagnosis not present

## 2021-12-07 DIAGNOSIS — I700 Atherosclerosis of aorta: Secondary | ICD-10-CM | POA: Diagnosis not present

## 2021-12-07 IMAGING — CT CT CHEST W/ CM
2 of 4 series · 12 of 36 positions shown, 15 images · IV contrast (agent unspecified)
Comparison: Chest CT [DATE].
COMPARISON: Chest CT [DATE].

Addendum:
CLINICAL DATA: 60-year-old male with history of non-small cell
carcinoma.

EXAM:
CT CHEST WITH CONTRAST
TECHNIQUE: Multidetector CT imaging of the chest was performed during
intravenous contrast administration.

[Series 2: axial chest 2.00 · axial · 0.71mm/px · z∈[-1243,-935]mm · 9 of 183 slices shown, 12 images]
[im 15/183  mediastinal]
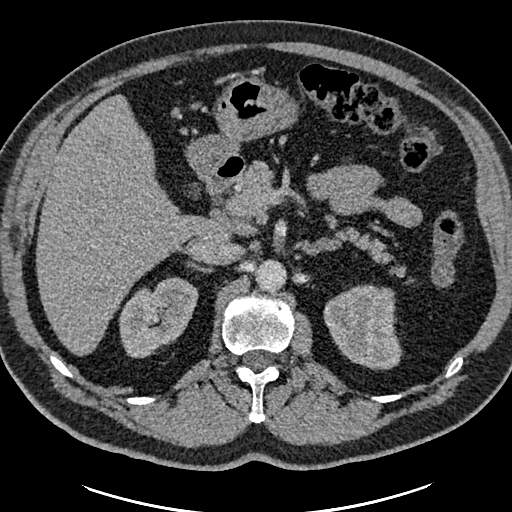
[im 15/183  lung]
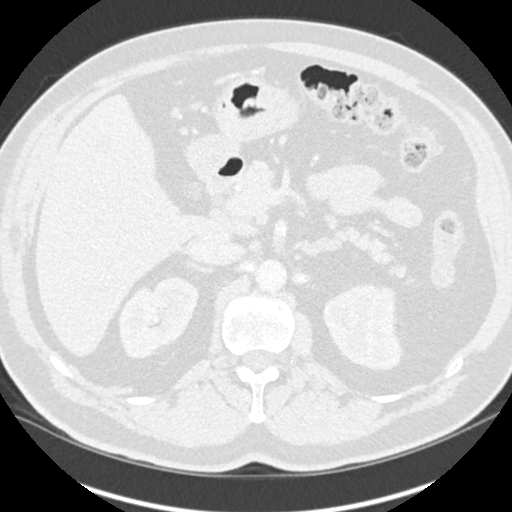
[im 43/183  lung]
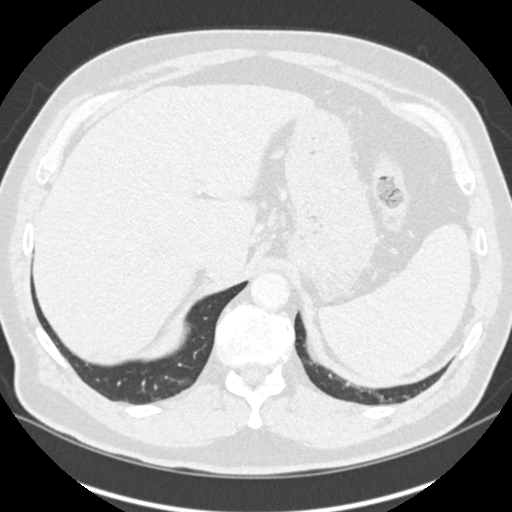
[im 57/183  lung]
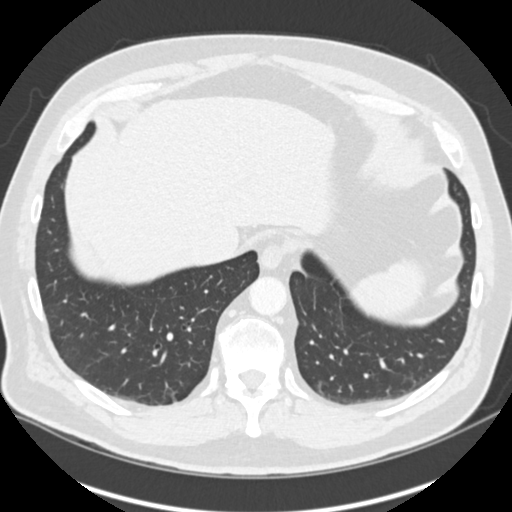
[im 71/183  lung]
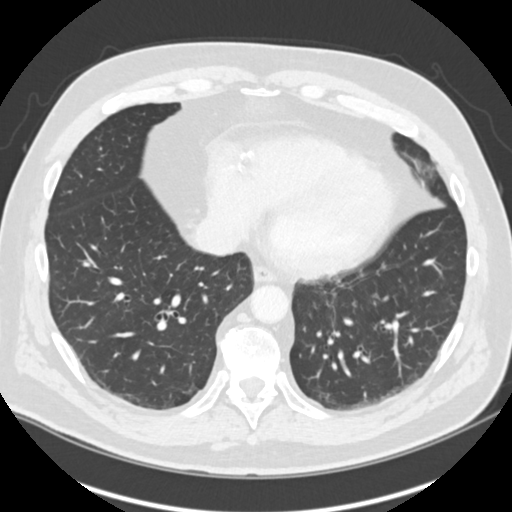
[im 99/183  mediastinal]
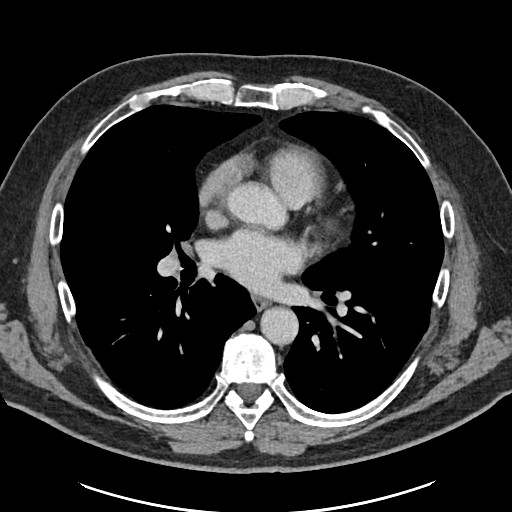
[im 99/183  lung]
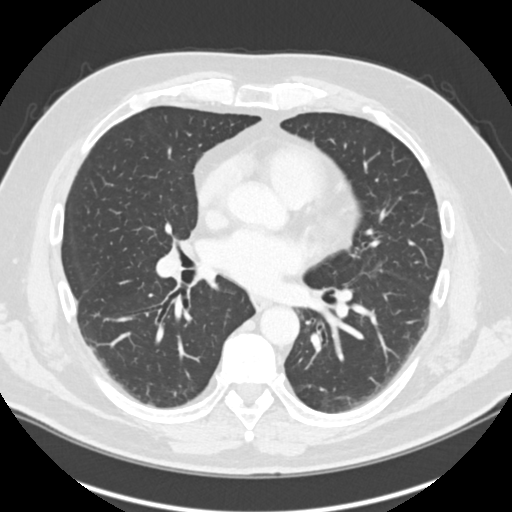
[im 113/183  lung]
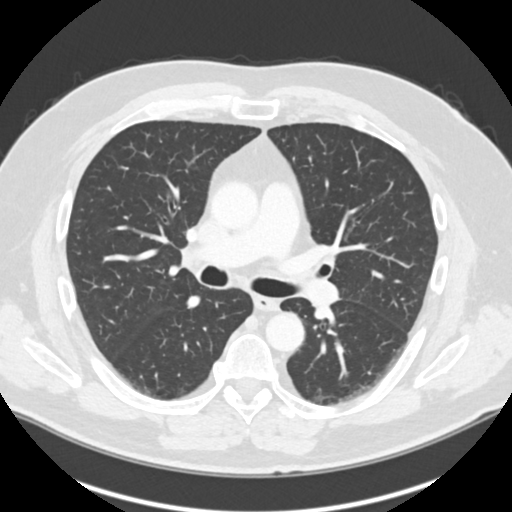
[im 127/183  lung]
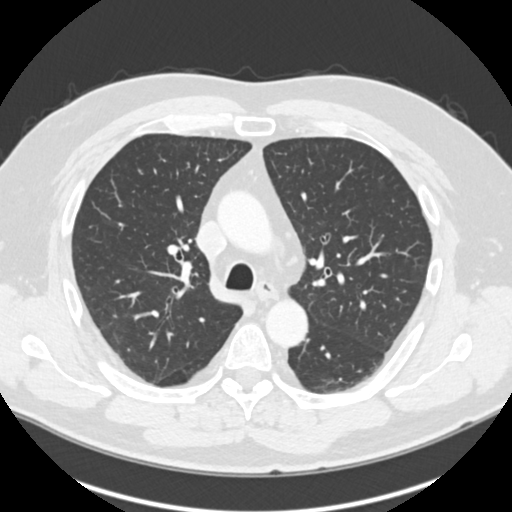
[im 155/183  lung]
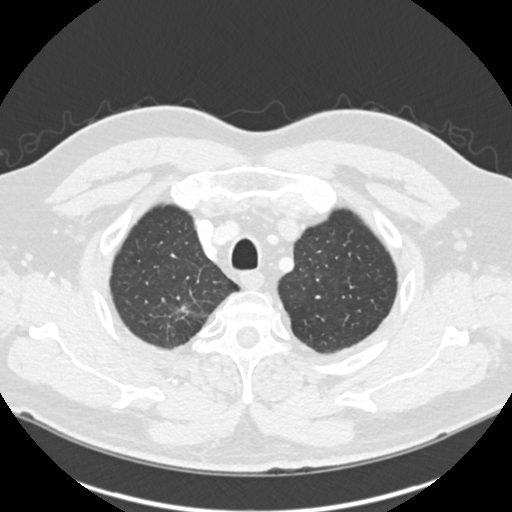
[im 169/183  mediastinal]
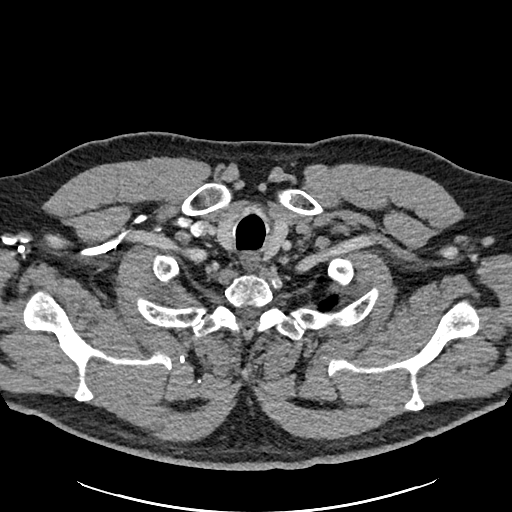
[im 169/183  lung]
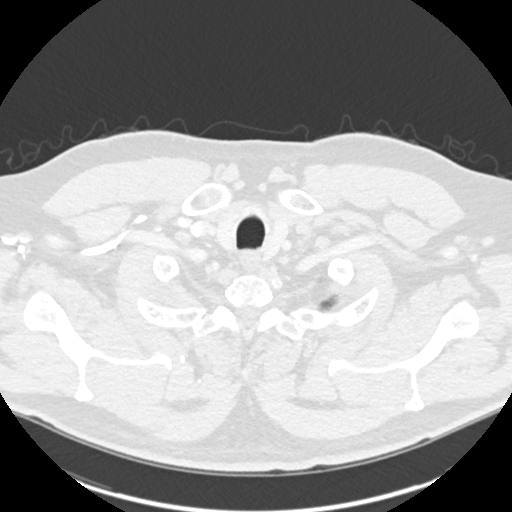

[Series 4: coronal chest 2.00 cor · coronal · 0.71mm/px · 3 of 158 slices shown]
[im 32/158  lung]
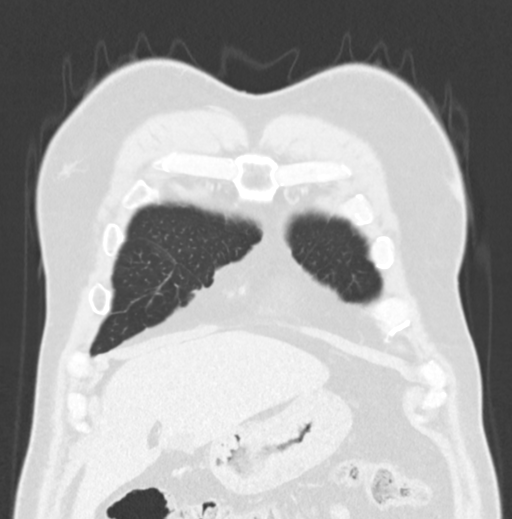
[im 63/158  lung]
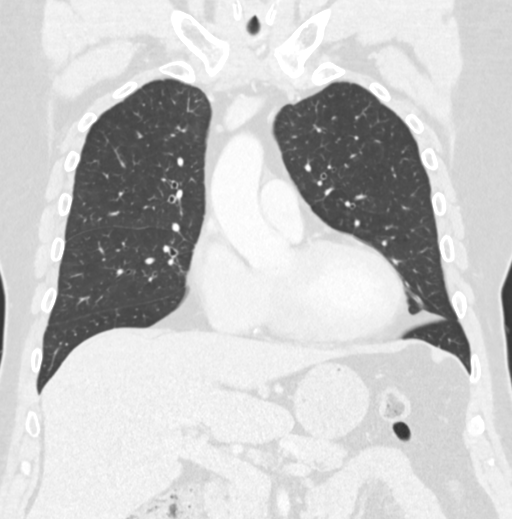
[im 95/158  lung]
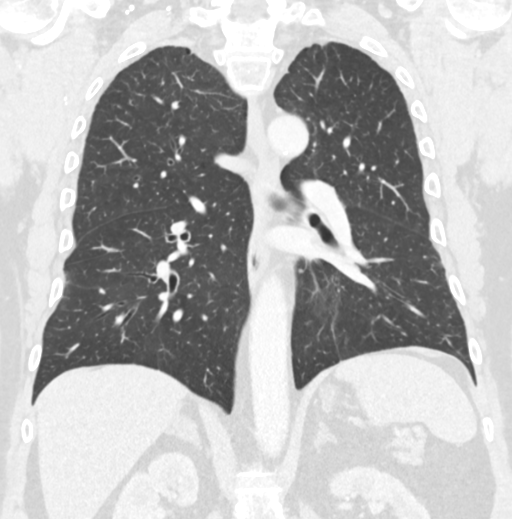

[12 of 36 positions shown; findings below may reference images not displayed]

RADIATION DOSE REDUCTION: This exam was performed according to the
departmental dose-optimization program which includes automated
exposure control, adjustment of the mA and/or kV according to
patient size and/or use of iterative reconstruction technique.

CONTRAST:  75mL OMNIPAQUE IOHEXOL 300 MG/ML  SOLN
FINDINGS: Cardiovascular: Heart size is normal. There is no significant
pericardial fluid, thickening or pericardial calcification. There is
aortic atherosclerosis, as well as atherosclerosis of the great
vessels of the mediastinum and the coronary arteries, including
calcified atherosclerotic plaque in the left anterior descending,
left circumflex and right coronary arteries.

Mediastinum/Nodes: No pathologically enlarged mediastinal or hilar
lymph nodes. Esophagus is unremarkable in appearance. No axillary
lymphadenopathy.

Lungs/Pleura: Previously noted right upper lobe pulmonary nodule is
similar to the prior examination (axial image 25 of series 3)
measuring 2.5 x 1.5 cm (previously 2.3 x 1.5 cm). Inferior to the
lesion there is a fiducial marker. No other suspicious appearing
pulmonary nodules or masses are noted. No acute consolidative
airspace disease. No pleural effusions. Mild chronic post infectious
or inflammatory scarring in the inferior segment of the lingula is
again noted.

Upper Abdomen: Aortic atherosclerosis. Bilateral adrenal nodules,
stable compared to the prior noncontrast CT examination, previously
characterized as benign adenomas, largest of which is on the right
side measuring 2.8 x 1.8 cm.

Musculoskeletal: There are no aggressive appearing lytic or blastic
lesions noted in the visualized portions of the skeleton.
IMPRESSION: 1. Roughly stable size of aggressive appearing right upper lobe
pulmonary nodule which was previously demonstrated to be
hypermetabolic on prior PET-CT [DATE], compatible with reported
clinical history of primary bronchogenic neoplasm (likely
adenocarcinoma).
2. No new pulmonary nodules or lymphadenopathy to suggest metastatic
disease in the chest.
3. Aortic atherosclerosis, in addition to three-vessel coronary
artery disease. Please note that although the presence of coronary
artery calcium documents the presence of coronary artery disease,
the severity of this disease and any potential stenosis cannot be
assessed on this non-gated CT examination. Assessment for potential
risk factor modification, dietary therapy or pharmacologic therapy
may be warranted, if clinically indicated.
4. Bilateral adrenal adenomas again noted.

Aortic Atherosclerosis ([P1]-[P1]).

ADDENDUM:
Upon further review, in segment 5 of the liver (axial image 171 of
series 2) there is a very subtle 1.6 cm intermediate attenuation (36
Hounsfield unit) lesion which is unchanged in retrospect compared to
[DATE], which corresponds with a hypovascular hepatic lesion in
the liver on prior abdominal MRI [DATE], which was
hypermetabolic on PET-CT [DATE], presumably a metastatic lesion.

*** End of Addendum ***
RADIATION DOSE REDUCTION: This exam was performed according to the
departmental dose-optimization program which includes automated
exposure control, adjustment of the mA and/or kV according to
patient size and/or use of iterative reconstruction technique.

CONTRAST:  75mL OMNIPAQUE IOHEXOL 300 MG/ML  SOLN
FINDINGS: Cardiovascular: Heart size is normal. There is no significant
pericardial fluid, thickening or pericardial calcification. There is
aortic atherosclerosis, as well as atherosclerosis of the great
vessels of the mediastinum and the coronary arteries, including
calcified atherosclerotic plaque in the left anterior descending,
left circumflex and right coronary arteries.

Mediastinum/Nodes: No pathologically enlarged mediastinal or hilar
lymph nodes. Esophagus is unremarkable in appearance. No axillary
lymphadenopathy.

Lungs/Pleura: Previously noted right upper lobe pulmonary nodule is
similar to the prior examination (axial image 25 of series 3)
measuring 2.5 x 1.5 cm (previously 2.3 x 1.5 cm). Inferior to the
lesion there is a fiducial marker. No other suspicious appearing
pulmonary nodules or masses are noted. No acute consolidative
airspace disease. No pleural effusions. Mild chronic post infectious
or inflammatory scarring in the inferior segment of the lingula is
again noted.

Upper Abdomen: Aortic atherosclerosis. Bilateral adrenal nodules,
stable compared to the prior noncontrast CT examination, previously
characterized as benign adenomas, largest of which is on the right
side measuring 2.8 x 1.8 cm.

Musculoskeletal: There are no aggressive appearing lytic or blastic
lesions noted in the visualized portions of the skeleton.
IMPRESSION: 1. Roughly stable size of aggressive appearing right upper lobe
pulmonary nodule which was previously demonstrated to be
hypermetabolic on prior PET-CT [DATE], compatible with reported
clinical history of primary bronchogenic neoplasm (likely
adenocarcinoma).
2. No new pulmonary nodules or lymphadenopathy to suggest metastatic
disease in the chest.
3. Aortic atherosclerosis, in addition to three-vessel coronary
artery disease. Please note that although the presence of coronary
artery calcium documents the presence of coronary artery disease,
the severity of this disease and any potential stenosis cannot be
assessed on this non-gated CT examination. Assessment for potential
risk factor modification, dietary therapy or pharmacologic therapy
may be warranted, if clinically indicated.
4. Bilateral adrenal adenomas again noted.

Aortic Atherosclerosis ([P1]-[P1]).

## 2021-12-07 MED ORDER — IOHEXOL 300 MG/ML  SOLN
75.0000 mL | Freq: Once | INTRAMUSCULAR | Status: AC | PRN
  Administered 2021-12-07: 75 mL via INTRAVENOUS

## 2021-12-08 ENCOUNTER — Encounter: Payer: Self-pay | Admitting: Cardiovascular Disease

## 2021-12-08 ENCOUNTER — Encounter: Payer: Self-pay | Admitting: Internal Medicine

## 2021-12-11 ENCOUNTER — Ambulatory Visit
Admission: RE | Admit: 2021-12-11 | Discharge: 2021-12-11 | Disposition: A | Discharge status: Home / Self Care | Payer: BC Managed Care – PPO | Source: Ambulatory Visit | Attending: Radiation Oncology | Admitting: Radiation Oncology

## 2021-12-11 DIAGNOSIS — C3411 Malignant neoplasm of upper lobe, right bronchus or lung: Secondary | ICD-10-CM

## 2021-12-11 DIAGNOSIS — C4442 Squamous cell carcinoma of skin of scalp and neck: Secondary | ICD-10-CM

## 2021-12-11 NOTE — Progress Notes (Signed)
I spoke to patient wife regarding the results of the CT scan. Overall respond to stable post two cycles of chemo immunotherapy. Will discuss further at next visit.

## 2021-12-11 NOTE — Progress Notes (Signed)
°  Radiation Oncology         [phone]) [phone] ________________________________  Name: Chris [name] MRN: [id number]  Date of Service: 12/18/21 DOB: Mar 16, 1961  Post Treatment Telephone Note  Diagnosis:   Stage IV, NSCLC, adenocarcinoma of the RUL with bone metastases. Squamous Cell Carcinoma of the scalp.  Intent: Curative  Radiation Treatment Dates:  11/07/2021 through 11/17/2021 SBRT Treatment Site Technique Total Dose (Gy) Dose per Fx (Gy) Completed Fx Beam Energies  Lung, Right: Lung_R IMRT 60/60 12 5/5 6XFFF  Femur Right: Ext_R IMRT 60/60 12 5/5 6XFFF   Narrative: The patient tolerated radiation therapy relatively well. He did have some tightness in his scalp since this area has not been treated.    Impression/Plan: 1. Stage IV, NSCLC, adenocarcinoma of the RUL with bone metastases. Squamous Cell Carcinoma of the scalp. The patient has been doing well since completion of radiotherapy. We discussed that we would be happy to continue to follow him as needed, but he will also continue to follow up with Dr. Rogue Bussing in medical oncology. We will follow up with him after systemic therapy to review the plans to treat his scalp.     Carola Rhine, PAC

## 2021-12-12 ENCOUNTER — Encounter: Payer: Self-pay | Admitting: Hospice and Palliative Medicine

## 2021-12-12 ENCOUNTER — Other Ambulatory Visit: Payer: Self-pay

## 2021-12-12 MED ORDER — PROCHLORPERAZINE MALEATE 10 MG PO TABS: 10.0000 mg | ORAL_TABLET | Freq: Four times a day (QID) | ORAL | 1 refills | Status: DC | PRN

## 2021-12-13 ENCOUNTER — Inpatient Hospital Stay: Payer: BC Managed Care – PPO

## 2021-12-13 ENCOUNTER — Other Ambulatory Visit: Payer: BC Managed Care – PPO

## 2021-12-13 ENCOUNTER — Other Ambulatory Visit: Payer: Self-pay

## 2021-12-13 ENCOUNTER — Encounter: Payer: Self-pay | Admitting: Internal Medicine

## 2021-12-13 ENCOUNTER — Encounter: Payer: Self-pay | Admitting: *Deleted

## 2021-12-13 ENCOUNTER — Telehealth: Payer: Self-pay | Admitting: *Deleted

## 2021-12-13 ENCOUNTER — Inpatient Hospital Stay (HOSPITAL_BASED_OUTPATIENT_CLINIC_OR_DEPARTMENT_OTHER): Payer: BC Managed Care – PPO | Admitting: Internal Medicine

## 2021-12-13 ENCOUNTER — Ambulatory Visit: Payer: BC Managed Care – PPO

## 2021-12-13 ENCOUNTER — Ambulatory Visit: Payer: BC Managed Care – PPO | Admitting: Internal Medicine

## 2021-12-13 ENCOUNTER — Inpatient Hospital Stay (HOSPITAL_BASED_OUTPATIENT_CLINIC_OR_DEPARTMENT_OTHER): Payer: BC Managed Care – PPO | Admitting: Hospice and Palliative Medicine

## 2021-12-13 DIAGNOSIS — Z515 Encounter for palliative care: Secondary | ICD-10-CM

## 2021-12-13 DIAGNOSIS — F419 Anxiety disorder, unspecified: Secondary | ICD-10-CM | POA: Diagnosis not present

## 2021-12-13 DIAGNOSIS — Z79899 Other long term (current) drug therapy: Secondary | ICD-10-CM | POA: Diagnosis not present

## 2021-12-13 DIAGNOSIS — I2510 Atherosclerotic heart disease of native coronary artery without angina pectoris: Secondary | ICD-10-CM | POA: Diagnosis not present

## 2021-12-13 DIAGNOSIS — C3411 Malignant neoplasm of upper lobe, right bronchus or lung: Secondary | ICD-10-CM

## 2021-12-13 DIAGNOSIS — Z5111 Encounter for antineoplastic chemotherapy: Secondary | ICD-10-CM | POA: Diagnosis not present

## 2021-12-13 DIAGNOSIS — R590 Localized enlarged lymph nodes: Secondary | ICD-10-CM | POA: Diagnosis not present

## 2021-12-13 DIAGNOSIS — C4442 Squamous cell carcinoma of skin of scalp and neck: Secondary | ICD-10-CM | POA: Diagnosis not present

## 2021-12-13 DIAGNOSIS — C787 Secondary malignant neoplasm of liver and intrahepatic bile duct: Secondary | ICD-10-CM | POA: Diagnosis not present

## 2021-12-13 DIAGNOSIS — C7951 Secondary malignant neoplasm of bone: Secondary | ICD-10-CM | POA: Diagnosis not present

## 2021-12-13 LAB — COMPREHENSIVE METABOLIC PANEL
ALT: 35 U/L (ref 0–44)
AST: 26 U/L (ref 15–41)
Albumin: 4.2 g/dL (ref 3.5–5.0)
Alkaline Phosphatase: 81 U/L (ref 38–126)
Anion gap: 8 (ref 5–15)
BUN: 17 mg/dL (ref 6–20)
CO2: 23 mmol/L (ref 22–32)
Calcium: 9.2 mg/dL (ref 8.9–10.3)
Chloride: 107 mmol/L (ref 98–111)
Creatinine, Ser: 1.02 mg/dL (ref 0.61–1.24)
GFR, Estimated: >60 mL/min (ref >60)
Glucose, Bld: 121 mg/dL — ABNORMAL HIGH (ref 70–99)
Potassium: 3.7 mmol/L (ref 3.5–5.1)
Sodium: 138 mmol/L (ref 135–145)
Total Bilirubin: 0.4 mg/dL (ref 0.3–1.2)
Total Protein: 7.7 g/dL (ref 6.5–8.1)

## 2021-12-13 LAB — CBC WITH DIFFERENTIAL/PLATELET
Abs Immature Granulocytes: 0.02 10*3/uL (ref 0.00–0.07)
Basophils Absolute: 0 10*3/uL (ref 0.0–0.1)
Basophils Relative: 1 %
Eosinophils Absolute: 0 10*3/uL (ref 0.0–0.5)
Eosinophils Relative: 1 %
HCT: 44.4 % (ref 39.0–52.0)
Hemoglobin: 15.2 g/dL (ref 13.0–17.0)
Immature Granulocytes: 1 %
Lymphocytes Relative: 22 %
Lymphs Abs: 0.9 10*3/uL (ref 0.7–4.0)
MCH: 30.6 pg (ref 26.0–34.0)
MCHC: 34.2 g/dL (ref 30.0–36.0)
MCV: 89.3 fL (ref 80.0–100.0)
Monocytes Absolute: 0.8 10*3/uL (ref 0.1–1.0)
Monocytes Relative: 18 %
Neutro Abs: 2.5 10*3/uL (ref 1.7–7.7)
Neutrophils Relative %: 57 %
Platelets: 253 10*3/uL (ref 150–400)
RBC: 4.97 MIL/uL (ref 4.22–5.81)
RDW: 15.9 % — ABNORMAL HIGH (ref 11.5–15.5)
WBC: 4.3 10*3/uL (ref 4.0–10.5)
nRBC: 0 % (ref 0.0–0.2)

## 2021-12-13 MED ORDER — CYANOCOBALAMIN 1000 MCG/ML IJ SOLN
1000.0000 ug | Freq: Once | INTRAMUSCULAR | Status: AC
  Administered 2021-12-13: 1000 ug via INTRAMUSCULAR
  Filled 2021-12-13: qty 1

## 2021-12-13 MED ORDER — SODIUM CHLORIDE 0.9 % IV SOLN
710.0000 mg | Freq: Once | INTRAVENOUS | Status: AC
  Administered 2021-12-13: 710 mg via INTRAVENOUS
  Filled 2021-12-13: qty 71

## 2021-12-13 MED ORDER — SODIUM CHLORIDE 0.9 % IV SOLN
Freq: Once | INTRAVENOUS | Status: AC
  Filled 2021-12-13: qty 250

## 2021-12-13 MED ORDER — SODIUM CHLORIDE 0.9 % IV SOLN
500.0000 mg/m2 | Freq: Once | INTRAVENOUS | Status: AC
  Administered 2021-12-13: 1200 mg via INTRAVENOUS
  Filled 2021-12-13: qty 40

## 2021-12-13 MED ORDER — SODIUM CHLORIDE 0.9 % IV SOLN
10.0000 mg | Freq: Once | INTRAVENOUS | Status: AC
  Administered 2021-12-13: 10 mg via INTRAVENOUS
  Filled 2021-12-13: qty 10

## 2021-12-13 MED ORDER — SODIUM CHLORIDE 0.9 % IV SOLN
350.0000 mg | Freq: Once | INTRAVENOUS | Status: AC
  Administered 2021-12-13: 350 mg via INTRAVENOUS
  Filled 2021-12-13: qty 7

## 2021-12-13 MED ORDER — PALONOSETRON HCL INJECTION 0.25 MG/5ML
0.2500 mg | Freq: Once | INTRAVENOUS | Status: AC
  Administered 2021-12-13: 0.25 mg via INTRAVENOUS
  Filled 2021-12-13: qty 5

## 2021-12-13 MED ORDER — SODIUM CHLORIDE 0.9 % IV SOLN
150.0000 mg | Freq: Once | INTRAVENOUS | Status: AC
  Administered 2021-12-13: 150 mg via INTRAVENOUS
  Filled 2021-12-13: qty 150

## 2021-12-13 NOTE — Telephone Encounter (Signed)
patient's wife requested to speak to an RN while she was in the clinic. I was asked by Jerene Pitch in scheduling to speak to wife. Wife had [employer] on the phone and the agency wanted to speak to an RN to confirm that the patient was indeed in the clinic receiving treatment. I spoke with the representative on the phone and confirmed that patient is in the clinic and actively receiving his infusion. Representative stated that [facility] always confirmed this on the phone while the patient was in the clinic. The company is used to contacting the wife personally and speaking directly to the doctor's team. I acknowledge this as their normal process; however I did explain to the rep at [employer] that I can not always guarantee that an oncology nurse would be available during the time the company actually calls the wife directly. I provided the representative with our direct phone number to the clinic, which she stated she didn't have on file and the fax number. The representative thanked me for this information. Wife thanked me for confirming that patient was in the clinic. No further clinical information was given to [place].

## 2021-12-13 NOTE — Patient Instructions (Signed)
[facility] [place]  Discharge Instructions: Thank you for choosing [facility] to provide your oncology and hematology care.  If you have a lab appointment with the [facility], please go directly to the [facility] and check in at the registration area.  Wear comfortable clothing and clothing appropriate for easy access to any Portacath or PICC line.   We strive to give you quality time with your provider. You may need to reschedule your appointment if you arrive late (15 or more minutes).  Arriving late affects you and other patients whose appointments are after yours.  Also, if you miss three or more appointments without notifying the office, you may be dismissed from the clinic at the providers discretion.      For prescription refill requests, have your pharmacy contact our office and allow 72 hours for refills to be completed.    Today you received the following chemotherapy and/or immunotherapy agents ALIMTA, LIBTAYO, CARBOPLATIN      To help prevent nausea and vomiting after your treatment, we encourage you to take your nausea medication as directed.  BELOW ARE SYMPTOMS THAT SHOULD BE REPORTED IMMEDIATELY: *FEVER GREATER THAN 100.4 F (38 C) OR HIGHER *CHILLS OR SWEATING *NAUSEA AND VOMITING THAT IS NOT CONTROLLED WITH YOUR NAUSEA MEDICATION *UNUSUAL SHORTNESS OF BREATH *UNUSUAL BRUISING OR BLEEDING *URINARY PROBLEMS (pain or burning when urinating, or frequent urination) *BOWEL PROBLEMS (unusual diarrhea, constipation, pain near the anus) TENDERNESS IN MOUTH AND THROAT WITH OR WITHOUT PRESENCE OF ULCERS (sore throat, sores in mouth, or a toothache) UNUSUAL RASH, SWELLING OR PAIN  UNUSUAL VAGINAL DISCHARGE OR ITCHING   Items with * indicate a potential emergency and should be followed up as soon as possible or go to the Emergency Department if any problems should occur.  Please show the CHEMOTHERAPY ALERT CARD or IMMUNOTHERAPY ALERT  CARD at check-in to the Emergency Department and triage nurse.  Should you have questions after your visit or need to cancel or reschedule your appointment, please contact [facility] [place] [facility] [place]  [phone] and follow the prompts.  Office hours are 8:00 a.m. to 4:30 p.m. Monday - Friday. Please note that voicemails left after 4:00 p.m. may not be returned until the following business day.  We are closed weekends and major holidays. You have access to a nurse at all times for urgent questions. Please call the main number to the clinic [phone] and follow the prompts.  For any non-urgent questions, you may also contact your provider using MyChart. We now offer e-Visits for anyone 34 and older to request care online for non-urgent symptoms. For details visit [email or web address].   Also download the MyChart app! Go to the app store, search "MyChart", open the app, select [facility], and log in with your MyChart username and password.  Due to Covid, a mask is required upon entering the hospital/clinic. If you do not have a mask, one will be given to you upon arrival. For doctor visits, patients may have 1 support person aged 75 or older with them. For treatment visits, patients cannot have anyone with them due to current Covid guidelines and our immunocompromised population.   Pemetrexed injection What is this medication? PEMETREXED (PEM e TREX ed) is a chemotherapy drug used to treat lung cancers like non-small cell lung cancer and mesothelioma. It may also be used to treat other cancers. This medicine may be used for other purposes; ask your health care provider or pharmacist if you  have questions. COMMON BRAND NAME(S): Alimta, PEMFEXY What should I tell my care team before I take this medication? They need to know if you have any of these conditions: infection (especially a virus infection such as chickenpox, cold sores, or herpes) kidney disease low blood counts,  like low white cell, platelet, or red cell counts lung or breathing disease, like asthma radiation therapy an unusual or allergic reaction to pemetrexed, other medicines, foods, dyes, or preservative pregnant or trying to get pregnant breast-feeding How should I use this medication? This drug is given as an infusion into a vein. It is administered in a hospital or clinic by a specially trained health care professional. Talk to your pediatrician regarding the use of this medicine in children. Special care may be needed. Overdosage: If you think you have taken too much of this medicine contact a poison control center or emergency room at once. NOTE: This medicine is only for you. Do not share this medicine with others. What if I miss a dose? It is important not to miss your dose. Call your doctor or health care professional if you are unable to keep an appointment. What may interact with this medication? This medicine may interact with the following medications: Ibuprofen This list may not describe all possible interactions. Give your health care provider a list of all the medicines, herbs, non-prescription drugs, or dietary supplements you use. Also tell them if you smoke, drink alcohol, or use illegal drugs. Some items may interact with your medicine. What should I watch for while using this medication? Visit your doctor for checks on your progress. This drug may make you feel generally unwell. This is not uncommon, as chemotherapy can affect healthy cells as well as cancer cells. Report any side effects. Continue your course of treatment even though you feel ill unless your doctor tells you to stop. In some cases, you may be given additional medicines to help with side effects. Follow all directions for their use. Call your doctor or health care professional for advice if you get a fever, chills or sore throat, or other symptoms of a cold or flu. Do not treat yourself. This drug decreases your  body's ability to fight infections. Try to avoid being around people who are sick. This medicine may increase your risk to bruise or bleed. Call your doctor or health care professional if you notice any unusual bleeding. Be careful brushing and flossing your teeth or using a toothpick because you may get an infection or bleed more easily. If you have any dental work done, tell your dentist you are receiving this medicine. Avoid taking products that contain aspirin, acetaminophen, ibuprofen, naproxen, or ketoprofen unless instructed by your doctor. These medicines may hide a fever. Call your doctor or health care professional if you get diarrhea or mouth sores. Do not treat yourself. To protect your kidneys, drink water or other fluids as directed while you are taking this medicine. Do not become pregnant while taking this medicine or for 6 months after stopping it. Women should inform their doctor if they wish to become pregnant or think they might be pregnant. Men should not father a child while taking this medicine and for 3 months after stopping it. This may interfere with the ability to father a child. You should talk to your doctor or health care professional if you are concerned about your fertility. There is a potential for serious side effects to an unborn child. Talk to your health care professional or  pharmacist for more information. Do not breast-feed an infant while taking this medicine or for 1 week after stopping it. What side effects may I notice from receiving this medication? Side effects that you should report to your doctor or health care professional as soon as possible: allergic reactions like skin rash, itching or hives, swelling of the face, lips, or tongue breathing problems redness, blistering, peeling or loosening of the skin, including inside the mouth signs and symptoms of bleeding such as bloody or black, tarry stools; red or dark-brown urine; spitting up blood or brown  material that looks like coffee grounds; red spots on the skin; unusual bruising or bleeding from the eye, gums, or nose signs and symptoms of infection like fever or chills; cough; sore throat; pain or trouble passing urine signs and symptoms of kidney injury like trouble passing urine or change in the amount of urine signs and symptoms of liver injury like dark yellow or brown urine; general ill feeling or flu-like symptoms; light-colored stools; loss of appetite; nausea; right upper belly pain; unusually weak or tired; yellowing of the eyes or skin Side effects that usually do not require medical attention (report to your doctor or health care professional if they continue or are bothersome): constipation mouth sores nausea, vomiting unusually weak or tired This list may not describe all possible side effects. Call your doctor for medical advice about side effects. You may report side effects to FDA at 1-800-FDA-1088. Where should I keep my medication? This drug is given in a hospital or clinic and will not be stored at home. NOTE: This sheet is a summary. It may not cover all possible information. If you have questions about this medicine, talk to your doctor, pharmacist, or health care provider.  2022 Elsevier/Gold Standard (2017-12-10 00:00:00)  Cemiplimab injection What is this medication? CEMIPLIMAB (se mip li mab) is a monoclonal antibody. It treats certain types of cancer. Some of the cancers treated are cutaneous squamous cell carcinoma and basal cell carcinoma. This medicine may be used for other purposes; ask your health care provider or pharmacist if you have questions. COMMON BRAND NAME(S): LIBTAYO What should I tell my care team before I take this medication? They need to know if you have any of these conditions: autoimmune diseases like Crohn's disease, ulcerative colitis, or lupus have had or planning to have an allogeneic stem cell transplant (uses someone else's stem  cells) history of organ transplant nervous system problems like myasthenia gravis or Guillain-Barre syndrome an unusual or allergic reaction to cemiplimab, other drugs, foods, dyes, or preservatives pregnant or trying to get pregnant breast-feeding How should I use this medication? This medicine is for infusion into a vein. It is given by a health care professional in a hospital or clinic setting. A special MedGuide will be given to you before each treatment. Be sure to read this information carefully each time. Talk to your pediatrician regarding the use of this medicine in children. Special care may be needed. Overdosage: If you think you have taken too much of this medicine contact a poison control center or emergency room at once. NOTE: This medicine is only for you. Do not share this medicine with others. What if I miss a dose? It is important not to miss your dose. Call your doctor or health care professional if you are unable to keep an appointment. What may interact with this medication? Interactions have not been studied. This list may not describe all possible interactions. Give your health  care provider a list of all the medicines, herbs, non-prescription drugs, or dietary supplements you use. Also tell them if you smoke, drink alcohol, or use illegal drugs. Some items may interact with your medicine. What should I watch for while using this medication? Your condition will be monitored carefully while you are receiving this medicine. You may need blood work done while you are taking this medicine. Do not become pregnant while taking this medicine or for at least 4 months after stopping it. Women should inform their doctor if they wish to become pregnant or think they might be pregnant. There is a potential for serious side effects to an unborn child. Talk to your health care professional or pharmacist for more information. Do not breast-feed an infant while taking this medicine or for  at least 4 months after the last dose. What side effects may I notice from receiving this medication? Side effects that you should report to your doctor or health care professional as soon as possible: allergic reactions like skin rash, itching or hives; swelling of the face, lips, or tongue black, tarry stools bloody or watery diarrhea breathing problems changes in vision changes in voice chest pain or chest tightness chills cough dizziness fast or irregular heart beat feeling faint or lightheaded hair loss increased hunger or thirst muscle weakness persistent headache redness, blistering, peeling or loosening of the skin, including inside the mouth signs and symptoms of kidney injury like trouble passing urine or change in the amount of urine signs and symptoms of liver injury like dark yellow or brown urine; general ill feeling or flu-like symptoms; light-colored stools; loss of appetite; nausea; right upper belly pain; unusually weak or tired; yellowing of the eyes or skin stomach pain unusual bleeding or bruising weight gain or weight loss unusual sweating Side effects that usually do not require medical attention (report these to your doctor or health care professional if they continue or are bothersome): bone pain constipation muscle pain tiredness This list may not describe all possible side effects. Call your doctor for medical advice about side effects. You may report side effects to FDA at 1-800-FDA-1088. Where should I keep my medication? This drug is given in a hospital or clinic and will not be stored at home. NOTE: This sheet is a summary. It may not cover all possible information. If you have questions about this medicine, talk to your doctor, pharmacist, or health care provider.  2022 Elsevier/Gold Standard (2021-07-04 00:00:00)  Carboplatin injection What is this medication? CARBOPLATIN (KAR boe pla tin) is a chemotherapy drug. It targets fast dividing cells,  like cancer cells, and causes these cells to die. This medicine is used to treat ovarian cancer and many other cancers. This medicine may be used for other purposes; ask your health care provider or pharmacist if you have questions. COMMON BRAND NAME(S): Paraplatin What should I tell my care team before I take this medication? They need to know if you have any of these conditions: blood disorders hearing problems kidney disease recent or ongoing radiation therapy an unusual or allergic reaction to carboplatin, cisplatin, other chemotherapy, other medicines, foods, dyes, or preservatives pregnant or trying to get pregnant breast-feeding How should I use this medication? This drug is usually given as an infusion into a vein. It is administered in a hospital or clinic by a specially trained health care professional. Talk to your pediatrician regarding the use of this medicine in children. Special care may be needed. Overdosage: If you think you  have taken too much of this medicine contact a poison control center or emergency room at once. NOTE: This medicine is only for you. Do not share this medicine with others. What if I miss a dose? It is important not to miss a dose. Call your doctor or health care professional if you are unable to keep an appointment. What may interact with this medication? medicines for seizures medicines to increase blood counts like filgrastim, pegfilgrastim, sargramostim some antibiotics like amikacin, gentamicin, neomycin, streptomycin, tobramycin vaccines Talk to your doctor or health care professional before taking any of these medicines: acetaminophen aspirin ibuprofen ketoprofen naproxen This list may not describe all possible interactions. Give your health care provider a list of all the medicines, herbs, non-prescription drugs, or dietary supplements you use. Also tell them if you smoke, drink alcohol, or use illegal drugs. Some items may interact with  your medicine. What should I watch for while using this medication? Your condition will be monitored carefully while you are receiving this medicine. You will need important blood work done while you are taking this medicine. This drug may make you feel generally unwell. This is not uncommon, as chemotherapy can affect healthy cells as well as cancer cells. Report any side effects. Continue your course of treatment even though you feel ill unless your doctor tells you to stop. In some cases, you may be given additional medicines to help with side effects. Follow all directions for their use. Call your doctor or health care professional for advice if you get a fever, chills or sore throat, or other symptoms of a cold or flu. Do not treat yourself. This drug decreases your body's ability to fight infections. Try to avoid being around people who are sick. This medicine may increase your risk to bruise or bleed. Call your doctor or health care professional if you notice any unusual bleeding. Be careful brushing and flossing your teeth or using a toothpick because you may get an infection or bleed more easily. If you have any dental work done, tell your dentist you are receiving this medicine. Avoid taking products that contain aspirin, acetaminophen, ibuprofen, naproxen, or ketoprofen unless instructed by your doctor. These medicines may hide a fever. Do not become pregnant while taking this medicine. Women should inform their doctor if they wish to become pregnant or think they might be pregnant. There is a potential for serious side effects to an unborn child. Talk to your health care professional or pharmacist for more information. Do not breast-feed an infant while taking this medicine. What side effects may I notice from receiving this medication? Side effects that you should report to your doctor or health care professional as soon as possible: allergic reactions like skin rash, itching or hives,  swelling of the face, lips, or tongue signs of infection - fever or chills, cough, sore throat, pain or difficulty passing urine signs of decreased platelets or bleeding - bruising, pinpoint red spots on the skin, black, tarry stools, nosebleeds signs of decreased red blood cells - unusually weak or tired, fainting spells, lightheadedness breathing problems changes in hearing changes in vision chest pain high blood pressure low blood counts - This drug may decrease the number of white blood cells, red blood cells and platelets. You may be at increased risk for infections and bleeding. nausea and vomiting pain, swelling, redness or irritation at the injection site pain, tingling, numbness in the hands or feet problems with balance, talking, walking trouble passing urine or change in  the amount of urine Side effects that usually do not require medical attention (report to your doctor or health care professional if they continue or are bothersome): hair loss loss of appetite metallic taste in the mouth or changes in taste This list may not describe all possible side effects. Call your doctor for medical advice about side effects. You may report side effects to FDA at 1-800-FDA-1088. Where should I keep my medication? This drug is given in a hospital or clinic and will not be stored at home. NOTE: This sheet is a summary. It may not cover all possible information. If you have questions about this medicine, talk to your doctor, pharmacist, or health care provider.  2022 Elsevier/Gold Standard (2008-03-24 00:00:00)

## 2021-12-13 NOTE — Progress Notes (Signed)
[place] at [facility] [phone]) [phone] [phone]) [phone]   Name: Chris [name] Date: 12/13/2021 MRN: [id number]  DOB: [birth year]  Patient Care Team: Casilda Carls, MD as PCP - General (Internal Medicine) Lorretta Harp, MD as PCP - Cardiology (Cardiology) Curt Bears, MD as Consulting Physician (Oncology) Casilda Carls, MD as Referring Physician (Internal Medicine) Cammie Sickle, MD as Consulting Physician (Internal Medicine) Cammie Sickle, MD as Consulting Physician (Internal Medicine)    REASON FOR CONSULTATION: Chris [name] is a 61 y.o. male with multiple medical problems including stage IV non-small cell lung cancer with metastasis to femur/liver diagnosed in December 2022.  Patient is currently on treatment with carbo/Alimta/Libtayo but has had poor tolerance to treatment.  He is referred to radiation oncology for SBRT of right upper lobe lung lesion and right proximal femoral lesion.  Patient has had severe anxiety and was referred to palliative care to help address goals and manage ongoing symptoms.  SOCIAL HISTORY:     reports that he has been smoking cigarettes. He has a 63.00 pack-year smoking history. He has never used smokeless tobacco. He reports that he does not drink alcohol and does not use drugs.  Patient is married and lives at home with his wife.  They have a son and recently had grandchildren.  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  ADVANCE DIRECTIVES:  Not on file  CODE STATUS:   PAST MEDICAL HISTORY: Past Medical History:  Diagnosis Date   Anxiety    associated with medical care,  worsened by difficulty hearing, does better with wife present   Complication of anesthesia    wife states very anxious may need pre sedation   Coronary artery disease    GERD (gastroesophageal reflux disease)    Hearing loss    mild per wife   Hearing loss    no hearing aids per wife    Hyperlipidemia    Hypertension    MI (myocardial infarction) ([place]) 04/17/2006   Acute inferolateral wall MI with cardiogenic shock and complete heart block   Primary adenocarcinoma of upper lobe of right lung ([place])    Prostate cancer ([place])    Tobacco abuse    Wears glasses    Wears partial dentures    Upper    PAST SURGICAL HISTORY:  Past Surgical History:  Procedure Laterality Date   BRONCHIAL BIOPSY  10/09/2021   Procedure: BRONCHIAL BIOPSIES;  Surgeon: Collene Gobble, MD;  Location: [facility];  Service: Pulmonary;;   BRONCHIAL BRUSHINGS  10/09/2021   Procedure: BRONCHIAL BRUSHINGS;  Surgeon: Collene Gobble, MD;  Location: [facility];  Service: Pulmonary;;   BRONCHIAL NEEDLE ASPIRATION BIOPSY  10/09/2021   Procedure: BRONCHIAL NEEDLE ASPIRATION BIOPSIES;  Surgeon: Collene Gobble, MD;  Location: [facility];  Service: Pulmonary;;   CARDIAC CATHETERIZATION  2007   left, RCA 100% occluded ruptured plaque with thrombus in the proximal segment   CYST EXCISION N/A 08/30/2021   Procedure: EXCISION OF POSTERIOR SCALP CYST;  Surgeon: Clovis Riley, MD;  Location: [facility];  Service: General;  Laterality: N/A;   CYSTOSCOPY N/A 07/17/2018   Procedure: Consuela Mimes;  Surgeon: Franchot Gallo, MD;  Location: [facility];  Service: Urology;  Laterality: N/A;  no seeds in bladder per Dr Diona Fanti   FIDUCIAL MARKER PLACEMENT  10/09/2021   Procedure: FIDUCIAL MARKER PLACEMENT;  Surgeon: Collene Gobble, MD;  Location: [facility];  Service: Pulmonary;;   HAND SURGERY Right  has metal plate in arm   PROSTATE BIOPSY     RADIOACTIVE SEED IMPLANT N/A 07/17/2018   Procedure: RADIOACTIVE SEED IMPLANT/BRACHYTHERAPY IMPLANT;  Surgeon: Franchot Gallo, MD;  Location: [facility];  Service: Urology;  Laterality: N/A;  77 seeds   SPACE OAR INSTILLATION N/A 07/17/2018   Procedure: SPACE OAR INSTILLATION;  Surgeon: Franchot Gallo, MD;  Location: [facility];  Service: Urology;  Laterality: N/A;   VIDEO BRONCHOSCOPY WITH RADIAL ENDOBRONCHIAL ULTRASOUND  10/09/2021   Procedure: VIDEO BRONCHOSCOPY WITH RADIAL ENDOBRONCHIAL ULTRASOUND;  Surgeon: Collene Gobble, MD;  Location: [facility];  Service: Pulmonary;;   WRIST SURGERY  10/04/2012    HEMATOLOGY/ONCOLOGY HISTORY:  Oncology History Overview Note  DIAGNOSIS: 1) stage IV (T1c, N0, M1 C) non-small cell lung cancer favoring adenocarcinoma presented with right lung apical nodule in addition to metastatic disease in the right hepatic lobe and the proximal right femoral diaphysis diagnosed and December 2022. 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 LUNG, RUL, FINE NEEDLE ASPIRATION:  - Malignant cells consistent with non-small cell carcinoma, see comment   B. LUNG, RUL, BRUSHING:  - Malignant cells consistent with non-small cell carcinoma, see comment       COMMENT:   A and B.  Dr. Saralyn Pilar reviewed the case and concurs with the diagnosis.  Only rare malignant cells are present on the cellblock.  Immunohistochemical stains were attempted and show that the tumor cells  have patchy staining for TTF-1 whereas p63, p40 and CK5/6 are negative.  The findings are nondiagnostic but suggestive of a lung adenocarcinoma.  Dr. Lamonte Sakai was notified on 10/13/2021.   SEP-OCT 2022-  [Dermatology]-   Poorly differentiated squamous cell carcinoma; -  Carcinoma extends to the edges of the excision specimen   IMPRESSION: 1. The spiculated nodule at the right lung apex on recent neck CT is hypermetabolic and is concerning for primary bronchogenic carcinoma. Tissue sampling recommended. 2. Hypermetabolic activity within the occipital scalp and small previously demonstrated right occipital lymph node compatible  with known squamous cell carcinoma. Evaluation of the head and neck limited by motion artifact. 3. Hypermetabolic lesions inferiorly in the right hepatic lobe and in the proximal right femoral diaphysis suspicious for metastatic disease, primary uncertain in this patient with a history of prostate cancer. Correlate with PSA levels. Abdominal MRI without and with contrast may be helpful for further characterization of the liver lesion.  # 2007MI-CAD [s/p stent-Dr.Berry; EF 2021- 58%]     Primary adenocarcinoma of upper lobe of right lung ([place])  10/18/2021 Initial Diagnosis   Primary adenocarcinoma of upper lobe of right lung ([place])   10/18/2021 Cancer Staging   Staging form: Lung, AJCC 8th Edition - Clinical: Stage IVB (cT1c, cN0, cM1c) - Signed by Curt Bears, MD on 10/18/2021   11/01/2021 -  Chemotherapy   Patient is on Treatment Plan : LUNG NSCLC Pemetrexed + Carboplatin q21d x 4 Cycles       ALLERGIES:  has No Known Allergies.  MEDICATIONS:  Current Outpatient Medications  Medication Sig Dispense Refill   ALPRAZolam (XANAX) 0.25 MG tablet Take 1 tablet (0.25 mg total) by mouth at bedtime. 15 tablet 0   atorvastatin (LIPITOR) 20 MG tablet TAKE 1 TABLET BY MOUTH EVERY DAY 90 tablet 3   clopidogrel (PLAVIX) 75 MG tablet Take 1 tablet (  75 mg total) by mouth daily. TAKE 1 TABLET (75 MG TOTAL) BY MOUTH DAILY. Okay to restart this medication on 10/10/2021. 90 tablet 3   folic acid (FOLVITE) 1 MG tablet Take 1 tablet (1 mg total) by mouth daily. 30 tablet 4   ketoconazole (NIZORAL) 2 % cream Apply 1 application topically 2 (two) times daily as needed for irritation.     metoprolol tartrate (LOPRESSOR) 25 MG tablet Take 1 tablet (25 mg total) by mouth daily. 30 tablet 11   omeprazole (PRILOSEC OTC) 20 MG tablet Take 20 mg by mouth daily.     prochlorperazine (COMPAZINE) 10 MG tablet Take 1 tablet (10 mg total) by mouth every 6 (six) hours as needed for nausea or vomiting. 30 tablet  1   sertraline (ZOLOFT) 50 MG tablet Take 1 tablet (50 mg total) by mouth daily. 90 tablet 0   tamsulosin (FLOMAX) 0.4 MG CAPS capsule Take 1 capsule (0.4 mg total) by mouth daily. 30 capsule 3   No current facility-administered medications for this visit.   Facility-Administered Medications Ordered in Other Visits  Medication Dose Route Frequency Provider Last Rate Last Admin   CARBOplatin (PARAPLATIN) 710 mg in sodium chloride 0.9 % 250 mL chemo infusion  710 mg Intravenous Once Cammie Sickle, MD 642 mL/hr at 12/13/21 1133 710 mg at 12/13/21 1133    VITAL SIGNS: There were no vitals taken for this visit. There were no vitals filed for this visit.  Estimated body mass index is 33.39 kg/m as calculated from the following:   Height as of an earlier encounter on 12/13/21: _0  (1.803 m).   Weight as of an earlier encounter on 12/13/21: 239 lb 6.4 oz (108.6 kg).  LABS: CBC:    Component Value Date/Time   WBC 4.3 12/13/2021 0802   HGB 15.2 12/13/2021 0802   HGB 16.4 10/31/2021 1107   HCT 44.4 12/13/2021 0802   PLT 253 12/13/2021 0802   PLT 217 10/31/2021 1107   MCV 89.3 12/13/2021 0802   NEUTROABS 2.5 12/13/2021 0802   LYMPHSABS 0.9 12/13/2021 0802   MONOABS 0.8 12/13/2021 0802   EOSABS 0.0 12/13/2021 0802   BASOSABS 0.0 12/13/2021 0802   Comprehensive Metabolic Panel:    Component Value Date/Time   NA 138 12/13/2021 0802   K 3.7 12/13/2021 0802   CL 107 12/13/2021 0802   CO2 23 12/13/2021 0802   BUN 17 12/13/2021 0802   CREATININE 1.02 12/13/2021 0802   CREATININE 1.08 10/31/2021 1107   GLUCOSE 121 (H) 12/13/2021 0802   CALCIUM 9.2 12/13/2021 0802   AST 26 12/13/2021 0802   AST 15 10/31/2021 1107   ALT 35 12/13/2021 0802   ALT 17 10/31/2021 1107   ALKPHOS 81 12/13/2021 0802   BILITOT 0.4 12/13/2021 0802   BILITOT 0.7 10/31/2021 1107   PROT 7.7 12/13/2021 0802   ALBUMIN 4.2 12/13/2021 0802    RADIOGRAPHIC STUDIES: CT CHEST W CONTRAST  Result Date:  12/08/2021 CLINICAL DATA:  61 year old male with history of non-small cell carcinoma. EXAM: CT CHEST WITH CONTRAST TECHNIQUE: Multidetector CT imaging of the chest was performed during intravenous contrast administration. RADIATION DOSE REDUCTION: This exam was performed according to the departmental dose-optimization program which includes automated exposure control, adjustment of the mA and/or kV according to patient size and/or use of iterative reconstruction technique. CONTRAST:  43m OMNIPAQUE IOHEXOL 300 MG/ML  SOLN COMPARISON:  Chest CT 10/06/2021. FINDINGS: Cardiovascular: Heart size is normal. There is no significant pericardial fluid, thickening  or pericardial calcification. There is aortic atherosclerosis, as well as atherosclerosis of the great vessels of the mediastinum and the coronary arteries, including calcified atherosclerotic plaque in the left anterior descending, left circumflex and right coronary arteries. Mediastinum/Nodes: No pathologically enlarged mediastinal or hilar lymph nodes. Esophagus is unremarkable in appearance. No axillary lymphadenopathy. Lungs/Pleura: Previously noted right upper lobe pulmonary nodule is similar to the prior examination (axial image 25 of series 3) measuring 2.5 x 1.5 cm (previously 2.3 x 1.5 cm). Inferior to the lesion there is a fiducial marker. No other suspicious appearing pulmonary nodules or masses are noted. No acute consolidative airspace disease. No pleural effusions. Mild chronic post infectious or inflammatory scarring in the inferior segment of the lingula is again noted. Upper Abdomen: Aortic atherosclerosis. Bilateral adrenal nodules, stable compared to the prior noncontrast CT examination, previously characterized as benign adenomas, largest of which is on the right side measuring 2.8 x 1.8 cm. Musculoskeletal: There are no aggressive appearing lytic or blastic lesions noted in the visualized portions of the skeleton. IMPRESSION: 1. Roughly stable  size of aggressive appearing right upper lobe pulmonary nodule which was previously demonstrated to be hypermetabolic on prior PET-CT 46/65/9935, compatible with reported clinical history of primary bronchogenic neoplasm (likely adenocarcinoma). 2. No new pulmonary nodules or lymphadenopathy to suggest metastatic disease in the chest. 3. Aortic atherosclerosis, in addition to three-vessel coronary artery disease. Please note that although the presence of coronary artery calcium documents the presence of coronary artery disease, the severity of this disease and any potential stenosis cannot be assessed on this non-gated CT examination. Assessment for potential risk factor modification, dietary therapy or pharmacologic therapy may be warranted, if clinically indicated. 4. Bilateral adrenal adenomas again noted. Aortic Atherosclerosis (ICD10-I70.0). Electronically Signed   By: Vinnie Langton M.D.   On: 12/08/2021 11:04    PERFORMANCE STATUS (ECOG) : 1 - Symptomatic but completely ambulatory  Review of Systems Unless otherwise noted, a complete review of systems is negative.  Physical Exam General: NAD Pulmonary: Unlabored Extremities: no edema, no joint deformities Skin: Scaly, erythematous rash beneath pannus Neurological: Weakness but otherwise nonfocal  IMPRESSION: Follow-up visit.  Patient seen in infusion.  Patient reports that he is doing well.  He denies any significant symptomatic complaints.  He is looking forward to an upcoming beach trip to [place] with his son.  I also met with patient's son and wife.  Emotional support provided to family and we discussed their coping with patient's current illness.  Wife is interested in completing ACP/MOST form in the future.  PLAN: -Continue current scope of treatment -Continue Zoloft/alprazolam -ACP/MOST form reviewed -RTC 3 weeks   Patient expressed understanding and was in agreement with this plan. He also understands that He can call  the clinic at any time with any questions, concerns, or complaints.     Time Total: 45 minutes  Visit consisted of counseling and education dealing with the complex and emotionally intense issues of symptom management and palliative care in the setting of serious and potentially life-threatening illness.Greater than 50%  of this time was spent counseling and coordinating care related to the above assessment and plan.  Signed by: Altha Harm, PhD, NP-C

## 2021-12-13 NOTE — Progress Notes (Signed)
Pt having leg pain.  He has a knot that has come up on his lt upper arm.  A rash has come up on his arms since chemo.

## 2021-12-13 NOTE — Progress Notes (Signed)
[place] CONSULT NOTE  Patient Care Team: Casilda Carls, MD as PCP - General (Internal Medicine) Lorretta Harp, MD as PCP - Cardiology (Cardiology) Curt Bears, MD as Consulting Physician (Oncology) Casilda Carls, MD as Referring Physician (Internal Medicine) Cammie Sickle, MD as Consulting Physician (Internal Medicine) Cammie Sickle, MD as Consulting Physician (Internal Medicine)  CHIEF COMPLAINTS/PURPOSE OF CONSULTATION: lung cancer  #  Oncology History Overview Note  DIAGNOSIS: 1) stage IV (T1c, N0, M1 C) non-small cell lung cancer favoring adenocarcinoma presented with right lung apical nodule in addition to metastatic disease in the right hepatic lobe and the proximal right femoral diaphysis diagnosed and December 2022. 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 LUNG, RUL, FINE NEEDLE ASPIRATION:  - Malignant cells consistent with non-small cell carcinoma, see comment   B. LUNG, RUL, BRUSHING:  - Malignant cells consistent with non-small cell carcinoma, see comment       COMMENT:   A and B.  Dr. Saralyn Pilar reviewed the case and concurs with the diagnosis.  Only rare malignant cells are present on the cellblock.  Immunohistochemical stains were attempted and show that the tumor cells  have patchy staining for TTF-1 whereas p63, p40 and CK5/6 are negative.  The findings are nondiagnostic but suggestive of a lung adenocarcinoma.  Dr. Lamonte Sakai was notified on 10/13/2021.   SEP-OCT 2022-  [Dermatology]-   Poorly differentiated squamous cell carcinoma; -  Carcinoma extends to the edges of the excision specimen   IMPRESSION: 1. The spiculated nodule at the right lung apex on recent neck CT is hypermetabolic and is concerning for primary bronchogenic  carcinoma. Tissue sampling recommended. 2. Hypermetabolic activity within the occipital scalp and small previously demonstrated right occipital lymph node compatible with known squamous cell carcinoma. Evaluation of the head and neck limited by motion artifact. 3. Hypermetabolic lesions inferiorly in the right hepatic lobe and in the proximal right femoral diaphysis suspicious for metastatic disease, primary uncertain in this patient with a history of prostate cancer. Correlate with PSA levels. Abdominal MRI without and with contrast may be helpful for further characterization of the liver lesion.  # 2007MI-CAD [s/p stent-Dr.Berry; EF 2021- 58%]     Primary adenocarcinoma of upper lobe of right lung ([place])  10/18/2021 Initial Diagnosis   Primary adenocarcinoma of upper lobe of right lung ([place])   10/18/2021 Cancer Staging   Staging form: Lung, AJCC 8th Edition - Clinical: Stage IVB (cT1c, cN0, cM1c) - Signed by Curt Bears, MD on 10/18/2021    11/01/2021 -  Chemotherapy   Patient is on Treatment Plan : LUNG NSCLC Pemetrexed + Carboplatin q21d x 4 Cycles        HISTORY OF PRESENTING ILLNESS: Ambulating independently.  Accompanied by his wife; and son.  [name] 61 y.o.  male metastatic stage IV non-small cell lung cancer/favor adenocarcinoma-currently on chemoimmunotherapy here for follow-up/review results of the CT scan.  Patient admits to mild to moderate fatigue few days after chemotherapy.  However denies any nausea vomiting diarrhea.  Denies any skin rash.   Review of Systems  Constitutional:  Positive for malaise/fatigue. Negative for chills, diaphoresis, fever and weight loss.  HENT:  Positive for hearing loss. Negative for nosebleeds and sore throat.   Eyes:  Negative for double vision.  Respiratory:  Negative for cough, hemoptysis, sputum  production, shortness of breath and wheezing.   Cardiovascular:  Negative for chest pain, palpitations, orthopnea and  leg swelling.  Gastrointestinal:  Negative for abdominal pain, blood in stool, diarrhea, heartburn, melena, nausea and vomiting.  Genitourinary:  Negative for dysuria, frequency and urgency.  Musculoskeletal:  Negative for back pain.  Skin: Negative.  Negative for itching and rash.  Neurological:  Negative for dizziness, tingling, focal weakness, weakness and headaches.  Endo/Heme/Allergies:  Does not bruise/bleed easily.  Psychiatric/Behavioral:  Negative for depression. The patient does not have insomnia.     MEDICAL HISTORY:  Past Medical History:  Diagnosis Date   Anxiety    associated with medical care,  worsened by difficulty hearing, does better with wife present   Complication of anesthesia    wife states very anxious may need pre sedation   Coronary artery disease    GERD (gastroesophageal reflux disease)    Hearing loss    mild per wife   Hearing loss    no hearing aids per wife   Hyperlipidemia    Hypertension    MI (myocardial infarction) ([place]) 04/17/2006   Acute inferolateral wall MI with cardiogenic shock and complete heart block   Primary adenocarcinoma of upper lobe of right lung ([place])    Prostate cancer ([place])    Tobacco abuse    Wears glasses    Wears partial dentures    Upper    SURGICAL HISTORY: Past Surgical History:  Procedure Laterality Date   BRONCHIAL BIOPSY  10/09/2021   Procedure: BRONCHIAL BIOPSIES;  Surgeon: Collene Gobble, MD;  Location: [place];  Service: Pulmonary;;   BRONCHIAL BRUSHINGS  10/09/2021   Procedure: BRONCHIAL BRUSHINGS;  Surgeon: Collene Gobble, MD;  Location: [facility] ENDOSCOPY;  Service: Pulmonary;;   BRONCHIAL NEEDLE ASPIRATION BIOPSY  10/09/2021   Procedure: BRONCHIAL NEEDLE ASPIRATION BIOPSIES;  Surgeon: Collene Gobble, MD;  Location: [place] ENDOSCOPY;  Service: Pulmonary;;   CARDIAC CATHETERIZATION  2007   left, RCA 100% occluded ruptured plaque with thrombus in the proximal segment   CYST EXCISION N/A 08/30/2021    Procedure: EXCISION OF POSTERIOR SCALP CYST;  Surgeon: Clovis Riley, MD;  Location: [facility];  Service: General;  Laterality: N/A;   CYSTOSCOPY N/A 07/17/2018   Procedure: Consuela Mimes;  Surgeon: Franchot Gallo, MD;  Location[facility];  Service: Urology;  Laterality: N/A;  no seeds in bladder per Dr Diona Fanti   FIDUCIAL MARKER PLACEMENT  10/09/2021   Procedure: FIDUCIAL MARKER PLACEMENT;  Surgeon: Collene Gobble, MD;  Location: [facility] ENDOSCOPY;  Service: Pulmonary;;   HAND SURGERY Right    has metal plate in arm   PROSTATE BIOPSY     RADIOACTIVE SEED IMPLANT N/A 07/17/2018   Procedure: RADIOACTIVE SEED IMPLANT/BRACHYTHERAPY IMPLANT;  Surgeon: Franchot Gallo, MD;  Location: [facility];  Service: Urology;  Laterality: N/A;  77 seeds   SPACE OAR INSTILLATION N/A 07/17/2018   Procedure: SPACE OAR INSTILLATION;  Surgeon: Franchot Gallo, MD;  Location: [facility];  Service: Urology;  Laterality: N/A;   VIDEO BRONCHOSCOPY WITH RADIAL ENDOBRONCHIAL ULTRASOUND  10/09/2021   Procedure: VIDEO BRONCHOSCOPY WITH RADIAL ENDOBRONCHIAL ULTRASOUND;  Surgeon: Collene Gobble, MD;  Location: [facility] ENDOSCOPY;  Service: Pulmonary;;   WRIST SURGERY  10/04/2012    SOCIAL HISTORY: Social History   Socioeconomic History   Marital status: Married    Spouse name: Not on file   Number of children: Not on file   Years of education: Not on  file   Highest education level: Not on file  Occupational History   Not on file  Tobacco Use   Smoking status: Every Day    Packs/day: 1.50    Years: 42.00    Pack years: 63.00    Types: Cigarettes   Smokeless tobacco: Never  Vaping Use   Vaping Use: Never used  Substance and Sexual Activity   Alcohol use: No   Drug use: No   Sexual activity: Yes    Birth control/protection: None  Other Topics Concern   Not on file  Social History Narrative   10-04 19 Unable to ask abuse questions wife with him today.    Social Determinants of Health   Financial Resource Strain: Not on file  Food Insecurity: Not on file  Transportation Needs: Not on file  Physical Activity: Not on file  Stress: Not on file  Social Connections: Not on file  Intimate Partner Violence: Not on file    FAMILY HISTORY: Family History  Problem Relation Age of Onset   Breast cancer Mother    Prostate cancer Neg Hx    Kidney cancer Neg Hx    Cancer Neg Hx     ALLERGIES:  has No Known Allergies.  MEDICATIONS:  Current Outpatient Medications  Medication Sig Dispense Refill   ALPRAZolam (XANAX) 0.25 MG tablet Take 1 tablet (0.25 mg total) by mouth at bedtime. 15 tablet 0   atorvastatin (LIPITOR) 20 MG tablet TAKE 1 TABLET BY MOUTH EVERY DAY 90 tablet 3   clopidogrel (PLAVIX) 75 MG tablet Take 1 tablet (75 mg total) by mouth daily. TAKE 1 TABLET (75 MG TOTAL) BY MOUTH DAILY. Okay to restart this medication on 10/10/2021. 90 tablet 3   folic acid (FOLVITE) 1 MG tablet Take 1 tablet (1 mg total) by mouth daily. 30 tablet 4   ketoconazole (NIZORAL) 2 % cream Apply 1 application topically 2 (two) times daily as needed for irritation.     metoprolol tartrate (LOPRESSOR) 25 MG tablet Take 1 tablet (25 mg total) by mouth daily. 30 tablet 11   omeprazole (PRILOSEC OTC) 20 MG tablet Take 20 mg by mouth daily.     prochlorperazine (COMPAZINE) 10 MG tablet Take 1 tablet (10 mg total) by mouth every 6 (six) hours as needed for nausea or vomiting. 30 tablet 1   sertraline (ZOLOFT) 50 MG tablet Take 1 tablet (50 mg total) by mouth daily. 90 tablet 0   tamsulosin (FLOMAX) 0.4 MG CAPS capsule Take 1 capsule (0.4 mg total) by mouth daily. 30 capsule 3   No current facility-administered medications for this visit.   Facility-Administered Medications Ordered in Other Visits  Medication Dose Route Frequency Provider Last Rate Last Admin   CARBOplatin (PARAPLATIN) 710 mg in sodium chloride 0.9 % 250 mL chemo infusion  710 mg Intravenous  Once Jaye Polidori, Elisha Headland, MD       cemiplimab-rwlc (LIBTAYO) 350 mg in sodium chloride 0.9 % 100 mL chemo infusion  350 mg Intravenous Once Cammie Sickle, MD       cyanocobalamin ((VITAMIN B-12)) injection 1,000 mcg  1,000 mcg Intramuscular Once Cammie Sickle, MD       dexamethasone (DECADRON) 10 mg in sodium chloride 0.9 % 50 mL IVPB  10 mg Intravenous Once Charlaine Dalton R, MD       fosaprepitant (EMEND) 150 mg in sodium chloride 0.9 % 145 mL IVPB  150 mg Intravenous Once Cammie Sickle, MD  PEMEtrexed (ALIMTA) 1,200 mg in sodium chloride 0.9 % 100 mL chemo infusion  500 mg/m2 (Treatment Plan Recorded) Intravenous Once Cammie Sickle, MD          .  PHYSICAL EXAMINATION: ECOG PERFORMANCE STATUS: 1 - Symptomatic but completely ambulatory  Vitals:   12/13/21 0828  BP: 130/90  Pulse: 80  Temp: 97.7 F (36.5 C)  SpO2: 99%   Filed Weights   12/13/21 0828  Weight: 239 lb 6.4 oz (108.6 kg)    Physical Exam Vitals and nursing note reviewed.  HENT:     Head: Normocephalic and atraumatic.     Mouth/Throat:     Pharynx: Oropharynx is clear.  Eyes:     Extraocular Movements: Extraocular movements intact.     Pupils: Pupils are equal, round, and reactive to light.  Cardiovascular:     Rate and Rhythm: Normal rate and regular rhythm.  Pulmonary:     Comments: Decreased breath sounds bilaterally.  Abdominal:     Palpations: Abdomen is soft.  Musculoskeletal:        General: Normal range of motion.     Cervical back: Normal range of motion.  Skin:    General: Skin is warm.  Neurological:     General: No focal deficit present.     Mental Status: He is alert and oriented to person, place, and time.  Psychiatric:        Behavior: Behavior normal.        Judgment: Judgment normal.     LABORATORY DATA:  I have reviewed the data as listed Lab Results  Component Value Date   WBC 4.3 12/13/2021   HGB 15.2 12/13/2021   HCT 44.4  12/13/2021   MCV 89.3 12/13/2021   PLT 253 12/13/2021   Recent Labs    11/06/21 1258 11/13/21 1001 11/22/21 0804 11/29/21 1027 12/13/21 0802  NA 136   < > 138 135 138  K 3.9   < > 3.8 4.1 3.7  CL 103   < > 107 102 107  CO2 25   < > 22 24 23   GLUCOSE 126*   < > 111* 102* 121*  BUN 17   < > 18 19 17   CREATININE 0.98   < > 1.04 0.92 1.02  CALCIUM 8.7*   < > 9.1 9.1 9.2  GFRNONAA >60   < > >60 >60 >60  PROT 7.3  --  7.7  --  7.7  ALBUMIN 4.1  --  4.4  --  4.2  AST 20  --  21  --  26  ALT 25  --  28  --  35  ALKPHOS 76  --  74  --  81  BILITOT 0.7  --  0.7  --  0.4   < > = values in this interval not displayed.    RADIOGRAPHIC STUDIES: I have personally reviewed the radiological images as listed and agreed with the findings in the report. CT CHEST W CONTRAST  Result Date: 12/08/2021 CLINICAL DATA:  61 year old male with history of non-small cell carcinoma. EXAM: CT CHEST WITH CONTRAST TECHNIQUE: Multidetector CT imaging of the chest was performed during intravenous contrast administration. RADIATION DOSE REDUCTION: This exam was performed according to the departmental dose-optimization program which includes automated exposure control, adjustment of the mA and/or kV according to patient size and/or use of iterative reconstruction technique. CONTRAST:  28m OMNIPAQUE IOHEXOL 300 MG/ML  SOLN COMPARISON:  Chest CT 10/06/2021. FINDINGS: Cardiovascular: Heart size is  normal. There is no significant pericardial fluid, thickening or pericardial calcification. There is aortic atherosclerosis, as well as atherosclerosis of the great vessels of the mediastinum and the coronary arteries, including calcified atherosclerotic plaque in the left anterior descending, left circumflex and right coronary arteries. Mediastinum/Nodes: No pathologically enlarged mediastinal or hilar lymph nodes. Esophagus is unremarkable in appearance. No axillary lymphadenopathy. Lungs/Pleura: Previously noted right upper  lobe pulmonary nodule is similar to the prior examination (axial image 25 of series 3) measuring 2.5 x 1.5 cm (previously 2.3 x 1.5 cm). Inferior to the lesion there is a fiducial marker. No other suspicious appearing pulmonary nodules or masses are noted. No acute consolidative airspace disease. No pleural effusions. Mild chronic post infectious or inflammatory scarring in the inferior segment of the lingula is again noted. Upper Abdomen: Aortic atherosclerosis. Bilateral adrenal nodules, stable compared to the prior noncontrast CT examination, previously characterized as benign adenomas, largest of which is on the right side measuring 2.8 x 1.8 cm. Musculoskeletal: There are no aggressive appearing lytic or blastic lesions noted in the visualized portions of the skeleton. IMPRESSION: 1. Roughly stable size of aggressive appearing right upper lobe pulmonary nodule which was previously demonstrated to be hypermetabolic on prior PET-CT 05/39/7673, compatible with reported clinical history of primary bronchogenic neoplasm (likely adenocarcinoma). 2. No new pulmonary nodules or lymphadenopathy to suggest metastatic disease in the chest. 3. Aortic atherosclerosis, in addition to three-vessel coronary artery disease. Please note that although the presence of coronary artery calcium documents the presence of coronary artery disease, the severity of this disease and any potential stenosis cannot be assessed on this non-gated CT examination. Assessment for potential risk factor modification, dietary therapy or pharmacologic therapy may be warranted, if clinically indicated. 4. Bilateral adrenal adenomas again noted. Aortic Atherosclerosis (ICD10-I70.0). Electronically Signed   By: Vinnie Langton M.D.   On: 12/08/2021 11:04    ASSESSMENT & PLAN:   Primary adenocarcinoma of upper lobe of right lung ([place]) # Non-small cell lung cancer/stage IV-favor adeno carcinoma-metastases to right femur/liver; synchronous squamous  cell carcinoma scalp. Currently carbo-Alimta-Libtayo [cycle #2];FEB 9th,  2023-  CT Chest-  Roughly stable size of aggressive right upper lobe; no new pulmonary nodules or lymphadenopathy to suggest metastatic disease in the chest.  Stable liver lesion [discussed with radiology;] findings discussed at length with the patient and family.  # Proceed with cycle #3; Labs today reviewed;  acceptable for treatment today. Labs today reviewed;  acceptable for treatment today.   # SBRT of the right upper lobe lung lesion- s/p SBRT; Right proximal femoral lesion-symptomatic s/p radiation- STABLE; discussed with radiation oncology/[place].  Hold off on imaging at this time.  # Squamous cell carcinoma of the scalp-s/p excision; positive margins- on libtayo-no clinical evidence of progression.  Monitor for now.  Might need to consider timeline; based on course of about lung cancer.  # Anxiety-[Josh] on Zoloft/ zanax prn-STABLE>   # IV access: Wants to hold off the port placement at this time.  #Incidental findings on Imaging CT FEB, 2023: Coronary arteriosclerosis; bilateral adenoma; I reviewed/discussed/counseled the patient.   # DISPOSITION: # chemo today # 1 week-lab-cbc/bmp # follow up in 4 weeks- MD;labs- cbc/cmp- carboAlimta-Libtayo; - Dr.B  # I reviewed the blood work- with the patient in detail; also reviewed the imaging independently [as summarized above]; and with the patient in detail.        All questions were answered. The patient knows to call the clinic with any problems, questions or concerns.  Cammie Sickle, MD 12/13/2021 9:15 AM

## 2021-12-13 NOTE — Assessment & Plan Note (Addendum)
#   Non-small cell lung cancer/stage IV-favor adeno carcinoma-metastases to right femur/liver; synchronous squamous cell carcinoma scalp. Currently carbo-Alimta-Libtayo [cycle #2];FEB 9th,  2023-  CT Chest-  Roughly stable size of aggressive right upper lobe; no new pulmonary nodules or lymphadenopathy to suggest metastatic disease in the chest.  Stable liver lesion [discussed with radiology;] findings discussed at length with the patient and family.  # Proceed with cycle #3; Labs today reviewed;  acceptable for treatment today. Labs today reviewed;  acceptable for treatment today.   # SBRT of the right upper lobe lung lesion- s/p SBRT; Right proximal femoral lesion-symptomatic s/p radiation- STABLE; discussed with radiation oncology/[place].  Hold off on imaging at this time.  # Squamous cell carcinoma of the scalp-s/p excision; positive margins- on libtayo-no clinical evidence of progression.  Monitor for now.  Might need to consider timeline; based on course of about lung cancer.  # Anxiety-[Josh] on Zoloft/ zanax prn-STABLE>   # IV access: Wants to hold off the port placement at this time.  #Incidental findings on Imaging CT FEB, 2023: Coronary arteriosclerosis; bilateral adenoma; I reviewed/discussed/counseled the patient.   # DISPOSITION: # chemo today # 1 week-lab-cbc/bmp # follow up in 4 weeks- MD;labs- cbc/cmp- carboAlimta-Libtayo; - Dr.B  # I reviewed the blood work- with the patient in detail; also reviewed the imaging independently [as summarized above]; and with the patient in detail.

## 2021-12-14 ENCOUNTER — Encounter: Payer: Self-pay | Admitting: Internal Medicine

## 2021-12-14 NOTE — Progress Notes (Signed)
Met with patient and his family after follow up visit with Dr. Rogue Bussing. Pt did not have any needs or barriers identified at this time. Instructed to call with any questions or needs. Understanding verbalized.

## 2021-12-15 ENCOUNTER — Encounter: Payer: Self-pay | Admitting: *Deleted

## 2021-12-19 ENCOUNTER — Encounter: Payer: Self-pay | Admitting: *Deleted

## 2021-12-19 NOTE — Telephone Encounter (Signed)
Appt has been changed pt aware

## 2021-12-20 ENCOUNTER — Other Ambulatory Visit: Payer: Self-pay

## 2021-12-20 ENCOUNTER — Inpatient Hospital Stay: Payer: BC Managed Care – PPO

## 2021-12-20 ENCOUNTER — Encounter: Payer: Self-pay | Admitting: *Deleted

## 2021-12-20 ENCOUNTER — Other Ambulatory Visit: Payer: BC Managed Care – PPO

## 2021-12-20 DIAGNOSIS — Z79899 Other long term (current) drug therapy: Secondary | ICD-10-CM | POA: Diagnosis not present

## 2021-12-20 DIAGNOSIS — C787 Secondary malignant neoplasm of liver and intrahepatic bile duct: Secondary | ICD-10-CM | POA: Diagnosis not present

## 2021-12-20 DIAGNOSIS — C3411 Malignant neoplasm of upper lobe, right bronchus or lung: Secondary | ICD-10-CM

## 2021-12-20 DIAGNOSIS — F419 Anxiety disorder, unspecified: Secondary | ICD-10-CM | POA: Diagnosis not present

## 2021-12-20 DIAGNOSIS — R590 Localized enlarged lymph nodes: Secondary | ICD-10-CM | POA: Diagnosis not present

## 2021-12-20 DIAGNOSIS — I2510 Atherosclerotic heart disease of native coronary artery without angina pectoris: Secondary | ICD-10-CM | POA: Diagnosis not present

## 2021-12-20 DIAGNOSIS — C4442 Squamous cell carcinoma of skin of scalp and neck: Secondary | ICD-10-CM | POA: Diagnosis not present

## 2021-12-20 DIAGNOSIS — Z5111 Encounter for antineoplastic chemotherapy: Secondary | ICD-10-CM | POA: Diagnosis not present

## 2021-12-20 DIAGNOSIS — C7951 Secondary malignant neoplasm of bone: Secondary | ICD-10-CM | POA: Diagnosis not present

## 2021-12-20 LAB — CBC WITH DIFFERENTIAL/PLATELET
Abs Immature Granulocytes: 0.01 10*3/uL (ref 0.00–0.07)
Basophils Absolute: 0 10*3/uL (ref 0.0–0.1)
Basophils Relative: 0 %
Eosinophils Absolute: 0 10*3/uL (ref 0.0–0.5)
Eosinophils Relative: 0 %
HCT: 41 % (ref 39.0–52.0)
Hemoglobin: 14 g/dL (ref 13.0–17.0)
Immature Granulocytes: 0 %
Lymphocytes Relative: 36 %
Lymphs Abs: 1 10*3/uL (ref 0.7–4.0)
MCH: 30.6 pg (ref 26.0–34.0)
MCHC: 34.1 g/dL (ref 30.0–36.0)
MCV: 89.5 fL (ref 80.0–100.0)
Monocytes Absolute: 0.5 10*3/uL (ref 0.1–1.0)
Monocytes Relative: 16 %
Neutro Abs: 1.3 10*3/uL — ABNORMAL LOW (ref 1.7–7.7)
Neutrophils Relative %: 48 %
Platelets: 175 10*3/uL (ref 150–400)
RBC: 4.58 MIL/uL (ref 4.22–5.81)
RDW: 15.2 % (ref 11.5–15.5)
WBC: 2.9 10*3/uL — ABNORMAL LOW (ref 4.0–10.5)
nRBC: 0 % (ref 0.0–0.2)

## 2021-12-20 LAB — COMPREHENSIVE METABOLIC PANEL
ALT: 34 U/L (ref 0–44)
AST: 27 U/L (ref 15–41)
Albumin: 4.1 g/dL (ref 3.5–5.0)
Alkaline Phosphatase: 68 U/L (ref 38–126)
Anion gap: 12 (ref 5–15)
BUN: 22 mg/dL — ABNORMAL HIGH (ref 6–20)
CO2: 25 mmol/L (ref 22–32)
Calcium: 9.4 mg/dL (ref 8.9–10.3)
Chloride: 102 mmol/L (ref 98–111)
Creatinine, Ser: 0.93 mg/dL (ref 0.61–1.24)
GFR, Estimated: >60 mL/min (ref >60)
Glucose, Bld: 106 mg/dL — ABNORMAL HIGH (ref 70–99)
Potassium: 3.8 mmol/L (ref 3.5–5.1)
Sodium: 139 mmol/L (ref 135–145)
Total Bilirubin: 0.6 mg/dL (ref 0.3–1.2)
Total Protein: 7.2 g/dL (ref 6.5–8.1)

## 2021-12-27 ENCOUNTER — Other Ambulatory Visit: Payer: BC Managed Care – PPO

## 2021-12-28 ENCOUNTER — Encounter: Payer: Self-pay | Admitting: Licensed Clinical Social Worker

## 2021-12-28 NOTE — Progress Notes (Signed)
[place] ?Clinical Social Work ? ?Clinical Social Work was referred by [facility] CSW Progress Note ? ?Clinical Social Worker contacted caregiver by phone to follow-up on concerns and needs discussed.  CSW left voicemail with contact information and request for return call. ? ? ?Sejla Marzano, LCSW  ?Clinical Social Worker ?[place] ?      ?Second Attempt ?

## 2022-01-03 ENCOUNTER — Ambulatory Visit: Payer: BC Managed Care – PPO

## 2022-01-03 ENCOUNTER — Ambulatory Visit: Payer: BC Managed Care – PPO | Admitting: Physician Assistant

## 2022-01-03 ENCOUNTER — Other Ambulatory Visit: Payer: BC Managed Care – PPO

## 2022-01-10 ENCOUNTER — Inpatient Hospital Stay: Payer: BC Managed Care – PPO | Attending: Internal Medicine

## 2022-01-10 ENCOUNTER — Other Ambulatory Visit: Payer: Self-pay

## 2022-01-10 ENCOUNTER — Other Ambulatory Visit: Payer: Self-pay | Admitting: Cardiovascular Disease

## 2022-01-10 ENCOUNTER — Inpatient Hospital Stay: Payer: BC Managed Care – PPO

## 2022-01-10 ENCOUNTER — Inpatient Hospital Stay (HOSPITAL_BASED_OUTPATIENT_CLINIC_OR_DEPARTMENT_OTHER): Payer: BC Managed Care – PPO | Admitting: Internal Medicine

## 2022-01-10 ENCOUNTER — Encounter: Payer: Self-pay | Admitting: Internal Medicine

## 2022-01-10 ENCOUNTER — Inpatient Hospital Stay (HOSPITAL_BASED_OUTPATIENT_CLINIC_OR_DEPARTMENT_OTHER): Payer: BC Managed Care – PPO | Admitting: Hospice and Palliative Medicine

## 2022-01-10 DIAGNOSIS — Z5111 Encounter for antineoplastic chemotherapy: Secondary | ICD-10-CM | POA: Diagnosis not present

## 2022-01-10 DIAGNOSIS — C792 Secondary malignant neoplasm of skin: Secondary | ICD-10-CM | POA: Insufficient documentation

## 2022-01-10 DIAGNOSIS — C3411 Malignant neoplasm of upper lobe, right bronchus or lung: Secondary | ICD-10-CM | POA: Diagnosis not present

## 2022-01-10 DIAGNOSIS — F419 Anxiety disorder, unspecified: Secondary | ICD-10-CM | POA: Diagnosis not present

## 2022-01-10 DIAGNOSIS — Z79899 Other long term (current) drug therapy: Secondary | ICD-10-CM | POA: Insufficient documentation

## 2022-01-10 DIAGNOSIS — C7951 Secondary malignant neoplasm of bone: Secondary | ICD-10-CM | POA: Insufficient documentation

## 2022-01-10 LAB — CBC WITH DIFFERENTIAL/PLATELET
Abs Immature Granulocytes: 0.02 10*3/uL (ref 0.00–0.07)
Basophils Absolute: 0 10*3/uL (ref 0.0–0.1)
Basophils Relative: 0 %
Eosinophils Absolute: 0 10*3/uL (ref 0.0–0.5)
Eosinophils Relative: 0 %
HCT: 41.8 % (ref 39.0–52.0)
Hemoglobin: 14.4 g/dL (ref 13.0–17.0)
Immature Granulocytes: 0 %
Lymphocytes Relative: 18 %
Lymphs Abs: 0.9 10*3/uL (ref 0.7–4.0)
MCH: 32.1 pg (ref 26.0–34.0)
MCHC: 34.4 g/dL (ref 30.0–36.0)
MCV: 93.1 fL (ref 80.0–100.0)
Monocytes Absolute: 0.9 10*3/uL (ref 0.1–1.0)
Monocytes Relative: 17 %
Neutro Abs: 3.2 10*3/uL (ref 1.7–7.7)
Neutrophils Relative %: 65 %
Platelets: 208 10*3/uL (ref 150–400)
RBC: 4.49 MIL/uL (ref 4.22–5.81)
RDW: 18.3 % — ABNORMAL HIGH (ref 11.5–15.5)
WBC: 5.1 10*3/uL (ref 4.0–10.5)
nRBC: 0 % (ref 0.0–0.2)

## 2022-01-10 LAB — COMPREHENSIVE METABOLIC PANEL
ALT: 35 U/L (ref 0–44)
AST: 29 U/L (ref 15–41)
Albumin: 4.1 g/dL (ref 3.5–5.0)
Alkaline Phosphatase: 74 U/L (ref 38–126)
Anion gap: 6 (ref 5–15)
BUN: 15 mg/dL (ref 6–20)
CO2: 24 mmol/L (ref 22–32)
Calcium: 8.9 mg/dL (ref 8.9–10.3)
Chloride: 106 mmol/L (ref 98–111)
Creatinine, Ser: 1 mg/dL (ref 0.61–1.24)
GFR, Estimated: >60 mL/min (ref >60)
Glucose, Bld: 116 mg/dL — ABNORMAL HIGH (ref 70–99)
Potassium: 3.8 mmol/L (ref 3.5–5.1)
Sodium: 136 mmol/L (ref 135–145)
Total Bilirubin: 0.3 mg/dL (ref 0.3–1.2)
Total Protein: 7.4 g/dL (ref 6.5–8.1)

## 2022-01-10 MED ORDER — PALONOSETRON HCL INJECTION 0.25 MG/5ML
0.2500 mg | Freq: Once | INTRAVENOUS | Status: AC
  Administered 2022-01-10: 0.25 mg via INTRAVENOUS
  Filled 2022-01-10: qty 5

## 2022-01-10 MED ORDER — SODIUM CHLORIDE 0.9 % IV SOLN
350.0000 mg | Freq: Once | INTRAVENOUS | Status: AC
  Administered 2022-01-10: 350 mg via INTRAVENOUS
  Filled 2022-01-10: qty 7

## 2022-01-10 MED ORDER — SODIUM CHLORIDE 0.9 % IV SOLN
736.0000 mg | Freq: Once | INTRAVENOUS | Status: AC
  Administered 2022-01-10: 740 mg via INTRAVENOUS
  Filled 2022-01-10: qty 74

## 2022-01-10 MED ORDER — DEXAMETHASONE SODIUM PHOSPHATE 10 MG/ML IJ SOLN
10.0000 mg | Freq: Once | INTRAMUSCULAR | Status: AC
  Administered 2022-01-10: 10 mg via INTRAVENOUS
  Filled 2022-01-10: qty 1

## 2022-01-10 MED ORDER — SODIUM CHLORIDE 0.9 % IV SOLN
Freq: Once | INTRAVENOUS | Status: AC
  Filled 2022-01-10: qty 250

## 2022-01-10 MED ORDER — SODIUM CHLORIDE 0.9 % IV SOLN
10.0000 mg | Freq: Once | INTRAVENOUS | Status: DC
  Filled 2022-01-10: qty 1

## 2022-01-10 MED ORDER — CYANOCOBALAMIN 1000 MCG/ML IJ SOLN
1000.0000 ug | Freq: Once | INTRAMUSCULAR | Status: AC
  Administered 2022-01-10: 1000 ug via INTRAMUSCULAR
  Filled 2022-01-10: qty 1

## 2022-01-10 MED ORDER — SODIUM CHLORIDE 0.9 % IV SOLN
150.0000 mg | Freq: Once | INTRAVENOUS | Status: AC
  Administered 2022-01-10: 150 mg via INTRAVENOUS
  Filled 2022-01-10: qty 150

## 2022-01-10 MED ORDER — SODIUM CHLORIDE 0.9 % IV SOLN
500.0000 mg/m2 | Freq: Once | INTRAVENOUS | Status: AC
  Administered 2022-01-10: 1200 mg via INTRAVENOUS
  Filled 2022-01-10: qty 40

## 2022-01-10 NOTE — Progress Notes (Signed)
? ?  ?Palliative Medicine ?[place] at [facility] ?[phone] [phone]) [phone] ? ? ?Name: Chris [name] ?Date: 01/10/2022 ?MRN: [id number]  ?DOB: 1960-12-27 ? ?Patient Care Team: ?Casilda Carls, MD as PCP - General (Internal Medicine) ?Lorretta Harp, MD as PCP - Cardiology (Cardiology) ?Curt Bears, MD as Consulting Physician (Oncology) ?Casilda Carls, MD as Referring Physician (Internal Medicine) ?Cammie Sickle, MD as Consulting Physician (Internal Medicine) ?Cammie Sickle, MD as Consulting Physician (Internal Medicine)  ? ? ?REASON FOR CONSULTATION: ?Chris [name] is a 61 y.o. male with multiple medical problems including stage IV non-small cell lung cancer with metastasis to femur/liver diagnosed in December 2022.  Patient is currently on treatment with carbo/Alimta/Libtayo but has had poor tolerance to treatment.  He is referred to radiation oncology for SBRT of right upper lobe lung lesion and right proximal femoral lesion.  Patient has had severe anxiety and was referred to palliative care to help address goals and manage ongoing symptoms. ? ?SOCIAL HISTORY:    ? reports that he has been smoking cigarettes. He has a 63.00 pack-year smoking history. He has never used smokeless tobacco. He reports that he does not drink alcohol and does not use drugs. ? ?Patient is married and lives at home with his wife.  They have a son and recently had grandchildren.  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 ? ?ADVANCE DIRECTIVES:  ?Not on file ? ?CODE STATUS:  ? ?PAST MEDICAL HISTORY: ?Past Medical History:  ?Diagnosis Date  ? Anxiety   ? associated with medical care,  worsened by difficulty hearing, does better with wife present  ? Complication of anesthesia   ? wife states very anxious may need pre sedation  ? Coronary artery disease   ? GERD (gastroesophageal reflux disease)   ? Hearing loss   ? mild per wife  ? Hearing loss   ? no hearing aids per wife  ?  Hyperlipidemia   ? Hypertension   ? MI (myocardial infarction) ([place]) 04/17/2006  ? Acute inferolateral wall MI with cardiogenic shock and complete heart block  ? Primary adenocarcinoma of upper lobe of right lung ([place])   ? Prostate cancer ([place])   ? Tobacco abuse   ? Wears glasses   ? Wears partial dentures   ? Upper  ? ? ?PAST SURGICAL HISTORY:  ?Past Surgical History:  ?Procedure Laterality Date  ? BRONCHIAL BIOPSY  10/09/2021  ? Procedure: BRONCHIAL BIOPSIES;  Surgeon: Collene Gobble, MD;  Location: [facility] ENDOSCOPY;  Service: Pulmonary;;  ? BRONCHIAL BRUSHINGS  10/09/2021  ? Procedure: BRONCHIAL BRUSHINGS;  Surgeon: Collene Gobble, MD;  Location: [facility] ENDOSCOPY;  Service: Pulmonary;;  ? BRONCHIAL NEEDLE ASPIRATION BIOPSY  10/09/2021  ? Procedure: BRONCHIAL NEEDLE ASPIRATION BIOPSIES;  Surgeon: Collene Gobble, MD;  Location: [facility] ENDOSCOPY;  Service: Pulmonary;;  ? CARDIAC CATHETERIZATION  2007  ? left, RCA 100% occluded ruptured plaque with thrombus in the proximal segment  ? CYST EXCISION N/A 08/30/2021  ? Procedure: EXCISION OF POSTERIOR SCALP CYST;  Surgeon: Clovis Riley, MD;  Location: [facility] ORS;  Service: General;  Laterality: N/A;  ? CYSTOSCOPY N/A 07/17/2018  ? Procedure: CYSTOSCOPY;  Surgeon: Franchot Gallo, MD;  Location: [facility];  Service: Urology;  Laterality: N/A;  no seeds in bladder per Dr Diona Fanti  ? FIDUCIAL MARKER PLACEMENT  10/09/2021  ? Procedure: FIDUCIAL MARKER PLACEMENT;  Surgeon: Collene Gobble, MD;  Location: [facility] ENDOSCOPY;  Service: Pulmonary;;  ? HAND SURGERY Right   ?  has metal plate in arm  ? PROSTATE BIOPSY    ? RADIOACTIVE SEED IMPLANT N/A 07/17/2018  ? Procedure: RADIOACTIVE SEED IMPLANT/BRACHYTHERAPY IMPLANT;  Surgeon: Franchot Gallo, MD;  Location: [facility];  Service: Urology;  Laterality: N/A;  77 seeds  ? SPACE OAR INSTILLATION N/A 07/17/2018  ? Procedure: SPACE OAR INSTILLATION;  Surgeon: Franchot Gallo, MD;  Location: [facility];  Service: Urology;  Laterality: N/A;  ? VIDEO BRONCHOSCOPY WITH RADIAL ENDOBRONCHIAL ULTRASOUND  10/09/2021  ? Procedure: VIDEO BRONCHOSCOPY WITH RADIAL ENDOBRONCHIAL ULTRASOUND;  Surgeon: Collene Gobble, MD;  Location: [facility] ENDOSCOPY;  Service: Pulmonary;;  ? WRIST SURGERY  10/04/2012  ? ? ?HEMATOLOGY/ONCOLOGY HISTORY:  ?Oncology History Overview Note  ?DIAGNOSIS: ?1) stage IV (T1c, N0, M1 C) non-small cell lung cancer favoring adenocarcinoma presented with right lung apical nodule in addition to metastatic disease in the right hepatic lobe and the proximal right femoral diaphysis diagnosed and December 2022. 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 ? ?QNS; Guardant 360 showed positive KRAS G12C mutation ? ?FINAL MICROSCOPIC DIAGNOSIS:  ? ?A. LUNG, RUL, FINE NEEDLE ASPIRATION:  ?- Malignant cells consistent with non-small cell carcinoma, see comment  ? ?B. LUNG, RUL, BRUSHING:  ?- Malignant cells consistent with non-small cell carcinoma, see comment  ? ? ? ? ? ?COMMENT:  ? ?A and B.  Dr. Saralyn Pilar reviewed the case and concurs with the diagnosis.  ?Only rare malignant cells are present on the cellblock.  ?Immunohistochemical stains were attempted and show that the tumor cells  ?have patchy staining for TTF-1 whereas p63, p40 and CK5/6 are negative.  ?The findings are nondiagnostic but suggestive of a lung adenocarcinoma.  ?Dr. Lamonte Sakai was notified on 10/13/2021.  ? ?SEP-OCT 2022-  [Dermatology]-   Poorly differentiated squamous cell carcinoma; -  Carcinoma extends to the edges of the excision specimen  ? ?IMPRESSION: ?1. The spiculated nodule at the right lung apex on recent neck CT is ?hypermetabolic and is concerning for primary bronchogenic carcinoma. ?Tissue sampling recommended. ?2. Hypermetabolic activity within the occipital scalp and small ?previously demonstrated right occipital lymph node compatible  with ?known squamous cell carcinoma. Evaluation of the head and neck ?limited by motion artifact. ?3. Hypermetabolic lesions inferiorly in the right hepatic lobe and ?in the proximal right femoral diaphysis suspicious for metastatic ?disease, primary uncertain in this patient with a history of ?prostate cancer. Correlate with PSA levels. Abdominal MRI without ?and with contrast may be helpful for further characterization of the ?liver lesion. ? ?# 2007MI-CAD [s/p stent-Dr.Berry; EF 2021- 58%] ?  ?  ?Primary adenocarcinoma of upper lobe of right lung ([place])  ?10/18/2021 Initial Diagnosis  ? Primary adenocarcinoma of upper lobe of right lung [facility]) ?  ?10/18/2021 Cancer Staging  ? Staging form: Lung, AJCC 8th Edition ?- Clinical: Stage IVB (cT1c, cN0, cM1c) - Signed by Curt Bears, MD on 10/18/2021 ?  ?11/01/2021 -  Chemotherapy  ? Patient is on Treatment Plan : LUNG NSCLC Pemetrexed + Carboplatin q21d x 4 Cycles  ?   ? ? ?ALLERGIES:  has No Known Allergies. ? ?MEDICATIONS:  ?Current Outpatient Medications  ?Medication Sig Dispense Refill  ? ALPRAZolam (XANAX) 0.25 MG tablet Take 1 tablet (0.25 mg total) by mouth at bedtime. (Patient not taking: Reported on 01/10/2022) 15 tablet 0  ? atorvastatin (LIPITOR) 20 MG tablet TAKE 1 TABLET BY MOUTH EVERY DAY 90 tablet 3  ? clopidogrel (PLAVIX)  75 MG tablet Take 1 tablet (75 mg total) by mouth daily. TAKE 1 TABLET (75 MG TOTAL) BY MOUTH DAILY. ?Okay to restart this medication on 10/10/2021. 90 tablet 3  ? folic acid (FOLVITE) 1 MG tablet Take 1 tablet (1 mg total) by mouth daily. 30 tablet 4  ? ketoconazole (NIZORAL) 2 % cream Apply 1 application topically 2 (two) times daily as needed for irritation.    ? metoprolol tartrate (LOPRESSOR) 25 MG tablet Take 1 tablet (25 mg total) by mouth daily. 30 tablet 11  ? omeprazole (PRILOSEC OTC) 20 MG tablet Take 20 mg by mouth daily.    ? prochlorperazine (COMPAZINE) 10 MG tablet Take 1 tablet (10 mg total) by mouth every 6 (six) hours  as needed for nausea or vomiting. (Patient not taking: Reported on 01/10/2022) 30 tablet 1  ? sertraline (ZOLOFT) 50 MG tablet Take 1 tablet (50 mg total) by mouth daily. 90 tablet 0  ? tamsulosin (FLOMAX)

## 2022-01-10 NOTE — Progress Notes (Signed)
[place] ?CONSULT NOTE ? ?Patient Care Team: ?Casilda Carls, MD as PCP - General (Internal Medicine) ?Lorretta Harp, MD as PCP - Cardiology (Cardiology) ?Curt Bears, MD as Consulting Physician (Oncology) ?Casilda Carls, MD as Referring Physician (Internal Medicine) ?Cammie Sickle, MD as Consulting Physician (Internal Medicine) ?Cammie Sickle, MD as Consulting Physician (Internal Medicine) ? ?CHIEF COMPLAINTS/PURPOSE OF CONSULTATION: lung cancer ? ?#  ?Oncology History Overview Note  ?DIAGNOSIS: ?1) stage IV (T1c, N0, M1 C) non-small cell lung cancer favoring adenocarcinoma presented with right lung apical nodule in addition to metastatic disease in the right hepatic lobe and the proximal right femoral diaphysis diagnosed and December 2022. 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 ? ?QNS; Guardant 360 showed positive KRAS G12C mutation ? ?FINAL MICROSCOPIC DIAGNOSIS:  ? ?A. LUNG, RUL, FINE NEEDLE ASPIRATION:  ?- Malignant cells consistent with non-small cell carcinoma, see comment  ? ?B. LUNG, RUL, BRUSHING:  ?- Malignant cells consistent with non-small cell carcinoma, see comment  ? ? ? ? ? ?COMMENT:  ? ?A and B.  Dr. Saralyn Pilar reviewed the case and concurs with the diagnosis.  ?Only rare malignant cells are present on the cellblock.  ?Immunohistochemical stains were attempted and show that the tumor cells  ?have patchy staining for TTF-1 whereas p63, p40 and CK5/6 are negative.  ?The findings are nondiagnostic but suggestive of a lung adenocarcinoma.  ?Dr. Lamonte Sakai was notified on 10/13/2021.  ? ?SEP-OCT 2022-  [Dermatology]-   Poorly differentiated squamous cell carcinoma; -  Carcinoma extends to the edges of the excision specimen  ? ?IMPRESSION: ?1. The spiculated nodule at the right lung apex on recent neck CT is ?hypermetabolic and is concerning for primary bronchogenic  carcinoma. ?Tissue sampling recommended. ?2. Hypermetabolic activity within the occipital scalp and small ?previously demonstrated right occipital lymph node compatible with ?known squamous cell carcinoma. Evaluation of the head and neck ?limited by motion artifact. ?3. Hypermetabolic lesions inferiorly in the right hepatic lobe and ?in the proximal right femoral diaphysis suspicious for metastatic ?disease, primary uncertain in this patient with a history of ?prostate cancer. Correlate with PSA levels. Abdominal MRI without ?and with contrast may be helpful for further characterization of the ?liver lesion. ? ?# 2007MI-CAD [s/p stent-Dr.Berry; EF 2021- 58%] ?  ?  ?Primary adenocarcinoma of upper lobe of right lung ([place])  ?10/18/2021 Initial Diagnosis  ? Primary adenocarcinoma of upper lobe of right lung [facility]) ?  ?10/18/2021 Cancer Staging  ? Staging form: Lung, AJCC 8th Edition ?- Clinical: Stage IVB (cT1c, cN0, cM1c) - Signed by Curt Bears, MD on 10/18/2021 ? ?  ?11/01/2021 -  Chemotherapy  ? Patient is on Treatment Plan : LUNG NSCLC Pemetrexed + Carboplatin q21d x 4 Cycles  ?   ? ? ? ?HISTORY OF PRESENTING ILLNESS: Ambulating independently.  Accompanied by his wife.  ? ?Chris [name] 61 y.o.  male metastatic stage IV non-small cell lung cancer/favor adenocarcinoma-currently on chemoimmunotherapy here for follow-up. ? ?Complains of fatigue. ? Skin lesion on right dorsal hand.  Attributes to IV line placement.  Patient admits to mild to moderate fatigue few days after chemotherapy.  However denies any nausea vomiting diarrhea.  ? ? ?Review of Systems  ?Constitutional:  Positive for malaise/fatigue. Negative for chills, diaphoresis, fever and weight loss.  ?HENT:  Positive for hearing loss. Negative for nosebleeds and sore throat.   ?Eyes:  Negative for double vision.  ?  Respiratory:  Negative for cough, hemoptysis, sputum production, shortness of breath and wheezing.   ?Cardiovascular:  Negative for chest  pain, palpitations, orthopnea and leg swelling.  ?Gastrointestinal:  Negative for abdominal pain, blood in stool, diarrhea, heartburn, melena, nausea and vomiting.  ?Genitourinary:  Negative for dysuria, frequency and urgency.  ?Musculoskeletal:  Negative for back pain.  ?Skin: Negative.  Negative for itching and rash.  ?Neurological:  Negative for dizziness, tingling, focal weakness, weakness and headaches.  ?Endo/Heme/Allergies:  Does not bruise/bleed easily.  ?Psychiatric/Behavioral:  Negative for depression. The patient does not have insomnia.    ? ?MEDICAL HISTORY:  ?Past Medical History:  ?Diagnosis Date  ? Anxiety   ? associated with medical care,  worsened by difficulty hearing, does better with wife present  ? Complication of anesthesia   ? wife states very anxious may need pre sedation  ? Coronary artery disease   ? GERD (gastroesophageal reflux disease)   ? Hearing loss   ? mild per wife  ? Hearing loss   ? no hearing aids per wife  ? Hyperlipidemia   ? Hypertension   ? MI (myocardial infarction) ([place]) 04/17/2006  ? Acute inferolateral wall MI with cardiogenic shock and complete heart block  ? Primary adenocarcinoma of upper lobe of right lung ([place])   ? Prostate cancer ([place])   ? Tobacco abuse   ? Wears glasses   ? Wears partial dentures   ? Upper  ? ? ?SURGICAL HISTORY: ?Past Surgical History:  ?Procedure Laterality Date  ? BRONCHIAL BIOPSY  10/09/2021  ? Procedure: BRONCHIAL BIOPSIES;  Surgeon: Collene Gobble, MD;  Location: [facility] ENDOSCOPY;  Service: Pulmonary;;  ? BRONCHIAL BRUSHINGS  10/09/2021  ? Procedure: BRONCHIAL BRUSHINGS;  Surgeon: Collene Gobble, MD;  Location: [facility] ENDOSCOPY;  Service: Pulmonary;;  ? BRONCHIAL NEEDLE ASPIRATION BIOPSY  10/09/2021  ? Procedure: BRONCHIAL NEEDLE ASPIRATION BIOPSIES;  Surgeon: Collene Gobble, MD;  Location: [facility] ENDOSCOPY;  Service: Pulmonary;;  ? CARDIAC CATHETERIZATION  2007  ? left, RCA 100% occluded ruptured plaque with thrombus in the proximal segment  ? CYST  EXCISION N/A 08/30/2021  ? Procedure: EXCISION OF POSTERIOR SCALP CYST;  Surgeon: Clovis Riley, MD;  Location: [facility] ORS;  Service: General;  Laterality: N/A;  ? CYSTOSCOPY N/A 07/17/2018  ? Procedure: CYSTOSCOPY;  Surgeon: Franchot Gallo, MD;  Location: [facility];  Service: Urology;  Laterality: N/A;  no seeds in bladder per Dr Diona Fanti  ? FIDUCIAL MARKER PLACEMENT  10/09/2021  ? Procedure: FIDUCIAL MARKER PLACEMENT;  Surgeon: Collene Gobble, MD;  Location: [facility] ENDOSCOPY;  Service: Pulmonary;;  ? HAND SURGERY Right   ? has metal plate in arm  ? PROSTATE BIOPSY    ? RADIOACTIVE SEED IMPLANT N/A 07/17/2018  ? Procedure: RADIOACTIVE SEED IMPLANT/BRACHYTHERAPY IMPLANT;  Surgeon: Franchot Gallo, MD;  Location: [facility];  Service: Urology;  Laterality: N/A;  77 seeds  ? SPACE OAR INSTILLATION N/A 07/17/2018  ? Procedure: SPACE OAR INSTILLATION;  Surgeon: Franchot Gallo, MD;  Location: [facility];  Service: Urology;  Laterality: N/A;  ? VIDEO BRONCHOSCOPY WITH RADIAL ENDOBRONCHIAL ULTRASOUND  10/09/2021  ? Procedure: VIDEO BRONCHOSCOPY WITH RADIAL ENDOBRONCHIAL ULTRASOUND;  Surgeon: Collene Gobble, MD;  Location: [facility] ENDOSCOPY;  Service: Pulmonary;;  ? WRIST SURGERY  10/04/2012  ? ? ?SOCIAL HISTORY: ?Social History  ? ?Socioeconomic History  ? Marital status: Married  ?  Spouse name: Not on file  ? Number of children: Not on file  ?  Years of education: Not on file  ? Highest education level: Not on file  ?Occupational History  ? Not on file  ?Tobacco Use  ? Smoking status: Every Day  ?  Packs/day: 1.50  ?  Years: 42.00  ?  Pack years: 63.00  ?  Types: Cigarettes  ? Smokeless tobacco: Never  ?Vaping Use  ? Vaping Use: Never used  ?Substance and Sexual Activity  ? Alcohol use: No  ? Drug use: No  ? Sexual activity: Yes  ?  Birth control/protection: None  ?Other Topics Concern  ? Not on file  ?Social History Narrative  ? 10-04 19 Unable to ask abuse questions  wife with him today.  ? ?Social Determinants of Health  ? ?Financial Resource Strain: Not on file  ?Food Insecurity: Not on file  ?Transportation Needs: Not on file  ?Physical Activity: Not on file  ?Stress: Not on

## 2022-01-10 NOTE — Patient Instructions (Signed)
[facility] CANCER CTR AT [place]  Discharge Instructions: ?Thank you for choosing [facility] to provide your oncology and hematology care.  ?If you have a lab appointment with the [facility], please go directly to the [facility] and check in at the registration area. ? ?Wear comfortable clothing and clothing appropriate for easy access to any Portacath or PICC line.  ? ?We strive to give you quality time with your provider. You may need to reschedule your appointment if you arrive late (15 or more minutes).  Arriving late affects you and other patients whose appointments are after yours.  Also, if you miss three or more appointments without notifying the office, you may be dismissed from the clinic at the provider?s discretion.    ?  ?For prescription refill requests, have your pharmacy contact our office and allow 72 hours for refills to be completed.   ? ?Today you received the following chemotherapy and/or immunotherapy agents - Alimta, Libtayo, carboplatin    ?  ?To help prevent nausea and vomiting after your treatment, we encourage you to take your nausea medication as directed. ? ?BELOW ARE SYMPTOMS THAT SHOULD BE REPORTED IMMEDIATELY: ?*FEVER GREATER THAN 100.4 F (38 ?C) OR HIGHER ?*CHILLS OR SWEATING ?*NAUSEA AND VOMITING THAT IS NOT CONTROLLED WITH YOUR NAUSEA MEDICATION ?*UNUSUAL SHORTNESS OF BREATH ?*UNUSUAL BRUISING OR BLEEDING ?*URINARY PROBLEMS (pain or burning when urinating, or frequent urination) ?*BOWEL PROBLEMS (unusual diarrhea, constipation, pain near the anus) ?TENDERNESS IN MOUTH AND THROAT WITH OR WITHOUT PRESENCE OF ULCERS (sore throat, sores in mouth, or a toothache) ?UNUSUAL RASH, SWELLING OR PAIN  ?UNUSUAL VAGINAL DISCHARGE OR ITCHING  ? ?Items with * indicate a potential emergency and should be followed up as soon as possible or go to the Emergency Department if any problems should occur. ? ?Please show the CHEMOTHERAPY ALERT CARD or IMMUNOTHERAPY ALERT  CARD at check-in to the Emergency Department and triage nurse. ? ?Should you have questions after your visit or need to cancel or reschedule your appointment, please contact [facility]CER [place] AT [place]  [phone] and follow the prompts.  Office hours are 8:00 a.m. to 4:30 p.m. Monday - Friday. Please note that voicemails left after 4:00 p.m. may not be returned until the following business day.  We are closed weekends and major holidays. You have access to a nurse at all times for urgent questions. Please call the main number to the clinic [phone] and follow the prompts. ? ?For any non-urgent questions, you may also contact your provider using MyChart. We now offer e-Visits for anyone 53 and older to request care online for non-urgent symptoms. For details visit [email or web address]. ?  ?Also download the MyChart app! Go to the app store, search "MyChart", open the app, select [facility], and log in with your MyChart username and password. ? ?Due to Covid, a mask is required upon entering the hospital/clinic. If you do not have a mask, one will be given to you upon arrival. For doctor visits, patients may have 1 support person aged 87 or older with them. For treatment visits, patients cannot have anyone with them due to current Covid guidelines and our immunocompromised population.  ? ?Pemetrexed injection ?What is this medication? ?PEMETREXED (PEM e TREX ed) is a chemotherapy drug used to treat lung cancers like non-small cell lung cancer and mesothelioma. It may also be used to treat other cancers. ?This medicine may be used for other purposes; ask your health care provider or pharmacist if  you have questions. ?COMMON BRAND NAME(S): Alimta, PEMFEXY ?What should I tell my care team before I take this medication? ?They need to know if you have any of these conditions: ?infection (especially a virus infection such as chickenpox, cold sores, or herpes) ?kidney disease ?low blood counts,  like low white cell, platelet, or red cell counts ?lung or breathing disease, like asthma ?radiation therapy ?an unusual or allergic reaction to pemetrexed, other medicines, foods, dyes, or preservative ?pregnant or trying to get pregnant ?breast-feeding ?How should I use this medication? ?This drug is given as an infusion into a vein. It is administered in a hospital or clinic by a specially trained health care professional. ?Talk to your pediatrician regarding the use of this medicine in children. Special care may be needed. ?Overdosage: If you think you have taken too much of this medicine contact a poison control center or emergency room at once. ?NOTE: This medicine is only for you. Do not share this medicine with others. ?What if I miss a dose? ?It is important not to miss your dose. Call your doctor or health care professional if you are unable to keep an appointment. ?What may interact with this medication? ?This medicine may interact with the following medications: ?Ibuprofen ?This list may not describe all possible interactions. Give your health care provider a list of all the medicines, herbs, non-prescription drugs, or dietary supplements you use. Also tell them if you smoke, drink alcohol, or use illegal drugs. Some items may interact with your medicine. ?What should I watch for while using this medication? ?Visit your doctor for checks on your progress. This drug may make you feel generally unwell. This is not uncommon, as chemotherapy can affect healthy cells as well as cancer cells. Report any side effects. Continue your course of treatment even though you feel ill unless your doctor tells you to stop. ?In some cases, you may be given additional medicines to help with side effects. Follow all directions for their use. ?Call your doctor or health care professional for advice if you get a fever, chills or sore throat, or other symptoms of a cold or flu. Do not treat yourself. This drug decreases your  body's ability to fight infections. Try to avoid being around people who are sick. ?This medicine may increase your risk to bruise or bleed. Call your doctor or health care professional if you notice any unusual bleeding. ?Be careful brushing and flossing your teeth or using a toothpick because you may get an infection or bleed more easily. If you have any dental work done, tell your dentist you are receiving this medicine. ?Avoid taking products that contain aspirin, acetaminophen, ibuprofen, naproxen, or ketoprofen unless instructed by your doctor. These medicines may hide a fever. ?Call your doctor or health care professional if you get diarrhea or mouth sores. Do not treat yourself. ?To protect your kidneys, drink water or other fluids as directed while you are taking this medicine. ?Do not become pregnant while taking this medicine or for 6 months after stopping it. Women should inform their doctor if they wish to become pregnant or think they might be pregnant. Men should not father a child while taking this medicine and for 3 months after stopping it. This may interfere with the ability to father a child. You should talk to your doctor or health care professional if you are concerned about your fertility. There is a potential for serious side effects to an unborn child. Talk to your health care professional  or pharmacist for more information. Do not breast-feed an infant while taking this medicine or for 1 week after stopping it. ?What side effects may I notice from receiving this medication? ?Side effects that you should report to your doctor or health care professional as soon as possible: ?allergic reactions like skin rash, itching or hives, swelling of the face, lips, or tongue ?breathing problems ?redness, blistering, peeling or loosening of the skin, including inside the mouth ?signs and symptoms of bleeding such as bloody or black, tarry stools; red or dark-brown urine; spitting up blood or brown  material that looks like coffee grounds; red spots on the skin; unusual bruising or bleeding from the eye, gums, or nose ?signs and symptoms of infection like fever or chills; cough; sore throat; pain or trou

## 2022-01-10 NOTE — Assessment & Plan Note (Addendum)
#   Non-small cell lung cancer/stage IV-favor adeno carcinoma-metastases to right femur/liver; synchronous squamous cell carcinoma scalp. Currently carbo-Alimta-Libtayo [cycle #2];FEB 9th,  2023-  CT Chest-  Roughly stable size of aggressive right upper lobe; no new pulmonary nodules or lymphadenopathy to suggest metastatic disease in the chest.   ? ?# Proceed with cycle #4; Labs today reviewed;  acceptable for treatment today. Labs today reviewed;  acceptable for treatment today. Will order scans at next visit.  Proceed with immunotherapy alone next [place]. ? ?# SBRT of the right upper lobe lung lesion- s/p SBRT; Right proximal femoral lesion-symptomatic s/p radiation-  discussed with radiation oncology/[facility].  Hold off on imaging at this time.STABLE; ? 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  Might need to consider radiation-if any local progression noted/also placed on course lung cancer.  ? ?# Anxiety-[Josh] on Zoloft/ zanax prn-STABLE>  ? ?# RIght dorsum skin lesion-given history of skin cancer defer to dermatologist.  ? ?# IV access: Wants to hold off the port placement at this time. ? ?# DISPOSITION: ?# chemo today; also b-12 injection ?# 1 week-lab-cbc/bmp ?# follow up in 3 weeks-X- MD;labs- cbc/cmp; TSH-Libtayo; - Dr.B ? ? ? ? ? ?

## 2022-01-10 NOTE — Progress Notes (Signed)
Wife states that pt is tired all of the time  ?

## 2022-01-11 ENCOUNTER — Encounter: Payer: Self-pay | Admitting: Licensed Clinical Social Worker

## 2022-01-11 ENCOUNTER — Other Ambulatory Visit: Payer: BC Managed Care – PPO | Admitting: Licensed Clinical Social Worker

## 2022-01-11 NOTE — Progress Notes (Signed)
Chris [name] Progress Note ? ?Clinical Social Worker  spoke with caregiver [name],Chris [name] (Spouse) via telehealth  to  discuss adjustment concerns.  [name] and Chris. Chris [name] discussed different ways to manage anxiety over patient's well being, thought rumination and including patient in daily chores around the home.  Chris [name] stated she made the decision to take over all household chores, which she use to share with the patient once he received his diagnosis.  [name] and Chris [name] discussed ways to split household chores again, and hoe to maintain open communication with each other, if and when the patient feels he can no longer do the chores.  [name] and Chris [name] discussed setting up boundaries for visitors and discussing with family and friends ways they can assist the patient's recovery by respecting their boundaries.  [name] and Chris [name] scheduled appt for 3/20 at 5:00PM ? ? ? ?Chris [name] , LCSW ?

## 2022-01-12 ENCOUNTER — Telehealth: Payer: Self-pay

## 2022-01-12 ENCOUNTER — Encounter: Payer: Self-pay | Admitting: Oncology

## 2022-01-12 NOTE — Telephone Encounter (Signed)
Mychart message:We would like some information/education on the upcoming immunotherapy appointment January 31, 2022 . ?We are curious on what to expect and how this work. ?  ?Thank-you, ?Richardson Landry & Tanna Furry  ? ? ? ?Pt's wife would like to know more info on the treatments the pt is going to receive on 4/5. What the treatments are, what it will do, what kind of side effects the tx has, will the pt be able to work, and how he will feel after he has the tx. ? ? ?I explained this is not a new drug for him. He has been on this since 10/2021. Explained to her how to see the treatments and all the info on what he receives in his mychart and we went over which is chemo and immuno, questions answered, she seemed to understand. Advised if she had any other concerns/questions to please call. ?

## 2022-01-13 ENCOUNTER — Other Ambulatory Visit: Payer: Self-pay | Admitting: Internal Medicine

## 2022-01-15 ENCOUNTER — Encounter: Payer: Self-pay | Admitting: Licensed Clinical Social Worker

## 2022-01-15 ENCOUNTER — Other Ambulatory Visit: Payer: Self-pay

## 2022-01-15 ENCOUNTER — Encounter: Payer: Self-pay | Admitting: Hospice and Palliative Medicine

## 2022-01-15 ENCOUNTER — Inpatient Hospital Stay (HOSPITAL_BASED_OUTPATIENT_CLINIC_OR_DEPARTMENT_OTHER): Payer: BC Managed Care – PPO | Admitting: Hospice and Palliative Medicine

## 2022-01-15 ENCOUNTER — Encounter: Payer: Self-pay | Admitting: Internal Medicine

## 2022-01-15 ENCOUNTER — Ambulatory Visit: Payer: BC Managed Care – PPO

## 2022-01-15 ENCOUNTER — Encounter: Payer: Self-pay | Admitting: *Deleted

## 2022-01-15 VITALS — BP 105/73 | HR 98 | Temp 97.5°F | Resp 18 | Ht 71.0 in | Wt 242.9 lb

## 2022-01-15 DIAGNOSIS — C3411 Malignant neoplasm of upper lobe, right bronchus or lung: Secondary | ICD-10-CM | POA: Diagnosis not present

## 2022-01-15 DIAGNOSIS — R530 Neoplastic (malignant) related fatigue: Secondary | ICD-10-CM

## 2022-01-15 DIAGNOSIS — C792 Secondary malignant neoplasm of skin: Secondary | ICD-10-CM | POA: Diagnosis not present

## 2022-01-15 DIAGNOSIS — C7951 Secondary malignant neoplasm of bone: Secondary | ICD-10-CM | POA: Diagnosis not present

## 2022-01-15 DIAGNOSIS — F419 Anxiety disorder, unspecified: Secondary | ICD-10-CM | POA: Diagnosis not present

## 2022-01-15 DIAGNOSIS — Z79899 Other long term (current) drug therapy: Secondary | ICD-10-CM | POA: Diagnosis not present

## 2022-01-15 DIAGNOSIS — Z5111 Encounter for antineoplastic chemotherapy: Secondary | ICD-10-CM | POA: Diagnosis not present

## 2022-01-15 LAB — CBC WITH DIFFERENTIAL/PLATELET
Abs Immature Granulocytes: 0.01 10*3/uL (ref 0.00–0.07)
Basophils Absolute: 0 10*3/uL (ref 0.0–0.1)
Basophils Relative: 0 %
Eosinophils Absolute: 0 10*3/uL (ref 0.0–0.5)
Eosinophils Relative: 0 %
HCT: 43.1 % (ref 39.0–52.0)
Hemoglobin: 14.9 g/dL (ref 13.0–17.0)
Immature Granulocytes: 0 %
Lymphocytes Relative: 28 %
Lymphs Abs: 1.2 10*3/uL (ref 0.7–4.0)
MCH: 32.3 pg (ref 26.0–34.0)
MCHC: 34.6 g/dL (ref 30.0–36.0)
MCV: 93.5 fL (ref 80.0–100.0)
Monocytes Absolute: 0.2 10*3/uL (ref 0.1–1.0)
Monocytes Relative: 4 %
Neutro Abs: 3 10*3/uL (ref 1.7–7.7)
Neutrophils Relative %: 68 %
Platelets: 206 10*3/uL (ref 150–400)
RBC: 4.61 MIL/uL (ref 4.22–5.81)
RDW: 17.4 % — ABNORMAL HIGH (ref 11.5–15.5)
WBC: 4.5 10*3/uL (ref 4.0–10.5)
nRBC: 0 % (ref 0.0–0.2)

## 2022-01-15 LAB — COMPREHENSIVE METABOLIC PANEL
ALT: 37 U/L (ref 0–44)
AST: 27 U/L (ref 15–41)
Albumin: 4.3 g/dL (ref 3.5–5.0)
Alkaline Phosphatase: 75 U/L (ref 38–126)
Anion gap: 8 (ref 5–15)
BUN: 21 mg/dL — ABNORMAL HIGH (ref 6–20)
CO2: 25 mmol/L (ref 22–32)
Calcium: 8.9 mg/dL (ref 8.9–10.3)
Chloride: 102 mmol/L (ref 98–111)
Creatinine, Ser: 0.96 mg/dL (ref 0.61–1.24)
GFR, Estimated: >60 mL/min (ref >60)
Glucose, Bld: 134 mg/dL — ABNORMAL HIGH (ref 70–99)
Potassium: 3.7 mmol/L (ref 3.5–5.1)
Sodium: 135 mmol/L (ref 135–145)
Total Bilirubin: 0.3 mg/dL (ref 0.3–1.2)
Total Protein: 7.6 g/dL (ref 6.5–8.1)

## 2022-01-15 MED ORDER — SODIUM CHLORIDE 0.9 % IV SOLN
Freq: Once | INTRAVENOUS | Status: AC
  Filled 2022-01-15: qty 250

## 2022-01-15 NOTE — Progress Notes (Signed)
[facility] CSW Progress Note ? ?Clinical Social Worker contacted caregiver by phone to follow-up on patient visit today. [name] the patient was feeling better after his talk with [name] and was currently resting,  [name] thanked me for calling because she was feeling a little anxious and stressed about the patient not feeling well. CSW and [name] spoke about different ways to manage stress and anxiety, and how to assist the patient as well.  CSW and [name] spoke about the importance of resting and drinking sufficient fluids, for both the patient and her as well. [name] stated she was feeling better and agreed to schedule appointment to speak with CSW next Monday 01/22/2022 at 5:00.. ? ? ? ?Chris [name] , LCSW ?

## 2022-01-15 NOTE — Telephone Encounter (Signed)
Called patient and spoke with wife, Chris [name]. She states "something just isnt right this time." Pt received treatment on Wednesday without any issues, was fine Thursday and wife noticed "his balance was off" on Friday. Pt c/o nausea and has been taking antiemetics. Denies vomiting. Wife also reports that patient has been more lethargic than normal. Spoke with on call provider last night that advised him to come in to be seen.  ?

## 2022-01-15 NOTE — Progress Notes (Signed)
? ?Symptom Management Clinic ?[facility] ?Telephone:([phone] Fax:([phone]) [phone] ? ?Patient Care Team: ?Sherrie Mustache, MD as PCP - General (Internal Medicine) ?Runell Gess, MD as PCP - Cardiology (Cardiology) ?[name], MD as Consulting Physician (Oncology) ?Sherrie Mustache, MD as Referring Physician (Internal Medicine) ?Earna Coder, MD as Consulting Physician (Internal Medicine) ?Earna Coder, MD as Consulting Physician (Internal Medicine)  ? ?Name of the patient: Chris [name]  ?[id number]  ?Jun 27, 1961  ? ?Date of visit: 01/15/22 ? ?Reason for Consult: ?Chris [name] is a 61 y.o. male with multiple medical problems including stage IV non-small cell lung cancer with metastasis to femur/liver diagnosed in December 2022.  Patient is currently on treatment with carbo/Alimta/Libtayo but has had poor tolerance to treatment.  He is status post SBRT of right upper lobe lung lesion and right proximal femoral lesion.  ? ?Patient last saw Dr. Donneta Romberg on 01/10/2022.  At that time, patient complained of fatigue following chemotherapy.  Patient proceeded with cycle 4 carbo/Libtayo/Alimta chemotherapy with plans to order repeat imaging at time of next visit. ? ?Patient presents to [facility] today for evaluation of "feeling off" since last chemotherapy.  He reports that he has not been eating or drinking as much and has been sleeping more often over the past several days.  He denies fever or chills.  No headaches or visual changes.  He has felt some imbalance with position changes but denies overt dizziness.  Denies weakness or falls.  He was able to work last week. ? ?Last MRI of the brain on 10/13/2021 was without evidence of of metastasis but noted small vessel vascular disease. ? ?Denies any neurologic complaints. Denies recent fevers or illnesses. Denies any easy bleeding or bruising. Reports fair appetite and denies weight loss. Denies chest pain.  Denies any nausea, vomiting, constipation, or diarrhea. Denies urinary complaints. Patient offers no further specific complaints today. ? ?PAST MEDICAL HISTORY: ?Past Medical History:  ?Diagnosis Date  ? Anxiety   ? associated with medical care,  worsened by difficulty hearing, does better with wife present  ? Complication of anesthesia   ? wife states very anxious may need pre sedation  ? Coronary artery disease   ? GERD (gastroesophageal reflux disease)   ? Hearing loss   ? mild per wife  ? Hearing loss   ? no hearing aids per wife  ? Hyperlipidemia   ? Hypertension   ? MI (myocardial infarction) (HCC) 04/17/2006  ? Acute inferolateral wall MI with cardiogenic shock and complete heart block  ? Primary adenocarcinoma of upper lobe of right lung (HCC)   ? Prostate cancer (HCC)   ? Tobacco abuse   ? Wears glasses   ? Wears partial dentures   ? Upper  ? ? ?PAST SURGICAL HISTORY:  ?Past Surgical History:  ?Procedure Laterality Date  ? BRONCHIAL BIOPSY  10/09/2021  ? Procedure: BRONCHIAL BIOPSIES;  Surgeon: Leslye Peer, MD;  Location: [facility] ENDOSCOPY;  Service: Pulmonary;;  ? BRONCHIAL BRUSHINGS  10/09/2021  ? Procedure: BRONCHIAL BRUSHINGS;  Surgeon: Leslye Peer, MD;  Location: [facility] ENDOSCOPY;  Service: Pulmonary;;  ? BRONCHIAL NEEDLE ASPIRATION BIOPSY  10/09/2021  ? Procedure: BRONCHIAL NEEDLE ASPIRATION BIOPSIES;  Surgeon: Leslye Peer, MD;  Location: [facility] ENDOSCOPY;  Service: Pulmonary;;  ? CARDIAC CATHETERIZATION  2007  ? left, RCA 100% occluded ruptured plaque with thrombus in the proximal segment  ? CYST EXCISION N/A 08/30/2021  ? Procedure: EXCISION OF POSTERIOR SCALP CYST;  Surgeon: Clovis Riley, MD;  Location: [facility] ORS;  Service: General;  Laterality: N/A;  ? CYSTOSCOPY N/A 07/17/2018  ? Procedure: CYSTOSCOPY;  Surgeon: Franchot Gallo, MD;  Location: [facility];  Service: Urology;  Laterality: N/A;  no seeds in bladder per Dr Diona Fanti  ? FIDUCIAL MARKER PLACEMENT  10/09/2021  ?  Procedure: FIDUCIAL MARKER PLACEMENT;  Surgeon: Collene Gobble, MD;  Location: [facility] ENDOSCOPY;  Service: Pulmonary;;  ? HAND SURGERY Right   ? has metal plate in arm  ? PROSTATE BIOPSY    ? RADIOACTIVE SEED IMPLANT N/A 07/17/2018  ? Procedure: RADIOACTIVE SEED IMPLANT/BRACHYTHERAPY IMPLANT;  Surgeon: Franchot Gallo, MD;  Location: [facility];  Service: Urology;  Laterality: N/A;  77 seeds  ? SPACE OAR INSTILLATION N/A 07/17/2018  ? Procedure: SPACE OAR INSTILLATION;  Surgeon: Franchot Gallo, MD;  Location: [facility];  Service: Urology;  Laterality: N/A;  ? VIDEO BRONCHOSCOPY WITH RADIAL ENDOBRONCHIAL ULTRASOUND  10/09/2021  ? Procedure: VIDEO BRONCHOSCOPY WITH RADIAL ENDOBRONCHIAL ULTRASOUND;  Surgeon: Collene Gobble, MD;  Location: [facility] ENDOSCOPY;  Service: Pulmonary;;  ? WRIST SURGERY  10/04/2012  ? ? ?HEMATOLOGY/ONCOLOGY HISTORY:  ?Oncology History Overview Note  ?DIAGNOSIS: ?1) stage IV (T1c, N0, M1 C) non-small cell lung cancer favoring adenocarcinoma presented with right lung apical nodule in addition to metastatic disease in the right hepatic lobe and the proximal right femoral diaphysis diagnosed and December 2022. 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 ? ?QNS; Guardant 360 showed positive KRAS G12C mutation ? ?FINAL MICROSCOPIC DIAGNOSIS:  ? ?A. LUNG, RUL, FINE NEEDLE ASPIRATION:  ?- Malignant cells consistent with non-small cell carcinoma, see comment  ? ?B. LUNG, RUL, BRUSHING:  ?- Malignant cells consistent with non-small cell carcinoma, see comment  ? ? ? ? ? ?COMMENT:  ? ?A and B.  Dr. Saralyn Pilar reviewed the case and concurs with the diagnosis.  ?Only rare malignant cells are present on the cellblock.  ?Immunohistochemical stains were attempted and show that the tumor cells  ?have patchy staining for TTF-1 whereas p63, p40 and CK5/6 are negative.  ?The findings are  nondiagnostic but suggestive of a lung adenocarcinoma.  ?Dr. Lamonte Sakai was notified on 10/13/2021.  ? ?SEP-OCT 2022-  [Dermatology]-   Poorly differentiated squamous cell carcinoma; -  Carcinoma extends to the edges of the excision specimen  ? ?IMPRESSION: ?1. The spiculated nodule at the right lung apex on recent neck CT is ?hypermetabolic and is concerning for primary bronchogenic carcinoma. ?Tissue sampling recommended. ?2. Hypermetabolic activity within the occipital scalp and small ?previously demonstrated right occipital lymph node compatible with ?known squamous cell carcinoma. Evaluation of the head and neck ?limited by motion artifact. ?3. Hypermetabolic lesions inferiorly in the right hepatic lobe and ?in the proximal right femoral diaphysis suspicious for metastatic ?disease, primary uncertain in this patient with a history of ?prostate cancer. Correlate with PSA levels. Abdominal MRI without ?and with contrast may be helpful for further characterization of the ?liver lesion. ? ?# 2007MI-CAD [s/p stent-Dr.Berry; EF 2021- 58%] ?  ?  ?Primary adenocarcinoma of upper lobe of right lung ([place])  ?10/18/2021 Initial Diagnosis  ? Primary adenocarcinoma of upper lobe of right lung [facility]) ?  ?10/18/2021 Cancer Staging  ? Staging form: Lung, AJCC 8th Edition ?- Clinical: Stage IVB (cT1c, cN0, cM1c) - Signed by Curt Bears, MD on 10/18/2021 ? ?  ?11/01/2021 -  Chemotherapy  ? Patient  is on Treatment Plan : LUNG NSCLC Pemetrexed + Carboplatin q21d x 4 Cycles  ?   ? ? ?ALLERGIES:  has No Known Allergies. ? ?MEDICATIONS:  ?Current Outpatient Medications  ?Medication Sig Dispense Refill  ? ALPRAZolam (XANAX) 0.25 MG tablet Take 1 tablet (0.25 mg total) by mouth at bedtime. (Patient not taking: Reported on 01/10/2022) 15 tablet 0  ? atorvastatin (LIPITOR) 20 MG tablet TAKE 1 TABLET BY MOUTH EVERY DAY 90 tablet 3  ? clopidogrel (PLAVIX) 75 MG tablet Take 1 tablet (75 mg total) by mouth daily. Appointment needed 30 tablet 0  ?  folic acid (FOLVITE) 1 MG tablet Take 1 tablet (1 mg total) by mouth daily. 30 tablet 4  ? ketoconazole (NIZORAL) 2 % cream Apply 1 application topically 2 (two) times daily as needed for irritation.    ? metoprolol

## 2022-01-15 NOTE — Progress Notes (Signed)
Pt had treatment on Wednesday, nausea and trouble with balance began on Friday morning. Wife reports patient has been more lethargic than usual. Denies vomiting. Pt wife reports pt "just being off, disoriented and its just different this time." Pt ambulatory to room without assistance at this time. ?

## 2022-01-16 ENCOUNTER — Ambulatory Visit: Payer: BC Managed Care – PPO | Admitting: Cardiovascular Disease

## 2022-01-17 ENCOUNTER — Inpatient Hospital Stay: Payer: BC Managed Care – PPO

## 2022-01-21 ENCOUNTER — Encounter: Payer: Self-pay | Admitting: Hospice and Palliative Medicine

## 2022-01-21 ENCOUNTER — Encounter: Payer: Self-pay | Admitting: Internal Medicine

## 2022-01-22 ENCOUNTER — Other Ambulatory Visit: Payer: Self-pay | Admitting: Emergency Medicine

## 2022-01-22 ENCOUNTER — Encounter: Payer: Self-pay | Admitting: Hospice and Palliative Medicine

## 2022-01-22 MED ORDER — IBUPROFEN 600 MG PO TABS: 600.0000 mg | ORAL_TABLET | Freq: Four times a day (QID) | ORAL | 2 refills | Status: DC | PRN

## 2022-01-22 MED ORDER — IBUPROFEN 400 MG PO TABS: 400.0000 mg | ORAL_TABLET | Freq: Four times a day (QID) | ORAL | 2 refills | Status: DC | PRN

## 2022-01-23 ENCOUNTER — Encounter: Payer: Self-pay | Admitting: *Deleted

## 2022-01-24 ENCOUNTER — Inpatient Hospital Stay: Payer: BC Managed Care – PPO

## 2022-01-31 ENCOUNTER — Inpatient Hospital Stay: Payer: BC Managed Care – PPO

## 2022-01-31 ENCOUNTER — Encounter: Payer: Self-pay | Admitting: Licensed Clinical Social Worker

## 2022-01-31 ENCOUNTER — Encounter: Payer: Self-pay | Admitting: Oncology

## 2022-01-31 ENCOUNTER — Inpatient Hospital Stay: Payer: BC Managed Care – PPO | Attending: Internal Medicine

## 2022-01-31 ENCOUNTER — Inpatient Hospital Stay (HOSPITAL_BASED_OUTPATIENT_CLINIC_OR_DEPARTMENT_OTHER): Payer: BC Managed Care – PPO | Admitting: Oncology

## 2022-01-31 ENCOUNTER — Encounter: Payer: Self-pay | Admitting: Radiation Oncology

## 2022-01-31 VITALS — BP 138/98 | HR 71 | Temp 96.1°F | Resp 17 | Wt 244.0 lb

## 2022-01-31 DIAGNOSIS — R530 Neoplastic (malignant) related fatigue: Secondary | ICD-10-CM | POA: Diagnosis not present

## 2022-01-31 DIAGNOSIS — Z8546 Personal history of malignant neoplasm of prostate: Secondary | ICD-10-CM | POA: Insufficient documentation

## 2022-01-31 DIAGNOSIS — Z5111 Encounter for antineoplastic chemotherapy: Secondary | ICD-10-CM | POA: Insufficient documentation

## 2022-01-31 DIAGNOSIS — C4442 Squamous cell carcinoma of skin of scalp and neck: Secondary | ICD-10-CM | POA: Diagnosis not present

## 2022-01-31 DIAGNOSIS — Z5112 Encounter for antineoplastic immunotherapy: Secondary | ICD-10-CM | POA: Diagnosis not present

## 2022-01-31 DIAGNOSIS — Z79899 Other long term (current) drug therapy: Secondary | ICD-10-CM | POA: Diagnosis not present

## 2022-01-31 DIAGNOSIS — C3411 Malignant neoplasm of upper lobe, right bronchus or lung: Secondary | ICD-10-CM

## 2022-01-31 LAB — COMPREHENSIVE METABOLIC PANEL
ALT: 33 U/L (ref 0–44)
AST: 26 U/L (ref 15–41)
Albumin: 4.3 g/dL (ref 3.5–5.0)
Alkaline Phosphatase: 65 U/L (ref 38–126)
Anion gap: 8 (ref 5–15)
BUN: 17 mg/dL (ref 6–20)
CO2: 23 mmol/L (ref 22–32)
Calcium: 9 mg/dL (ref 8.9–10.3)
Chloride: 106 mmol/L (ref 98–111)
Creatinine, Ser: 0.98 mg/dL (ref 0.61–1.24)
GFR, Estimated: >60 mL/min (ref >60)
Glucose, Bld: 112 mg/dL — ABNORMAL HIGH (ref 70–99)
Potassium: 3.7 mmol/L (ref 3.5–5.1)
Sodium: 137 mmol/L (ref 135–145)
Total Bilirubin: 0.5 mg/dL (ref 0.3–1.2)
Total Protein: 7.3 g/dL (ref 6.5–8.1)

## 2022-01-31 LAB — CBC WITH DIFFERENTIAL/PLATELET
Abs Immature Granulocytes: 0.01 10*3/uL (ref 0.00–0.07)
Basophils Absolute: 0 10*3/uL (ref 0.0–0.1)
Basophils Relative: 0 %
Eosinophils Absolute: 0 10*3/uL (ref 0.0–0.5)
Eosinophils Relative: 1 %
HCT: 41.3 % (ref 39.0–52.0)
Hemoglobin: 14.4 g/dL (ref 13.0–17.0)
Immature Granulocytes: 0 %
Lymphocytes Relative: 27 %
Lymphs Abs: 1.1 10*3/uL (ref 0.7–4.0)
MCH: 33.5 pg (ref 26.0–34.0)
MCHC: 34.9 g/dL (ref 30.0–36.0)
MCV: 96 fL (ref 80.0–100.0)
Monocytes Absolute: 0.7 10*3/uL (ref 0.1–1.0)
Monocytes Relative: 16 %
Neutro Abs: 2.3 10*3/uL (ref 1.7–7.7)
Neutrophils Relative %: 56 %
Platelets: 169 10*3/uL (ref 150–400)
RBC: 4.3 MIL/uL (ref 4.22–5.81)
RDW: 17.8 % — ABNORMAL HIGH (ref 11.5–15.5)
WBC: 4.1 10*3/uL (ref 4.0–10.5)
nRBC: 0 % (ref 0.0–0.2)

## 2022-01-31 LAB — TSH: TSH: 1.512 u[IU]/mL (ref 0.350–4.500)

## 2022-01-31 MED ORDER — ONDANSETRON HCL 4 MG/2ML IJ SOLN
4.0000 mg | Freq: Once | INTRAMUSCULAR | Status: AC
  Administered 2022-01-31: 4 mg via INTRAVENOUS
  Filled 2022-01-31: qty 2

## 2022-01-31 MED ORDER — HEPARIN SOD (PORK) LOCK FLUSH 100 UNIT/ML IV SOLN
INTRAVENOUS | Status: DC
Start: 2022-01-31 — End: 2022-01-31
  Filled 2022-01-31: qty 5

## 2022-01-31 MED ORDER — SODIUM CHLORIDE 0.9 % IV SOLN
Freq: Once | INTRAVENOUS | Status: AC
  Filled 2022-01-31: qty 250

## 2022-01-31 MED ORDER — SODIUM CHLORIDE 0.9 % IV SOLN
350.0000 mg | Freq: Once | INTRAVENOUS | Status: AC
  Administered 2022-01-31: 350 mg via INTRAVENOUS
  Filled 2022-01-31 (×2): qty 7

## 2022-01-31 NOTE — Progress Notes (Signed)
[facility] CSW Progress Note ? ?Clinical Social Worker met with caregiver to discuss adjustment concerns.  CSW met with Chris [name], main caregiver, while patient was in immunotherapy. CSW and Chris [name] dicussed financial concerns, and different agencies and resources that may be available.  CSW and Chris [name] discussed shot/long-term disability and medicaid criteria, and the difference between what medicaid and medicare cover.  Chris [name] and CSW discussed possible long term care issues and how to manage those and the types of resources available in the area.  Chris [name] spoke about upcoming scan and possible conversation about care changes.  Chris [name] scheduled appointment for 02/05/2022 5:00-5:45. ? ? ? ?Teresita Maxey , LCSW ?

## 2022-01-31 NOTE — Patient Instructions (Signed)
[facility] CANCER CTR AT [place]  Discharge Instructions: ?Thank you for choosing [facility] to provide your oncology and hematology care.  ?If you have a lab appointment with the [facility], please go directly to the [facility] and check in at the registration area. ? ?Wear comfortable clothing and clothing appropriate for easy access to any Portacath or PICC line.  ? ?We strive to give you quality time with your provider. You may need to reschedule your appointment if you arrive late (15 or more minutes).  Arriving late affects you and other patients whose appointments are after yours.  Also, if you miss three or more appointments without notifying the office, you may be dismissed from the clinic at the provider?s discretion.    ?  ?For prescription refill requests, have your pharmacy contact our office and allow 72 hours for refills to be completed.   ? ?Today you received the following chemotherapy and/or immunotherapy agents : Libtayo / Hydration ? ? ? ?  ?To help prevent nausea and vomiting after your treatment, we encourage you to take your nausea medication as directed. ? ?BELOW ARE SYMPTOMS THAT SHOULD BE REPORTED IMMEDIATELY: ?*FEVER GREATER THAN 100.4 F (38 ?C) OR HIGHER ?*CHILLS OR SWEATING ?*NAUSEA AND VOMITING THAT IS NOT CONTROLLED WITH YOUR NAUSEA MEDICATION ?*UNUSUAL SHORTNESS OF BREATH ?*UNUSUAL BRUISING OR BLEEDING ?*URINARY PROBLEMS (pain or burning when urinating, or frequent urination) ?*BOWEL PROBLEMS (unusual diarrhea, constipation, pain near the anus) ?TENDERNESS IN MOUTH AND THROAT WITH OR WITHOUT PRESENCE OF ULCERS (sore throat, sores in mouth, or a toothache) ?UNUSUAL RASH, SWELLING OR PAIN  ?UNUSUAL VAGINAL DISCHARGE OR ITCHING  ? ?Items with * indicate a potential emergency and should be followed up as soon as possible or go to the Emergency Department if any problems should occur. ? ?Please show the CHEMOTHERAPY ALERT CARD or IMMUNOTHERAPY ALERT CARD  at check-in to the Emergency Department and triage nurse. ? ?Should you have questions after your visit or need to cancel or reschedule your appointment, please contact [facility] CANCER [place] AT [place]  [phone] and follow the prompts.  Office hours are 8:00 a.m. to 4:30 p.m. Monday - Friday. Please note that voicemails left after 4:00 p.m. may not be returned until the following business day.  We are closed weekends and major holidays. You have access to a nurse at all times for urgent questions. Please call the main number to the clinic [phone] and follow the prompts. ? ?For any non-urgent questions, you may also contact your provider using MyChart. We now offer e-Visits for anyone 78 and older to request care online for non-urgent symptoms. For details visit [email or web address]. ?  ?Also download the MyChart app! Go to the app store, search "MyChart", open the app, select [facility], and log in with your MyChart username and password. ? ?Due to Covid, a mask is required upon entering the hospital/clinic. If you do not have a mask, one will be given to you upon arrival. For doctor visits, patients may have 1 support person aged 69 or older with them. For treatment visits, patients cannot have anyone with them due to current Covid guidelines and our immunocompromised population.  ?

## 2022-01-31 NOTE — Progress Notes (Signed)
Patient here for oncology follow-up appointment, concerns of SOB with exertion, leg pain and fatigue ?

## 2022-01-31 NOTE — Progress Notes (Signed)
[facility] ?CONSULT NOTE ? ?Patient Care Team: ?Casilda Carls, MD as PCP - General (Internal Medicine) ?Lorretta Harp, MD as PCP - Cardiology (Cardiology) ?Curt Bears, MD as Consulting Physician (Oncology) ?Casilda Carls, MD as Referring Physician (Internal Medicine) ?Cammie Sickle, MD as Consulting Physician (Internal Medicine) ?Cammie Sickle, MD as Consulting Physician (Internal Medicine) ? ?CHIEF COMPLAINTS/PURPOSE OF CONSULTATION: lung cancer ? ?#  ?Oncology History Overview Note  ?DIAGNOSIS: ?1) stage IV (T1c, N0, M1 C) non-small cell lung cancer favoring adenocarcinoma presented with right lung apical nodule in addition to metastatic disease in the right hepatic lobe and the proximal right femoral diaphysis diagnosed and December 2022. 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 ? ?QNS; Guardant 360 showed positive KRAS G12C mutation ? ?FINAL MICROSCOPIC DIAGNOSIS:  ? ?A. LUNG, RUL, FINE NEEDLE ASPIRATION:  ?- Malignant cells consistent with non-small cell carcinoma, see comment  ? ?B. LUNG, RUL, BRUSHING:  ?- Malignant cells consistent with non-small cell carcinoma, see comment  ? ? ? ? ? ?COMMENT:  ? ?A and B.  Dr. Saralyn Pilar reviewed the case and concurs with the diagnosis.  ?Only rare malignant cells are present on the cellblock.  ?Immunohistochemical stains were attempted and show that the tumor cells  ?have patchy staining for TTF-1 whereas p63, p40 and CK5/6 are negative.  ?The findings are nondiagnostic but suggestive of a lung adenocarcinoma.  ?Dr. Lamonte Sakai was notified on 10/13/2021.  ? ?SEP-OCT 2022-  [Dermatology]-   Poorly differentiated squamous cell carcinoma; -  Carcinoma extends to the edges of the excision specimen  ? ?IMPRESSION: ?1. The spiculated nodule at the right lung apex on recent neck CT is ?hypermetabolic and is concerning for primary bronchogenic  carcinoma. ?Tissue sampling recommended. ?2. Hypermetabolic activity within the occipital scalp and small ?previously demonstrated right occipital lymph node compatible with ?known squamous cell carcinoma. Evaluation of the head and neck ?limited by motion artifact. ?3. Hypermetabolic lesions inferiorly in the right hepatic lobe and ?in the proximal right femoral diaphysis suspicious for metastatic ?disease, primary uncertain in this patient with a history of ?prostate cancer. Correlate with PSA levels. Abdominal MRI without ?and with contrast may be helpful for further characterization of the ?liver lesion. ? ?# 2007MI-CAD [s/p stent-Dr.[facility]; EF 2021- 58%] ?  ?  ?Primary adenocarcinoma of upper lobe of right lung ([employer])  ?10/18/2021 Initial Diagnosis  ? Primary adenocarcinoma of upper lobe of right lung [facility]) ?  ?10/18/2021 Cancer Staging  ? Staging form: Lung, AJCC 8th Edition ?- Clinical: Stage IVB (cT1c, cN0, cM1c) - Signed by Curt Bears, MD on 10/18/2021 ?  ?11/01/2021 -  Chemotherapy  ? Patient is on Treatment Plan : LUNG NSCLC Pemetrexed + Carboplatin q21d x 4 Cycles  ?   ? ? ? ?HISTORY OF PRESENTING ILLNESS: Ambulating independently.  Accompanied by his wife.  ? ?Chris [name] 61 y.o.  male metastatic stage IV non-small cell lung cancer/favor adenocarcinoma, squamous cell carcinoma of the skin, presents for follow-up. ?Patient has finished 4 cycles of carboplatin, Alim[place],  libtayo ?During the interval, patient was seen by Altha Harm for evaluation of fatigue.  Patient had IV hydration and felt improvement. ?Patient reports feeling tired after chemotherapy. ?No nausea vomiting diarrhea. ?He is accompanied by his wife today. ? ?Review of Systems  ?Constitutional:  Positive for malaise/fatigue. Negative for chills, diaphoresis, fever and weight loss.  ?HENT:  Positive for hearing loss. Negative for  nosebleeds and sore throat.   ?Eyes:  Negative for double vision.  ?Respiratory:  Negative for cough,  hemoptysis, sputum production, shortness of breath and wheezing.   ?Cardiovascular:  Negative for chest pain, palpitations, orthopnea and leg swelling.  ?Gastrointestinal:  Negative for abdominal pain, blood in stool, diarrhea, heartburn, melena, nausea and vomiting.  ?Genitourinary:  Negative for dysuria, frequency and urgency.  ?Musculoskeletal:  Negative for back pain.  ?Skin: Negative.  Negative for itching and rash.  ?Neurological:  Negative for dizziness, tingling, focal weakness, weakness and headaches.  ?Endo/Heme/Allergies:  Does not bruise/bleed easily.  ?Psychiatric/Behavioral:  Negative for depression. The patient does not have insomnia.    ? ?MEDICAL HISTORY:  ?Past Medical History:  ?Diagnosis Date  ? Anxiety   ? associated with medical care,  worsened by difficulty hearing, does better with wife present  ? Complication of anesthesia   ? wife states very anxious may need pre sedation  ? Coronary artery disease   ? GERD (gastroesophageal reflux disease)   ? Hearing loss   ? mild per wife  ? Hearing loss   ? no hearing aids per wife  ? Hyperlipidemia   ? Hypertension   ? MI (myocardial infarction) ([place]) 04/17/2006  ? Acute inferolateral wall MI with cardiogenic shock and complete heart block  ? Primary adenocarcinoma of upper lobe of right lung ([place])   ? Prostate cancer ([place])   ? Tobacco abuse   ? Wears glasses   ? Wears partial dentures   ? Upper  ? ? ?SURGICAL HISTORY: ?Past Surgical History:  ?Procedure Laterality Date  ? BRONCHIAL BIOPSY  10/09/2021  ? Procedure: BRONCHIAL BIOPSIES;  Surgeon: Collene Gobble, MD;  Location[facility] ENDOSCOPY;  Service: Pulmonary;;  ? BRONCHIAL BRUSHINGS  10/09/2021  ? Procedure: BRONCHIAL BRUSHINGS;  Surgeon: Collene Gobble, MD;  Location[facility] ENDOSCOPY;  Service: Pulmonary;;  ? BRONCHIAL NEEDLE ASPIRATION BIOPSY  10/09/2021  ? Procedure: BRONCHIAL NEEDLE ASPIRATION BIOPSIES;  Surgeon: Collene Gobble, MD;  Location: [facility] ENDOSCOPY;  Service: Pulmonary;;  ? CARDIAC  CATHETERIZATION  2007  ? left, RCA 100% occluded ruptured plaque with thrombus in the proximal segment  ? CYST EXCISION N/A 08/30/2021  ? Procedure: EXCISION OF POSTERIOR SCALP CYST;  Surgeon: Clovis Riley, MD;  Location: [facility] ORS;  Service: General;  Laterality: N/A;  ? CYSTOSCOPY N/A 07/17/2018  ? Procedure: CYSTOSCOPY;  Surgeon: Franchot Gallo, MD;  Location: [facility];  Service: Urology;  Laterality: N/A;  no seeds in bladder per Dr Diona Fanti  ? FIDUCIAL MARKER PLACEMENT  10/09/2021  ? Procedure: FIDUCIAL MARKER PLACEMENT;  Surgeon: Collene Gobble, MD;  Location: [facility] ENDOSCOPY;  Service: Pulmonary;;  ? HAND SURGERY Right   ? has metal plate in arm  ? PROSTATE BIOPSY    ? RADIOACTIVE SEED IMPLANT N/A 07/17/2018  ? Procedure: RADIOACTIVE SEED IMPLANT/BRACHYTHERAPY IMPLANT;  Surgeon: Franchot Gallo, MD;  Location: [facility];  Service: Urology;  Laterality: N/A;  77 seeds  ? SPACE OAR INSTILLATION N/A 07/17/2018  ? Procedure: SPACE OAR INSTILLATION;  Surgeon: Franchot Gallo, MD;  [facility];  Service: Urology;  Laterality: N/A;  ? VIDEO BRONCHOSCOPY WITH RADIAL ENDOBRONCHIAL ULTRASOUND  10/09/2021  ? Procedure: VIDEO BRONCHOSCOPY WITH RADIAL ENDOBRONCHIAL ULTRASOUND;  Surgeon: Collene Gobble, MD;  Location: [facility] ENDOSCOPY;  Service: Pulmonary;;  ? WRIST SURGERY  10/04/2012  ? ? ?SOCIAL HISTORY: ?Social History  ? ?Socioeconomic History  ? Marital status: Married  ?  Spouse name: Not on file  ? Number of children: Not on file  ? Years of education: Not on file  ? Highest education level: Not on file  ?Occupational History  ? Not on file  ?Tobacco Use  ? Smoking status: Every Day  ?  Packs/day: 1.50  ?  Years: 42.00  ?  Pack years: 63.00  ?  Types: Cigarettes  ? Smokeless tobacco: Never  ?Vaping Use  ? Vaping Use: Never used  ?Substance and Sexual Activity  ? Alcohol use: No  ? Drug use: No  ? Sexual activity: Yes  ?  Birth control/protection:  None  ?Other Topics Concern  ? Not on file  ?Social History Narrative  ? 10-04 19 Unable to ask abuse questions wife with him today.  ? ?Social Determinants of Health  ? ?Financial Resource Strain: Not on file  ?Food [place]

## 2022-02-08 ENCOUNTER — Other Ambulatory Visit: Payer: Self-pay | Admitting: Cardiovascular Disease

## 2022-02-12 ENCOUNTER — Encounter: Payer: Self-pay | Admitting: *Deleted

## 2022-02-15 ENCOUNTER — Telehealth: Payer: Self-pay | Admitting: *Deleted

## 2022-02-15 NOTE — Telephone Encounter (Signed)
Patient is scheduled for lab/MD/Libtayo on 02/21/22.  Dr. Jacinto Reap should we keep his MD appt on 26th? ?

## 2022-02-15 NOTE — Telephone Encounter (Signed)
Patient wife called asking if there is any way patient can be seen Monday to get results from the MRI he is having tomorrow and not wait until the 26 th. She would prefer to see D rB, but is not opposed to seeing APP if he is not available. It looks like Dr B has an opening at 230 Monday. Please advise ?

## 2022-02-16 ENCOUNTER — Encounter: Payer: Self-pay | Admitting: Hospice and Palliative Medicine

## 2022-02-16 ENCOUNTER — Ambulatory Visit
Admission: RE | Admit: 2022-02-16 | Discharge: 2022-02-16 | Disposition: A | Discharge status: Home / Self Care | Payer: BC Managed Care – PPO | Source: Ambulatory Visit | Attending: Oncology | Admitting: Oncology

## 2022-02-16 ENCOUNTER — Other Ambulatory Visit: Payer: Self-pay | Admitting: Internal Medicine

## 2022-02-16 DIAGNOSIS — K7689 Other specified diseases of liver: Secondary | ICD-10-CM | POA: Diagnosis not present

## 2022-02-16 DIAGNOSIS — C3490 Malignant neoplasm of unspecified part of unspecified bronchus or lung: Secondary | ICD-10-CM | POA: Diagnosis not present

## 2022-02-16 DIAGNOSIS — C3411 Malignant neoplasm of upper lobe, right bronchus or lung: Secondary | ICD-10-CM | POA: Insufficient documentation

## 2022-02-16 DIAGNOSIS — I700 Atherosclerosis of aorta: Secondary | ICD-10-CM | POA: Diagnosis not present

## 2022-02-16 DIAGNOSIS — K5730 Diverticulosis of large intestine without perforation or abscess without bleeding: Secondary | ICD-10-CM | POA: Diagnosis not present

## 2022-02-16 DIAGNOSIS — J439 Emphysema, unspecified: Secondary | ICD-10-CM | POA: Diagnosis not present

## 2022-02-16 DIAGNOSIS — D3500 Benign neoplasm of unspecified adrenal gland: Secondary | ICD-10-CM | POA: Diagnosis not present

## 2022-02-16 DIAGNOSIS — I2510 Atherosclerotic heart disease of native coronary artery without angina pectoris: Secondary | ICD-10-CM | POA: Diagnosis not present

## 2022-02-16 DIAGNOSIS — J929 Pleural plaque without asbestos: Secondary | ICD-10-CM | POA: Diagnosis not present

## 2022-02-16 IMAGING — CT CT CHEST-ABD-PELV W/ CM
3 of 5 series · 14 of 36 positions shown, 16 images · IV contrast (agent unspecified)
Comparison: CT chest abdomen pelvis, [DATE], MR abdomen,
[DATE], PET-CT, [DATE]

CLINICAL DATA: Metastatic non-small cell lung cancer restaging,
chemotherapy and radiation complete, ongoing immunotherapy *
Tracking Code: BO *

EXAM:
CT CHEST, ABDOMEN, AND PELVIS WITH CONTRAST
TECHNIQUE: Multidetector CT imaging of the chest, abdomen and pelvis was
performed following the standard protocol during bolus
administration of intravenous contrast.

[Series 3: axials cap 5.00 · axial · 0.93mm/px · z∈[-1558,-973]mm · 9 of 147 slices shown, 11 images]
[im 15/147  mediastinal]
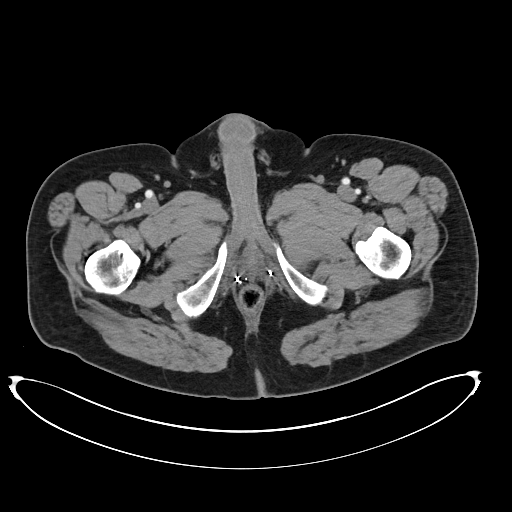
[im 15/147  bone]
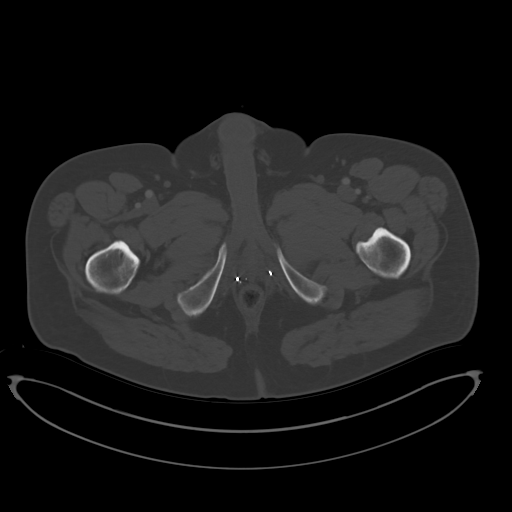
[im 30/147  mediastinal]
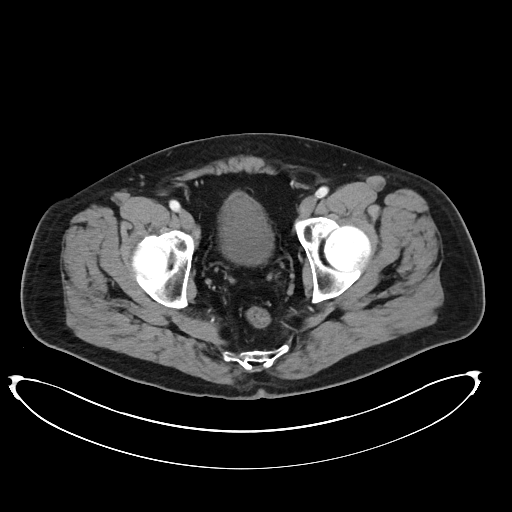
[im 44/147  mediastinal]
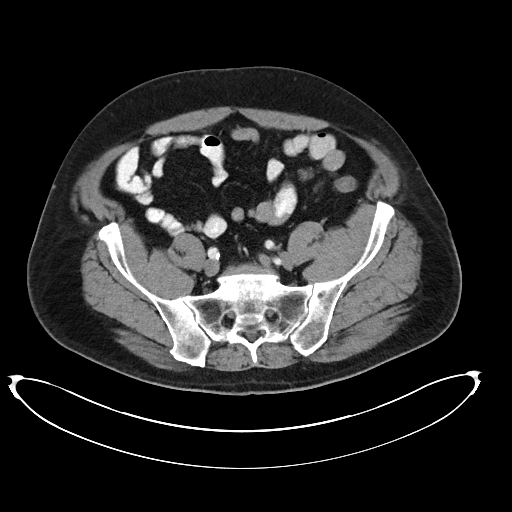
[im 59/147  mediastinal]
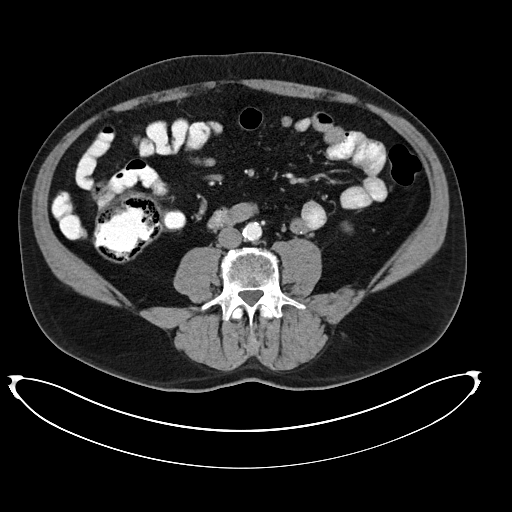
[im 74/147  mediastinal]
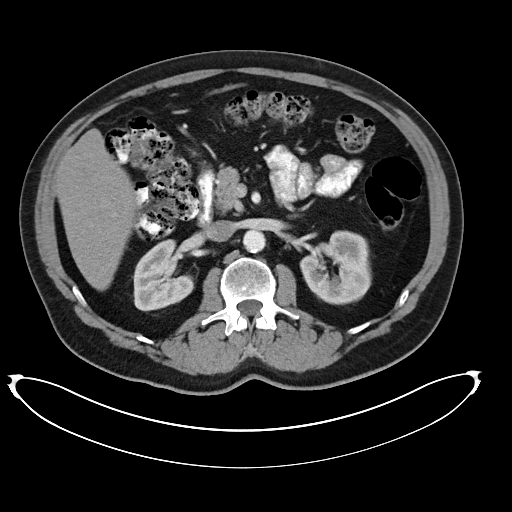
[im 88/147  mediastinal]
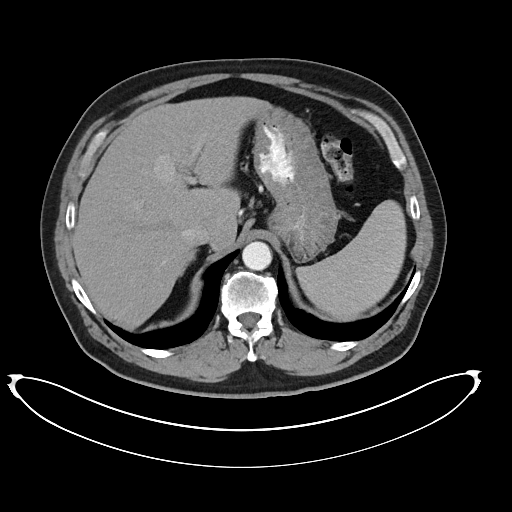
[im 103/147  mediastinal]
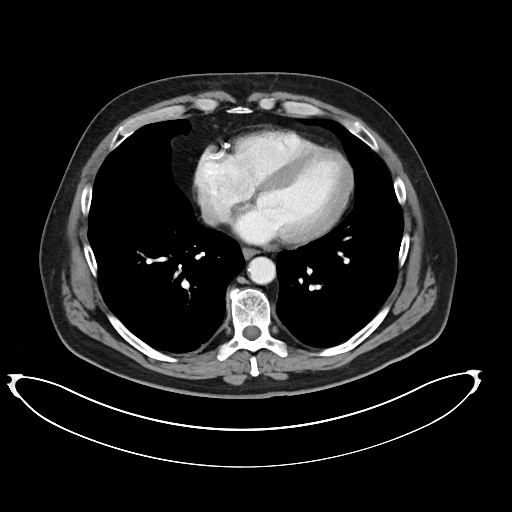
[im 117/147  mediastinal]
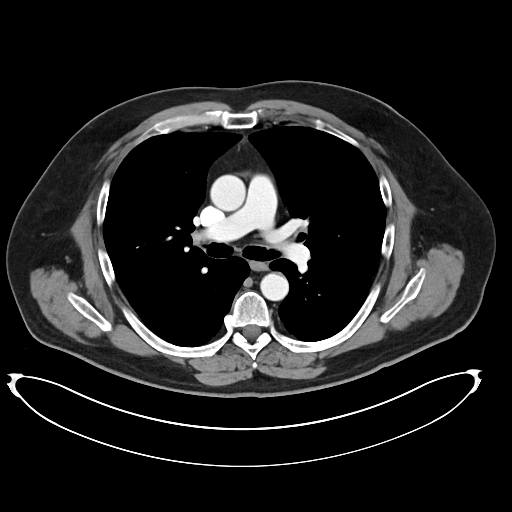
[im 132/147  mediastinal]
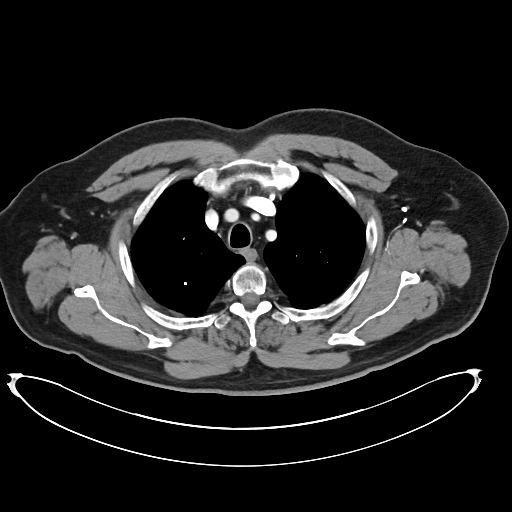
[im 132/147  bone]
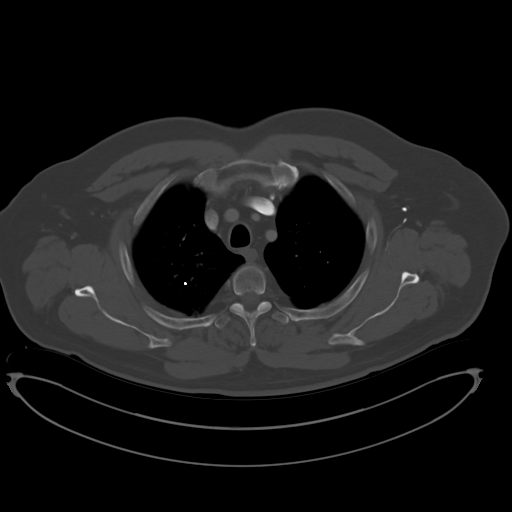

[Series 4: lungs cap 2.00 · axial · 0.93mm/px · z∈[-1218,-1164]mm · 2 of 175 slices shown]
[im 14/175  mediastinal]
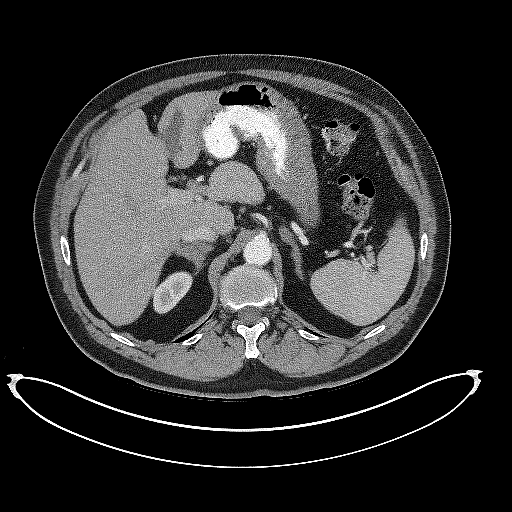
[im 41/175  mediastinal]
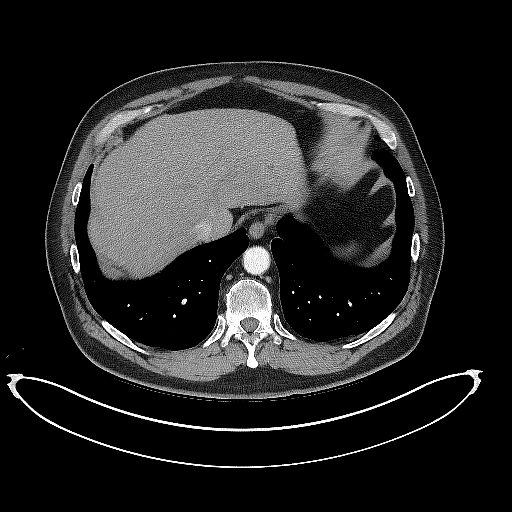

[Series 5: coronals cap 2.00 cor · coronal · 0.93mm/px · 3 of 165 slices shown]
[im 33/165  mediastinal]
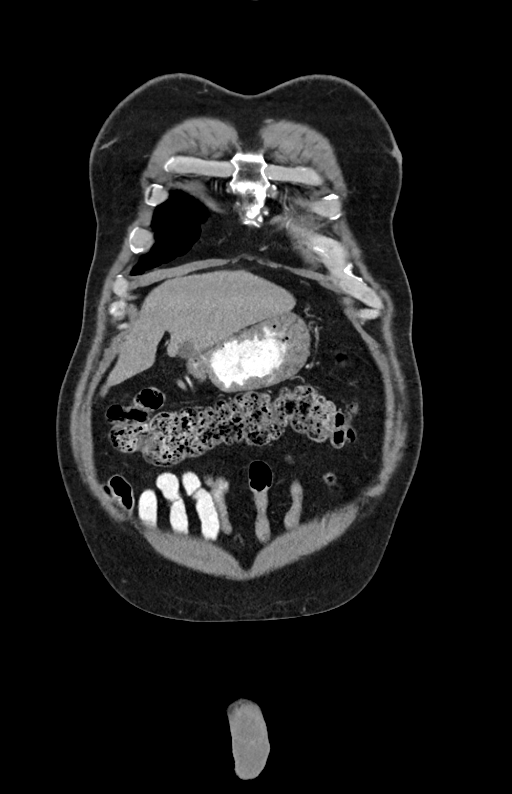
[im 66/165  mediastinal]
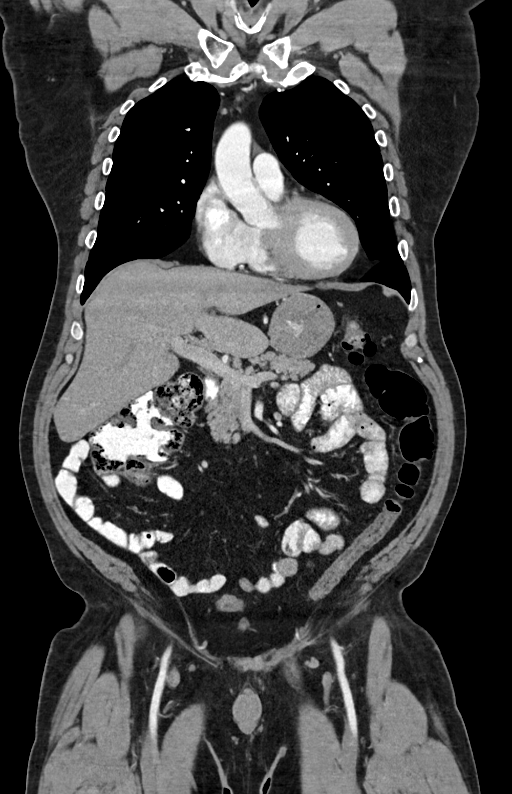
[im 99/165  mediastinal]
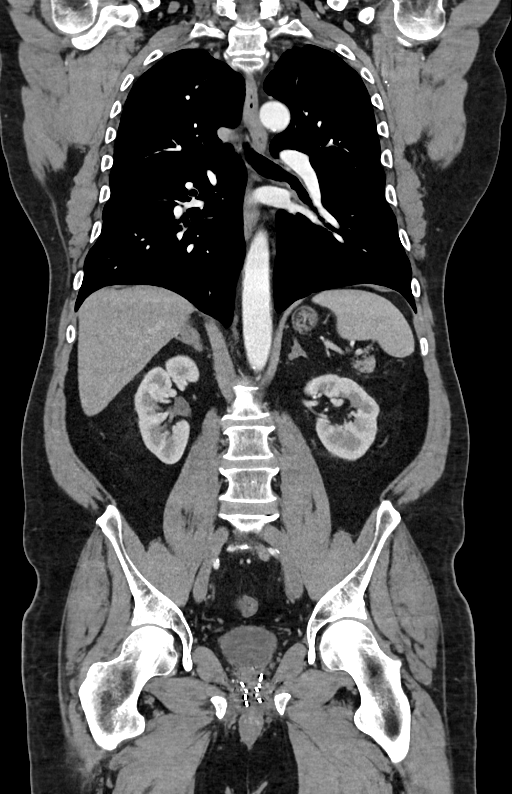

[14 of 36 positions shown; findings below may reference images not displayed]

RADIATION DOSE REDUCTION: This exam was performed according to the
departmental dose-optimization program which includes automated
exposure control, adjustment of the mA and/or kV according to
patient size and/or use of iterative reconstruction technique.

CONTRAST:  100mL OMNIPAQUE IOHEXOL 300 MG/ML SOLN additional oral
enteric contrast
FINDINGS: CT CHEST FINDINGS

Cardiovascular: Aortic atherosclerosis. Normal heart size. Right
coronary artery calcifications or stents. No pericardial effusion.

Mediastinum/Nodes: No enlarged mediastinal, hilar, or axillary lymph
nodes. Thyroid gland, trachea, and esophagus demonstrate no
significant findings.

Lungs/Pleura: No significant change in a spiculated nodule of the
posterior right pulmonary apex measuring 2.4 x 1.4 cm, previously
2.5 x 1.5 cm (series 4, image 31). Adjacent fiducial marking clip.
Mild paraseptal emphysema. Diffuse bilateral bronchial wall
thickening. No pleural effusion or pneumothorax.

Musculoskeletal: No chest wall mass or suspicious osseous lesions
identified.

CT ABDOMEN PELVIS FINDINGS

Hepatobiliary: Unchanged, very subtle hypodense lesion of hepatic
segment V, measuring 1.6 x 1.3 cm (series 3, image 70). No
gallstones, gallbladder wall thickening, or biliary dilatation.

Pancreas: Unremarkable. No pancreatic ductal dilatation or
surrounding inflammatory changes.

Spleen: Normal in size without significant abnormality.

Adrenals/Urinary Tract: Unchanged benign bilateral adrenal
adenomata, previously characterized by MR and PET-CT. Kidneys are
normal, without renal calculi, solid lesion, or hydronephrosis.
Bladder is unremarkable.

Stomach/Bowel: Stomach is within normal limits. Appendix appears
normal. No evidence of bowel wall thickening, distention, or
inflammatory changes. Sigmoid diverticulosis.

Vascular/Lymphatic: Aortic atherosclerosis. No enlarged abdominal or
pelvic lymph nodes.

Reproductive: No mass or other abnormality.

Other: No abdominal wall hernia or abnormality. No ascites.

Musculoskeletal: No acute osseous findings.
IMPRESSION: 1. No significant change in a spiculated nodule of the posterior
right pulmonary apex, in keeping with treated primary lung
malignancy.
2. Unchanged, very subtle hypodense lesion of hepatic segment V,
consistent with a metastasis better characterized by prior PET-CT
and MRI.
3. No evidence of lymphadenopathy or new metastatic disease in the
chest, abdomen, or pelvis.
4. Emphysema and diffuse bilateral bronchial wall thickening.
5. Coronary artery disease.

Aortic Atherosclerosis ([1C]-[1C]) and Emphysema ([1C]-[1C]).

## 2022-02-16 MED ORDER — IOHEXOL 300 MG/ML  SOLN
100.0000 mL | Freq: Once | INTRAMUSCULAR | Status: AC | PRN
  Administered 2022-02-16: 100 mL via INTRAVENOUS

## 2022-02-16 MED ORDER — PANTOPRAZOLE SODIUM 40 MG PO TBEC: 40.0000 mg | DELAYED_RELEASE_TABLET | Freq: Every day | ORAL | 1 refills | Status: DC

## 2022-02-16 NOTE — Progress Notes (Signed)
Called in protonix as requsted. GB ?

## 2022-02-19 ENCOUNTER — Telehealth: Payer: Self-pay

## 2022-02-19 ENCOUNTER — Inpatient Hospital Stay: Payer: BC Managed Care – PPO | Admitting: Medical Oncology

## 2022-02-19 ENCOUNTER — Encounter: Payer: Self-pay | Admitting: Licensed Clinical Social Worker

## 2022-02-19 ENCOUNTER — Telehealth: Payer: Self-pay | Admitting: Medical Oncology

## 2022-02-19 NOTE — Progress Notes (Signed)
[facility] CSW Progress Note ? ?Clinical Social Worker contacted caregiver by phone to schedule appointment to meet and discuss adjustment issues. Appointment made for Wednesday 4/26 at 9:00AM. ? ? ? ?Honesty Menta , LCSW ?

## 2022-02-19 NOTE — Telephone Encounter (Signed)
Prior authorization for Pantoprazole submitted via online Cover My Meds (Key: [id number]) ?

## 2022-02-19 NOTE — Telephone Encounter (Signed)
Pt wife called to cancel his follow-up today to review his CT scan results. They will discuss the results with Dr. Jacinto Reap on 4/26. ?

## 2022-02-19 NOTE — Telephone Encounter (Signed)
Noted  

## 2022-02-20 ENCOUNTER — Encounter: Payer: Self-pay | Admitting: Internal Medicine

## 2022-02-20 NOTE — Telephone Encounter (Signed)
PA has been denied on April 24.  Will inform patient of denial via [place]. ?

## 2022-02-21 ENCOUNTER — Encounter: Payer: Self-pay | Admitting: Internal Medicine

## 2022-02-21 ENCOUNTER — Encounter: Payer: Self-pay | Admitting: *Deleted

## 2022-02-21 ENCOUNTER — Encounter: Payer: Self-pay | Admitting: Licensed Clinical Social Worker

## 2022-02-21 ENCOUNTER — Inpatient Hospital Stay: Payer: BC Managed Care – PPO

## 2022-02-21 ENCOUNTER — Inpatient Hospital Stay (HOSPITAL_BASED_OUTPATIENT_CLINIC_OR_DEPARTMENT_OTHER): Payer: BC Managed Care – PPO | Admitting: Internal Medicine

## 2022-02-21 ENCOUNTER — Telehealth: Payer: Self-pay | Admitting: *Deleted

## 2022-02-21 DIAGNOSIS — Z79899 Other long term (current) drug therapy: Secondary | ICD-10-CM | POA: Diagnosis not present

## 2022-02-21 DIAGNOSIS — Z8546 Personal history of malignant neoplasm of prostate: Secondary | ICD-10-CM | POA: Diagnosis not present

## 2022-02-21 DIAGNOSIS — C3411 Malignant neoplasm of upper lobe, right bronchus or lung: Secondary | ICD-10-CM

## 2022-02-21 DIAGNOSIS — C4442 Squamous cell carcinoma of skin of scalp and neck: Secondary | ICD-10-CM | POA: Diagnosis not present

## 2022-02-21 DIAGNOSIS — Z5111 Encounter for antineoplastic chemotherapy: Secondary | ICD-10-CM | POA: Diagnosis not present

## 2022-02-21 LAB — CBC WITH DIFFERENTIAL/PLATELET
Abs Immature Granulocytes: 0.01 10*3/uL (ref 0.00–0.07)
Basophils Absolute: 0 10*3/uL (ref 0.0–0.1)
Basophils Relative: 1 %
Eosinophils Absolute: 0 10*3/uL (ref 0.0–0.5)
Eosinophils Relative: 1 %
HCT: 43.1 % (ref 39.0–52.0)
Hemoglobin: 14.8 g/dL (ref 13.0–17.0)
Immature Granulocytes: 0 %
Lymphocytes Relative: 21 %
Lymphs Abs: 1 10*3/uL (ref 0.7–4.0)
MCH: 33.2 pg (ref 26.0–34.0)
MCHC: 34.3 g/dL (ref 30.0–36.0)
MCV: 96.6 fL (ref 80.0–100.0)
Monocytes Absolute: 0.9 10*3/uL (ref 0.1–1.0)
Monocytes Relative: 20 %
Neutro Abs: 2.7 10*3/uL (ref 1.7–7.7)
Neutrophils Relative %: 57 %
Platelets: 201 10*3/uL (ref 150–400)
RBC: 4.46 MIL/uL (ref 4.22–5.81)
RDW: 15.1 % (ref 11.5–15.5)
WBC: 4.7 10*3/uL (ref 4.0–10.5)
nRBC: 0 % (ref 0.0–0.2)

## 2022-02-21 LAB — COMPREHENSIVE METABOLIC PANEL
ALT: 28 U/L (ref 0–44)
AST: 22 U/L (ref 15–41)
Albumin: 4.4 g/dL (ref 3.5–5.0)
Alkaline Phosphatase: 71 U/L (ref 38–126)
Anion gap: 10 (ref 5–15)
BUN: 17 mg/dL (ref 6–20)
CO2: 21 mmol/L — ABNORMAL LOW (ref 22–32)
Calcium: 9.1 mg/dL (ref 8.9–10.3)
Chloride: 105 mmol/L (ref 98–111)
Creatinine, Ser: 0.88 mg/dL (ref 0.61–1.24)
GFR, Estimated: >60 mL/min (ref >60)
Glucose, Bld: 105 mg/dL — ABNORMAL HIGH (ref 70–99)
Potassium: 3.8 mmol/L (ref 3.5–5.1)
Sodium: 136 mmol/L (ref 135–145)
Total Bilirubin: 0.3 mg/dL (ref 0.3–1.2)
Total Protein: 7.5 g/dL (ref 6.5–8.1)

## 2022-02-21 MED ORDER — SODIUM CHLORIDE 0.9 % IV SOLN
Freq: Once | INTRAVENOUS | Status: AC
  Filled 2022-02-21: qty 250

## 2022-02-21 MED ORDER — ONDANSETRON HCL 4 MG/2ML IJ SOLN
4.0000 mg | Freq: Once | INTRAMUSCULAR | Status: AC
  Administered 2022-02-21: 4 mg via INTRAVENOUS
  Filled 2022-02-21: qty 2

## 2022-02-21 MED ORDER — HEPARIN SOD (PORK) LOCK FLUSH 100 UNIT/ML IV SOLN
500.0000 [IU] | Freq: Once | INTRAVENOUS | Status: DC | PRN
  Filled 2022-02-21: qty 5

## 2022-02-21 MED ORDER — HEPARIN SOD (PORK) LOCK FLUSH 100 UNIT/ML IV SOLN
INTRAVENOUS | Status: AC
  Filled 2022-02-21: qty 5

## 2022-02-21 MED ORDER — SODIUM CHLORIDE 0.9 % IV SOLN
350.0000 mg | Freq: Once | INTRAVENOUS | Status: AC
  Administered 2022-02-21: 350 mg via INTRAVENOUS
  Filled 2022-02-21: qty 7

## 2022-02-21 NOTE — Patient Instructions (Signed)
[facility] CANCER CTR AT [place]  Discharge Instructions: ?Thank you for choosing [facility] to provide your oncology and hematology care.  ?If you have a lab appointment with the [facility], please go directly to the [facility] and check in at the registration area. ? ?Wear comfortable clothing and clothing appropriate for easy access to any Portacath or PICC line.  ? ?We strive to give you quality time with your provider. You may need to reschedule your appointment if you arrive late (15 or more minutes).  Arriving late affects you and other patients whose appointments are after yours.  Also, if you miss three or more appointments without notifying the office, you may be dismissed from the clinic at the provider?s discretion.    ?  ?For prescription refill requests, have your pharmacy contact our office and allow 72 hours for refills to be completed.   ? ?Today you received the following chemotherapy and/or immunotherapy agents Libtayo  ?  ?To help prevent nausea and vomiting after your treatment, we encourage you to take your nausea medication as directed. ? ?BELOW ARE SYMPTOMS THAT SHOULD BE REPORTED IMMEDIATELY: ?*FEVER GREATER THAN 100.4 F (38 ?C) OR HIGHER ?*CHILLS OR SWEATING ?*NAUSEA AND VOMITING THAT IS NOT CONTROLLED WITH YOUR NAUSEA MEDICATION ?*UNUSUAL SHORTNESS OF BREATH ?*UNUSUAL BRUISING OR BLEEDING ?*URINARY PROBLEMS (pain or burning when urinating, or frequent urination) ?*BOWEL PROBLEMS (unusual diarrhea, constipation, pain near the anus) ?TENDERNESS IN MOUTH AND THROAT WITH OR WITHOUT PRESENCE OF ULCERS (sore throat, sores in mouth, or a toothache) ?UNUSUAL RASH, SWELLING OR PAIN  ?UNUSUAL VAGINAL DISCHARGE OR ITCHING  ? ?Items with * indicate a potential emergency and should be followed up as soon as possible or go to the Emergency Department if any problems should occur. ? ?Please show the CHEMOTHERAPY ALERT CARD or IMMUNOTHERAPY ALERT CARD at check-in to the  Emergency Department and triage nurse. ? ?Should you have questions after your visit or need to cancel or reschedule your appointment, please contact [facility] CANCER [place] AT [place]  [phone] and follow the prompts.  Office hours are 8:00 a.m. to 4:30 p.m. Monday - Friday. Please note that voicemails left after 4:00 p.m. may not be returned until the following business day.  We are closed weekends and major holidays. You have access to a nurse at all times for urgent questions. Please call the main number to the clinic [phone] and follow the prompts. ? ?For any non-urgent questions, you may also contact your provider using MyChart. We now offer e-Visits for anyone 65 and older to request care online for non-urgent symptoms. For details visit [email or web address]. ?  ?Also download the MyChart app! Go to the app store, search "MyChart", open the app, select [facility], and log in with your MyChart username and password. ? ?Due to Covid, a mask is required upon entering the hospital/clinic. If you do not have a mask, one will be given to you upon arrival. For doctor visits, patients may have 1 support person aged 58 or older with them. For treatment visits, patients cannot have anyone with them due to current Covid guidelines and our immunocompromised population.  ?

## 2022-02-21 NOTE — Progress Notes (Signed)
C/o tiredness and mor sob. ?

## 2022-02-21 NOTE — Progress Notes (Signed)
[facility] CSW Progress Note ? ?Clinical Social Worker met with caregiver to discuss adjustment concerns and medical updates about patient's condition. CSW met with [name], main caregiver, while patient was in immunotherapy. CSW and [name] dicussed financial concerns, and different agencies and resources that may be available.  CSW and [name] discussed shot/long-term disability and medicaid criteria, and the difference between what medicaid and medicare cover.  [name] and CSW discussed possible long term care issues and how to manage those and the types of resources available in the area.  [name] updated CSW on results from scan.  [name] stated the Oncologist told her and the patient the tumors were the same size and there was no evidence of further growth or metastasis, and the estimated life expectancy if everything remained the same was about a year. [name] stated they are trying to figure out how to manage the news and to accept the results from the scan.  CSW and [name] spoke about different skills to use when having difficult conversations with each other and family members preparation for the next year. [facility] scheduled appointment for 02/26/2022 at 4:00PM ? ? ? ?Alaja Goldinger , LCSW ?

## 2022-02-21 NOTE — Progress Notes (Signed)
[facility] ?CONSULT NOTE ? ?Patient Care Team: ?Casilda Carls, MD as PCP - General (Internal Medicine) ?Lorretta Harp, MD as PCP - Cardiology (Cardiology) ?Curt Bears, MD as Consulting Physician (Oncology) ?Casilda Carls, MD as Referring Physician (Internal Medicine) ?Cammie Sickle, MD as Consulting Physician (Internal Medicine) ?Cammie Sickle, MD as Consulting Physician (Internal Medicine) ? ?CHIEF COMPLAINTS/PURPOSE OF CONSULTATION: lung cancer ? ?#  ?Oncology History Overview Note  ?DIAGNOSIS: ?1) stage IV (T1c, N0, M1 C) non-small cell lung cancer favoring adenocarcinoma presented with right lung apical nodule in addition to metastatic disease in the right hepatic lobe and the proximal right femoral diaphysis diagnosed and December 2022. 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 ? ?QNS; Guardant 360 showed positive KRAS G12C mutation ? ?FINAL MICROSCOPIC DIAGNOSIS:  ? ?A. LUNG, RUL, FINE NEEDLE ASPIRATION:  ?- Malignant cells consistent with non-small cell carcinoma, see comment  ? ?B. LUNG, RUL, BRUSHING:  ?- Malignant cells consistent with non-small cell carcinoma, see comment  ? ? ? ? ? ?COMMENT:  ? ?A and B.  Dr. Saralyn Pilar reviewed the case and concurs with the diagnosis.  ?Only rare malignant cells are present on the cellblock.  ?Immunohistochemical stains were attempted and show that the tumor cells  ?have patchy staining for TTF-1 whereas p63, p40 and CK5/6 are negative.  ?The findings are nondiagnostic but suggestive of a lung adenocarcinoma.  ?Dr. Lamonte Sakai was notified on 10/13/2021.  ? ?SEP-OCT 2022-  [Dermatology]-   Poorly differentiated squamous cell carcinoma; -  Carcinoma extends to the edges of the excision specimen  ? ?IMPRESSION: ?1. The spiculated nodule at the right lung apex on recent neck CT is ?hypermetabolic and is concerning for primary bronchogenic  carcinoma. ?Tissue sampling recommended. ?2. Hypermetabolic activity within the occipital scalp and small ?previously demonstrated right occipital lymph node compatible with ?known squamous cell carcinoma. Evaluation of the head and neck ?limited by motion artifact. ?3. Hypermetabolic lesions inferiorly in the right hepatic lobe and ?in the proximal right femoral diaphysis suspicious for metastatic ?disease, primary uncertain in this patient with a history of ?prostate cancer. Correlate with PSA levels. Abdominal MRI without ?and with contrast may be helpful for further characterization of the ?liver lesion. ? ?# 2007MI-CAD [s/p stent-Dr.Berry; EF 2021- 58%] ?  ?  ?Primary adenocarcinoma of upper lobe of right lung ([place])  ?10/18/2021 Initial Diagnosis  ? Primary adenocarcinoma of upper lobe of right lung ([place]) ? ?  ?10/18/2021 Cancer Staging  ? Staging form: Lung, AJCC 8th Edition ?- Clinical: Stage IVB (cT1c, cN0, cM1c) - Signed by Curt Bears, MD on 10/18/2021 ? ?  ?11/01/2021 -  Chemotherapy  ? Patient is on Treatment Plan : LUNG NSCLC Pemetrexed + Carboplatin q21d x 4 Cycles  ? ?  ?  ? ? ? ?HISTORY OF PRESENTING ILLNESS: Ambulating independently.  Accompanied by his wife.  ? ?BAO BAZEN 61 y.o.  male metastatic stage IV non-small cell lung cancer/favor adenocarcinoma-currently on maintained libatyo here for follow-up/ review CT scans.  ? ?Patient continues to complain of fatigue.  Denies any fever chills.  No nausea no vomiting.  Denies any diarrhea.  No worsening skin rash. ? ?Review of Systems  ?Constitutional:  Positive for malaise/fatigue. Negative for chills, diaphoresis, fever and weight loss.  ?HENT:  Positive for hearing loss. Negative for nosebleeds and sore throat.   ?Eyes:  Negative for double vision.  ?Respiratory:  Negative for  cough, hemoptysis, sputum production, shortness of breath and wheezing.   ?Cardiovascular:  Negative for chest pain, palpitations, orthopnea and leg swelling.   ?Gastrointestinal:  Negative for abdominal pain, blood in stool, diarrhea, heartburn, melena, nausea and vomiting.  ?Genitourinary:  Negative for dysuria, frequency and urgency.  ?Musculoskeletal:  Negative for back pain.  ?Skin: Negative.  Negative for itching and rash.  ?Neurological:  Negative for dizziness, tingling, focal weakness, weakness and headaches.  ?Endo/Heme/Allergies:  Does not bruise/bleed easily.  ?Psychiatric/Behavioral:  Negative for depression. The patient does not have insomnia.    ? ?MEDICAL HISTORY:  ?Past Medical History:  ?Diagnosis Date  ? Anxiety   ? associated with medical care,  worsened by difficulty hearing, does better with wife present  ? Complication of anesthesia   ? wife states very anxious may need pre sedation  ? Coronary artery disease   ? GERD (gastroesophageal reflux disease)   ? Hearing loss   ? mild per wife  ? Hearing loss   ? no hearing aids per wife  ? Hyperlipidemia   ? Hypertension   ? MI (myocardial infarction) ([place]) 04/17/2006  ? Acute inferolateral wall MI with cardiogenic shock and complete heart block  ? Primary adenocarcinoma of upper lobe of right lung ([place])   ? Prostate cancer ([place])   ? Tobacco abuse   ? Wears glasses   ? Wears partial dentures   ? Upper  ? ? ?SURGICAL HISTORY: ?Past Surgical History:  ?Procedure Laterality Date  ? BRONCHIAL BIOPSY  10/09/2021  ? Procedure: BRONCHIAL BIOPSIES;  Surgeon: Collene Gobble, MD;  Location: [facility] ENDOSCOPY;  Service: Pulmonary;;  ? BRONCHIAL BRUSHINGS  10/09/2021  ? Procedure: BRONCHIAL BRUSHINGS;  Surgeon: Collene Gobble, MD;  Location: [facility] ENDOSCOPY;  Service: Pulmonary;;  ? BRONCHIAL NEEDLE ASPIRATION BIOPSY  10/09/2021  ? Procedure: BRONCHIAL NEEDLE ASPIRATION BIOPSIES;  Surgeon: Collene Gobble, MD;  Location: [facility] ENDOSCOPY;  Service: Pulmonary;;  ? CARDIAC CATHETERIZATION  2007  ? left, RCA 100% occluded ruptured plaque with thrombus in the proximal segment  ? CYST EXCISION N/A 08/30/2021  ? Procedure: EXCISION OF  POSTERIOR SCALP CYST;  Surgeon: Clovis Riley, MD;  Location: [facility] ORS;  Service: General;  Laterality: N/A;  ? CYSTOSCOPY N/A 07/17/2018  ? Procedure: CYSTOSCOPY;  Surgeon: Franchot Gallo, MD;  Location: [facility];  Service: Urology;  Laterality: N/A;  no seeds in bladder per Dr Diona Fanti  ? FIDUCIAL MARKER PLACEMENT  10/09/2021  ? Procedure: FIDUCIAL MARKER PLACEMENT;  Surgeon: Collene Gobble, MD;  Location: [facility] ENDOSCOPY;  Service: Pulmonary;;  ? HAND SURGERY Right   ? has metal plate in arm  ? PROSTATE BIOPSY    ? RADIOACTIVE SEED IMPLANT N/A 07/17/2018  ? Procedure: RADIOACTIVE SEED IMPLANT/BRACHYTHERAPY IMPLANT;  Surgeon: Franchot Gallo, MD;  Location: [facility];  Service: Urology;  Laterality: N/A;  77 seeds  ? SPACE OAR INSTILLATION N/A 07/17/2018  ? Procedure: SPACE OAR INSTILLATION;  Surgeon: Franchot Gallo, MD;  Location: [facility];  Service: Urology;  Laterality: N/A;  ? VIDEO BRONCHOSCOPY WITH RADIAL ENDOBRONCHIAL ULTRASOUND  10/09/2021  ? Procedure: VIDEO BRONCHOSCOPY WITH RADIAL ENDOBRONCHIAL ULTRASOUND;  Surgeon: Collene Gobble, MD;  Location: [facility] ENDOSCOPY;  Service: Pulmonary;;  ? WRIST SURGERY  10/04/2012  ? ? ?SOCIAL HISTORY: ?Social History  ? ?Socioeconomic History  ? Marital status: Married  ?  Spouse name: Not on file  ? Number of children: Not on file  ? Years of  education: Not on file  ? Highest education level: Not on file  ?Occupational History  ? Not on file  ?Tobacco Use  ? Smoking status: Every Day  ?  Packs/day: 1.50  ?  Years: 42.00  ?  Pack years: 63.00  ?  Types: Cigarettes  ? Smokeless tobacco: Never  ?Vaping Use  ? Vaping Use: Never used  ?Substance and Sexual Activity  ? Alcohol use: No  ? Drug use: No  ? Sexual activity: Yes  ?  Birth control/protection: None  ?Other Topics Concern  ? Not on file  ?Social History Narrative  ? 10-04 19 Unable to ask abuse questions wife with him today.  ? ?Social Determinants of  Health  ? ?Financial Resource Strain: Not on file  ?Food Insecurity: Not on file  ?Transportation Needs: Not on file  ?Physical Activity: Not on file  ?Stress: Not on file  ?Social Connections: Not on file  ?Intimate P

## 2022-02-21 NOTE — Assessment & Plan Note (Addendum)
#   Non-small cell lung cancer/stage IV-favor adeno carcinoma-metastases to right femur/liver; synchronous squamous cell carcinoma scalp. Currently carbo-Alimta-Libtayo [cycle #4];APRIL 19th, 2023-  No significant change in a spiculated nodule of the posterior ?right pulmonary apex, in keeping with treated primary lung malignancy.  Unchanged, very subtle hypodense lesion of hepatic segment V, ?consistent with a metastasis better characterized by prior PET-CT and MRI. 3. No evidence of lymphadenopathy or new metastatic disease in the chest, abdomen, or pelvis. ? ?# Proceed with  Single agent Libtayo maintenance- Labs today reviewed;  acceptable for treatment today. Labs today reviewed;  acceptable for treatment today. Will again repeat scans in 3 months.  ? ?# SBRT of the right upper lobe lung lesion- s/p SBRT; Right proximal femoral lesion-symptomatic s/p radiation--STABLE.  ? 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  Might need to consider radiation-if any local progression noted/also placed on course lung cancer.  STABLE; ? ?# Anxiety-[Josh] on Zoloft/ zanax prn-STABLE; ? ?# GRED- on prilosec;  ? ?# IV access: PIV ? ?#Incidental findings on Imaging  CT scan april, 2023:4. Emphysema; Coronary artery disease; Aortic Atherosclerosis (ICD10-I70.0) and Emphysema (ICD10-J43.9). I reviewed/discussed/counseled the patient.  ? ?# DISPOSITION: ?# chemo today;  ?# follow up in 3 weeks- MD;labs- cbc/cmp; Libtayo; - Dr.B ? ?# I reviewed the blood work- with the patient in detail; also reviewed the imaging independently [as summarized above]; and with the patient in detail.  ? ? ? ? ? ? ?

## 2022-02-21 NOTE — Telephone Encounter (Signed)
Pt's wife is asking if ondansetron given prior to libtayo can be held for his next treatment. States that each time he has received it, he has felt bad at home.  ? ?Please advise.  ?

## 2022-02-22 ENCOUNTER — Inpatient Hospital Stay (HOSPITAL_BASED_OUTPATIENT_CLINIC_OR_DEPARTMENT_OTHER): Payer: BC Managed Care – PPO | Admitting: Hospice and Palliative Medicine

## 2022-02-22 DIAGNOSIS — Z515 Encounter for palliative care: Secondary | ICD-10-CM

## 2022-02-22 NOTE — Progress Notes (Signed)
Voicemail left.  Will reschedule ?

## 2022-02-23 ENCOUNTER — Encounter: Payer: Self-pay | Admitting: *Deleted

## 2022-03-02 ENCOUNTER — Encounter: Payer: Self-pay | Admitting: *Deleted

## 2022-03-03 ENCOUNTER — Other Ambulatory Visit: Payer: Self-pay | Admitting: Cardiovascular Disease

## 2022-03-05 DIAGNOSIS — J3489 Other specified disorders of nose and nasal sinuses: Secondary | ICD-10-CM | POA: Diagnosis not present

## 2022-03-05 DIAGNOSIS — Z08 Encounter for follow-up examination after completed treatment for malignant neoplasm: Secondary | ICD-10-CM | POA: Diagnosis not present

## 2022-03-05 DIAGNOSIS — H6981 Other specified disorders of Eustachian tube, right ear: Secondary | ICD-10-CM | POA: Diagnosis not present

## 2022-03-05 DIAGNOSIS — R0683 Snoring: Secondary | ICD-10-CM | POA: Diagnosis not present

## 2022-03-09 ENCOUNTER — Encounter: Payer: Self-pay | Admitting: *Deleted

## 2022-03-12 ENCOUNTER — Other Ambulatory Visit: Payer: Self-pay | Admitting: *Deleted

## 2022-03-12 MED ORDER — SERTRALINE HCL 50 MG PO TABS
50.0000 mg | ORAL_TABLET | Freq: Every day | ORAL | 0 refills | Status: DC
Start: 2022-03-12 — End: 2022-03-14

## 2022-03-13 ENCOUNTER — Encounter: Payer: Self-pay | Admitting: *Deleted

## 2022-03-14 ENCOUNTER — Inpatient Hospital Stay: Payer: BC Managed Care – PPO | Attending: Internal Medicine

## 2022-03-14 ENCOUNTER — Encounter: Payer: Self-pay | Admitting: Internal Medicine

## 2022-03-14 ENCOUNTER — Inpatient Hospital Stay (HOSPITAL_BASED_OUTPATIENT_CLINIC_OR_DEPARTMENT_OTHER): Payer: BC Managed Care – PPO | Admitting: Internal Medicine

## 2022-03-14 ENCOUNTER — Other Ambulatory Visit: Payer: Self-pay | Admitting: Internal Medicine

## 2022-03-14 ENCOUNTER — Encounter: Payer: Self-pay | Admitting: Cardiovascular Disease

## 2022-03-14 ENCOUNTER — Inpatient Hospital Stay: Payer: BC Managed Care – PPO

## 2022-03-14 DIAGNOSIS — Z79899 Other long term (current) drug therapy: Secondary | ICD-10-CM | POA: Insufficient documentation

## 2022-03-14 DIAGNOSIS — K769 Liver disease, unspecified: Secondary | ICD-10-CM | POA: Insufficient documentation

## 2022-03-14 DIAGNOSIS — Z5111 Encounter for antineoplastic chemotherapy: Secondary | ICD-10-CM | POA: Diagnosis not present

## 2022-03-14 DIAGNOSIS — Z8546 Personal history of malignant neoplasm of prostate: Secondary | ICD-10-CM | POA: Insufficient documentation

## 2022-03-14 DIAGNOSIS — C3411 Malignant neoplasm of upper lobe, right bronchus or lung: Secondary | ICD-10-CM

## 2022-03-14 DIAGNOSIS — Z923 Personal history of irradiation: Secondary | ICD-10-CM | POA: Diagnosis not present

## 2022-03-14 DIAGNOSIS — C7951 Secondary malignant neoplasm of bone: Secondary | ICD-10-CM | POA: Insufficient documentation

## 2022-03-14 LAB — CBC WITH DIFFERENTIAL/PLATELET
Abs Immature Granulocytes: 0.02 10*3/uL (ref 0.00–0.07)
Basophils Absolute: 0 10*3/uL (ref 0.0–0.1)
Basophils Relative: 0 %
Eosinophils Absolute: 0.1 10*3/uL (ref 0.0–0.5)
Eosinophils Relative: 1 %
HCT: 47.3 % (ref 39.0–52.0)
Hemoglobin: 15.9 g/dL (ref 13.0–17.0)
Immature Granulocytes: 0 %
Lymphocytes Relative: 18 %
Lymphs Abs: 1.2 10*3/uL (ref 0.7–4.0)
MCH: 32.6 pg (ref 26.0–34.0)
MCHC: 33.6 g/dL (ref 30.0–36.0)
MCV: 96.9 fL (ref 80.0–100.0)
Monocytes Absolute: 1 10*3/uL (ref 0.1–1.0)
Monocytes Relative: 14 %
Neutro Abs: 4.4 10*3/uL (ref 1.7–7.7)
Neutrophils Relative %: 67 %
Platelets: 195 10*3/uL (ref 150–400)
RBC: 4.88 MIL/uL (ref 4.22–5.81)
RDW: 13.2 % (ref 11.5–15.5)
WBC: 6.6 10*3/uL (ref 4.0–10.5)
nRBC: 0 % (ref 0.0–0.2)

## 2022-03-14 LAB — COMPREHENSIVE METABOLIC PANEL
ALT: 24 U/L (ref 0–44)
AST: 18 U/L (ref 15–41)
Albumin: 4.3 g/dL (ref 3.5–5.0)
Alkaline Phosphatase: 81 U/L (ref 38–126)
Anion gap: 7 (ref 5–15)
BUN: 16 mg/dL (ref 6–20)
CO2: 26 mmol/L (ref 22–32)
Calcium: 8.9 mg/dL (ref 8.9–10.3)
Chloride: 104 mmol/L (ref 98–111)
Creatinine, Ser: 1.14 mg/dL (ref 0.61–1.24)
GFR, Estimated: >60 mL/min (ref >60)
Glucose, Bld: 112 mg/dL — ABNORMAL HIGH (ref 70–99)
Potassium: 4.1 mmol/L (ref 3.5–5.1)
Sodium: 137 mmol/L (ref 135–145)
Total Bilirubin: 0.5 mg/dL (ref 0.3–1.2)
Total Protein: 7.7 g/dL (ref 6.5–8.1)

## 2022-03-14 MED ORDER — SODIUM CHLORIDE 0.9 % IV SOLN
350.0000 mg | Freq: Once | INTRAVENOUS | Status: AC
  Administered 2022-03-14: 350 mg via INTRAVENOUS
  Filled 2022-03-14: qty 7

## 2022-03-14 MED ORDER — SERTRALINE HCL 50 MG PO TABS: 100.0000 mg | ORAL_TABLET | Freq: Every day | ORAL | 0 refills | Status: DC

## 2022-03-14 MED ORDER — SODIUM CHLORIDE 0.9 % IV SOLN
Freq: Once | INTRAVENOUS | Status: AC
  Filled 2022-03-14: qty 250

## 2022-03-14 NOTE — Patient Instructions (Signed)
[facility] CANCER CTR AT [place]  Discharge Instructions: ?Thank you for choosing [facility] to provide your oncology and hematology care.  ?If you have a lab appointment with the [facility], please go directly to the [facility] and check in at the registration area. ? ?Wear comfortable clothing and clothing appropriate for easy access to any Portacath or PICC line.  ? ?We strive to give you quality time with your provider. You may need to reschedule your appointment if you arrive late (15 or more minutes).  Arriving late affects you and other patients whose appointments are after yours.  Also, if you miss three or more appointments without notifying the office, you may be dismissed from the clinic at the provider?s discretion.    ?  ?For prescription refill requests, have your pharmacy contact our office and allow 72 hours for refills to be completed.   ? ?Today you received the following chemotherapy and/or immunotherapy agents libtayo ?  ?To help prevent nausea and vomiting after your treatment, we encourage you to take your nausea medication as directed. ? ?BELOW ARE SYMPTOMS THAT SHOULD BE REPORTED IMMEDIATELY: ?*FEVER GREATER THAN 100.4 F (38 ?C) OR HIGHER ?*CHILLS OR SWEATING ?*NAUSEA AND VOMITING THAT IS NOT CONTROLLED WITH YOUR NAUSEA MEDICATION ?*UNUSUAL SHORTNESS OF BREATH ?*UNUSUAL BRUISING OR BLEEDING ?*URINARY PROBLEMS (pain or burning when urinating, or frequent urination) ?*BOWEL PROBLEMS (unusual diarrhea, constipation, pain near the anus) ?TENDERNESS IN MOUTH AND THROAT WITH OR WITHOUT PRESENCE OF ULCERS (sore throat, sores in mouth, or a toothache) ?UNUSUAL RASH, SWELLING OR PAIN  ?UNUSUAL VAGINAL DISCHARGE OR ITCHING  ? ?Items with * indicate a potential emergency and should be followed up as soon as possible or go to the Emergency Department if any problems should occur. ? ?Please show the CHEMOTHERAPY ALERT CARD or IMMUNOTHERAPY ALERT CARD at check-in to the  Emergency Department and triage nurse. ? ?Should you have questions after your visit or need to cancel or reschedule your appointment, please contact [facility] CANC[facility] [place] AT [place]  [phone] and follow the prompts.  Office hours are 8:00 a.m. to 4:30 p.m. Monday - Friday. Please note that voicemails left after 4:00 p.m. may not be returned until the following business day.  We are closed weekends and major holidays. You have access to a nurse at all times for urgent questions. Please call the main number to the clinic [phone] and follow the prompts. ? ?For any non-urgent questions, you may also contact your provider using MyChart. We now offer e-Visits for anyone 49 and older to request care online for non-urgent symptoms. For details visit [email or web address]. ?  ?Also download the MyChart app! Go to the app store, search "MyChart", open the app, select [facility], and log in with your MyChart username and password. ? ?Due to Covid, a mask is required upon entering the hospital/clinic. If you do not have a mask, one will be given to you upon arrival. For doctor visits, patients may have 1 support person aged 10 or older with them. For treatment visits, patients cannot have anyone with them due to current Covid guidelines and our immunocompromised population.  ?

## 2022-03-14 NOTE — Assessment & Plan Note (Addendum)
#   Non-small cell lung cancer/stage IV-favor adeno carcinoma-metastases to right femur/liver; synchronous squamous cell carcinoma scalp. S/p  carbo-Alimta-Libtayo [cycle #4]; APRIL 19th, 2023-  No significant change in a spiculated nodule of the posterior right pulmonary apex, in keeping with treated primary lung malignancy.  Unchanged, very subtle hypodense lesion in the liver.  No evidence of lymphadenopathy or new metastatic disease in the chest, abdomen, or pelvis. Currently on maintained Libatayo.  ? ?# Proceed with  Single agent Libtayo maintenance- Labs today reviewed;  acceptable for treatment today.  Again discussed the prognosis -the median survival is about 2 years.  However the disease is still incurable. ? ?# SBRT of the right upper lobe lung lesion- s/p SBRT; Right proximal femoral lesion-symptomatic s/p radiation--STABLE.  ? 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  Might need to consider radiation-if any local progression noted/also placed on course lung cancer.  STABLE; ? ?# Ear infection: s/p ENT Eval [Dr.J]- on prednisone taper [30 mg/day].  ? ?# Anxiety-[Josh] poorly controlled increase  Zoloft to 100 mg/ zanax prn-discussed regarding referral to ENT.  Declines. ? ?# Fatigue: recommend sleep study/as per ENT. ? ?# GERD- on prilosec;  ? ?# IV access: PIV ? ?# DISPOSITION: ?# treatment  today;  ?# follow up in 3 weeks- MD;labs- cbc/cmp; TSH- Libtayo; - Dr.B ? ? ? ? ? ? ?

## 2022-03-14 NOTE — Telephone Encounter (Signed)
New rx request send to MD for review. ?

## 2022-03-14 NOTE — Progress Notes (Signed)
[facility] ?CONSULT NOTE ? ?Patient Care Team: ?Casilda Carls, MD as PCP - General (Internal Medicine) ?Lorretta Harp, MD as PCP - Cardiology (Cardiology) ?Curt Bears, MD as Consulting Physician (Oncology) ?Casilda Carls, MD as Referring Physician (Internal Medicine) ?Cammie Sickle, MD as Consulting Physician (Internal Medicine) ?Cammie Sickle, MD as Consulting Physician (Internal Medicine) ? ?CHIEF COMPLAINTS/PURPOSE OF CONSULTATION: lung cancer ? ?#  ?Oncology History Overview Note  ?DIAGNOSIS: ?1) stage IV (T1c, N0, M1 C) non-small cell lung cancer favoring adenocarcinoma presented with right lung apical nodule in addition to metastatic disease in the right hepatic lobe and the proximal right femoral diaphysis diagnosed and December 2022. 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 ? ?QNS; Guardant 360 showed positive KRAS G12C mutation ? ?FINAL MICROSCOPIC DIAGNOSIS:  ? ?A. LUNG, RUL, FINE NEEDLE ASPIRATION:  ?- Malignant cells consistent with non-small cell carcinoma, see comment  ? ?B. LUNG, RUL, BRUSHING:  ?- Malignant cells consistent with non-small cell carcinoma, see comment  ? ? ? ? ? ?COMMENT:  ? ?A and B.  Dr. Saralyn Pilar reviewed the case and concurs with the diagnosis.  ?Only rare malignant cells are present on the cellblock.  ?Immunohistochemical stains were attempted and show that the tumor cells  ?have patchy staining for TTF-1 whereas p63, p40 and CK5/6 are negative.  ?The findings are nondiagnostic but suggestive of a lung adenocarcinoma.  ?Dr. Lamonte Sakai was notified on 10/13/2021.  ? ?SEP-OCT 2022-  [Dermatology]-   Poorly differentiated squamous cell carcinoma; -  Carcinoma extends to the edges of the excision specimen  ? ?IMPRESSION: ?1. The spiculated nodule at the right lung apex on recent neck CT is ?hypermetabolic and is concerning for primary bronchogenic  carcinoma. ?Tissue sampling recommended. ?2. Hypermetabolic activity within the occipital scalp and small ?previously demonstrated right occipital lymph node compatible with ?known squamous cell carcinoma. Evaluation of the head and neck ?limited by motion artifact. ?3. Hypermetabolic lesions inferiorly in the right hepatic lobe and ?in the proximal right femoral diaphysis suspicious for metastatic ?disease, primary uncertain in this patient with a history of ?prostate cancer. Correlate with PSA levels. Abdominal MRI without ?and with contrast may be helpful for further characterization of the ?liver lesion. ? ?# 2007MI-CAD [s/p stent-Dr.Berry; EF 2021- 58%] ?  ?  ?Primary adenocarcinoma of upper lobe of right lung ([place])  ?10/18/2021 Initial Diagnosis  ? Primary adenocarcinoma of upper lobe of right lung ([place]) ? ?  ?10/18/2021 Cancer Staging  ? Staging form: Lung, AJCC 8th Edition ?- Clinical: Stage IVB (cT1c, cN0, cM1c) - Signed by Curt Bears, MD on 10/18/2021 ? ?  ?11/01/2021 -  Chemotherapy  ? Patient is on Treatment Plan : LUNG NSCLC Pemetrexed + Carboplatin q21d x 4 Cycles  ? ?   ? ? ? ?HISTORY OF PRESENTING ILLNESS: Ambulating independently.  Accompanied by his wife.  ? ?Chris [name] 61 y.o.  male metastatic stage IV non-small cell lung cancer/favor adenocarcinoma-currently on maintained libatyo here for follow-up. ? ?Patient noted to have ear infection right-sided s/p evaluation with ENT. Tapering down on  prednisone 10 mg.  He is currently awaiting reevaluation with ENT-next week. ?  ?Patient continues to complain of fatigue.  Denies any fever chills.  No nausea no vomiting.  Denies any diarrhea.  No worsening skin rash.  Complains of anxiety. ? ?Review of Systems  ?Constitutional:  Positive for malaise/fatigue. Negative for chills, diaphoresis, fever and  weight loss.  ?HENT:  Positive for hearing loss. Negative for nosebleeds and sore throat.   ?Eyes:  Negative for double vision.  ?Respiratory:   Negative for cough, hemoptysis, sputum production, shortness of breath and wheezing.   ?Cardiovascular:  Negative for chest pain, palpitations, orthopnea and leg swelling.  ?Gastrointestinal:  Negative for abdominal pain, blood in stool, diarrhea, heartburn, melena, nausea and vomiting.  ?Genitourinary:  Negative for dysuria, frequency and urgency.  ?Musculoskeletal:  Negative for back pain.  ?Skin: Negative.  Negative for itching and rash.  ?Neurological:  Negative for dizziness, tingling, focal weakness, weakness and headaches.  ?Endo/Heme/Allergies:  Does not bruise/bleed easily.  ?Psychiatric/Behavioral:  Negative for depression. The patient does not have insomnia.    ? ?MEDICAL HISTORY:  ?Past Medical History:  ?Diagnosis Date  ?? Anxiety   ? associated with medical care,  worsened by difficulty hearing, does better with wife present  ?? Complication of anesthesia   ? wife states very anxious may need pre sedation  ?? Coronary artery disease   ?? GERD (gastroesophageal reflux disease)   ?? Hearing loss   ? mild per wife  ?? Hearing loss   ? no hearing aids per wife  ?? Hyperlipidemia   ?? Hypertension   ?? MI (myocardial infarction) ([place]) 04/17/2006  ? Acute inferolateral wall MI with cardiogenic shock and complete heart block  ?? Primary adenocarcinoma of upper lobe of right lung ([place])   ?? Prostate cancer ([place])   ?? Tobacco abuse   ?? Wears glasses   ?? Wears partial dentures   ? Upper  ? ? ?SURGICAL HISTORY: ?Past Surgical History:  ?Procedure Laterality Date  ?? BRONCHIAL BIOPSY  10/09/2021  ? Procedure: BRONCHIAL BIOPSIES;  Surgeon: Collene Gobble, MD;  Location: [facility] ENDOSCOPY;  Service: Pulmonary;;  ?? BRONCHIAL BRUSHINGS  10/09/2021  ? Procedure: BRONCHIAL BRUSHINGS;  Surgeon: Collene Gobble, MD;  Location: [facility] ENDOSCOPY;  Service: Pulmonary;;  ?? BRONCHIAL NEEDLE ASPIRATION BIOPSY  10/09/2021  ? Procedure: BRONCHIAL NEEDLE ASPIRATION BIOPSIES;  Surgeon: Collene Gobble, MD;  Location: [facility] ENDOSCOPY;   Service: Pulmonary;;  ?? CARDIAC CATHETERIZATION  2007  ? left, RCA 100% occluded ruptured plaque with thrombus in the proximal segment  ?? CYST EXCISION N/A 08/30/2021  ? Procedure: EXCISION OF POSTERIOR SCALP CYST;  Surgeon: Clovis Riley, MD;  Location: [facility];  Service: General;  Laterality: N/A;  ?? CYSTOSCOPY N/A 07/17/2018  ? Procedure: CYSTOSCOPY;  Surgeon: Franchot Gallo, MD;  Location[facility];  Service: Urology;  Laterality: N/A;  no seeds in bladder per Dr Diona Fanti  ?? FIDUCIAL MARKER PLACEMENT  10/09/2021  ? Procedure: FIDUCIAL MARKER PLACEMENT;  Surgeon: Collene Gobble, MD;  Location: [facility] ENDOSCOPY;  Service: Pulmonary;;  ?? HAND SURGERY Right   ? has metal plate in arm  ?? PROSTATE BIOPSY    ?? RADIOACTIVE SEED IMPLANT N/A 07/17/2018  ? Procedure: RADIOACTIVE SEED IMPLANT/BRACHYTHERAPY IMPLANT;  Surgeon: Franchot Gallo, MD;  Location: [facility];  Service: Urology;  Laterality: N/A;  77 seeds  ?? SPACE OAR INSTILLATION N/A 07/17/2018  ? Procedure: SPACE OAR INSTILLATION;  Surgeon: Franchot Gallo, MD;  Location: [facility];  Service: Urology;  Laterality: N/A;  ?? VIDEO BRONCHOSCOPY WITH RADIAL ENDOBRONCHIAL ULTRASOUND  10/09/2021  ? Procedure: VIDEO BRONCHOSCOPY WITH RADIAL ENDOBRONCHIAL ULTRASOUND;  Surgeon: Collene Gobble, MD;  Location: [facility] ENDOSCOPY;  Service: Pulmonary;;  ?? WRIST SURGERY  10/04/2012  ? ? ?SOCIAL HISTORY: ?Social History  ? ?  Socioeconomic History  ?? Marital status: Married  ?  Spouse name: Not on file  ?? Number of children: Not on file  ?? Years of education: Not on file  ?? Highest education level: Not on file  ?Occupational History  ?? Not on file  ?Tobacco Use  ?? Smoking status: Every Day  ?  Packs/day: 1.50  ?  Years: 42.00  ?  Pack years: 63.00  ?  Types: Cigarettes  ?? Smokeless tobacco: Never  ?Vaping Use  ?? Vaping Use: Never used  ?Substance and Sexual Activity  ?? Alcohol use: No  ?? Drug use: No  ??  Sexual activity: Yes  ?  Birth control/protection: None  ?Other Topics Concern  ?? Not on file  ?Social History Narrative  ? 10-04 19 Unable to ask abuse questions wife with him today.  ? ?Social Determinants of [place]

## 2022-03-14 NOTE — Telephone Encounter (Signed)
Message received from patient that insurance will not pay for Sertraline 50 mg 2 tabs QD.   ? ?Replacing Sertraline 50 mg 2 QD previously sent ?

## 2022-03-14 NOTE — Progress Notes (Signed)
Feeling more tired x 2 weeks. ? ?Tapering down on  prednisone 10 mg. ? ?C/o having some uncontrolled Bms. ? ?Would like to discuss possible side effects to look for with his type of cancer. ? ?Would like to discuss his life expectancy as well. ?

## 2022-03-18 DIAGNOSIS — G4730 Sleep apnea, unspecified: Secondary | ICD-10-CM | POA: Diagnosis not present

## 2022-03-19 DIAGNOSIS — H6981 Other specified disorders of Eustachian tube, right ear: Secondary | ICD-10-CM | POA: Diagnosis not present

## 2022-03-19 DIAGNOSIS — H903 Sensorineural hearing loss, bilateral: Secondary | ICD-10-CM | POA: Diagnosis not present

## 2022-03-19 DIAGNOSIS — R0683 Snoring: Secondary | ICD-10-CM | POA: Diagnosis not present

## 2022-03-20 ENCOUNTER — Encounter: Payer: Self-pay | Admitting: Cardiovascular Disease

## 2022-03-20 ENCOUNTER — Ambulatory Visit (INDEPENDENT_AMBULATORY_CARE_PROVIDER_SITE_OTHER): Payer: BC Managed Care – PPO | Admitting: Cardiovascular Disease

## 2022-03-20 DIAGNOSIS — Z720 Tobacco use: Secondary | ICD-10-CM | POA: Diagnosis not present

## 2022-03-20 DIAGNOSIS — E782 Mixed hyperlipidemia: Secondary | ICD-10-CM

## 2022-03-20 DIAGNOSIS — I2510 Atherosclerotic heart disease of native coronary artery without angina pectoris: Secondary | ICD-10-CM

## 2022-03-20 MED ORDER — ATORVASTATIN CALCIUM 20 MG PO TABS: 20.0000 mg | ORAL_TABLET | Freq: Every day | ORAL | 3 refills | Status: DC

## 2022-03-20 MED ORDER — METOPROLOL TARTRATE 25 MG PO TABS: 25.0000 mg | ORAL_TABLET | Freq: Every day | ORAL | 3 refills | Status: DC

## 2022-03-20 MED ORDER — CLOPIDOGREL BISULFATE 75 MG PO TABS: 75.0000 mg | ORAL_TABLET | Freq: Every day | ORAL | 3 refills | Status: DC

## 2022-03-20 NOTE — Progress Notes (Signed)
03/20/2022 Chris [name]   Aug 19, 1961  [id number]  Primary Physician Casilda Carls, MD Primary Cardiologist: Lorretta Harp MD FACP, [place], [place], [place]  HPI:  Chris [name] is a 61 y.o.  mildly overweight married Caucasian male, father of 1 child, whom I saw the office 01/13/2021.Marland Kitchen  He is accompanied by his wife Chris [name] today.  He has a history of CAD, status post acute inferior wall myocardial infarction with RV infarct physiology on April 17, 2006. I took him to the cath lab at midnight that day and opened up an occluded RCA with overlapping Cypher drug-eluting stents. Circumflex and LAD were free of significant disease. His EF was 40% to 45% with moderate inferior hypokinesia. Subsequent echocardiogram revealed normal LV function with borderline inferior hypokinesia, and a Myoview performed  was not ischemic. His other problems include hyperlipidemia and continued tobacco abuse of  1/2 pack/day recalcitrant to risk factor modification..   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  He has gotten chemotherapy and immunotherapy.  He does continue to smoke.  He denies chest pain or shortness of breath..   Current Meds  Medication Sig   ALPRAZolam (XANAX) 0.25 MG tablet Take 1 tablet (0.25 mg total) by mouth at bedtime.   atorvastatin (LIPITOR) 20 MG tablet TAKE 1 TABLET BY MOUTH EVERY DAY   clopidogrel (PLAVIX) 75 MG tablet TAKE 1 TABLET (75 MG TOTAL) BY MOUTH DAILY. KEEP UPCOMING APPOINTMENT FOR FURTHER REFILLS.   folic acid (FOLVITE) 1 MG tablet Take 1 tablet (1 mg total) by mouth daily.   ibuprofen (ADVIL) 400 MG tablet Take 1 tablet (400 mg total) by mouth every 6 (six) hours as needed.   ketoconazole (NIZORAL) 2 % cream Apply 1 application. topically 2 (two) times daily as needed for irritation.   metoprolol tartrate (LOPRESSOR) 25 MG tablet Take 1 tablet (25 mg total) by mouth daily.    omeprazole (PRILOSEC OTC) 20 MG tablet Take 20 mg by mouth daily.   pantoprazole (PROTONIX) 40 MG tablet Take 1 tablet (40 mg total) by mouth daily.   prochlorperazine (COMPAZINE) 10 MG tablet Take 1 tablet (10 mg total) by mouth every 6 (six) hours as needed for nausea or vomiting.   sertraline (ZOLOFT) 100 MG tablet Take 1 tablet (100 mg total) by mouth daily. (Patient taking differently: Take 50 mg by mouth daily.)   tamsulosin (FLOMAX) 0.4 MG CAPS capsule Take 1 capsule (0.4 mg total) by mouth daily.     No Known Allergies  Social History   Socioeconomic History   Marital status: Married    Spouse name: Not on file   Number of children: Not on file   Years of education: Not on file   Highest education level: Not on file  Occupational History   Not on file  Tobacco Use   Smoking status: Every Day    Packs/day: 1.50    Years: 42.00    Pack years: 63.00    Types: Cigarettes   Smokeless tobacco: Never  Vaping Use   Vaping Use: Never used  Substance and Sexual Activity   Alcohol use: No   Drug use: No   Sexual activity: Yes    Birth control/protection: None  Other Topics Concern   Not on file  Social History Narrative   10-04 19 Unable to ask abuse questions wife with him today.  Social Determinants of Health   Financial Resource Strain: Not on file  Food Insecurity: Not on file  Transportation Needs: Not on file  Physical Activity: Not on file  Stress: Not on file  Social Connections: Not on file  Intimate Partner Violence: Not on file     Review of Systems: General: negative for chills, fever, night sweats or weight changes.  Cardiovascular: negative for chest pain, dyspnea on exertion, edema, orthopnea, palpitations, paroxysmal nocturnal dyspnea or shortness of breath Dermatological: negative for rash Respiratory: negative for cough or wheezing Urologic: negative for hematuria Abdominal: negative for nausea, vomiting, diarrhea, bright red blood per rectum,  melena, or hematemesis Neurologic: negative for visual changes, syncope, or dizziness All other systems reviewed and are otherwise negative except as noted above.    Blood pressure 130/76, pulse 61, height 5\' 10"  (1.778 m), weight 242 lb 12.8 oz (110.1 kg), SpO2 95 %.  General appearance: alert and no distress Neck: no adenopathy, no carotid bruit, no JVD, supple, symmetrical, trachea midline, and thyroid not enlarged, symmetric, no tenderness/mass/nodules Lungs: clear to auscultation bilaterally Heart: regular rate and rhythm, S1, S2 normal, no murmur, click, rub or gallop Extremities: extremities normal, atraumatic, no cyanosis or edema Pulses: 2+ and symmetric Skin: Skin color, texture, turgor normal. No rashes or lesions Neurologic: Grossly normal  EKG sinus rhythm at 61 with incomplete right bundle branch block unchanged from his prior EKG.  I personally reviewed this EKG.  ASSESSMENT AND PLAN:   Coronary artery disease History of CAD status post inferior STEMI with RV infarct physiology 04/17/2006.  I took him to the Cath Lab at midnight and opened up his occluded RCA with overlapping Cypher drug-eluting stents.  His LAD and circumflex were free of disease.  His EF was in the 40 to 45% range which ultimately normalized.  He denies chest pain or shortness of breath.  Hyperlipidemia History of hyperlipidemia on statin therapy followed by his PCP.  Tobacco abuse Ongoing tobacco abuse recalcitrant to respect modification despite a recent diagnosis of metastatic small cell lung CA.     Lorretta Harp MD FACP,FACC,FAHA, [facility] 03/20/2022 9:05 AM

## 2022-03-20 NOTE — Assessment & Plan Note (Signed)
History of hyperlipidemia on statin therapy followed by his PCP 

## 2022-03-20 NOTE — Patient Instructions (Signed)
Medication Instructions:  Your physician recommends that you continue on your current medications as directed. Please refer to the Current Medication list given to you today.  *If you need a refill on your cardiac medications before your next appointment, please call your pharmacy*   Lab Work: Your physician recommends that you return for lab work in: drawn at [facility] lipid/liver profile.  If you have labs (blood work) drawn today and your tests are completely normal, you will receive your results only by: [facility] (if you have MyChart) OR A paper copy in the mail If you have any lab test that is abnormal or we need to change your treatment, we will call you to review the results.   Follow-Up: At [facility], you and your health needs are our priority.  As part of our continuing mission to provide you with exceptional heart care, we have created designated Provider Care Teams.  These Care Teams include your primary Cardiologist (physician) and Advanced Practice Providers (APPs -  Physician Assistants and Nurse Practitioners) who all work together to provide you with the care you need, when you need it.  We recommend signing up for the patient portal called "MyChart".  Sign up information is provided on this After Visit Summary.  MyChart is used to connect with patients for Virtual Visits (Telemedicine).  Patients are able to view lab/test results, encounter notes, upcoming appointments, etc.  Non-urgent messages can be sent to your provider as well.   To learn more about what you can do with MyChart, go to [email or web address].    Your next appointment:   6 month(s)  The format for your next appointment:   In Person  Provider:   Quay Burow, MD

## 2022-03-20 NOTE — Assessment & Plan Note (Signed)
Ongoing tobacco abuse recalcitrant to respect modification despite a recent diagnosis of metastatic small cell lung CA.

## 2022-03-20 NOTE — Assessment & Plan Note (Signed)
History of CAD status post inferior STEMI with RV infarct physiology 04/17/2006.  I took him to the Cath Lab at midnight and opened up his occluded RCA with overlapping Cypher drug-eluting stents.  His LAD and circumflex were free of disease.  His EF was in the 40 to 45% range which ultimately normalized.  He denies chest pain or shortness of breath.

## 2022-03-28 ENCOUNTER — Encounter: Payer: Self-pay | Admitting: Internal Medicine

## 2022-04-02 ENCOUNTER — Encounter: Payer: Self-pay | Admitting: Hospice and Palliative Medicine

## 2022-04-03 ENCOUNTER — Encounter: Payer: Self-pay | Admitting: Internal Medicine

## 2022-04-03 ENCOUNTER — Other Ambulatory Visit: Payer: Self-pay | Admitting: *Deleted

## 2022-04-03 ENCOUNTER — Encounter: Payer: Self-pay | Admitting: *Deleted

## 2022-04-03 MED ORDER — ALPRAZOLAM 0.25 MG PO TABS: 0.2500 mg | ORAL_TABLET | Freq: Every day | ORAL | 0 refills | Status: DC

## 2022-04-03 MED ORDER — IBUPROFEN 400 MG PO TABS: 400.0000 mg | ORAL_TABLET | Freq: Four times a day (QID) | ORAL | 2 refills | Status: DC | PRN

## 2022-04-04 ENCOUNTER — Encounter: Payer: Self-pay | Admitting: Internal Medicine

## 2022-04-04 ENCOUNTER — Inpatient Hospital Stay: Payer: BC Managed Care – PPO | Admitting: Licensed Clinical Social Worker

## 2022-04-04 ENCOUNTER — Inpatient Hospital Stay (HOSPITAL_BASED_OUTPATIENT_CLINIC_OR_DEPARTMENT_OTHER): Payer: BC Managed Care – PPO | Admitting: Internal Medicine

## 2022-04-04 ENCOUNTER — Inpatient Hospital Stay: Payer: BC Managed Care – PPO | Attending: Internal Medicine

## 2022-04-04 ENCOUNTER — Inpatient Hospital Stay: Payer: BC Managed Care – PPO

## 2022-04-04 ENCOUNTER — Inpatient Hospital Stay (HOSPITAL_BASED_OUTPATIENT_CLINIC_OR_DEPARTMENT_OTHER): Payer: BC Managed Care – PPO | Admitting: Hospice and Palliative Medicine

## 2022-04-04 ENCOUNTER — Encounter: Payer: Self-pay | Admitting: Cardiovascular Disease

## 2022-04-04 DIAGNOSIS — Z5111 Encounter for antineoplastic chemotherapy: Secondary | ICD-10-CM | POA: Diagnosis not present

## 2022-04-04 DIAGNOSIS — K219 Gastro-esophageal reflux disease without esophagitis: Secondary | ICD-10-CM | POA: Insufficient documentation

## 2022-04-04 DIAGNOSIS — C7951 Secondary malignant neoplasm of bone: Secondary | ICD-10-CM | POA: Diagnosis not present

## 2022-04-04 DIAGNOSIS — G4733 Obstructive sleep apnea (adult) (pediatric): Secondary | ICD-10-CM | POA: Diagnosis not present

## 2022-04-04 DIAGNOSIS — Z79899 Other long term (current) drug therapy: Secondary | ICD-10-CM | POA: Diagnosis not present

## 2022-04-04 DIAGNOSIS — C3411 Malignant neoplasm of upper lobe, right bronchus or lung: Secondary | ICD-10-CM | POA: Insufficient documentation

## 2022-04-04 DIAGNOSIS — Z923 Personal history of irradiation: Secondary | ICD-10-CM | POA: Diagnosis not present

## 2022-04-04 LAB — CBC WITH DIFFERENTIAL/PLATELET
Abs Immature Granulocytes: 0.01 10*3/uL (ref 0.00–0.07)
Basophils Absolute: 0 10*3/uL (ref 0.0–0.1)
Basophils Relative: 0 %
Eosinophils Absolute: 0.1 10*3/uL (ref 0.0–0.5)
Eosinophils Relative: 2 %
HCT: 44.6 % (ref 39.0–52.0)
Hemoglobin: 15.1 g/dL (ref 13.0–17.0)
Immature Granulocytes: 0 %
Lymphocytes Relative: 23 %
Lymphs Abs: 1 10*3/uL (ref 0.7–4.0)
MCH: 32.4 pg (ref 26.0–34.0)
MCHC: 33.9 g/dL (ref 30.0–36.0)
MCV: 95.7 fL (ref 80.0–100.0)
Monocytes Absolute: 0.7 10*3/uL (ref 0.1–1.0)
Monocytes Relative: 16 %
Neutro Abs: 2.7 10*3/uL (ref 1.7–7.7)
Neutrophils Relative %: 59 %
Platelets: 196 10*3/uL (ref 150–400)
RBC: 4.66 MIL/uL (ref 4.22–5.81)
RDW: 12.7 % (ref 11.5–15.5)
WBC: 4.5 10*3/uL (ref 4.0–10.5)
nRBC: 0 % (ref 0.0–0.2)

## 2022-04-04 LAB — COMPREHENSIVE METABOLIC PANEL
ALT: 23 U/L (ref 0–44)
AST: 21 U/L (ref 15–41)
Albumin: 4 g/dL (ref 3.5–5.0)
Alkaline Phosphatase: 72 U/L (ref 38–126)
Anion gap: 6 (ref 5–15)
BUN: 18 mg/dL (ref 6–20)
CO2: 24 mmol/L (ref 22–32)
Calcium: 8.5 mg/dL — ABNORMAL LOW (ref 8.9–10.3)
Chloride: 107 mmol/L (ref 98–111)
Creatinine, Ser: 1.03 mg/dL (ref 0.61–1.24)
GFR, Estimated: >60 mL/min (ref >60)
Glucose, Bld: 104 mg/dL — ABNORMAL HIGH (ref 70–99)
Potassium: 3.8 mmol/L (ref 3.5–5.1)
Sodium: 137 mmol/L (ref 135–145)
Total Bilirubin: 0.5 mg/dL (ref 0.3–1.2)
Total Protein: 7.2 g/dL (ref 6.5–8.1)

## 2022-04-04 LAB — TSH: TSH: 1.425 u[IU]/mL (ref 0.350–4.500)

## 2022-04-04 LAB — LIPID PANEL
Cholesterol: 148 mg/dL (ref 0–200)
HDL: 39 mg/dL — ABNORMAL LOW (ref >40)
LDL Cholesterol: 75 mg/dL (ref 0–99)
Total CHOL/HDL Ratio: 3.8 RATIO
Triglycerides: 170 mg/dL — ABNORMAL HIGH (ref <150)
VLDL: 34 mg/dL (ref 0–40)

## 2022-04-04 MED ORDER — SODIUM CHLORIDE 0.9 % IV SOLN
Freq: Once | INTRAVENOUS | Status: AC
  Filled 2022-04-04: qty 250

## 2022-04-04 MED ORDER — SODIUM CHLORIDE 0.9 % IV SOLN
350.0000 mg | Freq: Once | INTRAVENOUS | Status: AC
  Administered 2022-04-04: 350 mg via INTRAVENOUS
  Filled 2022-04-04: qty 7

## 2022-04-04 NOTE — Patient Instructions (Signed)
[facility] [place]  Discharge Instructions: Thank you for choosing [facility] to provide your oncology and hematology care.  If you have a lab appointment with the [facility], please go directly to the [facility] and check in at the registration area.  Wear comfortable clothing and clothing appropriate for easy access to any Portacath or PICC line.   We strive to give you quality time with your provider. You may need to reschedule your appointment if you arrive late (15 or more minutes).  Arriving late affects you and other patients whose appointments are after yours.  Also, if you miss three or more appointments without notifying the office, you may be dismissed from the clinic at the provider's discretion.      For prescription refill requests, have your pharmacy contact our office and allow 72 hours for refills to be completed.    Today you received the following chemotherapy and/or immunotherapy agents Libtayo      To help prevent nausea and vomiting after your treatment, we encourage you to take your nausea medication as directed.  BELOW ARE SYMPTOMS THAT SHOULD BE REPORTED IMMEDIATELY: *FEVER GREATER THAN 100.4 F (38 C) OR HIGHER *CHILLS OR SWEATING *NAUSEA AND VOMITING THAT IS NOT CONTROLLED WITH YOUR NAUSEA MEDICATION *UNUSUAL SHORTNESS OF BREATH *UNUSUAL BRUISING OR BLEEDING *URINARY PROBLEMS (pain or burning when urinating, or frequent urination) *BOWEL PROBLEMS (unusual diarrhea, constipation, pain near the anus) TENDERNESS IN MOUTH AND THROAT WITH OR WITHOUT PRESENCE OF ULCERS (sore throat, sores in mouth, or a toothache) UNUSUAL RASH, SWELLING OR PAIN  UNUSUAL VAGINAL DISCHARGE OR ITCHING   Items with * indicate a potential emergency and should be followed up as soon as possible or go to the Emergency Department if any problems should occur.  Please show the CHEMOTHERAPY ALERT CARD or IMMUNOTHERAPY ALERT CARD at check-in to the  Emergency Department and triage nurse.  Should you have questions after your visit or need to cancel or reschedule your appointment, please contact [facility] [place] [facility] [place]  [phone] and follow the prompts.  Office hours are 8:00 a.m. to 4:30 p.m. Monday - Friday. Please note that voicemails left after 4:00 p.m. may not be returned until the following business day.  We are closed weekends and major holidays. You have access to a nurse at all times for urgent questions. Please call the main number to the clinic [phone] and follow the prompts.  For any non-urgent questions, you may also contact your provider using MyChart. We now offer e-Visits for anyone 61 and older to request care online for non-urgent symptoms. For details visit [email or web address].   Also download the MyChart app! Go to the app store, search "MyChart", open the app, select [facility], and log in with your MyChart username and password.  Due to Covid, a mask is required upon entering the hospital/clinic. If you do not have a mask, one will be given to you upon arrival. For doctor visits, patients may have 1 support person aged 61 or older with them. For treatment visits, patients cannot have anyone with them due to current Covid guidelines and our immunocompromised population.

## 2022-04-04 NOTE — Progress Notes (Signed)
[facility] CONSULT NOTE  Patient Care Team: Casilda Carls, MD as PCP - General (Internal Medicine) Lorretta Harp, MD as PCP - Cardiology (Cardiology) Curt Bears, MD as Consulting Physician (Oncology) Casilda Carls, MD as Referring Physician (Internal Medicine) Cammie Sickle, MD as Consulting Physician (Internal Medicine) Cammie Sickle, MD as Consulting Physician (Internal Medicine)  CHIEF COMPLAINTS/PURPOSE OF CONSULTATION: lung cancer  #  Oncology History Overview Note  DIAGNOSIS: 1) stage IV (T1c, N0, M1 C) non-small cell lung cancer favoring adenocarcinoma presented with right lung apical nodule in addition to metastatic disease in the right hepatic lobe and the proximal right femoral diaphysis diagnosed and December 2022. 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 LUNG, RUL, FINE NEEDLE ASPIRATION:  - Malignant cells consistent with non-small cell carcinoma, see comment   B. LUNG, RUL, BRUSHING:  - Malignant cells consistent with non-small cell carcinoma, see comment       COMMENT:   A and B.  Dr. Saralyn Pilar reviewed the case and concurs with the diagnosis.  Only rare malignant cells are present on the cellblock.  Immunohistochemical stains were attempted and show that the tumor cells  have patchy staining for TTF-1 whereas p63, p40 and CK5/6 are negative.  The findings are nondiagnostic but suggestive of a lung adenocarcinoma.  Dr. Lamonte Sakai was notified on 10/13/2021.   SEP-OCT 2022-  [Dermatology]-   Poorly differentiated squamous cell carcinoma; -  Carcinoma extends to the edges of the excision specimen   IMPRESSION: 1. The spiculated nodule at the right lung apex on recent neck CT is hypermetabolic and is concerning for primary bronchogenic  carcinoma. Tissue sampling recommended. 2. Hypermetabolic activity within the occipital scalp and small previously demonstrated right occipital lymph node compatible with known squamous cell carcinoma. Evaluation of the head and neck limited by motion artifact. 3. Hypermetabolic lesions inferiorly in the right hepatic lobe and in the proximal right femoral diaphysis suspicious for metastatic disease, primary uncertain in this patient with a history of prostate cancer. Correlate with PSA levels. Abdominal MRI without and with contrast may be helpful for further characterization of the liver lesion.  # 2007MI-CAD [s/p stent-Dr.Berry; EF 2021- 58%]     Primary adenocarcinoma of upper lobe of right lung ([place])  10/18/2021 Initial Diagnosis   Primary adenocarcinoma of upper lobe of right lung ([place])    10/18/2021 Cancer Staging   Staging form: Lung, AJCC 8th Edition - Clinical: Stage IVB (cT1c, cN0, cM1c) - Signed by Curt Bears, MD on 10/18/2021    11/01/2021 -  Chemotherapy   Patient is on Treatment Plan : LUNG NSCLC Pemetrexed + Carboplatin q21d x 4 Cycles       HISTORY OF PRESENTING ILLNESS: Ambulating independently.  Accompanied by his wife.   Chris [name] 61 y.o.  male metastatic stage IV non-small cell lung cancer/favor adenocarcinoma-currently on maintained libatyo here for follow-up.  In the interim patient had a sleep study for sleep apnea.  Patient had episode of diarrhea- 2-3 times day-2-3 days; no medications- currently resolved.   Continues to have hearing loss however s/p evaluation with ENT.  Patient continues to complain of fatigue.  Denies any fever chills.  No nausea no vomiting.   Review of Systems  Constitutional:  Positive for malaise/fatigue. Negative for chills, diaphoresis, fever and weight loss.  HENT:  Positive for hearing loss. Negative for nosebleeds and sore throat.   Eyes:  Negative for double vision.  Respiratory:  Negative for cough,  hemoptysis, sputum production, shortness of breath and wheezing.   Cardiovascular:  Negative for chest pain, palpitations, orthopnea and leg swelling.  Gastrointestinal:  Negative for abdominal pain, blood in stool, diarrhea, heartburn, melena, nausea and vomiting.  Genitourinary:  Negative for dysuria, frequency and urgency.  Musculoskeletal:  Negative for back pain.  Skin: Negative.  Negative for itching and rash.  Neurological:  Negative for dizziness, tingling, focal weakness, weakness and headaches.  Endo/Heme/Allergies:  Does not bruise/bleed easily.  Psychiatric/Behavioral:  Negative for depression. The patient does not have insomnia.     MEDICAL HISTORY:  Past Medical History:  Diagnosis Date   Anxiety    associated with medical care,  worsened by difficulty hearing, does better with wife present   Complication of anesthesia    wife states very anxious may need pre sedation   Coronary artery disease    GERD (gastroesophageal reflux disease)    Hearing loss    mild per wife   Hearing loss    no hearing aids per wife   Hyperlipidemia    Hypertension    MI (myocardial infarction) ([place]) 04/17/2006   Acute inferolateral wall MI with cardiogenic shock and complete heart block   Primary adenocarcinoma of upper lobe of right lung ([place])    Prostate cancer ([place])    Tobacco abuse    Wears glasses    Wears partial dentures    Upper    SURGICAL HISTORY: Past Surgical History:  Procedure Laterality Date   BRONCHIAL BIOPSY  10/09/2021   Procedure: BRONCHIAL BIOPSIES;  Surgeon: Collene Gobble, MD;  Location: [facility];  Service: Pulmonary;;   BRONCHIAL BRUSHINGS  10/09/2021   Procedure: BRONCHIAL BRUSHINGS;  Surgeon: Collene Gobble, MD;  Location: [facility] ENDOSCOPY;  Service: Pulmonary;;   BRONCHIAL NEEDLE ASPIRATION BIOPSY  10/09/2021   Procedure: BRONCHIAL NEEDLE ASPIRATION BIOPSIES;  Surgeon: Collene Gobble, MD;  Location: [facility] ENDOSCOPY;  Service: Pulmonary;;   CARDIAC  CATHETERIZATION  2007   left, RCA 100% occluded ruptured plaque with thrombus in the proximal segment   CYST EXCISION N/A 08/30/2021   Procedure: EXCISION OF POSTERIOR SCALP CYST;  Surgeon: Clovis Riley, MD;  Location: [facility];  Service: General;  Laterality: N/A;   CYSTOSCOPY N/A 07/17/2018   Procedure: Consuela Mimes;  Surgeon: Franchot Gallo, MD;  Location: [facility];  Service: Urology;  Laterality: N/A;  no seeds in bladder per Dr Diona Fanti   FIDUCIAL MARKER PLACEMENT  10/09/2021   Procedure: FIDUCIAL MARKER PLACEMENT;  Surgeon: Collene Gobble, MD;  [facility];  Service: Pulmonary;;   HAND SURGERY Right    has metal plate in arm   PROSTATE BIOPSY     RADIOACTIVE SEED IMPLANT N/A 07/17/2018   Procedure: RADIOACTIVE SEED IMPLANT/BRACHYTHERAPY IMPLANT;  Surgeon: Franchot Gallo, MD;  Location: [facility];  Service: Urology;  Laterality: N/A;  77 seeds   SPACE OAR INSTILLATION N/A 07/17/2018   Procedure: SPACE OAR INSTILLATION;  Surgeon: Franchot Gallo, MD;  Location: [facility];  Service: Urology;  Laterality: N/A;   VIDEO BRONCHOSCOPY WITH RADIAL ENDOBRONCHIAL ULTRASOUND  10/09/2021   Procedure: VIDEO BRONCHOSCOPY WITH RADIAL ENDOBRONCHIAL ULTRASOUND;  Surgeon: Collene Gobble, MD;  Location: [facility] ENDOSCOPY;  Service: Pulmonary;;   WRIST SURGERY  10/04/2012    SOCIAL HISTORY: Social History   Socioeconomic History  Marital status: Married    Spouse name: Not on file   Number of children: Not on file   Years of education: Not on file   Highest education level: Not on file  Occupational History   Not on file  Tobacco Use   Smoking status: Every Day    Packs/day: 1.50    Years: 42.00    Pack years: 63.00    Types: Cigarettes   Smokeless tobacco: Never  Vaping Use   Vaping Use: Never used  Substance and Sexual Activity   Alcohol use: No   Drug use: No   Sexual activity: Yes    Birth control/protection:  None  Other Topics Concern   Not on file  Social History Narrative   10-04 19 Unable to ask abuse questions wife with him today.   Social Determinants of Health   Financial Resource Strain: Not on file  Food Insecurity: Not on file  Transportation Needs: Not on file  Physical Activity: Not on file  Stress: Not on file  Social Connections: Not on file  Intimate Partner Violence: Not on file    FAMILY HISTORY: Family History  Problem Relation Age of Onset   Breast cancer Mother    Prostate cancer Neg Hx    Kidney cancer Neg Hx    Cancer Neg Hx     ALLERGIES:  has No Known Allergies.  MEDICATIONS:  Current Outpatient Medications  Medication Sig Dispense Refill   ALPRAZolam (XANAX) 0.25 MG tablet Take 1 tablet (0.25 mg total) by mouth at bedtime. 15 tablet 0   atorvastatin (LIPITOR) 20 MG tablet Take 1 tablet (20 mg total) by mouth daily. 90 tablet 3   clopidogrel (PLAVIX) 75 MG tablet Take 1 tablet (75 mg total) by mouth daily. 90 tablet 3   folic acid (FOLVITE) 1 MG tablet Take 1 tablet (1 mg total) by mouth daily. 30 tablet 4   ibuprofen (ADVIL) 400 MG tablet Take 1 tablet (400 mg total) by mouth every 6 (six) hours as needed. 60 tablet 2   ketoconazole (NIZORAL) 2 % cream Apply 1 application. topically 2 (two) times daily as needed for irritation.     metoprolol tartrate (LOPRESSOR) 25 MG tablet Take 1 tablet (25 mg total) by mouth daily. 90 tablet 3   omeprazole (PRILOSEC OTC) 20 MG tablet Take 20 mg by mouth daily.     pantoprazole (PROTONIX) 40 MG tablet Take 1 tablet (40 mg total) by mouth daily. 60 tablet 1   prochlorperazine (COMPAZINE) 10 MG tablet Take 1 tablet (10 mg total) by mouth every 6 (six) hours as needed for nausea or vomiting. 30 tablet 1   sertraline (ZOLOFT) 100 MG tablet Take 1 tablet (100 mg total) by mouth daily. (Patient taking differently: Take 50 mg by mouth daily.) 90 tablet 0   tamsulosin (FLOMAX) 0.4 MG CAPS capsule Take 1 capsule (0.4 mg total)  by mouth daily. 30 capsule 3   No current facility-administered medications for this visit.   Facility-Administered Medications Ordered in Other Visits  Medication Dose Route Frequency Provider Last Rate Last Admin   cemiplimab-rwlc (LIBTAYO) 350 mg in sodium chloride 0.9 % 100 mL chemo infusion  350 mg Intravenous Once Charlaine Dalton R, MD          .  PHYSICAL EXAMINATION: ECOG PERFORMANCE STATUS: 1 - Symptomatic but completely ambulatory  Vitals:   04/04/22 0905  BP: (!) 146/80  Pulse: 65  Temp: 97.9 F (36.6 C)  SpO2: 99%  Filed Weights   04/04/22 0905  Weight: 246 lb 4.8 oz (111.7 kg)    Physical Exam Vitals and nursing note reviewed.  HENT:     Head: Normocephalic and atraumatic.     Mouth/Throat:     Pharynx: Oropharynx is clear.  Eyes:     Extraocular Movements: Extraocular movements intact.     Pupils: Pupils are equal, round, and reactive to light.  Cardiovascular:     Rate and Rhythm: Normal rate and regular rhythm.  Pulmonary:     Comments: Decreased breath sounds bilaterally.  Abdominal:     Palpations: Abdomen is soft.  Musculoskeletal:        General: Normal range of motion.     Cervical back: Normal range of motion.  Skin:    General: Skin is warm.  Neurological:     General: No focal deficit present.     Mental Status: He is alert and oriented to person, place, and time.  Psychiatric:        Behavior: Behavior normal.        Judgment: Judgment normal.     LABORATORY DATA:  I have reviewed the data as listed Lab Results  Component Value Date   WBC 4.5 04/04/2022   HGB 15.1 04/04/2022   HCT 44.6 04/04/2022   MCV 95.7 04/04/2022   PLT 196 04/04/2022   Recent Labs    02/21/22 0805 03/14/22 0812 04/04/22 0835  NA 136 137 137  K 3.8 4.1 3.8  CL 105 104 107  CO2 21* 26 24  GLUCOSE 105* 112* 104*  BUN _0 CREATININE 0.88 1.14 1.03  CALCIUM 9.1 8.9 8.5*  GFRNONAA >60 >60 >60  PROT 7.5 7.7 7.2  ALBUMIN 4.4 4.3 4.0   AST _1 ALT _2 ALKPHOS 71 81 72  BILITOT 0.3 0.5 0.5    RADIOGRAPHIC STUDIES: I have personally reviewed the radiological images as listed and agreed with the findings in the report. No results found.  ASSESSMENT & PLAN:   Primary adenocarcinoma of upper lobe of right lung ([place]) # Non-small cell lung cancer/stage IV-favor adeno carcinoma-metastases to right femur/liver; synchronous squamous cell carcinoma scalp. S/p  carbo-Alimta-Libtayo [cycle #4]; APRIL 19th, 2023-  No significant change in a spiculated nodule of the posterior right pulmonary apex, in keeping with treated primary lung malignancy.  Unchanged, very subtle hypodense lesion in the liver.  No evidence of lymphadenopathy or new metastatic disease in the chest, abdomen, or pelvis. Currently on maintanence Libatayo.   # Proceed with  Single agent Libtayo maintenance- Labs today reviewed;  acceptable for treatment today.  # Diarrhea- 2-3 times day-2-3 days; no medications- currently resolved. Monitor closely on immunotherapy.   # SBRT of the right upper lobe lung lesion- s/p SBRT; Right proximal femoral lesion-symptomatic s/p radiation--STABLE.   # Squamous cell carcinoma of the scalp-s/p excision; positive margins- on libtayo-no clinical evidence of progression.  Might need to consider radiation-if any local progression noted/also placed on course lung cancer.  STABLE;  # Mild-moderate OSA: ok with CPAP- no contra-indications; help with fatigue-   # GERD- on prilosec;   # IV access: PIV  # DISPOSITION: # treatment  today;  # follow up in 3 weeks- MD;labs- cbc/cmp;25-OH vit D- Libtayo; - Dr.B          All questions were answered. The patient knows to call the clinic with any problems, questions or concerns.       Lenetta Quaker  Ann Lions, MD 04/04/2022 10:52 AM

## 2022-04-04 NOTE — Assessment & Plan Note (Addendum)
#   Non-small cell lung cancer/stage IV-favor adeno carcinoma-metastases to right femur/liver; synchronous squamous cell carcinoma scalp. S/p  carbo-Alimta-Libtayo [cycle #4]; APRIL 19th, 2023-  No significant change in a spiculated nodule of the posterior right pulmonary apex, in keeping with treated primary lung malignancy.  Unchanged, very subtle hypodense lesion in the liver.  No evidence of lymphadenopathy or new metastatic disease in the chest, abdomen, or pelvis. Currently on maintanence Libatayo.   # Proceed with  Single agent Libtayo maintenance- Labs today reviewed;  acceptable for treatment today.  # Diarrhea- 2-3 times day-2-3 days; no medications- currently resolved. Monitor closely on immunotherapy.   # SBRT of the right upper lobe lung lesion- s/p SBRT; Right proximal femoral lesion-symptomatic s/p radiation--STABLE.   # Squamous cell carcinoma of the scalp-s/p excision; positive margins- on libtayo-no clinical evidence of progression.  Might need to consider radiation-if any local progression noted/also placed on course lung cancer.  STABLE;  # Mild-moderate OSA: ok with CPAP- no contra-indications; help with fatigue-   # GERD- on prilosec;   # IV access: PIV  # DISPOSITION: # treatment  today;  # follow up in 3 weeks- MD;labs- cbc/cmp;25-OH vit D- Libtayo; - Dr.B

## 2022-04-04 NOTE — Progress Notes (Signed)
[facility] CSW Progress Note  Holiday representative met with caregiver to discuss current adjustment concerns, MOST form and advance directives. Ms. Leyh was accompanying the patient and brought her AD paperwork in hopes there was more than one witness available for notarization.  She was aware today os not a regularly schedule AD day.  CSW sat with Rudean Curt and dicussed the MOST letter along with [place] NP.  We discussed patient's current concerns with fatigue and both CSW and NP recommended CARE pro gram for the patient.  We discussed different aspects of the MOST form and NP was able to answer all medical questions Ms. Chillemi had.  CSW suggested palliative referral to [facility] and NO stated he would make the referral.  CSW encouraged Ms. Ardis to contact CSW if she had additional needs ot concerns.  Ms. Hehir verbalized understanding    Adelene Amas, LCSW

## 2022-04-04 NOTE — Progress Notes (Signed)
Chris [name] at [facility] [phone] [phone]   Name: Chris [name] Date: 04/04/2022 MRN: [id number]  DOB: 07-01-1961  Patient Care Team: Casilda Carls, MD as PCP - General (Internal Medicine) Lorretta Harp, MD as PCP - Cardiology (Cardiology) Curt Bears, MD as Consulting Physician (Oncology) Casilda Carls, MD as Referring Physician (Internal Medicine) Cammie Sickle, MD as Consulting Physician (Internal Medicine) Cammie Sickle, MD as Consulting Physician (Internal Medicine)    REASON FOR CONSULTATION: Chris [name] is a 61 y.o. male with multiple medical problems including stage IV non-small cell lung cancer with metastasis to femur/liver diagnosed in December 2022.  Patient is currently on treatment with carbo/Alimta/Libtayo but has had poor tolerance to treatment.  He is referred to radiation oncology for SBRT of right upper lobe lung lesion and right proximal femoral lesion.  Patient has had severe anxiety and was referred to palliative care to help address goals and manage ongoing symptoms.  SOCIAL HISTORY:     reports that he has been smoking cigarettes. He has a 63.00 pack-year smoking history. He has never used smokeless tobacco. He reports that he does not drink alcohol and does not use drugs.  Patient is married and lives at home with his wife.  They have a son and recently had grandchildren.  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  ADVANCE DIRECTIVES:  Not on file  CODE STATUS: Limited Code (CPR but no intubation, MOST form signed on 04/04/22)  PAST MEDICAL HISTORY: Past Medical History:  Diagnosis Date   Anxiety    associated with medical care,  worsened by difficulty hearing, does better with wife present   Complication of anesthesia    wife states very anxious may need pre sedation   Coronary artery disease    GERD (gastroesophageal reflux disease)    Hearing loss     mild per wife   Hearing loss    no hearing aids per wife   Hyperlipidemia    Hypertension    MI (myocardial infarction) ([place]) 04/17/2006   Acute inferolateral wall MI with cardiogenic shock and complete heart block   Primary adenocarcinoma of upper lobe of right lung ([place])    Prostate cancer ([place])    Tobacco abuse    Wears glasses    Wears partial dentures    Upper    PAST SURGICAL HISTORY:  Past Surgical History:  Procedure Laterality Date   BRONCHIAL BIOPSY  10/09/2021   Procedure: BRONCHIAL BIOPSIES;  Surgeon: Collene Gobble, MD;  Location: [facility];  Service: Pulmonary;;   BRONCHIAL BRUSHINGS  10/09/2021   Procedure: BRONCHIAL BRUSHINGS;  Surgeon: Collene Gobble, MD;  Location: [facility];  Service: Pulmonary;;   BRONCHIAL NEEDLE ASPIRATION BIOPSY  10/09/2021   Procedure: BRONCHIAL NEEDLE ASPIRATION BIOPSIES;  Surgeon: Collene Gobble, MD;  Location: [facility] ENDOSCOPY;  Service: Pulmonary;;   CARDIAC CATHETERIZATION  2007   left, RCA 100% occluded ruptured plaque with thrombus in the proximal segment   CYST EXCISION N/A 08/30/2021   Procedure: EXCISION OF POSTERIOR SCALP CYST;  Surgeon: Clovis Riley, MD;  Location: [facility];  Service: General;  Laterality: N/A;   CYSTOSCOPY N/A 07/17/2018   Procedure: Consuela Mimes;  Surgeon: Franchot Gallo, MD;  Location: [facility];  Service: Urology;  Laterality: N/A;  no seeds in bladder per Dr Diona Fanti   FIDUCIAL MARKER PLACEMENT  10/09/2021   Procedure: FIDUCIAL MARKER PLACEMENT;  Surgeon: Collene Gobble, MD;  Location:  [facility]COPY;  Service: Pulmonary;;   HAND SURGERY Right    has metal plate in arm   PROSTATE BIOPSY     RADIOACTIVE SEED IMPLANT N/A 07/17/2018   Procedure: RADIOACTIVE SEED IMPLANT/BRACHYTHERAPY IMPLANT;  Surgeon: Franchot Gallo, MD;  Location: [facility];  Service: Urology;  Laterality: N/A;  77 seeds   SPACE OAR INSTILLATION N/A 07/17/2018   Procedure: SPACE OAR  INSTILLATION;  Surgeon: Franchot Gallo, MD;  Location: [facility];  Service: Urology;  Laterality: N/A;   VIDEO BRONCHOSCOPY WITH RADIAL ENDOBRONCHIAL ULTRASOUND  10/09/2021   Procedure: VIDEO BRONCHOSCOPY WITH RADIAL ENDOBRONCHIAL ULTRASOUND;  Surgeon: Collene Gobble, MD;  Location: [place] ENDOSCOPY;  Service: Pulmonary;;   WRIST SURGERY  10/04/2012    HEMATOLOGY/ONCOLOGY HISTORY:  Oncology History Overview Note  DIAGNOSIS: 1) stage IV (T1c, N0, M1 C) non-small cell lung cancer favoring adenocarcinoma presented with right lung apical nodule in addition to metastatic disease in the right hepatic lobe and the proximal right femoral diaphysis diagnosed and December 2022. 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 LUNG, RUL, FINE NEEDLE ASPIRATION:  - Malignant cells consistent with non-small cell carcinoma, see comment   B. LUNG, RUL, BRUSHING:  - Malignant cells consistent with non-small cell carcinoma, see comment       COMMENT:   A and B.  Dr. Saralyn Pilar reviewed the case and concurs with the diagnosis.  Only rare malignant cells are present on the cellblock.  Immunohistochemical stains were attempted and show that the tumor cells  have patchy staining for TTF-1 whereas p63, p40 and CK5/6 are negative.  The findings are nondiagnostic but suggestive of a lung adenocarcinoma.  Dr. Lamonte Sakai was notified on 10/13/2021.   SEP-OCT 2022-  [Dermatology]-   Poorly differentiated squamous cell carcinoma; -  Carcinoma extends to the edges of the excision specimen   IMPRESSION: 1. The spiculated nodule at the right lung apex on recent neck CT is hypermetabolic and is concerning for primary bronchogenic carcinoma. Tissue sampling recommended. 2. Hypermetabolic activity within the occipital scalp and  small previously demonstrated right occipital lymph node compatible with known squamous cell carcinoma. Evaluation of the head and neck limited by motion artifact. 3. Hypermetabolic lesions inferiorly in the right hepatic lobe and in the proximal right femoral diaphysis suspicious for metastatic disease, primary uncertain in this patient with a history of prostate cancer. Correlate with PSA levels. Abdominal MRI without and with contrast may be helpful for further characterization of the liver lesion.  # 2007MI-CAD [s/p stent-Dr.Berry; EF 2021- 58%]     Primary adenocarcinoma of upper lobe of right lung ([place])  10/18/2021 Initial Diagnosis   Primary adenocarcinoma of upper lobe of right lung ([place])   10/18/2021 Cancer Staging   Staging form: Lung, AJCC 8th Edition - Clinical: Stage IVB (cT1c, cN0, cM1c) - Signed by Curt Bears, MD on 10/18/2021   11/01/2021 -  Chemotherapy   Patient is on Treatment Plan : LUNG NSCLC Pemetrexed + Carboplatin q21d x 4 Cycles       ALLERGIES:  has No Known Allergies.  MEDICATIONS:  Current Outpatient Medications  Medication Sig Dispense Refill   ALPRAZolam (XANAX) 0.25 MG tablet Take 1 tablet (0.25 mg total) by mouth at bedtime. 15 tablet 0   atorvastatin (LIPITOR) 20 MG tablet Take 1 tablet (20 mg total) by  mouth daily. 90 tablet 3   clopidogrel (PLAVIX) 75 MG tablet Take 1 tablet (75 mg total) by mouth daily. 90 tablet 3   folic acid (FOLVITE) 1 MG tablet Take 1 tablet (1 mg total) by mouth daily. 30 tablet 4   ibuprofen (ADVIL) 400 MG tablet Take 1 tablet (400 mg total) by mouth every 6 (six) hours as needed. 60 tablet 2   ketoconazole (NIZORAL) 2 % cream Apply 1 application. topically 2 (two) times daily as needed for irritation.     metoprolol tartrate (LOPRESSOR) 25 MG tablet Take 1 tablet (25 mg total) by mouth daily. 90 tablet 3   omeprazole (PRILOSEC OTC) 20 MG tablet Take 20 mg by mouth daily.     pantoprazole (PROTONIX) 40 MG tablet  Take 1 tablet (40 mg total) by mouth daily. 60 tablet 1   prochlorperazine (COMPAZINE) 10 MG tablet Take 1 tablet (10 mg total) by mouth every 6 (six) hours as needed for nausea or vomiting. 30 tablet 1   sertraline (ZOLOFT) 100 MG tablet Take 1 tablet (100 mg total) by mouth daily. (Patient taking differently: Take 50 mg by mouth daily.) 90 tablet 0   tamsulosin (FLOMAX) 0.4 MG CAPS capsule Take 1 capsule (0.4 mg total) by mouth daily. 30 capsule 3   No current facility-administered medications for this visit.   Facility-Administered Medications Ordered in Other Visits  Medication Dose Route Frequency Provider Last Rate Last Admin   cemiplimab-rwlc (LIBTAYO) 350 mg in sodium chloride 0.9 % 100 mL chemo infusion  350 mg Intravenous Once Cammie Sickle, MD 214 mL/hr at 04/04/22 1115 350 mg at 04/04/22 1115    VITAL SIGNS: There were no vitals taken for this visit. There were no vitals filed for this visit.  Estimated body mass index is 35.34 kg/m as calculated from the following:   Height as of an earlier encounter on 04/04/22: 5' 10"  (1.778 m).   Weight as of an earlier encounter on 04/04/22: 246 lb 4.8 oz (111.7 kg).  LABS: CBC:    Component Value Date/Time   WBC 4.5 04/04/2022 0835   HGB 15.1 04/04/2022 0835   HGB 16.4 10/31/2021 1107   HCT 44.6 04/04/2022 0835   PLT 196 04/04/2022 0835   PLT 217 10/31/2021 1107   MCV 95.7 04/04/2022 0835   NEUTROABS 2.7 04/04/2022 0835   LYMPHSABS 1.0 04/04/2022 0835   MONOABS 0.7 04/04/2022 0835   EOSABS 0.1 04/04/2022 0835   BASOSABS 0.0 04/04/2022 0835   Comprehensive Metabolic Panel:    Component Value Date/Time   NA 137 04/04/2022 0835   K 3.8 04/04/2022 0835   CL 107 04/04/2022 0835   CO2 24 04/04/2022 0835   BUN 18 04/04/2022 0835   CREATININE 1.03 04/04/2022 0835   CREATININE 1.08 10/31/2021 1107   GLUCOSE 104 (H) 04/04/2022 0835   CALCIUM 8.5 (L) 04/04/2022 0835   AST 21 04/04/2022 0835   AST 15 10/31/2021 1107   ALT  23 04/04/2022 0835   ALT 17 10/31/2021 1107   ALKPHOS 72 04/04/2022 0835   BILITOT 0.5 04/04/2022 0835   BILITOT 0.7 10/31/2021 1107   PROT 7.2 04/04/2022 0835   ALBUMIN 4.0 04/04/2022 0835    RADIOGRAPHIC STUDIES: No results found.  PERFORMANCE STATUS (ECOG) : 1 - Symptomatic but completely ambulatory  Review of Systems Unless otherwise noted, a complete review of systems is negative.  Physical Exam General: NAD Pulmonary: Unlabored Extremities: no edema, no joint deformities Skin: Scaly, erythematous rash beneath  pannus Neurological: Weakness but otherwise nonfocal  IMPRESSION: Follow-up visit.  Patient seen in infusion.  Patient reports that he is doing reasonably well.  He denies significant changes or concerns.  He seems to be tolerating treatments well.  He reports occasional diarrhea but it does not last long.  He has good appetite and denies other GI symptoms.  He reports anxiety is well controlled.  He does take an alprazolam occasionally at bedtime.  He has fatigue and daytime napping but underwent a sleep study recently with findings of apnea.  ENT has ordered a CPAP.  I met with patient's wife.  She has met with LCSW to complete ACP documents. I signed a MOST form today.  Patient would want an attempt at CPR but no intubation.  She cites when patient had his heart attack and required defibrillation.  Wife says ultimately their goal is comfort/quality of life and that expectedly as the disease changes over time they would likely opt for DNR.  I completed a MOST form today. The patient and family outlined their wishes for the following treatment decisions:  Cardiopulmonary Resuscitation: Attempt Resuscitation (CPR)  Medical Interventions: Limited Additional Interventions: Use medical treatment, IV fluids and cardiac monitoring as indicated, DO NOT USE intubation or mechanical ventilation. May consider use of less invasive airway support such as BiPAP or CPAP. Also  provide comfort measures. Transfer to the hospital if indicated. Avoid intensive care.   Antibiotics: Antibiotics if indicated  IV Fluids: IV fluids if indicated  Feeding Tube: No feeding tube    PLAN: -Continue current scope of treatment -Referral to community palliative care -Continue Zoloft/alprazolam -MOST form completed -Follow-up telephone visit 1 to 2 months   Patient expressed understanding and was in agreement with this plan. He also understands that He can call the clinic at any time with any questions, concerns, or complaints.     Time Total: 30 minutes  Visit consisted of counseling and education dealing with the complex and emotionally intense issues of symptom management and palliative care in the setting of serious and potentially life-threatening illness.Greater than 50%  of this time was spent counseling and coordinating care related to the above assessment and plan.  Signed by: Altha Harm, PhD, NP-C

## 2022-04-04 NOTE — Progress Notes (Signed)
Diarrhea at least tid, comes and goes. Could it be from therapy? Feels he may be getting dehydrated.  Sleep study under procedure tab.

## 2022-04-06 ENCOUNTER — Telehealth: Payer: Self-pay

## 2022-04-06 NOTE — Telephone Encounter (Signed)
Spoke with patient's spouse [name] and scheduled a Mychart Palliative Consult for 04/19/22 @ 2:30 PM per patient and spouse's request.   Consent obtained; updated Netsmart, Team List and Epic.

## 2022-04-09 ENCOUNTER — Encounter: Payer: Self-pay | Admitting: *Deleted

## 2022-04-19 ENCOUNTER — Telehealth: Payer: BC Managed Care – PPO | Admitting: Hospice

## 2022-04-19 DIAGNOSIS — F419 Anxiety disorder, unspecified: Secondary | ICD-10-CM | POA: Diagnosis not present

## 2022-04-19 DIAGNOSIS — Z515 Encounter for palliative care: Secondary | ICD-10-CM | POA: Diagnosis not present

## 2022-04-19 DIAGNOSIS — C3411 Malignant neoplasm of upper lobe, right bronchus or lung: Secondary | ICD-10-CM | POA: Diagnosis not present

## 2022-04-19 DIAGNOSIS — C61 Malignant neoplasm of prostate: Secondary | ICD-10-CM | POA: Diagnosis not present

## 2022-04-19 NOTE — Progress Notes (Signed)
[facility] Consult Note Telephone: [phone]  Fax: [phone]  PATIENT NAME: Chris [name] [address] [place] [address] [phone] (home)  DOB: May 26, 1961 MRN: [id number]  PRIMARY CARE PROVIDER:    Casilda Carls, MD,  [place] [address] [phone]  REFERRING PROVIDER:   Casilda Carls, [address],  [place] [address] [phone]  RESPONSIBLE PARTY:   Self/Elizabeth Contact Information     Name Relation Home Work Normal Spouse [phone]  [phone]       TELEHEALTH VISIT STATEMENT Due to the COVID-19 crisis, this visit was done via telemedicine from my office and it was initiated and consent by this patient and or family.  I connected with patient OR PROXY by a telephone/video  and verified that I am speaking with the correct person. I discussed the limitations of evaluation and management by telemedicine. The patient expressed understanding and agreed to proceed. Palliative Care was asked to follow this patient to address advance care planning, complex medical decision making and goals of care clarification. Chris [name] is with patient during visit. This is the initial visit.     ASSESSMENT AND / RECOMMENDATIONS:   Advance Care Planning: Our advance care planning conversation included a discussion about:    The value and importance of advance care planning  Difference between Hospice and Palliative care Exploration of goals of care in the event of a sudden injury or illness  Identification and preparation of a healthcare agent  Review and updating or creation of an  advance directive document . Decision not to resuscitate or to de-escalate disease focused treatments due to poor prognosis.  CODE STATUS: Patient is a Partial code Goals of Care: Goals include to maximize quality of life and symptom management  I spent 16  minutes providing  this initial consultation. More than 50% of the time in this consultation was spent on counseling patient and coordinating communication. --------------------------------------------------------------------------------------------------------------------------------------  Symptom Management/Plan: Right lung CA: Stage IV non-small cell lung cancer with metastasis to femur/liver diagnosed in December 2022.Tried chemo but poor tolerance to treatment. Continue Immunotherapy every three weeks as planned with Oncologist. Prostate CA: radiation seeds completed. Continue with Oncology surveillance as planned.  Anxiety: Continue with Zoloft 50 mg daily, and Xanax prn as ordered.  Routine CBC CMP. Follow up: Palliative care will continue to follow for complex medical decision making, advance care planning, and clarification of goals. Return 6 weeks or prn. Encouraged to call provider sooner with any concerns.   Family /Caregiver/Community Supports: Patient lives at home with spouse  HOSPICE ELIGIBILITY/DIAGNOSIS: TBD  Chief Complaint: Initial Palliative care visit  HISTORY OF PRESENT ILLNESS:  Chris [name] is a 61 y.o. year old male  with multiple morbidities requiring close monitoring and with high risk of complications and  mortality: Right lung cancer metastatic to liver and femoral, prostate cancer, squamous cell carcinoma of scalp, anxiety. History obtained from review of EMR, discussion with primary team, caregiver, family and/or Chris [name].  Review and summarization of Epic records shows history from other than patient. Rest of 10 point ROS asked and negative.  I reviewed as needed, available labs, patient records, imaging, studies and related documents from the EMR.  PAST MEDICAL HISTORY:  Active Ambulatory Problems    Diagnosis Date Noted   Coronary artery disease 10/27/2013   Hyperlipidemia 10/27/2013   Malignant neoplasm of prostate ([place]) 03/25/2018   Tobacco abuse 04/08/2018  Squamous cell carcinoma of scalp 09/13/2021   Right upper lobe pulmonary nodule 09/27/2021   Primary adenocarcinoma of upper lobe of right lung ([place]) 10/18/2021   Encounter for antineoplastic chemotherapy 10/18/2021   Encounter for antineoplastic immunotherapy 10/18/2021   Resolved Ambulatory Problems    Diagnosis Date Noted   No Resolved Ambulatory Problems   Past Medical History:  Diagnosis Date   Anxiety    Complication of anesthesia    GERD (gastroesophageal reflux disease)    Hearing loss    Hearing loss    Hypertension    MI (myocardial infarction) ([place]) 04/17/2006   Prostate cancer ([place])    Wears glasses    Wears partial dentures     SOCIAL HX:  Social History   Tobacco Use   Smoking status: Every Day    Packs/day: 1.50    Years: 42.00    Total pack years: 63.00    Types: Cigarettes   Smokeless tobacco: Never  Substance Use Topics   Alcohol use: No     FAMILY HX:  Family History  Problem Relation Age of Onset   Breast cancer Mother    Prostate cancer Neg Hx    Kidney cancer Neg Hx    Cancer Neg Hx       ALLERGIES: No Known Allergies    PERTINENT MEDICATIONS:  Outpatient Encounter Medications as of 04/19/2022  Medication Sig   ALPRAZolam (XANAX) 0.25 MG tablet Take 1 tablet (0.25 mg total) by mouth at bedtime.   atorvastatin (LIPITOR) 20 MG tablet Take 1 tablet (20 mg total) by mouth daily.   clopidogrel (PLAVIX) 75 MG tablet Take 1 tablet (75 mg total) by mouth daily.   folic acid (FOLVITE) 1 MG tablet Take 1 tablet (1 mg total) by mouth daily.   ibuprofen (ADVIL) 400 MG tablet Take 1 tablet (400 mg total) by mouth every 6 (six) hours as needed.   ketoconazole (NIZORAL) 2 % cream Apply 1 application. topically 2 (two) times daily as needed for irritation.   metoprolol tartrate (LOPRESSOR) 25 MG tablet Take 1 tablet (25 mg total) by mouth daily.   omeprazole (PRILOSEC OTC) 20 MG tablet Take 20 mg by mouth daily.   pantoprazole (PROTONIX) 40 MG tablet  Take 1 tablet (40 mg total) by mouth daily.   prochlorperazine (COMPAZINE) 10 MG tablet Take 1 tablet (10 mg total) by mouth every 6 (six) hours as needed for nausea or vomiting.   sertraline (ZOLOFT) 100 MG tablet Take 1 tablet (100 mg total) by mouth daily. (Patient taking differently: Take 50 mg by mouth daily.)   tamsulosin (FLOMAX) 0.4 MG CAPS capsule Take 1 capsule (0.4 mg total) by mouth daily.   No facility-administered encounter medications on file as of 04/19/2022.     Thank you for the opportunity to participate in the care of Mr. Poblano.  The palliative care team will continue to follow. Please call our office at [phone] if we can be of additional assistance.   Note: Portions of this note were generated with [employer]. Dictation errors may occur despite best attempts at proofreading.  Teodoro Spray, NP

## 2022-04-23 DIAGNOSIS — G4733 Obstructive sleep apnea (adult) (pediatric): Secondary | ICD-10-CM | POA: Diagnosis not present

## 2022-04-24 ENCOUNTER — Encounter: Payer: Self-pay | Admitting: Internal Medicine

## 2022-04-24 ENCOUNTER — Encounter: Payer: Self-pay | Admitting: *Deleted

## 2022-04-25 ENCOUNTER — Inpatient Hospital Stay: Payer: BC Managed Care – PPO

## 2022-04-25 ENCOUNTER — Inpatient Hospital Stay: Payer: BC Managed Care – PPO | Admitting: Internal Medicine

## 2022-05-09 ENCOUNTER — Inpatient Hospital Stay (HOSPITAL_BASED_OUTPATIENT_CLINIC_OR_DEPARTMENT_OTHER): Payer: BC Managed Care – PPO | Admitting: Internal Medicine

## 2022-05-09 ENCOUNTER — Telehealth: Payer: Self-pay | Admitting: *Deleted

## 2022-05-09 ENCOUNTER — Inpatient Hospital Stay: Payer: BC Managed Care – PPO

## 2022-05-09 ENCOUNTER — Inpatient Hospital Stay: Payer: BC Managed Care – PPO | Attending: Internal Medicine

## 2022-05-09 ENCOUNTER — Encounter: Payer: Self-pay | Admitting: Internal Medicine

## 2022-05-09 DIAGNOSIS — C7951 Secondary malignant neoplasm of bone: Secondary | ICD-10-CM | POA: Diagnosis not present

## 2022-05-09 DIAGNOSIS — C787 Secondary malignant neoplasm of liver and intrahepatic bile duct: Secondary | ICD-10-CM | POA: Diagnosis not present

## 2022-05-09 DIAGNOSIS — K219 Gastro-esophageal reflux disease without esophagitis: Secondary | ICD-10-CM | POA: Insufficient documentation

## 2022-05-09 DIAGNOSIS — Z79899 Other long term (current) drug therapy: Secondary | ICD-10-CM | POA: Diagnosis not present

## 2022-05-09 DIAGNOSIS — Z5111 Encounter for antineoplastic chemotherapy: Secondary | ICD-10-CM | POA: Insufficient documentation

## 2022-05-09 DIAGNOSIS — Z923 Personal history of irradiation: Secondary | ICD-10-CM | POA: Insufficient documentation

## 2022-05-09 DIAGNOSIS — C3411 Malignant neoplasm of upper lobe, right bronchus or lung: Secondary | ICD-10-CM | POA: Insufficient documentation

## 2022-05-09 LAB — CBC WITH DIFFERENTIAL/PLATELET
Abs Immature Granulocytes: 0.01 10*3/uL (ref 0.00–0.07)
Basophils Absolute: 0 10*3/uL (ref 0.0–0.1)
Basophils Relative: 0 %
Eosinophils Absolute: 0.1 10*3/uL (ref 0.0–0.5)
Eosinophils Relative: 1 %
HCT: 46.7 % (ref 39.0–52.0)
Hemoglobin: 15.6 g/dL (ref 13.0–17.0)
Immature Granulocytes: 0 %
Lymphocytes Relative: 21 %
Lymphs Abs: 1 10*3/uL (ref 0.7–4.0)
MCH: 31.3 pg (ref 26.0–34.0)
MCHC: 33.4 g/dL (ref 30.0–36.0)
MCV: 93.6 fL (ref 80.0–100.0)
Monocytes Absolute: 0.5 10*3/uL (ref 0.1–1.0)
Monocytes Relative: 10 %
Neutro Abs: 3.3 10*3/uL (ref 1.7–7.7)
Neutrophils Relative %: 68 %
Platelets: 202 10*3/uL (ref 150–400)
RBC: 4.99 MIL/uL (ref 4.22–5.81)
RDW: 12.8 % (ref 11.5–15.5)
WBC: 4.9 10*3/uL (ref 4.0–10.5)
nRBC: 0 % (ref 0.0–0.2)

## 2022-05-09 LAB — COMPREHENSIVE METABOLIC PANEL
ALT: 22 U/L (ref 0–44)
AST: 22 U/L (ref 15–41)
Albumin: 4 g/dL (ref 3.5–5.0)
Alkaline Phosphatase: 70 U/L (ref 38–126)
Anion gap: 8 (ref 5–15)
BUN: 16 mg/dL (ref 8–23)
CO2: 24 mmol/L (ref 22–32)
Calcium: 8.7 mg/dL — ABNORMAL LOW (ref 8.9–10.3)
Chloride: 105 mmol/L (ref 98–111)
Creatinine, Ser: 1.15 mg/dL (ref 0.61–1.24)
GFR, Estimated: >60 mL/min (ref >60)
Glucose, Bld: 147 mg/dL — ABNORMAL HIGH (ref 70–99)
Potassium: 3.9 mmol/L (ref 3.5–5.1)
Sodium: 137 mmol/L (ref 135–145)
Total Bilirubin: 0.6 mg/dL (ref 0.3–1.2)
Total Protein: 7.1 g/dL (ref 6.5–8.1)

## 2022-05-09 LAB — VITAMIN D 25 HYDROXY (VIT D DEFICIENCY, FRACTURES): Vit D, 25-Hydroxy: 25.63 ng/mL — ABNORMAL LOW (ref 30–100)

## 2022-05-09 MED ORDER — SODIUM CHLORIDE 0.9 % IV SOLN
Freq: Once | INTRAVENOUS | Status: AC
  Filled 2022-05-09: qty 250

## 2022-05-09 MED ORDER — SODIUM CHLORIDE 0.9% FLUSH
10.0000 mL | INTRAVENOUS | Status: DC | PRN
  Administered 2022-05-09: 10 mL
  Filled 2022-05-09: qty 10

## 2022-05-09 MED ORDER — SODIUM CHLORIDE 0.9 % IV SOLN
350.0000 mg | Freq: Once | INTRAVENOUS | Status: AC
  Administered 2022-05-09: 350 mg via INTRAVENOUS
  Filled 2022-05-09: qty 7

## 2022-05-09 NOTE — Progress Notes (Signed)
[place] CONSULT NOTE  Patient Care Team: Casilda Carls, MD as PCP - General (Internal Medicine) Lorretta Harp, MD as PCP - Cardiology (Cardiology) Curt Bears, MD as Consulting Physician (Oncology) Casilda Carls, MD as Referring Physician (Internal Medicine) Cammie Sickle, MD as Consulting Physician (Internal Medicine) Cammie Sickle, MD as Consulting Physician (Internal Medicine)  CHIEF COMPLAINTS/PURPOSE OF CONSULTATION: lung cancer  #  Oncology History Overview Note  DIAGNOSIS: 1) stage IV (T1c, N0, M1 C) non-small cell lung cancer favoring adenocarcinoma presented with right lung apical nodule in addition to metastatic disease in the right hepatic lobe and the proximal right femoral diaphysis diagnosed and December 2022. 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    QNS; Guardant 360 showed positive KRAS G12C mutation  FINAL MICROSCOPIC DIAGNOSIS:   A. LUNG, RUL, FINE NEEDLE ASPIRATION:  - Malignant cells consistent with non-small cell carcinoma, see comment   B. LUNG, RUL, BRUSHING:  - Malignant cells consistent with non-small cell carcinoma, see comment       COMMENT:   A and B.  Dr. Saralyn Pilar reviewed the case and concurs with the diagnosis.  Only rare malignant cells are present on the cellblock.  Immunohistochemical stains were attempted and show that the tumor cells  have patchy staining for TTF-1 whereas p63, p40 and CK5/6 are negative.  The findings are nondiagnostic but suggestive of a lung adenocarcinoma.  Dr. Lamonte Sakai was notified on 10/13/2021.   SEP-OCT 2022-  [Dermatology]-   Poorly differentiated squamous cell carcinoma; -  Carcinoma extends to the edges of the excision specimen   IMPRESSION: 1. The spiculated nodule at the right lung apex on recent neck CT is hypermetabolic and is concerning for primary bronchogenic  carcinoma. Tissue sampling recommended. 2. Hypermetabolic activity within the occipital scalp and small previously demonstrated right occipital lymph node compatible with known squamous cell carcinoma. Evaluation of the head and neck limited by motion artifact. 3. Hypermetabolic lesions inferiorly in the right hepatic lobe and in the proximal right femoral diaphysis suspicious for metastatic disease, primary uncertain in this patient with a history of prostate cancer. Correlate with PSA levels. Abdominal MRI without and with contrast may be helpful for further characterization of the liver lesion.  # s/p RT tto RUL [[place]]; right hip RT;   # 2007MI-CAD [s/p stent-Dr.Berry; EF 2021- 58%]     Primary adenocarcinoma of upper lobe of right lung ([place])  10/18/2021 Initial Diagnosis   Primary adenocarcinoma of upper lobe of right lung ([place])   10/18/2021 Cancer Staging   Staging form: Lung, AJCC 8th Edition - Clinical: Stage IVB (cT1c, cN0, cM1c) - Signed by Curt Bears, MD on 10/18/2021   11/01/2021 -  Chemotherapy   Patient is on Treatment Plan : LUNG NSCLC Pemetrexed + Carboplatin q21d x 4 Cycles      HISTORY OF PRESENTING ILLNESS: Ambulating independently.  Accompanied by his wife.   Chris [name] 61 y.o.  male metastatic stage IV non-small cell lung cancer/favor adenocarcinoma-currently on maintained libatyo here for follow-up.  Patient is improved.  Denies any significant nausea vomiting.  Continues have ongoing fatigue.  Denies any fever chills.  No nausea no vomiting.   Complaints of worsening pain in the right hip.  5 out of scale of 10.  Taking NSAIDs as needed.  Review of Systems  Constitutional:  Positive for malaise/fatigue. Negative for chills, diaphoresis, fever and weight loss.  HENT:  Positive for hearing loss. Negative for nosebleeds and sore throat.   Eyes:  Negative for double vision.  Respiratory:  Negative for cough, hemoptysis, sputum production,  shortness of breath and wheezing.   Cardiovascular:  Negative for chest pain, palpitations, orthopnea and leg swelling.  Gastrointestinal:  Negative for abdominal pain, blood in stool, diarrhea, heartburn, melena, nausea and vomiting.  Genitourinary:  Negative for dysuria, frequency and urgency.  Musculoskeletal:  Negative for back pain.  Skin: Negative.  Negative for itching and rash.  Neurological:  Negative for dizziness, tingling, focal weakness, weakness and headaches.  Endo/Heme/Allergies:  Does not bruise/bleed easily.  Psychiatric/Behavioral:  Negative for depression. The patient does not have insomnia.      MEDICAL HISTORY:  Past Medical History:  Diagnosis Date   Anxiety    associated with medical care,  worsened by difficulty hearing, does better with wife present   Complication of anesthesia    wife states very anxious may need pre sedation   Coronary artery disease    GERD (gastroesophageal reflux disease)    Hearing loss    mild per wife   Hearing loss    no hearing aids per wife   Hyperlipidemia    Hypertension    MI (myocardial infarction) ([place]) 04/17/2006   Acute inferolateral wall MI with cardiogenic shock and complete heart block   Primary adenocarcinoma of upper lobe of right lung ([place])    Prostate cancer ([place])    Tobacco abuse    Wears glasses    Wears partial dentures    Upper    SURGICAL HISTORY: Past Surgical History:  Procedure Laterality Date   BRONCHIAL BIOPSY  10/09/2021   Procedure: BRONCHIAL BIOPSIES;  Surgeon: Collene Gobble, MD;  Location: [facility];  Service: Pulmonary;;   BRONCHIAL BRUSHINGS  10/09/2021   Procedure: BRONCHIAL BRUSHINGS;  Surgeon: Collene Gobble, MD;  Location: [facility];  Service: Pulmonary;;   BRONCHIAL NEEDLE ASPIRATION BIOPSY  10/09/2021   Procedure: BRONCHIAL NEEDLE ASPIRATION BIOPSIES;  Surgeon: Collene Gobble, MD;  Location: [facility];  Service: Pulmonary;;   CARDIAC CATHETERIZATION  2007   left, RCA  100% occluded ruptured plaque with thrombus in the proximal segment   CYST EXCISION N/A 08/30/2021   Procedure: EXCISION OF POSTERIOR SCALP CYST;  Surgeon: Clovis Riley, MD;  Location: [facility];  Service: General;  Laterality: N/A;   CYSTOSCOPY N/A 07/17/2018   Procedure: Consuela Mimes;  Surgeon: Franchot Gallo, MD;  Location[facility];  Service: Urology;  Laterality: N/A;  no seeds in bladder per Dr Diona Fanti   FIDUCIAL MARKER PLACEMENT  10/09/2021   Procedure: FIDUCIAL MARKER PLACEMENT;  Surgeon: Collene Gobble, MD;  Location: [facility];  Service: Pulmonary;;   HAND SURGERY Right    has metal plate in arm   PROSTATE BIOPSY     RADIOACTIVE SEED IMPLANT N/A 07/17/2018   Procedure: RADIOACTIVE SEED IMPLANT/BRACHYTHERAPY IMPLANT;  Surgeon: Franchot Gallo, MD;  Location: [facility];  Service: Urology;  Laterality: N/A;  77 seeds   SPACE OAR INSTILLATION N/A 07/17/2018   Procedure: SPACE OAR INSTILLATION;  Surgeon: Franchot Gallo, MD;  Location: [facility];  Service: Urology;  Laterality: N/A;   VIDEO BRONCHOSCOPY WITH RADIAL ENDOBRONCHIAL ULTRASOUND  10/09/2021   Procedure: VIDEO BRONCHOSCOPY WITH RADIAL ENDOBRONCHIAL ULTRASOUND;  Surgeon: Collene Gobble, MD;  Location: [facility];  Service: Pulmonary;;   WRIST SURGERY  10/04/2012    SOCIAL HISTORY: Social History   Socioeconomic History  Marital status: Married    Spouse name: Not on file   Number of children: Not on file   Years of education: Not on file   Highest education level: Not on file  Occupational History   Not on file  Tobacco Use   Smoking status: Every Day    Packs/day: 1.50    Years: 42.00    Total pack years: 63.00    Types: Cigarettes   Smokeless tobacco: Never  Vaping Use   Vaping Use: Never used  Substance and Sexual Activity   Alcohol use: No   Drug use: No   Sexual activity: Yes    Birth control/protection: None  Other Topics Concern    Not on file  Social History Narrative   10-04 19 Unable to ask abuse questions wife with him today.   Social Determinants of Health   Financial Resource Strain: Not on file  Food Insecurity: Not on file  Transportation Needs: Not on file  Physical Activity: Not on file  Stress: Not on file  Social Connections: Not on file  Intimate Partner Violence: Not on file    FAMILY HISTORY: Family History  Problem Relation Age of Onset   Breast cancer Mother    Prostate cancer Neg Hx    Kidney cancer Neg Hx    Cancer Neg Hx     ALLERGIES:  has No Known Allergies.  MEDICATIONS:  Current Outpatient Medications  Medication Sig Dispense Refill   ALPRAZolam (XANAX) 0.25 MG tablet Take 1 tablet (0.25 mg total) by mouth at bedtime. 15 tablet 0   atorvastatin (LIPITOR) 20 MG tablet Take 1 tablet (20 mg total) by mouth daily. 90 tablet 3   clopidogrel (PLAVIX) 75 MG tablet Take 1 tablet (75 mg total) by mouth daily. 90 tablet 3   folic acid (FOLVITE) 1 MG tablet Take 1 tablet (1 mg total) by mouth daily. 30 tablet 4   ibuprofen (ADVIL) 400 MG tablet Take 1 tablet (400 mg total) by mouth every 6 (six) hours as needed. 60 tablet 2   ketoconazole (NIZORAL) 2 % cream Apply 1 application. topically 2 (two) times daily as needed for irritation.     metoprolol tartrate (LOPRESSOR) 25 MG tablet Take 1 tablet (25 mg total) by mouth daily. 90 tablet 3   omeprazole (PRILOSEC OTC) 20 MG tablet Take 20 mg by mouth daily.     pantoprazole (PROTONIX) 40 MG tablet Take 1 tablet (40 mg total) by mouth daily. 60 tablet 1   sertraline (ZOLOFT) 100 MG tablet Take 1 tablet (100 mg total) by mouth daily. (Patient taking differently: Take 50 mg by mouth daily.) 90 tablet 0   tamsulosin (FLOMAX) 0.4 MG CAPS capsule Take 1 capsule (0.4 mg total) by mouth daily. 30 capsule 3   prochlorperazine (COMPAZINE) 10 MG tablet Take 1 tablet (10 mg total) by mouth every 6 (six) hours as needed for nausea or vomiting. (Patient not  taking: Reported on 05/09/2022) 30 tablet 1   No current facility-administered medications for this visit.   Facility-Administered Medications Ordered in Other Visits  Medication Dose Route Frequency Provider Last Rate Last Admin   cemiplimab-rwlc (LIBTAYO) 350 mg in sodium chloride 0.9 % 100 mL chemo infusion  350 mg Intravenous Once Cammie Sickle, MD 214 mL/hr at 05/09/22 1207 350 mg at 05/09/22 1207   sodium chloride flush (NS) 0.9 % injection 10 mL  10 mL Intracatheter PRN Cammie Sickle, MD   10 mL at 05/09/22 1203      .  PHYSICAL EXAMINATION: ECOG PERFORMANCE STATUS: 1 - Symptomatic but completely ambulatory  Vitals:   05/09/22 1107  BP: 138/82  Pulse: 65  Resp: 16  Temp: (!) 96.7 F (35.9 C)   Filed Weights   05/09/22 1107  Weight: 242 lb (109.8 kg)    Physical Exam Vitals and nursing note reviewed.  HENT:     Head: Normocephalic and atraumatic.     Mouth/Throat:     Pharynx: Oropharynx is clear.  Eyes:     Extraocular Movements: Extraocular movements intact.     Pupils: Pupils are equal, round, and reactive to light.  Cardiovascular:     Rate and Rhythm: Normal rate and regular rhythm.  Pulmonary:     Comments: Decreased breath sounds bilaterally.  Abdominal:     Palpations: Abdomen is soft.  Musculoskeletal:        General: Normal range of motion.     Cervical back: Normal range of motion.  Skin:    General: Skin is warm.  Neurological:     General: No focal deficit present.     Mental Status: He is alert and oriented to person, place, and time.  Psychiatric:        Behavior: Behavior normal.        Judgment: Judgment normal.      LABORATORY DATA:  I have reviewed the data as listed Lab Results  Component Value Date   WBC 4.9 05/09/2022   HGB 15.6 05/09/2022   HCT 46.7 05/09/2022   MCV 93.6 05/09/2022   PLT 202 05/09/2022   Recent Labs    03/14/22 0812 04/04/22 0835 05/09/22 1047  NA 137 137 137  K 4.1 3.8 3.9  CL 104  107 105  CO2 _0 GLUCOSE 112* 104* 147*  BUN _1 CREATININE 1.14 1.03 1.15  CALCIUM 8.9 8.5* 8.7*  GFRNONAA >60 >60 >60  PROT 7.7 7.2 7.1  ALBUMIN 4.3 4.0 4.0  AST _2 ALT _3 ALKPHOS 81 72 70  BILITOT 0.5 0.5 0.6    RADIOGRAPHIC STUDIES: I have personally reviewed the radiological images as listed and agreed with the findings in the report. No results found.  ASSESSMENT & PLAN:   Primary adenocarcinoma of upper lobe of right lung ([place]) # Non-small cell lung cancer/stage IV-favor adeno carcinoma-metastases to right femur/liver; synchronous squamous cell carcinoma scalp. S/p  carbo-Alimta-Libtayo [cycle #4]; APRIL 19th, 2023-  No significant change in a spiculated nodule of the posterior right pulmonary apex, in keeping with treated primary lung malignancy.  Unchanged, very subtle hypodense lesion in the liver.  No evidence of lymphadenopathy or new metastatic disease in the chest, abdomen, or pelvis. Currently on maintanence Libatayo.   # Proceed with  Single agent Libtayo maintenance- Labs today reviewed;  acceptable for treatment today.  We will get a CT CAP scan prior to next visit.   # right hip pain-status post radiation [[place]]-worsening as per patient.  Recommend a bone scan for further evaluation.  Okay to proceed with NSAIDs as needed.  # Diarrhea- 2-3 times day-2-3 days; no medications- currently resolved. Monitor closely on immunotherapy.   # Squamous cell carcinoma of the scalp-s/p excision; positive margins- on libtayo-no clinical evidence of progression.  Might need to consider radiation-if any local progression noted/also placed on course lung cancer.  STABLE.   # GERD- on prilosec;   # IV access: PIV  # DISPOSITION: # treatment  Today.  # follow up  in 3 weeks- MD;labs- cbc/cmp; TSH-  Libtayo; prior-CT CAP; bone scan- - Dr.B          All questions were answered. The patient knows to call the clinic with any problems, questions or  concerns.       Cammie Sickle, MD 05/09/2022 12:17 PM

## 2022-05-09 NOTE — Telephone Encounter (Signed)
Patients wife sent mychart message asking for call from nursing to answer questions. Nurse attempted to call, no answer. Left vm for patient to return call.

## 2022-05-09 NOTE — Progress Notes (Signed)
Pt in for follow up, reports has been having more deep pain in right upper leg.

## 2022-05-09 NOTE — Assessment & Plan Note (Addendum)
#   Non-small cell lung cancer/stage IV-favor adeno carcinoma-metastases to right femur/liver; synchronous squamous cell carcinoma scalp. S/p  carbo-Alimta-Libtayo [cycle #4]; APRIL 19th, 2023-  No significant change in a spiculated nodule of the posterior right pulmonary apex, in keeping with treated primary lung malignancy.  Unchanged, very subtle hypodense lesion in the liver.  No evidence of lymphadenopathy or new metastatic disease in the chest, abdomen, or pelvis. Currently on maintanence Libatayo.   # Proceed with  Single agent Libtayo maintenance- Labs today reviewed;  acceptable for treatment today.  We will get a CT CAP scan prior to next visit.   # right hip pain-status post radiation [[place]]-worsening as per patient.  Recommend a bone scan for further evaluation.  Okay to proceed with NSAIDs as needed.  # Diarrhea- 2-3 times day-2-3 days; no medications- currently resolved. Monitor closely on immunotherapy.   # Squamous cell carcinoma of the scalp-s/p excision; positive margins- on libtayo-no clinical evidence of progression.  Might need to consider radiation-if any local progression noted/also placed on course lung cancer.  STABLE.   # GERD- on prilosec;   # IV access: PIV  # DISPOSITION: # treatment  Today.  # follow up in 3 weeks- MD;labs- cbc/cmp; TSH-  Libtayo; prior-CT CAP; bone scan- - Dr.B

## 2022-05-09 NOTE — Patient Instructions (Signed)
[facility] [place]  Discharge Instructions: Thank you for choosing [facility] to provide your oncology and hematology care.  If you have a lab appointment with the [facility], please go directly to the [facility] and check in at the registration area.  Wear comfortable clothing and clothing appropriate for easy access to any Portacath or PICC line.   We strive to give you quality time with your provider. You may need to reschedule your appointment if you arrive late (15 or more minutes).  Arriving late affects you and other patients whose appointments are after yours.  Also, if you miss three or more appointments without notifying the office, you may be dismissed from the clinic at the provider's discretion.      For prescription refill requests, have your pharmacy contact our office and allow 72 hours for refills to be completed.    Today you received the following chemotherapy and/or immunotherapy agents: cemiplimab   To help prevent nausea and vomiting after your treatment, we encourage you to take your nausea medication as directed.  BELOW ARE SYMPTOMS THAT SHOULD BE REPORTED IMMEDIATELY: *FEVER GREATER THAN 100.4 F (38 C) OR HIGHER *CHILLS OR SWEATING *NAUSEA AND VOMITING THAT IS NOT CONTROLLED WITH YOUR NAUSEA MEDICATION *UNUSUAL SHORTNESS OF BREATH *UNUSUAL BRUISING OR BLEEDING *URINARY PROBLEMS (pain or burning when urinating, or frequent urination) *BOWEL PROBLEMS (unusual diarrhea, constipation, pain near the anus) TENDERNESS IN MOUTH AND THROAT WITH OR WITHOUT PRESENCE OF ULCERS (sore throat, sores in mouth, or a toothache) UNUSUAL RASH, SWELLING OR PAIN  UNUSUAL VAGINAL DISCHARGE OR ITCHING   Items with * indicate a potential emergency and should be followed up as soon as possible or go to the Emergency Department if any problems should occur.  Please show the CHEMOTHERAPY ALERT CARD or IMMUNOTHERAPY ALERT CARD at check-in to  the Emergency Department and triage nurse.  Should you have questions after your visit or need to cancel or reschedule your appointment, please contact [facility] [place] [facility] [place]  [phone] and follow the prompts.  Office hours are 8:00 a.m. to 4:30 p.m. Monday - Friday. Please note that voicemails left after 4:00 p.m. may not be returned until the following business day.  We are closed weekends and major holidays. You have access to a nurse at all times for urgent questions. Please call the main number to the clinic [phone] and follow the prompts.  For any non-urgent questions, you may also contact your provider using MyChart. We now offer e-Visits for anyone 93 and older to request care online for non-urgent symptoms. For details visit [email or web address].   Also download the MyChart app! Go to the app store, search "MyChart", open the app, select [facility], and log in with your MyChart username and password.  Masks are optional in the cancer centers. If you would like for your care team to wear a mask while they are taking care of you, please let them know. For doctor visits, patients may have with them one support person who is at least 61 years old. At this time, visitors are not allowed in the infusion area.

## 2022-05-17 ENCOUNTER — Other Ambulatory Visit: Payer: BC Managed Care – PPO | Admitting: Hospice

## 2022-05-23 ENCOUNTER — Encounter
Admission: RE | Admit: 2022-05-23 | Discharge: 2022-05-23 | Disposition: A | Discharge status: Home / Self Care | Payer: BC Managed Care – PPO | Source: Ambulatory Visit | Attending: Internal Medicine | Admitting: Internal Medicine

## 2022-05-23 ENCOUNTER — Ambulatory Visit
Admission: RE | Admit: 2022-05-23 | Discharge: 2022-05-23 | Disposition: A | Discharge status: Home / Self Care | Payer: BC Managed Care – PPO | Source: Ambulatory Visit | Attending: Internal Medicine | Admitting: Internal Medicine

## 2022-05-23 ENCOUNTER — Other Ambulatory Visit: Payer: Self-pay | Admitting: *Deleted

## 2022-05-23 DIAGNOSIS — K769 Liver disease, unspecified: Secondary | ICD-10-CM | POA: Diagnosis not present

## 2022-05-23 DIAGNOSIS — I700 Atherosclerosis of aorta: Secondary | ICD-10-CM | POA: Diagnosis not present

## 2022-05-23 DIAGNOSIS — C3490 Malignant neoplasm of unspecified part of unspecified bronchus or lung: Secondary | ICD-10-CM | POA: Diagnosis not present

## 2022-05-23 DIAGNOSIS — Z85118 Personal history of other malignant neoplasm of bronchus and lung: Secondary | ICD-10-CM | POA: Diagnosis not present

## 2022-05-23 DIAGNOSIS — G4733 Obstructive sleep apnea (adult) (pediatric): Secondary | ICD-10-CM | POA: Diagnosis not present

## 2022-05-23 DIAGNOSIS — C3411 Malignant neoplasm of upper lobe, right bronchus or lung: Secondary | ICD-10-CM | POA: Insufficient documentation

## 2022-05-23 DIAGNOSIS — M160 Bilateral primary osteoarthritis of hip: Secondary | ICD-10-CM | POA: Diagnosis not present

## 2022-05-23 DIAGNOSIS — J439 Emphysema, unspecified: Secondary | ICD-10-CM | POA: Diagnosis not present

## 2022-05-23 DIAGNOSIS — R911 Solitary pulmonary nodule: Secondary | ICD-10-CM | POA: Diagnosis not present

## 2022-05-23 DIAGNOSIS — Z8546 Personal history of malignant neoplasm of prostate: Secondary | ICD-10-CM | POA: Diagnosis not present

## 2022-05-23 DIAGNOSIS — K5730 Diverticulosis of large intestine without perforation or abscess without bleeding: Secondary | ICD-10-CM | POA: Diagnosis not present

## 2022-05-23 MED ORDER — IBUPROFEN 400 MG PO TABS: 400.0000 mg | ORAL_TABLET | Freq: Four times a day (QID) | ORAL | 2 refills | Status: DC | PRN

## 2022-05-23 MED ORDER — IOHEXOL 300 MG/ML  SOLN
100.0000 mL | Freq: Once | INTRAMUSCULAR | Status: AC | PRN
  Administered 2022-05-23: 100 mL via INTRAVENOUS

## 2022-05-23 MED ORDER — TECHNETIUM TC 99M MEDRONATE IV KIT
20.0000 | PACK | Freq: Once | INTRAVENOUS | Status: AC | PRN
  Administered 2022-05-23: 21.6 via INTRAVENOUS

## 2022-05-24 ENCOUNTER — Telehealth: Payer: Self-pay

## 2022-05-24 ENCOUNTER — Encounter: Payer: Self-pay | Admitting: Internal Medicine

## 2022-05-24 NOTE — Telephone Encounter (Signed)
I spoke with patient's wife by phone.  All questions answered to her verbalized satisfaction.

## 2022-05-24 NOTE — Telephone Encounter (Addendum)
Pt/wife would like a call to discuss pt's scans done yesterday. Has appt next week but would like explanation beforehand. Could you please call to discuss concerns?  These are from his mychart messages:  Bone scan ?'s: 1)Multifocal radiotracer uptake involving the shoulds, spine, abkles, knees, hips in a pattern most consistent with degenerative arthropathy.    me: what is this from? The cancer or wear & tear?    me: looks like femur only thing glowed, correct?   CT Scan ?'s: me: confirm everything better, treatment working? me: What is slightly increased interlobular septal thickening and architectural distortion right lung to reflexx post treatment change ?

## 2022-05-30 ENCOUNTER — Inpatient Hospital Stay: Payer: BC Managed Care – PPO

## 2022-05-30 ENCOUNTER — Inpatient Hospital Stay (HOSPITAL_BASED_OUTPATIENT_CLINIC_OR_DEPARTMENT_OTHER): Payer: BC Managed Care – PPO | Admitting: Internal Medicine

## 2022-05-30 ENCOUNTER — Inpatient Hospital Stay: Payer: BC Managed Care – PPO | Attending: Internal Medicine

## 2022-05-30 ENCOUNTER — Encounter: Payer: Self-pay | Admitting: Internal Medicine

## 2022-05-30 DIAGNOSIS — Z5111 Encounter for antineoplastic chemotherapy: Secondary | ICD-10-CM | POA: Diagnosis not present

## 2022-05-30 DIAGNOSIS — Z923 Personal history of irradiation: Secondary | ICD-10-CM | POA: Diagnosis not present

## 2022-05-30 DIAGNOSIS — C4442 Squamous cell carcinoma of skin of scalp and neck: Secondary | ICD-10-CM | POA: Diagnosis not present

## 2022-05-30 DIAGNOSIS — R490 Dysphonia: Secondary | ICD-10-CM | POA: Insufficient documentation

## 2022-05-30 DIAGNOSIS — C7951 Secondary malignant neoplasm of bone: Secondary | ICD-10-CM | POA: Diagnosis not present

## 2022-05-30 DIAGNOSIS — C787 Secondary malignant neoplasm of liver and intrahepatic bile duct: Secondary | ICD-10-CM | POA: Diagnosis not present

## 2022-05-30 DIAGNOSIS — F17210 Nicotine dependence, cigarettes, uncomplicated: Secondary | ICD-10-CM | POA: Diagnosis not present

## 2022-05-30 DIAGNOSIS — K219 Gastro-esophageal reflux disease without esophagitis: Secondary | ICD-10-CM | POA: Diagnosis not present

## 2022-05-30 DIAGNOSIS — C3411 Malignant neoplasm of upper lobe, right bronchus or lung: Secondary | ICD-10-CM | POA: Diagnosis not present

## 2022-05-30 DIAGNOSIS — Z79899 Other long term (current) drug therapy: Secondary | ICD-10-CM | POA: Diagnosis not present

## 2022-05-30 DIAGNOSIS — Z8546 Personal history of malignant neoplasm of prostate: Secondary | ICD-10-CM | POA: Insufficient documentation

## 2022-05-30 LAB — CBC WITH DIFFERENTIAL/PLATELET
Abs Immature Granulocytes: 0.01 10*3/uL (ref 0.00–0.07)
Basophils Absolute: 0 10*3/uL (ref 0.0–0.1)
Basophils Relative: 1 %
Eosinophils Absolute: 0.1 10*3/uL (ref 0.0–0.5)
Eosinophils Relative: 1 %
HCT: 45.3 % (ref 39.0–52.0)
Hemoglobin: 15.4 g/dL (ref 13.0–17.0)
Immature Granulocytes: 0 %
Lymphocytes Relative: 23 %
Lymphs Abs: 1.3 10*3/uL (ref 0.7–4.0)
MCH: 31.2 pg (ref 26.0–34.0)
MCHC: 34 g/dL (ref 30.0–36.0)
MCV: 91.7 fL (ref 80.0–100.0)
Monocytes Absolute: 0.8 10*3/uL (ref 0.1–1.0)
Monocytes Relative: 14 %
Neutro Abs: 3.4 10*3/uL (ref 1.7–7.7)
Neutrophils Relative %: 61 %
Platelets: 200 10*3/uL (ref 150–400)
RBC: 4.94 MIL/uL (ref 4.22–5.81)
RDW: 13.2 % (ref 11.5–15.5)
WBC: 5.5 10*3/uL (ref 4.0–10.5)
nRBC: 0 % (ref 0.0–0.2)

## 2022-05-30 LAB — COMPREHENSIVE METABOLIC PANEL
ALT: 23 U/L (ref 0–44)
AST: 19 U/L (ref 15–41)
Albumin: 4.3 g/dL (ref 3.5–5.0)
Alkaline Phosphatase: 67 U/L (ref 38–126)
Anion gap: 6 (ref 5–15)
BUN: 17 mg/dL (ref 8–23)
CO2: 26 mmol/L (ref 22–32)
Calcium: 9 mg/dL (ref 8.9–10.3)
Chloride: 106 mmol/L (ref 98–111)
Creatinine, Ser: 1.04 mg/dL (ref 0.61–1.24)
GFR, Estimated: >60 mL/min (ref >60)
Glucose, Bld: 104 mg/dL — ABNORMAL HIGH (ref 70–99)
Potassium: 4 mmol/L (ref 3.5–5.1)
Sodium: 138 mmol/L (ref 135–145)
Total Bilirubin: 0.5 mg/dL (ref 0.3–1.2)
Total Protein: 7.4 g/dL (ref 6.5–8.1)

## 2022-05-30 MED ORDER — SODIUM CHLORIDE 0.9 % IV SOLN
350.0000 mg | Freq: Once | INTRAVENOUS | Status: AC
  Administered 2022-05-30: 350 mg via INTRAVENOUS
  Filled 2022-05-30: qty 7

## 2022-05-30 MED ORDER — SODIUM CHLORIDE 0.9 % IV SOLN
Freq: Once | INTRAVENOUS | Status: AC
  Filled 2022-05-30: qty 250

## 2022-05-30 MED ORDER — ERGOCALCIFEROL 1.25 MG (50000 UT) PO CAPS: 50000.0000 [IU] | ORAL_CAPSULE | ORAL | 1 refills | Status: DC

## 2022-05-30 NOTE — Assessment & Plan Note (Addendum)
#   Non-small cell lung cancer/stage IV-favor adeno carcinoma-metastases to right femur/liver; synchronous squamous cell carcinoma scalp. S/p  carbo-Alimta-Libtayo [cycle #4]; urrently on maintanence Libatayo. JULY 30th, 2023-  Decreased size of the spiculated pulmonary nodule in the right lung; right lung Radiation changes; slight decrease in the liver lesion.  No new disease noted.  # Proceed with  Single agent Libtayo maintenance- Labs today reviewed;  acceptable for treatment today.    # right hip pain-status post radiation [[place]]-Bone scan- subtle uptake Okay to proceed with NSAIDs as needed. Discussed re: referral to [facility]; wants to wait.   # Diarrhea- 2-3 times day-2-3 days; no medications- currently resolved. Monitor closely on immunotherapy.   # Low vit D- [July 2023- vit D- 25]; sent script for ergocalciferol  # Squamous cell carcinoma of the scalp-s/p excision; positive margins- on libtayo-no clinical evidence of progression.  Might need to consider radiation-if any local progression noted/also based on course lung cancer.  STABLE.   # GERD- on prilosec recommend PPI BID prior to meals.    # IV access: PIV  # DISPOSITION: #Libtayo;- treatment  Today.  # follow up in 3 weeks- MD;labs- cbc/cmp; Libtayo;- Dr.B  # I reviewed the blood work- with the patient in detail; also reviewed the imaging independently [as summarized above]; and with the patient in detail.

## 2022-05-30 NOTE — Progress Notes (Signed)
C/o sore throat off and on x1 month.

## 2022-05-30 NOTE — Patient Instructions (Signed)
[facility] CANCER CTR AT [place]  Discharge Instructions: Thank you for choosing [facility] to provide your oncology and hematology care.  If you have a lab appointment with the [facility], please go directly to the [facility] and check in at the registration area.  Wear comfortable clothing and clothing appropriate for easy access to any Portacath or PICC line.   We strive to give you quality time with your provider. You may need to reschedule your appointment if you arrive late (15 or more minutes).  Arriving late affects you and other patients whose appointments are after yours.  Also, if you miss three or more appointments without notifying the office, you may be dismissed from the clinic at the provider's discretion.      For prescription refill requests, have your pharmacy contact our office and allow 72 hours for refills to be completed.    Today you received the following chemotherapy and/or immunotherapy agents Libtayo      To help prevent nausea and vomiting after your treatment, we encourage you to take your nausea medication as directed.  BELOW ARE SYMPTOMS THAT SHOULD BE REPORTED IMMEDIATELY: *FEVER GREATER THAN 100.4 F (38 C) OR HIGHER *CHILLS OR SWEATING *NAUSEA AND VOMITING THAT IS NOT CONTROLLED WITH YOUR NAUSEA MEDICATION *UNUSUAL SHORTNESS OF BREATH *UNUSUAL BRUISING OR BLEEDING *URINARY PROBLEMS (pain or burning when urinating, or frequent urination) *BOWEL PROBLEMS (unusual diarrhea, constipation, pain near the anus) TENDERNESS IN MOUTH AND THROAT WITH OR WITHOUT PRESENCE OF ULCERS (sore throat, sores in mouth, or a toothache) UNUSUAL RASH, SWELLING OR PAIN  UNUSUAL VAGINAL DISCHARGE OR ITCHING   Items with * indicate a potential emergency and should be followed up as soon as possible or go to the Emergency Department if any problems should occur.  Please show the CHEMOTHERAPY ALERT CARD or IMMUNOTHERAPY ALERT CARD at check-in to the  Emergency Department and triage nurse.  Should you have questions after your visit or need to cancel or reschedule your appointment, please contact [facility] CANCER [place] AT [place]  [phone] and follow the prompts.  Office hours are 8:00 a.m. to 4:30 p.m. Monday - Friday. Please note that voicemails left after 4:00 p.m. may not be returned until the following business day.  We are closed weekends and major holidays. You have access to a nurse at all times for urgent questions. Please call the main number to the clinic [phone] and follow the prompts.  For any non-urgent questions, you may also contact your provider using MyChart. We now offer e-Visits for anyone 63 and older to request care online for non-urgent symptoms. For details visit [email or web address].   Also download the MyChart app! Go to the app store, search "MyChart", open the app, select [place], and log in with your MyChart username and password.  Masks are optional in the cancer centers. If you would like for your care team to wear a mask while they are taking care of you, please let them know. For doctor visits, patients may have with them one support person who is at least 60 years old. At this time, visitors are not allowed in the infusion area.

## 2022-05-30 NOTE — Progress Notes (Signed)
[facility] CONSULT NOTE  Patient Care Team: Casilda Carls, MD as PCP - General (Internal Medicine) Lorretta Harp, MD as PCP - Cardiology (Cardiology) Curt Bears, MD as Consulting Physician (Oncology) Casilda Carls, MD as Referring Physician (Internal Medicine) Cammie Sickle, MD as Consulting Physician (Internal Medicine) Cammie Sickle, MD as Consulting Physician (Internal Medicine)  CHIEF COMPLAINTS/PURPOSE OF CONSULTATION: lung cancer  #  Oncology History Overview Note  DIAGNOSIS: 1) stage IV (T1c, N0, M1 C) non-small cell lung cancer favoring adenocarcinoma presented with right lung apical nodule in addition to metastatic disease in the right hepatic lobe and the proximal right femoral diaphysis diagnosed and December 2022. 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    QNS; Guardant 360 showed positive KRAS G12C mutation  FINAL MICROSCOPIC DIAGNOSIS:   A. LUNG, RUL, FINE NEEDLE ASPIRATION:  - Malignant cells consistent with non-small cell carcinoma, see comment   B. LUNG, RUL, BRUSHING:  - Malignant cells consistent with non-small cell carcinoma, see comment       COMMENT:   A and B.  Dr. Saralyn Pilar reviewed the case and concurs with the diagnosis.  Only rare malignant cells are present on the cellblock.  Immunohistochemical stains were attempted and show that the tumor cells  have patchy staining for TTF-1 whereas p63, p40 and CK5/6 are negative.  The findings are nondiagnostic but suggestive of a lung adenocarcinoma.  Dr. Lamonte Sakai was notified on 10/13/2021.   SEP-OCT 2022-  [Dermatology]-   Poorly differentiated squamous cell carcinoma; -  Carcinoma extends to the edges of the excision specimen   IMPRESSION: 1. The spiculated nodule at the right lung apex on recent neck CT is hypermetabolic and is concerning for primary bronchogenic  carcinoma. Tissue sampling recommended. 2. Hypermetabolic activity within the occipital scalp and small previously demonstrated right occipital lymph node compatible with known squamous cell carcinoma. Evaluation of the head and neck limited by motion artifact. 3. Hypermetabolic lesions inferiorly in the right hepatic lobe and in the proximal right femoral diaphysis suspicious for metastatic disease, primary uncertain in this patient with a history of prostate cancer. Correlate with PSA levels. Abdominal MRI without and with contrast may be helpful for further characterization of the liver lesion.  # s/p RT tto RUL [[place]]; right hip RT;   # 2007MI-CAD [s/p stent-Dr.Berry; EF 2021- 58%]     Primary adenocarcinoma of upper lobe of right lung ([place])  10/18/2021 Initial Diagnosis   Primary adenocarcinoma of upper lobe of right lung ([place])   10/18/2021 Cancer Staging   Staging form: Lung, AJCC 8th Edition - Clinical: Stage IVB (cT1c, cN0, cM1c) - Signed by Curt Bears, MD on 10/18/2021   11/01/2021 -  Chemotherapy   Patient is on Treatment Plan : LUNG NSCLC Pemetrexed + Carboplatin q21d x 4 Cycles      HISTORY OF PRESENTING ILLNESS: Ambulating independently.  Accompanied by his wife.   Chris [name] 61 y.o.  male metastatic stage IV non-small cell lung cancer/favor adenocarcinoma-currently on maintained libatyo here for follow-up/review the results of the restaging scans-CT scan bone scan  Complains of ongoing reflux/burping.  He is on Prilosec 20 mg once a day.  Complains of hoarseness of voice.  Denies any fever chills.  No nausea no vomiting.   Continues to complain of hip pain-right side.  However not worse.  Review of Systems  Constitutional:  Positive for malaise/fatigue. Negative for chills, diaphoresis, fever  and weight loss.  HENT:  Positive for hearing loss. Negative for nosebleeds and sore throat.   Eyes:  Negative for double vision.  Respiratory:  Negative for  cough, hemoptysis, sputum production, shortness of breath and wheezing.   Cardiovascular:  Negative for chest pain, palpitations, orthopnea and leg swelling.  Gastrointestinal:  Negative for abdominal pain, blood in stool, diarrhea, heartburn, melena, nausea and vomiting.  Genitourinary:  Negative for dysuria, frequency and urgency.  Musculoskeletal:  Negative for back pain.  Skin: Negative.  Negative for itching and rash.  Neurological:  Negative for dizziness, tingling, focal weakness, weakness and headaches.  Endo/Heme/Allergies:  Does not bruise/bleed easily.  Psychiatric/Behavioral:  Negative for depression. The patient does not have insomnia.      MEDICAL HISTORY:  Past Medical History:  Diagnosis Date   Anxiety    associated with medical care,  worsened by difficulty hearing, does better with wife present   Complication of anesthesia    wife states very anxious may need pre sedation   Coronary artery disease    GERD (gastroesophageal reflux disease)    Hearing loss    mild per wife   Hearing loss    no hearing aids per wife   Hyperlipidemia    Hypertension    MI (myocardial infarction) ([place]) 04/17/2006   Acute inferolateral wall MI with cardiogenic shock and complete heart block   Primary adenocarcinoma of upper lobe of right lung ([place])    Prostate cancer ([place])    Tobacco abuse    Wears glasses    Wears partial dentures    Upper    SURGICAL HISTORY: Past Surgical History:  Procedure Laterality Date   BRONCHIAL BIOPSY  10/09/2021   Procedure: BRONCHIAL BIOPSIES;  Surgeon: Collene Gobble, MD;  Location: [facility];  Service: Pulmonary;;   BRONCHIAL BRUSHINGS  10/09/2021   Procedure: BRONCHIAL BRUSHINGS;  Surgeon: Collene Gobble, MD;  Location: [facility] ENDOSCOPY;  Service: Pulmonary;;   BRONCHIAL NEEDLE ASPIRATION BIOPSY  10/09/2021   Procedure: BRONCHIAL NEEDLE ASPIRATION BIOPSIES;  Surgeon: Collene Gobble, MD;  Location: [facility] ENDOSCOPY;  Service: Pulmonary;;   CARDIAC  CATHETERIZATION  2007   left, RCA 100% occluded ruptured plaque with thrombus in the proximal segment   CYST EXCISION N/A 08/30/2021   Procedure: EXCISION OF POSTERIOR SCALP CYST;  Surgeon: Clovis Riley, MD;  Location: [facility];  Service: General;  Laterality: N/A;   CYSTOSCOPY N/A 07/17/2018   Procedure: Consuela Mimes;  Surgeon: Franchot Gallo, MD;  Location: [facility];  Service: Urology;  Laterality: N/A;  no seeds in bladder per Dr Diona Fanti   FIDUCIAL MARKER PLACEMENT  10/09/2021   Procedure: FIDUCIAL MARKER PLACEMENT;  Surgeon: Collene Gobble, MD;  Location: [facility] ENDOSCOPY;  Service: Pulmonary;;   HAND SURGERY Right    has metal plate in arm   PROSTATE BIOPSY     RADIOACTIVE SEED IMPLANT N/A 07/17/2018   Procedure: RADIOACTIVE SEED IMPLANT/BRACHYTHERAPY IMPLANT;  Surgeon: Franchot Gallo, MD;  Location: [facility];  Service: Urology;  Laterality: N/A;  77 seeds   SPACE OAR INSTILLATION N/A 07/17/2018   Procedure: SPACE OAR INSTILLATION;  Surgeon: Franchot Gallo, MD;  Location: [facility];  Service: Urology;  Laterality: N/A;   VIDEO BRONCHOSCOPY WITH RADIAL ENDOBRONCHIAL ULTRASOUND  10/09/2021   Procedure: VIDEO BRONCHOSCOPY WITH RADIAL ENDOBRONCHIAL ULTRASOUND;  Surgeon: Collene Gobble, MD;  Location: [facility];  Service: Pulmonary;;   WRIST SURGERY  10/04/2012    SOCIAL HISTORY: Social  History   Socioeconomic History   Marital status: Married    Spouse name: Not on file   Number of children: Not on file   Years of education: Not on file   Highest education level: Not on file  Occupational History   Not on file  Tobacco Use   Smoking status: Every Day    Packs/day: 1.50    Years: 42.00    Total pack years: 63.00    Types: Cigarettes   Smokeless tobacco: Never  Vaping Use   Vaping Use: Never used  Substance and Sexual Activity   Alcohol use: No   Drug use: No   Sexual activity: Yes    Birth  control/protection: None  Other Topics Concern   Not on file  Social History Narrative   10-04 19 Unable to ask abuse questions wife with him today.   Social Determinants of Health   Financial Resource Strain: Not on file  Food Insecurity: Not on file  Transportation Needs: Not on file  Physical Activity: Not on file  Stress: Not on file  Social Connections: Not on file  Intimate Partner Violence: Not on file    FAMILY HISTORY: Family History  Problem Relation Age of Onset   Breast cancer Mother    Prostate cancer Neg Hx    Kidney cancer Neg Hx    Cancer Neg Hx     ALLERGIES:  has No Known Allergies.  MEDICATIONS:  Current Outpatient Medications  Medication Sig Dispense Refill   ALPRAZolam (XANAX) 0.25 MG tablet Take 1 tablet (0.25 mg total) by mouth at bedtime. 15 tablet 0   atorvastatin (LIPITOR) 20 MG tablet Take 1 tablet (20 mg total) by mouth daily. 90 tablet 3   clopidogrel (PLAVIX) 75 MG tablet Take 1 tablet (75 mg total) by mouth daily. 90 tablet 3   ergocalciferol (VITAMIN D2) 1.25 MG (50000 UT) capsule Take 1 capsule (50,000 Units total) by mouth once a week. 12 capsule 1   ibuprofen (ADVIL) 400 MG tablet Take 1 tablet (400 mg total) by mouth every 6 (six) hours as needed. 60 tablet 2   ketoconazole (NIZORAL) 2 % cream Apply 1 application. topically 2 (two) times daily as needed for irritation.     metoprolol tartrate (LOPRESSOR) 25 MG tablet Take 1 tablet (25 mg total) by mouth daily. 90 tablet 3   omeprazole (PRILOSEC OTC) 20 MG tablet Take 20 mg by mouth daily.     tamsulosin (FLOMAX) 0.4 MG CAPS capsule Take 1 capsule (0.4 mg total) by mouth daily. 30 capsule 3   folic acid (FOLVITE) 1 MG tablet Take 1 tablet (1 mg total) by mouth daily. 30 tablet 4   pantoprazole (PROTONIX) 40 MG tablet Take 1 tablet (40 mg total) by mouth daily. 60 tablet 1   prochlorperazine (COMPAZINE) 10 MG tablet Take 1 tablet (10 mg total) by mouth every 6 (six) hours as needed for  nausea or vomiting. (Patient not taking: Reported on 05/09/2022) 30 tablet 1   No current facility-administered medications for this visit.      Marland Kitchen  PHYSICAL EXAMINATION: ECOG PERFORMANCE STATUS: 1 - Symptomatic but completely ambulatory  Vitals:   05/30/22 1009  BP: 120/72  Pulse: 60  Temp: (!) 96.5 F (35.8 C)  SpO2: 100%   Filed Weights   05/30/22 1009  Weight: 245 lb 9.6 oz (111.4 kg)    Physical Exam Vitals and nursing note reviewed.  HENT:     Head: Normocephalic and atraumatic.  Mouth/Throat:     Pharynx: Oropharynx is clear.  Eyes:     Extraocular Movements: Extraocular movements intact.     Pupils: Pupils are equal, round, and reactive to light.  Cardiovascular:     Rate and Rhythm: Normal rate and regular rhythm.  Pulmonary:     Comments: Decreased breath sounds bilaterally.  Abdominal:     Palpations: Abdomen is soft.  Musculoskeletal:        General: Normal range of motion.     Cervical back: Normal range of motion.  Skin:    General: Skin is warm.  Neurological:     General: No focal deficit present.     Mental Status: He is alert and oriented to person, place, and time.  Psychiatric:        Behavior: Behavior normal.        Judgment: Judgment normal.      LABORATORY DATA:  I have reviewed the data as listed Lab Results  Component Value Date   WBC 5.5 05/30/2022   HGB 15.4 05/30/2022   HCT 45.3 05/30/2022   MCV 91.7 05/30/2022   PLT 200 05/30/2022   Recent Labs    04/04/22 0835 05/09/22 1047 05/30/22 0953  NA 137 137 138  K 3.8 3.9 4.0  CL 107 105 106  CO2 _0 GLUCOSE 104* 147* 104*  BUN _1 CREATININE 1.03 1.15 1.04  CALCIUM 8.5* 8.7* 9.0  GFRNONAA >60 >60 >60  PROT 7.2 7.1 7.4  ALBUMIN 4.0 4.0 4.3  AST _2 ALT _3 ALKPHOS 72 70 67  BILITOT 0.5 0.6 0.5    RADIOGRAPHIC STUDIES: I have personally reviewed the radiological images as listed and agreed with the findings in the report. NM Bone  Scan Whole Body  Result Date: 05/23/2022 CLINICAL DATA:  History of prostate cancer and metastatic lung cancer. EXAM: NUCLEAR MEDICINE WHOLE BODY BONE SCAN TECHNIQUE: Whole body anterior and posterior images were obtained approximately 3 hours after intravenous injection of radiopharmaceutical. RADIOPHARMACEUTICALS:  21.6 mCi Technetium-53mMDP IV COMPARISON:  CT chest abdomen pelvis dated May 23, 2022 and PET-CT dated September 28, 2021 FINDINGS: Subtle focus of abnormal radiotracer uptake in the proximal right femur corresponds with the FDG avid focus seen on prior PET-CT. Multifocal radiotracer uptake involving the shoulders, spine, ankles, knees, hips in a pattern most consistent with degenerative arthropathy. Otherwise physiologic distribution of radiotracer activity. IMPRESSION: Subtle focus of abnormal radiotracer uptake in the proximal right femur corresponds with the FDG avid focus seen on prior PET-CT and is again suspicious for osseous metastatic disease. Electronically Signed   By: JDahlia BailiffM.D.   On: 05/23/2022 13:45   CT CHEST ABDOMEN PELVIS W CONTRAST  Result Date: 05/23/2022 CLINICAL DATA:  Non-small cell lung cancer, metastatic. Assess treatment response. * Tracking Code: BO * EXAM: CT CHEST, ABDOMEN, AND PELVIS WITH CONTRAST TECHNIQUE: Multidetector CT imaging of the chest, abdomen and pelvis was performed following the standard protocol during bolus administration of intravenous contrast. RADIATION DOSE REDUCTION: This exam was performed according to the departmental dose-optimization program which includes automated exposure control, adjustment of the mA and/or kV according to patient size and/or use of iterative reconstruction technique. CONTRAST:  1088mOMNIPAQUE IOHEXOL 300 MG/ML  SOLN COMPARISON:  Multiple priors including most recent CT chest abdomen pelvis February 16, 2022 and nuclear medicine bone scan May 23, 2022. FINDINGS: CT CHEST FINDINGS Cardiovascular: Aortic  atherosclerosis. Coronary artery stents/calcifications. Normal size  heart. No central pulmonary embolus on this nondedicated study. No significant pericardial effusion/thickening. Mediastinum/Nodes: No suspicious thyroid nodule. No pathologically enlarged mediastinal, hilar or axillary lymph nodes. Esophagus is grossly unremarkable. Lungs/Pleura: Spiculated right upper lobe pulmonary nodule measures 17 x 10 mm on image 21/5 previously 24 x 14 mm. Adjacent fiducial marker clip. Slightly increased interlobular septal thickening and architectural distortion in the right lung apex favored to reflect posttreatment change. No new suspicious pulmonary nodules or masses. No pleural effusion. No pneumothorax. Musculoskeletal: No aggressive lytic or blastic lesion of bone. CT ABDOMEN PELVIS FINDINGS Hepatobiliary: Very subtle hypodense hepatic segment V lesion is again difficult to accurately characterize but measures approximally 15 x 11 mm on image 67/2 previously 16 x 13 mm. No new suspicious hepatic lesion identified. Gallbladder is unremarkable. No biliary ductal dilation. Pancreas: No pancreatic ductal dilation or evidence of acute inflammation. Spleen: No splenomegaly. Adrenals/Urinary Tract: Bilateral adrenal nodules are stable and were previously characterized as benign adenomas on prior abdominal MRI October 05, 2021 and PET-CT September 28, 2021 and requiring no imaging follow-up. No hydronephrosis. Kidneys demonstrate symmetric enhancement and excretion of contrast material. Urinary bladder is unremarkable for degree of distension. Stomach/Bowel: Radiopaque enteric contrast material traverses distal loops of small bowel. Stomach is unremarkable for degree of distension. No pathologic dilation of small or large bowel. The appendix and terminal ileum appear normal. No evidence of acute bowel inflammation. Sigmoid colonic diverticulosis without findings of acute diverticulitis. Vascular/Lymphatic: Aortic  atherosclerosis without abdominal aortic aneurysm. No pathologically enlarged abdominal or pelvic lymph nodes. Reproductive: Brachytherapy seeds in the prostate gland. Other: No significant abdominopelvic free fluid. Musculoskeletal: No aggressive lytic or blastic lesion of bone. Multilevel degenerative changes spine. Degenerative change of the bilateral hips and SI joints. IMPRESSION: 1. Decreased size of the spiculated pulmonary nodule in the right lung apex consistent with treatment response. 2. Increased interlobular septal thickening and architectural distortion in the right lung apex reflecting posttreatment sequela. 3. Slight interval decrease in size of the very subtle hypodense segment V hepatic metastatic lesion is again difficult to accurately characterize and is better seen on prior MRI and PET-CT. 4. No evidence of new or progressive metastatic disease in the chest, abdomen or pelvis. 5. Aortic Atherosclerosis (ICD10-I70.0) and Emphysema (ICD10-J43.9). Electronically Signed   By: Dahlia Bailiff M.D.   On: 05/23/2022 13:39    ASSESSMENT & PLAN:   Primary adenocarcinoma of upper lobe of right lung ([place]) # Non-small cell lung cancer/stage IV-favor adeno carcinoma-metastases to right femur/liver; synchronous squamous cell carcinoma scalp. S/p  carbo-[place]-Libtayo [cycle #4]; urrently on maintanence Libatayo. JULY 30th, 2023-  Decreased size of the spiculated pulmonary nodule in the right lung; right lung Radiation changes; slight decrease in the liver lesion.  No new disease noted.  # Proceed with  Single agent Libtayo maintenance- Labs today reviewed;  acceptable for treatment today.    # right hip pain-status post radiation [[place]]-Bone scan- subtle uptake Okay to proceed with NSAIDs as needed. Discussed re: referral to [facility]; wants to wait.   # Diarrhea- 2-3 times day-2-3 days; no medications- currently resolved. Monitor closely on immunotherapy.   # Low vit D- [July 2023- vit D- 25];  sent script for ergocalciferol  # Squamous cell carcinoma of the scalp-s/p excision; positive margins- on libtayo-no clinical evidence of progression.  Might need to consider radiation-if any local progression noted/also based on course lung cancer.  STABLE.   # GERD- on prilosec recommend PPI BID prior to meals.    #  IV access: PIV  # DISPOSITION: #Libtayo;- treatment  Today.  # follow up in 3 weeks- MD;labs- cbc/cmp; Libtayo;- Dr.B  # I reviewed the blood work- with the patient in detail; also reviewed the imaging independently [as summarized above]; and with the patient in detail.            All questions were answered. The patient knows to call the clinic with any problems, questions or concerns.       Cammie Sickle, MD 05/30/2022 10:43 AM

## 2022-05-31 ENCOUNTER — Encounter: Payer: Self-pay | Admitting: Hospice and Palliative Medicine

## 2022-06-04 ENCOUNTER — Telehealth: Payer: BC Managed Care – PPO | Admitting: Hospice and Palliative Medicine

## 2022-06-05 ENCOUNTER — Telehealth: Payer: BC Managed Care – PPO | Admitting: Hospice and Palliative Medicine

## 2022-06-12 ENCOUNTER — Telehealth: Payer: Self-pay | Admitting: *Deleted

## 2022-06-12 ENCOUNTER — Telehealth: Payer: Self-pay | Admitting: Internal Medicine

## 2022-06-12 NOTE — Telephone Encounter (Signed)
Did Dr. B send in the prednisone rx?  I don't see it on his med list.

## 2022-06-12 NOTE — Telephone Encounter (Signed)
Wife called reporting that the directions on the bottle of prednisone are different that what patient was verbally told in office per [name], wife and she is asking for clarification

## 2022-06-12 NOTE — Telephone Encounter (Signed)
Sorry, wrong patient,

## 2022-06-12 NOTE — Telephone Encounter (Signed)
Wife called stating that she is positive for COVID as of today. She is concerned that patient has no taste sensation an would like patient to get PCR tested. She has sent him to our office to be seen and he was told to continue to test at home with home test which was already done and was negative. Please adivse if we can order a PCR test for him

## 2022-06-12 NOTE — Telephone Encounter (Signed)
Spoke with wife and notified her that a written order for a COVID test would be left at the front desk of the cancer center for her or pt to pick or family member to pick up.  RN requested for pt or wife to call cancer center when results were found out.  Wife verbalized understanding.

## 2022-06-12 NOTE — Telephone Encounter (Signed)
error 

## 2022-06-13 ENCOUNTER — Other Ambulatory Visit: Payer: Self-pay | Admitting: Internal Medicine

## 2022-06-13 NOTE — Telephone Encounter (Signed)
I spoke to wife, and she clarified that patient has not been PCR tested yet, as he is out of town. He had negative home rapid tests. Advised her that he should get the PCR. Wife stated that pt will be home today and he will stop by the clinic and pick up the Rx for PCR testing. Advised wife/pt to call us with results. She verbalized understanding.

## 2022-06-14 ENCOUNTER — Encounter: Payer: Self-pay | Admitting: Hospice and Palliative Medicine

## 2022-06-14 DIAGNOSIS — Z20822 Contact with and (suspected) exposure to covid-19: Secondary | ICD-10-CM | POA: Diagnosis not present

## 2022-06-14 DIAGNOSIS — Z03818 Encounter for observation for suspected exposure to other biological agents ruled out: Secondary | ICD-10-CM | POA: Diagnosis not present

## 2022-06-14 NOTE — Telephone Encounter (Signed)
1140 am- called pt's wife to follow-up on the covid testing results. Pt's pcr preliminary tested negative. Wife is waiting on the remaining result. She will send the mychart msg to our team to let our office know the final results. Wife understands that if pt tests positive, then a mychart visit will be needed to discuss interventions. If this test is negative and pt develops symptoms, pt will need to recheck the pcr either at [facility]. Wife concerned that tx next wk will need to be post poned. She is concerned pt may still get covid next week despite their effort to keep isolated from each other.   I encouraged wife to keep apts as sch. Next week. We can revaluate next week. Wife thanked me for calling her.

## 2022-06-15 ENCOUNTER — Encounter: Payer: Self-pay | Admitting: Hospice and Palliative Medicine

## 2022-06-19 ENCOUNTER — Encounter: Payer: Self-pay | Admitting: Internal Medicine

## 2022-06-19 ENCOUNTER — Encounter: Payer: Self-pay | Admitting: Hospice and Palliative Medicine

## 2022-06-20 ENCOUNTER — Inpatient Hospital Stay: Payer: BC Managed Care – PPO

## 2022-06-20 ENCOUNTER — Inpatient Hospital Stay (HOSPITAL_BASED_OUTPATIENT_CLINIC_OR_DEPARTMENT_OTHER): Payer: BC Managed Care – PPO | Admitting: Internal Medicine

## 2022-06-20 ENCOUNTER — Encounter: Payer: Self-pay | Admitting: Internal Medicine

## 2022-06-20 VITALS — BP 124/70 | HR 60 | Temp 96.2°F | Resp 18 | Wt 244.2 lb

## 2022-06-20 DIAGNOSIS — C3411 Malignant neoplasm of upper lobe, right bronchus or lung: Secondary | ICD-10-CM

## 2022-06-20 DIAGNOSIS — F17210 Nicotine dependence, cigarettes, uncomplicated: Secondary | ICD-10-CM | POA: Diagnosis not present

## 2022-06-20 DIAGNOSIS — C4442 Squamous cell carcinoma of skin of scalp and neck: Secondary | ICD-10-CM | POA: Diagnosis not present

## 2022-06-20 DIAGNOSIS — Z8546 Personal history of malignant neoplasm of prostate: Secondary | ICD-10-CM | POA: Diagnosis not present

## 2022-06-20 DIAGNOSIS — Z79899 Other long term (current) drug therapy: Secondary | ICD-10-CM | POA: Diagnosis not present

## 2022-06-20 DIAGNOSIS — Z923 Personal history of irradiation: Secondary | ICD-10-CM | POA: Diagnosis not present

## 2022-06-20 DIAGNOSIS — K219 Gastro-esophageal reflux disease without esophagitis: Secondary | ICD-10-CM | POA: Diagnosis not present

## 2022-06-20 DIAGNOSIS — C7951 Secondary malignant neoplasm of bone: Secondary | ICD-10-CM | POA: Diagnosis not present

## 2022-06-20 DIAGNOSIS — C787 Secondary malignant neoplasm of liver and intrahepatic bile duct: Secondary | ICD-10-CM | POA: Diagnosis not present

## 2022-06-20 DIAGNOSIS — Z5111 Encounter for antineoplastic chemotherapy: Secondary | ICD-10-CM | POA: Diagnosis not present

## 2022-06-20 DIAGNOSIS — R490 Dysphonia: Secondary | ICD-10-CM | POA: Diagnosis not present

## 2022-06-20 LAB — CBC WITH DIFFERENTIAL/PLATELET
Abs Immature Granulocytes: 0.01 10*3/uL (ref 0.00–0.07)
Basophils Absolute: 0 10*3/uL (ref 0.0–0.1)
Basophils Relative: 0 %
Eosinophils Absolute: 0.1 10*3/uL (ref 0.0–0.5)
Eosinophils Relative: 1 %
HCT: 46.4 % (ref 39.0–52.0)
Hemoglobin: 15.4 g/dL (ref 13.0–17.0)
Immature Granulocytes: 0 %
Lymphocytes Relative: 21 %
Lymphs Abs: 1.2 10*3/uL (ref 0.7–4.0)
MCH: 30.7 pg (ref 26.0–34.0)
MCHC: 33.2 g/dL (ref 30.0–36.0)
MCV: 92.4 fL (ref 80.0–100.0)
Monocytes Absolute: 0.6 10*3/uL (ref 0.1–1.0)
Monocytes Relative: 12 %
Neutro Abs: 3.5 10*3/uL (ref 1.7–7.7)
Neutrophils Relative %: 66 %
Platelets: 198 10*3/uL (ref 150–400)
RBC: 5.02 MIL/uL (ref 4.22–5.81)
RDW: 13.7 % (ref 11.5–15.5)
WBC: 5.4 10*3/uL (ref 4.0–10.5)
nRBC: 0 % (ref 0.0–0.2)

## 2022-06-20 LAB — COMPREHENSIVE METABOLIC PANEL
ALT: 23 U/L (ref 0–44)
AST: 19 U/L (ref 15–41)
Albumin: 4.1 g/dL (ref 3.5–5.0)
Alkaline Phosphatase: 70 U/L (ref 38–126)
Anion gap: 8 (ref 5–15)
BUN: 17 mg/dL (ref 8–23)
CO2: 26 mmol/L (ref 22–32)
Calcium: 8.9 mg/dL (ref 8.9–10.3)
Chloride: 105 mmol/L (ref 98–111)
Creatinine, Ser: 1.21 mg/dL (ref 0.61–1.24)
GFR, Estimated: >60 mL/min (ref >60)
Glucose, Bld: 113 mg/dL — ABNORMAL HIGH (ref 70–99)
Potassium: 4.5 mmol/L (ref 3.5–5.1)
Sodium: 139 mmol/L (ref 135–145)
Total Bilirubin: 0.5 mg/dL (ref 0.3–1.2)
Total Protein: 7.5 g/dL (ref 6.5–8.1)

## 2022-06-20 MED ORDER — SODIUM CHLORIDE 0.9 % IV SOLN
350.0000 mg | Freq: Once | INTRAVENOUS | Status: AC
  Administered 2022-06-20: 350 mg via INTRAVENOUS
  Filled 2022-06-20: qty 7

## 2022-06-20 MED ORDER — SODIUM CHLORIDE 0.9 % IV SOLN
Freq: Once | INTRAVENOUS | Status: AC
  Filled 2022-06-20: qty 250

## 2022-06-20 NOTE — Assessment & Plan Note (Signed)
#   Non-small cell lung cancer/stage IV-favor adeno carcinoma-metastases to right femur/liver; synchronous squamous cell carcinoma scalp. S/p  carbo-Alimta-Libtayo [cycle #4]; urrently on maintanence Libatayo. JULY 30th, 2023-  Decreased size of the spiculated pulmonary nodule in the right lung; right lung Radiation changes; slight decrease in the liver lesion.  No new disease noted.  # Proceed with Single agent Libtayo maintenance- Labs today reviewed;  acceptable for treatment today.    # right hip pain-status post radiation [[place]]-Bone scan- subtle uptake Okay to proceed with NSAIDs as needed. Discussed re: referral to [facility]; wants to wait. STABLE.    # Low vit D- [July 2023- vit D- 25]; on  ergocalciferol- STABLE.    # Squamous cell carcinoma of the scalp-s/p excision; positive margins- on libtayo-no clinical evidence of progression.  Might need to consider radiation-if any local progression noted/also based on course lung cancer.  STABLE.    # GERD- on prilosec recommend PPI BID prior to meals- STABLE.    # IV access: PIV  # DISPOSITION: #Libtayo;- treatment  Today.  # follow up in 3 weeks- MD;labs- cbc/cmp; Libtayo;- Dr.B

## 2022-06-20 NOTE — Progress Notes (Unsigned)
Patient had dizzy episode yesterday.  Not sleeping well at night.  Has a CPAP machine but does not use as directed.

## 2022-06-20 NOTE — Progress Notes (Signed)
OFF PATHWAY REGIMEN - Non-Small Cell Lung  No Change  Continue With Treatment as Ordered.  Original Decision Date/Time: 10/18/2021 11:38   [id number]:Carboplatin AUC=5 + Pemetrexed 500 mg/m2 q21 Days:   A cycle is every 21 days:     Pemetrexed      Carboplatin   **Always confirm dose/schedule in your pharmacy ordering system**  Patient Characteristics: Stage IV Metastatic, Nonsquamous, Did Not Order Molecular Analysis/Quantity Not Sufficient for Molecular Analysis Therapeutic Status: Stage IV Metastatic Histology: Nonsquamous Cell Broad Molecular Profiling Status: Quantity Not Sufficient for Molecular Analysis  Intent of Therapy: Non-Curative / Palliative Intent, Discussed with Patient

## 2022-06-20 NOTE — Patient Instructions (Signed)
[facility] CANCER CTR AT [place]  Discharge Instructions: Thank you for choosing [facility] to provide your oncology and hematology care.  If you have a lab appointment with the [facility], please go directly to the [facility] and check in at the registration area.  Wear comfortable clothing and clothing appropriate for easy access to any Portacath or PICC line.   We strive to give you quality time with your provider. You may need to reschedule your appointment if you arrive late (15 or more minutes).  Arriving late affects you and other patients whose appointments are after yours.  Also, if you miss three or more appointments without notifying the office, you may be dismissed from the clinic at the provider's discretion.      For prescription refill requests, have your pharmacy contact our office and allow 72 hours for refills to be completed.    Today you received the following chemotherapy and/or immunotherapy agents Libtayo      To help prevent nausea and vomiting after your treatment, we encourage you to take your nausea medication as directed.  BELOW ARE SYMPTOMS THAT SHOULD BE REPORTED IMMEDIATELY: *FEVER GREATER THAN 100.4 F (38 C) OR HIGHER *CHILLS OR SWEATING *NAUSEA AND VOMITING THAT IS NOT CONTROLLED WITH YOUR NAUSEA MEDICATION *UNUSUAL SHORTNESS OF BREATH *UNUSUAL BRUISING OR BLEEDING *URINARY PROBLEMS (pain or burning when urinating, or frequent urination) *BOWEL PROBLEMS (unusual diarrhea, constipation, pain near the anus) TENDERNESS IN MOUTH AND THROAT WITH OR WITHOUT PRESENCE OF ULCERS (sore throat, sores in mouth, or a toothache) UNUSUAL RASH, SWELLING OR PAIN  UNUSUAL VAGINAL DISCHARGE OR ITCHING   Items with * indicate a potential emergency and should be followed up as soon as possible or go to the Emergency Department if any problems should occur.  Please show the CHEMOTHERAPY ALERT CARD or IMMUNOTHERAPY ALERT CARD at check-in to the  Emergency Department and triage nurse.  Should you have questions after your visit or need to cancel or reschedule your appointment, please contact [facility] CANCER [place] AT [place]  [phone] and follow the prompts.  Office hours are 8:00 a.m. to 4:30 p.m. Monday - Friday. Please note that voicemails left after 4:00 p.m. may not be returned until the following business day.  We are closed weekends and major holidays. You have access to a nurse at all times for urgent questions. Please call the main number to the clinic [phone] and follow the prompts.  For any non-urgent questions, you may also contact your provider using MyChart. We now offer e-Visits for anyone 7 and older to request care online for non-urgent symptoms. For details visit [email or web address].   Also download the MyChart app! Go to the app store, search "MyChart", open the app, select [facility], and log in with your MyChart username and password.  Masks are optional in the cancer centers. If you would like for your care team to wear a mask while they are taking care of you, please let them know. For doctor visits, patients may have with them one support person who is at least 61 years old. At this time, visitors are not allowed in the infusion area.

## 2022-06-20 NOTE — Progress Notes (Incomplete)
[facility] CONSULT NOTE  Patient Care Team: Casilda Carls, MD as PCP - General (Internal Medicine) Lorretta Harp, MD as PCP - Cardiology (Cardiology) Curt Bears, MD as Consulting Physician (Oncology) Casilda Carls, MD as Referring Physician (Internal Medicine) Cammie Sickle, MD as Consulting Physician (Internal Medicine) Cammie Sickle, MD as Consulting Physician (Internal Medicine)  CHIEF COMPLAINTS/PURPOSE OF CONSULTATION: lung cancer  #  Oncology History Overview Note  DIAGNOSIS: 1) stage IV (T1c, N0, M1 C) non-small cell lung cancer favoring adenocarcinoma presented with right lung apical nodule in addition to metastatic disease in the right hepatic lobe and the proximal right femoral diaphysis diagnosed and December 2022. 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    QNS; Guardant 360 showed positive KRAS G12C mutation  FINAL MICROSCOPIC DIAGNOSIS:   A. LUNG, RUL, FINE NEEDLE ASPIRATION:  - Malignant cells consistent with non-small cell carcinoma, see comment   B. LUNG, RUL, BRUSHING:  - Malignant cells consistent with non-small cell carcinoma, see comment       COMMENT:   A and B.  Dr. Saralyn Pilar reviewed the case and concurs with the diagnosis.  Only rare malignant cells are present on the cellblock.  Immunohistochemical stains were attempted and show that the tumor cells  have patchy staining for TTF-1 whereas p63, p40 and CK5/6 are negative.  The findings are nondiagnostic but suggestive of a lung adenocarcinoma.  Dr. Lamonte Sakai was notified on 10/13/2021.   SEP-OCT 2022-  [Dermatology]-   Poorly differentiated squamous cell carcinoma; -  Carcinoma extends to the edges of the excision specimen   IMPRESSION: 1. The spiculated nodule at the right lung apex on recent neck CT is hypermetabolic and is concerning for primary bronchogenic  carcinoma. Tissue sampling recommended. 2. Hypermetabolic activity within the occipital scalp and small previously demonstrated right occipital lymph node compatible with known squamous cell carcinoma. Evaluation of the head and neck limited by motion artifact. 3. Hypermetabolic lesions inferiorly in the right hepatic lobe and in the proximal right femoral diaphysis suspicious for metastatic disease, primary uncertain in this patient with a history of prostate cancer. Correlate with PSA levels. Abdominal MRI without and with contrast may be helpful for further characterization of the liver lesion.  # s/p RT tto RUL [[place]]; right hip RT;   # 2007MI-CAD [s/p stent-Dr.Berry; EF 2021- 58%]     Primary adenocarcinoma of upper lobe of right lung ([place])  10/18/2021 Initial Diagnosis   Primary adenocarcinoma of upper lobe of right lung ([place])   10/18/2021 Cancer Staging   Staging form: Lung, AJCC 8th Edition - Clinical: Stage IVB (cT1c, cN0, cM1c) - Signed by Curt Bears, MD on 10/18/2021   11/01/2021 -  Chemotherapy   Patient is on Treatment Plan : LUNG NSCLC Pemetrexed + Carboplatin q21d x 4 Cycles      HISTORY OF PRESENTING ILLNESS: Ambulating independently.  Accompanied by his wife.   Chris [name] 61 y.o.  male metastatic stage IV non-small cell lung cancer/favor adenocarcinoma-currently on maintained libatyo here for follow-up/review the results of the restaging scans-CT scan bone scan  Patient had dizzy episode yesterday.  Not sleeping well at night.  Has a CPAP machine but does not use as directed.    Complains of ongoing reflux/burping.  He is on Prilosec 20 mg once a day.  Complains of hoarseness of voice.  Denies any fever chills.  No nausea no vomiting.   Continues  to complain of hip pain-right side.  However not worse.  Review of Systems  Constitutional:  Positive for malaise/fatigue. Negative for chills, diaphoresis, fever and weight loss.  HENT:  Positive for  hearing loss. Negative for nosebleeds and sore throat.   Eyes:  Negative for double vision.  Respiratory:  Negative for cough, hemoptysis, sputum production, shortness of breath and wheezing.   Cardiovascular:  Negative for chest pain, palpitations, orthopnea and leg swelling.  Gastrointestinal:  Negative for abdominal pain, blood in stool, diarrhea, heartburn, melena, nausea and vomiting.  Genitourinary:  Negative for dysuria, frequency and urgency.  Musculoskeletal:  Negative for back pain.  Skin: Negative.  Negative for itching and rash.  Neurological:  Negative for dizziness, tingling, focal weakness, weakness and headaches.  Endo/Heme/Allergies:  Does not bruise/bleed easily.  Psychiatric/Behavioral:  Negative for depression. The patient does not have insomnia.      MEDICAL HISTORY:  Past Medical History:  Diagnosis Date  . Anxiety    associated with medical care,  worsened by difficulty hearing, does better with wife present  . Complication of anesthesia    wife states very anxious may need pre sedation  . Coronary artery disease   . GERD (gastroesophageal reflux disease)   . Hearing loss    mild per wife  . Hearing loss    no hearing aids per wife  . Hyperlipidemia   . Hypertension   . MI (myocardial infarction) ([place]) 04/17/2006   Acute inferolateral wall MI with cardiogenic shock and complete heart block  . Primary adenocarcinoma of upper lobe of right lung ([place])   . Prostate cancer ([place])   . Tobacco abuse   . Wears glasses   . Wears partial dentures    Upper    SURGICAL HISTORY: Past Surgical History:  Procedure Laterality Date  . BRONCHIAL BIOPSY  10/09/2021   Procedure: BRONCHIAL BIOPSIES;  Surgeon: Collene Gobble, MD;  Location[facility] ENDOSCOPY;  Service: Pulmonary;;  . BRONCHIAL BRUSHINGS  10/09/2021   Procedure: BRONCHIAL BRUSHINGS;  Surgeon: Collene Gobble, MD;  Location: [facility] ENDOSCOPY;  Service: Pulmonary;;  . BRONCHIAL NEEDLE ASPIRATION BIOPSY  10/09/2021    Procedure: BRONCHIAL NEEDLE ASPIRATION BIOPSIES;  Surgeon: Collene Gobble, MD;  Location: [facility] ENDOSCOPY;  Service: Pulmonary;;  . CARDIAC CATHETERIZATION  2007   left, RCA 100% occluded ruptured plaque with thrombus in the proximal segment  . CYST EXCISION N/A 08/30/2021   Procedure: EXCISION OF POSTERIOR SCALP CYST;  Surgeon: Clovis Riley, MD;  Location: [facility] ORS;  Service: General;  Laterality: N/A;  . CYSTOSCOPY N/A 07/17/2018   Procedure: Consuela Mimes;  Surgeon: Franchot Gallo, MD;  Location: [facility];  Service: Urology;  Laterality: N/A;  no seeds in bladder per Dr Diona Fanti  . FIDUCIAL MARKER PLACEMENT  10/09/2021   Procedure: FIDUCIAL MARKER PLACEMENT;  Surgeon: Collene Gobble, MD;  Location: [facility] ENDOSCOPY;  Service: Pulmonary;;  . HAND SURGERY Right    has metal plate in arm  . PROSTATE BIOPSY    . RADIOACTIVE SEED IMPLANT N/A 07/17/2018   Procedure: RADIOACTIVE SEED IMPLANT/BRACHYTHERAPY IMPLANT;  Surgeon: Franchot Gallo, MD;  Location: [facility];  Service: Urology;  Laterality: N/A;  77 seeds  . SPACE OAR INSTILLATION N/A 07/17/2018   Procedure: SPACE OAR INSTILLATION;  Surgeon: Franchot Gallo, MD;  Location: [facility];  Service: Urology;  Laterality: N/A;  . VIDEO BRONCHOSCOPY WITH RADIAL ENDOBRONCHIAL ULTRASOUND  10/09/2021   Procedure: VIDEO BRONCHOSCOPY WITH RADIAL ENDOBRONCHIAL ULTRASOUND;  Surgeon: Collene Gobble, MD;  Location: [facility] ENDOSCOPY;  Service: Pulmonary;;  . WRIST SURGERY  10/04/2012    SOCIAL HISTORY: Social History   Socioeconomic History  . Marital status: Married    Spouse name: Not on file  . Number of children: Not on file  . Years of education: Not on file  . Highest education level: Not on file  Occupational History  . Not on file  Tobacco Use  . Smoking status: Every Day    Packs/day: 1.50    Years: 42.00    Total pack years: 63.00    Types: Cigarettes  . Smokeless tobacco:  Never  Vaping Use  . Vaping Use: Never used  Substance and Sexual Activity  . Alcohol use: No  . Drug use: No  . Sexual activity: Yes    Birth control/protection: None  Other Topics Concern  . Not on file  Social History Narrative   10-04 19 Unable to ask abuse questions wife with him today.   Social Determinants of Health   Financial Resource Strain: Not on file  Food Insecurity: Not on file  Transportation Needs: Not on file  Physical Activity: Not on file  Stress: Not on file  Social Connections: Not on file  Intimate Partner Violence: Not on file    FAMILY HISTORY: Family History  Problem Relation Age of Onset  . Breast cancer Mother   . Prostate cancer Neg Hx   . Kidney cancer Neg Hx   . Cancer Neg Hx     ALLERGIES:  has No Known Allergies.  MEDICATIONS:  Current Outpatient Medications  Medication Sig Dispense Refill  . ALPRAZolam (XANAX) 0.25 MG tablet Take 1 tablet (0.25 mg total) by mouth at bedtime. 15 tablet 0  . atorvastatin (LIPITOR) 20 MG tablet Take 1 tablet (20 mg total) by mouth daily. 90 tablet 3  . clopidogrel (PLAVIX) 75 MG tablet Take 1 tablet (75 mg total) by mouth daily. 90 tablet 3  . ergocalciferol (VITAMIN D2) 1.25 MG (50000 UT) capsule Take 1 capsule (50,000 Units total) by mouth once a week. 12 capsule 1  . ibuprofen (ADVIL) 400 MG tablet Take 1 tablet (400 mg total) by mouth every 6 (six) hours as needed. 60 tablet 2  . ketoconazole (NIZORAL) 2 % cream Apply 1 application. topically 2 (two) times daily as needed for irritation.    . metoprolol tartrate (LOPRESSOR) 25 MG tablet Take 1 tablet (25 mg total) by mouth daily. 90 tablet 3  . pantoprazole (PROTONIX) 40 MG tablet Take 1 tablet (40 mg total) by mouth daily. 60 tablet 1  . sertraline (ZOLOFT) 100 MG tablet TAKE 1 TABLET BY MOUTH EVERY DAY 90 tablet 0  . tamsulosin (FLOMAX) 0.4 MG CAPS capsule Take 1 capsule (0.4 mg total) by mouth daily. 30 capsule 3  . folic acid (FOLVITE) 1 MG tablet  Take 1 tablet (1 mg total) by mouth daily. 30 tablet 4  . omeprazole (PRILOSEC OTC) 20 MG tablet Take 20 mg by mouth daily.    . prochlorperazine (COMPAZINE) 10 MG tablet Take 1 tablet (10 mg total) by mouth every 6 (six) hours as needed for nausea or vomiting. (Patient not taking: Reported on 05/09/2022) 30 tablet 1   No current facility-administered medications for this visit.      Marland Kitchen  PHYSICAL EXAMINATION: ECOG PERFORMANCE STATUS: 1 - Symptomatic but completely ambulatory  Vitals:   06/20/22 0900  BP: 124/70  Pulse: 60  Resp: 18  Temp: (!)  96.2 F (35.7 C)  SpO2: 98%   Filed Weights   06/20/22 0900  Weight: 244 lb 3.2 oz (110.8 kg)    Physical Exam Vitals and nursing note reviewed.  HENT:     Head: Normocephalic and atraumatic.     Mouth/Throat:     Pharynx: Oropharynx is clear.  Eyes:     Extraocular Movements: Extraocular movements intact.     Pupils: Pupils are equal, round, and reactive to light.  Cardiovascular:     Rate and Rhythm: Normal rate and regular rhythm.  Pulmonary:     Comments: Decreased breath sounds bilaterally.  Abdominal:     Palpations: Abdomen is soft.  Musculoskeletal:        General: Normal range of motion.     Cervical back: Normal range of motion.  Skin:    General: Skin is warm.  Neurological:     General: No focal deficit present.     Mental Status: He is alert and oriented to person, place, and time.  Psychiatric:        Behavior: Behavior normal.        Judgment: Judgment normal.      LABORATORY DATA:  I have reviewed the data as listed Lab Results  Component Value Date   WBC 5.4 06/20/2022   HGB 15.4 06/20/2022   HCT 46.4 06/20/2022   MCV 92.4 06/20/2022   PLT 198 06/20/2022   Recent Labs    05/09/22 1047 05/30/22 0953 06/20/22 0912  NA 137 138 139  K 3.9 4.0 4.5  CL 105 106 105  CO2 $Re'24 26 26  'SmS$ GLUCOSE 147* 104* 113*  BUN $Re'16 17 17  'BDz$ CREATININE 1.15 1.04 1.21  CALCIUM 8.7* 9.0 8.9  GFRNONAA >60 >60 >60   PROT 7.1 7.4 7.5  ALBUMIN 4.0 4.3 4.1  AST $Re'22 19 19  'FLn$ ALT $R'22 23 23  'Go$ ALKPHOS 70 67 70  BILITOT 0.6 0.5 0.5     RADIOGRAPHIC STUDIES: I have personally reviewed the radiological images as listed and agreed with the findings in the report. NM Bone Scan Whole Body  Result Date: 05/23/2022 CLINICAL DATA:  History of prostate cancer and metastatic lung cancer. EXAM: NUCLEAR MEDICINE WHOLE BODY BONE SCAN TECHNIQUE: Whole body anterior and posterior images were obtained approximately 3 hours after intravenous injection of radiopharmaceutical. RADIOPHARMACEUTICALS:  21.6 mCi Technetium-36m MDP IV COMPARISON:  CT chest abdomen pelvis dated May 23, 2022 and PET-CT dated September 28, 2021 FINDINGS: Subtle focus of abnormal radiotracer uptake in the proximal right femur corresponds with the FDG avid focus seen on prior PET-CT. Multifocal radiotracer uptake involving the shoulders, spine, ankles, knees, hips in a pattern most consistent with degenerative arthropathy. Otherwise physiologic distribution of radiotracer activity. IMPRESSION: Subtle focus of abnormal radiotracer uptake in the proximal right femur corresponds with the FDG avid focus seen on prior PET-CT and is again suspicious for osseous metastatic disease. Electronically Signed   By: Dahlia Bailiff M.D.   On: 05/23/2022 13:45   CT CHEST ABDOMEN PELVIS W CONTRAST  Result Date: 05/23/2022 CLINICAL DATA:  Non-small cell lung cancer, metastatic. Assess treatment response. * Tracking Code: BO * EXAM: CT CHEST, ABDOMEN, AND PELVIS WITH CONTRAST TECHNIQUE: Multidetector CT imaging of the chest, abdomen and pelvis was performed following the standard protocol during bolus administration of intravenous contrast. RADIATION DOSE REDUCTION: This exam was performed according to the departmental dose-optimization program which includes automated exposure control, adjustment of the mA and/or kV according to patient size and/or use  of iterative reconstruction  technique. CONTRAST:  153mL OMNIPAQUE IOHEXOL 300 MG/ML  SOLN COMPARISON:  Multiple priors including most recent CT chest abdomen pelvis February 16, 2022 and nuclear medicine bone scan May 23, 2022. FINDINGS: CT CHEST FINDINGS Cardiovascular: Aortic atherosclerosis. Coronary artery stents/calcifications. Normal size heart. No central pulmonary embolus on this nondedicated study. No significant pericardial effusion/thickening. Mediastinum/Nodes: No suspicious thyroid nodule. No pathologically enlarged mediastinal, hilar or axillary lymph nodes. Esophagus is grossly unremarkable. Lungs/Pleura: Spiculated right upper lobe pulmonary nodule measures 17 x 10 mm on image 21/5 previously 24 x 14 mm. Adjacent fiducial marker clip. Slightly increased interlobular septal thickening and architectural distortion in the right lung apex favored to reflect posttreatment change. No new suspicious pulmonary nodules or masses. No pleural effusion. No pneumothorax. Musculoskeletal: No aggressive lytic or blastic lesion of bone. CT ABDOMEN PELVIS FINDINGS Hepatobiliary: Very subtle hypodense hepatic segment V lesion is again difficult to accurately characterize but measures approximally 15 x 11 mm on image 67/2 previously 16 x 13 mm. No new suspicious hepatic lesion identified. Gallbladder is unremarkable. No biliary ductal dilation. Pancreas: No pancreatic ductal dilation or evidence of acute inflammation. Spleen: No splenomegaly. Adrenals/Urinary Tract: Bilateral adrenal nodules are stable and were previously characterized as benign adenomas on prior abdominal MRI October 05, 2021 and PET-CT September 28, 2021 and requiring no imaging follow-up. No hydronephrosis. Kidneys demonstrate symmetric enhancement and excretion of contrast material. Urinary bladder is unremarkable for degree of distension. Stomach/Bowel: Radiopaque enteric contrast material traverses distal loops of small bowel. Stomach is unremarkable for degree of distension.  No pathologic dilation of small or large bowel. The appendix and terminal ileum appear normal. No evidence of acute bowel inflammation. Sigmoid colonic diverticulosis without findings of acute diverticulitis. Vascular/Lymphatic: Aortic atherosclerosis without abdominal aortic aneurysm. No pathologically enlarged abdominal or pelvic lymph nodes. Reproductive: Brachytherapy seeds in the prostate gland. Other: No significant abdominopelvic free fluid. Musculoskeletal: No aggressive lytic or blastic lesion of bone. Multilevel degenerative changes spine. Degenerative change of the bilateral hips and SI joints. IMPRESSION: 1. Decreased size of the spiculated pulmonary nodule in the right lung apex consistent with treatment response. 2. Increased interlobular septal thickening and architectural distortion in the right lung apex reflecting posttreatment sequela. 3. Slight interval decrease in size of the very subtle hypodense segment V hepatic metastatic lesion is again difficult to accurately characterize and is better seen on prior MRI and PET-CT. 4. No evidence of new or progressive metastatic disease in the chest, abdomen or pelvis. 5. Aortic Atherosclerosis (ICD10-I70.0) and Emphysema (ICD10-J43.9). Electronically Signed   By: Dahlia Bailiff M.D.   On: 05/23/2022 13:39    ASSESSMENT & PLAN:   No problem-specific Assessment & Plan notes found for this encounter.     All questions were answered. The patient knows to call the clinic with any problems, questions or concerns.       Cammie Sickle, MD 06/20/2022 9:50 AM

## 2022-06-21 ENCOUNTER — Encounter: Payer: Self-pay | Admitting: Internal Medicine

## 2022-06-21 NOTE — Progress Notes (Signed)
[facility] CONSULT NOTE  Patient Care Team: Jadali, Fayegh, MD as PCP - General (Internal Medicine) Berry, Jonathan J, MD as PCP - Cardiology (Cardiology) Mohamed, Mohamed, MD as Consulting Physician (Oncology) Jadali, Fayegh, MD as Referring Physician (Internal Medicine) Brahmanday, Govinda R, MD as Consulting Physician (Internal Medicine) Brahmanday, Govinda R, MD as Consulting Physician (Internal Medicine)  CHIEF COMPLAINTS/PURPOSE OF CONSULTATION: lung cancer  #  Oncology History Overview Note  DIAGNOSIS: 1) stage IV (T1c, N0, M1 C) non-small cell lung cancer favoring adenocarcinoma presented with right lung apical nodule in addition to metastatic disease in the right hepatic lobe and the proximal right femoral diaphysis diagnosed and December 2022. 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    QNS; Guardant 360 showed positive KRAS G12C mutation  FINAL MICROSCOPIC DIAGNOSIS:   A. LUNG, RUL, FINE NEEDLE ASPIRATION:  - Malignant cells consistent with non-small cell carcinoma, see comment   B. LUNG, RUL, BRUSHING:  - Malignant cells consistent with non-small cell carcinoma, see comment       COMMENT:   A and B.  Dr. Patrick reviewed the case and concurs with the diagnosis.  Only rare malignant cells are present on the cellblock.  Immunohistochemical stains were attempted and show that the tumor cells  have patchy staining for TTF-1 whereas p63, p40 and CK5/6 are negative.  The findings are nondiagnostic but suggestive of a lung adenocarcinoma.  Dr. Byrum was notified on 10/13/2021.   SEP-OCT 2022-  [Dermatology]-   Poorly differentiated squamous cell carcinoma; -  Carcinoma extends to the edges of the excision specimen   IMPRESSION: 1. The spiculated nodule at the right lung apex on recent neck CT is hypermetabolic and is concerning for primary bronchogenic  carcinoma. Tissue sampling recommended. 2. Hypermetabolic activity within the occipital scalp and small previously demonstrated right occipital lymph node compatible with known squamous cell carcinoma. Evaluation of the head and neck limited by motion artifact. 3. Hypermetabolic lesions inferiorly in the right hepatic lobe and in the proximal right femoral diaphysis suspicious for metastatic disease, primary uncertain in this patient with a history of prostate cancer. Correlate with PSA levels. Abdominal MRI without and with contrast may be helpful for further characterization of the liver lesion.  # s/p RT tto RUL [[place]]; right hip RT;   # 2007MI-CAD [s/p stent-Dr.Berry; EF 2021- 58%]     Primary adenocarcinoma of upper lobe of right lung (HCC)  10/18/2021 Initial Diagnosis   Primary adenocarcinoma of upper lobe of right lung (HCC)   10/18/2021 Cancer Staging   Staging form: Lung, AJCC 8th Edition - Clinical: Stage IVB (cT1c, cN0, cM1c) - Signed by Mohamed, Mohamed, MD on 10/18/2021   11/01/2021 - 05/30/2022 Chemotherapy   Patient is on Treatment Plan : LUNG NSCLC Pemetrexed + Carboplatin q21d x 4 Cycles     11/01/2021 -  Chemotherapy   Patient is on Treatment Plan : LUNG NSCLC Pemetrexed + Carboplatin q21d x 4 Cycles      HISTORY OF PRESENTING ILLNESS: Ambulating independently.  Accompanied by his wife.   Chris [name] 61 y.o.  male metastatic stage IV non-small cell lung cancer/favor adenocarcinoma-currently on maintained libatyo here for follow-up.   Patient had dizzy episode yesterday.  Not sleeping well at night.  Has a CPAP machine but does not use as directed.     Denies any fever chills.  No nausea no vomiting.  Continues to complain of hip   pain-right side.  However not worse.  Complains of ongoing reflux/burping.  He is on Prilosec 20 mg once a day.  Complains of hoarseness of voice.  Denies any fever chills.  No nausea no vomiting.   Review of Systems   Constitutional:  Positive for malaise/fatigue. Negative for chills, diaphoresis, fever and weight loss.  HENT:  Positive for hearing loss. Negative for nosebleeds and sore throat.   Eyes:  Negative for double vision.  Respiratory:  Negative for cough, hemoptysis, sputum production, shortness of breath and wheezing.   Cardiovascular:  Negative for chest pain, palpitations, orthopnea and leg swelling.  Gastrointestinal:  Negative for abdominal pain, blood in stool, diarrhea, heartburn, melena, nausea and vomiting.  Genitourinary:  Negative for dysuria, frequency and urgency.  Musculoskeletal:  Negative for back pain.  Skin: Negative.  Negative for itching and rash.  Neurological:  Negative for dizziness, tingling, focal weakness, weakness and headaches.  Endo/Heme/Allergies:  Does not bruise/bleed easily.  Psychiatric/Behavioral:  Negative for depression. The patient does not have insomnia.      MEDICAL HISTORY:  Past Medical History:  Diagnosis Date   Anxiety    associated with medical care,  worsened by difficulty hearing, does better with wife present   Complication of anesthesia    wife states very anxious may need pre sedation   Coronary artery disease    GERD (gastroesophageal reflux disease)    Hearing loss    mild per wife   Hearing loss    no hearing aids per wife   Hyperlipidemia    Hypertension    MI (myocardial infarction) ([place]) 04/17/2006   Acute inferolateral wall MI with cardiogenic shock and complete heart block   Primary adenocarcinoma of upper lobe of right lung ([place])    Prostate cancer ([place])    Tobacco abuse    Wears glasses    Wears partial dentures    Upper    SURGICAL HISTORY: Past Surgical History:  Procedure Laterality Date   BRONCHIAL BIOPSY  10/09/2021   Procedure: BRONCHIAL BIOPSIES;  Surgeon: Collene Gobble, MD;  Location: [place];  Service: Pulmonary;;   BRONCHIAL BRUSHINGS  10/09/2021   Procedure: BRONCHIAL BRUSHINGS;  Surgeon: Collene Gobble, MD;  Location: [facility] ENDOSCOPY;  Service: Pulmonary;;   BRONCHIAL NEEDLE ASPIRATION BIOPSY  10/09/2021   Procedure: BRONCHIAL NEEDLE ASPIRATION BIOPSIES;  Surgeon: Collene Gobble, MD;  Location: [place] ENDOSCOPY;  Service: Pulmonary;;   CARDIAC CATHETERIZATION  2007   left, RCA 100% occluded ruptured plaque with thrombus in the proximal segment   CYST EXCISION N/A 08/30/2021   Procedure: EXCISION OF POSTERIOR SCALP CYST;  Surgeon: Clovis Riley, MD;  Location: [facility];  Service: General;  Laterality: N/A;   CYSTOSCOPY N/A 07/17/2018   Procedure: Consuela Mimes;  Surgeon: Franchot Gallo, MD;  Location: [facility];  Service: Urology;  Laterality: N/A;  no seeds in bladder per Dr Diona Fanti   FIDUCIAL MARKER PLACEMENT  10/09/2021   Procedure: FIDUCIAL MARKER PLACEMENT;  Surgeon: Collene Gobble, MD;  Location: [facility] ENDOSCOPY;  Service: Pulmonary;;   HAND SURGERY Right    has metal plate in arm   PROSTATE BIOPSY     RADIOACTIVE SEED IMPLANT N/A 07/17/2018   Procedure: RADIOACTIVE SEED IMPLANT/BRACHYTHERAPY IMPLANT;  Surgeon: Franchot Gallo, MD;  Location: [facility];  Service: Urology;  Laterality: N/A;  77 seeds   SPACE OAR INSTILLATION N/A 07/17/2018   Procedure: SPACE OAR INSTILLATION;  Surgeon: Franchot Gallo, MD;  Location: [place];  Service: Urology;  Laterality: N/A;   VIDEO BRONCHOSCOPY WITH RADIAL ENDOBRONCHIAL ULTRASOUND  10/09/2021   Procedure: VIDEO BRONCHOSCOPY WITH RADIAL ENDOBRONCHIAL ULTRASOUND;  Surgeon: Byrum, Robert S, MD;  Location: [facility] ENDOSCOPY;  Service: Pulmonary;;   WRIST SURGERY  10/04/2012    SOCIAL HISTORY: Social History   Socioeconomic History   Marital status: Married    Spouse name: Not on file   Number of children: Not on file   Years of education: Not on file   Highest education level: Not on file  Occupational History   Not on file  Tobacco Use   Smoking status: Every Day    Packs/day:  1.50    Years: 42.00    Total pack years: 63.00    Types: Cigarettes   Smokeless tobacco: Never  Vaping Use   Vaping Use: Never used  Substance and Sexual Activity   Alcohol use: No   Drug use: No   Sexual activity: Yes    Birth control/protection: None  Other Topics Concern   Not on file  Social History Narrative   10-04 19 Unable to ask abuse questions wife with him today.   Social Determinants of Health   Financial Resource Strain: Not on file  Food Insecurity: Not on file  Transportation Needs: Not on file  Physical Activity: Not on file  Stress: Not on file  Social Connections: Not on file  Intimate Partner Violence: Not on file    FAMILY HISTORY: Family History  Problem Relation Age of Onset   Breast cancer Mother    Prostate cancer Neg Hx    Kidney cancer Neg Hx    Cancer Neg Hx     ALLERGIES:  has No Known Allergies.  MEDICATIONS:  Current Outpatient Medications  Medication Sig Dispense Refill   ALPRAZolam (XANAX) 0.25 MG tablet Take 1 tablet (0.25 mg total) by mouth at bedtime. 15 tablet 0   atorvastatin (LIPITOR) 20 MG tablet Take 1 tablet (20 mg total) by mouth daily. 90 tablet 3   clopidogrel (PLAVIX) 75 MG tablet Take 1 tablet (75 mg total) by mouth daily. 90 tablet 3   ergocalciferol (VITAMIN D2) 1.25 MG (50000 UT) capsule Take 1 capsule (50,000 Units total) by mouth once a week. 12 capsule 1   ibuprofen (ADVIL) 400 MG tablet Take 1 tablet (400 mg total) by mouth every 6 (six) hours as needed. 60 tablet 2   ketoconazole (NIZORAL) 2 % cream Apply 1 application. topically 2 (two) times daily as needed for irritation.     metoprolol tartrate (LOPRESSOR) 25 MG tablet Take 1 tablet (25 mg total) by mouth daily. 90 tablet 3   pantoprazole (PROTONIX) 40 MG tablet Take 1 tablet (40 mg total) by mouth daily. 60 tablet 1   sertraline (ZOLOFT) 100 MG tablet TAKE 1 TABLET BY MOUTH EVERY DAY 90 tablet 0   tamsulosin (FLOMAX) 0.4 MG CAPS capsule Take 1 capsule (0.4  mg total) by mouth daily. 30 capsule 3   folic acid (FOLVITE) 1 MG tablet Take 1 tablet (1 mg total) by mouth daily. 30 tablet 4   omeprazole (PRILOSEC OTC) 20 MG tablet Take 20 mg by mouth daily.     prochlorperazine (COMPAZINE) 10 MG tablet Take 1 tablet (10 mg total) by mouth every 6 (six) hours as needed for nausea or vomiting. (Patient not taking: Reported on 05/09/2022) 30 tablet 1   No current facility-administered medications for this visit.      .    PHYSICAL EXAMINATION: ECOG PERFORMANCE STATUS: 1 - Symptomatic but completely ambulatory  Vitals:   06/20/22 0900  BP: 124/70  Pulse: 60  Resp: 18  Temp: (!) 96.2 F (35.7 C)  SpO2: 98%   Filed Weights   06/20/22 0900  Weight: 244 lb 3.2 oz (110.8 kg)    Physical Exam Vitals and nursing note reviewed.  HENT:     Head: Normocephalic and atraumatic.     Mouth/Throat:     Pharynx: Oropharynx is clear.  Eyes:     Extraocular Movements: Extraocular movements intact.     Pupils: Pupils are equal, round, and reactive to light.  Cardiovascular:     Rate and Rhythm: Normal rate and regular rhythm.  Pulmonary:     Comments: Decreased breath sounds bilaterally.  Abdominal:     Palpations: Abdomen is soft.  Musculoskeletal:        General: Normal range of motion.     Cervical back: Normal range of motion.  Skin:    General: Skin is warm.  Neurological:     General: No focal deficit present.     Mental Status: He is alert and oriented to person, place, and time.  Psychiatric:        Behavior: Behavior normal.        Judgment: Judgment normal.      LABORATORY DATA:  I have reviewed the data as listed Lab Results  Component Value Date   WBC 5.4 06/20/2022   HGB 15.4 06/20/2022   HCT 46.4 06/20/2022   MCV 92.4 06/20/2022   PLT 198 06/20/2022   Recent Labs    05/09/22 1047 05/30/22 0953 06/20/22 0912  NA 137 138 139  K 3.9 4.0 4.5  CL 105 106 105  CO2 _0 GLUCOSE 147* 104* 113*  BUN _1 CREATININE 1.15 1.04 1.21  CALCIUM 8.7* 9.0 8.9  GFRNONAA >60 >60 >60  PROT 7.1 7.4 7.5  ALBUMIN 4.0 4.3 4.1  AST _2 ALT _3 ALKPHOS 70 67 70  BILITOT 0.6 0.5 0.5    RADIOGRAPHIC STUDIES: I have personally reviewed the radiological images as listed and agreed with the findings in the report. NM Bone Scan Whole Body  Result Date: 05/23/2022 CLINICAL DATA:  History of prostate cancer and metastatic lung cancer. EXAM: NUCLEAR MEDICINE WHOLE BODY BONE SCAN TECHNIQUE: Whole body anterior and posterior images were obtained approximately 3 hours after intravenous injection of radiopharmaceutical. RADIOPHARMACEUTICALS:  21.6 mCi Technetium-66mMDP IV COMPARISON:  CT chest abdomen pelvis dated May 23, 2022 and PET-CT dated September 28, 2021 FINDINGS: Subtle focus of abnormal radiotracer uptake in the proximal right femur corresponds with the FDG avid focus seen on prior PET-CT. Multifocal radiotracer uptake involving the shoulders, spine, ankles, knees, hips in a pattern most consistent with degenerative arthropathy. Otherwise physiologic distribution of radiotracer activity. IMPRESSION: Subtle focus of abnormal radiotracer uptake in the proximal right femur corresponds with the FDG avid focus seen on prior PET-CT and is again suspicious for osseous metastatic disease. Electronically Signed   By: JDahlia BailiffM.D.   On: 05/23/2022 13:45   CT CHEST ABDOMEN PELVIS W CONTRAST  Result Date: 05/23/2022 CLINICAL DATA:  Non-small cell lung cancer, metastatic. Assess treatment response. * Tracking Code: BO * EXAM: CT CHEST, ABDOMEN, AND PELVIS WITH CONTRAST TECHNIQUE: Multidetector CT imaging of the chest, abdomen and pelvis was performed following the standard protocol during bolus administration of intravenous contrast. RADIATION DOSE  REDUCTION: This exam was performed according to the departmental dose-optimization program which includes automated exposure control, adjustment of the mA and/or  kV according to patient size and/or use of iterative reconstruction technique. CONTRAST:  100mL OMNIPAQUE IOHEXOL 300 MG/ML  SOLN COMPARISON:  Multiple priors including most recent CT chest abdomen pelvis February 16, 2022 and nuclear medicine bone scan May 23, 2022. FINDINGS: CT CHEST FINDINGS Cardiovascular: Aortic atherosclerosis. Coronary artery stents/calcifications. Normal size heart. No central pulmonary embolus on this nondedicated study. No significant pericardial effusion/thickening. Mediastinum/Nodes: No suspicious thyroid nodule. No pathologically enlarged mediastinal, hilar or axillary lymph nodes. Esophagus is grossly unremarkable. Lungs/Pleura: Spiculated right upper lobe pulmonary nodule measures 17 x 10 mm on image 21/5 previously 24 x 14 mm. Adjacent fiducial marker clip. Slightly increased interlobular septal thickening and architectural distortion in the right lung apex favored to reflect posttreatment change. No new suspicious pulmonary nodules or masses. No pleural effusion. No pneumothorax. Musculoskeletal: No aggressive lytic or blastic lesion of bone. CT ABDOMEN PELVIS FINDINGS Hepatobiliary: Very subtle hypodense hepatic segment V lesion is again difficult to accurately characterize but measures approximally 15 x 11 mm on image 67/2 previously 16 x 13 mm. No new suspicious hepatic lesion identified. Gallbladder is unremarkable. No biliary ductal dilation. Pancreas: No pancreatic ductal dilation or evidence of acute inflammation. Spleen: No splenomegaly. Adrenals/Urinary Tract: Bilateral adrenal nodules are stable and were previously characterized as benign adenomas on prior abdominal MRI October 05, 2021 and PET-CT September 28, 2021 and requiring no imaging follow-up. No hydronephrosis. Kidneys demonstrate symmetric enhancement and excretion of contrast material. Urinary bladder is unremarkable for degree of distension. Stomach/Bowel: Radiopaque enteric contrast material traverses distal loops  of small bowel. Stomach is unremarkable for degree of distension. No pathologic dilation of small or large bowel. The appendix and terminal ileum appear normal. No evidence of acute bowel inflammation. Sigmoid colonic diverticulosis without findings of acute diverticulitis. Vascular/Lymphatic: Aortic atherosclerosis without abdominal aortic aneurysm. No pathologically enlarged abdominal or pelvic lymph nodes. Reproductive: Brachytherapy seeds in the prostate gland. Other: No significant abdominopelvic free fluid. Musculoskeletal: No aggressive lytic or blastic lesion of bone. Multilevel degenerative changes spine. Degenerative change of the bilateral hips and SI joints. IMPRESSION: 1. Decreased size of the spiculated pulmonary nodule in the right lung apex consistent with treatment response. 2. Increased interlobular septal thickening and architectural distortion in the right lung apex reflecting posttreatment sequela. 3. Slight interval decrease in size of the very subtle hypodense segment V hepatic metastatic lesion is again difficult to accurately characterize and is better seen on prior MRI and PET-CT. 4. No evidence of new or progressive metastatic disease in the chest, abdomen or pelvis. 5. Aortic Atherosclerosis (ICD10-I70.0) and Emphysema (ICD10-J43.9). Electronically Signed   By: Jeffrey  Waltz M.D.   On: 05/23/2022 13:39    ASSESSMENT & PLAN:   Primary adenocarcinoma of upper lobe of right lung (HCC) # Non-small cell lung cancer/stage IV-favor adeno carcinoma-metastases to right femur/liver; synchronous squamous cell carcinoma scalp. S/p  carbo-Alim[place]-Libtayo [cycle #4]; urrently on maintanence Libatayo. JULY 30th, 2023-  Decreased size of the spiculated pulmonary nodule in the right lung; right lung Radiation changes; slight decrease in the liver lesion.  No new disease noted.  # Proceed with Single agent Libtayo maintenance- Labs today reviewed;  acceptable for treatment today.    # right hip  pain-status post radiation [[place]]-Bone scan- subtle uptake Okay to proceed with NSAIDs as needed. Discussed re: referral to [facility]; wants to wait. STABLE.    #   Low vit D- [July 2023- vit D- 25]; on  ergocalciferol- STABLE.    # Squamous cell carcinoma of the scalp-s/p excision; positive margins- on libtayo-no clinical evidence of progression.  Might need to consider radiation-if any local progression noted/also based on course lung cancer.  STABLE.    # GERD- on prilosec recommend PPI BID prior to meals- STABLE.    # IV access: PIV  # DISPOSITION: #Libtayo;- treatment  Today.  # follow up in 3 weeks- MD;labs- cbc/cmp; Libtayo;- Dr.B             All questions were answered. The patient knows to call the clinic with any problems, questions or concerns.       Govinda R Brahmanday, MD 06/21/2022 11:44 PM     

## 2022-06-22 ENCOUNTER — Other Ambulatory Visit: Payer: Self-pay | Admitting: *Deleted

## 2022-06-22 MED ORDER — ALPRAZOLAM 0.25 MG PO TABS: 0.2500 mg | ORAL_TABLET | Freq: Every day | ORAL | 0 refills | Status: DC

## 2022-06-23 DIAGNOSIS — G4733 Obstructive sleep apnea (adult) (pediatric): Secondary | ICD-10-CM | POA: Diagnosis not present

## 2022-07-09 ENCOUNTER — Encounter: Payer: Self-pay | Admitting: Hospice and Palliative Medicine

## 2022-07-11 ENCOUNTER — Inpatient Hospital Stay: Payer: BC Managed Care – PPO | Attending: Internal Medicine

## 2022-07-11 ENCOUNTER — Telehealth: Payer: Self-pay | Admitting: *Deleted

## 2022-07-11 ENCOUNTER — Ambulatory Visit
Admission: RE | Admit: 2022-07-11 | Discharge: 2022-07-11 | Disposition: A | Discharge status: Home / Self Care | Payer: BC Managed Care – PPO | Source: Ambulatory Visit | Attending: Internal Medicine | Admitting: Internal Medicine

## 2022-07-11 ENCOUNTER — Other Ambulatory Visit: Payer: Self-pay

## 2022-07-11 ENCOUNTER — Encounter: Payer: Self-pay | Admitting: Hospice and Palliative Medicine

## 2022-07-11 ENCOUNTER — Inpatient Hospital Stay (HOSPITAL_BASED_OUTPATIENT_CLINIC_OR_DEPARTMENT_OTHER): Payer: BC Managed Care – PPO | Admitting: Internal Medicine

## 2022-07-11 ENCOUNTER — Inpatient Hospital Stay: Payer: BC Managed Care – PPO

## 2022-07-11 ENCOUNTER — Inpatient Hospital Stay (HOSPITAL_BASED_OUTPATIENT_CLINIC_OR_DEPARTMENT_OTHER): Payer: BC Managed Care – PPO | Admitting: Hospice and Palliative Medicine

## 2022-07-11 ENCOUNTER — Ambulatory Visit
Admission: RE | Admit: 2022-07-11 | Discharge: 2022-07-11 | Disposition: A | Discharge status: Home / Self Care | Payer: BC Managed Care – PPO | Attending: Internal Medicine | Admitting: Internal Medicine

## 2022-07-11 ENCOUNTER — Encounter: Payer: Self-pay | Admitting: Internal Medicine

## 2022-07-11 VITALS — BP 133/78 | HR 58 | Temp 97.5°F | Resp 20 | Ht 70.0 in | Wt 242.8 lb

## 2022-07-11 DIAGNOSIS — Z5111 Encounter for antineoplastic chemotherapy: Secondary | ICD-10-CM | POA: Insufficient documentation

## 2022-07-11 DIAGNOSIS — M25551 Pain in right hip: Secondary | ICD-10-CM | POA: Diagnosis not present

## 2022-07-11 DIAGNOSIS — Z515 Encounter for palliative care: Secondary | ICD-10-CM | POA: Diagnosis not present

## 2022-07-11 DIAGNOSIS — G893 Neoplasm related pain (acute) (chronic): Secondary | ICD-10-CM | POA: Diagnosis not present

## 2022-07-11 DIAGNOSIS — C3411 Malignant neoplasm of upper lobe, right bronchus or lung: Secondary | ICD-10-CM | POA: Diagnosis not present

## 2022-07-11 DIAGNOSIS — Z79899 Other long term (current) drug therapy: Secondary | ICD-10-CM | POA: Insufficient documentation

## 2022-07-11 DIAGNOSIS — K219 Gastro-esophageal reflux disease without esophagitis: Secondary | ICD-10-CM | POA: Diagnosis not present

## 2022-07-11 DIAGNOSIS — Z8546 Personal history of malignant neoplasm of prostate: Secondary | ICD-10-CM | POA: Insufficient documentation

## 2022-07-11 DIAGNOSIS — C787 Secondary malignant neoplasm of liver and intrahepatic bile duct: Secondary | ICD-10-CM | POA: Insufficient documentation

## 2022-07-11 DIAGNOSIS — C7951 Secondary malignant neoplasm of bone: Secondary | ICD-10-CM | POA: Insufficient documentation

## 2022-07-11 DIAGNOSIS — C4442 Squamous cell carcinoma of skin of scalp and neck: Secondary | ICD-10-CM | POA: Diagnosis not present

## 2022-07-11 DIAGNOSIS — Z923 Personal history of irradiation: Secondary | ICD-10-CM | POA: Insufficient documentation

## 2022-07-11 LAB — COMPREHENSIVE METABOLIC PANEL
ALT: 19 U/L (ref 0–44)
AST: 20 U/L (ref 15–41)
Albumin: 4.1 g/dL (ref 3.5–5.0)
Alkaline Phosphatase: 65 U/L (ref 38–126)
Anion gap: 6 (ref 5–15)
BUN: 19 mg/dL (ref 8–23)
CO2: 22 mmol/L (ref 22–32)
Calcium: 8.8 mg/dL — ABNORMAL LOW (ref 8.9–10.3)
Chloride: 110 mmol/L (ref 98–111)
Creatinine, Ser: 0.96 mg/dL (ref 0.61–1.24)
GFR, Estimated: >60 mL/min (ref >60)
Glucose, Bld: 138 mg/dL — ABNORMAL HIGH (ref 70–99)
Potassium: 3.7 mmol/L (ref 3.5–5.1)
Sodium: 138 mmol/L (ref 135–145)
Total Bilirubin: 0.7 mg/dL (ref 0.3–1.2)
Total Protein: 7.2 g/dL (ref 6.5–8.1)

## 2022-07-11 LAB — CBC WITH DIFFERENTIAL/PLATELET
Abs Immature Granulocytes: 0.02 10*3/uL (ref 0.00–0.07)
Basophils Absolute: 0 10*3/uL (ref 0.0–0.1)
Basophils Relative: 0 %
Eosinophils Absolute: 0.1 10*3/uL (ref 0.0–0.5)
Eosinophils Relative: 1 %
HCT: 45.3 % (ref 39.0–52.0)
Hemoglobin: 15.5 g/dL (ref 13.0–17.0)
Immature Granulocytes: 0 %
Lymphocytes Relative: 17 %
Lymphs Abs: 1 10*3/uL (ref 0.7–4.0)
MCH: 31.3 pg (ref 26.0–34.0)
MCHC: 34.2 g/dL (ref 30.0–36.0)
MCV: 91.3 fL (ref 80.0–100.0)
Monocytes Absolute: 0.7 10*3/uL (ref 0.1–1.0)
Monocytes Relative: 12 %
Neutro Abs: 4.2 10*3/uL (ref 1.7–7.7)
Neutrophils Relative %: 70 %
Platelets: 209 10*3/uL (ref 150–400)
RBC: 4.96 MIL/uL (ref 4.22–5.81)
RDW: 13.8 % (ref 11.5–15.5)
WBC: 6 10*3/uL (ref 4.0–10.5)
nRBC: 0 % (ref 0.0–0.2)

## 2022-07-11 LAB — TSH: TSH: 2.326 u[IU]/mL (ref 0.350–4.500)

## 2022-07-11 MED ORDER — HEPARIN SOD (PORK) LOCK FLUSH 100 UNIT/ML IV SOLN
500.0000 [IU] | Freq: Once | INTRAVENOUS | Status: DC | PRN
  Filled 2022-07-11: qty 5

## 2022-07-11 MED ORDER — MELOXICAM 15 MG PO TABS: 15.0000 mg | ORAL_TABLET | Freq: Every day | ORAL | 2 refills | Status: DC

## 2022-07-11 MED ORDER — SODIUM CHLORIDE 0.9 % IV SOLN
Freq: Once | INTRAVENOUS | Status: AC
  Filled 2022-07-11: qty 250

## 2022-07-11 MED ORDER — TRAMADOL HCL 50 MG PO TABS: 50.0000 mg | ORAL_TABLET | Freq: Four times a day (QID) | ORAL | 0 refills | Status: DC | PRN

## 2022-07-11 MED ORDER — SODIUM CHLORIDE 0.9 % IV SOLN
350.0000 mg | Freq: Once | INTRAVENOUS | Status: AC
  Administered 2022-07-11: 350 mg via INTRAVENOUS
  Filled 2022-07-11: qty 7

## 2022-07-11 NOTE — Progress Notes (Signed)
[facility] CONSULT NOTE  Patient Care Team: Casilda Carls, MD as PCP - General (Internal Medicine) Lorretta Harp, MD as PCP - Cardiology (Cardiology) Curt Bears, MD as Consulting Physician (Oncology) Casilda Carls, MD as Referring Physician (Internal Medicine) Cammie Sickle, MD as Consulting Physician (Internal Medicine) Cammie Sickle, MD as Consulting Physician (Internal Medicine)  CHIEF COMPLAINTS/PURPOSE OF CONSULTATION: lung cancer  #  Oncology History Overview Note  DIAGNOSIS: 1) stage IV (T1c, N0, M1 C) non-small cell lung cancer favoring adenocarcinoma presented with right lung apical nodule in addition to metastatic disease in the right hepatic lobe and the proximal right femoral diaphysis diagnosed and December 2022. 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    QNS; Guardant 360 showed positive KRAS G12C mutation  FINAL MICROSCOPIC DIAGNOSIS:   A. LUNG, RUL, FINE NEEDLE ASPIRATION:  - Malignant cells consistent with non-small cell carcinoma, see comment   B. LUNG, RUL, BRUSHING:  - Malignant cells consistent with non-small cell carcinoma, see comment       COMMENT:   A and B.  Dr. Saralyn Pilar reviewed the case and concurs with the diagnosis.  Only rare malignant cells are present on the cellblock.  Immunohistochemical stains were attempted and show that the tumor cells  have patchy staining for TTF-1 whereas p63, p40 and CK5/6 are negative.  The findings are nondiagnostic but suggestive of a lung adenocarcinoma.  Dr. Lamonte Sakai was notified on 10/13/2021.   SEP-OCT 2022-  [Dermatology]-   Poorly differentiated squamous cell carcinoma; -  Carcinoma extends to the edges of the excision specimen   IMPRESSION: 1. The spiculated nodule at the right lung apex on recent neck CT is hypermetabolic and is concerning for primary bronchogenic  carcinoma. Tissue sampling recommended. 2. Hypermetabolic activity within the occipital scalp and small previously demonstrated right occipital lymph node compatible with known squamous cell carcinoma. Evaluation of the head and neck limited by motion artifact. 3. Hypermetabolic lesions inferiorly in the right hepatic lobe and in the proximal right femoral diaphysis suspicious for metastatic disease, primary uncertain in this patient with a history of prostate cancer. Correlate with PSA levels. Abdominal MRI without and with contrast may be helpful for further characterization of the liver lesion.  # s/p RT tto RUL [[place]]; right hip RT;   # 2007MI-CAD [s/p stent-Dr.Berry; EF 2021- 58%]     Primary adenocarcinoma of upper lobe of right lung ([place])  10/18/2021 Initial Diagnosis   Primary adenocarcinoma of upper lobe of right lung ([place])   10/18/2021 Cancer Staging   Staging form: Lung, AJCC 8th Edition - Clinical: Stage IVB (cT1c, cN0, cM1c) - Signed by Curt Bears, MD on 10/18/2021   11/01/2021 - 05/30/2022 Chemotherapy   Patient is on Treatment Plan : LUNG NSCLC Pemetrexed + Carboplatin q21d x 4 Cycles     11/01/2021 -  Chemotherapy   Patient is on Treatment Plan : LUNG NSCLC Libtayo q  21d       HISTORY OF PRESENTING ILLNESS: Ambulating independently.  Accompanied by his wife.   Chris [name] 61 y.o.  male metastatic stage IV non-small cell lung cancer/favor adenocarcinoma-currently on maintained libatyo here for follow-up.  Patient complains that right hip pain is getting worse. rating pain 8/10. he is only taking ibuprofen as needed for pain. he would like something a little stronger.  wife is concerned pt may have a pathologic fracture of hip.  Patient currently is  limping.  No fall. Denies any fever chills.  No nausea no vomiting.   Review of Systems  Constitutional:  Positive for malaise/fatigue. Negative for chills, diaphoresis, fever and weight loss.  HENT:  Positive  for hearing loss. Negative for nosebleeds and sore throat.   Eyes:  Negative for double vision.  Respiratory:  Negative for cough, hemoptysis, sputum production, shortness of breath and wheezing.   Cardiovascular:  Negative for chest pain, palpitations, orthopnea and leg swelling.  Gastrointestinal:  Negative for abdominal pain, blood in stool, diarrhea, heartburn, melena, nausea and vomiting.  Genitourinary:  Negative for dysuria, frequency and urgency.  Musculoskeletal:  Negative for back pain.  Skin: Negative.  Negative for itching and rash.  Neurological:  Negative for dizziness, tingling, focal weakness, weakness and headaches.  Endo/Heme/Allergies:  Does not bruise/bleed easily.  Psychiatric/Behavioral:  Negative for depression. The patient does not have insomnia.      MEDICAL HISTORY:  Past Medical History:  Diagnosis Date   Anxiety    associated with medical care,  worsened by difficulty hearing, does better with wife present   Complication of anesthesia    wife states very anxious may need pre sedation   Coronary artery disease    GERD (gastroesophageal reflux disease)    Hearing loss    mild per wife   Hearing loss    no hearing aids per wife   Hyperlipidemia    Hypertension    MI (myocardial infarction) ([place]) 04/17/2006   Acute inferolateral wall MI with cardiogenic shock and complete heart block   Primary adenocarcinoma of upper lobe of right lung ([place])    Prostate cancer ([place])    Tobacco abuse    Wears glasses    Wears partial dentures    Upper    SURGICAL HISTORY: Past Surgical History:  Procedure Laterality Date   BRONCHIAL BIOPSY  10/09/2021   Procedure: BRONCHIAL BIOPSIES;  Surgeon: Collene Gobble, MD;  Location: [facility];  Service: Pulmonary;;   BRONCHIAL BRUSHINGS  10/09/2021   Procedure: BRONCHIAL BRUSHINGS;  Surgeon: Collene Gobble, MD;  Location[facility] ENDOSCOPY;  Service: Pulmonary;;   BRONCHIAL NEEDLE ASPIRATION BIOPSY  10/09/2021   Procedure:  BRONCHIAL NEEDLE ASPIRATION BIOPSIES;  Surgeon: Collene Gobble, MD;  Location: [facility] ENDOSCOPY;  Service: Pulmonary;;   CARDIAC CATHETERIZATION  2007   left, RCA 100% occluded ruptured plaque with thrombus in the proximal segment   CYST EXCISION N/A 08/30/2021   Procedure: EXCISION OF POSTERIOR SCALP CYST;  Surgeon: Clovis Riley, MD;  Location: [facility];  Service: General;  Laterality: N/A;   CYSTOSCOPY N/A 07/17/2018   Procedure: Consuela Mimes;  Surgeon: Franchot Gallo, MD;  Location: [facility];  Service: Urology;  Laterality: N/A;  no seeds in bladder per Dr Diona Fanti   FIDUCIAL MARKER PLACEMENT  10/09/2021   Procedure: FIDUCIAL MARKER PLACEMENT;  Surgeon: Collene Gobble, MD;  Location: [facility] ENDOSCOPY;  Service: Pulmonary;;   HAND SURGERY Right    has metal plate in arm   PROSTATE BIOPSY     RADIOACTIVE SEED IMPLANT N/A 07/17/2018   Procedure: RADIOACTIVE SEED IMPLANT/BRACHYTHERAPY IMPLANT;  Surgeon: Franchot Gallo, MD;  Location: [facility];  Service: Urology;  Laterality: N/A;  77 seeds   SPACE OAR INSTILLATION N/A 07/17/2018   Procedure: SPACE OAR INSTILLATION;  Surgeon: Franchot Gallo, MD;  Location: [facility];  Service: Urology;  Laterality: N/A;   VIDEO BRONCHOSCOPY WITH RADIAL ENDOBRONCHIAL ULTRASOUND  10/09/2021   Procedure: VIDEO BRONCHOSCOPY  WITH RADIAL ENDOBRONCHIAL ULTRASOUND;  Surgeon: Collene Gobble, MD;  Location[facility];  Service: Pulmonary;;   WRIST SURGERY  10/04/2012    SOCIAL HISTORY: Social History   Socioeconomic History   Marital status: Married    Spouse name: Not on file   Number of children: Not on file   Years of education: Not on file   Highest education level: Not on file  Occupational History   Not on file  Tobacco Use   Smoking status: Every Day    Packs/day: 1.50    Years: 42.00    Total pack years: 63.00    Types: Cigarettes   Smokeless tobacco: Never  Vaping Use   Vaping Use:  Never used  Substance and Sexual Activity   Alcohol use: No   Drug use: No   Sexual activity: Yes    Birth control/protection: None  Other Topics Concern   Not on file  Social History Narrative   10-04 19 Unable to ask abuse questions wife with him today.   Social Determinants of Health   Financial Resource Strain: Not on file  Food Insecurity: Not on file  Transportation Needs: Not on file  Physical Activity: Not on file  Stress: Not on file  Social Connections: Not on file  Intimate Partner Violence: Not on file    FAMILY HISTORY: Family History  Problem Relation Age of Onset   Breast cancer Mother    Prostate cancer Neg Hx    Kidney cancer Neg Hx    Cancer Neg Hx     ALLERGIES:  has No Known Allergies.  MEDICATIONS:  Current Outpatient Medications  Medication Sig Dispense Refill   ALPRAZolam (XANAX) 0.25 MG tablet Take 1 tablet (0.25 mg total) by mouth at bedtime. 15 tablet 0   atorvastatin (LIPITOR) 20 MG tablet Take 1 tablet (20 mg total) by mouth daily. 90 tablet 3   clopidogrel (PLAVIX) 75 MG tablet Take 1 tablet (75 mg total) by mouth daily. 90 tablet 3   ergocalciferol (VITAMIN D2) 1.25 MG (50000 UT) capsule Take 1 capsule (50,000 Units total) by mouth once a week. 12 capsule 1   ketoconazole (NIZORAL) 2 % cream Apply 1 application. topically 2 (two) times daily as needed for irritation.     metoprolol tartrate (LOPRESSOR) 25 MG tablet Take 1 tablet (25 mg total) by mouth daily. 90 tablet 3   pantoprazole (PROTONIX) 40 MG tablet Take 1 tablet (40 mg total) by mouth daily. 60 tablet 1   prochlorperazine (COMPAZINE) 10 MG tablet Take 1 tablet (10 mg total) by mouth every 6 (six) hours as needed for nausea or vomiting. 30 tablet 1   sertraline (ZOLOFT) 100 MG tablet TAKE 1 TABLET BY MOUTH EVERY DAY 90 tablet 0   tamsulosin (FLOMAX) 0.4 MG CAPS capsule Take 1 capsule (0.4 mg total) by mouth daily. 30 capsule 3   meloxicam (MOBIC) 15 MG tablet Take 1 tablet (15 mg  total) by mouth daily. 30 tablet 2   traMADol (ULTRAM) 50 MG tablet Take 1 tablet (50 mg total) by mouth every 6 (six) hours as needed. 30 tablet 0   No current facility-administered medications for this visit.   Facility-Administered Medications Ordered in Other Visits  Medication Dose Route Frequency Provider Last Rate Last Admin   cemiplimab-rwlc (LIBTAYO) 350 mg in sodium chloride 0.9 % 100 mL chemo infusion  350 mg Intravenous Once Charlaine Dalton R, MD       heparin lock flush 100 unit/mL  500 Units Intracatheter Once PRN Cammie Sickle, MD          .  PHYSICAL EXAMINATION: ECOG PERFORMANCE STATUS: 1 - Symptomatic but completely ambulatory  Vitals:   07/11/22 0842  BP: 133/78  Pulse: (!) 58  Resp: 20  Temp: (!) 97.5 F (36.4 C)   Filed Weights   07/11/22 0842  Weight: 242 lb 12.8 oz (110.1 kg)    Physical Exam Vitals and nursing note reviewed.  HENT:     Head: Normocephalic and atraumatic.     Mouth/Throat:     Pharynx: Oropharynx is clear.  Eyes:     Extraocular Movements: Extraocular movements intact.     Pupils: Pupils are equal, round, and reactive to light.  Cardiovascular:     Rate and Rhythm: Normal rate and regular rhythm.  Pulmonary:     Comments: Decreased breath sounds bilaterally.  Abdominal:     Palpations: Abdomen is soft.  Musculoskeletal:        General: Normal range of motion.     Cervical back: Normal range of motion.  Skin:    General: Skin is warm.  Neurological:     General: No focal deficit present.     Mental Status: He is alert and oriented to person, place, and time.  Psychiatric:        Behavior: Behavior normal.        Judgment: Judgment normal.      LABORATORY DATA:  I have reviewed the data as listed Lab Results  Component Value Date   WBC 6.0 07/11/2022   HGB 15.5 07/11/2022   HCT 45.3 07/11/2022   MCV 91.3 07/11/2022   PLT 209 07/11/2022   Recent Labs    05/30/22 0953 06/20/22 0912 07/11/22 0812   NA 138 139 138  K 4.0 4.5 3.7  CL 106 105 110  CO2 26 26 22   GLUCOSE 104* 113* 138*  BUN 17 17 19   CREATININE 1.04 1.21 0.96  CALCIUM 9.0 8.9 8.8*  GFRNONAA >60 >60 >60  PROT 7.4 7.5 7.2  ALBUMIN 4.3 4.1 4.1  AST 19 19 20   ALT 23 23 19   ALKPHOS 67 70 65  BILITOT 0.5 0.5 0.7    RADIOGRAPHIC STUDIES: I have personally reviewed the radiological images as listed and agreed with the findings in the report. No results found.  ASSESSMENT & PLAN:   Primary adenocarcinoma of upper lobe of right lung ([place]) # Non-small cell lung cancer/stage IV-favor adeno carcinoma-metastases to right femur/liver; synchronous squamous cell carcinoma scalp. S/p  carbo-Al[place]-Libtayo [cycle #4]; urrently on maintanence Libatayo. JULY 30th, 2023-  Decreased size of the spiculated pulmonary nodule in the right lung; right lung Radiation changes; slight decrease in the liver lesion.  No new disease noted.  # Proceed with Single agent Libtayo maintenance- Labs today reviewed;  acceptable for treatment today.    # right hip pain-status post radiation [[place]; DEC 2022]-July 2023- Bone scan- subtle uptake Okay to proceed with NSAIDs as needed. Check X-ray; PET scan ASAP-STABLE.  Will refer to Dr. Donella Stade.  # Low vit D- Carlean Purl 2023- vit D- 25]; on  ergocalciferol- STABLE.      # Squamous cell carcinoma of the scalp-s/p excision; positive margins- on libtayo-no clinical evidence of progression.  Might need to consider radiation-if any local progression noted/also based on course lung cancer.  STABLE.    # GERD- on prilosec recommend PPI BID prior to meals- STABLE.    # IV access: PIV  #  DISPOSITION: # Add TSH # refer to Dr.Chrystal re: hip pain/ Hx of lung cancer # Hip X-rays today # PET scan ASAP #Libtayo;- treatment  Today.  # follow up in 3 weeks- MD;labs- cbc/cmp; Libtayo;- Dr.B             All questions were answered. The patient knows to call the clinic with any problems, questions or  concerns.       Cammie Sickle, MD 07/11/2022 10:44 AM

## 2022-07-11 NOTE — Telephone Encounter (Signed)
Spoke with patient's wife. Informed wife that pt's hip xray did not demonstrate any signs of fractures. We will wait on results for pet scan. Pt has also been referred to radiation oncology.  Pt/wife gave verbal understanding of the results and plan. Wife thanked me for calling her with the test results.

## 2022-07-11 NOTE — Assessment & Plan Note (Addendum)
#   Non-small cell lung cancer/stage IV-favor adeno carcinoma-metastases to right femur/liver; synchronous squamous cell carcinoma scalp. S/p  carbo-Alimta-Libtayo [cycle #4]; urrently on maintanence Libatayo. JULY 30th, 2023-  Decreased size of the spiculated pulmonary nodule in the right lung; right lung Radiation changes; slight decrease in the liver lesion.  No new disease noted.  # Proceed with Single agent Libtayo maintenance- Labs today reviewed;  acceptable for treatment today.    # right hip pain-status post radiation [[place]; DEC 2022]-July 2023- Bone scan- subtle uptake Okay to proceed with NSAIDs as needed. Check X-ray; PET scan ASAP-STABLE.  Will refer to Dr. Donella Stade.  # Low vit D- Carlean Purl 2023- vit D- 25]; on  ergocalciferol- STABLE.      # Squamous cell carcinoma of the scalp-s/p excision; positive margins- on libtayo-no clinical evidence of progression.  Might need to consider radiation-if any local progression noted/also based on course lung cancer.  STABLE.    # GERD- on prilosec recommend PPI BID prior to meals- STABLE.    # IV access: PIV  # DISPOSITION: # Add TSH # refer to Dr.Chrystal re: hip pain/ Hx of lung cancer # Hip X-rays today # PET scan ASAP #Libtayo;- treatment  Today.  # follow up in 3 weeks- MD;labs- cbc/cmp; Libtayo;- Dr.B

## 2022-07-11 NOTE — Progress Notes (Addendum)
[place] at [facility] [phone] [phone]   Name: Chris [name] Date: 07/11/2022 MRN: [id number]  DOB: May 28, 1961  Patient Care Team: Casilda Carls, MD as PCP - General (Internal Medicine) Lorretta Harp, MD as PCP - Cardiology (Cardiology) Curt Bears, MD as Consulting Physician (Oncology) Casilda Carls, MD as Referring Physician (Internal Medicine) Cammie Sickle, MD as Consulting Physician (Internal Medicine) Cammie Sickle, MD as Consulting Physician (Internal Medicine)    REASON FOR CONSULTATION: Chris [name] is a 61 y.o. male with multiple medical problems including stage IV non-small cell lung cancer with metastasis to femur/liver diagnosed in December 2022.  Patient is currently on treatment with carbo/Alimta/Libtayo but has had poor tolerance to treatment.  He is referred to radiation oncology for SBRT of right upper lobe lung lesion and right proximal femoral lesion.  Patient has had severe anxiety and was referred to palliative care to help address goals and manage ongoing symptoms.  SOCIAL HISTORY:     reports that he has been smoking cigarettes. He has a 63.00 pack-year smoking history. He has never used smokeless tobacco. He reports that he does not drink alcohol and does not use drugs.  Patient is married and lives at home with his wife.  They have a son and recently had grandchildren.  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  ADVANCE DIRECTIVES:  Not on file  CODE STATUS: Limited Code (CPR but no intubation, MOST form signed on 04/04/22)  PAST MEDICAL HISTORY: Past Medical History:  Diagnosis Date   Anxiety    associated with medical care,  worsened by difficulty hearing, does better with wife present   Complication of anesthesia    wife states very anxious may need pre sedation   Coronary artery disease    GERD (gastroesophageal reflux disease)    Hearing loss     mild per wife   Hearing loss    no hearing aids per wife   Hyperlipidemia    Hypertension    MI (myocardial infarction) ([place]) 04/17/2006   Acute inferolateral wall MI with cardiogenic shock and complete heart block   Primary adenocarcinoma of upper lobe of right lung ([place])    Prostate cancer ([place])    Tobacco abuse    Wears glasses    Wears partial dentures    Upper    PAST SURGICAL HISTORY:  Past Surgical History:  Procedure Laterality Date   BRONCHIAL BIOPSY  10/09/2021   Procedure: BRONCHIAL BIOPSIES;  Surgeon: Collene Gobble, MD;  Location: [place];  Service: Pulmonary;;   BRONCHIAL BRUSHINGS  10/09/2021   Procedure: BRONCHIAL BRUSHINGS;  Surgeon: Collene Gobble, MD;  Location: [facility];  Service: Pulmonary;;   BRONCHIAL NEEDLE ASPIRATION BIOPSY  10/09/2021   Procedure: BRONCHIAL NEEDLE ASPIRATION BIOPSIES;  Surgeon: Collene Gobble, MD;  Location: [place] [facility]COPY;  Service: Pulmonary;;   CARDIAC CATHETERIZATION  2007   left, RCA 100% occluded ruptured plaque with thrombus in the proximal segment   CYST EXCISION N/A 08/30/2021   Procedure: EXCISION OF POSTERIOR SCALP CYST;  Surgeon: Clovis Riley, MD;  Location: [facility];  Service: General;  Laterality: N/A;   CYSTOSCOPY N/A 07/17/2018   Procedure: Consuela Mimes;  Surgeon: Franchot Gallo, MD;  Location: [facility];  Service: Urology;  Laterality: N/A;  no seeds in bladder per Dr Diona Fanti   FIDUCIAL MARKER PLACEMENT  10/09/2021   Procedure: FIDUCIAL MARKER PLACEMENT;  Surgeon: Collene Gobble, MD;  Location:  [place] [facility];  Service: Pulmonary;;   HAND SURGERY Right    has metal plate in arm   PROSTATE BIOPSY     RADIOACTIVE SEED IMPLANT N/A 07/17/2018   Procedure: RADIOACTIVE SEED IMPLANT/BRACHYTHERAPY IMPLANT;  Surgeon: Franchot Gallo, MD;  Location: [facility];  Service: Urology;  Laterality: N/A;  77 seeds   SPACE OAR INSTILLATION N/A 07/17/2018   Procedure: SPACE OAR  INSTILLATION;  Surgeon: Franchot Gallo, MD;  Location: [facility];  Service: Urology;  Laterality: N/A;   VIDEO BRONCHOSCOPY WITH RADIAL ENDOBRONCHIAL ULTRASOUND  10/09/2021   Procedure: VIDEO BRONCHOSCOPY WITH RADIAL ENDOBRONCHIAL ULTRASOUND;  Surgeon: Collene Gobble, MD;  Location: [place] [facility]COPY;  Service: Pulmonary;;   WRIST SURGERY  10/04/2012    HEMATOLOGY/ONCOLOGY HISTORY:  Oncology History Overview Note  DIAGNOSIS: 1) stage IV (T1c, N0, M1 C) non-small cell lung cancer favoring adenocarcinoma presented with right lung apical nodule in addition to metastatic disease in the right hepatic lobe and the proximal right femoral diaphysis diagnosed and December 2022. 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    QNS; Guardant 360 showed positive KRAS G12C mutation  FINAL MICROSCOPIC DIAGNOSIS:   A. LUNG, RUL, FINE NEEDLE ASPIRATION:  - Malignant cells consistent with non-small cell carcinoma, see comment   B. LUNG, RUL, BRUSHING:  - Malignant cells consistent with non-small cell carcinoma, see comment       COMMENT:   A and B.  Dr. Saralyn Pilar reviewed the case and concurs with the diagnosis.  Only rare malignant cells are present on the cellblock.  Immunohistochemical stains were attempted and show that the tumor cells  have patchy staining for TTF-1 whereas p63, p40 and CK5/6 are negative.  The findings are nondiagnostic but suggestive of a lung adenocarcinoma.  Dr. Lamonte Sakai was notified on 10/13/2021.   SEP-OCT 2022-  [Dermatology]-   Poorly differentiated squamous cell carcinoma; -  Carcinoma extends to the edges of the excision specimen   IMPRESSION: 1. The spiculated nodule at the right lung apex on recent neck CT is hypermetabolic and is concerning for primary bronchogenic carcinoma. Tissue sampling recommended. 2. Hypermetabolic activity within the occipital scalp and  small previously demonstrated right occipital lymph node compatible with known squamous cell carcinoma. Evaluation of the head and neck limited by motion artifact. 3. Hypermetabolic lesions inferiorly in the right hepatic lobe and in the proximal right femoral diaphysis suspicious for metastatic disease, primary uncertain in this patient with a history of prostate cancer. Correlate with PSA levels. Abdominal MRI without and with contrast may be helpful for further characterization of the liver lesion.  # s/p RT tto RUL [[place]]; right hip RT;   # 2007MI-CAD [s/p stent-Dr.Berry; EF 2021- 58%]     Primary adenocarcinoma of upper lobe of right lung ([place])  10/18/2021 Initial Diagnosis   Primary adenocarcinoma of upper lobe of right lung ([place])   10/18/2021 Cancer Staging   Staging form: Lung, AJCC 8th Edition - Clinical: Stage IVB (cT1c, cN0, cM1c) - Signed by Curt Bears, MD on 10/18/2021   11/01/2021 - 05/30/2022 Chemotherapy   Patient is on Treatment Plan : LUNG NSCLC Pemetrexed + Carboplatin q21d x 4 Cycles     11/01/2021 -  Chemotherapy   Patient is on Treatment Plan : LUNG NSCLC Pemetrexed + Carboplatin q21d x 4 Cycles       ALLERGIES:  has No Known Allergies.  MEDICATIONS:  Current Outpatient Medications  Medication  Sig Dispense Refill   ALPRAZolam (XANAX) 0.25 MG tablet Take 1 tablet (0.25 mg total) by mouth at bedtime. 15 tablet 0   atorvastatin (LIPITOR) 20 MG tablet Take 1 tablet (20 mg total) by mouth daily. 90 tablet 3   clopidogrel (PLAVIX) 75 MG tablet Take 1 tablet (75 mg total) by mouth daily. 90 tablet 3   ergocalciferol (VITAMIN D2) 1.25 MG (50000 UT) capsule Take 1 capsule (50,000 Units total) by mouth once a week. 12 capsule 1   ketoconazole (NIZORAL) 2 % cream Apply 1 application. topically 2 (two) times daily as needed for irritation.     metoprolol tartrate (LOPRESSOR) 25 MG tablet Take 1 tablet (25 mg total) by mouth daily. 90 tablet 3   pantoprazole  (PROTONIX) 40 MG tablet Take 1 tablet (40 mg total) by mouth daily. 60 tablet 1   prochlorperazine (COMPAZINE) 10 MG tablet Take 1 tablet (10 mg total) by mouth every 6 (six) hours as needed for nausea or vomiting. 30 tablet 1   sertraline (ZOLOFT) 100 MG tablet TAKE 1 TABLET BY MOUTH EVERY DAY 90 tablet 0   tamsulosin (FLOMAX) 0.4 MG CAPS capsule Take 1 capsule (0.4 mg total) by mouth daily. 30 capsule 3   No current facility-administered medications for this visit.    VITAL SIGNS: There were no vitals taken for this visit. There were no vitals filed for this visit.  Estimated body mass index is 34.84 kg/m as calculated from the following:   Height as of an earlier encounter on 07/11/22: 5' 10" (1.778 m).   Weight as of an earlier encounter on 07/11/22: 242 lb 12.8 oz (110.1 kg).  LABS: CBC:    Component Value Date/Time   WBC 6.0 07/11/2022 0812   HGB 15.5 07/11/2022 0812   HGB 16.4 10/31/2021 1107   HCT 45.3 07/11/2022 0812   PLT 209 07/11/2022 0812   PLT 217 10/31/2021 1107   MCV 91.3 07/11/2022 0812   NEUTROABS 4.2 07/11/2022 0812   LYMPHSABS 1.0 07/11/2022 0812   MONOABS 0.7 07/11/2022 0812   EOSABS 0.1 07/11/2022 0812   BASOSABS 0.0 07/11/2022 0812   Comprehensive Metabolic Panel:    Component Value Date/Time   NA 138 07/11/2022 0812   K 3.7 07/11/2022 0812   CL 110 07/11/2022 0812   CO2 22 07/11/2022 0812   BUN 19 07/11/2022 0812   CREATININE 0.96 07/11/2022 0812   CREATININE 1.08 10/31/2021 1107   GLUCOSE 138 (H) 07/11/2022 0812   CALCIUM 8.8 (L) 07/11/2022 0812   AST 20 07/11/2022 0812   AST 15 10/31/2021 1107   ALT 19 07/11/2022 0812   ALT 17 10/31/2021 1107   ALKPHOS 65 07/11/2022 0812   BILITOT 0.7 07/11/2022 0812   BILITOT 0.7 10/31/2021 1107   PROT 7.2 07/11/2022 0812   ALBUMIN 4.1 07/11/2022 0812    RADIOGRAPHIC STUDIES: No results found.  PERFORMANCE STATUS (ECOG) : 1 - Symptomatic but completely ambulatory  Review of Systems Unless  otherwise noted, a complete review of systems is negative.  Physical Exam General: NAD Pulmonary: Unlabored Extremities: no edema, no joint deformities Skin: Scaly, erythematous rash beneath pannus Neurological: Weakness but otherwise nonfocal  IMPRESSION: Patient with an abnormal clinic schedule today at patient's request.  Patient endorses worsening right hip pain with ambulation and position changes.  Pain has been worsening over the past month or so.  No falls or traumatic injury.  Patient is known to have metastatic disease to the right hip.  We will  obtain x-ray of hip for evaluation and rule out pathologic fracture.  Patient has been taking ibuprofen 400 mg 2 to 3 tablets several times a day.  Ibuprofen is helping but short-lived.  Discussed pain regimen in detail.  Patient would like to avoid opioids during the daytime hours as he is still actively driving for work.  We will rotate from ibuprofen to meloxicam.  We will start tramadol as needed and evening hours.  PLAN: -Continue current scope of treatment -X-ray right hip -Start meloxicam 15 mg daily. On PPI daily.  -Tramadol 50 mg every 6 hours as needed (avoid taking during daytime hours if working) -Continue Zoloft/alprazolam -Follow-up telephone visit 1 to 2 weeks   Patient expressed understanding and was in agreement with this plan. He also understands that He can call the clinic at any time with any questions, concerns, or complaints.     Time Total: 30 minutes  Visit consisted of counseling and education dealing with the complex and emotionally intense issues of symptom management and palliative care in the setting of serious and potentially life-threatening illness.Greater than 50%  of this time was spent counseling and coordinating care related to the above assessment and plan.  Signed by: Altha Harm, PhD, NP-C

## 2022-07-11 NOTE — Patient Instructions (Signed)
[facility] CANCER CTR AT [place]  Discharge Instructions: Thank you for choosing [facility] to provide your oncology and hematology care.  If you have a lab appointment with the [facility], please go directly to the [facility] and check in at the registration area.  Wear comfortable clothing and clothing appropriate for easy access to any Portacath or PICC line.   We strive to give you quality time with your provider. You may need to reschedule your appointment if you arrive late (15 or more minutes).  Arriving late affects you and other patients whose appointments are after yours.  Also, if you miss three or more appointments without notifying the office, you may be dismissed from the clinic at the provider's discretion.      For prescription refill requests, have your pharmacy contact our office and allow 72 hours for refills to be completed.    Today you received the following chemotherapy and/or immunotherapy agents [id number]      To help prevent nausea and vomiting after your treatment, we encourage you to take your nausea medication as directed.  BELOW ARE SYMPTOMS THAT SHOULD BE REPORTED IMMEDIATELY: *FEVER GREATER THAN 100.4 F (38 C) OR HIGHER *CHILLS OR SWEATING *NAUSEA AND VOMITING THAT IS NOT CONTROLLED WITH YOUR NAUSEA MEDICATION *UNUSUAL SHORTNESS OF BREATH *UNUSUAL BRUISING OR BLEEDING *URINARY PROBLEMS (pain or burning when urinating, or frequent urination) *BOWEL PROBLEMS (unusual diarrhea, constipation, pain near the anus) TENDERNESS IN MOUTH AND THROAT WITH OR WITHOUT PRESENCE OF ULCERS (sore throat, sores in mouth, or a toothache) UNUSUAL RASH, SWELLING OR PAIN  UNUSUAL VAGINAL DISCHARGE OR ITCHING   Items with * indicate a potential emergency and should be followed up as soon as possible or go to the Emergency Department if any problems should occur.  Please show the CHEMOTHERAPY ALERT CARD or IMMUNOTHERAPY ALERT CARD at  check-in to the Emergency Department and triage nurse.  Should you have questions after your visit or need to cancel or reschedule your appointment, please contact [facility] CANCER [place] AT [place]  [phone] and follow the prompts.  Office hours are 8:00 a.m. to 4:30 p.m. Monday - Friday. Please note that voicemails left after 4:00 p.m. may not be returned until the following business day.  We are closed weekends and major holidays. You have access to a nurse at all times for urgent questions. Please call the main number to the clinic [phone] and follow the prompts.  For any non-urgent questions, you may also contact your provider using MyChart. We now offer e-Visits for anyone 66 and older to request care online for non-urgent symptoms. For details visit [email or web address].   Also download the MyChart app! Go to the app store, search "MyChart", open the app, select [facility], and log in with your MyChart username and password.  Masks are optional in the cancer centers. If you would like for your care team to wear a mask while they are taking care of you, please let them know. For doctor visits, patients may have with them one support person who is at least 61 years old. At this time, visitors are not allowed in the infusion area.  Cemiplimab Injection What is this medication? CEMIPLIMAB (se MIP li mab) treats skin cancer and lung cancer. It works by helping your immune system slow or stop the spread of cancer cells. It is a monoclonal antibody. This medicine may be used for other purposes; ask your health care provider or pharmacist if you have questions.  COMMON BRAND NAME(S): LIBTAYO What should I tell my care team before I take this medication? They need to know if you have any of these conditions: Allogeneic stem cell transplant (uses someone else's stem cells) Autoimmune diseases, such as Crohn's disease, ulcerative colitis, or lupus Diabetes Nervous system  problems, such as myasthenia gravis or Guillain-Barre syndrome Organ transplant Recent or ongoing radiation Thyroid disease An unusual or allergic reaction to cemiplimab, other medications, foods, dyes, or preservatives Pregnant or trying to get pregnant Breast-feeding How should I use this medication? This medication is infused into a vein. It is given by your care team in a hospital or clinic setting. A special MedGuide will be given to you before each treatment. Be sure to read this information carefully each time. Talk to your care team about the use of this medication in children. Special care may be needed. Overdosage: If you think you have taken too much of this medicine contact a poison control center or emergency room at once. NOTE: This medicine is only for you. Do not share this medicine with others. What if I miss a dose? Keep appointments for follow-up doses. It is important not to miss your dose. Call your care team if you are unable to keep an appointment. What may interact with this medication? Interactions have not been studied. This list may not describe all possible interactions. Give your health care provider a list of all the medicines, herbs, non-prescription drugs, or dietary supplements you use. Also tell them if you smoke, drink alcohol, or use illegal drugs. Some items may interact with your medicine. What should I watch for while using this medication? This medication may make you feel generally unwell. This is not uncommon as chemotherapy can affect healthy cells as well as cancer cells. Report any side effects. Continue your course of treatment even though you feel ill until your care team tells you to stop. You may need blood work done while you are taking this medication. This medication may cause serious skin reactions. They can happen in the weeks to months after starting the medication. Contact your care team right away if you notice fevers or flu-like symptoms  with a rash. The rash may be red or purple and then turn into blisters or peeling of the skin. You may also notice a red rash with swelling of the face, lips, or lymph nodes in your neck or under your arms. Tell your care team right away if you have any changes in your vision. This medication may increase blood sugar. The risk may be higher in patients who already have diabetes. Ask your care team what you can do to lower your risk of diabetes while taking this medication. Talk to your care team if you wish to become pregnant or think you might be pregnant. This medication can cause serious birth defects if taken during pregnancy. A negative pregnancy test is required before starting this medication. A reliable form of contraception is recommended while taking this medication and for at least 4 months after stopping it. Do not breast-feed while taking this medication and for at least 4 months after stopping it. What side effects may I notice from receiving this medication? Side effects that you should report to your care team as soon as possible: Allergic reactions--skin rash, itching, hives, swelling of the face, lips, tongue, or throat Dry cough, shortness of breath or trouble breathing Eye pain, redness, irritation, or discharge with blurry or decreased vision Heart muscle inflammation--unusual weakness or  fatigue, shortness of breath, chest pain, fast or irregular heartbeat, dizziness, swelling of the ankles, feet, or hands Hormone gland problems--headache, sensitivity to light, unusual weakness or fatigue, dizziness, fast or irregular heartbeat, increased sensitivity to cold or heat, excessive sweating, constipation, hair loss, increased thirst or amount of urine, tremors or shaking, irritability Infusion reactions--chest pain, shortness of breath or trouble breathing, feeling faint or lightheaded Kidney injury (glomerulonephritis)--decrease in the amount of urine, red or dark brown urine, foamy or  bubbly urine, swelling of the ankles, hands, or feet Liver injury--right upper belly pain, loss of appetite, nausea, light-colored stool, dark yellow or brown urine, yellowing skin or eyes, unusual weakness or fatigue Pain, tingling, or numbness in the hands or feet, muscle weakness, change in vision, confusion or trouble speaking, loss of balance or coordination, trouble walking, seizures Rash, fever, and swollen lymph nodes Redness, blistering, peeling, or loosening of the skin, including inside the mouth Sudden or severe stomach pain, bloody diarrhea, fever, nausea, vomiting Side effects that usually do not require medical attention (report these to your care team if they continue or are bothersome): Bone, joint, or muscle pain Diarrhea Fatigue Loss of appetite Nausea Skin rash This list may not describe all possible side effects. Call your doctor for medical advice about side effects. You may report side effects to FDA at 1-800-FDA-1088. Where should I keep my medication? This medication is given in a hospital or clinic and will not be stored at home. NOTE: This sheet is a summary. It may not cover all possible information. If you have questions about this medicine, talk to your doctor, pharmacist, or health care provider.  2023 Elsevier/Gold Standard (2021-09-20 00:00:00)

## 2022-07-17 ENCOUNTER — Ambulatory Visit
Admission: RE | Admit: 2022-07-17 | Discharge: 2022-07-17 | Disposition: A | Discharge status: Home / Self Care | Payer: BC Managed Care – PPO | Source: Ambulatory Visit | Attending: Radiation Oncology | Admitting: Radiation Oncology

## 2022-07-17 VITALS — BP 137/80 | HR 67 | Temp 95.8°F | Resp 17 | Ht 70.0 in | Wt 243.0 lb

## 2022-07-17 DIAGNOSIS — I10 Essential (primary) hypertension: Secondary | ICD-10-CM | POA: Diagnosis not present

## 2022-07-17 DIAGNOSIS — Z7902 Long term (current) use of antithrombotics/antiplatelets: Secondary | ICD-10-CM | POA: Insufficient documentation

## 2022-07-17 DIAGNOSIS — Z79899 Other long term (current) drug therapy: Secondary | ICD-10-CM | POA: Diagnosis not present

## 2022-07-17 DIAGNOSIS — E785 Hyperlipidemia, unspecified: Secondary | ICD-10-CM | POA: Insufficient documentation

## 2022-07-17 DIAGNOSIS — Z923 Personal history of irradiation: Secondary | ICD-10-CM | POA: Insufficient documentation

## 2022-07-17 DIAGNOSIS — F419 Anxiety disorder, unspecified: Secondary | ICD-10-CM | POA: Insufficient documentation

## 2022-07-17 DIAGNOSIS — I2510 Atherosclerotic heart disease of native coronary artery without angina pectoris: Secondary | ICD-10-CM | POA: Insufficient documentation

## 2022-07-17 DIAGNOSIS — Z191 Hormone sensitive malignancy status: Secondary | ICD-10-CM | POA: Diagnosis not present

## 2022-07-17 DIAGNOSIS — I252 Old myocardial infarction: Secondary | ICD-10-CM | POA: Insufficient documentation

## 2022-07-17 DIAGNOSIS — K219 Gastro-esophageal reflux disease without esophagitis: Secondary | ICD-10-CM | POA: Diagnosis not present

## 2022-07-17 DIAGNOSIS — C3411 Malignant neoplasm of upper lobe, right bronchus or lung: Secondary | ICD-10-CM | POA: Insufficient documentation

## 2022-07-17 DIAGNOSIS — C7951 Secondary malignant neoplasm of bone: Secondary | ICD-10-CM | POA: Insufficient documentation

## 2022-07-17 DIAGNOSIS — C61 Malignant neoplasm of prostate: Secondary | ICD-10-CM | POA: Diagnosis not present

## 2022-07-17 DIAGNOSIS — Z791 Long term (current) use of non-steroidal anti-inflammatories (NSAID): Secondary | ICD-10-CM | POA: Diagnosis not present

## 2022-07-17 DIAGNOSIS — Z8589 Personal history of malignant neoplasm of other organs and systems: Secondary | ICD-10-CM

## 2022-07-17 NOTE — Consult Note (Signed)
NEW PATIENT EVALUATION  Name: Chris [name]  MRN: [id number]  Date:   07/17/2022     DOB: 01/28/1961   This 61 y.o. 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   PREVIOUS INVESTIGATIONS:  Previous PET CT scan reviewed new PET CT scan this Friday Clinical notes reviewed Pathology reports reviewed  HPI: Patient is a 61 year old male who received his radiation treatments in [place].  He has been previously treated for prostate cancer.  Also received radiation to his right upper lobe as well as his right femur for metastatic disease based on PET/CT findings.  He received IMRT treatment to his right femur.  Pain has persisted.  He is also had metastatic disease on his scalp which has been resected.  He is now ambulating with assistance of a cane states the pain is worsening and he is narcotic dependent at this time.  He is currently on libatyo which she is tolerating well I have been asked to evaluate him.  He has a PET CT scan scheduled for this Friday.  PLANNED TREATMENT REGIMEN: Possible palliative treatment to his right hip.  PAST MEDICAL HISTORY:  has a past medical history of Anxiety, Complication of anesthesia, Coronary artery disease, GERD (gastroesophageal reflux disease), Hearing loss, Hearing loss, Hyperlipidemia, Hypertension, MI (myocardial infarction) ([place]) (04/17/2006), Primary adenocarcinoma of upper lobe of right lung ([place]), Prostate cancer ([place]), Tobacco abuse, Wears glasses, and Wears partial dentures.    PAST SURGICAL HISTORY:  Past Surgical History:  Procedure  Laterality Date   BRONCHIAL BIOPSY  10/09/2021   Procedure: BRONCHIAL BIOPSIES;  Surgeon: Collene Gobble, MD;  Location: [facility] ENDOSCOPY;  Service: Pulmonary;;   BRONCHIAL BRUSHINGS  10/09/2021   Procedure: BRONCHIAL BRUSHINGS;  Surgeon: Collene Gobble, MD;  Location: [facility] ENDOSCOPY;  Service: Pulmonary;;   BRONCHIAL NEEDLE ASPIRATION BIOPSY  10/09/2021   Procedure: BRONCHIAL NEEDLE ASPIRATION BIOPSIES;  Surgeon: Collene Gobble, MD;  Location: [facility] ENDOSCOPY;  Service: Pulmonary;;   CARDIAC CATHETERIZATION  2007   left, RCA 100% occluded ruptured plaque with thrombus in the proximal segment   CYST EXCISION N/A 08/30/2021   Procedure: EXCISION OF POSTERIOR SCALP CYST;  Surgeon: Clovis Riley, MD;  Location: [facility] ORS;  Service: General;  Laterality: N/A;   CYSTOSCOPY N/A 07/17/2018   Procedure: Consuela Mimes;  Surgeon: Franchot Gallo, MD;  Location: [facility];  Service: Urology;  Laterality: N/A;  no seeds in bladder per Dr Diona Fanti   FIDUCIAL MARKER PLACEMENT  10/09/2021   Procedure: FIDUCIAL MARKER PLACEMENT;  Surgeon: Collene Gobble, MD;  Location: [facility] ENDOSCOPY;  Service: Pulmonary;;   HAND SURGERY Right    has metal plate in arm   PROSTATE BIOPSY     RADIOACTIVE SEED IMPLANT N/A 07/17/2018   Procedure: RADIOACTIVE SEED IMPLANT/BRACHYTHERAPY IMPLANT;  Surgeon: Franchot Gallo, MD;  Location: [facility];  Service: Urology;  Laterality: N/A;  77 seeds   SPACE OAR INSTILLATION N/A 07/17/2018   Procedure: SPACE OAR INSTILLATION;  Surgeon: Franchot Gallo, MD;  Location: [facility];  Service: Urology;  Laterality:  N/A;   VIDEO BRONCHOSCOPY WITH RADIAL ENDOBRONCHIAL ULTRASOUND  10/09/2021   Procedure: VIDEO BRONCHOSCOPY WITH RADIAL ENDOBRONCHIAL ULTRASOUND;  Surgeon: Collene Gobble, MD;  Location: [facility] ENDOSCOPY;  Service: Pulmonary;;   WRIST SURGERY  10/04/2012    FAMILY HISTORY: family history includes Breast cancer in his  mother.  SOCIAL HISTORY:  reports that he has been smoking cigarettes. He has a 63.00 pack-year smoking history. He has never used smokeless tobacco. He reports that he does not drink alcohol and does not use drugs.  ALLERGIES: Patient has no known allergies.  MEDICATIONS:  Current Outpatient Medications  Medication Sig Dispense Refill   ALPRAZolam (XANAX) 0.25 MG tablet Take 1 tablet (0.25 mg total) by mouth at bedtime. 15 tablet 0   atorvastatin (LIPITOR) 20 MG tablet Take 1 tablet (20 mg total) by mouth daily. 90 tablet 3   clopidogrel (PLAVIX) 75 MG tablet Take 1 tablet (75 mg total) by mouth daily. 90 tablet 3   ergocalciferol (VITAMIN D2) 1.25 MG (50000 UT) capsule Take 1 capsule (50,000 Units total) by mouth once a week. 12 capsule 1   ketoconazole (NIZORAL) 2 % cream Apply 1 application. topically 2 (two) times daily as needed for irritation.     meloxicam (MOBIC) 15 MG tablet Take 1 tablet (15 mg total) by mouth daily. 30 tablet 2   metoprolol tartrate (LOPRESSOR) 25 MG tablet Take 1 tablet (25 mg total) by mouth daily. 90 tablet 3   pantoprazole (PROTONIX) 40 MG tablet Take 1 tablet (40 mg total) by mouth daily. 60 tablet 1   prochlorperazine (COMPAZINE) 10 MG tablet Take 1 tablet (10 mg total) by mouth every 6 (six) hours as needed for nausea or vomiting. 30 tablet 1   sertraline (ZOLOFT) 100 MG tablet TAKE 1 TABLET BY MOUTH EVERY DAY 90 tablet 0   tamsulosin (FLOMAX) 0.4 MG CAPS capsule Take 1 capsule (0.4 mg total) by mouth daily. 30 capsule 3   traMADol (ULTRAM) 50 MG tablet Take 1 tablet (50 mg total) by mouth every 6 (six) hours as needed. 30 tablet 0   No current facility-administered medications for this encounter.    ECOG PERFORMANCE STATUS:  1 - Symptomatic but completely ambulatory  REVIEW OF SYSTEMS: Patient denies any weight loss, fatigue, weakness, fever, chills or night sweats. Patient denies any loss of vision, blurred vision. Patient denies any ringing  of the  ears or hearing loss. No irregular heartbeat. Patient denies heart murmur or history of fainting. Patient denies any chest pain or pain radiating to her upper extremities. Patient denies any shortness of breath, difficulty breathing at night, cough or hemoptysis. Patient denies any swelling in the lower legs. Patient denies any nausea vomiting, vomiting of blood, or coffee ground material in the vomitus. Patient denies any stomach pain. Patient states has had normal bowel movements no significant constipation or diarrhea. Patient denies any dysuria, hematuria or significant nocturia. Patient denies any problems walking, swelling in the joints or loss of balance. Patient denies any skin changes, loss of hair or loss of weight. Patient denies any excessive worrying or anxiety or significant depression. Patient denies any problems with insomnia. Patient denies excessive thirst, polyuria, polydipsia. Patient denies any swollen glands, patient denies easy bruising or easy bleeding. Patient denies any recent infections, allergies or URI. Patient "s visual fields have not changed significantly in recent time.   PHYSICAL EXAM: BP 137/80   Pulse 67   Temp (!) 95.8 F (35.4 C)   Resp 17  Ht 5\' 10"  (1.778 m)   Wt 243 lb (110.2 kg)   BMI 34.87 kg/m  Range of motion of his lower extremities does not elicit pain.  Motor and sensory levels are equal and symmetric in the lower extremities.  Deep palpation of the spine does not elicit pain.  Well-developed well-nourished patient in NAD. HEENT reveals PERLA, EOMI, discs not visualized.  Oral cavity is clear. No oral mucosal lesions are identified. Neck is clear without evidence of cervical or supraclavicular adenopathy. Lungs are clear to A&P. Cardiac examination is essentially unremarkable with regular rate and rhythm without murmur rub or thrill. Abdomen is benign with no organomegaly or masses noted. Motor sensory and DTR levels are equal and symmetric in the upper  and lower extremities. Cranial nerves II through XII are grossly intact. Proprioception is intact. No peripheral adenopathy or edema is identified. No motor or sensory levels are noted. Crude visual fields are within normal range.  LABORATORY DATA: Pathology reports reviewed    RADIOLOGY RESULTS: Previous PET scan reviewed we will review his PET scan this Friday.   IMPRESSION: Stage IV lung cancer with previous treatment to his right distal femur for possible retreatment of that area for palliation. PLAN: This time like to review his PET CT scan and make further determinations on retreatment of his right hip.  Would possibly treat with SBRT again although can only make that determination after review of his PET CT scan.  His wife and the patient realize this I have set him up for simulation early next week after his PET CT scan is reviewed.  They both comprehend my recommendations well.  I would like to take this opportunity to thank you for allowing me to participate in the care of your patient.Noreene Filbert, MD

## 2022-07-20 ENCOUNTER — Ambulatory Visit (HOSPITAL_COMMUNITY)
Admission: RE | Admit: 2022-07-20 | Discharge: 2022-07-20 | Disposition: A | Discharge status: Home / Self Care | Payer: BC Managed Care – PPO | Source: Ambulatory Visit | Attending: Internal Medicine | Admitting: Internal Medicine

## 2022-07-20 DIAGNOSIS — M25551 Pain in right hip: Secondary | ICD-10-CM | POA: Diagnosis not present

## 2022-07-20 DIAGNOSIS — C3411 Malignant neoplasm of upper lobe, right bronchus or lung: Secondary | ICD-10-CM

## 2022-07-20 DIAGNOSIS — R918 Other nonspecific abnormal finding of lung field: Secondary | ICD-10-CM | POA: Diagnosis not present

## 2022-07-20 LAB — GLUCOSE, CAPILLARY: Glucose-Capillary: 116 mg/dL — ABNORMAL HIGH (ref 70–99)

## 2022-07-20 MED ORDER — FLUDEOXYGLUCOSE F - 18 (FDG) INJECTION
12.1300 | Freq: Once | INTRAVENOUS | Status: AC | PRN
  Administered 2022-07-20: 12.13 via INTRAVENOUS

## 2022-07-23 ENCOUNTER — Inpatient Hospital Stay (HOSPITAL_BASED_OUTPATIENT_CLINIC_OR_DEPARTMENT_OTHER): Payer: BC Managed Care – PPO | Admitting: Hospice and Palliative Medicine

## 2022-07-23 ENCOUNTER — Encounter: Payer: Self-pay | Admitting: Internal Medicine

## 2022-07-23 DIAGNOSIS — C61 Malignant neoplasm of prostate: Secondary | ICD-10-CM | POA: Diagnosis not present

## 2022-07-23 DIAGNOSIS — C3411 Malignant neoplasm of upper lobe, right bronchus or lung: Secondary | ICD-10-CM

## 2022-07-23 DIAGNOSIS — Z191 Hormone sensitive malignancy status: Secondary | ICD-10-CM | POA: Diagnosis not present

## 2022-07-23 DIAGNOSIS — Z515 Encounter for palliative care: Secondary | ICD-10-CM

## 2022-07-23 NOTE — Progress Notes (Signed)
Virtual Visit via Telephone Note  I connected with Chris [name] on 07/23/22 at  2:40 PM EDT by telephone and verified that I am speaking with the correct person using two identifiers.  Location: Patient: Home Provider: Clinic   I discussed the limitations, risks, security and privacy concerns of performing an evaluation and management service by telephone and the availability of in person appointments. I also discussed with the patient that there may be a patient responsible charge related to this service. The patient expressed understanding and agreed to proceed.   History of Present Illness: Chris [name] is a 61 y.o. male with multiple medical problems including stage IV non-small cell lung cancer with metastasis to femur/liver diagnosed in December 2022.  Patient is currently on treatment with carbo/Alimta/Libtayo but has had poor tolerance to treatment.  He is referred to radiation oncology for SBRT of right upper lobe lung lesion and right proximal femoral lesion.  Patient has had severe anxiety and was referred to palliative care to help address goals and manage ongoing symptoms.   Observations/Objective: Spoke with patient by phone.  He reports he is doing reasonably well.  He still has some right hip pain, most frequently severe when he sits to stands or starts ambulating but improves after walking some distance.  He is unsure if initiation of meloxicam and tramadol have helped.  Pain has not limited him significantly in his ability to continue working.  We talked about liberalizing tramadol as needed.  However, I discouraged him from taking any when he plans to work or drive.  Patient is pending CT simulation study for additional XRT to the hip.  Hopefully, that will help improve the pain.  Assessment and Plan: Neoplasm related pain -continue meloxicam/tramadol.  Patient pending SBRT to the hip  Follow Up Instructions: Follow-up telephone visit 1 month   I discussed the  assessment and treatment plan with the patient. The patient was provided an opportunity to ask questions and all were answered. The patient agreed with the plan and demonstrated an understanding of the instructions.   The patient was advised to call back or seek an in-person evaluation if the symptoms worsen or if the condition fails to improve as anticipated.  I provided 5 minutes of non-face-to-face time during this encounter.   Irean Hong, NP

## 2022-07-24 ENCOUNTER — Encounter: Payer: Self-pay | Admitting: Cardiovascular Disease

## 2022-07-24 ENCOUNTER — Ambulatory Visit
Admission: RE | Admit: 2022-07-24 | Discharge: 2022-07-24 | Disposition: A | Discharge status: Home / Self Care | Payer: BC Managed Care – PPO | Source: Ambulatory Visit | Attending: Radiation Oncology | Admitting: Radiation Oncology

## 2022-07-24 DIAGNOSIS — C7951 Secondary malignant neoplasm of bone: Secondary | ICD-10-CM | POA: Insufficient documentation

## 2022-07-24 DIAGNOSIS — Z191 Hormone sensitive malignancy status: Secondary | ICD-10-CM | POA: Diagnosis not present

## 2022-07-24 DIAGNOSIS — C61 Malignant neoplasm of prostate: Secondary | ICD-10-CM | POA: Diagnosis not present

## 2022-07-24 DIAGNOSIS — G4733 Obstructive sleep apnea (adult) (pediatric): Secondary | ICD-10-CM | POA: Diagnosis not present

## 2022-07-26 ENCOUNTER — Telehealth: Payer: Self-pay | Admitting: *Deleted

## 2022-07-26 ENCOUNTER — Encounter: Payer: Self-pay | Admitting: Hospice and Palliative Medicine

## 2022-07-26 NOTE — Telephone Encounter (Signed)
Spoke with patient's wife. She had a question for Josh, NP and requested a phone call from [place] (per [place]). I called her back and answered questions regarding the plan of care. Pt has an apt in 1 month with Josh- via telephone. Pt will keep apt with Dr. B as planned next week. She wanted to make sure that [name] was aware that patient will be starting radiation and was agreeable to radiation therapy. Discussed care with wife, that [name] is aware that patient will have radiation. She thanked me for calling her back.

## 2022-07-30 ENCOUNTER — Other Ambulatory Visit: Payer: BC Managed Care – PPO

## 2022-07-30 DIAGNOSIS — C7951 Secondary malignant neoplasm of bone: Secondary | ICD-10-CM | POA: Insufficient documentation

## 2022-07-30 DIAGNOSIS — Z191 Hormone sensitive malignancy status: Secondary | ICD-10-CM | POA: Diagnosis not present

## 2022-07-30 DIAGNOSIS — C61 Malignant neoplasm of prostate: Secondary | ICD-10-CM | POA: Diagnosis not present

## 2022-07-31 ENCOUNTER — Other Ambulatory Visit: Payer: Self-pay

## 2022-07-31 ENCOUNTER — Encounter: Payer: Self-pay | Admitting: Hospice and Palliative Medicine

## 2022-07-31 ENCOUNTER — Ambulatory Visit
Admission: RE | Admit: 2022-07-31 | Discharge: 2022-07-31 | Disposition: A | Discharge status: Home / Self Care | Payer: BC Managed Care – PPO | Source: Ambulatory Visit | Attending: Radiation Oncology | Admitting: Radiation Oncology

## 2022-07-31 DIAGNOSIS — C7951 Secondary malignant neoplasm of bone: Secondary | ICD-10-CM | POA: Diagnosis not present

## 2022-07-31 LAB — RAD ONC ARIA SESSION SUMMARY
Course Elapsed Days: 0
Plan Fractions Treated to Date: 1
Plan Prescribed Dose Per Fraction: 6 Gy
Plan Total Fractions Prescribed: 5
Plan Total Prescribed Dose: 30 Gy
Reference Point Dosage Given to Date: 6 Gy
Reference Point Session Dosage Given: 6 Gy
Session Number: 1

## 2022-08-01 ENCOUNTER — Inpatient Hospital Stay: Payer: BC Managed Care – PPO

## 2022-08-01 ENCOUNTER — Inpatient Hospital Stay: Payer: BC Managed Care – PPO | Admitting: Occupational Therapy

## 2022-08-01 ENCOUNTER — Inpatient Hospital Stay (HOSPITAL_BASED_OUTPATIENT_CLINIC_OR_DEPARTMENT_OTHER): Payer: BC Managed Care – PPO | Admitting: Internal Medicine

## 2022-08-01 ENCOUNTER — Inpatient Hospital Stay: Payer: BC Managed Care – PPO | Attending: Internal Medicine

## 2022-08-01 ENCOUNTER — Encounter: Payer: Self-pay | Admitting: Internal Medicine

## 2022-08-01 ENCOUNTER — Other Ambulatory Visit: Payer: Self-pay | Admitting: Hospice and Palliative Medicine

## 2022-08-01 ENCOUNTER — Other Ambulatory Visit: Payer: Self-pay | Admitting: *Deleted

## 2022-08-01 VITALS — BP 125/94 | HR 63 | Temp 97.6°F | Resp 16 | Wt 241.0 lb

## 2022-08-01 DIAGNOSIS — C7951 Secondary malignant neoplasm of bone: Secondary | ICD-10-CM | POA: Diagnosis not present

## 2022-08-01 DIAGNOSIS — C787 Secondary malignant neoplasm of liver and intrahepatic bile duct: Secondary | ICD-10-CM | POA: Insufficient documentation

## 2022-08-01 DIAGNOSIS — Z923 Personal history of irradiation: Secondary | ICD-10-CM | POA: Insufficient documentation

## 2022-08-01 DIAGNOSIS — Z5111 Encounter for antineoplastic chemotherapy: Secondary | ICD-10-CM | POA: Diagnosis not present

## 2022-08-01 DIAGNOSIS — C4442 Squamous cell carcinoma of skin of scalp and neck: Secondary | ICD-10-CM | POA: Insufficient documentation

## 2022-08-01 DIAGNOSIS — C3411 Malignant neoplasm of upper lobe, right bronchus or lung: Secondary | ICD-10-CM | POA: Diagnosis not present

## 2022-08-01 DIAGNOSIS — Z8589 Personal history of malignant neoplasm of other organs and systems: Secondary | ICD-10-CM

## 2022-08-01 DIAGNOSIS — Z79899 Other long term (current) drug therapy: Secondary | ICD-10-CM | POA: Diagnosis not present

## 2022-08-01 DIAGNOSIS — K219 Gastro-esophageal reflux disease without esophagitis: Secondary | ICD-10-CM | POA: Diagnosis not present

## 2022-08-01 DIAGNOSIS — M25551 Pain in right hip: Secondary | ICD-10-CM

## 2022-08-01 LAB — CBC WITH DIFFERENTIAL/PLATELET
Abs Immature Granulocytes: 0.01 10*3/uL (ref 0.00–0.07)
Basophils Absolute: 0 10*3/uL (ref 0.0–0.1)
Basophils Relative: 1 %
Eosinophils Absolute: 0.1 10*3/uL (ref 0.0–0.5)
Eosinophils Relative: 1 %
HCT: 45.2 % (ref 39.0–52.0)
Hemoglobin: 15.9 g/dL (ref 13.0–17.0)
Immature Granulocytes: 0 %
Lymphocytes Relative: 19 %
Lymphs Abs: 1 10*3/uL (ref 0.7–4.0)
MCH: 31.4 pg (ref 26.0–34.0)
MCHC: 35.2 g/dL (ref 30.0–36.0)
MCV: 89.3 fL (ref 80.0–100.0)
Monocytes Absolute: 0.7 10*3/uL (ref 0.1–1.0)
Monocytes Relative: 13 %
Neutro Abs: 3.4 10*3/uL (ref 1.7–7.7)
Neutrophils Relative %: 66 %
Platelets: 175 10*3/uL (ref 150–400)
RBC: 5.06 MIL/uL (ref 4.22–5.81)
RDW: 13.7 % (ref 11.5–15.5)
WBC: 5.2 10*3/uL (ref 4.0–10.5)
nRBC: 0 % (ref 0.0–0.2)

## 2022-08-01 LAB — COMPREHENSIVE METABOLIC PANEL
ALT: 21 U/L (ref 0–44)
AST: 18 U/L (ref 15–41)
Albumin: 4.2 g/dL (ref 3.5–5.0)
Alkaline Phosphatase: 71 U/L (ref 38–126)
Anion gap: 7 (ref 5–15)
BUN: 24 mg/dL — ABNORMAL HIGH (ref 8–23)
CO2: 23 mmol/L (ref 22–32)
Calcium: 9.2 mg/dL (ref 8.9–10.3)
Chloride: 109 mmol/L (ref 98–111)
Creatinine, Ser: 1.1 mg/dL (ref 0.61–1.24)
GFR, Estimated: >60 mL/min (ref >60)
Glucose, Bld: 126 mg/dL — ABNORMAL HIGH (ref 70–99)
Potassium: 4 mmol/L (ref 3.5–5.1)
Sodium: 139 mmol/L (ref 135–145)
Total Bilirubin: 0.5 mg/dL (ref 0.3–1.2)
Total Protein: 7.4 g/dL (ref 6.5–8.1)

## 2022-08-01 MED ORDER — SODIUM CHLORIDE 0.9 % IV SOLN
Freq: Once | INTRAVENOUS | Status: AC
  Filled 2022-08-01: qty 250

## 2022-08-01 MED ORDER — TRAMADOL HCL 50 MG PO TABS: 50.0000 mg | ORAL_TABLET | Freq: Four times a day (QID) | ORAL | 0 refills | Status: DC | PRN

## 2022-08-01 MED ORDER — SODIUM CHLORIDE 0.9 % IV SOLN
350.0000 mg | Freq: Once | INTRAVENOUS | Status: AC
  Administered 2022-08-01: 350 mg via INTRAVENOUS
  Filled 2022-08-01: qty 7

## 2022-08-01 NOTE — Therapy (Signed)
[facility] [place], [place], [place], [address] Phone: [phone]   Fax:  [phone]  Occupational Therapy Screen:  Patient Details  Name: Chris [name] MRN: [id number] Date of Birth: 1961/03/17 No data recorded  Encounter Date: 08/01/2022   OT End of Session - 08/01/22 2038     Visit Number 0             Past Medical History:  Diagnosis Date   Anxiety    associated with medical care,  worsened by difficulty hearing, does better with wife present   Complication of anesthesia    wife states very anxious may need pre sedation   Coronary artery disease    GERD (gastroesophageal reflux disease)    Hearing loss    mild per wife   Hearing loss    no hearing aids per wife   Hyperlipidemia    Hypertension    MI (myocardial infarction) ([place]) 04/17/2006   Acute inferolateral wall MI with cardiogenic shock and complete heart block   Primary adenocarcinoma of upper lobe of right lung ([place])    Prostate cancer ([place])    Tobacco abuse    Wears glasses    Wears partial dentures    Upper    Past Surgical History:  Procedure Laterality Date   BRONCHIAL BIOPSY  10/09/2021   Procedure: BRONCHIAL BIOPSIES;  Surgeon: Collene Gobble, MD;  Location: [facility];  Service: Pulmonary;;   BRONCHIAL BRUSHINGS  10/09/2021   Procedure: BRONCHIAL BRUSHINGS;  Surgeon: Collene Gobble, MD;  Location: [facility] ENDOSCOPY;  Service: Pulmonary;;   BRONCHIAL NEEDLE ASPIRATION BIOPSY  10/09/2021   Procedure: BRONCHIAL NEEDLE ASPIRATION BIOPSIES;  Surgeon: Collene Gobble, MD;  Location: [facility] ENDOSCOPY;  Service: Pulmonary;;   CARDIAC CATHETERIZATION  2007   left, RCA 100% occluded ruptured plaque with thrombus in the proximal segment   CYST EXCISION N/A 08/30/2021   Procedure: EXCISION OF POSTERIOR SCALP CYST;  Surgeon: Clovis Riley, MD;  Location: [facility];  Service: General;  Laterality: N/A;   CYSTOSCOPY N/A 07/17/2018    Procedure: Consuela Mimes;  Surgeon: Franchot Gallo, MD;  Location: [facility];  Service: Urology;  Laterality: N/A;  no seeds in bladder per Dr Diona Fanti   FIDUCIAL MARKER PLACEMENT  10/09/2021   Procedure: FIDUCIAL MARKER PLACEMENT;  Surgeon: Collene Gobble, MD;  Location: [facility] ENDOSCOPY;  Service: Pulmonary;;   HAND SURGERY Right    has metal plate in arm   PROSTATE BIOPSY     RADIOACTIVE SEED IMPLANT N/A 07/17/2018   Procedure: RADIOACTIVE SEED IMPLANT/BRACHYTHERAPY IMPLANT;  Surgeon: Franchot Gallo, MD;  Location: [facility];  Service: Urology;  Laterality: N/A;  77 seeds   SPACE OAR INSTILLATION N/A 07/17/2018   Procedure: SPACE OAR INSTILLATION;  Surgeon: Franchot Gallo, MD;  Location: [facility];  Service: Urology;  Laterality: N/A;   VIDEO BRONCHOSCOPY WITH RADIAL ENDOBRONCHIAL ULTRASOUND  10/09/2021   Procedure: VIDEO BRONCHOSCOPY WITH RADIAL ENDOBRONCHIAL ULTRASOUND;  Surgeon: Collene Gobble, MD;  Location: [facility] ENDOSCOPY;  Service: Pulmonary;;   WRIST SURGERY  10/04/2012    There were no vitals filed for this visit.   Subjective Assessment - 08/01/22 2037     Subjective  My hip pain I had probably for now for 4 to 6 weeks.  I have been favoring it.  Using this cane to help me with sit <> the worst pain.  Can go up to 8-9 out  of 10.  But better when I am up on my feet.                            NOTE FROM DR Rogue Bussing 08/01/22: ASSESSMENT & PLAN:    Primary adenocarcinoma of upper lobe of right lung ([place]) # Non-small cell lung cancer/stage IV-favor adeno carcinoma-metastases to right femur/liver; synchronous squamous cell carcinoma scalp  SEP 25th, 2023- PET scan: Response to therapy noted; no residual activity in the right femoral metadiaphysis bony metastases [however see below regarding worsening pain]; liver metastases improved; no new sites of metastasis noted. Patient currently on maintanence  Libatayo.  STABLE; however worsening pain.-See below   # Proceed with Single agent Libtayo maintenance- Labs today reviewed;  acceptable for treatment today.  SEP 2023- TSH-WNL>    # right hip pain-status post radiation [[place]; DEC 2022]-PET-SEP 2023-showed decreased uptake in the right hip; however given worsening pain concerning for progression of disease.  S/p evaluation with Dr. Rebeca Allegra RT on 10/03 x5 Fx. Recommend evaluation with orthopedic. Also referral to [facility] re: right hip pain/ concerns of falls. order Zometa infusion.    # Low vit D- [July 2023- vit D- 25]; on  ergocalciferol- Calcium 9.2- improved/STABLE.       # Squamous cell carcinoma of the scalp-s/p excision; positive margins- on libtayo-no clinical evidence of progression.  Might need to consider radiation-if any local progression noted/also based on course lung cancer.  STABLE.     # GERD- on prilosec recommend PPI BID prior to meals- STABLE.     #Incidental findings on Imaging  PET scan ,SEP 2023: Atherosclerosis COPD; bilateral adenomas; nonobstructing left nephrolithiasis I reviewed/discussed/counseled the patient.    # IV access: PIV   # DISPOSITION: # referral to [facility] re: right hip pain/ concerns of falls.  #Libtayo;- treatment  Today.  # follow up in 3 weeks- MD;labs- cbc/cmp; Libtayo;- Dr.B   # I reviewed the blood work- with the patient in detail; also reviewed the imaging independently [as summarized above]; and with the patient in detail.     OT SCREEN 08/01/22: This OT was asked to screen patient in infusion room.  Patient scheduled to start 5 sessions of radiation to right hip.  Lesion on femur.  Patient reported he had the pain for about 4 to 6 weeks.  Favoring when walking.  Pain mostly with sit <>stand.  He is using a  cane for sit and stand to relief pressure.  Did discuss patient's case with Dr. Donella Stade.  Radiation should help for the pain.   Pt wants to be scheduled for  PT eval  after 5  sessions of radiation to assess if need  strengthening. PT order was put in  for pt to have PT eval with Vikki Ports at [facility] after radiation.               :           Visit Diagnosis: Acute right hip pain    Problem List Patient Active Problem List   Diagnosis Date Noted   Primary adenocarcinoma of upper lobe of right lung ([place]) 10/18/2021   Encounter for antineoplastic chemotherapy 10/18/2021   Encounter for antineoplastic immunotherapy 10/18/2021   Right upper lobe pulmonary nodule 09/27/2021   Squamous cell carcinoma of scalp 09/13/2021   Tobacco abuse 04/08/2018   Malignant neoplasm of prostate ([place]) 03/25/2018   Coronary artery disease 10/27/2013   Hyperlipidemia 10/27/2013  Rosalyn Gess, OTR/L,CLT 08/01/2022, 8:38 PM  [facility] [place], [place], [place], [address] Phone: [phone]   Fax:  [phone]  Name: Chris [name] MRN: [id number] Date of Birth: 1961/10/17

## 2022-08-01 NOTE — Progress Notes (Signed)
Pt in for follow up, had questions if lung cancer diagnosis is mets or if it is a new primary.  Reports increased pain in femur bilaterally.

## 2022-08-01 NOTE — Assessment & Plan Note (Addendum)
#   Non-small cell lung cancer/stage IV-favor adeno carcinoma-metastases to right femur/liver; synchronous squamous cell carcinoma scalp  SEP 25th, 2023- PET scan: Response to therapy noted; no residual activity in the right femoral metadiaphysis bony metastases [however see below regarding worsening pain]; liver metastases improved; no new sites of metastasis noted. Patient currently on maintanence Libatayo.  STABLE; however worsening pain.-See below  # Proceed with Single agent Libtayo maintenance- Labs today reviewed;  acceptable for treatment today.  SEP 2023- TSH-WNL>   # right hip pain-status post radiation [[place]; DEC 2022]-PET-SEP 2023-showed decreased uptake in the right hip; however given worsening pain concerning for progression of disease.  S/p evaluation with Dr. Rebeca Allegra RT on 10/03 x5 Fx. Recommend evaluation with orthopedic. Also referral to [facility] re: right hip pain/ concerns of falls. order Zometa infusion.   # Low vit D- [July 2023- vit D- 25]; on  ergocalciferol- Calcium 9.2- improved/STABLE.      # Squamous cell carcinoma of the scalp-s/p excision; positive margins- on libtayo-no clinical evidence of progression.  Might need to consider radiation-if any local progression noted/also based on course lung cancer.  STABLE.    # GERD- on prilosec recommend PPI BID prior to meals- STABLE.    #Incidental findings on Imaging  PET scan ,SEP 2023: Atherosclerosis COPD; bilateral adenomas; nonobstructing left nephrolithiasis I reviewed/discussed/counseled the patient.   # IV access: PIV  # DISPOSITION: # referral to [facility] re: right hip pain/ concerns of falls.  #Libtayo;- treatment  Today.  # follow up in 3 weeks- MD;labs- cbc/cmp; Libtayo;- Dr.B  # I reviewed the blood work- with the patient in detail; also reviewed the imaging independently [as summarized above]; and with the patient in detail.

## 2022-08-01 NOTE — Patient Instructions (Signed)
[facility] [place]  Discharge Instructions: Thank you for choosing [facility] to provide your oncology and hematology care.  If you have a lab appointment with the Cancer Center, please go directly to the Cancer Center and check in at the registration area.  Wear comfortable clothing and clothing appropriate for easy access to any Portacath or PICC line.   We strive to give you quality time with your provider. You may need to reschedule your appointment if you arrive late (15 or more minutes).  Arriving late affects you and other patients whose appointments are after yours.  Also, if you miss three or more appointments without notifying the office, you may be dismissed from the clinic at the provider's discretion.      For prescription refill requests, have your pharmacy contact our office and allow 72 hours for refills to be completed.       To help prevent nausea and vomiting after your treatment, we encourage you to take your nausea medication as directed.  BELOW ARE SYMPTOMS THAT SHOULD BE REPORTED IMMEDIATELY: *FEVER GREATER THAN 100.4 F (38 C) OR HIGHER *CHILLS OR SWEATING *NAUSEA AND VOMITING THAT IS NOT CONTROLLED WITH YOUR NAUSEA MEDICATION *UNUSUAL SHORTNESS OF BREATH *UNUSUAL BRUISING OR BLEEDING *URINARY PROBLEMS (pain or burning when urinating, or frequent urination) *BOWEL PROBLEMS (unusual diarrhea, constipation, pain near the anus) TENDERNESS IN MOUTH AND THROAT WITH OR WITHOUT PRESENCE OF ULCERS (sore throat, sores in mouth, or a toothache) UNUSUAL RASH, SWELLING OR PAIN  UNUSUAL VAGINAL DISCHARGE OR ITCHING   Items with * indicate a potential emergency and should be followed up as soon as possible or go to the Emergency Department if any problems should occur.  Please show the CHEMOTHERAPY ALERT CARD or IMMUNOTHERAPY ALERT CARD at check-in to the Emergency Department and triage nurse.  Should you have questions after your  visit or need to cancel or reschedule your appointment, please contact [facility]  [phone] and follow the prompts.  Office hours are 8:00 a.m. to 4:30 p.m. Monday - Friday. Please note that voicemails left after 4:00 p.m. may not be returned until the following business day.  We are closed weekends and major holidays. You have access to a nurse at all times for urgent questions. Please call the main number to the clinic [phone] and follow the prompts.  For any non-urgent questions, you may also contact your provider using MyChart. We now offer e-Visits for anyone 18 and older to request care online for non-urgent symptoms. For details visit [email or web address].   Also download the MyChart app! Go to the app store, search "MyChart", open the app, select , and log in with your MyChart username and password.  Masks are optional in the cancer centers. If you would like for your care team to wear a mask while they are taking care of you, please let them know. For doctor visits, patients may have with them one support person who is at least 61 years old. At this time, visitors are not allowed in the infusion area.   

## 2022-08-01 NOTE — Progress Notes (Signed)
[facility] CONSULT NOTE  Patient Care Team: Casilda Carls, MD as PCP - General (Internal Medicine) Lorretta Harp, MD as PCP - Cardiology (Cardiology) Curt Bears, MD as Consulting Physician (Oncology) Casilda Carls, MD as Referring Physician (Internal Medicine) Cammie Sickle, MD as Consulting Physician (Internal Medicine) Cammie Sickle, MD as Consulting Physician (Internal Medicine)  CHIEF COMPLAINTS/PURPOSE OF CONSULTATION: lung cancer  #  Oncology History Overview Note  DIAGNOSIS: 1) stage IV (T1c, N0, M1 C) non-small cell lung cancer favoring adenocarcinoma presented with right lung apical nodule in addition to metastatic disease in the right hepatic lobe and the proximal right femoral diaphysis diagnosed and December 2022. 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    QNS; Guardant 360 showed positive KRAS G12C mutation  FINAL MICROSCOPIC DIAGNOSIS:   A. LUNG, RUL, FINE NEEDLE ASPIRATION:  - Malignant cells consistent with non-small cell carcinoma, see comment   B. LUNG, RUL, BRUSHING:  - Malignant cells consistent with non-small cell carcinoma, see comment       COMMENT:   A and B.  Dr. Saralyn Pilar reviewed the case and concurs with the diagnosis.  Only rare malignant cells are present on the cellblock.  Immunohistochemical stains were attempted and show that the tumor cells  have patchy staining for TTF-1 whereas p63, p40 and CK5/6 are negative.  The findings are nondiagnostic but suggestive of a lung adenocarcinoma.  Dr. Lamonte Sakai was notified on 10/13/2021.   SEP-OCT 2022-  [Dermatology]-   Poorly differentiated squamous cell carcinoma; -  Carcinoma extends to the edges of the excision specimen   IMPRESSION: 1. The spiculated nodule at the right lung apex on recent neck CT is hypermetabolic and is concerning for primary bronchogenic  carcinoma. Tissue sampling recommended. 2. Hypermetabolic activity within the occipital scalp and small previously demonstrated right occipital lymph node compatible with known squamous cell carcinoma. Evaluation of the head and neck limited by motion artifact. 3. Hypermetabolic lesions inferiorly in the right hepatic lobe and in the proximal right femoral diaphysis suspicious for metastatic disease, primary uncertain in this patient with a history of prostate cancer. Correlate with PSA levels. Abdominal MRI without and with contrast may be helpful for further characterization of the liver lesion.  # s/p RT tto RUL [[place]]; right hip RT;   # 2007MI-CAD [s/p stent-Dr.Berry; EF 2021- 58%]     Primary adenocarcinoma of upper lobe of right lung ([place])  10/18/2021 Initial Diagnosis   Primary adenocarcinoma of upper lobe of right lung ([place])   10/18/2021 Cancer Staging   Staging form: Lung, AJCC 8th Edition - Clinical: Stage IVB (cT1c, cN0, cM1c) - Signed by Curt Bears, MD on 10/18/2021   11/01/2021 - 05/30/2022 Chemotherapy   Patient is on Treatment Plan : LUNG NSCLC Pemetrexed + Carboplatin q21d x 4 Cycles     11/01/2021 -  Chemotherapy   Patient is on Treatment Plan : LUNG NSCLC Libtayo q  21d       HISTORY OF PRESENTING ILLNESS: Ambulating independently.  Accompanied by his wife.   Chris [name] 61 y.o.  male metastatic stage IV non-small cell lung cancer/favor adenocarcinoma-currently on maintained libatyo here for follow-up-review of the PET scan.  Patient continues to have increased pain in femur bilaterally; patient s/p evaluation with radiation oncology.  Pain is worse with standing/with relation.  Currently on radiation to right hip.  Is currently walking with a cane.  No falls.  Patient complains that right hip pain is getting worse. rating pain 8/10. he is only taking ibuprofen as needed for pain.  Denies any fever chills.  No nausea no vomiting.   Review of Systems   Constitutional:  Positive for malaise/fatigue. Negative for chills, diaphoresis, fever and weight loss.  HENT:  Positive for hearing loss. Negative for nosebleeds and sore throat.   Eyes:  Negative for double vision.  Respiratory:  Negative for cough, hemoptysis, sputum production, shortness of breath and wheezing.   Cardiovascular:  Negative for chest pain, palpitations, orthopnea and leg swelling.  Gastrointestinal:  Negative for abdominal pain, blood in stool, diarrhea, heartburn, melena, nausea and vomiting.  Genitourinary:  Negative for dysuria, frequency and urgency.  Musculoskeletal:  Negative for back pain.  Skin: Negative.  Negative for itching and rash.  Neurological:  Negative for dizziness, tingling, focal weakness, weakness and headaches.  Endo/Heme/Allergies:  Does not bruise/bleed easily.  Psychiatric/Behavioral:  Negative for depression. The patient does not have insomnia.      MEDICAL HISTORY:  Past Medical History:  Diagnosis Date   Anxiety    associated with medical care,  worsened by difficulty hearing, does better with wife present   Complication of anesthesia    wife states very anxious may need pre sedation   Coronary artery disease    GERD (gastroesophageal reflux disease)    Hearing loss    mild per wife   Hearing loss    no hearing aids per wife   Hyperlipidemia    Hypertension    MI (myocardial infarction) ([place]) 04/17/2006   Acute inferolateral wall MI with cardiogenic shock and complete heart block   Primary adenocarcinoma of upper lobe of right lung ([place])    Prostate cancer ([place])    Tobacco abuse    Wears glasses    Wears partial dentures    Upper    SURGICAL HISTORY: Past Surgical History:  Procedure Laterality Date   BRONCHIAL BIOPSY  10/09/2021   Procedure: BRONCHIAL BIOPSIES;  Surgeon: Collene Gobble, MD;  Location: [facility];  Service: Pulmonary;;   BRONCHIAL BRUSHINGS  10/09/2021   Procedure: BRONCHIAL BRUSHINGS;  Surgeon: Collene Gobble, MD;  Location[facility] ENDOSCOPY;  Service: Pulmonary;;   BRONCHIAL NEEDLE ASPIRATION BIOPSY  10/09/2021   Procedure: BRONCHIAL NEEDLE ASPIRATION BIOPSIES;  Surgeon: Collene Gobble, MD;  Location: [place] [facility] ENDOSCOPY;  Service: Pulmonary;;   CARDIAC CATHETERIZATION  2007   left, RCA 100% occluded ruptured plaque with thrombus in the proximal segment   CYST EXCISION N/A 08/30/2021   Procedure: EXCISION OF POSTERIOR SCALP CYST;  Surgeon: Clovis Riley, MD;  Location: [facility];  Service: General;  Laterality: N/A;   CYSTOSCOPY N/A 07/17/2018   Procedure: Consuela Mimes;  Surgeon: Franchot Gallo, MD;  Location[facility];  Service: Urology;  Laterality: N/A;  no seeds in bladder per Dr Diona Fanti   FIDUCIAL MARKER PLACEMENT  10/09/2021   Procedure: FIDUCIAL MARKER PLACEMENT;  Surgeon: Collene Gobble, MD;  Location: [facility] ENDOSCOPY;  Service: Pulmonary;;   HAND SURGERY Right    has metal plate in arm   PROSTATE BIOPSY     RADIOACTIVE SEED IMPLANT N/A 07/17/2018   Procedure: RADIOACTIVE SEED IMPLANT/BRACHYTHERAPY IMPLANT;  Surgeon: Franchot Gallo, MD;  Location[facility];  Service: Urology;  Laterality: N/A;  77 seeds   SPACE OAR INSTILLATION N/A 07/17/2018   Procedure: SPACE OAR INSTILLATION;  Surgeon: Franchot Gallo, MD;  Location[facility];  Service: Urology;  Laterality: N/A;   VIDEO BRONCHOSCOPY WITH RADIAL ENDOBRONCHIAL ULTRASOUND  10/09/2021   Procedure: VIDEO BRONCHOSCOPY WITH RADIAL ENDOBRONCHIAL ULTRASOUND;  Surgeon: Collene Gobble, MD;  Location: [facility] ENDOSCOPY;  Service: Pulmonary;;   WRIST SURGERY  10/04/2012    SOCIAL HISTORY: Social History   Socioeconomic History   Marital status: Married    Spouse name: Not on file   Number of children: Not on file   Years of education: Not on file   Highest education level: Not on file  Occupational History   Not on file  Tobacco Use   Smoking status: Every Day    Packs/day:  1.50    Years: 42.00    Total pack years: 63.00    Types: Cigarettes   Smokeless tobacco: Never  Vaping Use   Vaping Use: Never used  Substance and Sexual Activity   Alcohol use: No   Drug use: No   Sexual activity: Yes    Birth control/protection: None  Other Topics Concern   Not on file  Social History Narrative   10-04 19 Unable to ask abuse questions wife with him today.   Social Determinants of Health   Financial Resource Strain: Not on file  Food Insecurity: Not on file  Transportation Needs: Not on file  Physical Activity: Not on file  Stress: Not on file  Social Connections: Not on file  Intimate Partner Violence: Not on file    FAMILY HISTORY: Family History  Problem Relation Age of Onset   Breast cancer Mother    Prostate cancer Neg Hx    Kidney cancer Neg Hx    Cancer Neg Hx     ALLERGIES:  has No Known Allergies.  MEDICATIONS:  Current Outpatient Medications  Medication Sig Dispense Refill   ALPRAZolam (XANAX) 0.25 MG tablet Take 1 tablet (0.25 mg total) by mouth at bedtime. 15 tablet 0   atorvastatin (LIPITOR) 20 MG tablet Take 1 tablet (20 mg total) by mouth daily. 90 tablet 3   clopidogrel (PLAVIX) 75 MG tablet Take 1 tablet (75 mg total) by mouth daily. 90 tablet 3   ergocalciferol (VITAMIN D2) 1.25 MG (50000 UT) capsule Take 1 capsule (50,000 Units total) by mouth once a week. 12 capsule 1   ketoconazole (NIZORAL) 2 % cream Apply 1 application. topically 2 (two) times daily as needed for irritation.     meloxicam (MOBIC) 15 MG tablet Take 1 tablet (15 mg total) by mouth daily. 30 tablet 2   metoprolol tartrate (LOPRESSOR) 25 MG tablet Take 1 tablet (25 mg total) by mouth daily. 90 tablet 3   pantoprazole (PROTONIX) 40 MG tablet Take 1 tablet (40 mg total) by mouth daily. 60 tablet 1   sertraline (ZOLOFT) 100 MG tablet TAKE 1 TABLET BY MOUTH EVERY DAY 90 tablet 0   tamsulosin (FLOMAX) 0.4 MG CAPS capsule Take 1 capsule (0.4 mg total) by mouth daily.  30 capsule 3   traMADol (ULTRAM) 50 MG tablet Take 1 tablet (50 mg total) by mouth every 6 (six) hours as needed. 30 tablet 0   prochlorperazine (COMPAZINE) 10 MG tablet Take 1 tablet (10 mg total) by mouth every 6 (six) hours as needed for nausea or vomiting. (Patient not taking: Reported on 08/01/2022) 30 tablet 1   No current facility-administered medications for this visit.   Facility-Administered Medications Ordered in Other Visits  Medication Dose Route Frequency Provider Last Rate Last Admin   cemiplimab-rwlc (LIBTAYO) 350 mg in sodium chloride 0.9 % 100 mL  chemo infusion  350 mg Intravenous Once Cammie Sickle, MD 214 mL/hr at 08/01/22 1038 350 mg at 08/01/22 1038      .  PHYSICAL EXAMINATION: ECOG PERFORMANCE STATUS: 1 - Symptomatic but completely ambulatory  Vitals:   08/01/22 0921  BP: (!) 125/94  Pulse: 63  Resp: 16  Temp: 97.6 F (36.4 C)  SpO2: 100%   Filed Weights   08/01/22 0921  Weight: 241 lb (109.3 kg)    Physical Exam Vitals and nursing note reviewed.  HENT:     Head: Normocephalic and atraumatic.     Mouth/Throat:     Pharynx: Oropharynx is clear.  Eyes:     Extraocular Movements: Extraocular movements intact.     Pupils: Pupils are equal, round, and reactive to light.  Cardiovascular:     Rate and Rhythm: Normal rate and regular rhythm.  Pulmonary:     Comments: Decreased breath sounds bilaterally.  Abdominal:     Palpations: Abdomen is soft.  Musculoskeletal:        General: Normal range of motion.     Cervical back: Normal range of motion.  Skin:    General: Skin is warm.  Neurological:     General: No focal deficit present.     Mental Status: He is alert and oriented to person, place, and time.  Psychiatric:        Behavior: Behavior normal.        Judgment: Judgment normal.      LABORATORY DATA:  I have reviewed the data as listed Lab Results  Component Value Date   WBC 5.2 08/01/2022   HGB 15.9 08/01/2022   HCT 45.2  08/01/2022   MCV 89.3 08/01/2022   PLT 175 08/01/2022   Recent Labs    06/20/22 0912 07/11/22 0812 08/01/22 0845  NA 139 138 139  K 4.5 3.7 4.0  CL 105 110 109  CO2 _0 GLUCOSE 113* 138* 126*  BUN 17 19 24*  CREATININE 1.21 0.96 1.10  CALCIUM 8.9 8.8* 9.2  GFRNONAA >60 >60 >60  PROT 7.5 7.2 7.4  ALBUMIN 4.1 4.1 4.2  AST _1 ALT _2 ALKPHOS 70 65 71  BILITOT 0.5 0.7 0.5    RADIOGRAPHIC STUDIES: I have personally reviewed the radiological images as listed and agreed with the findings in the report. NM PET Image Restage (PS) Skull Base to Thigh (F-18 FDG)  Result Date: 07/23/2022 CLINICAL DATA:  Subsequent treatment strategy for metastatic non-small cell lung cancer with ongoing chemotherapy. Right hip pain. EXAM: NUCLEAR MEDICINE PET SKULL BASE TO THIGH TECHNIQUE: 12.1 mCi F-18 FDG was injected intravenously. Full-ring PET imaging was performed from the skull base to thigh after the radiotracer. CT data was obtained and used for attenuation correction and anatomic localization. Fasting blood glucose: 116 mg/dl COMPARISON:  09/28/2021 PET-CT. 05/23/2022 CT chest, abdomen and pelvis. FINDINGS: Mediastinal blood pool activity: SUV max 2.6 Liver activity: SUV max NA NECK: No hypermetabolic lymph nodes in the neck. Evaluation of the skull base and neck is limited by substantial patient motion. Incidental CT findings: New complete opacification of the left maxillary sinus, similar. CHEST: No enlarged or hypermetabolic axillary, mediastinal or hilar lymph nodes. Mildly hypermetabolic irregular solid 1.7 x 1.0 cm apical right upper lobe pulmonary nodule with max SUV 3.3 (series 3/image 9), decreased from 2.4 x 1.3 cm on 09/28/2021 PET-CT with previous max SUV 5.2. No additional hypermetabolic pulmonary findings. Incidental CT findings: Mild-to-moderate  centrilobular emphysema. Coronary atherosclerosis. Atherosclerotic nonaneurysmal thoracic aorta. ABDOMEN/PELVIS: Hypodense  segment 5 right liver 0.9 cm lesion demonstrates no residual discrete hypermetabolism, decreased from 1.8 cm on 09/28/2021 PET-CT. No hypermetabolic liver lesions on today's scan. No abnormal hypermetabolic activity within the pancreas, adrenal glands, or spleen. No hypermetabolic lymph nodes in the abdomen or pelvis. Incidental CT findings: Right adrenal 2.0 cm nodule with density 9 HU and left adrenal 1.7 cm nodule with density 19 HU, without hypermetabolism, stable size, previously characterized as benign adenomas. Nonobstructing 3 mm upper left renal stone. Atherosclerotic nonaneurysmal abdominal aorta. Mild sigmoid diverticulosis. Brachytherapy seeds throughout the prostate. SKELETON: Residual low level activity within the proximal right femoral metadiaphysis with max SUV 2.4, decreased from previous max SUV 7.4, with surrounding patchy low level soft tissue hypermetabolism without discrete CT correlate, compatible with post treatment change. No new focal hypermetabolic skeletal lesions. Incidental CT findings: None. IMPRESSION: 1. Interval positive response to therapy. 2. Residual low level activity within the proximal right femoral metadiaphysis bone metastasis, significantly decreased from prior PET-CT, with surrounding patchy low level soft tissue hypermetabolism without discrete noncontrast CT correlate noncontrast, compatible with post treatment change. 3. Mildly hypermetabolic apical right upper lobe irregular solid pulmonary nodule is decreased in size and metabolism. 4. Solitary segment 5 right liver metastasis is decreased in size and demonstrates no residual hypermetabolism. 5. No new sites of hypermetabolic metastatic disease. 6. Chronic findings include: Chronic left maxillary sinus opacification. Coronary atherosclerosis. Stable bilateral adrenal adenomas, for which no follow-up imaging is recommended. Mild sigmoid diverticulosis. Nonobstructing left nephrolithiasis. Aortic Atherosclerosis  (ICD10-I70.0) and Emphysema (ICD10-J43.9). Electronically Signed   By: Ilona Sorrel M.D.   On: 07/23/2022 08:54   DG HIP UNILAT W OR W/O PELVIS 2-3 VIEWS RIGHT  Result Date: 07/11/2022 CLINICAL DATA:  Right hip pain EXAM: DG HIP (WITH OR WITHOUT PELVIS) 2-3V RIGHT COMPARISON:  None Available. FINDINGS: No fracture or dislocation is seen. In the previous bone scan done on 05/23/2022, there was subtle increased uptake in the proximal shaft of right femur. In previous PET-CT done on 09/28/2021, there was focal increased uptake was noted in the medial aspect of proximal shaft of right femur. There is slightly mottled appearance in the medial cortex of proximal shaft of right femur corresponding to the area of abnormality in PET-CT. There is questionable low-density in right femur at the level of lesser trochanter which showed no focal increased tracer uptake in the previous PET-CT. There are numerous metallic densities in prostate suggesting previous brachytherapy. IMPRESSION: No recent fracture or dislocation is seen in pelvis. No significant degenerative changes are noted in the hips. There is small area of mottled appearance in the medial cortex of proximal shaft of right femur, possibly chronic inflammatory process or neoplastic process such as metastatic disease. There is no break in the cortical margins. Electronically Signed   By: Elmer Picker M.D.   On: 07/11/2022 15:50    ASSESSMENT & PLAN:   Primary adenocarcinoma of upper lobe of right lung ([address]) # Non-small cell lung cancer/stage IV-favor adeno carcinoma-metastases to right femur/liver; synchronous squamous cell carcinoma scalp  SEP 25th, 2023- PET scan: Response to therapy noted; no residual activity in the right femoral metadiaphysis bony metastases [however see below regarding worsening pain]; liver metastases improved; no new sites of metastasis noted. Patient currently on maintanence Libatayo.  STABLE; however worsening pain.-See  below  # Proceed with Single agent Libtayo maintenance- Labs today reviewed;  acceptable for treatment today.  SEP 2023- TSH-WNL>   #  right hip pain-status post radiation [[place]; [place] 2022]-PET-SEP 2023-showed decreased uptake in the right hip; however given worsening pain concerning for progression of disease.  S/p evaluation with Dr. Rebeca Allegra RT on 10/03 x5 Fx. Recommend evaluation with orthopedic. Also referral to [facility] re: right hip pain/ concerns of falls. order Zometa infusion.   # Low vit D- [July 2023- vit D- 25]; on  ergocalciferol- Calcium 9.2- improved/STABLE.      # Squamous cell carcinoma of the scalp-s/p excision; positive margins- on libtayo-no clinical evidence of progression.  Might need to consider radiation-if any local progression noted/also based on course lung cancer.  STABLE.    # GERD- on prilosec recommend PPI BID prior to meals- STABLE.    #Incidental findings on Imaging  PET scan ,SEP 2023: Atherosclerosis COPD; bilateral adenomas; nonobstructing left nephrolithiasis I reviewed/discussed/counseled the patient.   # IV access: PIV  # DISPOSITION: # referral to [facility] re: right hip pain/ concerns of falls.  #Libtayo;- treatment  Today.  # follow up in 3 weeks- MD;labs- cbc/cmp; Libtayo;- Dr.B  # I reviewed the blood work- with the patient in detail; also reviewed the imaging independently [as summarized above]; and with the patient in detail.                All questions were answered. The patient knows to call the clinic with any problems, questions or concerns.       Cammie Sickle, MD 08/01/2022 10:50 AM

## 2022-08-02 ENCOUNTER — Ambulatory Visit
Admission: RE | Admit: 2022-08-02 | Discharge: 2022-08-02 | Disposition: A | Discharge status: Home / Self Care | Payer: BC Managed Care – PPO | Source: Ambulatory Visit | Attending: Radiation Oncology | Admitting: Radiation Oncology

## 2022-08-02 ENCOUNTER — Other Ambulatory Visit: Payer: Self-pay

## 2022-08-02 DIAGNOSIS — C7951 Secondary malignant neoplasm of bone: Secondary | ICD-10-CM | POA: Diagnosis not present

## 2022-08-02 LAB — RAD ONC ARIA SESSION SUMMARY
Course Elapsed Days: 2
Plan Fractions Treated to Date: 2
Plan Prescribed Dose Per Fraction: 6 Gy
Plan Total Fractions Prescribed: 5
Plan Total Prescribed Dose: 30 Gy
Reference Point Dosage Given to Date: 12 Gy
Reference Point Session Dosage Given: 6 Gy
Session Number: 2

## 2022-08-07 ENCOUNTER — Ambulatory Visit
Admission: RE | Admit: 2022-08-07 | Discharge: 2022-08-07 | Disposition: A | Discharge status: Home / Self Care | Payer: BC Managed Care – PPO | Source: Ambulatory Visit | Attending: Radiation Oncology | Admitting: Radiation Oncology

## 2022-08-07 ENCOUNTER — Other Ambulatory Visit: Payer: Self-pay

## 2022-08-07 DIAGNOSIS — C7951 Secondary malignant neoplasm of bone: Secondary | ICD-10-CM | POA: Diagnosis not present

## 2022-08-07 LAB — RAD ONC ARIA SESSION SUMMARY
Course Elapsed Days: 7
Plan Fractions Treated to Date: 3
Plan Prescribed Dose Per Fraction: 6 Gy
Plan Total Fractions Prescribed: 5
Plan Total Prescribed Dose: 30 Gy
Reference Point Dosage Given to Date: 18 Gy
Reference Point Session Dosage Given: 6 Gy
Session Number: 3

## 2022-08-09 ENCOUNTER — Other Ambulatory Visit: Payer: Self-pay

## 2022-08-09 ENCOUNTER — Ambulatory Visit
Admission: RE | Admit: 2022-08-09 | Discharge: 2022-08-09 | Disposition: A | Discharge status: Home / Self Care | Payer: BC Managed Care – PPO | Source: Ambulatory Visit | Attending: Radiation Oncology | Admitting: Radiation Oncology

## 2022-08-09 DIAGNOSIS — C7951 Secondary malignant neoplasm of bone: Secondary | ICD-10-CM | POA: Diagnosis not present

## 2022-08-09 LAB — RAD ONC ARIA SESSION SUMMARY
Course Elapsed Days: 9
Plan Fractions Treated to Date: 4
Plan Prescribed Dose Per Fraction: 6 Gy
Plan Total Fractions Prescribed: 5
Plan Total Prescribed Dose: 30 Gy
Reference Point Dosage Given to Date: 24 Gy
Reference Point Session Dosage Given: 6 Gy
Session Number: 4

## 2022-08-14 ENCOUNTER — Other Ambulatory Visit: Payer: Self-pay

## 2022-08-14 ENCOUNTER — Ambulatory Visit
Admission: RE | Admit: 2022-08-14 | Discharge: 2022-08-14 | Disposition: A | Discharge status: Home / Self Care | Payer: BC Managed Care – PPO | Source: Ambulatory Visit | Attending: Radiation Oncology | Admitting: Radiation Oncology

## 2022-08-14 DIAGNOSIS — C7951 Secondary malignant neoplasm of bone: Secondary | ICD-10-CM | POA: Diagnosis not present

## 2022-08-14 DIAGNOSIS — Z191 Hormone sensitive malignancy status: Secondary | ICD-10-CM | POA: Diagnosis not present

## 2022-08-14 DIAGNOSIS — Z510 Encounter for antineoplastic radiation therapy: Secondary | ICD-10-CM | POA: Diagnosis not present

## 2022-08-14 DIAGNOSIS — C61 Malignant neoplasm of prostate: Secondary | ICD-10-CM | POA: Diagnosis not present

## 2022-08-14 LAB — RAD ONC ARIA SESSION SUMMARY
Course Elapsed Days: 14
Plan Fractions Treated to Date: 5
Plan Prescribed Dose Per Fraction: 6 Gy
Plan Total Fractions Prescribed: 5
Plan Total Prescribed Dose: 30 Gy
Reference Point Dosage Given to Date: 30 Gy
Reference Point Session Dosage Given: 6 Gy
Session Number: 5

## 2022-08-16 ENCOUNTER — Ambulatory Visit: Payer: BC Managed Care – PPO | Attending: Radiation Oncology | Admitting: Physical Therapy

## 2022-08-16 DIAGNOSIS — M25551 Pain in right hip: Secondary | ICD-10-CM | POA: Diagnosis not present

## 2022-08-16 DIAGNOSIS — Z8589 Personal history of malignant neoplasm of other organs and systems: Secondary | ICD-10-CM | POA: Insufficient documentation

## 2022-08-16 NOTE — Therapy (Signed)
OUTPATIENT PHYSICAL THERAPY THORACOLUMBAR EVALUATION   Patient Name: Chris [name] MRN: [id number] DOB:06/23/1961, 61 y.o., male Today's Date: 08/21/2022   PT End of Session - 08/20/22 1103     Visit Number 1    Number of Visits 17    Date for PT Re-Evaluation 10/26/22    Authorization - Visit Number 1    Authorization - Number of Visits 10    PT Start Time 1130    PT Stop Time 1218    PT Time Calculation (min) 48 min    Activity Tolerance Patient tolerated treatment well    Behavior During Therapy [facility] for tasks assessed/performed             Past Medical History:  Diagnosis Date   Anxiety    associated with medical care,  worsened by difficulty hearing, does better with wife present   Complication of anesthesia    wife states very anxious may need pre sedation   Coronary artery disease    GERD (gastroesophageal reflux disease)    Hearing loss    mild per wife   Hearing loss    no hearing aids per wife   Hyperlipidemia    Hypertension    MI (myocardial infarction) ([place]) 04/17/2006   Acute inferolateral wall MI with cardiogenic shock and complete heart block   Primary adenocarcinoma of upper lobe of right lung (HCC)    Prostate cancer ([place])    Tobacco abuse    Wears glasses    Wears partial dentures    Upper   Past Surgical History:  Procedure Laterality Date   BRONCHIAL BIOPSY  10/09/2021   Procedure: BRONCHIAL BIOPSIES;  Surgeon: Collene Gobble, MD;  Location: [place];  Service: Pulmonary;;   BRONCHIAL BRUSHINGS  10/09/2021   Procedure: BRONCHIAL BRUSHINGS;  Surgeon: Collene Gobble, MD;  Location: [place];  Service: Pulmonary;;   BRONCHIAL NEEDLE ASPIRATION BIOPSY  10/09/2021   Procedure: BRONCHIAL NEEDLE ASPIRATION BIOPSIES;  Surgeon: Collene Gobble, MD;  Location: [facility];  Service: Pulmonary;;   CARDIAC CATHETERIZATION  2007   left, RCA 100% occluded ruptured plaque with thrombus in the proximal segment   CYST EXCISION N/A 08/30/2021    Procedure: EXCISION OF POSTERIOR SCALP CYST;  Surgeon: Clovis Riley, MD;  Location: [facility];  Service: General;  Laterality: N/A;   CYSTOSCOPY N/A 07/17/2018   Procedure: Consuela Mimes;  Surgeon: Franchot Gallo, MD;  Location: [facility];  Service: Urology;  Laterality: N/A;  no seeds in bladder per Dr Diona Fanti   FIDUCIAL MARKER PLACEMENT  10/09/2021   Procedure: FIDUCIAL MARKER PLACEMENT;  Surgeon: Collene Gobble, MD;  Location: [facility];  Service: Pulmonary;;   HAND SURGERY Right    has metal plate in arm   PROSTATE BIOPSY     RADIOACTIVE SEED IMPLANT N/A 07/17/2018   Procedure: RADIOACTIVE SEED IMPLANT/BRACHYTHERAPY IMPLANT;  Surgeon: Franchot Gallo, MD;  Location: [facility];  Service: Urology;  Laterality: N/A;  77 seeds   SPACE OAR INSTILLATION N/A 07/17/2018   Procedure: SPACE OAR INSTILLATION;  Surgeon: Franchot Gallo, MD;  [facility];  Service: Urology;  Laterality: N/A;   VIDEO BRONCHOSCOPY WITH RADIAL ENDOBRONCHIAL ULTRASOUND  10/09/2021   Procedure: VIDEO BRONCHOSCOPY WITH RADIAL ENDOBRONCHIAL ULTRASOUND;  Surgeon: Collene Gobble, MD;  Location: [place];  Service: Pulmonary;;   WRIST SURGERY  10/04/2012   Patient Active Problem List   Diagnosis Date Noted   Primary adenocarcinoma of upper lobe  of right lung ([place]) 10/18/2021   Encounter for antineoplastic chemotherapy 10/18/2021   Encounter for antineoplastic immunotherapy 10/18/2021   Right upper lobe pulmonary nodule 09/27/2021   Squamous cell carcinoma of scalp 09/13/2021   Tobacco abuse 04/08/2018   Malignant neoplasm of prostate ([place]) 03/25/2018   Coronary artery disease 10/27/2013   Hyperlipidemia 10/27/2013    PCP: Casilda Carls MD  REFERRING PROVIDER: Noreene Filbert MD  REFERRING DIAG: R hip pain   Rationale for Evaluation and Treatment Rehabilitation  THERAPY DIAG:  Pain in right hip - Plan: PT plan of care cert/re-cert,  CANCELED: PT plan of care cert/re-cert  ONSET DATE: Dec 2022  SUBJECTIVE:                                                                                                                                                                                           SUBJECTIVE STATEMENT: R hip pain (at femur metaphyseal) d/t to metastatic lunge cancer.  PERTINENT HISTORY:  Pt is a 61 year old male presenting with non-small cell lung cancer/stage IV-favor adeno carcinoma-metastases to right femur/liver originally diagnosed Dec 2022- with subsequent R hip pain for PT evaluation. synchronous squamous cell carcinoma scalp. Pt recently finished radiation last week for pain management, and will continue immunotherapy every 3 weeks. Pt wife present to help provide history. Pt reports pain in R hip began a month ago and was very sharp. Reports his pain his baseline nagging, that comes on sharply when he stands from any position, car transfers etc. He reports 10/10 pain that feels stiff and grabbing, like he is going to fall to the floor, goes away after standing for a minute. Lowest pain is 0/10. Pain is also increased by walking >63mns. Ambulates with SPC in R hand that he has used for the past month. 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   PAIN:  Are you having pain? Yes: NPRS scale: 0/10 Pain location: R hip/femur Pain description: achy at baseline; sharp and grabbing at standing Aggravating factors: standing from any position; prolonged walking Relieving factors: rest   PRECAUTIONS: Fall  WEIGHT BEARING RESTRICTIONS: No  FALLS:  Has patient fallen in last 6 months? No  LIVING ENVIRONMENT: Lives with: lives with their spouse Lives in: House/apartment Stairs: Yes: External: 4 steps; none Has following equipment at home: Single point cane and Shower bench  OCCUPATION: part time re-po  PLOF:  Independent  PATIENT GOALS: Be able to ride my harley safely   OBJECTIVE:  DIAGNOSTIC FINDINGS:   No recent fracture or dislocation is seen in pelvis. No significant degenerative changes are noted in the hips. There is small area of mottled appearance in the medial cortex of proximal shaft of right femur, possibly chronic inflammatory process or neoplastic process such as metastatic disease. There is no break in the cortical margins.  No fracture or dislocation is seen. In the previous bone scan done on 05/23/2022, there was subtle increased uptake in the proximal shaft of right femur. In previous PET-CT done on 09/28/2021, there was focal increased uptake was noted in the medial aspect of proximal shaft of right femur. There is slightly mottled appearance in the medial cortex of proximal shaft of right femur corresponding to the area of abnormality in PET-CT. There is questionable low-density in right femur at the level of lesser trochanter which showed no focal increased tracer uptake in the previous PET-CT. There are numerous metallic densities in prostate suggesting previous brachytherapy  PATIENT SURVEYS:  FOTO    SCREENING FOR RED FLAGS: Bowel or bladder incontinence: No Spinal tumors: No Cauda equina syndrome: No Compression fracture: Yes: possible Abdominal aneurysm: No  COGNITION:  Overall cognitive status: Within functional limits for tasks assessed     SENSATION: WFL  MUSCLE LENGTH: Hamstrings: Right shortened deg; Left WNL deg Marcello Moores test: Right shortened deg; Left WNL deg  POSTURE: rounded shoulders, forward head, increased thoracic kyphosis, and flexed trunk   PALPATION: TTP at proximal femur  LUMBAR ROM:   All lumbar AROM WNL without pain   LOWER EXTREMITY ROM:     Active  Right eval Left eval  Hip flexion Limited to 100d d/t pain WNL  Hip extension Limited to 10d d/t pain and increased muscle tension WNL  Hip abduction 50% limited pt  reported "weakness" WNL  Hip adduction    Hip internal rotation Nearly 0d d/t pain and "weakness" WNL  Hip external rotation WNL WNL  Knee flexion WNL WNL  Knee extension WNL WNL  Ankle dorsiflexion    Ankle plantarflexion    Ankle inversion    Ankle eversion     (Blank rows = not tested)  LOWER EXTREMITY MMT:    MMT Right eval Left eval  Hip flexion 3+ 5  Hip extension 4- 5  Hip abduction 3+ 5  Hip adduction    Hip internal rotation 3+ 5  Hip external rotation 4- 5  Knee flexion 4 5  Knee extension 4 5  Ankle dorsiflexion 4+ 5  Ankle plantarflexion    Ankle inversion    Ankle eversion     (Blank rows = not tested)  LUMBAR SPECIAL TESTS:  Marcello Moores test: Positive  FUNCTIONAL TESTS:  Amb 28f with SPC in L hand - gait training with good carry over of demonstrated/verbalized sequencing Truck transfer: patient demonstrates how he assists RLE into truck with BUE, uses pull of Ues on overhead rails to raise L foot with heavy LLE push up into truck Sit > supine transfer: modI with BUE assisting RLE into bed Supine > sit transfer: modI with increased time and UE pushing needed STS transfer modI with gowers and heavy LLE lean with stand  GAIT: Distance walked: 564fAssistive device utilized: Single point cane Level of assistance: SBA Comments: Amb with current method and with cane in LUE with patient able to demonstrate good carry over of gait with SPC with LUE    [facility]'S TREATMENT[facility]  [facility]  DATE: 08/16/22 Gait Training  Education on [place] in LUE with demo and VC for sequencing pattern with excellent carry over and understanding from pt and wife  PATIENT EDUCATION:  Education details: Patient was educated on diagnosis, anatomy and pathology involved, prognosis, role of PT, and was given an HEP, demonstrating exercise with proper form  following verbal and tactile cues, and was given a paper hand out to continue exercise at home. Pt was educated on and agreed to plan of care.  Gait training with cane in LUE Person educated: Patient and Spouse Education method: Explanation, Demonstration, and Verbal cues Education comprehension: verbalized understanding, returned demonstration, and verbal cues required   HOME EXERCISE PROGRAM: PT reviewed the following HEP with patient with patient able to demonstrate a set of the following with min cuing for correction needed. PT educated patient on parameters of therex (how/when to inc/decrease intensity, frequency, rep/set range, stretch hold time, and purpose of therex) with verbalized understanding.  Thomas stretch 30sec 2-3x/day Hamstring stretch 2-3x/day Figure 4 stretch  2-3x/day  ASSESSMENT:  CLINICAL IMPRESSION: Patient is a 61 y.o. male who was seen today for physical therapy evaluation and treatment for R upper leg pain secondary to non-small cell lung cancer/stage IV-favor adeno carcinoma-metastases to right femur/liver originally diagnosed Dec 2022.Impairments in decreased R hip strength, P/AROM, static balance, motor control, muscle tension/trigger points, and pain. Activity limitations in functional transferring (supine <> sit, STS, car transfers), community and household ambulation, lifting, reaching; inhibiting full participation in self care and community ADLs. Would benefit from skilled PT to address above deficits and promote optimal return to PLOF.    OBJECTIVE IMPAIRMENTS: Abnormal gait, decreased activity tolerance, decreased balance, decreased coordination, decreased endurance, decreased knowledge of use of DME, decreased mobility, difficulty walking, decreased ROM, decreased strength, increased fascial restrictions, increased muscle spasms, impaired flexibility, impaired tone, improper body mechanics, postural dysfunction, and pain.   ACTIVITY LIMITATIONS: carrying,  lifting, sitting, standing, squatting, stairs, transfers, bathing, hygiene/grooming, and locomotion level  PARTICIPATION LIMITATIONS: meal prep, cleaning, laundry, driving, community activity, occupation, and yard work  PERSONAL FACTORS: Age, Education, Fitness, Past/current experiences, and 3+ comorbidities: stage IV  lung cancer met to R femur, HLD, CAD  are also affecting patient's functional outcome.   REHAB POTENTIAL: Fair cancer prognosis  CLINICAL DECISION MAKING: Evolving/moderate complexity  EVALUATION COMPLEXITY: Moderate   GOALS: Goals reviewed with patient? Yes  SHORT TERM GOALS: Target date: 09/18/2022  Pt will be independent with HEP in order to improve strength and balance in order to decrease fall risk and improve function at home and work. Baseline:08/16/22 HEP given  Goal status: INITIAL   LONG TERM GOALS: Target date: 10/16/2022  Patient will increase FOTO score to 79 to demonstrate predicted increase in functional mobility to complete ADLs Baseline: to be completed next visit Goal status: INITIAL  2.  Pt and wife will demonstrate and verbalize understanding for maintenance HEP with 100% accuracy  Baseline: initial HEP given  Goal status: INITIAL  Further goals not written d/t pt prognosis - PT for palliative care   PLAN: PT FREQUENCY: 1-2x/week  PT DURATION: 12 weeks  PLANNED INTERVENTIONS: Therapeutic exercises, Therapeutic activity, Neuromuscular re-education, Balance training, Gait training, Patient/Family education, Self Care, Joint mobilization, Joint manipulation, Dry Needling, Spinal manipulation, Spinal mobilization, Cryotherapy, Moist heat, Traction, Ultrasound, Manual therapy, and Re-evaluation.  PLAN FOR NEXT SESSION: SLS time; 10MWT  Durwin Reges DPT  Durwin Reges, PT 08/21/2022, 8:17 AM

## 2022-08-20 ENCOUNTER — Encounter: Payer: Self-pay | Admitting: Physical Therapy

## 2022-08-20 ENCOUNTER — Ambulatory Visit: Payer: BC Managed Care – PPO | Admitting: Physical Therapy

## 2022-08-22 ENCOUNTER — Inpatient Hospital Stay: Payer: BC Managed Care – PPO

## 2022-08-22 ENCOUNTER — Inpatient Hospital Stay (HOSPITAL_BASED_OUTPATIENT_CLINIC_OR_DEPARTMENT_OTHER): Payer: BC Managed Care – PPO | Admitting: Internal Medicine

## 2022-08-22 ENCOUNTER — Ambulatory Visit: Payer: BC Managed Care – PPO

## 2022-08-22 ENCOUNTER — Encounter: Payer: Self-pay | Admitting: Internal Medicine

## 2022-08-22 VITALS — BP 140/82 | HR 76 | Temp 96.2°F | Resp 16 | Wt 240.0 lb

## 2022-08-22 DIAGNOSIS — C7951 Secondary malignant neoplasm of bone: Secondary | ICD-10-CM | POA: Diagnosis not present

## 2022-08-22 DIAGNOSIS — C3411 Malignant neoplasm of upper lobe, right bronchus or lung: Secondary | ICD-10-CM

## 2022-08-22 DIAGNOSIS — C4442 Squamous cell carcinoma of skin of scalp and neck: Secondary | ICD-10-CM | POA: Diagnosis not present

## 2022-08-22 DIAGNOSIS — Z923 Personal history of irradiation: Secondary | ICD-10-CM | POA: Diagnosis not present

## 2022-08-22 DIAGNOSIS — K219 Gastro-esophageal reflux disease without esophagitis: Secondary | ICD-10-CM | POA: Diagnosis not present

## 2022-08-22 DIAGNOSIS — Z79899 Other long term (current) drug therapy: Secondary | ICD-10-CM | POA: Diagnosis not present

## 2022-08-22 DIAGNOSIS — Z5111 Encounter for antineoplastic chemotherapy: Secondary | ICD-10-CM | POA: Diagnosis not present

## 2022-08-22 DIAGNOSIS — C787 Secondary malignant neoplasm of liver and intrahepatic bile duct: Secondary | ICD-10-CM | POA: Diagnosis not present

## 2022-08-22 LAB — CBC WITH DIFFERENTIAL/PLATELET
Abs Immature Granulocytes: 0.01 10*3/uL (ref 0.00–0.07)
Basophils Absolute: 0 10*3/uL (ref 0.0–0.1)
Basophils Relative: 0 %
Eosinophils Absolute: 0 10*3/uL (ref 0.0–0.5)
Eosinophils Relative: 1 %
HCT: 46.2 % (ref 39.0–52.0)
Hemoglobin: 16 g/dL (ref 13.0–17.0)
Immature Granulocytes: 0 %
Lymphocytes Relative: 16 %
Lymphs Abs: 0.8 10*3/uL (ref 0.7–4.0)
MCH: 31.3 pg (ref 26.0–34.0)
MCHC: 34.6 g/dL (ref 30.0–36.0)
MCV: 90.2 fL (ref 80.0–100.0)
Monocytes Absolute: 0.7 10*3/uL (ref 0.1–1.0)
Monocytes Relative: 14 %
Neutro Abs: 3.5 10*3/uL (ref 1.7–7.7)
Neutrophils Relative %: 69 %
Platelets: 188 10*3/uL (ref 150–400)
RBC: 5.12 MIL/uL (ref 4.22–5.81)
RDW: 13.5 % (ref 11.5–15.5)
WBC: 5.1 10*3/uL (ref 4.0–10.5)
nRBC: 0 % (ref 0.0–0.2)

## 2022-08-22 LAB — COMPREHENSIVE METABOLIC PANEL
ALT: 22 U/L (ref 0–44)
AST: 26 U/L (ref 15–41)
Albumin: 4.2 g/dL (ref 3.5–5.0)
Alkaline Phosphatase: 59 U/L (ref 38–126)
Anion gap: 8 (ref 5–15)
BUN: 16 mg/dL (ref 8–23)
CO2: 21 mmol/L — ABNORMAL LOW (ref 22–32)
Calcium: 9.1 mg/dL (ref 8.9–10.3)
Chloride: 107 mmol/L (ref 98–111)
Creatinine, Ser: 1.04 mg/dL (ref 0.61–1.24)
GFR, Estimated: >60 mL/min (ref >60)
Glucose, Bld: 134 mg/dL — ABNORMAL HIGH (ref 70–99)
Potassium: 3.6 mmol/L (ref 3.5–5.1)
Sodium: 136 mmol/L (ref 135–145)
Total Bilirubin: 0.5 mg/dL (ref 0.3–1.2)
Total Protein: 7.7 g/dL (ref 6.5–8.1)

## 2022-08-22 MED ORDER — SODIUM CHLORIDE 0.9 % IV SOLN
Freq: Once | INTRAVENOUS | Status: AC
  Filled 2022-08-22: qty 250

## 2022-08-22 MED ORDER — SODIUM CHLORIDE 0.9 % IV SOLN
350.0000 mg | Freq: Once | INTRAVENOUS | Status: AC
  Administered 2022-08-22: 350 mg via INTRAVENOUS
  Filled 2022-08-22: qty 7

## 2022-08-22 NOTE — Assessment & Plan Note (Addendum)
#   Non-small cell lung cancer/stage IV-favor adeno carcinoma-metastases to right femur/liver; synchronous squamous cell carcinoma scalp  SEP 25th, 2023- PET scan: Response to therapy noted; no residual activity in the right femoral metadiaphysis bony metastases [however see below regarding worsening pain]; liver metastases improved; no new sites of metastasis noted. Patient currently on maintanence Libatayo.  STABLE; however worsening pain.-See below  # Proceed with Single agent Libtayo maintenance- Labs today reviewed;  acceptable for treatment today.  SEP 2023- TSH-WNL>   # right hip pain-status post radiation [[place]; DEC 2022]-PET-SEP 2023-showed decreased uptake in the right hip; however given worsening pain concerning for progression of disease.  S/p evaluation with Dr. Rebeca Allegra RT on 10/03 x5 Fx. Awaiting  Orthopedic oncology. Evaluation at [facility].  Discussed regarding Zometa infusion. ..-discussed the role of Zometa to decrease skeletal related events [pain; fractures; need for radiation; hypercalcemia].  Discussed the potential side effects including but not limited to-infusion reaction; hypocalcemia; and Osteo-necrosis of jaw. Recommend dental clearance.    # Low vit D- [July 2023- vit D- 25]; on  ergocalciferol- Calcium 9.2- improved/STABLE.  # Squamous cell carcinoma of the scalp-s/p excision; positive margins- on libtayo-no clinical evidence of progression.  Might need to consider radiation-if any local progression noted/also based on course lung cancer.  STABLE.    # GERD- on prilosec recommend PPI BID prior to meals- STABLE.    #Incidental findings on Imaging  PET scan ,SEP 2023: Atherosclerosis COPD; bilateral adenomas; nonobstructing left nephrolithiasis I reviewed/discussed/counseled the patient.   # IV access: PIV  # DISPOSITION: #Libtayo;- treatment  Today. # follow up in 3 weeks- MD;labs- cbc/cmp; Libtayo;possible Zometa- Dr.B

## 2022-08-22 NOTE — Patient Instructions (Signed)
[facility] CANCER CTR AT [place]  Discharge Instructions: Thank you for choosing [facility] to provide your oncology and hematology care.  If you have a lab appointment with the [facility], please go directly to the [facility] and check in at the registration area.  Wear comfortable clothing and clothing appropriate for easy access to any Portacath or PICC line.   We strive to give you quality time with your provider. You may need to reschedule your appointment if you arrive late (15 or more minutes).  Arriving late affects you and other patients whose appointments are after yours.  Also, if you miss three or more appointments without notifying the office, you may be dismissed from the clinic at the provider's discretion.      For prescription refill requests, have your pharmacy contact our office and allow 72 hours for refills to be completed.    Today you received the following chemotherapy and/or immunotherapy agents Cemiplimab.      To help prevent nausea and vomiting after your treatment, we encourage you to take your nausea medication as directed.  BELOW ARE SYMPTOMS THAT SHOULD BE REPORTED IMMEDIATELY: *FEVER GREATER THAN 100.4 F (38 C) OR HIGHER *CHILLS OR SWEATING *NAUSEA AND VOMITING THAT IS NOT CONTROLLED WITH YOUR NAUSEA MEDICATION *UNUSUAL SHORTNESS OF BREATH *UNUSUAL BRUISING OR BLEEDING *URINARY PROBLEMS (pain or burning when urinating, or frequent urination) *BOWEL PROBLEMS (unusual diarrhea, constipation, pain near the anus) TENDERNESS IN MOUTH AND THROAT WITH OR WITHOUT PRESENCE OF ULCERS (sore throat, sores in mouth, or a toothache) UNUSUAL RASH, SWELLING OR PAIN  UNUSUAL VAGINAL DISCHARGE OR ITCHING   Items with * indicate a potential emergency and should be followed up as soon as possible or go to the Emergency Department if any problems should occur.  Please show the CHEMOTHERAPY ALERT CARD or IMMUNOTHERAPY ALERT CARD at check-in to  the Emergency Department and triage nurse.  Should you have questions after your visit or need to cancel or reschedule your appointment, please contact [facility] CANCER [place] AT [place]  [phone] and follow the prompts.  Office hours are 8:00 a.m. to 4:30 p.m. Monday - Friday. Please note that voicemails left after 4:00 p.m. may not be returned until the following business day.  We are closed weekends and major holidays. You have access to a nurse at all times for urgent questions. Please call the main number to the clinic [phone] and follow the prompts.  For any non-urgent questions, you may also contact your provider using MyChart. We now offer e-Visits for anyone 47 and older to request care online for non-urgent symptoms. For details visit [email or web address].   Also download the MyChart app! Go to the app store, search "MyChart", open the app, select [facility], and log in with your MyChart username and password.  Masks are optional in the cancer centers. If you would like for your care team to wear a mask while they are taking care of you, please let them know. For doctor visits, patients may have with them one support person who is at least 61 years old. At this time, visitors are not allowed in the infusion area.

## 2022-08-22 NOTE — Progress Notes (Signed)
Pt in for follow up, reports continued pain in right hip, reports pain an 8 this morning.  Pt reports needs refill on tramadol reports taking every 6 hours. When pt is in pain.  Wife reports that patient has appt at [facility] on Sep 11, 2022 with Orthopedic oncologist Dr Janice Coffin.

## 2022-08-22 NOTE — Progress Notes (Signed)
[facility] CONSULT NOTE  Patient Care Team: Casilda Carls, MD as PCP - General (Internal Medicine) Lorretta Harp, MD as PCP - Cardiology (Cardiology) Curt Bears, MD as Consulting Physician (Oncology) Casilda Carls, MD as Referring Physician (Internal Medicine) Cammie Sickle, MD as Consulting Physician (Internal Medicine) Cammie Sickle, MD as Consulting Physician (Internal Medicine)  CHIEF COMPLAINTS/PURPOSE OF CONSULTATION: lung cancer  #  Oncology History Overview Note  DIAGNOSIS: 1) stage IV (T1c, N0, M1 C) non-small cell lung cancer favoring adenocarcinoma presented with right lung apical nodule in addition to metastatic disease in the right hepatic lobe and the proximal right femoral diaphysis diagnosed and December 2022. 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    QNS; Guardant 360 showed positive KRAS G12C mutation  FINAL MICROSCOPIC DIAGNOSIS:   A. LUNG, RUL, FINE NEEDLE ASPIRATION:  - Malignant cells consistent with non-small cell carcinoma, see comment   B. LUNG, RUL, BRUSHING:  - Malignant cells consistent with non-small cell carcinoma, see comment       COMMENT:   A and B.  Dr. Saralyn Pilar reviewed the case and concurs with the diagnosis.  Only rare malignant cells are present on the cellblock.  Immunohistochemical stains were attempted and show that the tumor cells  have patchy staining for TTF-1 whereas p63, p40 and CK5/6 are negative.  The findings are nondiagnostic but suggestive of a lung adenocarcinoma.  Dr. Lamonte Sakai was notified on 10/13/2021.   SEP-OCT 2022-  [Dermatology]-   Poorly differentiated squamous cell carcinoma; -  Carcinoma extends to the edges of the excision specimen   IMPRESSION: 1. The spiculated nodule at the right lung apex on recent neck CT is hypermetabolic and is concerning for primary bronchogenic  carcinoma. Tissue sampling recommended. 2. Hypermetabolic activity within the occipital scalp and small previously demonstrated right occipital lymph node compatible with known squamous cell carcinoma. Evaluation of the head and neck limited by motion artifact. 3. Hypermetabolic lesions inferiorly in the right hepatic lobe and in the proximal right femoral diaphysis suspicious for metastatic disease, primary uncertain in this patient with a history of prostate cancer. Correlate with PSA levels. Abdominal MRI without and with contrast may be helpful for further characterization of the liver lesion.  # s/p RT tto RUL [[place]]; right hip RT;   # 2007MI-CAD [s/p stent-Dr.Berry; EF 2021- 58%]     Primary adenocarcinoma of upper lobe of right lung ([place])  10/18/2021 Initial Diagnosis   Primary adenocarcinoma of upper lobe of right lung ([place])   10/18/2021 Cancer Staging   Staging form: Lung, AJCC 8th Edition - Clinical: Stage IVB (cT1c, cN0, cM1c) - Signed by Curt Bears, MD on 10/18/2021   11/01/2021 - 05/30/2022 Chemotherapy   Patient is on Treatment Plan : LUNG NSCLC Pemetrexed + Carboplatin q21d x 4 Cycles     11/01/2021 -  Chemotherapy   Patient is on Treatment Plan : LUNG NSCLC Libtayo q  21d       HISTORY OF PRESENTING ILLNESS: Ambulating independently.  Accompanied by his wife.   Chris [name] 61 y.o.  male metastatic stage IV non-small cell lung cancer/favor adenocarcinoma-currently on maintained libatyo here for follow-up-review.   Pt in for follow up, reports continued pain in right hip, reports pain an 8 this morning.  Pt reports needs refill on tramadol reports taking every 6 hours. S/p RT- no improvement. The patient has appt at [facility] on Sep 11, 2022 with Orthopedic oncologist Dr Janice Coffin.    He is currently walking with a cane.  No falls. Denies any fever chills.  No nausea no vomiting.   Review of Systems  Constitutional:  Positive for malaise/fatigue.  Negative for chills, diaphoresis, fever and weight loss.  HENT:  Positive for hearing loss. Negative for nosebleeds and sore throat.   Eyes:  Negative for double vision.  Respiratory:  Negative for cough, hemoptysis, sputum production, shortness of breath and wheezing.   Cardiovascular:  Negative for chest pain, palpitations, orthopnea and leg swelling.  Gastrointestinal:  Negative for abdominal pain, blood in stool, diarrhea, heartburn, melena, nausea and vomiting.  Genitourinary:  Negative for dysuria, frequency and urgency.  Musculoskeletal:  Negative for back pain.  Skin: Negative.  Negative for itching and rash.  Neurological:  Negative for dizziness, tingling, focal weakness, weakness and headaches.  Endo/Heme/Allergies:  Does not bruise/bleed easily.  Psychiatric/Behavioral:  Negative for depression. The patient does not have insomnia.      MEDICAL HISTORY:  Past Medical History:  Diagnosis Date   Anxiety    associated with medical care,  worsened by difficulty hearing, does better with wife present   Complication of anesthesia    wife states very anxious may need pre sedation   Coronary artery disease    GERD (gastroesophageal reflux disease)    Hearing loss    mild per wife   Hearing loss    no hearing aids per wife   Hyperlipidemia    Hypertension    MI (myocardial infarction) ([place]) 04/17/2006   Acute inferolateral wall MI with cardiogenic shock and complete heart block   Primary adenocarcinoma of upper lobe of right lung ([place])    Prostate cancer ([place])    Tobacco abuse    Wears glasses    Wears partial dentures    Upper    SURGICAL HISTORY: Past Surgical History:  Procedure Laterality Date   BRONCHIAL BIOPSY  10/09/2021   Procedure: BRONCHIAL BIOPSIES;  Surgeon: Collene Gobble, MD;  Location: [facility];  Service: Pulmonary;;   BRONCHIAL BRUSHINGS  10/09/2021   Procedure: BRONCHIAL BRUSHINGS;  Surgeon: Collene Gobble, MD;  Location: [facility] ENDOSCOPY;  Service:  Pulmonary;;   BRONCHIAL NEEDLE ASPIRATION BIOPSY  10/09/2021   Procedure: BRONCHIAL NEEDLE ASPIRATION BIOPSIES;  Surgeon: Collene Gobble, MD;  Location: [place] ENDOSCOPY;  Service: Pulmonary;;   CARDIAC CATHETERIZATION  2007   left, RCA 100% occluded ruptured plaque with thrombus in the proximal segment   CYST EXCISION N/A 08/30/2021   Procedure: EXCISION OF POSTERIOR SCALP CYST;  Surgeon: Clovis Riley, MD;  Location: [facility] ORS;  Service: General;  Laterality: N/A;   CYSTOSCOPY N/A 07/17/2018   Procedure: Consuela Mimes;  Surgeon: Franchot Gallo, MD;  Location: [facility];  Service: Urology;  Laterality: N/A;  no seeds in bladder per Dr Diona Fanti   FIDUCIAL MARKER PLACEMENT  10/09/2021   Procedure: FIDUCIAL MARKER PLACEMENT;  Surgeon: Collene Gobble, MD;  Location: [facility] ENDOSCOPY;  Service: Pulmonary;;   HAND SURGERY Right    has metal plate in arm   PROSTATE BIOPSY     RADIOACTIVE SEED IMPLANT N/A 07/17/2018   Procedure: RADIOACTIVE SEED IMPLANT/BRACHYTHERAPY IMPLANT;  Surgeon: Franchot Gallo, MD;  Location: [facility];  Service: Urology;  Laterality: N/A;  77 seeds   SPACE OAR INSTILLATION N/A 07/17/2018   Procedure: SPACE OAR INSTILLATION;  Surgeon: Franchot Gallo, MD;  Location[facility];  Service: Urology;  Laterality:  N/A;   VIDEO BRONCHOSCOPY WITH RADIAL ENDOBRONCHIAL ULTRASOUND  10/09/2021   Procedure: VIDEO BRONCHOSCOPY WITH RADIAL ENDOBRONCHIAL ULTRASOUND;  Surgeon: Collene Gobble, MD;  Location: [facility] ENDOSCOPY;  Service: Pulmonary;;   WRIST SURGERY  10/04/2012    SOCIAL HISTORY: Social History   Socioeconomic History   Marital status: Married    Spouse name: Not on file   Number of children: Not on file   Years of education: Not on file   Highest education level: Not on file  Occupational History   Not on file  Tobacco Use   Smoking status: Every Day    Packs/day: 1.50    Years: 42.00    Total pack years: 63.00     Types: Cigarettes   Smokeless tobacco: Never  Vaping Use   Vaping Use: Never used  Substance and Sexual Activity   Alcohol use: No   Drug use: No   Sexual activity: Yes    Birth control/protection: None  Other Topics Concern   Not on file  Social History Narrative   10-04 19 Unable to ask abuse questions wife with him today.   Social Determinants of Health   Financial Resource Strain: Not on file  Food Insecurity: Not on file  Transportation Needs: Not on file  Physical Activity: Not on file  Stress: Not on file  Social Connections: Not on file  Intimate Partner Violence: Not on file    FAMILY HISTORY: Family History  Problem Relation Age of Onset   Breast cancer Mother    Prostate cancer Neg Hx    Kidney cancer Neg Hx    Cancer Neg Hx     ALLERGIES:  has No Known Allergies.  MEDICATIONS:  Current Outpatient Medications  Medication Sig Dispense Refill   ALPRAZolam (XANAX) 0.25 MG tablet Take 1 tablet (0.25 mg total) by mouth at bedtime. 15 tablet 0   atorvastatin (LIPITOR) 20 MG tablet Take 1 tablet (20 mg total) by mouth daily. 90 tablet 3   clopidogrel (PLAVIX) 75 MG tablet Take 1 tablet (75 mg total) by mouth daily. 90 tablet 3   ergocalciferol (VITAMIN D2) 1.25 MG (50000 UT) capsule Take 1 capsule (50,000 Units total) by mouth once a week. 12 capsule 1   ketoconazole (NIZORAL) 2 % cream Apply 1 application. topically 2 (two) times daily as needed for irritation.     meloxicam (MOBIC) 15 MG tablet Take 1 tablet (15 mg total) by mouth daily. 30 tablet 2   metoprolol tartrate (LOPRESSOR) 25 MG tablet Take 1 tablet (25 mg total) by mouth daily. 90 tablet 3   pantoprazole (PROTONIX) 40 MG tablet Take 1 tablet (40 mg total) by mouth daily. 60 tablet 1   sertraline (ZOLOFT) 100 MG tablet TAKE 1 TABLET BY MOUTH EVERY DAY 90 tablet 0   tamsulosin (FLOMAX) 0.4 MG CAPS capsule Take 1 capsule (0.4 mg total) by mouth daily. 30 capsule 3   traMADol (ULTRAM) 50 MG tablet Take 1  tablet (50 mg total) by mouth every 6 (six) hours as needed. 30 tablet 0   prochlorperazine (COMPAZINE) 10 MG tablet Take 1 tablet (10 mg total) by mouth every 6 (six) hours as needed for nausea or vomiting. (Patient not taking: Reported on 08/01/2022) 30 tablet 1   No current facility-administered medications for this visit.      Marland Kitchen  PHYSICAL EXAMINATION: ECOG PERFORMANCE STATUS: 1 - Symptomatic but completely ambulatory  Vitals:   08/22/22 0928  BP: (!) 140/82  Pulse: 76  Resp: 16  Temp: (!) 96.2 F (35.7 C)   Filed Weights   08/22/22 0927 08/22/22 0928  Weight: 240 lb (108.9 kg) 240 lb (108.9 kg)    Physical Exam Vitals and nursing note reviewed.  HENT:     Head: Normocephalic and atraumatic.     Mouth/Throat:     Pharynx: Oropharynx is clear.  Eyes:     Extraocular Movements: Extraocular movements intact.     Pupils: Pupils are equal, round, and reactive to light.  Cardiovascular:     Rate and Rhythm: Normal rate and regular rhythm.  Pulmonary:     Comments: Decreased breath sounds bilaterally.  Abdominal:     Palpations: Abdomen is soft.  Musculoskeletal:        General: Normal range of motion.     Cervical back: Normal range of motion.  Skin:    General: Skin is warm.  Neurological:     General: No focal deficit present.     Mental Status: He is alert and oriented to person, place, and time.  Psychiatric:        Behavior: Behavior normal.        Judgment: Judgment normal.      LABORATORY DATA:  I have reviewed the data as listed Lab Results  Component Value Date   WBC 5.1 08/22/2022   HGB 16.0 08/22/2022   HCT 46.2 08/22/2022   MCV 90.2 08/22/2022   PLT 188 08/22/2022   Recent Labs    07/11/22 0812 08/01/22 0845 08/22/22 0851  NA 138 139 136  K 3.7 4.0 3.6  CL 110 109 107  CO2 22 23 21*  GLUCOSE 138* 126* 134*  BUN 19 24* 16  CREATININE 0.96 1.10 1.04  CALCIUM 8.8* 9.2 9.1  GFRNONAA >60 >60 >60  PROT 7.2 7.4 7.7  ALBUMIN 4.1 4.2 4.2   AST $Re'20 18 26  'cAw$ ALT $R'19 21 22  'BF$ ALKPHOS 65 71 59  BILITOT 0.7 0.5 0.5    RADIOGRAPHIC STUDIES: I have personally reviewed the radiological images as listed and agreed with the findings in the report. No results found.  ASSESSMENT & PLAN:   Primary adenocarcinoma of upper lobe of right lung ([place]) # Non-small cell lung cancer/stage IV-favor adeno carcinoma-metastases to right femur/liver; synchronous squamous cell carcinoma scalp  SEP 25th, 2023- PET scan: Response to therapy noted; no residual activity in the right femoral metadiaphysis bony metastases [however see below regarding worsening pain]; liver metastases improved; no new sites of metastasis noted. Patient currently on maintanence Libatayo.  STABLE; however worsening pain.-See below  # Proceed with Single agent Libtayo maintenance- Labs today reviewed;  acceptable for treatment today.  SEP 2023- TSH-WNL>   # right hip pain-status post radiation [[place]; DEC 2022]-PET-SEP 2023-showed decreased uptake in the right hip; however given worsening pain concerning for progression of disease.  S/p evaluation with Dr. Rebeca Allegra RT on 10/03 x5 Fx. Awaiting  Orthopedic oncology. Evaluation at [facility]  Discussed regarding Zometa infusion. ..-discussed the role of Zometa to decrease skeletal related events [pain; fractures; need for radiation; hypercalcemia].  Discussed the potential side effects including but not limited to-infusion reaction; hypocalcemia; and Osteo-necrosis of jaw. Recommend dental clearance.    # Low vit D- [July 2023- vit D- 25]; on  ergocalciferol- Calcium 9.2- improved/STABLE.  # Squamous cell carcinoma of the scalp-s/p excision; positive margins- on libtayo-no clinical evidence of progression.  Might need to consider radiation-if any local progression noted/also based on course lung cancer.  STABLE.    #  GERD- on prilosec recommend PPI BID prior to meals- STABLE.    #Incidental findings on Imaging  PET scan ,SEP 2023:  Atherosclerosis COPD; bilateral adenomas; nonobstructing left nephrolithiasis I reviewed/discussed/counseled the patient.   # IV access: PIV  # DISPOSITION: #Libtayo;- treatment  Today. # follow up in 3 weeks- MD;labs- cbc/cmp; Libtayo;possible Zometa- Dr.B                All questions were answered. The patient knows to call the clinic with any problems, questions or concerns.       Cammie Sickle, MD 08/22/2022 9:58 AM

## 2022-08-23 ENCOUNTER — Encounter: Payer: BC Managed Care – PPO | Admitting: Physical Therapy

## 2022-08-23 ENCOUNTER — Ambulatory Visit: Payer: BC Managed Care – PPO | Admitting: Physical Therapy

## 2022-08-23 ENCOUNTER — Other Ambulatory Visit: Payer: Self-pay | Admitting: Hospice and Palliative Medicine

## 2022-08-23 MED ORDER — TRAMADOL HCL 50 MG PO TABS: 50.0000 mg | ORAL_TABLET | Freq: Four times a day (QID) | ORAL | 0 refills | Status: DC | PRN

## 2022-08-28 ENCOUNTER — Encounter: Payer: BC Managed Care – PPO | Admitting: Physical Therapy

## 2022-08-28 DIAGNOSIS — M5416 Radiculopathy, lumbar region: Secondary | ICD-10-CM | POA: Diagnosis not present

## 2022-09-03 DIAGNOSIS — M5126 Other intervertebral disc displacement, lumbar region: Secondary | ICD-10-CM | POA: Diagnosis not present

## 2022-09-03 DIAGNOSIS — M48061 Spinal stenosis, lumbar region without neurogenic claudication: Secondary | ICD-10-CM | POA: Diagnosis not present

## 2022-09-10 DIAGNOSIS — M5416 Radiculopathy, lumbar region: Secondary | ICD-10-CM | POA: Diagnosis not present

## 2022-09-11 ENCOUNTER — Telehealth: Payer: Self-pay | Admitting: Hospice and Palliative Medicine

## 2022-09-11 ENCOUNTER — Encounter: Payer: Self-pay | Admitting: Hospice and Palliative Medicine

## 2022-09-11 ENCOUNTER — Telehealth: Payer: Self-pay | Admitting: Internal Medicine

## 2022-09-11 ENCOUNTER — Ambulatory Visit: Payer: BC Managed Care – PPO | Admitting: Cardiovascular Disease

## 2022-09-11 NOTE — Telephone Encounter (Signed)
Patients wife called to let us know he is having mobility issues and she will not be able to get him here tomorrow.   She would like to change his treatment to next Wednesday. Please advise.   Thank you

## 2022-09-11 NOTE — Telephone Encounter (Signed)
I returned a call to patient's wife.  She reports patient's leg pain has progressively worsened over the past days to weeks.  She has had patient evaluated at [facility] by an orthopedic oncologist and states that they plan to see patient today for stat CT scan of the hip and possible admission to the hospital.  We discussed possible follow-up in our clinic for pain management tomorrow if needed in the event the patient is not admitted.  She will keep [place] posted

## 2022-09-11 NOTE — Telephone Encounter (Signed)
[facility]

## 2022-09-11 NOTE — Telephone Encounter (Signed)
Patient's wife would like for Josh to call her.

## 2022-09-11 NOTE — Telephone Encounter (Signed)
I spoke with patient's wife.  She had him evaluated today at [facility] for worsening right hip pain.  Reportedly, patient is now nonambulatory.  They have previously discussed imaging but given rapidly progressing symptoms, patient was advised to present to the emergency department instead.  Wife states that she plans to take patient to the emergency department and would like to reschedule clinic appointments.

## 2022-09-12 ENCOUNTER — Inpatient Hospital Stay: Payer: BC Managed Care – PPO

## 2022-09-12 ENCOUNTER — Other Ambulatory Visit: Payer: Self-pay

## 2022-09-12 ENCOUNTER — Emergency Department
Admission: EM | Admit: 2022-09-12 | Discharge: 2022-09-12 | Disposition: A | Discharge status: Home / Self Care | Payer: BC Managed Care – PPO | Attending: Emergency Medicine | Admitting: Emergency Medicine

## 2022-09-12 ENCOUNTER — Other Ambulatory Visit: Payer: Self-pay | Admitting: *Deleted

## 2022-09-12 ENCOUNTER — Emergency Department: Payer: BC Managed Care – PPO

## 2022-09-12 ENCOUNTER — Encounter: Payer: Self-pay | Admitting: Occupational Therapy

## 2022-09-12 ENCOUNTER — Encounter: Payer: Self-pay | Admitting: Hospice and Palliative Medicine

## 2022-09-12 ENCOUNTER — Inpatient Hospital Stay: Payer: BC Managed Care – PPO | Admitting: Internal Medicine

## 2022-09-12 DIAGNOSIS — S7291XA Unspecified fracture of right femur, initial encounter for closed fracture: Secondary | ICD-10-CM | POA: Diagnosis not present

## 2022-09-12 DIAGNOSIS — M84451A Pathological fracture, right femur, initial encounter for fracture: Secondary | ICD-10-CM | POA: Insufficient documentation

## 2022-09-12 DIAGNOSIS — S728X1A Other fracture of right femur, initial encounter for closed fracture: Secondary | ICD-10-CM

## 2022-09-12 DIAGNOSIS — S7221XA Displaced subtrochanteric fracture of right femur, initial encounter for closed fracture: Secondary | ICD-10-CM | POA: Diagnosis not present

## 2022-09-12 DIAGNOSIS — R531 Weakness: Secondary | ICD-10-CM

## 2022-09-12 DIAGNOSIS — M25551 Pain in right hip: Secondary | ICD-10-CM

## 2022-09-12 DIAGNOSIS — I2510 Atherosclerotic heart disease of native coronary artery without angina pectoris: Secondary | ICD-10-CM | POA: Diagnosis not present

## 2022-09-12 DIAGNOSIS — R296 Repeated falls: Secondary | ICD-10-CM

## 2022-09-12 DIAGNOSIS — I10 Essential (primary) hypertension: Secondary | ICD-10-CM | POA: Diagnosis not present

## 2022-09-12 LAB — CBC WITH DIFFERENTIAL/PLATELET
Abs Immature Granulocytes: 0.02 10*3/uL (ref 0.00–0.07)
Basophils Absolute: 0 10*3/uL (ref 0.0–0.1)
Basophils Relative: 1 %
Eosinophils Absolute: 0.1 10*3/uL (ref 0.0–0.5)
Eosinophils Relative: 1 %
HCT: 45.9 % (ref 39.0–52.0)
Hemoglobin: 15.8 g/dL (ref 13.0–17.0)
Immature Granulocytes: 0 %
Lymphocytes Relative: 12 %
Lymphs Abs: 0.8 10*3/uL (ref 0.7–4.0)
MCH: 30.7 pg (ref 26.0–34.0)
MCHC: 34.4 g/dL (ref 30.0–36.0)
MCV: 89.1 fL (ref 80.0–100.0)
Monocytes Absolute: 0.8 10*3/uL (ref 0.1–1.0)
Monocytes Relative: 13 %
Neutro Abs: 4.5 10*3/uL (ref 1.7–7.7)
Neutrophils Relative %: 73 %
Platelets: 201 10*3/uL (ref 150–400)
RBC: 5.15 MIL/uL (ref 4.22–5.81)
RDW: 13.5 % (ref 11.5–15.5)
WBC: 6.1 10*3/uL (ref 4.0–10.5)
nRBC: 0 % (ref 0.0–0.2)

## 2022-09-12 LAB — COMPREHENSIVE METABOLIC PANEL
ALT: 24 U/L (ref 0–44)
AST: 23 U/L (ref 15–41)
Albumin: 4.3 g/dL (ref 3.5–5.0)
Alkaline Phosphatase: 61 U/L (ref 38–126)
Anion gap: 10 (ref 5–15)
BUN: 19 mg/dL (ref 8–23)
CO2: 23 mmol/L (ref 22–32)
Calcium: 9.4 mg/dL (ref 8.9–10.3)
Chloride: 107 mmol/L (ref 98–111)
Creatinine, Ser: 1.11 mg/dL (ref 0.61–1.24)
GFR, Estimated: >60 mL/min (ref >60)
Glucose, Bld: 106 mg/dL — ABNORMAL HIGH (ref 70–99)
Potassium: 4.2 mmol/L (ref 3.5–5.1)
Sodium: 140 mmol/L (ref 135–145)
Total Bilirubin: 0.8 mg/dL (ref 0.3–1.2)
Total Protein: 7.6 g/dL (ref 6.5–8.1)

## 2022-09-12 MED ORDER — OXYCODONE-ACETAMINOPHEN 5-325 MG PO TABS: 1.0000 | ORAL_TABLET | Freq: Four times a day (QID) | ORAL | 0 refills | Status: AC | PRN

## 2022-09-12 MED ORDER — MORPHINE SULFATE (PF) 4 MG/ML IV SOLN
4.0000 mg | Freq: Once | INTRAVENOUS | Status: AC
  Administered 2022-09-12: 4 mg via INTRAVENOUS
  Filled 2022-09-12: qty 1

## 2022-09-12 NOTE — Discharge Instructions (Addendum)
-  Please do not bear any weight on the right leg until you can be seen by the orthopedist.  Dr. Madie Reno office should be contacting you within the next 1 to 2 days to schedule an appointment.  Recommend utilizing the crutches to avoid bearing weight on your leg.  -You may use the oxycodone/acetaminophen as needed for pain.  Avoid using this in combination with the tramadol, though you may alternate as needed.  -Return to the emergency department anytime if you begin to experience any new or worsening symptoms.

## 2022-09-12 NOTE — ED Notes (Signed)
[facility] for transfer, took information and will call back [phone]

## 2022-09-12 NOTE — ED Triage Notes (Signed)
C/O right leg pain, worsening over past several days.  Patient has metastatic cancer with tumor to right femur.

## 2022-09-12 NOTE — ED Notes (Signed)
Images powershared to [facility]

## 2022-09-12 NOTE — ED Provider Notes (Signed)
[facility] Provider Note    Event Date/Time   First MD Initiated Contact with Patient 09/12/22 [id number]     (approximate)   History   Chief Complaint Leg Pain   HPI Chris [name] is a 61 y.o. male, history of stage IV non-small cell lung cancer, (metastatic disease in right hepatic lobe and proximal right femoral diaphysis ), CAD, hyperlipidemia, GERD, prior MI, hypertension, anxiety, presents to the emergency department for evaluation of right hip/leg pain x 3 days.  Patient states that he he does have a history of metastatic disease in his right femur, however he has had progressive worsening of his pain over the past couple days.  Denies any recent falls or injuries.  He states that he is normally able to ambulate on his own, however over the past few days he has been unable to get out of bed.  His wife states that she has needed to wheel him in a wheelchair everywhere.  Described as a shooting, sharp pain, particularly worse with any extension of his leg, as well as during any transitions from sitting to standing.  He was originally scheduled for a CT scan this Friday, however they contacted their orthopedic oncologist, Dr. Mylo Red, who advised him to report to the emergency department for a stat CT scan due to the rapid increase in pain.  Patient denies fever/chills, chest pain, shortness of breath, abdominal pain, flank pain, nausea/vomiting, diarrhea, saddle anesthesia, urinary incontinence, bowel dysfunction, rash/lesions, decree sensation in his lower extremities, or dizziness/lightheadedness.  Per records review, patient did have a MRI of the lumbar spine, which did not show any acute fractures, but did show some mild subarticular stenosis bilaterally L4-L5 and moderate subarticular stenosis bilaterally and L5-S1 due to spurring.   History Limitations: No limitations.        Physical Exam  Triage Vital Signs: ED Triage Vitals  Enc Vitals Group     BP  09/12/22 0733 (!) 170/98     Pulse Rate 09/12/22 0733 66     Resp 09/12/22 0733 16     Temp 09/12/22 0733 97.7 F (36.5 C)     Temp Source 09/12/22 0733 Oral     SpO2 09/12/22 0733 95 %     Weight 09/12/22 0730 239 lb 6.7 oz (108.6 kg)     Height 09/12/22 0730 5\' 10"  (1.778 m)     Head Circumference --      Peak Flow --      Pain Score 09/12/22 0730 10     Pain Loc --      Pain Edu? --      Excl. in [place]? --     Most recent vital signs: Vitals:   09/12/22 1147 09/12/22 1452  BP: 135/88 130/88  Pulse: (!) 58 62  Resp: 18 18  Temp: 98.1 F (36.7 C) 98 F (36.7 C)  SpO2: 97% 97%    General: Awake, appears uncomfortable. Skin: Warm, dry. No rashes or lesions.  Eyes: PERRL. Conjunctivae normal.  CV: Good peripheral perfusion.  Resp: Normal effort.  Abd: Soft, non-tender. No distention.  Neuro: At baseline. No gross neurological deficits.  Musculoskeletal: Normal ROM of all extremities.  Focused Exam: No gross deformities to the right lower extremity.  No tenderness along the midline lumbar spine.  He does have some tenderness along the right hip/buttocks region extending into the thigh.  He recoils whenever trying to straighten his right leg.  PMS intact distally.  No skin  rashes or erythema/warmth along the affected regions.  He is unable to bear any weight on the extremity without significant pain.  Physical Exam    ED Results / Procedures / Treatments  Labs (all labs ordered are listed, but only abnormal results are displayed) Labs Reviewed  COMPREHENSIVE METABOLIC PANEL - Abnormal; Notable for the following components:      Result Value   Glucose, Bld 106 (*)    All other components within normal limits  CBC WITH DIFFERENTIAL/PLATELET     EKG Sinus rhythm, rate of 70, RBBB present, no ST segment changes, normal QT length, no significant axis deviations.    RADIOLOGY  ED Provider Interpretation: I personally reviewed and interpreted these images,  longitudinal fracture along the medial cortex of the femur noted on CT.  CT Hip Right Wo Contrast  Result Date: 09/12/2022 CLINICAL DATA:  HIP TRAUMA, FRACTURE SUSPECTED. RECENT ABNORMAL PET SCAN. EXAM: CT OF THE RIGHT HIP WITHOUT CONTRAST, CT OF THE RIGHT FEMUR WITHOUT CONTRAST TECHNIQUE: Multidetector CT imaging of the right hip was performed according to the standard protocol. Multiplanar CT image reconstructions were also generated. RADIATION DOSE REDUCTION: This exam was performed according to the departmental dose-optimization program which includes automated exposure control, adjustment of the mA and/or kV according to patient size and/or use of iterative reconstruction technique. COMPARISON:  MULTIPLE PRIOR IMAGING STUDIES INCLUDING TO PRIOR PET CTS AND RECENT RADIOGRAPHS 07/11/2022 FINDINGS: The right hip is normally located. No stress fracture or AVN. Subchondral cyst noted at the head neck junction region. Subtrochanteric cortical longitudinal fracture involving the medial cortex of the femur possibly a stress fracture related to patient's underlying metastatic lesion and radiation treatment. No through and through fracture is identified. The remainder of the femur is unremarkable. No bone lesions or other fractures. The hip and pelvic musculature is grossly normal. No obvious muscle lesions, muscle tear or intramuscular hematoma. No subcutaneous lesions. No significant intrapelvic abnormalities are identified. Brachytherapy seeds are noted in the prostate gland. No right-sided inguinal or pelvic adenopathy. Stable scattered vascular disease. IMPRESSION: Subtrochanteric cortical longitudinal fracture involving the medial cortex of the femur possibly a stress fracture related to patient's underlying metastatic lesion and radiation treatment. Electronically Signed   By: Marijo Sanes M.D.   On: 09/12/2022 08:57   CT FEMUR RIGHT WO CONTRAST  Result Date: 09/12/2022 CLINICAL DATA:  HIP TRAUMA,  FRACTURE SUSPECTED. RECENT ABNORMAL PET SCAN. EXAM: CT OF THE RIGHT HIP WITHOUT CONTRAST, CT OF THE RIGHT FEMUR WITHOUT CONTRAST TECHNIQUE: Multidetector CT imaging of the right hip was performed according to the standard protocol. Multiplanar CT image reconstructions were also generated. RADIATION DOSE REDUCTION: This exam was performed according to the departmental dose-optimization program which includes automated exposure control, adjustment of the mA and/or kV according to patient size and/or use of iterative reconstruction technique. COMPARISON:  MULTIPLE PRIOR IMAGING STUDIES INCLUDING TO PRIOR PET CTS AND RECENT RADIOGRAPHS 07/11/2022 FINDINGS: The right hip is normally located. No stress fracture or AVN. Subchondral cyst noted at the head neck junction region. Subtrochanteric cortical longitudinal fracture involving the medial cortex of the femur possibly a stress fracture related to patient's underlying metastatic lesion and radiation treatment. No through and through fracture is identified. The remainder of the femur is unremarkable. No bone lesions or other fractures. The hip and pelvic musculature is grossly normal. No obvious muscle lesions, muscle tear or intramuscular hematoma. No subcutaneous lesions. No significant intrapelvic abnormalities are identified. Brachytherapy seeds are noted in the prostate gland. No  right-sided inguinal or pelvic adenopathy. Stable scattered vascular disease. IMPRESSION: Subtrochanteric cortical longitudinal fracture involving the medial cortex of the femur possibly a stress fracture related to patient's underlying metastatic lesion and radiation treatment. Electronically Signed   By: Marijo Sanes M.D.   On: 09/12/2022 08:57    PROCEDURES:  Critical Care performed: N/A  Procedures    MEDICATIONS ORDERED IN ED: Medications  morphine (PF) 4 MG/ML injection 4 mg (4 mg Intravenous Given 09/12/22 0808)     IMPRESSION / MDM / ASSESSMENT AND PLAN / ED COURSE   I reviewed the triage vital signs and the nursing notes.                              Differential diagnosis includes, but is not limited to, lumbar radiculopathy, sciatica, metastatic progression, femur fracture, hip fracture, lumbosacral strain, septic arthritis.  ED Course Patient appears clinically stable, does noticeably in pain and uncomfortable.  Will treat initially with 4 mg morphine IV.  CBC shows no leukocytosis or anemia.  CMP shows no transaminitis, electrolyte abnormalities, or AKI.  Assessment/Plan Patient presents with right-sided hip/thigh pain x3 days.  No recent falls or injuries.  He does have some point tenderness along the proximal femoral region on exam.  His CT scan does show a subtrochanteric cortical longitudinal fracture involving the medial cortex of the femur, possibly a stress fracture related to patient's underlying metastatic lesion and radiation treatment.  Spoke to the on-call orthopedic oncologist at [facility], Dr. Newman Pies, who advised that the patient would likely benefit from surgery, however it is not emergent and can be scheduled outpatient.  She recommended non-weightbearing (no greater than patient's toe) until she can follow-up with him in clinic.  Patient and his wife were amenable to this plan.  Provided patient with crutches and prescription for oxycodone/acetaminophen.  Will discharge.  Considered admission for this patient, but given his stable presentation and access to close follow-up, he is unlikely benefit from admission.  Provided the patient with anticipatory guidance, return precautions, and educational material. Encouraged the patient to return to the emergency department at any time if they begin to experience any new or worsening symptoms. Patient expressed understanding and agreed with the plan.   Patient's presentation is most consistent with acute presentation with potential threat to life or bodily function.       FINAL  CLINICAL IMPRESSION(S) / ED DIAGNOSES   Final diagnoses:  Other closed fracture of right femur, unspecified portion of femur, initial encounter ([place])     Rx / DC Orders   ED Discharge Orders          Ordered    oxyCODONE-acetaminophen (PERCOCET) 5-325 MG tablet  Every 6 hours PRN        09/12/22 1422             Note:  This document was prepared using Dragon voice recognition software and may include unintentional dictation errors.   Teodoro Spray, [place] 09/12/22 1637    Naaman Plummer, MD 09/13/22 [phone]

## 2022-09-13 ENCOUNTER — Encounter: Payer: Self-pay | Admitting: Cardiovascular Disease

## 2022-09-13 ENCOUNTER — Encounter: Payer: Self-pay | Admitting: Internal Medicine

## 2022-09-13 ENCOUNTER — Telehealth: Payer: Self-pay | Admitting: Hospice and Palliative Medicine

## 2022-09-13 NOTE — Telephone Encounter (Signed)
Spoke with patient's wife.  She has been in communication with orthopedic oncologist at [facility] with tentative plan for surgery next Tuesday.  Patient sees them tomorrow for preop.  Wife says that patient's pain has been reasonably well controlled on tramadol and we discussed liberalizing dose to 100 mg every 6 hours if needed.  Additionally, she plans to pick up the Percocet prescribed in the ED to have on hand if needed.

## 2022-09-13 NOTE — Telephone Encounter (Signed)
I spoke with her. Separate telephone note placed.

## 2022-09-14 ENCOUNTER — Telehealth: Payer: Self-pay | Admitting: Cardiovascular Disease

## 2022-09-14 DIAGNOSIS — Z9189 Other specified personal risk factors, not elsewhere classified: Secondary | ICD-10-CM | POA: Diagnosis not present

## 2022-09-14 DIAGNOSIS — C3411 Malignant neoplasm of upper lobe, right bronchus or lung: Secondary | ICD-10-CM | POA: Diagnosis not present

## 2022-09-14 NOTE — Telephone Encounter (Signed)
Spoke with pt's wife regarding upcoming surgery to repair pt's broken femur. Pt was told to stop taking plavix prior to surgery. Wife would just like Dr. Gwenlyn Found to review his chart and make sure that there is nothing else that needs to be done prior to surgery. Surgery is scheduled for Tuesday. Will follow up with wife on Monday. Wife verbalizes understanding.

## 2022-09-14 NOTE — Telephone Encounter (Signed)
Wife called to report patient is having surgery for a broken femur next week and Plavix has been stopped and wanted Dr. Gwenlyn Found to be aware.

## 2022-09-17 NOTE — Telephone Encounter (Signed)
Spoke with pt's wife, [name] (ok per [facility]) regarding pt's upcoming ortho surgery. Per Dr. Gwenlyn Found ok to interrupt plavix for surgery. All questions answered. Wife verbalizes understanding.

## 2022-09-17 NOTE — Telephone Encounter (Signed)
Left detailed message for pt's wife. Instructed to call back with questions or concerns.

## 2022-09-17 NOTE — Telephone Encounter (Signed)
Spoke with pt's wife. Please see phone encounter.

## 2022-09-18 DIAGNOSIS — Z955 Presence of coronary angioplasty implant and graft: Secondary | ICD-10-CM | POA: Diagnosis not present

## 2022-09-18 DIAGNOSIS — M84751A Incomplete atypical femoral fracture, right leg, initial encounter for fracture: Secondary | ICD-10-CM | POA: Diagnosis not present

## 2022-09-18 DIAGNOSIS — F17210 Nicotine dependence, cigarettes, uncomplicated: Secondary | ICD-10-CM | POA: Diagnosis not present

## 2022-09-18 DIAGNOSIS — Z85118 Personal history of other malignant neoplasm of bronchus and lung: Secondary | ICD-10-CM | POA: Diagnosis not present

## 2022-09-18 DIAGNOSIS — I2510 Atherosclerotic heart disease of native coronary artery without angina pectoris: Secondary | ICD-10-CM | POA: Diagnosis not present

## 2022-09-18 DIAGNOSIS — C7951 Secondary malignant neoplasm of bone: Secondary | ICD-10-CM | POA: Diagnosis not present

## 2022-09-18 DIAGNOSIS — S7224XA Nondisplaced subtrochanteric fracture of right femur, initial encounter for closed fracture: Secondary | ICD-10-CM | POA: Diagnosis not present

## 2022-09-18 DIAGNOSIS — Z923 Personal history of irradiation: Secondary | ICD-10-CM | POA: Diagnosis not present

## 2022-09-18 DIAGNOSIS — Z8546 Personal history of malignant neoplasm of prostate: Secondary | ICD-10-CM | POA: Diagnosis not present

## 2022-09-18 DIAGNOSIS — I252 Old myocardial infarction: Secondary | ICD-10-CM | POA: Diagnosis not present

## 2022-09-18 DIAGNOSIS — M84451A Pathological fracture, right femur, initial encounter for fracture: Secondary | ICD-10-CM | POA: Diagnosis not present

## 2022-09-19 DIAGNOSIS — Z8546 Personal history of malignant neoplasm of prostate: Secondary | ICD-10-CM | POA: Diagnosis not present

## 2022-09-19 DIAGNOSIS — I252 Old myocardial infarction: Secondary | ICD-10-CM | POA: Diagnosis not present

## 2022-09-19 DIAGNOSIS — I2510 Atherosclerotic heart disease of native coronary artery without angina pectoris: Secondary | ICD-10-CM | POA: Diagnosis not present

## 2022-09-19 DIAGNOSIS — Z923 Personal history of irradiation: Secondary | ICD-10-CM | POA: Diagnosis not present

## 2022-09-19 DIAGNOSIS — Z85118 Personal history of other malignant neoplasm of bronchus and lung: Secondary | ICD-10-CM | POA: Diagnosis not present

## 2022-09-19 DIAGNOSIS — F17210 Nicotine dependence, cigarettes, uncomplicated: Secondary | ICD-10-CM | POA: Diagnosis not present

## 2022-09-19 DIAGNOSIS — M84751A Incomplete atypical femoral fracture, right leg, initial encounter for fracture: Secondary | ICD-10-CM | POA: Diagnosis not present

## 2022-09-19 DIAGNOSIS — Z955 Presence of coronary angioplasty implant and graft: Secondary | ICD-10-CM | POA: Diagnosis not present

## 2022-09-21 ENCOUNTER — Encounter: Payer: Self-pay | Admitting: Hospice and Palliative Medicine

## 2022-09-21 ENCOUNTER — Encounter: Payer: Self-pay | Admitting: Internal Medicine

## 2022-09-24 ENCOUNTER — Inpatient Hospital Stay: Payer: BC Managed Care – PPO | Admitting: Hospice and Palliative Medicine

## 2022-09-24 NOTE — Telephone Encounter (Signed)
Josh to reach out to pt's wife.

## 2022-09-24 NOTE — Telephone Encounter (Signed)
VM left for patient's wife.

## 2022-09-25 ENCOUNTER — Encounter: Payer: Self-pay | Admitting: Hospice and Palliative Medicine

## 2022-09-25 ENCOUNTER — Other Ambulatory Visit: Payer: Self-pay | Admitting: Internal Medicine

## 2022-09-25 ENCOUNTER — Telehealth: Payer: Self-pay | Admitting: *Deleted

## 2022-09-25 ENCOUNTER — Other Ambulatory Visit: Payer: Self-pay | Admitting: Hospice and Palliative Medicine

## 2022-09-25 DIAGNOSIS — C3411 Malignant neoplasm of upper lobe, right bronchus or lung: Secondary | ICD-10-CM

## 2022-09-25 NOTE — Progress Notes (Signed)
Home health

## 2022-09-25 NOTE — Telephone Encounter (Signed)
Patient/wife was notified via previous MyChart message--Good afternoon.  Wanted to let you guys know that Dr. B would like to cancel Mr. Chris [name]'s appointments for tomorrow (11/29) and r/s to 12/6.  Will this work for you?

## 2022-09-25 NOTE — Telephone Encounter (Signed)
Chris [name]- fyi

## 2022-09-25 NOTE — Telephone Encounter (Signed)
Dr. B would like to cancel appts on 11/29 and r/s to 12/6.

## 2022-09-25 NOTE — Telephone Encounter (Signed)
Patient sent another MyChart message regarding a return call for Chris B.  Message forwarded to [facility] asking if he plans on returning call.

## 2022-09-25 NOTE — Telephone Encounter (Signed)
RN spoke with Corene Cornea at [facility]. Made aware that Josh, NP placed orders for [facility] PT.

## 2022-09-25 NOTE — Telephone Encounter (Signed)
Spoke with patient's wife.  Patient had surgical repair of the hip and is now home.  Wife feels like patient is improving.  We did discuss option of home health to maximize his rehabilitation. Will send home health order for PT.   Pamala Hurry - wife says that they will be able to come in tomorrow for his scheduled appointment.  She says that they were told no chemo until his sutures were removed on 12/8.  Can his appointments be rescheduled and wife notified?

## 2022-09-26 ENCOUNTER — Inpatient Hospital Stay: Payer: BC Managed Care – PPO

## 2022-09-26 ENCOUNTER — Inpatient Hospital Stay: Payer: BC Managed Care – PPO | Admitting: Internal Medicine

## 2022-09-26 ENCOUNTER — Telehealth: Payer: Self-pay | Admitting: *Deleted

## 2022-09-26 ENCOUNTER — Ambulatory Visit: Payer: BC Managed Care – PPO | Admitting: Radiation Oncology

## 2022-09-26 NOTE — Telephone Encounter (Signed)
Per Corene Cornea at [facility]- This patient was referred to [place] from [facility] and we were not in network. He was sent to outpatient for therapy.

## 2022-09-26 NOTE — Progress Notes (Signed)
Spoke to wife regarding recent surgery at [facility].  as planned treatment next. GB

## 2022-09-27 ENCOUNTER — Ambulatory Visit: Payer: BC Managed Care – PPO | Admitting: Radiation Oncology

## 2022-09-28 ENCOUNTER — Encounter: Payer: Self-pay | Admitting: Hospice and Palliative Medicine

## 2022-09-28 ENCOUNTER — Telehealth: Payer: BC Managed Care – PPO | Admitting: Hospice and Palliative Medicine

## 2022-09-28 ENCOUNTER — Telehealth: Payer: Self-pay | Admitting: Hospice and Palliative Medicine

## 2022-09-28 NOTE — Telephone Encounter (Signed)
Left voicemail for wife to call to schedule mychart visit for [facility] homehealth.

## 2022-10-01 ENCOUNTER — Encounter: Payer: Self-pay | Admitting: Hospice and Palliative Medicine

## 2022-10-03 ENCOUNTER — Other Ambulatory Visit: Payer: Self-pay

## 2022-10-03 ENCOUNTER — Inpatient Hospital Stay (HOSPITAL_BASED_OUTPATIENT_CLINIC_OR_DEPARTMENT_OTHER): Payer: BC Managed Care – PPO | Admitting: Hospice and Palliative Medicine

## 2022-10-03 ENCOUNTER — Inpatient Hospital Stay: Payer: BC Managed Care – PPO | Admitting: Internal Medicine

## 2022-10-03 ENCOUNTER — Telehealth: Payer: Self-pay | Admitting: *Deleted

## 2022-10-03 ENCOUNTER — Encounter: Payer: Self-pay | Admitting: Hospice and Palliative Medicine

## 2022-10-03 ENCOUNTER — Other Ambulatory Visit: Payer: Self-pay | Admitting: *Deleted

## 2022-10-03 ENCOUNTER — Inpatient Hospital Stay: Payer: BC Managed Care – PPO

## 2022-10-03 ENCOUNTER — Other Ambulatory Visit: Payer: Self-pay | Admitting: Internal Medicine

## 2022-10-03 ENCOUNTER — Inpatient Hospital Stay: Payer: BC Managed Care – PPO | Attending: Internal Medicine

## 2022-10-03 ENCOUNTER — Inpatient Hospital Stay: Payer: BC Managed Care – PPO | Admitting: Hospice and Palliative Medicine

## 2022-10-03 ENCOUNTER — Telehealth: Payer: Self-pay | Admitting: Internal Medicine

## 2022-10-03 ENCOUNTER — Encounter: Payer: Self-pay | Admitting: Internal Medicine

## 2022-10-03 VITALS — BP 117/66 | HR 75 | Temp 98.5°F | Resp 18 | Ht 70.0 in | Wt 242.8 lb

## 2022-10-03 DIAGNOSIS — C3411 Malignant neoplasm of upper lobe, right bronchus or lung: Secondary | ICD-10-CM

## 2022-10-03 DIAGNOSIS — K219 Gastro-esophageal reflux disease without esophagitis: Secondary | ICD-10-CM | POA: Insufficient documentation

## 2022-10-03 DIAGNOSIS — C787 Secondary malignant neoplasm of liver and intrahepatic bile duct: Secondary | ICD-10-CM | POA: Diagnosis not present

## 2022-10-03 DIAGNOSIS — Z79899 Other long term (current) drug therapy: Secondary | ICD-10-CM | POA: Diagnosis not present

## 2022-10-03 DIAGNOSIS — R531 Weakness: Secondary | ICD-10-CM | POA: Diagnosis not present

## 2022-10-03 DIAGNOSIS — C7951 Secondary malignant neoplasm of bone: Secondary | ICD-10-CM | POA: Diagnosis not present

## 2022-10-03 DIAGNOSIS — C4442 Squamous cell carcinoma of skin of scalp and neck: Secondary | ICD-10-CM | POA: Insufficient documentation

## 2022-10-03 DIAGNOSIS — Z5111 Encounter for antineoplastic chemotherapy: Secondary | ICD-10-CM | POA: Diagnosis not present

## 2022-10-03 LAB — CBC WITH DIFFERENTIAL/PLATELET
Abs Immature Granulocytes: 0.03 10*3/uL (ref 0.00–0.07)
Basophils Absolute: 0 10*3/uL (ref 0.0–0.1)
Basophils Relative: 0 %
Eosinophils Absolute: 0 10*3/uL (ref 0.0–0.5)
Eosinophils Relative: 0 %
HCT: 42.9 % (ref 39.0–52.0)
Hemoglobin: 14.5 g/dL (ref 13.0–17.0)
Immature Granulocytes: 0 %
Lymphocytes Relative: 8 %
Lymphs Abs: 0.8 10*3/uL (ref 0.7–4.0)
MCH: 30.3 pg (ref 26.0–34.0)
MCHC: 33.8 g/dL (ref 30.0–36.0)
MCV: 89.6 fL (ref 80.0–100.0)
Monocytes Absolute: 0.6 10*3/uL (ref 0.1–1.0)
Monocytes Relative: 6 %
Neutro Abs: 8.8 10*3/uL — ABNORMAL HIGH (ref 1.7–7.7)
Neutrophils Relative %: 86 %
Platelets: 289 10*3/uL (ref 150–400)
RBC: 4.79 MIL/uL (ref 4.22–5.81)
RDW: 13.5 % (ref 11.5–15.5)
WBC: 10.2 10*3/uL (ref 4.0–10.5)
nRBC: 0 % (ref 0.0–0.2)

## 2022-10-03 LAB — COMPREHENSIVE METABOLIC PANEL
ALT: 16 U/L (ref 0–44)
AST: 20 U/L (ref 15–41)
Albumin: 4 g/dL (ref 3.5–5.0)
Alkaline Phosphatase: 70 U/L (ref 38–126)
Anion gap: 11 (ref 5–15)
BUN: 16 mg/dL (ref 8–23)
CO2: 23 mmol/L (ref 22–32)
Calcium: 9 mg/dL (ref 8.9–10.3)
Chloride: 102 mmol/L (ref 98–111)
Creatinine, Ser: 1.12 mg/dL (ref 0.61–1.24)
GFR, Estimated: >60 mL/min (ref >60)
Glucose, Bld: 165 mg/dL — ABNORMAL HIGH (ref 70–99)
Potassium: 3.5 mmol/L (ref 3.5–5.1)
Sodium: 136 mmol/L (ref 135–145)
Total Bilirubin: 0.8 mg/dL (ref 0.3–1.2)
Total Protein: 7.4 g/dL (ref 6.5–8.1)

## 2022-10-03 LAB — URINALYSIS, COMPLETE (UACMP) WITH MICROSCOPIC
Bilirubin Urine: NEGATIVE
Glucose, UA: NEGATIVE mg/dL
Hgb urine dipstick: NEGATIVE
Ketones, ur: NEGATIVE mg/dL
Leukocytes,Ua: NEGATIVE
Nitrite: NEGATIVE
Protein, ur: NEGATIVE mg/dL
Specific Gravity, Urine: 1.012 (ref 1.005–1.030)
Squamous Epithelial / HPF: NONE SEEN (ref 0–5)
pH: 5 (ref 5.0–8.0)

## 2022-10-03 LAB — TSH: TSH: 1.654 u[IU]/mL (ref 0.350–4.500)

## 2022-10-03 NOTE — Progress Notes (Signed)
I reached out to patient's wife regarding his clinic visit. Left a voicemail plan follow-up as planned

## 2022-10-03 NOTE — Progress Notes (Signed)
Symptom Management [place] at [facility] Telephone:([phone]) [phone] [phone]  Patient Care Team: Casilda Carls, MD as PCP - General (Internal Medicine) Lorretta Harp, MD as PCP - Cardiology (Cardiology) Curt Bears, MD as Consulting Physician (Oncology) Casilda Carls, MD as Referring Physician (Internal Medicine) Cammie Sickle, MD as Consulting Physician (Internal Medicine) Cammie Sickle, MD as Consulting Physician (Internal Medicine)   NAME OF PATIENT: Chris [name]  [id number]  Nov 17, 1960   DATE OF VISIT: 10/03/22  REASON FOR CONSULT: Chris [name] is a 61 y.o. male with multiple medical problems including stage IV non-small cell lung cancer with metastasis to femur/liver diagnosed in December 2022.  Patient is status post systemic chemotherapy and most recently on maintenance single agent Libtayo. He is status post right hip XRT.  Patient was found to have impending pathologic fracture of the right femur and underwent surgical pinning at [facility] by Ortho oncology.  INTERVAL HISTORY: Patient presents [facility] today for evaluation of fatigue.  He says that he was up much of the night urinating and did not sleep well.  He was scheduled for clinic visit this morning but felt too tired to come in.  He says that he has felt better as the day has progressed.  Additionally, he felt somewhat dizzy and diaphoretic after standing but denies falls.  He says he felt quite thirsty.  He has been trying to drink more water today.  Denies any neurologic complaints. Denies recent fevers or illnesses. Denies any easy bleeding or bruising. Reports fair appetite. Denies shortness of breath or chest pain. Denies any nausea, vomiting, constipation, or diarrhea. Denies urinary complaints. Patient offers no further specific complaints today.   PAST MEDICAL HISTORY: Past Medical History:  Diagnosis Date   Anxiety    associated with medical  care,  worsened by difficulty hearing, does better with wife present   Complication of anesthesia    wife states very anxious may need pre sedation   Coronary artery disease    GERD (gastroesophageal reflux disease)    Hearing loss    mild per wife   Hearing loss    no hearing aids per wife   Hyperlipidemia    Hypertension    MI (myocardial infarction) ([place]) 04/17/2006   Acute inferolateral wall MI with cardiogenic shock and complete heart block   Primary adenocarcinoma of upper lobe of right lung ([place])    Prostate cancer ([place])    Tobacco abuse    Wears glasses    Wears partial dentures    Upper    PAST SURGICAL HISTORY:  Past Surgical History:  Procedure Laterality Date   BRONCHIAL BIOPSY  10/09/2021   Procedure: BRONCHIAL BIOPSIES;  Surgeon: Collene Gobble, MD;  Location: [place] ENDOSCOPY;  Service: Pulmonary;;   BRONCHIAL BRUSHINGS  10/09/2021   Procedure: BRONCHIAL BRUSHINGS;  Surgeon: Collene Gobble, MD;  Location: [facility] ENDOSCOPY;  Service: Pulmonary;;   BRONCHIAL NEEDLE ASPIRATION BIOPSY  10/09/2021   Procedure: BRONCHIAL NEEDLE ASPIRATION BIOPSIES;  Surgeon: Collene Gobble, MD;  Location: [place] ENDOSCOPY;  Service: Pulmonary;;   CARDIAC CATHETERIZATION  2007   left, RCA 100% occluded ruptured plaque with thrombus in the proximal segment   CYST EXCISION N/A 08/30/2021   Procedure: EXCISION OF POSTERIOR SCALP CYST;  Surgeon: Clovis Riley, MD;  Location: [facility] ORS;  Service: General;  Laterality: N/A;   CYSTOSCOPY N/A 07/17/2018   Procedure: Consuela Mimes;  Surgeon: Franchot Gallo, MD;  Location: [place];  Service: Urology;  Laterality: N/A;  no seeds in bladder per Dr Diona Fanti   FIDUCIAL MARKER PLACEMENT  10/09/2021   Procedure: FIDUCIAL MARKER PLACEMENT;  Surgeon: Collene Gobble, MD;  Location: [facility] ENDOSCOPY;  Service: Pulmonary;;   HAND SURGERY Right    has metal plate in arm   PROSTATE BIOPSY     RADIOACTIVE SEED IMPLANT N/A 07/17/2018   Procedure:  RADIOACTIVE SEED IMPLANT/BRACHYTHERAPY IMPLANT;  Surgeon: Franchot Gallo, MD;  Location: [facility];  Service: Urology;  Laterality: N/A;  77 seeds   SPACE OAR INSTILLATION N/A 07/17/2018   Procedure: SPACE OAR INSTILLATION;  Surgeon: Franchot Gallo, MD;  Location: [facility];  Service: Urology;  Laterality: N/A;   VIDEO BRONCHOSCOPY WITH RADIAL ENDOBRONCHIAL ULTRASOUND  10/09/2021   Procedure: VIDEO BRONCHOSCOPY WITH RADIAL ENDOBRONCHIAL ULTRASOUND;  Surgeon: Collene Gobble, MD;  Location: [place] ENDOSCOPY;  Service: Pulmonary;;   WRIST SURGERY  10/04/2012    HEMATOLOGY/ONCOLOGY HISTORY:  Oncology History Overview Note  DIAGNOSIS: 1) stage IV (T1c, N0, M1 C) non-small cell lung cancer favoring adenocarcinoma presented with right lung apical nodule in addition to metastatic disease in the right hepatic lobe and the proximal right femoral diaphysis diagnosed and December 2022. 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    QNS; Guardant 360 showed positive KRAS G12C mutation  FINAL MICROSCOPIC DIAGNOSIS:   A. LUNG, RUL, FINE NEEDLE ASPIRATION:  - Malignant cells consistent with non-small cell carcinoma, see comment   B. LUNG, RUL, BRUSHING:  - Malignant cells consistent with non-small cell carcinoma, see comment       COMMENT:   A and B.  Dr. Saralyn Pilar reviewed the case and concurs with the diagnosis.  Only rare malignant cells are present on the cellblock.  Immunohistochemical stains were attempted and show that the tumor cells  have patchy staining for TTF-1 whereas p63, p40 and CK5/6 are negative.  The findings are nondiagnostic but suggestive of a lung adenocarcinoma.  Dr. Lamonte Sakai was notified on 10/13/2021.   SEP-OCT 2022-  [Dermatology]-   Poorly differentiated squamous cell carcinoma; -  Carcinoma extends to the edges of the excision specimen    IMPRESSION: 1. The spiculated nodule at the right lung apex on recent neck CT is hypermetabolic and is concerning for primary bronchogenic carcinoma. Tissue sampling recommended. 2. Hypermetabolic activity within the occipital scalp and small previously demonstrated right occipital lymph node compatible with known squamous cell carcinoma. Evaluation of the head and neck limited by motion artifact. 3. Hypermetabolic lesions inferiorly in the right hepatic lobe and in the proximal right femoral diaphysis suspicious for metastatic disease, primary uncertain in this patient with a history of prostate cancer. Correlate with PSA levels. Abdominal MRI without and with contrast may be helpful for further characterization of the liver lesion.  # s/p RT tto RUL [[place]]; right hip RT;   # 2007MI-CAD [s/p stent-Dr.Berry; EF 2021- 58%]     Primary adenocarcinoma of upper lobe of right lung ([place])  10/18/2021 Initial Diagnosis   Primary adenocarcinoma of upper lobe of right lung ([place])   10/18/2021 Cancer Staging   Staging form: Lung, AJCC 8th Edition - Clinical: Stage IVB (cT1c, cN0, cM1c) - Signed by Curt Bears, MD on 10/18/2021   11/01/2021 - 05/30/2022 Chemotherapy   Patient is on Treatment Plan : LUNG NSCLC Pemetrexed + Carboplatin q21d x 4 Cycles     11/01/2021 -  Chemotherapy   Patient  is on Treatment Plan : LUNG NSCLC Libtayo q  21d        ALLERGIES:  has No Known Allergies.  MEDICATIONS:  Current Outpatient Medications  Medication Sig Dispense Refill   ALPRAZolam (XANAX) 0.25 MG tablet Take 1 tablet (0.25 mg total) by mouth at bedtime. 15 tablet 0   aspirin EC 81 MG tablet Take 81 mg by mouth daily.     atorvastatin (LIPITOR) 20 MG tablet Take 1 tablet (20 mg total) by mouth daily. 90 tablet 3   clopidogrel (PLAVIX) 75 MG tablet Take 1 tablet (75 mg total) by mouth daily. 90 tablet 3   ergocalciferol (VITAMIN D2) 1.25 MG (50000 UT) capsule Take 1 capsule (50,000 Units total)  by mouth once a week. 12 capsule 1   ketoconazole (NIZORAL) 2 % cream Apply 1 application. topically 2 (two) times daily as needed for irritation.     meloxicam (MOBIC) 15 MG tablet Take 1 tablet (15 mg total) by mouth daily. 30 tablet 2   metoprolol tartrate (LOPRESSOR) 25 MG tablet Take 1 tablet (25 mg total) by mouth daily. 90 tablet 3   pantoprazole (PROTONIX) 40 MG tablet Take 1 tablet (40 mg total) by mouth daily. 60 tablet 1   prochlorperazine (COMPAZINE) 10 MG tablet Take 1 tablet (10 mg total) by mouth every 6 (six) hours as needed for nausea or vomiting. 30 tablet 1   sennosides-docusate sodium (SENOKOT-S) 8.6-50 MG tablet Take 1 tablet by mouth daily.     sertraline (ZOLOFT) 100 MG tablet TAKE 1 TABLET BY MOUTH EVERY DAY 90 tablet 0   Sodium Borate POWD Take 1 tablet by mouth daily at 12 noon.     tamsulosin (FLOMAX) 0.4 MG CAPS capsule Take 1 capsule (0.4 mg total) by mouth daily. 30 capsule 3   traMADol (ULTRAM) 50 MG tablet Take 1 tablet (50 mg total) by mouth every 6 (six) hours as needed. 30 tablet 0   Vitamin D, Ergocalciferol, 50000 units CAPS Take 1 capsule by mouth once a week.     No current facility-administered medications for this visit.    VITAL SIGNS: BP 117/66   Pulse 75   Temp 98.5 F (36.9 C) (Tympanic)   Resp 18   Ht _0  (1.778 m)   Wt 242 lb 12.8 oz (110.1 kg)   SpO2 96%   BMI 34.84 kg/m  Filed Weights   10/03/22 1319  Weight: 242 lb 12.8 oz (110.1 kg)    Estimated body mass index is 34.84 kg/m as calculated from the following:   Height as of this encounter: _1  (1.778 m).   Weight as of this encounter: 242 lb 12.8 oz (110.1 kg).  LABS: CBC:    Component Value Date/Time   WBC 10.2 10/03/2022 1241   HGB 14.5 10/03/2022 1241   HGB 16.4 10/31/2021 1107   HCT 42.9 10/03/2022 1241   PLT 289 10/03/2022 1241   PLT 217 10/31/2021 1107   MCV 89.6 10/03/2022 1241   NEUTROABS 8.8 (H) 10/03/2022 1241   LYMPHSABS 0.8 10/03/2022 1241   MONOABS  0.6 10/03/2022 1241   EOSABS 0.0 10/03/2022 1241   BASOSABS 0.0 10/03/2022 1241   Comprehensive Metabolic Panel:    Component Value Date/Time   NA 136 10/03/2022 1241   K 3.5 10/03/2022 1241   CL 102 10/03/2022 1241   CO2 23 10/03/2022 1241   BUN 16 10/03/2022 1241   CREATININE 1.12 10/03/2022 1241   CREATININE 1.08 10/31/2021 1107  GLUCOSE 165 (H) 10/03/2022 1241   CALCIUM 9.0 10/03/2022 1241   AST 20 10/03/2022 1241   AST 15 10/31/2021 1107   ALT 16 10/03/2022 1241   ALT 17 10/31/2021 1107   ALKPHOS 70 10/03/2022 1241   BILITOT 0.8 10/03/2022 1241   BILITOT 0.7 10/31/2021 1107   PROT 7.4 10/03/2022 1241   ALBUMIN 4.0 10/03/2022 1241    RADIOGRAPHIC STUDIES: CT Hip Right Wo Contrast  Result Date: 09/12/2022 CLINICAL DATA:  HIP TRAUMA, FRACTURE SUSPECTED. RECENT ABNORMAL PET SCAN. EXAM: CT OF THE RIGHT HIP WITHOUT CONTRAST, CT OF THE RIGHT FEMUR WITHOUT CONTRAST TECHNIQUE: Multidetector CT imaging of the right hip was performed according to the standard protocol. Multiplanar CT image reconstructions were also generated. RADIATION DOSE REDUCTION: This exam was performed according to the departmental dose-optimization program which includes automated exposure control, adjustment of the mA and/or kV according to patient size and/or use of iterative reconstruction technique. COMPARISON:  MULTIPLE PRIOR IMAGING STUDIES INCLUDING TO PRIOR PET CTS AND RECENT RADIOGRAPHS 07/11/2022 FINDINGS: The right hip is normally located. No stress fracture or AVN. Subchondral cyst noted at the head neck junction region. Subtrochanteric cortical longitudinal fracture involving the medial cortex of the femur possibly a stress fracture related to patient's underlying metastatic lesion and radiation treatment. No through and through fracture is identified. The remainder of the femur is unremarkable. No bone lesions or other fractures. The hip and pelvic musculature is grossly normal. No obvious muscle  lesions, muscle tear or intramuscular hematoma. No subcutaneous lesions. No significant intrapelvic abnormalities are identified. Brachytherapy seeds are noted in the prostate gland. No right-sided inguinal or pelvic adenopathy. Stable scattered vascular disease. IMPRESSION: Subtrochanteric cortical longitudinal fracture involving the medial cortex of the femur possibly a stress fracture related to patient's underlying metastatic lesion and radiation treatment. Electronically Signed   By: Marijo Sanes M.D.   On: 09/12/2022 08:57   CT FEMUR RIGHT WO CONTRAST  Result Date: 09/12/2022 CLINICAL DATA:  HIP TRAUMA, FRACTURE SUSPECTED. RECENT ABNORMAL PET SCAN. EXAM: CT OF THE RIGHT HIP WITHOUT CONTRAST, CT OF THE RIGHT FEMUR WITHOUT CONTRAST TECHNIQUE: Multidetector CT imaging of the right hip was performed according to the standard protocol. Multiplanar CT image reconstructions were also generated. RADIATION DOSE REDUCTION: This exam was performed according to the departmental dose-optimization program which includes automated exposure control, adjustment of the mA and/or kV according to patient size and/or use of iterative reconstruction technique. COMPARISON:  MULTIPLE PRIOR IMAGING STUDIES INCLUDING TO PRIOR PET CTS AND RECENT RADIOGRAPHS 07/11/2022 FINDINGS: The right hip is normally located. No stress fracture or AVN. Subchondral cyst noted at the head neck junction region. Subtrochanteric cortical longitudinal fracture involving the medial cortex of the femur possibly a stress fracture related to patient's underlying metastatic lesion and radiation treatment. No through and through fracture is identified. The remainder of the femur is unremarkable. No bone lesions or other fractures. The hip and pelvic musculature is grossly normal. No obvious muscle lesions, muscle tear or intramuscular hematoma. No subcutaneous lesions. No significant intrapelvic abnormalities are identified. Brachytherapy seeds are noted  in the prostate gland. No right-sided inguinal or pelvic adenopathy. Stable scattered vascular disease. IMPRESSION: Subtrochanteric cortical longitudinal fracture involving the medial cortex of the femur possibly a stress fracture related to patient's underlying metastatic lesion and radiation treatment. Electronically Signed   By: Marijo Sanes M.D.   On: 09/12/2022 08:57    PERFORMANCE STATUS (ECOG) : 2 - Symptomatic, <50% confined to bed  Review of Systems  Unless otherwise noted, a complete review of systems is negative.  Physical Exam General: NAD Cardiovascular: regular rate and rhythm Pulmonary: clear ant fields Abdomen: soft, nontender, + bowel sounds GU: no suprapubic tenderness Extremities: no edema, no joint deformities Skin: no rashes Neurological: Weakness but otherwise nonfocal  IMPRESSION/PLAN: Weakness -patient appears to be improving following recent surgery.  He is ambulatory with use of a walker but still quite deconditioned.  Will refer to home health PT (discussed with home health liaison).  Labs reviewed and are unremarkable.  Exam is benign today.  Offered supportive care with IV fluids but patient declined.  He is anxious to restart chemotherapy so we will schedule follow-up visit next week with Dr. Rogue Bussing.  Case and plan discussed with Dr. Rogue Bussing   Patient expressed understanding and was in agreement with this plan. He also understands that He can call clinic at any time with any questions, concerns, or complaints.   Thank you for allowing me to participate in the care of this very pleasant patient.   Time Total: 20 minutes  Visit consisted of counseling and education dealing with the complex and emotionally intense issues of symptom management in the setting of serious illness.Greater than 50%  of this time was spent counseling and coordinating care related to the above assessment and plan.  Signed by: Altha Harm, PhD, NP-C

## 2022-10-03 NOTE — Progress Notes (Unsigned)
[facility] CONSULT NOTE  Patient Care Team: Casilda Carls, MD as PCP - General (Internal Medicine) Lorretta Harp, MD as PCP - Cardiology (Cardiology) Curt Bears, MD as Consulting Physician (Oncology) Casilda Carls, MD as Referring Physician (Internal Medicine) Cammie Sickle, MD as Consulting Physician (Internal Medicine) Cammie Sickle, MD as Consulting Physician (Internal Medicine)  CHIEF COMPLAINTS/PURPOSE OF CONSULTATION: lung cancer  #  Oncology History Overview Note  DIAGNOSIS: 1) stage IV (T1c, N0, M1 C) non-small cell lung cancer favoring adenocarcinoma presented with right lung apical nodule in addition to metastatic disease in the right hepatic lobe and the proximal right femoral diaphysis diagnosed and December 2022. 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    QNS; Guardant 360 showed positive KRAS G12C mutation  FINAL MICROSCOPIC DIAGNOSIS:   A. LUNG, RUL, FINE NEEDLE ASPIRATION:  - Malignant cells consistent with non-small cell carcinoma, see comment   B. LUNG, RUL, BRUSHING:  - Malignant cells consistent with non-small cell carcinoma, see comment       COMMENT:   A and B.  Dr. Saralyn Pilar reviewed the case and concurs with the diagnosis.  Only rare malignant cells are present on the cellblock.  Immunohistochemical stains were attempted and show that the tumor cells  have patchy staining for TTF-1 whereas p63, p40 and CK5/6 are negative.  The findings are nondiagnostic but suggestive of a lung adenocarcinoma.  Dr. Lamonte Sakai was notified on 10/13/2021.   SEP-OCT 2022-  [Dermatology]-   Poorly differentiated squamous cell carcinoma; -  Carcinoma extends to the edges of the excision specimen   IMPRESSION: 1. The spiculated nodule at the right lung apex on recent neck CT is hypermetabolic and is concerning for primary bronchogenic  carcinoma. Tissue sampling recommended. 2. Hypermetabolic activity within the occipital scalp and small previously demonstrated right occipital lymph node compatible with known squamous cell carcinoma. Evaluation of the head and neck limited by motion artifact. 3. Hypermetabolic lesions inferiorly in the right hepatic lobe and in the proximal right femoral diaphysis suspicious for metastatic disease, primary uncertain in this patient with a history of prostate cancer. Correlate with PSA levels. Abdominal MRI without and with contrast may be helpful for further characterization of the liver lesion.  # s/p RT tto RUL [[place]]; right hip RT;   # 2007MI-CAD [s/p stent-Dr.Berry; EF 2021- 58%]     Primary adenocarcinoma of upper lobe of right lung ([place])  10/18/2021 Initial Diagnosis   Primary adenocarcinoma of upper lobe of right lung ([place])   10/18/2021 Cancer Staging   Staging form: Lung, AJCC 8th Edition - Clinical: Stage IVB (cT1c, cN0, cM1c) - Signed by Curt Bears, MD on 10/18/2021   11/01/2021 - 05/30/2022 Chemotherapy   Patient is on Treatment Plan : LUNG NSCLC Pemetrexed + Carboplatin q21d x 4 Cycles     11/01/2021 -  Chemotherapy   Patient is on Treatment Plan : LUNG NSCLC Libtayo q  21d       HISTORY OF PRESENTING ILLNESS: Ambulating independently.  Accompanied by his wife.   Chris [name] 61 y.o.  male metastatic stage IV non-small cell lung cancer/favor adenocarcinoma-currently on maintained libatyo here for follow-up-review.   In the interim patient was admitted to [facility] for elective surgery of his right hip for impending fracture second of metastatic disease.  Patient underwent surgery recovering well...    He is currently walking with a cane.  No falls. Denies any  fever chills.  No nausea no vomiting.   Review of Systems  Constitutional:  Positive for malaise/fatigue. Negative for chills, diaphoresis, fever and weight loss.  HENT:  Positive for hearing loss.  Negative for nosebleeds and sore throat.   Eyes:  Negative for double vision.  Respiratory:  Negative for cough, hemoptysis, sputum production, shortness of breath and wheezing.   Cardiovascular:  Negative for chest pain, palpitations, orthopnea and leg swelling.  Gastrointestinal:  Negative for abdominal pain, blood in stool, diarrhea, heartburn, melena, nausea and vomiting.  Genitourinary:  Negative for dysuria, frequency and urgency.  Musculoskeletal:  Negative for back pain.  Skin: Negative.  Negative for itching and rash.  Neurological:  Negative for dizziness, tingling, focal weakness, weakness and headaches.  Endo/Heme/Allergies:  Does not bruise/bleed easily.  Psychiatric/Behavioral:  Negative for depression. The patient does not have insomnia.      MEDICAL HISTORY:  Past Medical History:  Diagnosis Date   Anxiety    associated with medical care,  worsened by difficulty hearing, does better with wife present   Complication of anesthesia    wife states very anxious may need pre sedation   Coronary artery disease    GERD (gastroesophageal reflux disease)    Hearing loss    mild per wife   Hearing loss    no hearing aids per wife   Hyperlipidemia    Hypertension    MI (myocardial infarction) ([place]) 04/17/2006   Acute inferolateral wall MI with cardiogenic shock and complete heart block   Primary adenocarcinoma of upper lobe of right lung ([place])    Prostate cancer ([place])    Tobacco abuse    Wears glasses    Wears partial dentures    Upper    SURGICAL HISTORY: Past Surgical History:  Procedure Laterality Date   BRONCHIAL BIOPSY  10/09/2021   Procedure: BRONCHIAL BIOPSIES;  Surgeon: Collene Gobble, MD;  Location: [place];  Service: Pulmonary;;   BRONCHIAL BRUSHINGS  10/09/2021   Procedure: BRONCHIAL BRUSHINGS;  Surgeon: Collene Gobble, MD;  Location: [facility] END[facility]COPY;  Service: Pulmonary;;   BRONCHIAL NEEDLE ASPIRATION BIOPSY  10/09/2021   Procedure: BRONCHIAL NEEDLE  ASPIRATION BIOPSIES;  Surgeon: Collene Gobble, MD;  Location: [place] ENDOSCOPY;  Service: Pulmonary;;   CARDIAC CATHETERIZATION  2007   left, RCA 100% occluded ruptured plaque with thrombus in the proximal segment   CYST EXCISION N/A 08/30/2021   Procedure: EXCISION OF POSTERIOR SCALP CYST;  Surgeon: Clovis Riley, MD;  Location: [facility];  Service: General;  Laterality: N/A;   CYSTOSCOPY N/A 07/17/2018   Procedure: Consuela Mimes;  Surgeon: Franchot Gallo, MD;  Location: [facility];  Service: Urology;  Laterality: N/A;  no seeds in bladder per Dr Diona Fanti   FIDUCIAL MARKER PLACEMENT  10/09/2021   Procedure: FIDUCIAL MARKER PLACEMENT;  Surgeon: Collene Gobble, MD;  Location: [facility] ENDOSCOPY;  Service: Pulmonary;;   HAND SURGERY Right    has metal plate in arm   PROSTATE BIOPSY     RADIOACTIVE SEED IMPLANT N/A 07/17/2018   Procedure: RADIOACTIVE SEED IMPLANT/BRACHYTHERAPY IMPLANT;  Surgeon: Franchot Gallo, MD;  Location[facility];  Service: Urology;  Laterality: N/A;  77 seeds   SPACE OAR INSTILLATION N/A 07/17/2018   Procedure: SPACE OAR INSTILLATION;  Surgeon: Franchot Gallo, MD;  Location[facility];  Service: Urology;  Laterality: N/A;   VIDEO BRONCHOSCOPY WITH RADIAL ENDOBRONCHIAL ULTRASOUND  10/09/2021   Procedure: VIDEO BRONCHOSCOPY WITH RADIAL ENDOBRONCHIAL ULTRASOUND;  Surgeon:  Collene Gobble, MD;  Location: [facility] ENDOSCOPY;  Service: Pulmonary;;   WRIST SURGERY  10/04/2012    SOCIAL HISTORY: Social History   Socioeconomic History   Marital status: Married    Spouse name: Not on file   Number of children: Not on file   Years of education: Not on file   Highest education level: Not on file  Occupational History   Not on file  Tobacco Use   Smoking status: Every Day    Packs/day: 1.50    Years: 42.00    Total pack years: 63.00    Types: Cigarettes   Smokeless tobacco: Never  Vaping Use   Vaping Use: Never used   Substance and Sexual Activity   Alcohol use: No   Drug use: No   Sexual activity: Yes    Birth control/protection: None  Other Topics Concern   Not on file  Social History Narrative   10[birth year] Unable to ask abuse questions wife with him today.   Social Determinants of Health   Financial Resource Strain: Not on file  Food Insecurity: Not on file  Transportation Needs: Not on file  Physical Activity: Not on file  Stress: Not on file  Social Connections: Not on file  Intimate Partner Violence: Not on file    FAMILY HISTORY: Family History  Problem Relation Age of Onset   Breast cancer Mother    Prostate cancer Neg Hx    Kidney cancer Neg Hx    Cancer Neg Hx     ALLERGIES:  has No Known Allergies.  MEDICATIONS:  Current Outpatient Medications  Medication Sig Dispense Refill   ALPRAZolam (XANAX) 0.25 MG tablet Take 1 tablet (0.25 mg total) by mouth at bedtime. 15 tablet 0   atorvastatin (LIPITOR) 20 MG tablet Take 1 tablet (20 mg total) by mouth daily. 90 tablet 3   clopidogrel (PLAVIX) 75 MG tablet Take 1 tablet (75 mg total) by mouth daily. 90 tablet 3   ergocalciferol (VITAMIN D2) 1.25 MG (50000 UT) capsule Take 1 capsule (50,000 Units total) by mouth once a week. 12 capsule 1   ketoconazole (NIZORAL) 2 % cream Apply 1 application. topically 2 (two) times daily as needed for irritation.     meloxicam (MOBIC) 15 MG tablet Take 1 tablet (15 mg total) by mouth daily. 30 tablet 2   metoprolol tartrate (LOPRESSOR) 25 MG tablet Take 1 tablet (25 mg total) by mouth daily. 90 tablet 3   pantoprazole (PROTONIX) 40 MG tablet Take 1 tablet (40 mg total) by mouth daily. 60 tablet 1   prochlorperazine (COMPAZINE) 10 MG tablet Take 1 tablet (10 mg total) by mouth every 6 (six) hours as needed for nausea or vomiting. (Patient not taking: Reported on 08/01/2022) 30 tablet 1   sertraline (ZOLOFT) 100 MG tablet TAKE 1 TABLET BY MOUTH EVERY DAY 90 tablet 0   tamsulosin (FLOMAX) 0.4 MG CAPS  capsule Take 1 capsule (0.4 mg total) by mouth daily. 30 capsule 3   traMADol (ULTRAM) 50 MG tablet Take 1 tablet (50 mg total) by mouth every 6 (six) hours as needed. 30 tablet 0   No current facility-administered medications for this visit.      Marland Kitchen  PHYSICAL EXAMINATION: ECOG PERFORMANCE STATUS: 1 - Symptomatic but completely ambulatory  There were no vitals filed for this visit.  There were no vitals filed for this visit.   Physical Exam Vitals and nursing note reviewed.  HENT:     Head: Normocephalic and  atraumatic.     Mouth/Throat:     Pharynx: Oropharynx is clear.  Eyes:     Extraocular Movements: Extraocular movements intact.     Pupils: Pupils are equal, round, and reactive to light.  Cardiovascular:     Rate and Rhythm: Normal rate and regular rhythm.  Pulmonary:     Comments: Decreased breath sounds bilaterally.  Abdominal:     Palpations: Abdomen is soft.  Musculoskeletal:        General: Normal range of motion.     Cervical back: Normal range of motion.  Skin:    General: Skin is warm.  Neurological:     General: No focal deficit present.     Mental Status: He is alert and oriented to person, place, and time.  Psychiatric:        Behavior: Behavior normal.        Judgment: Judgment normal.      LABORATORY DATA:  I have reviewed the data as listed Lab Results  Component Value Date   WBC 6.1 09/12/2022   HGB 15.8 09/12/2022   HCT 45.9 09/12/2022   MCV 89.1 09/12/2022   PLT 201 09/12/2022   Recent Labs    08/01/22 0845 08/22/22 0851 09/12/22 0812  NA 139 136 140  K 4.0 3.6 4.2  CL 109 107 107  CO2 23 21* 23  GLUCOSE 126* 134* 106*  BUN 24* 16 19  CREATININE 1.10 1.04 1.11  CALCIUM 9.2 9.1 9.4  GFRNONAA >60 >60 >60  PROT 7.4 7.7 7.6  ALBUMIN 4.2 4.2 4.3  AST _0 ALT _1 ALKPHOS 71 59 61  BILITOT 0.5 0.5 0.8     RADIOGRAPHIC STUDIES: I have personally reviewed the radiological images as listed and agreed with the  findings in the report. CT Hip Right Wo Contrast  Result Date: 09/12/2022 CLINICAL DATA:  HIP TRAUMA, FRACTURE SUSPECTED. RECENT ABNORMAL PET SCAN. EXAM: CT OF THE RIGHT HIP WITHOUT CONTRAST, CT OF THE RIGHT FEMUR WITHOUT CONTRAST TECHNIQUE: Multidetector CT imaging of the right hip was performed according to the standard protocol. Multiplanar CT image reconstructions were also generated. RADIATION DOSE REDUCTION: This exam was performed according to the departmental dose-optimization program which includes automated exposure control, adjustment of the mA and/or kV according to patient size and/or use of iterative reconstruction technique. COMPARISON:  MULTIPLE PRIOR IMAGING STUDIES INCLUDING TO PRIOR PET CTS AND RECENT RADIOGRAPHS 07/11/2022 FINDINGS: The right hip is normally located. No stress fracture or AVN. Subchondral cyst noted at the head neck junction region. Subtrochanteric cortical longitudinal fracture involving the medial cortex of the femur possibly a stress fracture related to patient's underlying metastatic lesion and radiation treatment. No through and through fracture is identified. The remainder of the femur is unremarkable. No bone lesions or other fractures. The hip and pelvic musculature is grossly normal. No obvious muscle lesions, muscle tear or intramuscular hematoma. No subcutaneous lesions. No significant intrapelvic abnormalities are identified. Brachytherapy seeds are noted in the prostate gland. No right-sided inguinal or pelvic adenopathy. Stable scattered vascular disease. IMPRESSION: Subtrochanteric cortical longitudinal fracture involving the medial cortex of the femur possibly a stress fracture related to patient's underlying metastatic lesion and radiation treatment. Electronically Signed   By: Marijo Sanes M.D.   On: 09/12/2022 08:57   CT FEMUR RIGHT WO CONTRAST  Result Date: 09/12/2022 CLINICAL DATA:  HIP TRAUMA, FRACTURE SUSPECTED. RECENT ABNORMAL PET SCAN. EXAM: CT  OF THE RIGHT HIP WITHOUT CONTRAST, CT OF  THE RIGHT FEMUR WITHOUT CONTRAST TECHNIQUE: Multidetector CT imaging of the right hip was performed according to the standard protocol. Multiplanar CT image reconstructions were also generated. RADIATION DOSE REDUCTION: This exam was performed according to the departmental dose-optimization program which includes automated exposure control, adjustment of the mA and/or kV according to patient size and/or use of iterative reconstruction technique. COMPARISON:  MULTIPLE PRIOR IMAGING STUDIES INCLUDING TO PRIOR PET CTS AND RECENT RADIOGRAPHS 07/11/2022 FINDINGS: The right hip is normally located. No stress fracture or AVN. Subchondral cyst noted at the head neck junction region. Subtrochanteric cortical longitudinal fracture involving the medial cortex of the femur possibly a stress fracture related to patient's underlying metastatic lesion and radiation treatment. No through and through fracture is identified. The remainder of the femur is unremarkable. No bone lesions or other fractures. The hip and pelvic musculature is grossly normal. No obvious muscle lesions, muscle tear or intramuscular hematoma. No subcutaneous lesions. No significant intrapelvic abnormalities are identified. Brachytherapy seeds are noted in the prostate gland. No right-sided inguinal or pelvic adenopathy. Stable scattered vascular disease. IMPRESSION: Subtrochanteric cortical longitudinal fracture involving the medial cortex of the femur possibly a stress fracture related to patient's underlying metastatic lesion and radiation treatment. Electronically Signed   By: Marijo Sanes M.D.   On: 09/12/2022 08:57    ASSESSMENT & PLAN:   Primary adenocarcinoma of upper lobe of right lung ([place]) # Non-small cell lung cancer/stage IV-favor adeno carcinoma-metastases to right femur/liver; synchronous squamous cell carcinoma scalp  SEP 25th, 2023- PET scan: Response to therapy noted; no residual activity in the  right femoral metadiaphysis bony metastases [however see below regarding worsening pain]; liver metastases improved; no new sites of metastasis noted. Patient currently on maintanence Libatayo.  STABLE; however worsening pain.-See below  # Proceed with Single agent Libtayo maintenance- Labs today reviewed;  acceptable for treatment today.  SEP 2023- TSH-WNL>   # Impending right hip fracture right hip pain-status post radiation [[place]; DEC 2022]-PET-SEP 2023-showed decreased uptake in the right hip; however given worsening pain concerning for progression of disease.  S/p  RT on 10/03 x5 Fx. NOV 2023-status post intramedullary nail fixation due to an impending femur fracture secondary to metastatic disease to bone.  Discussed the role of Zometa to decrease skeletal related events [pain; fractures; need for radiation; hypercalcemia].  Discussed the potential side effects including but not limited to-infusion reaction; hypocalcemia; and Osteo-necrosis of jaw. Recommend dental clearance.    # Low vit D- [July 2023- vit D- 25]; on  ergocalciferol- Calcium 9.2- improved/STABLE.  # Squamous cell carcinoma of the scalp-s/p excision; positive margins- on libtayo-no clinical evidence of progression.  Might need to consider radiation-if any local progression noted/also based on course lung cancer.  STABLE.    # GERD- on prilosec recommend PPI BID prior to meals- STABLE.     # IV access: PIV  # DISPOSITION: #Libtayo;- treatment  Today. # follow up in 3 weeks- MD;labs- cbc/cmp; Libtayo;possible Zometa- Dr.B                 All questions were answered. The patient knows to call the clinic with any problems, questions or concerns.       Cammie Sickle, MD 10/03/2022 8:32 AM

## 2022-10-03 NOTE — Telephone Encounter (Signed)
Discussed care with patient's wife. Patient's wife reports that patient increased in urination last evening; pt did not rest because he was up/down all night long. Wife does not know if this is contributing factor to patient's fatigue.  Patient c/o of generalized weakness. Oxygen sats in lower 90%. HR 104 this morning when patient tried to ambulation w/his walker (note: recent hip surgery). Patient was "also very sweaty." Patient became dizzy and laid back down. Patient is now resting the per wife the HR went back down to the 80's. Pt is now better and more coherent. Wife feels like pt may be able to make the visit today if r/s this afternoon. "He is drinking water and now walking fine now with this walker. The hip incisions look good and no visible signs of infection. I feels like my husband just needs IV fluids and he will get better."   Per josh- np would like to do a virtual visit with smc assessment this morning to determine the pt's stability. Wife is agreeable to plan. Discussed w/pt's wife. That pt is at risk for infection and blood loss given his recent hip surgery. Wife is very concerned about pt's blood counts and would still like to r/s apts with Dr. B this afternoon for lab check and possible Iv fluids. She doesn't know if pt will be up to the immunotherapy. I explained to her that we can assess the patient with the apt this morning with Josh to determine if [name] feels that a higher level of acuity is needed with ER. If patient is stable, then we can discuss r/s pt's apts. Wife gave verbal understanding of the plan

## 2022-10-03 NOTE — Telephone Encounter (Signed)
There is another phone note addressing patient concerns.

## 2022-10-03 NOTE — Telephone Encounter (Signed)
  Pt wife called. She sent a [employer]. She stated that pt was weak and pale and could not get out of bed today. She has concerns and still wants to bring him in today  but asked if it could be a later appt. She would like a nurse to give her a call back so she can discuss his symptoms at the moment.

## 2022-10-03 NOTE — Assessment & Plan Note (Signed)
#   Non-small cell lung cancer/stage IV-favor adeno carcinoma-metastases to right femur/liver; synchronous squamous cell carcinoma scalp  SEP 25th, 2023- PET scan: Response to therapy noted; no residual activity in the right femoral metadiaphysis bony metastases [however see below regarding worsening pain]; liver metastases improved; no new sites of metastasis noted. Patient currently on maintanence Libatayo.  STABLE; however worsening pain.-See below  # Proceed with Single agent Libtayo maintenance- Labs today reviewed;  acceptable for treatment today.  SEP 2023- TSH-WNL>   # Impending right hip fracture right hip pain-status post radiation [[place]; DEC 2022]-PET-SEP 2023-showed decreased uptake in the right hip; however given worsening pain concerning for progression of disease.  S/p  RT on 10/03 x5 Fx. NOV 2023-status post intramedullary nail fixation due to an impending femur fracture secondary to metastatic disease to bone.  Discussed the role of Zometa to decrease skeletal related events [pain; fractures; need for radiation; hypercalcemia].  Discussed the potential side effects including but not limited to-infusion reaction; hypocalcemia; and Osteo-necrosis of jaw. Recommend dental clearance.    # Low vit D- [July 2023- vit D- 25]; on  ergocalciferol- Calcium 9.2- improved/STABLE.  # Squamous cell carcinoma of the scalp-s/p excision; positive margins- on libtayo-no clinical evidence of progression.  Might need to consider radiation-if any local progression noted/also based on course lung cancer.  STABLE.    # GERD- on prilosec recommend PPI BID prior to meals- STABLE.     # IV access: PIV  # DISPOSITION: #Libtayo;- treatment  Today. # follow up in 3 weeks- MD;labs- cbc/cmp; Libtayo;possible Zometa- Dr.B

## 2022-10-03 NOTE — Telephone Encounter (Signed)
Patient wife called reporting that patient awoke very weak this morning and she is unable to get him out of bed. She is asking if his appts can be moved to later today. Please return her call

## 2022-10-04 LAB — URINE CULTURE: Culture: NO GROWTH

## 2022-10-05 ENCOUNTER — Other Ambulatory Visit: Payer: Self-pay | Admitting: *Deleted

## 2022-10-05 DIAGNOSIS — M79651 Pain in right thigh: Secondary | ICD-10-CM | POA: Diagnosis not present

## 2022-10-05 DIAGNOSIS — X58XXXD Exposure to other specified factors, subsequent encounter: Secondary | ICD-10-CM | POA: Diagnosis not present

## 2022-10-05 DIAGNOSIS — R29898 Other symptoms and signs involving the musculoskeletal system: Secondary | ICD-10-CM | POA: Diagnosis not present

## 2022-10-05 DIAGNOSIS — S79001D Unspecified physeal fracture of upper end of right femur, subsequent encounter for fracture with routine healing: Secondary | ICD-10-CM | POA: Diagnosis not present

## 2022-10-05 DIAGNOSIS — Z4789 Encounter for other orthopedic aftercare: Secondary | ICD-10-CM | POA: Diagnosis not present

## 2022-10-05 MED ORDER — ALPRAZOLAM 0.25 MG PO TABS: 0.2500 mg | ORAL_TABLET | Freq: Every day | ORAL | 0 refills | Status: DC

## 2022-10-05 NOTE — Telephone Encounter (Signed)
incoming pharmacy request for pt's alprazolam script.

## 2022-10-06 DIAGNOSIS — C7951 Secondary malignant neoplasm of bone: Secondary | ICD-10-CM | POA: Diagnosis not present

## 2022-10-06 DIAGNOSIS — C3491 Malignant neoplasm of unspecified part of right bronchus or lung: Secondary | ICD-10-CM | POA: Diagnosis not present

## 2022-10-06 DIAGNOSIS — Z791 Long term (current) use of non-steroidal anti-inflammatories (NSAID): Secondary | ICD-10-CM | POA: Diagnosis not present

## 2022-10-06 DIAGNOSIS — R531 Weakness: Secondary | ICD-10-CM | POA: Diagnosis not present

## 2022-10-06 DIAGNOSIS — I10 Essential (primary) hypertension: Secondary | ICD-10-CM | POA: Diagnosis not present

## 2022-10-06 DIAGNOSIS — Z7902 Long term (current) use of antithrombotics/antiplatelets: Secondary | ICD-10-CM | POA: Diagnosis not present

## 2022-10-06 DIAGNOSIS — I2510 Atherosclerotic heart disease of native coronary artery without angina pectoris: Secondary | ICD-10-CM | POA: Diagnosis not present

## 2022-10-06 DIAGNOSIS — M84551D Pathological fracture in neoplastic disease, right femur, subsequent encounter for fracture with routine healing: Secondary | ICD-10-CM | POA: Diagnosis not present

## 2022-10-06 DIAGNOSIS — Z9181 History of falling: Secondary | ICD-10-CM | POA: Diagnosis not present

## 2022-10-06 DIAGNOSIS — C787 Secondary malignant neoplasm of liver and intrahepatic bile duct: Secondary | ICD-10-CM | POA: Diagnosis not present

## 2022-10-06 DIAGNOSIS — E785 Hyperlipidemia, unspecified: Secondary | ICD-10-CM | POA: Diagnosis not present

## 2022-10-06 DIAGNOSIS — Z85828 Personal history of other malignant neoplasm of skin: Secondary | ICD-10-CM | POA: Diagnosis not present

## 2022-10-06 DIAGNOSIS — K219 Gastro-esophageal reflux disease without esophagitis: Secondary | ICD-10-CM | POA: Diagnosis not present

## 2022-10-06 DIAGNOSIS — Z955 Presence of coronary angioplasty implant and graft: Secondary | ICD-10-CM | POA: Diagnosis not present

## 2022-10-06 DIAGNOSIS — I252 Old myocardial infarction: Secondary | ICD-10-CM | POA: Diagnosis not present

## 2022-10-06 DIAGNOSIS — F419 Anxiety disorder, unspecified: Secondary | ICD-10-CM | POA: Diagnosis not present

## 2022-10-08 ENCOUNTER — Encounter: Payer: Self-pay | Admitting: Internal Medicine

## 2022-10-08 ENCOUNTER — Other Ambulatory Visit: Payer: Self-pay | Admitting: *Deleted

## 2022-10-08 MED ORDER — MELOXICAM 15 MG PO TABS: 15.0000 mg | ORAL_TABLET | Freq: Every day | ORAL | 2 refills | Status: DC

## 2022-10-09 DIAGNOSIS — Z955 Presence of coronary angioplasty implant and graft: Secondary | ICD-10-CM | POA: Diagnosis not present

## 2022-10-09 DIAGNOSIS — K219 Gastro-esophageal reflux disease without esophagitis: Secondary | ICD-10-CM | POA: Diagnosis not present

## 2022-10-09 DIAGNOSIS — Z791 Long term (current) use of non-steroidal anti-inflammatories (NSAID): Secondary | ICD-10-CM | POA: Diagnosis not present

## 2022-10-09 DIAGNOSIS — Z85828 Personal history of other malignant neoplasm of skin: Secondary | ICD-10-CM | POA: Diagnosis not present

## 2022-10-09 DIAGNOSIS — I2510 Atherosclerotic heart disease of native coronary artery without angina pectoris: Secondary | ICD-10-CM | POA: Diagnosis not present

## 2022-10-09 DIAGNOSIS — F419 Anxiety disorder, unspecified: Secondary | ICD-10-CM | POA: Diagnosis not present

## 2022-10-09 DIAGNOSIS — C787 Secondary malignant neoplasm of liver and intrahepatic bile duct: Secondary | ICD-10-CM | POA: Diagnosis not present

## 2022-10-09 DIAGNOSIS — I10 Essential (primary) hypertension: Secondary | ICD-10-CM | POA: Diagnosis not present

## 2022-10-09 DIAGNOSIS — M84551D Pathological fracture in neoplastic disease, right femur, subsequent encounter for fracture with routine healing: Secondary | ICD-10-CM | POA: Diagnosis not present

## 2022-10-09 DIAGNOSIS — Z7902 Long term (current) use of antithrombotics/antiplatelets: Secondary | ICD-10-CM | POA: Diagnosis not present

## 2022-10-09 DIAGNOSIS — C7951 Secondary malignant neoplasm of bone: Secondary | ICD-10-CM | POA: Diagnosis not present

## 2022-10-09 DIAGNOSIS — Z9181 History of falling: Secondary | ICD-10-CM | POA: Diagnosis not present

## 2022-10-09 DIAGNOSIS — C3491 Malignant neoplasm of unspecified part of right bronchus or lung: Secondary | ICD-10-CM | POA: Diagnosis not present

## 2022-10-09 DIAGNOSIS — R531 Weakness: Secondary | ICD-10-CM | POA: Diagnosis not present

## 2022-10-09 DIAGNOSIS — E785 Hyperlipidemia, unspecified: Secondary | ICD-10-CM | POA: Diagnosis not present

## 2022-10-09 DIAGNOSIS — I252 Old myocardial infarction: Secondary | ICD-10-CM | POA: Diagnosis not present

## 2022-10-10 ENCOUNTER — Inpatient Hospital Stay: Payer: BC Managed Care – PPO

## 2022-10-10 ENCOUNTER — Inpatient Hospital Stay (HOSPITAL_BASED_OUTPATIENT_CLINIC_OR_DEPARTMENT_OTHER): Payer: BC Managed Care – PPO | Admitting: Internal Medicine

## 2022-10-10 ENCOUNTER — Encounter: Payer: Self-pay | Admitting: Internal Medicine

## 2022-10-10 VITALS — BP 124/68 | HR 64 | Temp 98.3°F | Resp 18 | Ht 70.0 in | Wt 230.6 lb

## 2022-10-10 DIAGNOSIS — Z79899 Other long term (current) drug therapy: Secondary | ICD-10-CM | POA: Diagnosis not present

## 2022-10-10 DIAGNOSIS — C3411 Malignant neoplasm of upper lobe, right bronchus or lung: Secondary | ICD-10-CM

## 2022-10-10 DIAGNOSIS — C787 Secondary malignant neoplasm of liver and intrahepatic bile duct: Secondary | ICD-10-CM | POA: Diagnosis not present

## 2022-10-10 DIAGNOSIS — K219 Gastro-esophageal reflux disease without esophagitis: Secondary | ICD-10-CM | POA: Diagnosis not present

## 2022-10-10 DIAGNOSIS — C7951 Secondary malignant neoplasm of bone: Secondary | ICD-10-CM | POA: Diagnosis not present

## 2022-10-10 DIAGNOSIS — C4442 Squamous cell carcinoma of skin of scalp and neck: Secondary | ICD-10-CM | POA: Diagnosis not present

## 2022-10-10 DIAGNOSIS — Z5111 Encounter for antineoplastic chemotherapy: Secondary | ICD-10-CM | POA: Diagnosis not present

## 2022-10-10 LAB — COMPREHENSIVE METABOLIC PANEL
ALT: 17 U/L (ref 0–44)
AST: 22 U/L (ref 15–41)
Albumin: 3.9 g/dL (ref 3.5–5.0)
Alkaline Phosphatase: 67 U/L (ref 38–126)
Anion gap: 9 (ref 5–15)
BUN: 21 mg/dL (ref 8–23)
CO2: 24 mmol/L (ref 22–32)
Calcium: 8.9 mg/dL (ref 8.9–10.3)
Chloride: 106 mmol/L (ref 98–111)
Creatinine, Ser: 1.06 mg/dL (ref 0.61–1.24)
GFR, Estimated: >60 mL/min (ref >60)
Glucose, Bld: 130 mg/dL — ABNORMAL HIGH (ref 70–99)
Potassium: 3.9 mmol/L (ref 3.5–5.1)
Sodium: 139 mmol/L (ref 135–145)
Total Bilirubin: 0.6 mg/dL (ref 0.3–1.2)
Total Protein: 7.2 g/dL (ref 6.5–8.1)

## 2022-10-10 LAB — CBC WITH DIFFERENTIAL/PLATELET
Abs Immature Granulocytes: 0.02 10*3/uL (ref 0.00–0.07)
Basophils Absolute: 0 10*3/uL (ref 0.0–0.1)
Basophils Relative: 0 %
Eosinophils Absolute: 0.1 10*3/uL (ref 0.0–0.5)
Eosinophils Relative: 1 %
HCT: 40.3 % (ref 39.0–52.0)
Hemoglobin: 13.4 g/dL (ref 13.0–17.0)
Immature Granulocytes: 0 %
Lymphocytes Relative: 17 %
Lymphs Abs: 0.8 10*3/uL (ref 0.7–4.0)
MCH: 30.1 pg (ref 26.0–34.0)
MCHC: 33.3 g/dL (ref 30.0–36.0)
MCV: 90.6 fL (ref 80.0–100.0)
Monocytes Absolute: 0.6 10*3/uL (ref 0.1–1.0)
Monocytes Relative: 13 %
Neutro Abs: 3.3 10*3/uL (ref 1.7–7.7)
Neutrophils Relative %: 69 %
Platelets: 245 10*3/uL (ref 150–400)
RBC: 4.45 MIL/uL (ref 4.22–5.81)
RDW: 13.7 % (ref 11.5–15.5)
WBC: 4.8 10*3/uL (ref 4.0–10.5)
nRBC: 0 % (ref 0.0–0.2)

## 2022-10-10 MED ORDER — SODIUM CHLORIDE 0.9 % IV SOLN
Freq: Once | INTRAVENOUS | Status: AC
  Filled 2022-10-10: qty 250

## 2022-10-10 MED ORDER — SODIUM CHLORIDE 0.9 % IV SOLN
350.0000 mg | Freq: Once | INTRAVENOUS | Status: AC
  Administered 2022-10-10: 350 mg via INTRAVENOUS
  Filled 2022-10-10: qty 7

## 2022-10-10 NOTE — Progress Notes (Signed)
Patient denies new problems/concerns today.   °

## 2022-10-10 NOTE — Progress Notes (Signed)
[place] CONSULT NOTE  Patient Care Team: Casilda Carls, MD as PCP - General (Internal Medicine) Lorretta Harp, MD as PCP - Cardiology (Cardiology) Curt Bears, MD as Consulting Physician (Oncology) Casilda Carls, MD as Referring Physician (Internal Medicine) Cammie Sickle, MD as Consulting Physician (Internal Medicine) Cammie Sickle, MD as Consulting Physician (Internal Medicine)  CHIEF COMPLAINTS/PURPOSE OF CONSULTATION: lung cancer  #  Oncology History Overview Note  DIAGNOSIS: 1) stage IV (T1c, N0, M1 C) non-small cell lung cancer favoring adenocarcinoma presented with right lung apical nodule in addition to metastatic disease in the right hepatic lobe and the proximal right femoral diaphysis diagnosed and December 2022. 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    QNS; Guardant 360 showed positive KRAS G12C mutation  FINAL MICROSCOPIC DIAGNOSIS:   A. LUNG, RUL, FINE NEEDLE ASPIRATION:  - Malignant cells consistent with non-small cell carcinoma, see comment   B. LUNG, RUL, BRUSHING:  - Malignant cells consistent with non-small cell carcinoma, see comment       COMMENT:   A and B.  Dr. Saralyn Pilar reviewed the case and concurs with the diagnosis.  Only rare malignant cells are present on the cellblock.  Immunohistochemical stains were attempted and show that the tumor cells  have patchy staining for TTF-1 whereas p63, p40 and CK5/6 are negative.  The findings are nondiagnostic but suggestive of a lung adenocarcinoma.  Dr. Lamonte Sakai was notified on 10/13/2021.   SEP-OCT 2022-  [Dermatology]-   Poorly differentiated squamous cell carcinoma; -  Carcinoma extends to the edges of the excision specimen   IMPRESSION: 1. The spiculated nodule at the right lung apex on recent neck CT is hypermetabolic and is concerning for primary bronchogenic  carcinoma. Tissue sampling recommended. 2. Hypermetabolic activity within the occipital scalp and small previously demonstrated right occipital lymph node compatible with known squamous cell carcinoma. Evaluation of the head and neck limited by motion artifact. 3. Hypermetabolic lesions inferiorly in the right hepatic lobe and in the proximal right femoral diaphysis suspicious for metastatic disease, primary uncertain in this patient with a history of prostate cancer. Correlate with PSA levels. Abdominal MRI without and with contrast may be helpful for further characterization of the liver lesion.  # s/p RT tto RUL [[place]]; right hip RT;   # 2007MI-CAD [s/p stent-Dr.Berry; EF 2021- 58%]     Primary adenocarcinoma of upper lobe of right lung ([place])  10/18/2021 Initial Diagnosis   Primary adenocarcinoma of upper lobe of right lung ([place])   10/18/2021 Cancer Staging   Staging form: Lung, AJCC 8th Edition - Clinical: Stage IVB (cT1c, cN0, cM1c) - Signed by Curt Bears, MD on 10/18/2021   11/01/2021 - 05/30/2022 Chemotherapy   Patient is on Treatment Plan : LUNG NSCLC Pemetrexed + Carboplatin q21d x 4 Cycles     11/01/2021 -  Chemotherapy   Patient is on Treatment Plan : LUNG NSCLC Libtayo q  21d       HISTORY OF PRESENTING ILLNESS: Ambulating with a cane.  Accompanied by his wife.   [name] 61 y.o.  male metastatic stage IV non-small cell lung cancer/favor adenocarcinoma-currently on maintained libatyo here for follow-up-review.   In the interim patient was admitted to [facility] for elective surgery of his right hip for impending fracture second of metastatic disease.  Patient underwent surgery recovering well.  On tramodol 50 mg once a day; on PT.  He is currently walking with a cane.  No falls. Denies any fever chills.  No nausea no vomiting.   Review of Systems  Constitutional:  Positive for malaise/fatigue. Negative for chills, diaphoresis, fever and weight loss.   HENT:  Positive for hearing loss. Negative for nosebleeds and sore throat.   Eyes:  Negative for double vision.  Respiratory:  Negative for cough, hemoptysis, sputum production, shortness of breath and wheezing.   Cardiovascular:  Negative for chest pain, palpitations, orthopnea and leg swelling.  Gastrointestinal:  Negative for abdominal pain, blood in stool, diarrhea, heartburn, melena, nausea and vomiting.  Genitourinary:  Negative for dysuria, frequency and urgency.  Musculoskeletal:  Negative for back pain.  Skin: Negative.  Negative for itching and rash.  Neurological:  Negative for dizziness, tingling, focal weakness, weakness and headaches.  Endo/Heme/Allergies:  Does not bruise/bleed easily.  Psychiatric/Behavioral:  Negative for depression. The patient does not have insomnia.      MEDICAL HISTORY:  Past Medical History:  Diagnosis Date   Anxiety    associated with medical care,  worsened by difficulty hearing, does better with wife present   Complication of anesthesia    wife states very anxious may need pre sedation   Coronary artery disease    GERD (gastroesophageal reflux disease)    Hearing loss    mild per wife   Hearing loss    no hearing aids per wife   Hyperlipidemia    Hypertension    MI (myocardial infarction) ([place]) 04/17/2006   Acute inferolateral wall MI with cardiogenic shock and complete heart block   Primary adenocarcinoma of upper lobe of right lung ([place])    Prostate cancer ([place])    Tobacco abuse    Wears glasses    Wears partial dentures    Upper    SURGICAL HISTORY: Past Surgical History:  Procedure Laterality Date   BRONCHIAL BIOPSY  10/09/2021   Procedure: BRONCHIAL BIOPSIES;  Surgeon: Collene Gobble, MD;  Location: [place];  Service: Pulmonary;;   BRONCHIAL BRUSHINGS  10/09/2021   Procedure: BRONCHIAL BRUSHINGS;  Surgeon: Collene Gobble, MD;  Location: [facility] ENDOSCOPY;  Service: Pulmonary;;   BRONCHIAL NEEDLE ASPIRATION BIOPSY   10/09/2021   Procedure: BRONCHIAL NEEDLE ASPIRATION BIOPSIES;  Surgeon: Collene Gobble, MD;  Location: [place] ENDOSCOPY;  Service: Pulmonary;;   CARDIAC CATHETERIZATION  2007   left, RCA 100% occluded ruptured plaque with thrombus in the proximal segment   CYST EXCISION N/A 08/30/2021   Procedure: EXCISION OF POSTERIOR SCALP CYST;  Surgeon: Clovis Riley, MD;  Location: [facility];  Service: General;  Laterality: N/A;   CYSTOSCOPY N/A 07/17/2018   Procedure: Consuela Mimes;  Surgeon: Franchot Gallo, MD;  Location: [facility];  Service: Urology;  Laterality: N/A;  no seeds in bladder per Dr Diona Fanti   FIDUCIAL MARKER PLACEMENT  10/09/2021   Procedure: FIDUCIAL MARKER PLACEMENT;  Surgeon: Collene Gobble, MD;  Location[facility] ENDOSCOPY;  Service: Pulmonary;;   HAND SURGERY Right    has metal plate in arm   PROSTATE BIOPSY     RADIOACTIVE SEED IMPLANT N/A 07/17/2018   Procedure: RADIOACTIVE SEED IMPLANT/BRACHYTHERAPY IMPLANT;  Surgeon: Franchot Gallo, MD;  Location: [facility];  Service: Urology;  Laterality: N/A;  77 seeds   SPACE OAR INSTILLATION N/A 07/17/2018   Procedure: SPACE OAR INSTILLATION;  Surgeon: Franchot Gallo, MD;  Location[facility];  Service: Urology;  Laterality: N/A;   VIDEO BRONCHOSCOPY WITH RADIAL ENDOBRONCHIAL ULTRASOUND  10/09/2021   Procedure: VIDEO BRONCHOSCOPY WITH RADIAL ENDOBRONCHIAL ULTRASOUND;  Surgeon: Collene Gobble, MD;  Location: [facility] ENDOSCOPY;  Service: Pulmonary;;   WRIST SURGERY  10/04/2012    SOCIAL HISTORY: Social History   Socioeconomic History   Marital status: Married    Spouse name: Not on file   Number of children: Not on file   Years of education: Not on file   Highest education level: Not on file  Occupational History   Not on file  Tobacco Use   Smoking status: Every Day    Packs/day: 1.50    Years: 42.00    Total pack years: 63.00    Types: Cigarettes   Smokeless tobacco: Never   Vaping Use   Vaping Use: Never used  Substance and Sexual Activity   Alcohol use: No   Drug use: No   Sexual activity: Yes    Birth control/protection: None  Other Topics Concern   Not on file  Social History Narrative   10[birth year]04 19 Unable to ask abuse questions wife with him today.   Social Determinants of Health   Financial Resource Strain: Not on file  Food Insecurity: Not on file  Transportation Needs: Not on file  Physical Activity: Not on file  Stress: Not on file  Social Connections: Not on file  Intimate Partner Violence: Not on file    FAMILY HISTORY: Family History  Problem Relation Age of Onset   Breast cancer Mother    Prostate cancer Neg Hx    Kidney cancer Neg Hx    Cancer Neg Hx     ALLERGIES:  has No Known Allergies.  MEDICATIONS:  Current Outpatient Medications  Medication Sig Dispense Refill   ALPRAZolam (XANAX) 0.25 MG tablet Take 1 tablet (0.25 mg total) by mouth at bedtime. 15 tablet 0   aspirin EC 81 MG tablet Take 81 mg by mouth daily.     atorvastatin (LIPITOR) 20 MG tablet Take 1 tablet (20 mg total) by mouth daily. 90 tablet 3   clopidogrel (PLAVIX) 75 MG tablet Take 1 tablet (75 mg total) by mouth daily. 90 tablet 3   ergocalciferol (VITAMIN D2) 1.25 MG (50000 UT) capsule Take 1 capsule (50,000 Units total) by mouth once a week. 12 capsule 1   ketoconazole (NIZORAL) 2 % cream Apply 1 application. topically 2 (two) times daily as needed for irritation.     meloxicam (MOBIC) 15 MG tablet Take 1 tablet (15 mg total) by mouth daily. 30 tablet 2   metoprolol tartrate (LOPRESSOR) 25 MG tablet Take 1 tablet (25 mg total) by mouth daily. 90 tablet 3   pantoprazole (PROTONIX) 40 MG tablet Take 1 tablet (40 mg total) by mouth daily. 60 tablet 1   prochlorperazine (COMPAZINE) 10 MG tablet Take 1 tablet (10 mg total) by mouth every 6 (six) hours as needed for nausea or vomiting. 30 tablet 1   sennosides-docusate sodium (SENOKOT-S) 8.6-50 MG tablet Take 1  tablet by mouth daily.     sertraline (ZOLOFT) 100 MG tablet TAKE 1 TABLET BY MOUTH EVERY DAY 90 tablet 0   Sodium Borate POWD Take 1 tablet by mouth daily at 12 noon.     tamsulosin (FLOMAX) 0.4 MG CAPS capsule Take 1 capsule (0.4 mg total) by mouth daily. 30 capsule 3   traMADol (ULTRAM) 50 MG tablet Take 1 tablet (50 mg total) by mouth every 6 (six) hours as needed. 30 tablet 0   Vitamin D, Ergocalciferol, 50000 units CAPS Take 1  capsule by mouth once a week.     No current facility-administered medications for this visit.      Marland Kitchen  PHYSICAL EXAMINATION: ECOG PERFORMANCE STATUS: 1 - Symptomatic but completely ambulatory  Vitals:   10/10/22 1100  BP: 124/68  Pulse: 64  Resp: 18  Temp: 98.3 F (36.8 C)  SpO2: 99%    Filed Weights   10/10/22 1100  Weight: 230 lb 9.6 oz (104.6 kg)     Physical Exam Vitals and nursing note reviewed.  HENT:     Head: Normocephalic and atraumatic.     Mouth/Throat:     Pharynx: Oropharynx is clear.  Eyes:     Extraocular Movements: Extraocular movements intact.     Pupils: Pupils are equal, round, and reactive to light.  Cardiovascular:     Rate and Rhythm: Normal rate and regular rhythm.  Pulmonary:     Comments: Decreased breath sounds bilaterally.  Abdominal:     Palpations: Abdomen is soft.  Musculoskeletal:        General: Normal range of motion.     Cervical back: Normal range of motion.  Skin:    General: Skin is warm.  Neurological:     General: No focal deficit present.     Mental Status: He is alert and oriented to person, place, and time.  Psychiatric:        Behavior: Behavior normal.        Judgment: Judgment normal.      LABORATORY DATA:  I have reviewed the data as listed Lab Results  Component Value Date   WBC 4.8 10/10/2022   HGB 13.4 10/10/2022   HCT 40.3 10/10/2022   MCV 90.6 10/10/2022   PLT 245 10/10/2022   Recent Labs    09/12/22 0812 10/03/22 1241 10/10/22 1046  NA 140 136 139  K 4.2 3.5  3.9  CL 107 102 106  CO2 _0 GLUCOSE 106* 165* 130*  BUN _1 CREATININE 1.11 1.12 1.06  CALCIUM 9.4 9.0 8.9  GFRNONAA >60 >60 >60  PROT 7.6 7.4 7.2  ALBUMIN 4.3 4.0 3.9  AST _2 ALT _3 ALKPHOS 61 70 67  BILITOT 0.8 0.8 0.6    RADIOGRAPHIC STUDIES: I have personally reviewed the radiological images as listed and agreed with the findings in the report. CT Hip Right Wo Contrast  Result Date: 09/12/2022 CLINICAL DATA:  HIP TRAUMA, FRACTURE SUSPECTED. RECENT ABNORMAL PET SCAN. EXAM: CT OF THE RIGHT HIP WITHOUT CONTRAST, CT OF THE RIGHT FEMUR WITHOUT CONTRAST TECHNIQUE: Multidetector CT imaging of the right hip was performed according to the standard protocol. Multiplanar CT image reconstructions were also generated. RADIATION DOSE REDUCTION: This exam was performed according to the departmental dose-optimization program which includes automated exposure control, adjustment of the mA and/or kV according to patient size and/or use of iterative reconstruction technique. COMPARISON:  MULTIPLE PRIOR IMAGING STUDIES INCLUDING TO PRIOR PET CTS AND RECENT RADIOGRAPHS 07/11/2022 FINDINGS: The right hip is normally located. No stress fracture or AVN. Subchondral cyst noted at the head neck junction region. Subtrochanteric cortical longitudinal fracture involving the medial cortex of the femur possibly a stress fracture related to patient's underlying metastatic lesion and radiation treatment. No through and through fracture is identified. The remainder of the femur is unremarkable. No bone lesions or other fractures. The hip and pelvic musculature is grossly normal. No obvious muscle lesions, muscle tear or intramuscular hematoma. No subcutaneous lesions. No  significant intrapelvic abnormalities are identified. Brachytherapy seeds are noted in the prostate gland. No right-sided inguinal or pelvic adenopathy. Stable scattered vascular disease. IMPRESSION: Subtrochanteric cortical  longitudinal fracture involving the medial cortex of the femur possibly a stress fracture related to patient's underlying metastatic lesion and radiation treatment. Electronically Signed   By: Marijo Sanes M.D.   On: 09/12/2022 08:57   CT FEMUR RIGHT WO CONTRAST  Result Date: 09/12/2022 CLINICAL DATA:  HIP TRAUMA, FRACTURE SUSPECTED. RECENT ABNORMAL PET SCAN. EXAM: CT OF THE RIGHT HIP WITHOUT CONTRAST, CT OF THE RIGHT FEMUR WITHOUT CONTRAST TECHNIQUE: Multidetector CT imaging of the right hip was performed according to the standard protocol. Multiplanar CT image reconstructions were also generated. RADIATION DOSE REDUCTION: This exam was performed according to the departmental dose-optimization program which includes automated exposure control, adjustment of the mA and/or kV according to patient size and/or use of iterative reconstruction technique. COMPARISON:  MULTIPLE PRIOR IMAGING STUDIES INCLUDING TO PRIOR PET CTS AND RECENT RADIOGRAPHS 07/11/2022 FINDINGS: The right hip is normally located. No stress fracture or AVN. Subchondral cyst noted at the head neck junction region. Subtrochanteric cortical longitudinal fracture involving the medial cortex of the femur possibly a stress fracture related to patient's underlying metastatic lesion and radiation treatment. No through and through fracture is identified. The remainder of the femur is unremarkable. No bone lesions or other fractures. The hip and pelvic musculature is grossly normal. No obvious muscle lesions, muscle tear or intramuscular hematoma. No subcutaneous lesions. No significant intrapelvic abnormalities are identified. Brachytherapy seeds are noted in the prostate gland. No right-sided inguinal or pelvic adenopathy. Stable scattered vascular disease. IMPRESSION: Subtrochanteric cortical longitudinal fracture involving the medial cortex of the femur possibly a stress fracture related to patient's underlying metastatic lesion and radiation  treatment. Electronically Signed   By: Marijo Sanes M.D.   On: 09/12/2022 08:57    ASSESSMENT & PLAN:   Primary adenocarcinoma of upper lobe of right lung ([place]) # Non-small cell lung cancer/stage IV-favor adeno carcinoma-metastases to right femur/liver; synchronous squamous cell carcinoma scalp  SEP 25th, 2023- PET scan: Response to therapy noted; no residual activity in the right femoral metadiaphysis bony metastases [however see below regarding worsening pain]; liver metastases improved; no new sites of metastasis noted. Patient currently on maintanence Libatayo.  STABLE.   # Proceed with Single agent Libtayo maintenance- Labs today reviewed;  acceptable for treatment today.  DEC 2023- TSH-WNL>    # Impending right hip fracture right hip pain-status post radiation [[place]; DEC 2022]-PET-SEP 2023-showed decreased uptake in the right hip; however given worsening pain concerning for progression of disease.  S/p  RT on 10/03 x5 Fx. NOV 2023-status post intramedullary nail fixation due to an impending femur fracture secondary to metastatic disease to bone.  Discussed the role of Zometa to decrease skeletal related events [pain; fractures; need for radiation; hypercalcemia].  Discussed the potential side effects including but not limited to-infusion reaction; hypocalcemia; and Osteo-necrosis of jaw. Recommend dental clearance.    # Low vit D- [July 2023- vit D- 25]; on  ergocalciferol- Calcium 9.2- improved/STABLE.  # Squamous cell carcinoma of the scalp-s/p excision; positive margins- on libtayo-no clinical evidence of progression.  Might need to consider radiation-if any local progression noted/also based on course lung cancer.  STABLE.    # GERD- on prilosec recommend PPI BID prior to meals- STABLE.     # IV access: PIV  # DISPOSITION: #Libtayo;- treatment  Today. # as per IS- follow up in 3  weeks- MD;labs- cbc/cmp; Libtayo; PET prior [before the end of the year]- -  Dr.B                 All questions were answered. The patient knows to call the clinic with any problems, questions or concerns.       Cammie Sickle, MD 10/10/2022 1:14 PM

## 2022-10-10 NOTE — Assessment & Plan Note (Addendum)
#   Non-small cell lung cancer/stage IV-favor adeno carcinoma-metastases to right femur/liver; synchronous squamous cell carcinoma scalp  SEP 25th, 2023- PET scan: Response to therapy noted; no residual activity in the right femoral metadiaphysis bony metastases [however see below regarding worsening pain]; liver metastases improved; no new sites of metastasis noted. Patient currently on maintanence Libatayo.  STABLE.   # Proceed with Single agent Libtayo maintenance- Labs today reviewed;  acceptable for treatment today.  DEC 2023- TSH-WNL>    # Impending right hip fracture right hip pain-status post radiation [[place]; DEC 2022]-PET-SEP 2023-showed decreased uptake in the right hip; however given worsening pain concerning for progression of disease.  S/p  RT on 10/03 x5 Fx. NOV 2023-status post intramedullary nail fixation due to an impending femur fracture secondary to metastatic disease to bone.  Discussed the role of Zometa to decrease skeletal related events [pain; fractures; need for radiation; hypercalcemia].  Discussed the potential side effects including but not limited to-infusion reaction; hypocalcemia; and Osteo-necrosis of jaw. Recommend dental clearance.    # Low vit D- [July 2023- vit D- 25]; on  ergocalciferol- Calcium 9.2- improved/STABLE.  # Squamous cell carcinoma of the scalp-s/p excision; positive margins- on libtayo-no clinical evidence of progression.  Might need to consider radiation-if any local progression noted/also based on course lung cancer.  STABLE.    # GERD- on prilosec recommend PPI BID prior to meals- STABLE.     # IV access: PIV  # DISPOSITION: #Libtayo;- treatment  Today. # as per IS- follow up in 3 weeks- MD;labs- cbc/cmp; Libtayo; PET prior [before the end of the year]- - Dr.B

## 2022-10-10 NOTE — Patient Instructions (Signed)
[facility] [place]  Discharge Instructions: Thank you for choosing [facility] to provide your oncology and hematology care.  If you have a lab appointment with the [facility], please go directly to the [facility] and check in at the registration area.  Wear comfortable clothing and clothing appropriate for easy access to any Portacath or PICC line.   We strive to give you quality time with your provider. You may need to reschedule your appointment if you arrive late (15 or more minutes).  Arriving late affects you and other patients whose appointments are after yours.  Also, if you miss three or more appointments without notifying the office, you may be dismissed from the clinic at the provider's discretion.      For prescription refill requests, have your pharmacy contact our office and allow 72 hours for refills to be completed.    To help prevent nausea and vomiting after your treatment, we encourage you to take your nausea medication as directed.  BELOW ARE SYMPTOMS THAT SHOULD BE REPORTED IMMEDIATELY: *FEVER GREATER THAN 100.4 F (38 C) OR HIGHER *CHILLS OR SWEATING *NAUSEA AND VOMITING THAT IS NOT CONTROLLED WITH YOUR NAUSEA MEDICATION *UNUSUAL SHORTNESS OF BREATH *UNUSUAL BRUISING OR BLEEDING *URINARY PROBLEMS (pain or burning when urinating, or frequent urination) *BOWEL PROBLEMS (unusual diarrhea, constipation, pain near the anus) TENDERNESS IN MOUTH AND THROAT WITH OR WITHOUT PRESENCE OF ULCERS (sore throat, sores in mouth, or a toothache) UNUSUAL RASH, SWELLING OR PAIN   Items with * indicate a potential emergency and should be followed up as soon as possible or go to the Emergency Department if any problems should occur.  Please show the CHEMOTHERAPY ALERT CARD or IMMUNOTHERAPY ALERT CARD at check-in to the Emergency Department and triage nurse.  Should you have questions after your visit or need to cancel or reschedule your  appointment, please contact [facility] [place] [facility] [place]  [phone] and follow the prompts.  Office hours are 8:00 a.m. to 4:30 p.m. Monday - Friday. Please note that voicemails left after 4:00 p.m. may not be returned until the following business day.  We are closed weekends and major holidays. You have access to a nurse at all times for urgent questions. Please call the main number to the clinic [phone] and follow the prompts.  For any non-urgent questions, you may also contact your provider using MyChart. We now offer e-Visits for anyone 21 and older to request care online for non-urgent symptoms. For details visit [email or web address].   Also download the MyChart app! Go to the app store, search "MyChart", open the app, select [facility], and log in with your MyChart username and password.  Masks are optional in the cancer centers. If you would like for your care team to wear a mask while they are taking care of you, please let them know. For doctor visits, patients may have with them one support person who is at least 61 years old. At this time, visitors are not allowed in the infusion area.

## 2022-10-11 ENCOUNTER — Inpatient Hospital Stay: Payer: BC Managed Care – PPO | Admitting: Internal Medicine

## 2022-10-11 ENCOUNTER — Inpatient Hospital Stay: Payer: BC Managed Care – PPO

## 2022-10-11 DIAGNOSIS — M84551D Pathological fracture in neoplastic disease, right femur, subsequent encounter for fracture with routine healing: Secondary | ICD-10-CM | POA: Diagnosis not present

## 2022-10-11 DIAGNOSIS — Z9181 History of falling: Secondary | ICD-10-CM | POA: Diagnosis not present

## 2022-10-11 DIAGNOSIS — C3491 Malignant neoplasm of unspecified part of right bronchus or lung: Secondary | ICD-10-CM | POA: Diagnosis not present

## 2022-10-11 DIAGNOSIS — C7951 Secondary malignant neoplasm of bone: Secondary | ICD-10-CM | POA: Diagnosis not present

## 2022-10-11 DIAGNOSIS — C787 Secondary malignant neoplasm of liver and intrahepatic bile duct: Secondary | ICD-10-CM | POA: Diagnosis not present

## 2022-10-11 DIAGNOSIS — E785 Hyperlipidemia, unspecified: Secondary | ICD-10-CM | POA: Diagnosis not present

## 2022-10-11 DIAGNOSIS — K219 Gastro-esophageal reflux disease without esophagitis: Secondary | ICD-10-CM | POA: Diagnosis not present

## 2022-10-11 DIAGNOSIS — I10 Essential (primary) hypertension: Secondary | ICD-10-CM | POA: Diagnosis not present

## 2022-10-11 DIAGNOSIS — Z955 Presence of coronary angioplasty implant and graft: Secondary | ICD-10-CM | POA: Diagnosis not present

## 2022-10-11 DIAGNOSIS — F419 Anxiety disorder, unspecified: Secondary | ICD-10-CM | POA: Diagnosis not present

## 2022-10-11 DIAGNOSIS — I2510 Atherosclerotic heart disease of native coronary artery without angina pectoris: Secondary | ICD-10-CM | POA: Diagnosis not present

## 2022-10-11 DIAGNOSIS — Z85828 Personal history of other malignant neoplasm of skin: Secondary | ICD-10-CM | POA: Diagnosis not present

## 2022-10-11 DIAGNOSIS — R531 Weakness: Secondary | ICD-10-CM | POA: Diagnosis not present

## 2022-10-11 DIAGNOSIS — I252 Old myocardial infarction: Secondary | ICD-10-CM | POA: Diagnosis not present

## 2022-10-11 DIAGNOSIS — Z7902 Long term (current) use of antithrombotics/antiplatelets: Secondary | ICD-10-CM | POA: Diagnosis not present

## 2022-10-11 DIAGNOSIS — Z791 Long term (current) use of non-steroidal anti-inflammatories (NSAID): Secondary | ICD-10-CM | POA: Diagnosis not present

## 2022-10-12 ENCOUNTER — Ambulatory Visit: Payer: BC Managed Care – PPO | Admitting: Cardiovascular Disease

## 2022-10-15 ENCOUNTER — Encounter: Payer: Self-pay | Admitting: Hospice and Palliative Medicine

## 2022-10-23 ENCOUNTER — Ambulatory Visit: Payer: BC Managed Care – PPO

## 2022-10-25 ENCOUNTER — Other Ambulatory Visit: Payer: Self-pay | Admitting: Internal Medicine

## 2022-10-25 ENCOUNTER — Encounter: Payer: Self-pay | Admitting: Internal Medicine

## 2022-10-25 ENCOUNTER — Telehealth: Payer: Self-pay | Admitting: *Deleted

## 2022-10-25 DIAGNOSIS — C3411 Malignant neoplasm of upper lobe, right bronchus or lung: Secondary | ICD-10-CM

## 2022-10-25 NOTE — Telephone Encounter (Signed)
Elizabeth called reporting that patient insurance has denied the PET scan and she needs Dr B to go ahead and order the CT and Bone scan ASAP since the PET was to have been done last week and he has met his deductible for this year 

## 2022-10-25 NOTE — Telephone Encounter (Signed)
There is another message from today where Dr. B discusses ordering CT.

## 2022-10-25 NOTE — Progress Notes (Signed)
PET denied; ordered CT CAP; bone scan ASAP.   Thanks GB

## 2022-10-26 ENCOUNTER — Ambulatory Visit
Admission: RE | Admit: 2022-10-26 | Discharge: 2022-10-26 | Disposition: A | Discharge status: Home / Self Care | Payer: BC Managed Care – PPO | Source: Ambulatory Visit | Attending: Internal Medicine | Admitting: Internal Medicine

## 2022-10-26 ENCOUNTER — Encounter
Admission: RE | Admit: 2022-10-26 | Discharge: 2022-10-26 | Disposition: A | Discharge status: Home / Self Care | Payer: BC Managed Care – PPO | Source: Ambulatory Visit | Attending: Internal Medicine | Admitting: Internal Medicine

## 2022-10-26 DIAGNOSIS — C3411 Malignant neoplasm of upper lobe, right bronchus or lung: Secondary | ICD-10-CM

## 2022-10-26 DIAGNOSIS — K5730 Diverticulosis of large intestine without perforation or abscess without bleeding: Secondary | ICD-10-CM | POA: Diagnosis not present

## 2022-10-26 DIAGNOSIS — C3490 Malignant neoplasm of unspecified part of unspecified bronchus or lung: Secondary | ICD-10-CM | POA: Diagnosis not present

## 2022-10-26 DIAGNOSIS — R918 Other nonspecific abnormal finding of lung field: Secondary | ICD-10-CM | POA: Diagnosis not present

## 2022-10-26 DIAGNOSIS — C801 Malignant (primary) neoplasm, unspecified: Secondary | ICD-10-CM | POA: Diagnosis not present

## 2022-10-26 DIAGNOSIS — C787 Secondary malignant neoplasm of liver and intrahepatic bile duct: Secondary | ICD-10-CM | POA: Diagnosis not present

## 2022-10-26 MED ORDER — IOHEXOL 300 MG/ML  SOLN
100.0000 mL | Freq: Once | INTRAMUSCULAR | Status: AC | PRN
  Administered 2022-10-26: 100 mL via INTRAVENOUS

## 2022-10-26 MED ORDER — TECHNETIUM TC 99M MEDRONATE IV KIT
20.0000 | PACK | Freq: Once | INTRAVENOUS | Status: AC | PRN
  Administered 2022-10-26: 21.7 via INTRAVENOUS

## 2022-10-30 ENCOUNTER — Encounter: Payer: Self-pay | Admitting: Internal Medicine

## 2022-10-31 ENCOUNTER — Encounter: Payer: Self-pay | Admitting: Internal Medicine

## 2022-10-31 ENCOUNTER — Inpatient Hospital Stay: Payer: BC Managed Care – PPO | Attending: Internal Medicine

## 2022-10-31 ENCOUNTER — Inpatient Hospital Stay: Payer: BC Managed Care – PPO

## 2022-10-31 ENCOUNTER — Inpatient Hospital Stay (HOSPITAL_BASED_OUTPATIENT_CLINIC_OR_DEPARTMENT_OTHER): Payer: BC Managed Care – PPO | Admitting: Internal Medicine

## 2022-10-31 VITALS — BP 118/92 | HR 68 | Temp 98.2°F | Resp 18 | Wt 237.0 lb

## 2022-10-31 DIAGNOSIS — C3411 Malignant neoplasm of upper lobe, right bronchus or lung: Secondary | ICD-10-CM | POA: Diagnosis not present

## 2022-10-31 DIAGNOSIS — C4442 Squamous cell carcinoma of skin of scalp and neck: Secondary | ICD-10-CM | POA: Diagnosis not present

## 2022-10-31 DIAGNOSIS — C787 Secondary malignant neoplasm of liver and intrahepatic bile duct: Secondary | ICD-10-CM | POA: Diagnosis not present

## 2022-10-31 DIAGNOSIS — K219 Gastro-esophageal reflux disease without esophagitis: Secondary | ICD-10-CM | POA: Diagnosis not present

## 2022-10-31 DIAGNOSIS — Z79899 Other long term (current) drug therapy: Secondary | ICD-10-CM | POA: Diagnosis not present

## 2022-10-31 DIAGNOSIS — C7951 Secondary malignant neoplasm of bone: Secondary | ICD-10-CM | POA: Diagnosis not present

## 2022-10-31 DIAGNOSIS — Z5111 Encounter for antineoplastic chemotherapy: Secondary | ICD-10-CM | POA: Insufficient documentation

## 2022-10-31 LAB — COMPREHENSIVE METABOLIC PANEL
ALT: 21 U/L (ref 0–44)
AST: 21 U/L (ref 15–41)
Albumin: 4.2 g/dL (ref 3.5–5.0)
Alkaline Phosphatase: 70 U/L (ref 38–126)
Anion gap: 9 (ref 5–15)
BUN: 19 mg/dL (ref 8–23)
CO2: 24 mmol/L (ref 22–32)
Calcium: 8.8 mg/dL — ABNORMAL LOW (ref 8.9–10.3)
Chloride: 104 mmol/L (ref 98–111)
Creatinine, Ser: 0.96 mg/dL (ref 0.61–1.24)
GFR, Estimated: >60 mL/min (ref >60)
Glucose, Bld: 115 mg/dL — ABNORMAL HIGH (ref 70–99)
Potassium: 3.8 mmol/L (ref 3.5–5.1)
Sodium: 137 mmol/L (ref 135–145)
Total Bilirubin: 0.5 mg/dL (ref 0.3–1.2)
Total Protein: 7.7 g/dL (ref 6.5–8.1)

## 2022-10-31 LAB — CBC WITH DIFFERENTIAL/PLATELET
Abs Immature Granulocytes: 0.01 10*3/uL (ref 0.00–0.07)
Basophils Absolute: 0 10*3/uL (ref 0.0–0.1)
Basophils Relative: 1 %
Eosinophils Absolute: 0.1 10*3/uL (ref 0.0–0.5)
Eosinophils Relative: 2 %
HCT: 44.5 % (ref 39.0–52.0)
Hemoglobin: 15 g/dL (ref 13.0–17.0)
Immature Granulocytes: 0 %
Lymphocytes Relative: 18 %
Lymphs Abs: 1 10*3/uL (ref 0.7–4.0)
MCH: 30.1 pg (ref 26.0–34.0)
MCHC: 33.7 g/dL (ref 30.0–36.0)
MCV: 89.4 fL (ref 80.0–100.0)
Monocytes Absolute: 0.7 10*3/uL (ref 0.1–1.0)
Monocytes Relative: 13 %
Neutro Abs: 3.7 10*3/uL (ref 1.7–7.7)
Neutrophils Relative %: 66 %
Platelets: 244 10*3/uL (ref 150–400)
RBC: 4.98 MIL/uL (ref 4.22–5.81)
RDW: 14.3 % (ref 11.5–15.5)
WBC: 5.6 10*3/uL (ref 4.0–10.5)
nRBC: 0 % (ref 0.0–0.2)

## 2022-10-31 MED ORDER — SODIUM CHLORIDE 0.9 % IV SOLN
Freq: Once | INTRAVENOUS | Status: AC
  Filled 2022-10-31: qty 250

## 2022-10-31 MED ORDER — SODIUM CHLORIDE 0.9 % IV SOLN
350.0000 mg | Freq: Once | INTRAVENOUS | Status: AC
  Administered 2022-10-31: 350 mg via INTRAVENOUS
  Filled 2022-10-31: qty 7

## 2022-10-31 NOTE — Patient Instructions (Signed)
[facility] AT [place]  Discharge Instructions: Thank you for choosing [facility] to provide your oncology and hematology care.  If you have a lab appointment with the [facility], please go directly to the [facility] and check in at the registration area.  Wear comfortable clothing and clothing appropriate for easy access to any Portacath or PICC line.   We strive to give you quality time with your provider. You may need to reschedule your appointment if you arrive late (15 or more minutes).  Arriving late affects you and other patients whose appointments are after yours.  Also, if you miss three or more appointments without notifying the office, you may be dismissed from the clinic at the provider's discretion.      For prescription refill requests, have your pharmacy contact our office and allow 72 hours for refills to be completed.    To help prevent nausea and vomiting after your treatment, we encourage you to take your nausea medication as directed.  BELOW ARE SYMPTOMS THAT SHOULD BE REPORTED IMMEDIATELY: *FEVER GREATER THAN 100.4 F (38 C) OR HIGHER *CHILLS OR SWEATING *NAUSEA AND VOMITING THAT IS NOT CONTROLLED WITH YOUR NAUSEA MEDICATION *UNUSUAL SHORTNESS OF BREATH *UNUSUAL BRUISING OR BLEEDING *URINARY PROBLEMS (pain or burning when urinating, or frequent urination) *BOWEL PROBLEMS (unusual diarrhea, constipation, pain near the anus) TENDERNESS IN MOUTH AND THROAT WITH OR WITHOUT PRESENCE OF ULCERS (sore throat, sores in mouth, or a toothache) UNUSUAL RASH, SWELLING OR PAIN   Items with * indicate a potential emergency and should be followed up as soon as possible or go to the Emergency Department if any problems should occur.  Please show the CHEMOTHERAPY ALERT CARD or IMMUNOTHERAPY ALERT CARD at check-in to the Emergency Department and triage nurse.  Should you have questions after your visit or need to cancel or reschedule your  appointment, please contact [facility] [place]  [phone] and follow the prompts.  Office hours are 8:00 a.m. to 4:30 p.m. Monday - Friday. Please note that voicemails left after 4:00 p.m. may not be returned until the following business day.  We are closed weekends and major holidays. You have access to a nurse at all times for urgent questions. Please call the main number to the clinic [phone] and follow the prompts.  For any non-urgent questions, you may also contact your provider using MyChart. We now offer e-Visits for anyone 57 and older to request care online for non-urgent symptoms. For details visit [email or web address].   Also download the MyChart app! Go to the app store, search "MyChart", open the app, select [facility], and log in with your MyChart username and password.  Masks are optional in the cancer centers. If you would like for your care team to wear a mask while they are taking care of you, please let them know. For doctor visits, patients may have with them one support person who is at least 62 years old. At this time, visitors are not allowed in the infusion area.

## 2022-10-31 NOTE — Patient Instructions (Signed)
#   Dental clearance for Zometa.

## 2022-10-31 NOTE — Progress Notes (Signed)
Patient here today for follow up and treatment consideration regarding lung cancer. Patient would like to discuss adjusting dose of meloxicam if possible. Patient also needs RF on Tramadol. Patient denies other concerns.

## 2022-10-31 NOTE — Progress Notes (Signed)
[facility] CONSULT NOTE  Patient Care Team: Casilda Carls, MD as PCP - General (Internal Medicine) Lorretta Harp, MD as PCP - Cardiology (Cardiology) Curt Bears, MD as Consulting Physician (Oncology) Casilda Carls, MD as Referring Physician (Internal Medicine) Cammie Sickle, MD as Consulting Physician (Internal Medicine) Cammie Sickle, MD as Consulting Physician (Internal Medicine)  CHIEF COMPLAINTS/PURPOSE OF CONSULTATION: lung cancer  #  Oncology History Overview Note  DIAGNOSIS: 1) stage IV (T1c, N0, M1 C) non-small cell lung cancer favoring adenocarcinoma presented with right lung apical nodule in addition to metastatic disease in the right hepatic lobe and the proximal right femoral diaphysis diagnosed and December 2022. 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    QNS; Guardant 360 showed positive KRAS G12C mutation  FINAL MICROSCOPIC DIAGNOSIS:   A. LUNG, RUL, FINE NEEDLE ASPIRATION:  - Malignant cells consistent with non-small cell carcinoma, see comment   B. LUNG, RUL, BRUSHING:  - Malignant cells consistent with non-small cell carcinoma, see comment       COMMENT:   A and B.  Dr. Saralyn Pilar reviewed the case and concurs with the diagnosis.  Only rare malignant cells are present on the cellblock.  Immunohistochemical stains were attempted and show that the tumor cells  have patchy staining for TTF-1 whereas p63, p40 and CK5/6 are negative.  The findings are nondiagnostic but suggestive of a lung adenocarcinoma.  Dr. Lamonte Sakai was notified on 10/13/2021.   SEP-OCT 2022-  [Dermatology]-   Poorly differentiated squamous cell carcinoma; -  Carcinoma extends to the edges of the excision specimen   IMPRESSION: 1. The spiculated nodule at the right lung apex on recent neck CT is hypermetabolic and is concerning for primary bronchogenic  carcinoma. Tissue sampling recommended. 2. Hypermetabolic activity within the occipital scalp and small previously demonstrated right occipital lymph node compatible with known squamous cell carcinoma. Evaluation of the head and neck limited by motion artifact. 3. Hypermetabolic lesions inferiorly in the right hepatic lobe and in the proximal right femoral diaphysis suspicious for metastatic disease, primary uncertain in this patient with a history of prostate cancer. Correlate with PSA levels. Abdominal MRI without and with contrast may be helpful for further characterization of the liver lesion.  # s/p RT tto RUL [[place]]; right hip RT;   # 2007MI-CAD [s/p stent-Dr.Berry; EF 2021- 58%]     Primary adenocarcinoma of upper lobe of right lung ([place])  10/18/2021 Initial Diagnosis   Primary adenocarcinoma of upper lobe of right lung ([place])   10/18/2021 Cancer Staging   Staging form: Lung, AJCC 8th Edition - Clinical: Stage IVB (cT1c, cN0, cM1c) - Signed by Curt Bears, MD on 10/18/2021   11/01/2021 - 05/30/2022 Chemotherapy   Patient is on Treatment Plan : LUNG NSCLC Pemetrexed + Carboplatin q21d x 4 Cycles     11/01/2021 -  Chemotherapy   Patient is on Treatment Plan : LUNG NSCLC Libtayo q  21d       HISTORY OF PRESENTING ILLNESS: Ambulating with a cane.  Accompanied by his wife.   Chris [name] 62 y.o.  male metastatic stage IV non-small cell lung cancer/favor adenocarcinoma-currently on maintained libatyo here for follow-up-review results of the CT scan/bone scan.  Patient is recovering well from his recent right hip surgery for impending fracture. He is currently walking with a cane.  No falls. Denies any fever chills.  No nausea no vomiting.   Patient not  taking tramadol because of concern for constipation.  Currently on meloxicam.  Currently on physical therapy.  Review of Systems  Constitutional:  Positive for malaise/fatigue. Negative for chills, diaphoresis, fever and  weight loss.  HENT:  Positive for hearing loss. Negative for nosebleeds and sore throat.   Eyes:  Negative for double vision.  Respiratory:  Negative for cough, hemoptysis, sputum production, shortness of breath and wheezing.   Cardiovascular:  Negative for chest pain, palpitations, orthopnea and leg swelling.  Gastrointestinal:  Negative for abdominal pain, blood in stool, diarrhea, heartburn, melena, nausea and vomiting.  Genitourinary:  Negative for dysuria, frequency and urgency.  Musculoskeletal:  Negative for back pain.  Skin: Negative.  Negative for itching and rash.  Neurological:  Negative for dizziness, tingling, focal weakness, weakness and headaches.  Endo/Heme/Allergies:  Does not bruise/bleed easily.  Psychiatric/Behavioral:  Negative for depression. The patient does not have insomnia.      MEDICAL HISTORY:  Past Medical History:  Diagnosis Date   Anxiety    associated with medical care,  worsened by difficulty hearing, does better with wife present   Complication of anesthesia    wife states very anxious may need pre sedation   Coronary artery disease    GERD (gastroesophageal reflux disease)    Hearing loss    mild per wife   Hearing loss    no hearing aids per wife   Hyperlipidemia    Hypertension    MI (myocardial infarction) ([place]) 04/17/2006   Acute inferolateral wall MI with cardiogenic shock and complete heart block   Primary adenocarcinoma of upper lobe of right lung ([place] [facility])    Prostate cancer ([place])    Tobacco abuse    Wears glasses    Wears partial dentures    Upper    SURGICAL HISTORY: Past Surgical History:  Procedure Laterality Date   BRONCHIAL BIOPSY  10/09/2021   Procedure: BRONCHIAL BIOPSIES;  Surgeon: Collene Gobble, MD;  Location: [facility];  Service: Pulmonary;;   BRONCHIAL BRUSHINGS  10/09/2021   Procedure: BRONCHIAL BRUSHINGS;  Surgeon: Collene Gobble, MD;  Location: [facility] ENDOSCOPY;  Service: Pulmonary;;   BRONCHIAL NEEDLE ASPIRATION  BIOPSY  10/09/2021   Procedure: BRONCHIAL NEEDLE ASPIRATION BIOPSIES;  Surgeon: Collene Gobble, MD;  Location: [facility] ENDOSCOPY;  Service: Pulmonary;;   CARDIAC CATHETERIZATION  2007   left, RCA 100% occluded ruptured plaque with thrombus in the proximal segment   CYST EXCISION N/A 08/30/2021   Procedure: EXCISION OF POSTERIOR SCALP CYST;  Surgeon: Clovis Riley, MD;  Location: [facility];  Service: General;  Laterality: N/A;   CYSTOSCOPY N/A 07/17/2018   Procedure: Consuela Mimes;  Surgeon: Franchot Gallo, MD;  Location[facility];  Service: Urology;  Laterality: N/A;  no seeds in bladder per Dr Diona Fanti   FIDUCIAL MARKER PLACEMENT  10/09/2021   Procedure: FIDUCIAL MARKER PLACEMENT;  Surgeon: Collene Gobble, MD;  Location: [facility] ENDOSCOPY;  Service: Pulmonary;;   HAND SURGERY Right    has metal plate in arm   PROSTATE BIOPSY     RADIOACTIVE SEED IMPLANT N/A 07/17/2018   Procedure: RADIOACTIVE SEED IMPLANT/BRACHYTHERAPY IMPLANT;  Surgeon: Franchot Gallo, MD;  Location: [facility];  Service: Urology;  Laterality: N/A;  77 seeds   SPACE OAR INSTILLATION N/A 07/17/2018   Procedure: SPACE OAR INSTILLATION;  Surgeon: Franchot Gallo, MD;  Location[facility];  Service: Urology;  Laterality: N/A;   VIDEO BRONCHOSCOPY WITH RADIAL ENDOBRONCHIAL ULTRASOUND  10/09/2021   Procedure:  VIDEO BRONCHOSCOPY WITH RADIAL ENDOBRONCHIAL ULTRASOUND;  Surgeon: Collene Gobble, MD;  Location: [facility] ENDOSCOPY;  Service: Pulmonary;;   WRIST SURGERY  10/04/2012    SOCIAL HISTORY: Social History   Socioeconomic History   Marital status: Married    Spouse name: Not on file   Number of children: Not on file   Years of education: Not on file   Highest education level: Not on file  Occupational History   Not on file  Tobacco Use   Smoking status: Every Day    Packs/day: 1.50    Years: 42.00    Total pack years: 63.00    Types: Cigarettes   Smokeless tobacco:  Never  Vaping Use   Vaping Use: Never used  Substance and Sexual Activity   Alcohol use: No   Drug use: No   Sexual activity: Yes    Birth control/protection: None  Other Topics Concern   Not on file  Social History Narrative   10-04 19 Unable to ask abuse questions wife with him today.   Social Determinants of Health   Financial Resource Strain: Not on file  Food Insecurity: Not on file  Transportation Needs: Not on file  Physical Activity: Not on file  Stress: Not on file  Social Connections: Not on file  Intimate Partner Violence: Not on file    FAMILY HISTORY: Family History  Problem Relation Age of Onset   Breast cancer Mother    Prostate cancer Neg Hx    Kidney cancer Neg Hx    Cancer Neg Hx     ALLERGIES:  has no allergies on file.  MEDICATIONS:  Current Outpatient Medications  Medication Sig Dispense Refill   ALPRAZolam (XANAX) 0.25 MG tablet Take 1 tablet (0.25 mg total) by mouth at bedtime. 15 tablet 0   aspirin EC 81 MG tablet Take 81 mg by mouth daily.     atorvastatin (LIPITOR) 20 MG tablet Take 1 tablet (20 mg total) by mouth daily. 90 tablet 3   clopidogrel (PLAVIX) 75 MG tablet Take 1 tablet (75 mg total) by mouth daily. 90 tablet 3   ergocalciferol (VITAMIN D2) 1.25 MG (50000 UT) capsule Take 1 capsule (50,000 Units total) by mouth once a week. 12 capsule 1   ketoconazole (NIZORAL) 2 % cream Apply 1 application. topically 2 (two) times daily as needed for irritation.     meloxicam (MOBIC) 15 MG tablet Take 1 tablet (15 mg total) by mouth daily. 30 tablet 2   metoprolol tartrate (LOPRESSOR) 25 MG tablet Take 1 tablet (25 mg total) by mouth daily. 90 tablet 3   pantoprazole (PROTONIX) 40 MG tablet Take 1 tablet (40 mg total) by mouth daily. 60 tablet 1   prochlorperazine (COMPAZINE) 10 MG tablet Take 1 tablet (10 mg total) by mouth every 6 (six) hours as needed for nausea or vomiting. 30 tablet 1   sennosides-docusate sodium (SENOKOT-S) 8.6-50 MG tablet  Take 1 tablet by mouth daily.     sertraline (ZOLOFT) 100 MG tablet TAKE 1 TABLET BY MOUTH EVERY DAY 90 tablet 0   Sodium Borate POWD Take 1 tablet by mouth daily at 12 noon.     tamsulosin (FLOMAX) 0.4 MG CAPS capsule Take 1 capsule (0.4 mg total) by mouth daily. 30 capsule 3   traMADol (ULTRAM) 50 MG tablet Take 1 tablet (50 mg total) by mouth every 6 (six) hours as needed. 30 tablet 0   Vitamin D, Ergocalciferol, 50000 units CAPS Take 1 capsule by mouth  once a week.     No current facility-administered medications for this visit.      Marland Kitchen  PHYSICAL EXAMINATION: ECOG PERFORMANCE STATUS: 1 - Symptomatic but completely ambulatory  Vitals:   10/31/22 0955  BP: (!) 118/92  Pulse: 68  Resp: 18  Temp: 98.2 F (36.8 C)  SpO2: 100%    Filed Weights   10/31/22 0955  Weight: 237 lb (107.5 kg)     Physical Exam Vitals and nursing note reviewed.  HENT:     Head: Normocephalic and atraumatic.     Mouth/Throat:     Pharynx: Oropharynx is clear.  Eyes:     Extraocular Movements: Extraocular movements intact.     Pupils: Pupils are equal, round, and reactive to light.  Cardiovascular:     Rate and Rhythm: Normal rate and regular rhythm.  Pulmonary:     Comments: Decreased breath sounds bilaterally.  Abdominal:     Palpations: Abdomen is soft.  Musculoskeletal:        General: Normal range of motion.     Cervical back: Normal range of motion.  Skin:    General: Skin is warm.  Neurological:     General: No focal deficit present.     Mental Status: He is alert and oriented to person, place, and time.  Psychiatric:        Behavior: Behavior normal.        Judgment: Judgment normal.      LABORATORY DATA:  I have reviewed the data as listed Lab Results  Component Value Date   WBC 5.6 10/31/2022   HGB 15.0 10/31/2022   HCT 44.5 10/31/2022   MCV 89.4 10/31/2022   PLT 244 10/31/2022   Recent Labs    10/03/22 1241 10/10/22 1046 10/31/22 0912  NA 136 139 137  K 3.5  3.9 3.8  CL 102 106 104  CO2 _0 GLUCOSE 165* 130* 115*  BUN _1 CREATININE 1.12 1.06 0.96  CALCIUM 9.0 8.9 8.8*  GFRNONAA >60 >60 >60  PROT 7.4 7.2 7.7  ALBUMIN 4.0 3.9 4.2  AST _2 ALT _3 ALKPHOS 70 67 70  BILITOT 0.8 0.6 0.5    RADIOGRAPHIC STUDIES: I have personally reviewed the radiological images as listed and agreed with the findings in the report. CT CHEST ABDOMEN PELVIS W CONTRAST  Result Date: 10/30/2022 CLINICAL DATA:  A 62 year old male presents for evaluation of non-small cell lung cancer with history of metastatic disease, assess treatment response * Tracking Code: BO * EXAM: CT CHEST, ABDOMEN, AND PELVIS WITH CONTRAST TECHNIQUE: Multidetector CT imaging of the chest, abdomen and pelvis was performed following the standard protocol during bolus administration of intravenous contrast. RADIATION DOSE REDUCTION: This exam was performed according to the departmental dose-optimization program which includes automated exposure control, adjustment of the mA and/or kV according to patient size and/or use of iterative reconstruction technique. CONTRAST:  156m OMNIPAQUE IOHEXOL 300 MG/ML  SOLN COMPARISON:  April 23, 2022 FINDINGS: CT CHEST FINDINGS Cardiovascular: Calcified aortic atherosclerotic changes. Noncalcified plaque, mild. Normal heart size without substantial pericardial effusion or nodularity. Normal caliber of central pulmonary vasculature, unremarkable on venous phase. Mediastinum/Nodes: No thoracic inlet, axillary, mediastinal or hilar adenopathy. Esophagus grossly normal. Lungs/Pleura: Nodule in the RIGHT lung apex with surrounding mild septal thickening and ground-glass with decreased size at 13 x 8 mm previously 17 x 10 mm in July, 17 x 10 mm also on the September PET  evaluation. No new pulmonary nodules. No signs of pleural effusion or consolidative process. Airways are patent. Musculoskeletal: See below for full musculoskeletal details. No chest wall  mass. CT ABDOMEN PELVIS FINDINGS Hepatobiliary: Caudate hypertrophy and lobular contour of the caudate lobe. Fissural widening. Portal vein is patent. No pericholecystic stranding or biliary duct dilation. 5 mm hepatic subsegment VI lesion at the site of previous hepatic metastasis. Measuring up to 14 mm on prior imaging. Pancreas: Normal, without mass, inflammation or ductal dilatation. Spleen: Normal. Adrenals/Urinary Tract: RIGHT adrenal gland with stable size and homogeneous nodule at 2.3 cm previously shown to represent an adrenal adenoma. No dedicated imaging follow-up is recommended for this finding. LEFT adrenal with mild nodularity, benign appearance. Renal enhancement is symmetric without hydronephrosis or perinephric stranding. No suspicious renal lesion. No perivesical stranding. Stomach/Bowel: No acute gastrointestinal findings. Normal appendix. Sigmoid diverticulosis. Vascular/Lymphatic: Calcified and noncalcified aortic atherosclerotic plaque is mild-to-moderate. There is no gastrohepatic or hepatoduodenal ligament lymphadenopathy. No retroperitoneal or mesenteric lymphadenopathy. No pelvic sidewall lymphadenopathy. Reproductive: Brachytherapy throughout the prostate. Other: No ascites. Musculoskeletal: Post RIGHT hip ORIF, incompletely evaluated. No acute musculoskeletal process or destructive bone findings. Spinal degenerative changes. IMPRESSION: 1. Continued decrease in size of the RIGHT upper lobe pulmonary nodule. 2. Known hepatic metastatic lesion continues to decrease in size as well now at 5 mm. 3. Post ORIF of the RIGHT proximal femur incompletely evaluated. 4. No new or progressive findings. 5. RIGHT adrenal adenoma. Based on current clinical literature, biochemical evaluation to exclude possible functioning adrenal nodule is suggested if not already performed. Please refer to current clinical guidelines for detailed recommendations. NEJM 8563:149 [id number] 6. Sigmoid diverticulosis without  evidence of acute diverticulitis. 7. Caudate hypertrophy and fissural widening of hepatic fissures should be correlated with any clinical or laboratory evidence of liver disease. Findings reflect cirrhotic morphologic changes but without overt signs of portal hypertension. 8. Aortic atherosclerosis. Aortic Atherosclerosis (ICD10-I70.0). Electronically Signed   By: Zetta Bills M.D.   On: 10/30/2022 13:13   NM Bone Scan Whole Body  Result Date: 10/29/2022 CLINICAL DATA:  Metastatic lung cancer. EXAM: NUCLEAR MEDICINE WHOLE BODY BONE SCAN TECHNIQUE: Whole body anterior and posterior images were obtained approximately 3 hours after intravenous injection of radiopharmaceutical. RADIOPHARMACEUTICALS:  21.7 mCi Technetium-11mMDP IV COMPARISON:  Bone scan May 23, 2022. CT scan of the chest, abdomen, and pelvis October 23, 2022. FINDINGS: There is increased uptake throughout most of the right femur. There is a short segment of decreased uptake in the proximal diaphysis. No other scintigraphic evidence of bony metastatic disease. IMPRESSION: 1. There is diffuse uptake throughout most of the right femur with a small gap of decreased uptake in the proximal diaphysis. The increased uptake is likely a combination of the patient's known proximal femoral metastatic disease, recent fracture, and recent surgical fracture repair. 2. No other scintigraphic evidence of bony metastatic disease. Electronically Signed   By: DDorise BullionIII M.D.   On: 10/29/2022 11:58    ASSESSMENT & PLAN:   Primary adenocarcinoma of upper lobe of right lung ([place] # Non-small cell lung cancer/stage IV-favor adeno carcinoma-metastases to right femur/liver; synchronous squamous cell carcinoma scalp  S/P- Patient currently on maintanence Libatayo.  CT CAP- DEC 28th, 2023- Continued decrease in size of the RIGHT upper lobe pulmonary Nodule.; known hepatic metastatic lesion continues to decrease in size as well now at 5 mm. No new or  progressive findings.   # Proceed with Single agent Libtayo maintenance- Labs today reviewed;  acceptable  for treatment today.  DEC 2023- TSH-WNL>    # Impending right hip fracture right hip pain-status post radiation [[place]; DEC 2022]- S/p  Re-RT on 10/03 x5 Fx. NOV 2023-status post intramedullary nail fixation due to an impending femur fracture secondary  DEC 2023-bone scan no evidence of any distant bony disease; difficult to assess right femur because of recent surgery.  Discussed the role of Zometa to decrease skeletal related events [pain; fractures; need for radiation; hypercalcemia].  Discussed the potential side effects including but not limited to-infusion reaction; hypocalcemia; and Osteo-necrosis of jaw.  Recommend dental clearance.    # Low vit D- [July 2023- vit D- 25]; on  ergocalciferol- Calcium 8.8  improved/STABLE.  # Squamous cell carcinoma of the scalp-s/p excision; positive margins- on libtayo-no clinical evidence of progression.  Might need to consider radiation-if any local progression noted/also based on course lung cancer.  STABLE.    # GERD- on prilosec recommend PPI BID prior to meals- STABLE.     #Incidental findings on Imaging CT, 2023: RIGHT adrenal adenoma; Sigmoid diverticulosis 7. Caudate hypertrophy and fissural widening of hepatic fissures should be correlated with any clinical or laboratory evidence of liver disease. Findings reflect cirrhotic morphologic changes but without overt signs of portal hypertension; Aortic atherosclerosis.I reviewed/discussed/counseled the patient.   # IV access: PIV  # DISPOSITION: #Libtayo;- treatment  Today. # as per IS- follow up in 3 weeks- MD;labs- cbc/cmp; Libtayo- Dr.B  # I reviewed the blood work- with the patient in detail; also reviewed the imaging independently [as summarized above]; and with the patient in detail.  '                 All questions were answered. The patient knows to call the clinic with  any problems, questions or concerns.       Cammie Sickle, MD 10/31/2022 10:33 AM

## 2022-10-31 NOTE — Assessment & Plan Note (Addendum)
#   Non-small cell lung cancer/stage IV-favor adeno carcinoma-metastases to right femur/liver; synchronous squamous cell carcinoma scalp  S/P- Patient currently on maintanence Libatayo.  CT CAP- DEC 28th, 2023- Continued decrease in size of the RIGHT upper lobe pulmonary Nodule.; known hepatic metastatic lesion continues to decrease in size as well now at 5 mm. No new or progressive findings.   # Proceed with Single agent Libtayo maintenance- Labs today reviewed;  acceptable for treatment today.  DEC 2023- TSH-WNL>    # Impending right hip fracture right hip pain-status post radiation [[place]; DEC 2022]- S/p  Re-RT on 10/03 x5 Fx. NOV 2023-status post intramedullary nail fixation due to an impending femur fracture secondary  DEC 2023-bone scan no evidence of any distant bony disease; difficult to assess right femur because of recent surgery.  Discussed the role of Zometa to decrease skeletal related events [pain; fractures; need for radiation; hypercalcemia].  Discussed the potential side effects including but not limited to-infusion reaction; hypocalcemia; and Osteo-necrosis of jaw.  Recommend dental clearance.    # Low vit D- [July 2023- vit D- 25]; on  ergocalciferol- Calcium 8.8  improved/STABLE.  # Squamous cell carcinoma of the scalp-s/p excision; positive margins- on libtayo-no clinical evidence of progression.  Might need to consider radiation-if any local progression noted/also based on course lung cancer.  STABLE.    # GERD- on prilosec recommend PPI BID prior to meals- STABLE.     #Incidental findings on Imaging CT, 2023: RIGHT adrenal adenoma; Sigmoid diverticulosis 7. Caudate hypertrophy and fissural widening of hepatic fissures should be correlated with any clinical or laboratory evidence of liver disease. Findings reflect cirrhotic morphologic changes but without overt signs of portal hypertension; Aortic atherosclerosis.I reviewed/discussed/counseled the patient.   # IV access: PIV  #  DISPOSITION: #Libtayo;- treatment  Today. # as per IS- follow up in 3 weeks- MD;labs- cbc/cmp; Libtayo- Dr.B  # I reviewed the blood work- with the patient in detail; also reviewed the imaging independently [as summarized above]; and with the patient in detail.  '

## 2022-11-01 ENCOUNTER — Telehealth: Payer: Self-pay

## 2022-11-01 ENCOUNTER — Encounter: Payer: Self-pay | Admitting: Internal Medicine

## 2022-11-01 NOTE — Telephone Encounter (Signed)
MyChart message received requesting a referral be faxed to [facility] at [phone].  Dr. B approves of referral with reason: possible Cirrhosis on imaging  [facility] [place]: [phone]/[facility]: [phone]   Referral has been faxed.

## 2022-11-02 ENCOUNTER — Encounter: Payer: Self-pay | Admitting: *Deleted

## 2022-11-02 ENCOUNTER — Ambulatory Visit: Payer: BC Managed Care – PPO | Admitting: Physical Therapy

## 2022-11-02 NOTE — Telephone Encounter (Signed)
Josh fyi- pt having outpatient rehab and he will f/u there. Wife would like to cnl apts with maureen.

## 2022-11-06 ENCOUNTER — Encounter: Payer: Self-pay | Admitting: Internal Medicine

## 2022-11-06 ENCOUNTER — Ambulatory Visit: Payer: BC Managed Care – PPO | Attending: Radiation Oncology | Admitting: Physical Therapy

## 2022-11-06 ENCOUNTER — Encounter: Payer: Self-pay | Admitting: Physical Therapy

## 2022-11-06 DIAGNOSIS — M25551 Pain in right hip: Secondary | ICD-10-CM | POA: Insufficient documentation

## 2022-11-06 NOTE — Therapy (Addendum)
OUTPATIENT PHYSICAL THERAPY THORACOLUMBAR EVALUATION   Patient Name: Chris [name] MRN: [id number] DOB:07/31/1961, 62 y.o., male Today's Date: 11/07/2022   PT End of Session - 11/07/22 0925     Visit Number 1    Number of Visits 17    Date for PT Re-Evaluation 01/04/23    Authorization - Visit Number 1    Authorization - Number of Visits 10    PT Start Time 1000    PT Stop Time 1045    PT Time Calculation (min) 45 min    Activity Tolerance Patient tolerated treatment well    Behavior During Therapy [facility] for tasks assessed/performed              Past Medical History:  Diagnosis Date   Anxiety    associated with medical care,  worsened by difficulty hearing, does better with wife present   Complication of anesthesia    wife states very anxious may need pre sedation   Coronary artery disease    GERD (gastroesophageal reflux disease)    Hearing loss    mild per wife   Hearing loss    no hearing aids per wife   Hyperlipidemia    Hypertension    MI (myocardial infarction) (HCC) 04/17/2006   Acute inferolateral wall MI with cardiogenic shock and complete heart block   Primary adenocarcinoma of upper lobe of right lung (HCC)    Prostate cancer (HCC)    Tobacco abuse    Wears glasses    Wears partial dentures    Upper   Past Surgical History:  Procedure Laterality Date   BRONCHIAL BIOPSY  10/09/2021   Procedure: BRONCHIAL BIOPSIES;  Surgeon: Leslye Peer, MD;  Location: [facility];  Service: Pulmonary;;   BRONCHIAL BRUSHINGS  10/09/2021   Procedure: BRONCHIAL BRUSHINGS;  Surgeon: Leslye Peer, MD;  Location: [facility] ENDOSCOPY;  Service: Pulmonary;;   BRONCHIAL NEEDLE ASPIRATION BIOPSY  10/09/2021   Procedure: BRONCHIAL NEEDLE ASPIRATION BIOPSIES;  Surgeon: Leslye Peer, MD;  Location: [facility] ENDOSCOPY;  Service: Pulmonary;;   CARDIAC CATHETERIZATION  2007   left, RCA 100% occluded ruptured plaque with thrombus in the proximal segment   CYST EXCISION N/A  08/30/2021   Procedure: EXCISION OF POSTERIOR SCALP CYST;  Surgeon: Berna Bue, MD;  Location: [facility];  Service: General;  Laterality: N/A;   CYSTOSCOPY N/A 07/17/2018   Procedure: Bluford Kaufmann;  Surgeon: Marcine Matar, MD;  Location: [facility];  Service: Urology;  Laterality: N/A;  no seeds in bladder per Dr Retta Diones   FIDUCIAL MARKER PLACEMENT  10/09/2021   Procedure: FIDUCIAL MARKER PLACEMENT;  Surgeon: Leslye Peer, MD;  Location: [facility] ENDOSCOPY;  Service: Pulmonary;;   HAND SURGERY Right    has metal plate in arm   PROSTATE BIOPSY     RADIOACTIVE SEED IMPLANT N/A 07/17/2018   Procedure: RADIOACTIVE SEED IMPLANT/BRACHYTHERAPY IMPLANT;  Surgeon: Marcine Matar, MD;  Location: [facility];  Service: Urology;  Laterality: N/A;  77 seeds   SPACE OAR INSTILLATION N/A 07/17/2018   Procedure: SPACE OAR INSTILLATION;  Surgeon: Marcine Matar, MD;  Location: [facility];  Service: Urology;  Laterality: N/A;   VIDEO BRONCHOSCOPY WITH RADIAL ENDOBRONCHIAL ULTRASOUND  10/09/2021   Procedure: VIDEO BRONCHOSCOPY WITH RADIAL ENDOBRONCHIAL ULTRASOUND;  Surgeon: Leslye Peer, MD;  Location: [facility] ENDOSCOPY;  Service: Pulmonary;;   WRIST SURGERY  10/04/2012   Patient Active Problem List   Diagnosis Date Noted   Primary adenocarcinoma of upper  lobe of right lung (HCC) 10/18/2021   Encounter for antineoplastic chemotherapy 10/18/2021   Encounter for antineoplastic immunotherapy 10/18/2021   Right upper lobe pulmonary nodule 09/27/2021   Squamous cell carcinoma of scalp 09/13/2021   Tobacco abuse 04/08/2018   Malignant neoplasm of prostate (HCC) 03/25/2018   Coronary artery disease 10/27/2013   Hyperlipidemia 10/27/2013    PCP: Sherrie Mustache MD  REFERRING PROVIDER: Carmina Miller MD  REFERRING DIAG: R hip pain   Rationale for Evaluation and Treatment Rehabilitation  THERAPY DIAG:  Pain in right hip - Plan: PT plan of care  cert/re-cert  ONSET DATE: Dec 2022  SUBJECTIVE:                                                                                                                                                                                           SUBJECTIVE STATEMENT: R hip pain (at femur metaphyseal) d/t to metastatic lunge cancer.  PERTINENT HISTORY:  Pt is a 62 year old male presenting with non-small cell lung cancer/stage IV-favor adeno carcinoma-metastases to right femur/liver originally diagnosed Dec 2022- familiar with this clinic from evaluation 08/16/22, unable to return due to the following medical issues: radiation following that evaluation, and femur fracture 09/12/22 with IM placement 09/18/22. Fracture resulted from fall getting out of his truck. No chemo/radiation since surgery; is completing immunotherapy 1x/3weeks. Had HHPT for 4 weeks and has been walking with SPC, and exercises since (describes 3 way hip, glute squeeze). Has pain on lateral aspect of R hip best and current 6/10; worst: 8/10. Pain worsened by standing up from a chair, walking >80mins, lifting/carrying. Uses SPC "most of the time, bilat SPC in community and on stairs, and RW in home "sometimes". Would like to be able to ride his [address] [employer] by August.    PAIN:  Are you having pain? Yes: NPRS scale: 6/10 Pain location: R hip/femur Pain description: achy at baseline; sharp and grabbing at standing Aggravating factors: standing from any position; prolonged walking Relieving factors: rest   PRECAUTIONS: Fall  WEIGHT BEARING RESTRICTIONS: No  FALLS:  Has patient fallen in last 6 months? Yes stepping out of his truck- this visit  LIVING ENVIRONMENT: Lives with: lives with their spouse Lives in: House/apartment Stairs: Yes: External: 4 no handrail steps; none Has following equipment at home: Single point cane and Shower bench grab bars in shower and bathroom, walkin shower, raised toilet seat, RW  OCCUPATION: part time  re-po  PLOF: Independent  PATIENT GOALS: Be able to ride my harley safely by August   OBJECTIVE:   DIAGNOSTIC FINDINGS:    PATIENT SURVEYS:  FOTO 41 goal 26  SCREENING FOR RED FLAGS: Bowel or bladder incontinence: No Spinal tumors: No Cauda equina syndrome: No Compression fracture: Yes: possible Abdominal aneurysm: No  COGNITION:  Overall cognitive status: Within functional limits for tasks assessed     SENSATION: WFL  MUSCLE LENGTH: Hamstrings: Right shortened deg; Left WNL deg Maisie Fus test: Right shortened deg; Left WNL deg  POSTURE: rounded shoulders, forward head, increased thoracic kyphosis, and flexed trunk   PALPATION: TTP at proximal femur, greater trochanter, and at superior/lateral glute max insertion  LUMBAR ROM:   All lumbar AROM WNL without pain   LOWER EXTREMITY ROM:     Active  Right eval Left eval  Hip flexion 125d WNL  Hip extension 50% AROM; 75% PROM WNL  Hip abduction 0d AROM; near 100% PROM WNL  Hip adduction    Hip internal rotation 35 WNL  Hip external rotation 21 WNL  Knee flexion WNL WNL  Knee extension WNL WNL  Ankle dorsiflexion    Ankle plantarflexion    Ankle inversion    Ankle eversion     (Blank rows = not tested)  LOWER EXTREMITY MMT:    MMT Right eval Left eval  Hip flexion 3+ 5  Hip extension 4- 5  Hip abduction 3+ 5  Hip adduction    Hip internal rotation 3+ 5  Hip external rotation 4- 5  Knee flexion 4 5  Knee extension 4 5  Ankle dorsiflexion 4+ 5  Ankle plantarflexion    Ankle inversion    Ankle eversion     (Blank rows = not tested)  LUMBAR SPECIAL TESTS:  Maisie Fus test: Positive  FUNCTIONAL TESTS:  Sit <> Supine modI with difficulty with RLE lift, able to complete without use of UEs 5xSTS 28sec Fastest: 0.41m/s self selected: 0.13m/s  GAIT: Distance walked:  Assistive device utilized: Single point cane Level of assistance: SBA Comments: Amb with current method and with cane in LUE with  patient able to demonstrate good carry over of gait with SPC with LUE    TODAY'S TREATMENT:  [facility] Adult PT Treatment:                                                                                                                            DATE: 08/16/22  Ther-Ex  PT reviewed the following HEP with patient with patient able to demonstrate a set of the following with min cuing for correction needed. PT educated patient on parameters of therex (how/when to inc/decrease intensity, frequency, rep/set range, stretch hold time, and purpose of therex) with verbalized understanding.  Access Code: [email or web address]  - Supine Bridge  - 1-2 x weekly - 3 sets - 8-12 reps - Clamshell  - 1-2 x weekly - 3 sets - 8-12 reps - Mini Squat with Counter Support  - 1-2 x weekly - 3 sets - 8-12 reps - Standing Hip Abduction with Counter Support  - 1-2 x weekly - 3 sets - 8-12 reps  PATIENT EDUCATION:  Education details: Patient was educated on diagnosis, anatomy and pathology involved, prognosis, role of PT, and was given an HEP, demonstrating exercise with proper form following verbal and tactile cues, and was given a paper hand out to continue exercise at home. Pt was educated on and agreed to plan of care.  Gait training with cane in LUE Person educated: Patient and Spouse Education method: Explanation, Demonstration, and Verbal cues Education comprehension: verbalized understanding, returned demonstration, and verbal cues required   HOME EXERCISE PROGRAM: PT reviewed the following HEP with patient with patient able to demonstrate a set of the following with min cuing for correction needed. PT educated patient on parameters of therex (how/when to inc/decrease intensity, frequency, rep/set range, stretch hold time, and purpose of therex) with verbalized understanding.  Thomas stretch 30sec 2-3x/day Hamstring stretch 2-3x/day Figure 4 stretch  2-3x/day  ASSESSMENT:  CLINICAL IMPRESSION: Patient is a 62 y.o. male  who was seen today for physical therapy evaluation and treatment s/p femur IM 09/18/22 following fracture from fall. Pertinent current non-small cell lung cancer/stage IV-favor adeno carcinoma-metastases to right femur/liver originally diagnosed Dec 2022.No longer undergoing chemo/radiation treatment for this. Impairments in decreased R hip strength, P/AROM, static balance, motor control, muscle tension/trigger points, and pain. Activity limitations in functional transferring (supine <> sit, STS, car transfers), community and household ambulation, lifting, and carrying; inhibiting full participation in self care and community ADLs. Would benefit from skilled PT to address above deficits and promote optimal return to PLOF.    OBJECTIVE IMPAIRMENTS: Abnormal gait, decreased activity tolerance, decreased balance, decreased coordination, decreased endurance, decreased knowledge of use of DME, decreased mobility, difficulty walking, decreased ROM, decreased strength, increased fascial restrictions, increased muscle spasms, impaired flexibility, impaired tone, improper body mechanics, postural dysfunction, and pain.   ACTIVITY LIMITATIONS: carrying, lifting, sitting, standing, squatting, stairs, transfers, bathing, hygiene/grooming, and locomotion level  PARTICIPATION LIMITATIONS: meal prep, cleaning, laundry, driving, community activity, occupation, and yard work  PERSONAL FACTORS: Age, Education, Fitness, Past/current experiences, and 3+ comorbidities: stage IV  lung cancer met to R femur, HLD, CAD  are also affecting patient's functional outcome.   REHAB POTENTIAL: Fair cancer prognosis  CLINICAL DECISION MAKING: Evolving/moderate complexity  EVALUATION COMPLEXITY: Moderate   GOALS: Goals reviewed with patient? Yes  SHORT TERM GOALS: Target date: 11/28/22  Pt will be independent with HEP in order to improve strength and balance in order to decrease fall risk and improve function at home and  work. Baseline:08/16/22 HEP given  Goal status: INITIAL   LONG TERM GOALS: Target date: 01/02/2023  Patient will increase FOTO score to 53 to demonstrate predicted increase in functional mobility to complete ADLs Baseline: 41 Goal status: INITIAL  2.  Pt and wife will demonstrate and verbalize understanding for maintenance HEP with 100% accuracy  Baseline: initial HEP given  Goal status: INITIAL  3. Pt will increase to at least 0.43m/s in order to demonstrate clinically significant household ambulation speed  Baseline: Fastest: 0.40m/s self selected: 0.49m/s Goal status: INITIAL  4.  Pt will decrease 5TSTS to at least 12 seconds in order to demonstrate clinically significant age matched norms in LE strength to complete transfers and stair negotiation independently and safely  Baseline: 11/06/22 28sec Goal status: INITIAL     PLAN: PT FREQUENCY: 1-2x/week  PT DURATION: 12 weeks  PLANNED INTERVENTIONS: Therapeutic exercises, Therapeutic activity, Neuromuscular re-education, Balance training, Gait training, Patient/Family education, Self Care, Joint mobilization, Joint manipulation, Dry Needling, Spinal manipulation, Spinal mobilization, Cryotherapy, Moist heat, Traction, Ultrasound,  Manual therapy, and Re-evaluation.  PLAN FOR NEXT SESSION: SLS time  Hilda Lias DPT  Hilda Lias, PT 11/07/2022, 9:40 AM

## 2022-11-07 ENCOUNTER — Encounter: Payer: Self-pay | Admitting: Internal Medicine

## 2022-11-07 ENCOUNTER — Ambulatory Visit: Payer: BC Managed Care – PPO | Admitting: Occupational Therapy

## 2022-11-07 ENCOUNTER — Encounter: Payer: Self-pay | Admitting: Physical Therapy

## 2022-11-08 ENCOUNTER — Other Ambulatory Visit: Payer: Self-pay | Admitting: Internal Medicine

## 2022-11-09 ENCOUNTER — Encounter: Payer: BC Managed Care – PPO | Admitting: Physical Therapy

## 2022-11-09 ENCOUNTER — Encounter: Payer: Self-pay | Admitting: Hospice and Palliative Medicine

## 2022-11-09 DIAGNOSIS — C3411 Malignant neoplasm of upper lobe, right bronchus or lung: Secondary | ICD-10-CM | POA: Diagnosis not present

## 2022-11-09 DIAGNOSIS — F17210 Nicotine dependence, cigarettes, uncomplicated: Secondary | ICD-10-CM | POA: Diagnosis not present

## 2022-11-09 DIAGNOSIS — Z9889 Other specified postprocedural states: Secondary | ICD-10-CM | POA: Diagnosis not present

## 2022-11-09 DIAGNOSIS — S7221XD Displaced subtrochanteric fracture of right femur, subsequent encounter for closed fracture with routine healing: Secondary | ICD-10-CM | POA: Diagnosis not present

## 2022-11-09 DIAGNOSIS — Z483 Aftercare following surgery for neoplasm: Secondary | ICD-10-CM | POA: Diagnosis not present

## 2022-11-09 DIAGNOSIS — X58XXXD Exposure to other specified factors, subsequent encounter: Secondary | ICD-10-CM | POA: Diagnosis not present

## 2022-11-12 ENCOUNTER — Other Ambulatory Visit: Payer: Self-pay

## 2022-11-12 DIAGNOSIS — C3411 Malignant neoplasm of upper lobe, right bronchus or lung: Secondary | ICD-10-CM

## 2022-11-14 ENCOUNTER — Ambulatory Visit: Payer: BC Managed Care – PPO | Admitting: Physical Therapy

## 2022-11-20 ENCOUNTER — Encounter: Payer: Self-pay | Admitting: Internal Medicine

## 2022-11-21 ENCOUNTER — Ambulatory Visit: Payer: BC Managed Care – PPO | Admitting: Physical Therapy

## 2022-11-21 ENCOUNTER — Inpatient Hospital Stay (HOSPITAL_BASED_OUTPATIENT_CLINIC_OR_DEPARTMENT_OTHER): Payer: BC Managed Care – PPO | Admitting: Internal Medicine

## 2022-11-21 ENCOUNTER — Inpatient Hospital Stay: Payer: BC Managed Care – PPO

## 2022-11-21 ENCOUNTER — Encounter: Payer: Self-pay | Admitting: Internal Medicine

## 2022-11-21 ENCOUNTER — Other Ambulatory Visit: Payer: Self-pay | Admitting: *Deleted

## 2022-11-21 VITALS — BP 150/98 | HR 69 | Temp 97.4°F | Resp 18 | Wt 233.0 lb

## 2022-11-21 DIAGNOSIS — C4442 Squamous cell carcinoma of skin of scalp and neck: Secondary | ICD-10-CM | POA: Diagnosis not present

## 2022-11-21 DIAGNOSIS — C787 Secondary malignant neoplasm of liver and intrahepatic bile duct: Secondary | ICD-10-CM | POA: Diagnosis not present

## 2022-11-21 DIAGNOSIS — C3411 Malignant neoplasm of upper lobe, right bronchus or lung: Secondary | ICD-10-CM

## 2022-11-21 DIAGNOSIS — Z79899 Other long term (current) drug therapy: Secondary | ICD-10-CM | POA: Diagnosis not present

## 2022-11-21 DIAGNOSIS — C7951 Secondary malignant neoplasm of bone: Secondary | ICD-10-CM | POA: Diagnosis not present

## 2022-11-21 DIAGNOSIS — Z5111 Encounter for antineoplastic chemotherapy: Secondary | ICD-10-CM | POA: Diagnosis not present

## 2022-11-21 DIAGNOSIS — K219 Gastro-esophageal reflux disease without esophagitis: Secondary | ICD-10-CM | POA: Diagnosis not present

## 2022-11-21 LAB — CBC WITH DIFFERENTIAL/PLATELET
Abs Immature Granulocytes: 0.01 10*3/uL (ref 0.00–0.07)
Basophils Absolute: 0 10*3/uL (ref 0.0–0.1)
Basophils Relative: 0 %
Eosinophils Absolute: 0.1 10*3/uL (ref 0.0–0.5)
Eosinophils Relative: 1 %
HCT: 46.2 % (ref 39.0–52.0)
Hemoglobin: 15.2 g/dL (ref 13.0–17.0)
Immature Granulocytes: 0 %
Lymphocytes Relative: 18 %
Lymphs Abs: 0.9 10*3/uL (ref 0.7–4.0)
MCH: 30.2 pg (ref 26.0–34.0)
MCHC: 32.9 g/dL (ref 30.0–36.0)
MCV: 91.7 fL (ref 80.0–100.0)
Monocytes Absolute: 0.6 10*3/uL (ref 0.1–1.0)
Monocytes Relative: 11 %
Neutro Abs: 3.6 10*3/uL (ref 1.7–7.7)
Neutrophils Relative %: 70 %
Platelets: 268 10*3/uL (ref 150–400)
RBC: 5.04 MIL/uL (ref 4.22–5.81)
RDW: 14.3 % (ref 11.5–15.5)
WBC: 5.2 10*3/uL (ref 4.0–10.5)
nRBC: 0 % (ref 0.0–0.2)

## 2022-11-21 LAB — COMPREHENSIVE METABOLIC PANEL
ALT: 17 U/L (ref 0–44)
AST: 22 U/L (ref 15–41)
Albumin: 4.1 g/dL (ref 3.5–5.0)
Alkaline Phosphatase: 72 U/L (ref 38–126)
Anion gap: 12 (ref 5–15)
BUN: 16 mg/dL (ref 8–23)
CO2: 26 mmol/L (ref 22–32)
Calcium: 9.5 mg/dL (ref 8.9–10.3)
Chloride: 104 mmol/L (ref 98–111)
Creatinine, Ser: 0.98 mg/dL (ref 0.61–1.24)
GFR, Estimated: >60 mL/min (ref >60)
Glucose, Bld: 117 mg/dL — ABNORMAL HIGH (ref 70–99)
Potassium: 3.8 mmol/L (ref 3.5–5.1)
Sodium: 142 mmol/L (ref 135–145)
Total Bilirubin: 0.5 mg/dL (ref 0.3–1.2)
Total Protein: 7.6 g/dL (ref 6.5–8.1)

## 2022-11-21 MED ORDER — SODIUM CHLORIDE 0.9 % IV SOLN
350.0000 mg | Freq: Once | INTRAVENOUS | Status: AC
  Administered 2022-11-21: 350 mg via INTRAVENOUS
  Filled 2022-11-21: qty 7

## 2022-11-21 MED ORDER — SODIUM CHLORIDE 0.9 % IV SOLN
Freq: Once | INTRAVENOUS | Status: AC
  Filled 2022-11-21: qty 250

## 2022-11-21 NOTE — Assessment & Plan Note (Addendum)
#  Non-small cell lung cancer/stage IV-favor adeno carcinoma-metastases to right femur/liver; synchronous squamous cell carcinoma scalp  S/P- Patient currently on maintanence Libatayo.  CT CAP- DEC 28th, 2023- Continued decrease in size of the RIGHT upper lobe pulmonary Nodule.; known hepatic metastatic lesion continues to decrease in size as well now at 5 mm. No new or progressive findings.   # Proceed with Single agent Libtayo maintenance- Labs today reviewed;  acceptable for treatment today.  DEC 2023- TSH-WNL. Stable.  Will repeat imaging in March end.    # Impending right hip fracture right hip pain-status post radiation [[place]; DEC 2022]- S/p  Re-RT on 10/03 x5 Fx. NOV 2023-status post intramedullary nail fixation due to an impending femur fracture secondary  DEC 2023-bone scan no evidence of any distant bony disease; difficult to assess right femur because of recent surgery.  Discussed the role of Zometa to decrease skeletal related events [pain; fractures; need for radiation; hypercalcemia].  Discussed the potential side effects including but not limited to-infusion reaction; hypocalcemia; and Osteo-necrosis of jaw.  Awaiting ental clearance.  Stable.   # Low vit D- [July 2023- vit D- 25]; on  ergocalciferol- Calcium 9.5 improved/stable. Will repeat vit D-25-OH.   # Squamous cell carcinoma of the scalp-s/p excision; positive margins- on libtayo-no clinical evidence of progression.  Might need to consider radiation-if any local progression noted/also based on course lung cancer.  Stable.     # GERD- on prilosec recommend PPI BID prior to meals- stable     # CT scan, JAN 2024- incidental-Caudate hypertrophy and fissural widening of hepatic fissures should be correlated with any clinical or laboratory evidence of liver disease. Findings reflect cirrhotic morphologic changes but without overt signs of portal hypertension. Currently awaiting on GI referral evaluation.   # IV access: PIV  #  DISPOSITION: #Libtayo;- treatment  Today. # as per IS- follow up in 3 weeks- MD;labs- cbc/cmp; vit D-25-OH. Libtayo- Dr.B

## 2022-11-21 NOTE — Progress Notes (Signed)
[facility] CONSULT NOTE  Patient Care Team: Casilda Carls, MD as PCP - General (Internal Medicine) Lorretta Harp, MD as PCP - Cardiology (Cardiology) Curt Bears, MD as Consulting Physician (Oncology) Casilda Carls, MD as Referring Physician (Internal Medicine) Cammie Sickle, MD as Consulting Physician (Internal Medicine) Cammie Sickle, MD as Consulting Physician (Internal Medicine)  CHIEF COMPLAINTS/PURPOSE OF CONSULTATION: lung cancer  #  Oncology History Overview Note  DIAGNOSIS: 1) stage IV (T1c, N0, M1 C) non-small cell lung cancer favoring adenocarcinoma presented with right lung apical nodule in addition to metastatic disease in the right hepatic lobe and the proximal right femoral diaphysis diagnosed and December 2022. 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    QNS; Guardant 360 showed positive KRAS G12C mutation  FINAL MICROSCOPIC DIAGNOSIS:   A. LUNG, RUL, FINE NEEDLE ASPIRATION:  - Malignant cells consistent with non-small cell carcinoma, see comment   B. LUNG, RUL, BRUSHING:  - Malignant cells consistent with non-small cell carcinoma, see comment       COMMENT:   A and B.  Dr. Saralyn Pilar reviewed the case and concurs with the diagnosis.  Only rare malignant cells are present on the cellblock.  Immunohistochemical stains were attempted and show that the tumor cells  have patchy staining for TTF-1 whereas p63, p40 and CK5/6 are negative.  The findings are nondiagnostic but suggestive of a lung adenocarcinoma.  Dr. Lamonte Sakai was notified on 10/13/2021.   SEP-OCT 2022-  [Dermatology]-   Poorly differentiated squamous cell carcinoma; -  Carcinoma extends to the edges of the excision specimen   IMPRESSION: 1. The spiculated nodule at the right lung apex on recent neck CT is hypermetabolic and is concerning for primary bronchogenic  carcinoma. Tissue sampling recommended. 2. Hypermetabolic activity within the occipital scalp and small previously demonstrated right occipital lymph node compatible with known squamous cell carcinoma. Evaluation of the head and neck limited by motion artifact. 3. Hypermetabolic lesions inferiorly in the right hepatic lobe and in the proximal right femoral diaphysis suspicious for metastatic disease, primary uncertain in this patient with a history of prostate cancer. Correlate with PSA levels. Abdominal MRI without and with contrast may be helpful for further characterization of the liver lesion.  # s/p RT tto RUL [[place]]; right hip RT;   # 2007MI-CAD [s/p stent-Dr.Berry; EF 2021- 58%]     Primary adenocarcinoma of upper lobe of right lung ([place])  10/18/2021 Initial Diagnosis   Primary adenocarcinoma of upper lobe of right lung ([place])   10/18/2021 Cancer Staging   Staging form: Lung, AJCC 8th Edition - Clinical: Stage IVB (cT1c, cN0, cM1c) - Signed by Curt Bears, MD on 10/18/2021   11/01/2021 - 05/30/2022 Chemotherapy   Patient is on Treatment Plan : LUNG NSCLC Pemetrexed + Carboplatin q21d x 4 Cycles     11/01/2021 -  Chemotherapy   Patient is on Treatment Plan : LUNG NSCLC Libtayo q  21d       HISTORY OF PRESENTING ILLNESS: Ambulating with a cane.  Accompanied by his wife.   [name] 62 y.o.  male metastatic stage IV non-small cell lung cancer/favor adenocarcinoma-currently on maintained libatyo here for follow-up.  Patient denies new problems/concerns today.  BP 157/99 on 1st check 140/98, HR 69 on 2nd check.   Patient is recovering well from his recent right hip surgery for impending fracture. He is currently walking with a cane.  No falls.  Denies any fever chills.  No nausea no vomiting. Patient not taking tramadol because of concern for constipation.  Currently on meloxicam.  Currently stopped physical therapy; currently getting messages- muscle relaxants.   Review  of Systems  Constitutional:  Positive for malaise/fatigue. Negative for chills, diaphoresis, fever and weight loss.  HENT:  Positive for hearing loss. Negative for nosebleeds and sore throat.   Eyes:  Negative for double vision.  Respiratory:  Negative for cough, hemoptysis, sputum production, shortness of breath and wheezing.   Cardiovascular:  Negative for chest pain, palpitations, orthopnea and leg swelling.  Gastrointestinal:  Negative for abdominal pain, blood in stool, diarrhea, heartburn, melena, nausea and vomiting.  Genitourinary:  Negative for dysuria, frequency and urgency.  Musculoskeletal:  Negative for back pain.  Skin: Negative.  Negative for itching and rash.  Neurological:  Negative for dizziness, tingling, focal weakness, weakness and headaches.  Endo/Heme/Allergies:  Does not bruise/bleed easily.  Psychiatric/Behavioral:  Negative for depression. The patient does not have insomnia.      MEDICAL HISTORY:  Past Medical History:  Diagnosis Date   Anxiety    associated with medical care,  worsened by difficulty hearing, does better with wife present   Complication of anesthesia    wife states very anxious may need pre sedation   Coronary artery disease    GERD (gastroesophageal reflux disease)    Hearing loss    mild per wife   Hearing loss    no hearing aids per wife   Hyperlipidemia    Hypertension    MI (myocardial infarction) ([place]) 04/17/2006   Acute inferolateral wall MI with cardiogenic shock and complete heart block   Primary adenocarcinoma of upper lobe of right lung ([place])    Prostate cancer ([place])    Tobacco abuse    Wears glasses    Wears partial dentures    Upper    SURGICAL HISTORY: Past Surgical History:  Procedure Laterality Date   BRONCHIAL BIOPSY  10/09/2021   Procedure: BRONCHIAL BIOPSIES;  Surgeon: Collene Gobble, MD;  Location: [facility];  Service: Pulmonary;;   BRONCHIAL BRUSHINGS  10/09/2021   Procedure: BRONCHIAL BRUSHINGS;   Surgeon: Collene Gobble, MD;  Location[facility] ENDOSCOPY;  Service: Pulmonary;;   BRONCHIAL NEEDLE ASPIRATION BIOPSY  10/09/2021   Procedure: BRONCHIAL NEEDLE ASPIRATION BIOPSIES;  Surgeon: Collene Gobble, MD;  Location: [facility] ENDOSCOPY;  Service: Pulmonary;;   CARDIAC CATHETERIZATION  2007   left, RCA 100% occluded ruptured plaque with thrombus in the proximal segment   CYST EXCISION N/A 08/30/2021   Procedure: EXCISION OF POSTERIOR SCALP CYST;  Surgeon: Clovis Riley, MD;  Location: [facility] ORS;  Service: General;  Laterality: N/A;   CYSTOSCOPY N/A 07/17/2018   Procedure: Consuela Mimes;  Surgeon: Franchot Gallo, MD;  Location: [facility];  Service: Urology;  Laterality: N/A;  no seeds in bladder per Dr Diona Fanti   FIDUCIAL MARKER PLACEMENT  10/09/2021   Procedure: FIDUCIAL MARKER PLACEMENT;  Surgeon: Collene Gobble, MD;  Location[facility] ENDOSCOPY;  Service: Pulmonary;;   HAND SURGERY Right    has metal plate in arm   PROSTATE BIOPSY     RADIOACTIVE SEED IMPLANT N/A 07/17/2018   Procedure: RADIOACTIVE SEED IMPLANT/BRACHYTHERAPY IMPLANT;  Surgeon: Franchot Gallo, MD;  Location: [facility];  Service: Urology;  Laterality: N/A;  77 seeds   SPACE OAR INSTILLATION N/A 07/17/2018   Procedure: SPACE OAR INSTILLATION;  Surgeon: Franchot Gallo, MD;  Location: [facility];  Service:  Urology;  Laterality: N/A;   VIDEO BRONCHOSCOPY WITH RADIAL ENDOBRONCHIAL ULTRASOUND  10/09/2021   Procedure: VIDEO BRONCHOSCOPY WITH RADIAL ENDOBRONCHIAL ULTRASOUND;  Surgeon: Collene Gobble, MD;  Location: [facility] ENDOSCOPY;  Service: Pulmonary;;   WRIST SURGERY  10/04/2012    SOCIAL HISTORY: Social History   Socioeconomic History   Marital status: Married    Spouse name: Not on file   Number of children: Not on file   Years of education: Not on file   Highest education level: Not on file  Occupational History   Not on file  Tobacco Use   Smoking status: Every Day     Packs/day: 1.50    Years: 42.00    Total pack years: 63.00    Types: Cigarettes   Smokeless tobacco: Never  Vaping Use   Vaping Use: Never used  Substance and Sexual Activity   Alcohol use: No   Drug use: No   Sexual activity: Yes    Birth control/protection: None  Other Topics Concern   Not on file  Social History Narrative   10-04 19 Unable to ask abuse questions wife with him today.   Social Determinants of Health   Financial Resource Strain: Not on file  Food Insecurity: Not on file  Transportation Needs: Not on file  Physical Activity: Not on file  Stress: Not on file  Social Connections: Not on file  Intimate Partner Violence: Not on file    FAMILY HISTORY: Family History  Problem Relation Age of Onset   Breast cancer Mother    Prostate cancer Neg Hx    Kidney cancer Neg Hx    Cancer Neg Hx     ALLERGIES:  has No Known Allergies.  MEDICATIONS:  Current Outpatient Medications  Medication Sig Dispense Refill   ALPRAZolam (XANAX) 0.25 MG tablet Take 1 tablet (0.25 mg total) by mouth at bedtime. 15 tablet 0   atorvastatin (LIPITOR) 20 MG tablet Take 1 tablet (20 mg total) by mouth daily. 90 tablet 3   clopidogrel (PLAVIX) 75 MG tablet Take 1 tablet (75 mg total) by mouth daily. 90 tablet 3   cyclobenzaprine (FLEXERIL) 10 MG tablet Take 1 tablet by mouth 3 (three) times daily as needed.     ibuprofen (ADVIL) 800 MG tablet Take 800 mg by mouth every 8 (eight) hours as needed.     ketoconazole (NIZORAL) 2 % cream Apply 1 application. topically 2 (two) times daily as needed for irritation.     metoprolol tartrate (LOPRESSOR) 25 MG tablet Take 1 tablet (25 mg total) by mouth daily. 90 tablet 3   pantoprazole (PROTONIX) 40 MG tablet Take 1 tablet (40 mg total) by mouth daily. 60 tablet 1   prochlorperazine (COMPAZINE) 10 MG tablet Take 1 tablet (10 mg total) by mouth every 6 (six) hours as needed for nausea or vomiting. 30 tablet 1   sennosides-docusate sodium  (SENOKOT-S) 8.6-50 MG tablet Take 1 tablet by mouth daily.     sertraline (ZOLOFT) 100 MG tablet TAKE 1 TABLET BY MOUTH EVERY DAY 90 tablet 0   Sodium Borate POWD Take 1 tablet by mouth daily at 12 noon.     tamsulosin (FLOMAX) 0.4 MG CAPS capsule Take 1 capsule (0.4 mg total) by mouth daily. 30 capsule 3   traMADol (ULTRAM) 50 MG tablet Take 1 tablet (50 mg total) by mouth every 6 (six) hours as needed. 30 tablet 0   Vitamin D, Ergocalciferol, (DRISDOL) 1.25 MG (50000 UNIT) CAPS capsule TAKE 1 CAPSULE  BY MOUTH ONE TIME PER WEEK 12 capsule 1   Vitamin D, Ergocalciferol, 50000 units CAPS Take 1 capsule by mouth once a week.     meloxicam (MOBIC) 15 MG tablet Take 1 tablet (15 mg total) by mouth daily. 30 tablet 2   No current facility-administered medications for this visit.      Marland Kitchen  PHYSICAL EXAMINATION: ECOG PERFORMANCE STATUS: 1 - Symptomatic but completely ambulatory  Vitals:   11/21/22 0900  BP: (!) 150/98  Pulse: 69  Resp: 18  Temp: (!) 97.4 F (36.3 C)  SpO2: 98%     Filed Weights   11/21/22 0900  Weight: 233 lb (105.7 kg)      Physical Exam Vitals and nursing note reviewed.  HENT:     Head: Normocephalic and atraumatic.     Mouth/Throat:     Pharynx: Oropharynx is clear.  Eyes:     Extraocular Movements: Extraocular movements intact.     Pupils: Pupils are equal, round, and reactive to light.  Cardiovascular:     Rate and Rhythm: Normal rate and regular rhythm.  Pulmonary:     Comments: Decreased breath sounds bilaterally.  Abdominal:     Palpations: Abdomen is soft.  Musculoskeletal:        General: Normal range of motion.     Cervical back: Normal range of motion.  Skin:    General: Skin is warm.  Neurological:     General: No focal deficit present.     Mental Status: He is alert and oriented to person, place, and time.  Psychiatric:        Behavior: Behavior normal.        Judgment: Judgment normal.      LABORATORY DATA:  I have reviewed the  data as listed Lab Results  Component Value Date   WBC 5.2 11/21/2022   HGB 15.2 11/21/2022   HCT 46.2 11/21/2022   MCV 91.7 11/21/2022   PLT 268 11/21/2022   Recent Labs    10/10/22 1046 10/31/22 0912 11/21/22 0900  NA 139 137 142  K 3.9 3.8 3.8  CL 106 104 104  CO2 24 24 26   GLUCOSE 130* 115* 117*  BUN 21 19 16   CREATININE 1.06 0.96 0.98  CALCIUM 8.9 8.8* 9.5  GFRNONAA >60 >60 >60  PROT 7.2 7.7 7.6  ALBUMIN 3.9 4.2 4.1  AST 22 21 22   ALT 17 21 17   ALKPHOS 67 70 72  BILITOT 0.6 0.5 0.5    RADIOGRAPHIC STUDIES: I have personally reviewed the radiological images as listed and agreed with the findings in the report. CT CHEST ABDOMEN PELVIS W CONTRAST  Result Date: 10/30/2022 CLINICAL DATA:  A 62 year old male presents for evaluation of non-small cell lung cancer with history of metastatic disease, assess treatment response * Tracking Code: BO * EXAM: CT CHEST, ABDOMEN, AND PELVIS WITH CONTRAST TECHNIQUE: Multidetector CT imaging of the chest, abdomen and pelvis was performed following the standard protocol during bolus administration of intravenous contrast. RADIATION DOSE REDUCTION: This exam was performed according to the departmental dose-optimization program which includes automated exposure control, adjustment of the mA and/or kV according to patient size and/or use of iterative reconstruction technique. CONTRAST:  176mL OMNIPAQUE IOHEXOL 300 MG/ML  SOLN COMPARISON:  April 23, 2022 FINDINGS: CT CHEST FINDINGS Cardiovascular: Calcified aortic atherosclerotic changes. Noncalcified plaque, mild. Normal heart size without substantial pericardial effusion or nodularity. Normal caliber of central pulmonary vasculature, unremarkable on venous phase. Mediastinum/Nodes: No thoracic inlet, axillary, mediastinal  or hilar adenopathy. Esophagus grossly normal. Lungs/Pleura: Nodule in the RIGHT lung apex with surrounding mild septal thickening and ground-glass with decreased size at 13 x 8 mm  previously 17 x 10 mm in July, 17 x 10 mm also on the September PET evaluation. No new pulmonary nodules. No signs of pleural effusion or consolidative process. Airways are patent. Musculoskeletal: See below for full musculoskeletal details. No chest wall mass. CT ABDOMEN PELVIS FINDINGS Hepatobiliary: Caudate hypertrophy and lobular contour of the caudate lobe. Fissural widening. Portal vein is patent. No pericholecystic stranding or biliary duct dilation. 5 mm hepatic subsegment VI lesion at the site of previous hepatic metastasis. Measuring up to 14 mm on prior imaging. Pancreas: Normal, without mass, inflammation or ductal dilatation. Spleen: Normal. Adrenals/Urinary Tract: RIGHT adrenal gland with stable size and homogeneous nodule at 2.3 cm previously shown to represent an adrenal adenoma. No dedicated imaging follow-up is recommended for this finding. LEFT adrenal with mild nodularity, benign appearance. Renal enhancement is symmetric without hydronephrosis or perinephric stranding. No suspicious renal lesion. No perivesical stranding. Stomach/Bowel: No acute gastrointestinal findings. Normal appendix. Sigmoid diverticulosis. Vascular/Lymphatic: Calcified and noncalcified aortic atherosclerotic plaque is mild-to-moderate. There is no gastrohepatic or hepatoduodenal ligament lymphadenopathy. No retroperitoneal or mesenteric lymphadenopathy. No pelvic sidewall lymphadenopathy. Reproductive: Brachytherapy throughout the prostate. Other: No ascites. Musculoskeletal: Post RIGHT hip ORIF, incompletely evaluated. No acute musculoskeletal process or destructive bone findings. Spinal degenerative changes. IMPRESSION: 1. Continued decrease in size of the RIGHT upper lobe pulmonary nodule. 2. Known hepatic metastatic lesion continues to decrease in size as well now at 5 mm. 3. Post ORIF of the RIGHT proximal femur incompletely evaluated. 4. No new or progressive findings. 5. RIGHT adrenal adenoma. Based on current  clinical literature, biochemical evaluation to exclude possible functioning adrenal nodule is suggested if not already performed. Please refer to current clinical guidelines for detailed recommendations. NEJM 4481:856 154-51 6. Sigmoid diverticulosis without evidence of acute diverticulitis. 7. Caudate hypertrophy and fissural widening of hepatic fissures should be correlated with any clinical or laboratory evidence of liver disease. Findings reflect cirrhotic morphologic changes but without overt signs of portal hypertension. 8. Aortic atherosclerosis. Aortic Atherosclerosis (ICD10-I70.0). Electronically Signed   By: Zetta Bills M.D.   On: 10/30/2022 13:13   NM Bone Scan Whole Body  Result Date: 10/29/2022 CLINICAL DATA:  Metastatic lung cancer. EXAM: NUCLEAR MEDICINE WHOLE BODY BONE SCAN TECHNIQUE: Whole body anterior and posterior images were obtained approximately 3 hours after intravenous injection of radiopharmaceutical. RADIOPHARMACEUTICALS:  21.7 mCi Technetium-45m MDP IV COMPARISON:  Bone scan May 23, 2022. CT scan of the chest, abdomen, and pelvis October 23, 2022. FINDINGS: There is increased uptake throughout most of the right femur. There is a short segment of decreased uptake in the proximal diaphysis. No other scintigraphic evidence of bony metastatic disease. IMPRESSION: 1. There is diffuse uptake throughout most of the right femur with a small gap of decreased uptake in the proximal diaphysis. The increased uptake is likely a combination of the patient's known proximal femoral metastatic disease, recent fracture, and recent surgical fracture repair. 2. No other scintigraphic evidence of bony metastatic disease. Electronically Signed   By: Dorise Bullion III M.D.   On: 10/29/2022 11:58    ASSESSMENT & PLAN:   Primary adenocarcinoma of upper lobe of right lung ([place]) # Non-small cell lung cancer/stage IV-favor adeno carcinoma-metastases to right femur/liver; synchronous squamous cell  carcinoma scalp  S/P- Patient currently on maintanence Libatayo.  CT CAP- DEC 28th, 2023- Continued  decrease in size of the RIGHT upper lobe pulmonary Nodule.; known hepatic metastatic lesion continues to decrease in size as well now at 5 mm. No new or progressive findings.   # Proceed with Single agent Libtayo maintenance- Labs today reviewed;  acceptable for treatment today.  DEC 2023- TSH-WNL>    # Impending right hip fracture right hip pain-status post radiation [[place]; DEC 2022]- S/p  Re-RT on 10/03 x5 Fx. NOV 2023-status post intramedullary nail fixation due to an impending femur fracture secondary  DEC 2023-bone scan no evidence of any distant bony disease; difficult to assess right femur because of recent surgery.  Discussed the role of Zometa to decrease skeletal related events [pain; fractures; need for radiation; hypercalcemia].  Discussed the potential side effects including but not limited to-infusion reaction; hypocalcemia; and Osteo-necrosis of jaw.  Recommend dental clearance.    # Low vit D- [July 2023- vit D- 25]; on  ergocalciferol- Calcium 8.8  improved/STABLE.  # Squamous cell carcinoma of the scalp-s/p excision; positive margins- on libtayo-no clinical evidence of progression.  Might need to consider radiation-if any local progression noted/also based on course lung cancer.  STABLE.    # GERD- on prilosec recommend PPI BID prior to meals- STABLE.     # CT scan, JAN 2024- incidental-Caudate hypertrophy and fissural widening of hepatic fissures should be correlated with any clinical or laboratory evidence of liver disease. Findings reflect cirrhotic morphologic changes but without overt signs of portal hypertension. Currently awaiting on GI referral evaluation. ..   # IV access: PIV  # DISPOSITION: #Libtayo;- treatment  Today. # as per IS- follow up in 3 weeks- MD;labs- cbc/cmp; Libtayo- Dr.B          All questions were answered. The patient knows to call the clinic with  any problems, questions or concerns.       Cammie Sickle, MD 11/21/2022 9:39 AM

## 2022-11-21 NOTE — Patient Instructions (Signed)
[facility]  Discharge Instructions: Thank you for choosing [facility] to provide your oncology and hematology care.  If you have a lab appointment with the [facility], please go directly to the [facility] and check in at the registration area.  Wear comfortable clothing and clothing appropriate for easy access to any Portacath or PICC line.   We strive to give you quality time with your provider. You may need to reschedule your appointment if you arrive late (15 or more minutes).  Arriving late affects you and other patients whose appointments are after yours.  Also, if you miss three or more appointments without notifying the office, you may be dismissed from the clinic at the provider's discretion.      For prescription refill requests, have your pharmacy contact our office and allow 72 hours for refills to be completed.    Today you received the following chemotherapy and/or immunotherapy agents- Libtayo      To help prevent nausea and vomiting after your treatment, we encourage you to take your nausea medication as directed.  BELOW ARE SYMPTOMS THAT SHOULD BE REPORTED IMMEDIATELY: *FEVER GREATER THAN 100.4 F (38 C) OR HIGHER *CHILLS OR SWEATING *NAUSEA AND VOMITING THAT IS NOT CONTROLLED WITH YOUR NAUSEA MEDICATION *UNUSUAL SHORTNESS OF BREATH *UNUSUAL BRUISING OR BLEEDING *URINARY PROBLEMS (pain or burning when urinating, or frequent urination) *BOWEL PROBLEMS (unusual diarrhea, constipation, pain near the anus) TENDERNESS IN MOUTH AND THROAT WITH OR WITHOUT PRESENCE OF ULCERS (sore throat, sores in mouth, or a toothache) UNUSUAL RASH, SWELLING OR PAIN  UNUSUAL VAGINAL DISCHARGE OR ITCHING   Items with * indicate a potential emergency and should be followed up as soon as possible or go to the Emergency Department if any problems should occur.  Please show the CHEMOTHERAPY ALERT CARD or IMMUNOTHERAPY ALERT CARD at check-in to  the Emergency Department and triage nurse.  Should you have questions after your visit or need to cancel or reschedule your appointment, please contact [facility]  [phone] and follow the prompts.  Office hours are 8:00 a.m. to 4:30 p.m. Monday - Friday. Please note that voicemails left after 4:00 p.m. may not be returned until the following business day.  We are closed weekends and major holidays. You have access to a nurse at all times for urgent questions. Please call the main number to the clinic [phone] and follow the prompts.  For any non-urgent questions, you may also contact your provider using MyChart. We now offer e-Visits for anyone 65 and older to request care online for non-urgent symptoms. For details visit [email or web address].   Also download the MyChart app! Go to the app store, search "MyChart", open the app, select [facility], and log in with your MyChart username and password.

## 2022-11-21 NOTE — Progress Notes (Signed)
Patient denies new problems/concerns today.    BP 157/99 on 1st check 140/98, HR 69 on 2nd check

## 2022-11-22 ENCOUNTER — Encounter: Payer: Self-pay | Admitting: Internal Medicine

## 2022-11-22 LAB — MISC LABCORP TEST (SEND OUT): Labcorp test code: 81950

## 2022-11-23 ENCOUNTER — Other Ambulatory Visit: Payer: Self-pay | Admitting: Internal Medicine

## 2022-11-28 ENCOUNTER — Ambulatory Visit: Payer: BC Managed Care – PPO | Admitting: Physical Therapy

## 2022-12-04 ENCOUNTER — Encounter: Payer: Self-pay | Admitting: Cardiovascular Disease

## 2022-12-04 ENCOUNTER — Encounter: Payer: BC Managed Care – PPO | Admitting: Physical Therapy

## 2022-12-05 ENCOUNTER — Ambulatory Visit: Payer: BC Managed Care – PPO | Attending: Cardiovascular Disease | Admitting: Cardiovascular Disease

## 2022-12-05 ENCOUNTER — Encounter: Payer: Self-pay | Admitting: Cardiovascular Disease

## 2022-12-05 VITALS — BP 146/90 | HR 79 | Wt 236.0 lb

## 2022-12-05 DIAGNOSIS — E782 Mixed hyperlipidemia: Secondary | ICD-10-CM | POA: Diagnosis not present

## 2022-12-05 DIAGNOSIS — I2510 Atherosclerotic heart disease of native coronary artery without angina pectoris: Secondary | ICD-10-CM

## 2022-12-05 DIAGNOSIS — Z720 Tobacco use: Secondary | ICD-10-CM | POA: Diagnosis not present

## 2022-12-05 NOTE — Progress Notes (Addendum)
12/05/2022 Chris [name]   1961/06/05  [id number]  Primary Physician Casilda Carls, MD Primary Cardiologist: Lorretta Harp MD FACP, [place], [place], [place]  HPI:  Chris [name] is a 62 y.o.  mildly overweight married Caucasian male, father of 1 son, grandfather 2 to grandchildren (twin boys), whom I saw the office 03/20/2022...  He works half-time doing reposition.  He has a history of CAD, status post acute inferior wall myocardial infarction with RV infarct physiology on April 17, 2006. I took him to the cath lab at midnight that day and opened up an occluded RCA with overlapping Cypher drug-eluting stents. Circumflex and LAD were free of significant disease. His EF was 40% to 45% with moderate inferior hypokinesia. Subsequent echocardiogram revealed normal LV function with borderline inferior hypokinesia, and a Myoview performed  was not ischemic. His other problems include hyperlipidemia and continued tobacco abuse of  1/2 pack/day recalcitrant to risk factor modification..   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  He has gotten chemotherapy radiation therapy and immunotherapy.  He does continue to smoke.  He denies chest pain or shortness of breath..  He apparently had fracture of his right femur probably related to metastasis and radiation therapy requiring orthopedic surgery at [facility].  He was wheelchair Bound for the last 3 months but is now getting PT and walking with a cane.  He is looking forward to going to [place] in [place].  He was an active motorcycle rider.   Current Meds  Medication Sig   ALPRAZolam (XANAX) 0.25 MG tablet Take 1 tablet (0.25 mg total) by mouth at bedtime.   atorvastatin (LIPITOR) 20 MG tablet Take 1 tablet (20 mg total) by mouth daily.   clopidogrel (PLAVIX) 75 MG tablet Take 1 tablet (75 mg total) by mouth daily.   cyclobenzaprine (FLEXERIL) 10 MG tablet  Take 1 tablet by mouth 3 (three) times daily as needed.   ibuprofen (ADVIL) 800 MG tablet Take 800 mg by mouth every 8 (eight) hours as needed.   ketoconazole (NIZORAL) 2 % cream Apply 1 application. topically 2 (two) times daily as needed for irritation.   metoprolol tartrate (LOPRESSOR) 25 MG tablet Take 1 tablet (25 mg total) by mouth daily.   pantoprazole (PROTONIX) 40 MG tablet Take 1 tablet (40 mg total) by mouth daily.   prochlorperazine (COMPAZINE) 10 MG tablet Take 1 tablet (10 mg total) by mouth every 6 (six) hours as needed for nausea or vomiting.   sertraline (ZOLOFT) 100 MG tablet TAKE 1 TABLET BY MOUTH EVERY DAY   tamsulosin (FLOMAX) 0.4 MG CAPS capsule Take 1 capsule (0.4 mg total) by mouth daily.   traMADol (ULTRAM) 50 MG tablet Take 1 tablet (50 mg total) by mouth every 6 (six) hours as needed.   Vitamin D, Ergocalciferol, (DRISDOL) 1.25 MG (50000 UNIT) CAPS capsule TAKE 1 CAPSULE BY MOUTH ONE TIME PER WEEK     No Known Allergies  Social History   Socioeconomic History   Marital status: Married    Spouse name: Not on file   Number of children: Not on file   Years of education: Not on file   Highest education level: Not on file  Occupational History   Not on file  Tobacco Use   Smoking status: Every Day    Packs/day: 1.50    Years: 42.00  Total pack years: 63.00    Types: Cigarettes   Smokeless tobacco: Never  Vaping Use   Vaping Use: Never used  Substance and Sexual Activity   Alcohol use: No   Drug use: No   Sexual activity: Yes    Birth control/protection: None  Other Topics Concern   Not on file  Social History Narrative   10-04 19 Unable to ask abuse questions wife with him today.   Social Determinants of Health   Financial Resource Strain: Not on file  Food Insecurity: Not on file  Transportation Needs: Not on file  Physical Activity: Not on file  Stress: Not on file  Social Connections: Not on file  Intimate Partner Violence: Not on file      Review of Systems: General: negative for chills, fever, night sweats or weight changes.  Cardiovascular: negative for chest pain, dyspnea on exertion, edema, orthopnea, palpitations, paroxysmal nocturnal dyspnea or shortness of breath Dermatological: negative for rash Respiratory: negative for cough or wheezing Urologic: negative for hematuria Abdominal: negative for nausea, vomiting, diarrhea, bright red blood per rectum, melena, or hematemesis Neurologic: negative for visual changes, syncope, or dizziness All other systems reviewed and are otherwise negative except as noted above.    Blood pressure (!) 146/90, pulse 79, weight 236 lb (107 kg).  General appearance: alert and no distress Neck: no adenopathy, no carotid bruit, no JVD, supple, symmetrical, trachea midline, and thyroid not enlarged, symmetric, no tenderness/mass/nodules Lungs: clear to auscultation bilaterally Heart: regular rate and rhythm, S1, S2 normal, no murmur, click, rub or gallop Extremities: extremities normal, atraumatic, no cyanosis or edema Pulses: 2+ and symmetric Skin: Skin color, texture, turgor normal. No rashes or lesions Neurologic: Grossly normal  EKG not performed today  ASSESSMENT AND PLAN:   Coronary artery disease History of CAD status post acute inferior wall myocardial infarction with RV physiology 04/17/2006.  I took him to the Cath Lab at midnight opening up an occluded RCA with overlapping Cypher drug-eluting stents.  Her circumflex and LAD were free of disease.  His EF was 40 to 45% with moderate inferior hypokinesia.  Subsequent echo showed normal LV function.  He denies chest pain or shortness of breath.  Hyperlipidemia History of hyperlipidemia on atorvastatin with lipid profile performed 04/04/2022 revealing total cholesterol 148, LDL 75 and HDL 39.  Tobacco abuse History of ongoing tobacco abuse despite having metastatic lung cancer recalcitrant to resector  modification.     Lorretta Harp MD FACP,FACC,FAHA, [facility] 12/05/2022 10:22 AM

## 2022-12-05 NOTE — Assessment & Plan Note (Signed)
History of CAD status post acute inferior wall myocardial infarction with RV physiology 04/17/2006.  I took him to the Cath Lab at midnight opening up an occluded RCA with overlapping Cypher drug-eluting stents.  Her circumflex and LAD were free of disease.  His EF was 40 to 45% with moderate inferior hypokinesia.  Subsequent echo showed normal LV function.  He denies chest pain or shortness of breath.

## 2022-12-05 NOTE — Assessment & Plan Note (Signed)
History of ongoing tobacco abuse despite having metastatic lung cancer recalcitrant to resector modification.

## 2022-12-05 NOTE — Assessment & Plan Note (Signed)
History of hyperlipidemia on atorvastatin with lipid profile performed 04/04/2022 revealing total cholesterol 148, LDL 75 and HDL 39.

## 2022-12-05 NOTE — Patient Instructions (Signed)

## 2022-12-06 ENCOUNTER — Encounter: Payer: BC Managed Care – PPO | Admitting: Physical Therapy

## 2022-12-11 ENCOUNTER — Encounter: Payer: BC Managed Care – PPO | Admitting: Physical Therapy

## 2022-12-12 ENCOUNTER — Other Ambulatory Visit: Payer: BC Managed Care – PPO

## 2022-12-12 ENCOUNTER — Ambulatory Visit: Payer: BC Managed Care – PPO

## 2022-12-12 ENCOUNTER — Ambulatory Visit: Payer: BC Managed Care – PPO | Admitting: Internal Medicine

## 2022-12-18 ENCOUNTER — Encounter: Payer: BC Managed Care – PPO | Admitting: Physical Therapy

## 2022-12-19 ENCOUNTER — Inpatient Hospital Stay: Payer: BC Managed Care – PPO | Attending: Internal Medicine

## 2022-12-19 ENCOUNTER — Inpatient Hospital Stay: Payer: BC Managed Care – PPO

## 2022-12-19 ENCOUNTER — Inpatient Hospital Stay (HOSPITAL_BASED_OUTPATIENT_CLINIC_OR_DEPARTMENT_OTHER): Payer: BC Managed Care – PPO | Admitting: Internal Medicine

## 2022-12-19 VITALS — BP 142/74 | HR 69 | Temp 98.4°F | Resp 16 | Wt 237.5 lb

## 2022-12-19 DIAGNOSIS — C7951 Secondary malignant neoplasm of bone: Secondary | ICD-10-CM | POA: Insufficient documentation

## 2022-12-19 DIAGNOSIS — C3411 Malignant neoplasm of upper lobe, right bronchus or lung: Secondary | ICD-10-CM

## 2022-12-19 DIAGNOSIS — C4442 Squamous cell carcinoma of skin of scalp and neck: Secondary | ICD-10-CM | POA: Diagnosis not present

## 2022-12-19 DIAGNOSIS — F17210 Nicotine dependence, cigarettes, uncomplicated: Secondary | ICD-10-CM | POA: Insufficient documentation

## 2022-12-19 DIAGNOSIS — F419 Anxiety disorder, unspecified: Secondary | ICD-10-CM | POA: Diagnosis not present

## 2022-12-19 DIAGNOSIS — C787 Secondary malignant neoplasm of liver and intrahepatic bile duct: Secondary | ICD-10-CM | POA: Diagnosis not present

## 2022-12-19 DIAGNOSIS — Z5111 Encounter for antineoplastic chemotherapy: Secondary | ICD-10-CM | POA: Diagnosis not present

## 2022-12-19 DIAGNOSIS — Z79899 Other long term (current) drug therapy: Secondary | ICD-10-CM | POA: Diagnosis not present

## 2022-12-19 LAB — CBC WITH DIFFERENTIAL/PLATELET
Abs Immature Granulocytes: 0.03 10*3/uL (ref 0.00–0.07)
Basophils Absolute: 0 10*3/uL (ref 0.0–0.1)
Basophils Relative: 1 %
Eosinophils Absolute: 0.1 10*3/uL (ref 0.0–0.5)
Eosinophils Relative: 1 %
HCT: 47.4 % (ref 39.0–52.0)
Hemoglobin: 15.3 g/dL (ref 13.0–17.0)
Immature Granulocytes: 1 %
Lymphocytes Relative: 15 %
Lymphs Abs: 0.9 10*3/uL (ref 0.7–4.0)
MCH: 29.4 pg (ref 26.0–34.0)
MCHC: 32.3 g/dL (ref 30.0–36.0)
MCV: 91 fL (ref 80.0–100.0)
Monocytes Absolute: 0.7 10*3/uL (ref 0.1–1.0)
Monocytes Relative: 11 %
Neutro Abs: 4.5 10*3/uL (ref 1.7–7.7)
Neutrophils Relative %: 71 %
Platelets: 224 10*3/uL (ref 150–400)
RBC: 5.21 MIL/uL (ref 4.22–5.81)
RDW: 13.9 % (ref 11.5–15.5)
WBC: 6.2 10*3/uL (ref 4.0–10.5)
nRBC: 0 % (ref 0.0–0.2)

## 2022-12-19 LAB — COMPREHENSIVE METABOLIC PANEL
ALT: 18 U/L (ref 0–44)
AST: 18 U/L (ref 15–41)
Albumin: 4.5 g/dL (ref 3.5–5.0)
Alkaline Phosphatase: 75 U/L (ref 38–126)
Anion gap: 10 (ref 5–15)
BUN: 22 mg/dL (ref 8–23)
CO2: 27 mmol/L (ref 22–32)
Calcium: 9.4 mg/dL (ref 8.9–10.3)
Chloride: 103 mmol/L (ref 98–111)
Creatinine, Ser: 1.12 mg/dL (ref 0.61–1.24)
GFR, Estimated: >60 mL/min (ref >60)
Glucose, Bld: 111 mg/dL — ABNORMAL HIGH (ref 70–99)
Potassium: 4.5 mmol/L (ref 3.5–5.1)
Sodium: 140 mmol/L (ref 135–145)
Total Bilirubin: 0.4 mg/dL (ref 0.3–1.2)
Total Protein: 7.8 g/dL (ref 6.5–8.1)

## 2022-12-19 LAB — VITAMIN D 25 HYDROXY (VIT D DEFICIENCY, FRACTURES): Vit D, 25-Hydroxy: 56.71 ng/mL (ref 30–100)

## 2022-12-19 MED ORDER — SODIUM CHLORIDE 0.9 % IV SOLN
350.0000 mg | Freq: Once | INTRAVENOUS | Status: AC
  Administered 2022-12-19: 350 mg via INTRAVENOUS
  Filled 2022-12-19: qty 7

## 2022-12-19 MED ORDER — SODIUM CHLORIDE 0.9 % IV SOLN
Freq: Once | INTRAVENOUS | Status: AC
  Filled 2022-12-19: qty 250

## 2022-12-19 NOTE — Progress Notes (Signed)
Doing well. Has chronic low back and right leg pain. Appetite is good. Breathing well. Chronic cough as pt still smokes.

## 2022-12-19 NOTE — Patient Instructions (Signed)
[facility]  Discharge Instructions: Thank you for choosing [facility] to provide your oncology and hematology care.  If you have a lab appointment with the [facility], please go directly to the [facility] and check in at the registration area.  Wear comfortable clothing and clothing appropriate for easy access to any Portacath or PICC line.   We strive to give you quality time with your provider. You may need to reschedule your appointment if you arrive late (15 or more minutes).  Arriving late affects you and other patients whose appointments are after yours.  Also, if you miss three or more appointments without notifying the office, you may be dismissed from the clinic at the provider's discretion.      For prescription refill requests, have your pharmacy contact our office and allow 72 hours for refills to be completed.    Today you received the following chemotherapy and/or immunotherapy agents- Libtayo      To help prevent nausea and vomiting after your treatment, we encourage you to take your nausea medication as directed.  BELOW ARE SYMPTOMS THAT SHOULD BE REPORTED IMMEDIATELY: *FEVER GREATER THAN 100.4 F (38 C) OR HIGHER *CHILLS OR SWEATING *NAUSEA AND VOMITING THAT IS NOT CONTROLLED WITH YOUR NAUSEA MEDICATION *UNUSUAL SHORTNESS OF BREATH *UNUSUAL BRUISING OR BLEEDING *URINARY PROBLEMS (pain or burning when urinating, or frequent urination) *BOWEL PROBLEMS (unusual diarrhea, constipation, pain near the anus) TENDERNESS IN MOUTH AND THROAT WITH OR WITHOUT PRESENCE OF ULCERS (sore throat, sores in mouth, or a toothache) UNUSUAL RASH, SWELLING OR PAIN  UNUSUAL VAGINAL DISCHARGE OR ITCHING   Items with * indicate a potential emergency and should be followed up as soon as possible or go to the Emergency Department if any problems should occur.  Please show the CHEMOTHERAPY ALERT CARD or IMMUNOTHERAPY ALERT CARD at check-in to  the Emergency Department and triage nurse.  Should you have questions after your visit or need to cancel or reschedule your appointment, please contact [facility]  [phone] and follow the prompts.  Office hours are 8:00 a.m. to 4:30 p.m. Monday - Friday. Please note that voicemails left after 4:00 p.m. may not be returned until the following business day.  We are closed weekends and major holidays. You have access to a nurse at all times for urgent questions. Please call the main number to the clinic [phone] and follow the prompts.  For any non-urgent questions, you may also contact your provider using MyChart. We now offer e-Visits for anyone 70 and older to request care online for non-urgent symptoms. For details visit [email or web address].   Also download the MyChart app! Go to the app store, search "MyChart", open the app, select [facility], and log in with your MyChart username and password.

## 2022-12-19 NOTE — Progress Notes (Signed)
[facility] CONSULT NOTE  Patient Care Team: Casilda Carls, MD as PCP - General (Internal Medicine) Lorretta Harp, MD as PCP - Cardiology (Cardiology) Curt Bears, MD as Consulting Physician (Oncology) Casilda Carls, MD as Referring Physician (Internal Medicine) Cammie Sickle, MD as Consulting Physician (Internal Medicine) Cammie Sickle, MD as Consulting Physician (Internal Medicine)  CHIEF COMPLAINTS/PURPOSE OF CONSULTATION: lung cancer  #  Oncology History Overview Note  DIAGNOSIS: 1) stage IV (T1c, N0, M1 C) non-small cell lung cancer favoring adenocarcinoma presented with right lung apical nodule in addition to metastatic disease in the right hepatic lobe and the proximal right femoral diaphysis diagnosed and December 2022. 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    QNS; Guardant 360 showed positive KRAS G12C mutation  FINAL MICROSCOPIC DIAGNOSIS:   A. LUNG, RUL, FINE NEEDLE ASPIRATION:  - Malignant cells consistent with non-small cell carcinoma, see comment   B. LUNG, RUL, BRUSHING:  - Malignant cells consistent with non-small cell carcinoma, see comment       COMMENT:   A and B.  Dr. Saralyn Pilar reviewed the case and concurs with the diagnosis.  Only rare malignant cells are present on the cellblock.  Immunohistochemical stains were attempted and show that the tumor cells  have patchy staining for TTF-1 whereas p63, p40 and CK5/6 are negative.  The findings are nondiagnostic but suggestive of a lung adenocarcinoma.  Dr. Lamonte Sakai was notified on 10/13/2021.   SEP-OCT 2022-  [Dermatology]-   Poorly differentiated squamous cell carcinoma; -  Carcinoma extends to the edges of the excision specimen   IMPRESSION: 1. The spiculated nodule at the right lung apex on recent neck CT is hypermetabolic and is concerning for primary bronchogenic  carcinoma. Tissue sampling recommended. 2. Hypermetabolic activity within the occipital scalp and small previously demonstrated right occipital lymph node compatible with known squamous cell carcinoma. Evaluation of the head and neck limited by motion artifact. 3. Hypermetabolic lesions inferiorly in the right hepatic lobe and in the proximal right femoral diaphysis suspicious for metastatic disease, primary uncertain in this patient with a history of prostate cancer. Correlate with PSA levels. Abdominal MRI without and with contrast may be helpful for further characterization of the liver lesion.  # s/p RT tto RUL [[place]]; right hip RT;   # 2007MI-CAD [s/p stent-Dr.Berry; EF 2021- 58%]     Primary adenocarcinoma of upper lobe of right lung ([facility])  10/18/2021 Initial Diagnosis   Primary adenocarcinoma of upper lobe of right lung ([facility])   10/18/2021 Cancer Staging   Staging form: Lung, AJCC 8th Edition - Clinical: Stage IVB (cT1c, cN0, cM1c) - Signed by Curt Bears, MD on 10/18/2021   11/01/2021 - 05/30/2022 Chemotherapy   Patient is on Treatment Plan : LUNG NSCLC Pemetrexed + Carboplatin q21d x 4 Cycles     11/01/2021 -  Chemotherapy   Patient is on Treatment Plan : LUNG NSCLC Libtayo q  21d       HISTORY OF PRESENTING ILLNESS: Ambulating with a cane.  Accompanied by his wife.   Chris [name] 62 y.o.  male metastatic stage IV non-small cell lung cancer/favor adenocarcinoma-currently on maintained libatyo here for follow-up.  Doing well. Appetite is good. Breathing well. Chronic cough as pt still smokes.  Denies any worsening pain. Has chronic low back and right leg pain.   Review of Systems  Constitutional:  Positive for malaise/fatigue. Negative for chills, diaphoresis, fever  and weight loss.  HENT:  Positive for hearing loss. Negative for nosebleeds and sore throat.   Eyes:  Negative for double vision.  Respiratory:  Negative for cough, hemoptysis, sputum production,  shortness of breath and wheezing.   Cardiovascular:  Negative for chest pain, palpitations, orthopnea and leg swelling.  Gastrointestinal:  Negative for abdominal pain, blood in stool, diarrhea, heartburn, melena, nausea and vomiting.  Genitourinary:  Negative for dysuria, frequency and urgency.  Musculoskeletal:  Negative for back pain.  Skin: Negative.  Negative for itching and rash.  Neurological:  Negative for dizziness, tingling, focal weakness, weakness and headaches.  Endo/Heme/Allergies:  Does not bruise/bleed easily.  Psychiatric/Behavioral:  Negative for depression. The patient does not have insomnia.      MEDICAL HISTORY:  Past Medical History:  Diagnosis Date   Anxiety    associated with medical care,  worsened by difficulty hearing, does better with wife present   Complication of anesthesia    wife states very anxious may need pre sedation   Coronary artery disease    GERD (gastroesophageal reflux disease)    Hearing loss    mild per wife   Hearing loss    no hearing aids per wife   Hyperlipidemia    Hypertension    MI (myocardial infarction) ([place]) 04/17/2006   Acute inferolateral wall MI with cardiogenic shock and complete heart block   Primary adenocarcinoma of upper lobe of right lung ([facility])    Prostate cancer ([facility])    Tobacco abuse    Wears glasses    Wears partial dentures    Upper    SURGICAL HISTORY: Past Surgical History:  Procedure Laterality Date   BRONCHIAL BIOPSY  10/09/2021   Procedure: BRONCHIAL BIOPSIES;  Surgeon: Collene Gobble, MD;  Location: [facility];  Service: Pulmonary;;   BRONCHIAL BRUSHINGS  10/09/2021   Procedure: BRONCHIAL BRUSHINGS;  Surgeon: Collene Gobble, MD;  Location: [facility] ENDOSCOPY;  Service: Pulmonary;;   BRONCHIAL NEEDLE ASPIRATION BIOPSY  10/09/2021   Procedure: BRONCHIAL NEEDLE ASPIRATION BIOPSIES;  Surgeon: Collene Gobble, MD;  Location: [facility] ENDOSCOPY;  Service: Pulmonary;;   CARDIAC CATHETERIZATION  2007   left, RCA  100% occluded ruptured plaque with thrombus in the proximal segment   CYST EXCISION N/A 08/30/2021   Procedure: EXCISION OF POSTERIOR SCALP CYST;  Surgeon: Clovis Riley, MD;  Location: [facility];  Service: General;  Laterality: N/A;   CYSTOSCOPY N/A 07/17/2018   Procedure: Consuela Mimes;  Surgeon: Franchot Gallo, MD;  Location: [facility];  Service: Urology;  Laterality: N/A;  no seeds in bladder per Dr Diona Fanti   FIDUCIAL MARKER PLACEMENT  10/09/2021   Procedure: FIDUCIAL MARKER PLACEMENT;  Surgeon: Collene Gobble, MD;  [facility] END[facility];  Service: Pulmonary;;   HAND SURGERY Right    has metal plate in arm   PROSTATE BIOPSY     RADIOACTIVE SEED IMPLANT N/A 07/17/2018   Procedure: RADIOACTIVE SEED IMPLANT/BRACHYTHERAPY IMPLANT;  Surgeon: Franchot Gallo, MD;  Location: [facility];  Service: Urology;  Laterality: N/A;  77 seeds   SPACE OAR INSTILLATION N/A 07/17/2018   Procedure: SPACE OAR INSTILLATION;  Surgeon: Franchot Gallo, MD;  Location[facility];  Service: Urology;  Laterality: N/A;   VIDEO BRONCHOSCOPY WITH RADIAL ENDOBRONCHIAL ULTRASOUND  10/09/2021   Procedure: VIDEO BRONCHOSCOPY WITH RADIAL ENDOBRONCHIAL ULTRASOUND;  Surgeon: Collene Gobble, MD;  Location: [facility] ENDOSCOPY;  Service: Pulmonary;;   WRIST SURGERY  10/04/2012    SOCIAL HISTORY: Social  History   Socioeconomic History   Marital status: Married    Spouse name: Not on file   Number of children: Not on file   Years of education: Not on file   Highest education level: Not on file  Occupational History   Not on file  Tobacco Use   Smoking status: Every Day    Packs/day: 1.50    Years: 42.00    Total pack years: 63.00    Types: Cigarettes   Smokeless tobacco: Never  Vaping Use   Vaping Use: Never used  Substance and Sexual Activity   Alcohol use: No   Drug use: No   Sexual activity: Yes    Birth control/protection: None  Other Topics Concern    Not on file  Social History Narrative   10-04 19 Unable to ask abuse questions wife with him today.   Social Determinants of Health   Financial Resource Strain: Not on file  Food Insecurity: Not on file  Transportation Needs: Not on file  Physical Activity: Not on file  Stress: Not on file  Social Connections: Not on file  Intimate Partner Violence: Not on file    FAMILY HISTORY: Family History  Problem Relation Age of Onset   Breast cancer Mother    Prostate cancer Neg Hx    Kidney cancer Neg Hx    Cancer Neg Hx     ALLERGIES:  has No Known Allergies.  MEDICATIONS:  Current Outpatient Medications  Medication Sig Dispense Refill   ALPRAZolam (XANAX) 0.25 MG tablet Take 1 tablet (0.25 mg total) by mouth at bedtime. 15 tablet 0   atorvastatin (LIPITOR) 20 MG tablet Take 1 tablet (20 mg total) by mouth daily. 90 tablet 3   clopidogrel (PLAVIX) 75 MG tablet Take 1 tablet (75 mg total) by mouth daily. 90 tablet 3   cyclobenzaprine (FLEXERIL) 10 MG tablet Take 1 tablet by mouth 3 (three) times daily as needed.     ibuprofen (ADVIL) 800 MG tablet Take 800 mg by mouth every 8 (eight) hours as needed.     ketoconazole (NIZORAL) 2 % cream Apply 1 application. topically 2 (two) times daily as needed for irritation.     metoprolol tartrate (LOPRESSOR) 25 MG tablet Take 1 tablet (25 mg total) by mouth daily. 90 tablet 3   pantoprazole (PROTONIX) 40 MG tablet Take 1 tablet (40 mg total) by mouth daily. 60 tablet 1   prochlorperazine (COMPAZINE) 10 MG tablet Take 1 tablet (10 mg total) by mouth every 6 (six) hours as needed for nausea or vomiting. 30 tablet 1   sertraline (ZOLOFT) 100 MG tablet TAKE 1 TABLET BY MOUTH EVERY DAY 90 tablet 0   tamsulosin (FLOMAX) 0.4 MG CAPS capsule Take 1 capsule (0.4 mg total) by mouth daily. 30 capsule 3   traMADol (ULTRAM) 50 MG tablet Take 1 tablet (50 mg total) by mouth every 6 (six) hours as needed. 30 tablet 0   Vitamin D, Ergocalciferol, (DRISDOL)  1.25 MG (50000 UNIT) CAPS capsule TAKE 1 CAPSULE BY MOUTH ONE TIME PER WEEK 12 capsule 1   No current facility-administered medications for this visit.   Facility-Administered Medications Ordered in Other Visits  Medication Dose Route Frequency Provider Last Rate Last Admin   0.9 %  sodium chloride infusion   Intravenous Once Charlaine Dalton R, MD       cemiplimab-rwlc (LIBTAYO) 350 mg in sodium chloride 0.9 % 100 mL chemo infusion  350 mg Intravenous Once Cammie Sickle, MD          .  PHYSICAL EXAMINATION: ECOG PERFORMANCE STATUS: 1 - Symptomatic but completely ambulatory  Vitals:   12/19/22 0936  BP: (!) 142/74  Pulse: 69  Resp: 16  Temp: 98.4 F (36.9 C)  SpO2: 99%     Filed Weights   12/19/22 0936  Weight: 237 lb 8 oz (107.7 kg)      Physical Exam Vitals and nursing note reviewed.  HENT:     Head: Normocephalic and atraumatic.     Mouth/Throat:     Pharynx: Oropharynx is clear.  Eyes:     Extraocular Movements: Extraocular movements intact.     Pupils: Pupils are equal, round, and reactive to light.  Cardiovascular:     Rate and Rhythm: Normal rate and regular rhythm.  Pulmonary:     Comments: Decreased breath sounds bilaterally.  Abdominal:     Palpations: Abdomen is soft.  Musculoskeletal:        General: Normal range of motion.     Cervical back: Normal range of motion.  Skin:    General: Skin is warm.  Neurological:     General: No focal deficit present.     Mental Status: He is alert and oriented to person, place, and time.  Psychiatric:        Behavior: Behavior normal.        Judgment: Judgment normal.      LABORATORY DATA:  I have reviewed the data as listed Lab Results  Component Value Date   WBC 6.2 12/19/2022   HGB 15.3 12/19/2022   HCT 47.4 12/19/2022   MCV 91.0 12/19/2022   PLT 224 12/19/2022   Recent Labs    10/31/22 0912 11/21/22 0900 12/19/22 0903  NA 137 142 140  K 3.8 3.8 4.5  CL 104 104 103  CO2 24 26  27   GLUCOSE 115* 117* 111*  BUN 19 16 22   CREATININE 0.96 0.98 1.12  CALCIUM 8.8* 9.5 9.4  GFRNONAA >60 >60 >60  PROT 7.7 7.6 7.8  ALBUMIN 4.2 4.1 4.5  AST 21 22 18   ALT 21 17 18   ALKPHOS 70 72 75  BILITOT 0.5 0.5 0.4    RADIOGRAPHIC STUDIES: I have personally reviewed the radiological images as listed and agreed with the findings in the report. No results found.  ASSESSMENT & PLAN:   Primary adenocarcinoma of upper lobe of right lung ([place]) # Non-small cell lung cancer/stage IV-favor adeno carcinoma-metastases to right femur/liver; synchronous squamous cell carcinoma scalp  S/P- Patient currently on maintanence Libatayo.  CT CAP- DEC 28th, 2023- Continued decrease in size of the RIGHT upper lobe pulmonary Nodule.; known hepatic metastatic lesion continues to decrease in size as well now at 5 mm. No new or progressive findings.   # Proceed with Single agent Libtayo maintenance- Labs today reviewed;  acceptable for treatment today.  DEC 2023- TSH-WNL. Stable. CT scan- ordered today/in 3 weeks.    # BONE METASTASES:  Impending right hip fracture right hip pain-status post radiation [[place]; DEC 2022]- S/p  Re-RT on 10/03 x5 Fx. NOV 2023-status post intramedullary nail fixation due to an impending femur fracture secondary  DEC 2023-bone scan no evidence of any distant bony disease; difficult to assess right femur because of recent surgery. Recommend   Awaiting dental clearance.  Stable.   # Low vit D- [July 2023- vit D- 25]; on  ergocalciferol- Calcium 9.5 improved/stable. Pending- vit D-25-OH.   # Squamous cell carcinoma of the scalp-s/p excision; positive margins- on libtayo-no clinical evidence of progression.  Might  need to consider radiation-if any local progression noted/also based on course lung cancer.  stable    # GERD- on prilosec recommend PPI BID prior to meals- stable     # CT scan, JAN 2024- incidental-Caudate hypertrophy and fissural widening of hepatic fissures should be  correlated with any clinical or laboratory evidence of liver disease. Currently awaiting on GI referral evaluation.   # IV access: PIV  # DISPOSITION: #Libtayo;- treatment Today. # as per IS- follow up in 3 weeks- MD;labs- cbc/cmp; Libtayo; CT CAP- Dr.B   All questions were answered. The patient knows to call the clinic with any problems, questions or concerns.    Cammie Sickle, MD 12/19/2022 10:40 AM

## 2022-12-19 NOTE — Assessment & Plan Note (Signed)
#   Non-small cell lung cancer/stage IV-favor adeno carcinoma-metastases to right femur/liver; synchronous squamous cell carcinoma scalp  S/P- Patient currently on maintanence Libatayo.  CT CAP- DEC 28th, 2023- Continued decrease in size of the RIGHT upper lobe pulmonary Nodule.; known hepatic metastatic lesion continues to decrease in size as well now at 5 mm. No new or progressive findings.   # Proceed with Single agent Libtayo maintenance- Labs today reviewed;  acceptable for treatment today.  DEC 2023- TSH-WNL. Stable. CT scan- ordered today/in 3 weeks.    # BONE METASTASES:  Impending right hip fracture right hip pain-status post radiation [[place]; DEC 2022]- S/p  Re-RT on 10/03 x5 Fx. NOV 2023-status post intramedullary nail fixation due to an impending femur fracture secondary  DEC 2023-bone scan no evidence of any distant bony disease; difficult to assess right femur because of recent surgery. Recommend   Awaiting dental clearance.  Stable.   # Low vit D- [July 2023- vit D- 25]; on  ergocalciferol- Calcium 9.5 improved/stable. Pending- vit D-25-OH.   # Squamous cell carcinoma of the scalp-s/p excision; positive margins- on libtayo-no clinical evidence of progression.  Might need to consider radiation-if any local progression noted/also based on course lung cancer.  stable    # GERD- on prilosec recommend PPI BID prior to meals- stable     # CT scan, JAN 2024- incidental-Caudate hypertrophy and fissural widening of hepatic fissures should be correlated with any clinical or laboratory evidence of liver disease. Currently awaiting on GI referral evaluation.   # IV access: PIV  # DISPOSITION: #Libtayo;- treatment Today. # as per IS- follow up in 3 weeks- MD;labs- cbc/cmp; Libtayo; CT CAP- Dr.B

## 2022-12-20 ENCOUNTER — Encounter: Payer: BC Managed Care – PPO | Admitting: Physical Therapy

## 2022-12-25 ENCOUNTER — Encounter: Payer: BC Managed Care – PPO | Admitting: Physical Therapy

## 2022-12-27 ENCOUNTER — Encounter: Payer: BC Managed Care – PPO | Admitting: Physical Therapy

## 2023-01-02 ENCOUNTER — Encounter: Payer: Self-pay | Admitting: Internal Medicine

## 2023-01-04 ENCOUNTER — Ambulatory Visit
Admission: RE | Admit: 2023-01-04 | Discharge: 2023-01-04 | Disposition: A | Discharge status: Home / Self Care | Payer: BC Managed Care – PPO | Source: Ambulatory Visit | Attending: Internal Medicine | Admitting: Internal Medicine

## 2023-01-04 DIAGNOSIS — C3411 Malignant neoplasm of upper lobe, right bronchus or lung: Secondary | ICD-10-CM | POA: Insufficient documentation

## 2023-01-04 DIAGNOSIS — C787 Secondary malignant neoplasm of liver and intrahepatic bile duct: Secondary | ICD-10-CM | POA: Diagnosis not present

## 2023-01-04 DIAGNOSIS — N200 Calculus of kidney: Secondary | ICD-10-CM | POA: Diagnosis not present

## 2023-01-04 DIAGNOSIS — C3490 Malignant neoplasm of unspecified part of unspecified bronchus or lung: Secondary | ICD-10-CM | POA: Diagnosis not present

## 2023-01-04 DIAGNOSIS — J9809 Other diseases of bronchus, not elsewhere classified: Secondary | ICD-10-CM | POA: Diagnosis not present

## 2023-01-04 MED ORDER — IOHEXOL 300 MG/ML  SOLN
100.0000 mL | Freq: Once | INTRAMUSCULAR | Status: AC | PRN
  Administered 2023-01-04: 100 mL via INTRAVENOUS

## 2023-01-07 ENCOUNTER — Encounter: Payer: Self-pay | Admitting: Hospice and Palliative Medicine

## 2023-01-07 DIAGNOSIS — R932 Abnormal findings on diagnostic imaging of liver and biliary tract: Secondary | ICD-10-CM | POA: Insufficient documentation

## 2023-01-07 DIAGNOSIS — Z1159 Encounter for screening for other viral diseases: Secondary | ICD-10-CM | POA: Diagnosis not present

## 2023-01-09 ENCOUNTER — Inpatient Hospital Stay: Payer: BC Managed Care – PPO

## 2023-01-09 ENCOUNTER — Inpatient Hospital Stay (HOSPITAL_BASED_OUTPATIENT_CLINIC_OR_DEPARTMENT_OTHER): Payer: BC Managed Care – PPO | Admitting: Internal Medicine

## 2023-01-09 ENCOUNTER — Inpatient Hospital Stay: Payer: BC Managed Care – PPO | Attending: Internal Medicine

## 2023-01-09 VITALS — BP 130/85 | HR 65 | Temp 98.6°F | Resp 18 | Wt 241.3 lb

## 2023-01-09 DIAGNOSIS — K219 Gastro-esophageal reflux disease without esophagitis: Secondary | ICD-10-CM | POA: Insufficient documentation

## 2023-01-09 DIAGNOSIS — Z79899 Other long term (current) drug therapy: Secondary | ICD-10-CM | POA: Diagnosis not present

## 2023-01-09 DIAGNOSIS — C4442 Squamous cell carcinoma of skin of scalp and neck: Secondary | ICD-10-CM | POA: Insufficient documentation

## 2023-01-09 DIAGNOSIS — C3411 Malignant neoplasm of upper lobe, right bronchus or lung: Secondary | ICD-10-CM

## 2023-01-09 DIAGNOSIS — Z1159 Encounter for screening for other viral diseases: Secondary | ICD-10-CM

## 2023-01-09 DIAGNOSIS — F17210 Nicotine dependence, cigarettes, uncomplicated: Secondary | ICD-10-CM | POA: Insufficient documentation

## 2023-01-09 DIAGNOSIS — C7951 Secondary malignant neoplasm of bone: Secondary | ICD-10-CM | POA: Diagnosis not present

## 2023-01-09 DIAGNOSIS — Z8546 Personal history of malignant neoplasm of prostate: Secondary | ICD-10-CM | POA: Diagnosis not present

## 2023-01-09 DIAGNOSIS — C787 Secondary malignant neoplasm of liver and intrahepatic bile duct: Secondary | ICD-10-CM | POA: Diagnosis not present

## 2023-01-09 DIAGNOSIS — J449 Chronic obstructive pulmonary disease, unspecified: Secondary | ICD-10-CM | POA: Diagnosis not present

## 2023-01-09 DIAGNOSIS — Z5111 Encounter for antineoplastic chemotherapy: Secondary | ICD-10-CM | POA: Insufficient documentation

## 2023-01-09 LAB — IRON AND TIBC
Iron: 112 ug/dL (ref 45–182)
Saturation Ratios: 30 % (ref 17.9–39.5)
TIBC: 375 ug/dL (ref 250–450)
UIBC: 263 ug/dL

## 2023-01-09 LAB — CBC WITH DIFFERENTIAL/PLATELET
Abs Immature Granulocytes: 0.01 10*3/uL (ref 0.00–0.07)
Basophils Absolute: 0 10*3/uL (ref 0.0–0.1)
Basophils Relative: 1 %
Eosinophils Absolute: 0.1 10*3/uL (ref 0.0–0.5)
Eosinophils Relative: 2 %
HCT: 45.8 % (ref 39.0–52.0)
Hemoglobin: 15.4 g/dL (ref 13.0–17.0)
Immature Granulocytes: 0 %
Lymphocytes Relative: 14 %
Lymphs Abs: 0.9 10*3/uL (ref 0.7–4.0)
MCH: 29.7 pg (ref 26.0–34.0)
MCHC: 33.6 g/dL (ref 30.0–36.0)
MCV: 88.2 fL (ref 80.0–100.0)
Monocytes Absolute: 0.8 10*3/uL (ref 0.1–1.0)
Monocytes Relative: 12 %
Neutro Abs: 4.6 10*3/uL (ref 1.7–7.7)
Neutrophils Relative %: 71 %
Platelets: 231 10*3/uL (ref 150–400)
RBC: 5.19 MIL/uL (ref 4.22–5.81)
RDW: 14 % (ref 11.5–15.5)
WBC: 6.4 10*3/uL (ref 4.0–10.5)
nRBC: 0 % (ref 0.0–0.2)

## 2023-01-09 LAB — COMPREHENSIVE METABOLIC PANEL
ALT: 22 U/L (ref 0–44)
AST: 22 U/L (ref 15–41)
Albumin: 4.4 g/dL (ref 3.5–5.0)
Alkaline Phosphatase: 69 U/L (ref 38–126)
Anion gap: 8 (ref 5–15)
BUN: 19 mg/dL (ref 8–23)
CO2: 22 mmol/L (ref 22–32)
Calcium: 9.1 mg/dL (ref 8.9–10.3)
Chloride: 104 mmol/L (ref 98–111)
Creatinine, Ser: 0.92 mg/dL (ref 0.61–1.24)
GFR, Estimated: >60 mL/min (ref >60)
Glucose, Bld: 111 mg/dL — ABNORMAL HIGH (ref 70–99)
Potassium: 3.8 mmol/L (ref 3.5–5.1)
Sodium: 134 mmol/L — ABNORMAL LOW (ref 135–145)
Total Bilirubin: 0.6 mg/dL (ref 0.3–1.2)
Total Protein: 7.6 g/dL (ref 6.5–8.1)

## 2023-01-09 LAB — HEPATITIS B SURFACE ANTIGEN: Hepatitis B Surface Ag: NONREACTIVE

## 2023-01-09 LAB — PROTIME-INR
INR: 1 (ref 0.8–1.2)
Prothrombin Time: 12.6 seconds (ref 11.4–15.2)

## 2023-01-09 LAB — HEPATITIS B CORE ANTIBODY, IGM: Hep B C IgM: NONREACTIVE

## 2023-01-09 LAB — HEPATITIS A ANTIBODY, TOTAL: hep A Total Ab: NONREACTIVE

## 2023-01-09 LAB — HEPATITIS C ANTIBODY: HCV Ab: NONREACTIVE

## 2023-01-09 LAB — FERRITIN: Ferritin: 40 ng/mL (ref 24–336)

## 2023-01-09 MED ORDER — SODIUM CHLORIDE 0.9 % IV SOLN
350.0000 mg | Freq: Once | INTRAVENOUS | Status: AC
  Administered 2023-01-09: 350 mg via INTRAVENOUS
  Filled 2023-01-09: qty 7

## 2023-01-09 MED ORDER — SODIUM CHLORIDE 0.9 % IV SOLN
Freq: Once | INTRAVENOUS | Status: AC
  Filled 2023-01-09: qty 250

## 2023-01-09 NOTE — Assessment & Plan Note (Addendum)
#   Non-small cell lung cancer/stage IV-favor adeno carcinoma-metastases to right femur/liver; synchronous squamous cell carcinoma scalp  S/P- Patient currently on maintanence Libatayo.  CT CAP-March, 8th, 2024- CT CAP- Unchanged post treatment appearance of a nodule of the right pulmonary apex; Right lobe hepatic metastasis is almost completely resolved on today's examination, measuring no greater than 0.4 cm. No new lesions;  No evidence of lymphadenopathy or other metastatic disease in the chest, abdomen, or pelvis.  # Proceed with Single agent Libtayo maintenance- Labs today reviewed;  acceptable for treatment today.  DEC 2023- TSH-WNL. Stable.    # BONE METASTASES:  Impending right hip fracture right hip pain-status post radiation [[place]; DEC 2022]- S/p  Re-RT on 10/03 x5 Fx. NOV 2023-status post intramedullary nail fixation due to an impending femur fracture secondary  DEC 2023-bone scan no evidence of any distant bony disease; difficult to assess right femur because of recent surgery. Recommend   Awaiting dental clearance. Stable.   # Low vit D- [July 2023- vit D- 25]; on  ergocalciferol- Calcium 9.5 improved/stable. FEB vit D-25-OH- 41. Stable.    # Squamous cell carcinoma of the scalp-s/p excision; positive margins- on libtayo-no clinical evidence of progression.  Might need to consider radiation-if any local progression noted/also based on course lung cancer. Stable.   # COPD-recommend follow-up with pulmonary, [facility] [place].   # GERD- on prilosec continue PPI BID prior to meals- stable     # CT scan, JAN 2024- incidental-Caudate hypertrophy and fissural widening of hepatic fissures should be correlated with any clinical or laboratory evidence of liver disease. S/p  GI referral evaluation- awaiting labs.   #Incidental findings on Imaging  CT CAP- 2024: I reviewed/discussed/counseled the patient. Recommend quitting   # IV access: PIV  # DISPOSITION: #Libtayo;- treatment Today. # as per IS-  follow up in 3 weeks- MD;labs- cbc/cmp; Libtayo;TSH- Dr.B

## 2023-01-09 NOTE — Progress Notes (Signed)
[facility] CONSULT NOTE  Patient Care Team: Casilda Carls, MD as PCP - General (Internal Medicine) Lorretta Harp, MD as PCP - Cardiology (Cardiology) Curt Bears, MD as Consulting Physician (Oncology) Casilda Carls, MD as Referring Physician (Internal Medicine) Cammie Sickle, MD as Consulting Physician (Internal Medicine) Cammie Sickle, MD as Consulting Physician (Internal Medicine)  CHIEF COMPLAINTS/PURPOSE OF CONSULTATION: lung cancer  #  Oncology History Overview Note  DIAGNOSIS: 1) stage IV (T1c, N0, M1 C) non-small cell lung cancer favoring adenocarcinoma presented with right lung apical nodule in addition to metastatic disease in the right hepatic lobe and the proximal right femoral diaphysis diagnosed and December 2022. 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    QNS; Guardant 360 showed positive KRAS G12C mutation  FINAL MICROSCOPIC DIAGNOSIS:   A. LUNG, RUL, FINE NEEDLE ASPIRATION:  - Malignant cells consistent with non-small cell carcinoma, see comment   B. LUNG, RUL, BRUSHING:  - Malignant cells consistent with non-small cell carcinoma, see comment       COMMENT:   A and B.  Dr. Saralyn Pilar reviewed the case and concurs with the diagnosis.  Only rare malignant cells are present on the cellblock.  Immunohistochemical stains were attempted and show that the tumor cells  have patchy staining for TTF-1 whereas p63, p40 and CK5/6 are negative.  The findings are nondiagnostic but suggestive of a lung adenocarcinoma.  Dr. Lamonte Sakai was notified on 10/13/2021.   SEP-OCT 2022-  [Dermatology]-   Poorly differentiated squamous cell carcinoma; -  Carcinoma extends to the edges of the excision specimen   IMPRESSION: 1. The spiculated nodule at the right lung apex on recent neck CT is hypermetabolic and is concerning for primary bronchogenic  carcinoma. Tissue sampling recommended. 2. Hypermetabolic activity within the occipital scalp and small previously demonstrated right occipital lymph node compatible with known squamous cell carcinoma. Evaluation of the head and neck limited by motion artifact. 3. Hypermetabolic lesions inferiorly in the right hepatic lobe and in the proximal right femoral diaphysis suspicious for metastatic disease, primary uncertain in this patient with a history of prostate cancer. Correlate with PSA levels. Abdominal MRI without and with contrast may be helpful for further characterization of the liver lesion.  # s/p RT tto RUL [[place]]; right hip RT;   # 2007MI-CAD [s/p stent-Dr.Berry; EF 2021- 58%]     Primary adenocarcinoma of upper lobe of right lung ([facility])  10/18/2021 Initial Diagnosis   Primary adenocarcinoma of upper lobe of right lung ([place])   10/18/2021 Cancer Staging   Staging form: Lung, AJCC 8th Edition - Clinical: Stage IVB (cT1c, cN0, cM1c) - Signed by Curt Bears, MD on 10/18/2021   11/01/2021 - 05/30/2022 Chemotherapy   Patient is on Treatment Plan : LUNG NSCLC Pemetrexed + Carboplatin q21d x 4 Cycles     11/01/2021 -  Chemotherapy   Patient is on Treatment Plan : LUNG NSCLC Libtayo q  21d       HISTORY OF PRESENTING ILLNESS: Ambulating with a cane.  Accompanied by his wife.   Chris [name] 62 y.o.  male metastatic stage IV non-small cell lung cancer/favor adenocarcinoma-currently on maintained libatyo here for follow-up/review results of the CT scan.  S/p evaluation with GI-for possible underlying cirrhosis.  Doing well. Appetite is good. Breathing well. Chronic cough as pt still smokes.  Denies any worsening pain. Has chronic low back and right leg pain.   Review  of Systems  Constitutional:  Positive for malaise/fatigue. Negative for chills, diaphoresis, fever and weight loss.  HENT:  Positive for hearing loss. Negative for nosebleeds and sore throat.   Eyes:  Negative  for double vision.  Respiratory:  Negative for cough, hemoptysis, sputum production, shortness of breath and wheezing.   Cardiovascular:  Negative for chest pain, palpitations, orthopnea and leg swelling.  Gastrointestinal:  Negative for abdominal pain, blood in stool, diarrhea, heartburn, melena, nausea and vomiting.  Genitourinary:  Negative for dysuria, frequency and urgency.  Musculoskeletal:  Negative for back pain.  Skin: Negative.  Negative for itching and rash.  Neurological:  Negative for dizziness, tingling, focal weakness, weakness and headaches.  Endo/Heme/Allergies:  Does not bruise/bleed easily.  Psychiatric/Behavioral:  Negative for depression. The patient does not have insomnia.      MEDICAL HISTORY:  Past Medical History:  Diagnosis Date   Anxiety    associated with medical care,  worsened by difficulty hearing, does better with wife present   Complication of anesthesia    wife states very anxious may need pre sedation   Coronary artery disease    GERD (gastroesophageal reflux disease)    Hearing loss    mild per wife   Hearing loss    no hearing aids per wife   Hyperlipidemia    Hypertension    MI (myocardial infarction) ([place]) 04/17/2006   Acute inferolateral wall MI with cardiogenic shock and complete heart block   Primary adenocarcinoma of upper lobe of right lung ([facility])    Prostate cancer ([place])    Tobacco abuse    Wears glasses    Wears partial dentures    Upper    SURGICAL HISTORY: Past Surgical History:  Procedure Laterality Date   BRONCHIAL BIOPSY  10/09/2021   Procedure: BRONCHIAL BIOPSIES;  Surgeon: Collene Gobble, MD;  Location: [facility];  Service: Pulmonary;;   BRONCHIAL BRUSHINGS  10/09/2021   Procedure: BRONCHIAL BRUSHINGS;  Surgeon: Collene Gobble, MD;  Location[facility];  Service: Pulmonary;;   BRONCHIAL NEEDLE ASPIRATION BIOPSY  10/09/2021   Procedure: BRONCHIAL NEEDLE ASPIRATION BIOPSIES;  Surgeon: Collene Gobble, MD;   Location: [facility]OSCOPY;  Service: Pulmonary;;   CARDIAC CATHETERIZATION  2007   left, RCA 100% occluded ruptured plaque with thrombus in the proximal segment   CYST EXCISION N/A 08/30/2021   Procedure: EXCISION OF POSTERIOR SCALP CYST;  Surgeon: Clovis Riley, MD;  Location: [facility];  Service: General;  Laterality: N/A;   CYSTOSCOPY N/A 07/17/2018   Procedure: Consuela Mimes;  Surgeon: Franchot Gallo, MD;  Location[facility];  Service: Urology;  Laterality: N/A;  no seeds in bladder per Dr Diona Fanti   FIDUCIAL MARKER PLACEMENT  10/09/2021   Procedure: FIDUCIAL MARKER PLACEMENT;  Surgeon: Collene Gobble, MD;  Location: [facility] ENDOSCOPY;  Service: Pulmonary;;   HAND SURGERY Right    has metal plate in arm   PROSTATE BIOPSY     RADIOACTIVE SEED IMPLANT N/A 07/17/2018   Procedure: RADIOACTIVE SEED IMPLANT/BRACHYTHERAPY IMPLANT;  Surgeon: Franchot Gallo, MD;  Location[facility];  Service: Urology;  Laterality: N/A;  77 seeds   SPACE OAR INSTILLATION N/A 07/17/2018   Procedure: SPACE OAR INSTILLATION;  Surgeon: Franchot Gallo, MD;  Location[facility];  Service: Urology;  Laterality: N/A;   VIDEO BRONCHOSCOPY WITH RADIAL ENDOBRONCHIAL ULTRASOUND  10/09/2021   Procedure: VIDEO BRONCHOSCOPY WITH RADIAL ENDOBRONCHIAL ULTRASOUND;  Surgeon: Collene Gobble, MD;  Location: [facility];  Service:  Pulmonary;;   WRIST SURGERY  10/04/2012    SOCIAL HISTORY: Social History   Socioeconomic History   Marital status: Married    Spouse name: Not on file   Number of children: Not on file   Years of education: Not on file   Highest education level: Not on file  Occupational History   Not on file  Tobacco Use   Smoking status: Every Day    Packs/day: 1.50    Years: 42.00    Total pack years: 63.00    Types: Cigarettes   Smokeless tobacco: Never  Vaping Use   Vaping Use: Never used  Substance and Sexual Activity   Alcohol use: No   Drug  use: No   Sexual activity: Yes    Birth control/protection: None  Other Topics Concern   Not on file  Social History Narrative   10-04 19 Unable to ask abuse questions wife with him today.   Social Determinants of Health   Financial Resource Strain: Not on file  Food Insecurity: Not on file  Transportation Needs: Not on file  Physical Activity: Not on file  Stress: Not on file  Social Connections: Not on file  Intimate Partner Violence: Not on file    FAMILY HISTORY: Family History  Problem Relation Age of Onset   Breast cancer Mother    Prostate cancer Neg Hx    Kidney cancer Neg Hx    Cancer Neg Hx     ALLERGIES:  has No Known Allergies.  MEDICATIONS:  Current Outpatient Medications  Medication Sig Dispense Refill   ALPRAZolam (XANAX) 0.25 MG tablet Take 1 tablet (0.25 mg total) by mouth at bedtime. 15 tablet 0   atorvastatin (LIPITOR) 20 MG tablet Take 1 tablet (20 mg total) by mouth daily. 90 tablet 3   clopidogrel (PLAVIX) 75 MG tablet Take 1 tablet (75 mg total) by mouth daily. 90 tablet 3   cyclobenzaprine (FLEXERIL) 10 MG tablet Take 1 tablet by mouth 3 (three) times daily as needed.     ibuprofen (ADVIL) 800 MG tablet Take 800 mg by mouth every 8 (eight) hours as needed.     ketoconazole (NIZORAL) 2 % cream Apply 1 application. topically 2 (two) times daily as needed for irritation.     metoprolol tartrate (LOPRESSOR) 25 MG tablet Take 1 tablet (25 mg total) by mouth daily. 90 tablet 3   pantoprazole (PROTONIX) 40 MG tablet Take 1 tablet (40 mg total) by mouth daily. 60 tablet 1   sertraline (ZOLOFT) 100 MG tablet TAKE 1 TABLET BY MOUTH EVERY DAY 90 tablet 0   tamsulosin (FLOMAX) 0.4 MG CAPS capsule Take 1 capsule (0.4 mg total) by mouth daily. 30 capsule 3   Vitamin D, Ergocalciferol, (DRISDOL) 1.25 MG (50000 UNIT) CAPS capsule TAKE 1 CAPSULE BY MOUTH ONE TIME PER WEEK 12 capsule 1   prochlorperazine (COMPAZINE) 10 MG tablet Take 1 tablet (10 mg total) by mouth  every 6 (six) hours as needed for nausea or vomiting. (Patient not taking: Reported on 01/09/2023) 30 tablet 1   No current facility-administered medications for this visit.   Facility-Administered Medications Ordered in Other Visits  Medication Dose Route Frequency Provider Last Rate Last Admin   cemiplimab-rwlc (LIBTAYO) 350 mg in sodium chloride 0.9 % 100 mL chemo infusion  350 mg Intravenous Once Cammie Sickle, MD          .  PHYSICAL EXAMINATION: ECOG PERFORMANCE STATUS: 1 - Symptomatic but completely ambulatory  Vitals:  01/09/23 0949 01/09/23 1000  BP: (!) 153/93 130/85  Pulse: 65   Resp: 18   Temp: 98.6 F (37 C)   SpO2: 100%       Filed Weights   01/09/23 0949  Weight: 241 lb 4.8 oz (109.5 kg)       Physical Exam Vitals and nursing note reviewed.  HENT:     Head: Normocephalic and atraumatic.     Mouth/Throat:     Pharynx: Oropharynx is clear.  Eyes:     Extraocular Movements: Extraocular movements intact.     Pupils: Pupils are equal, round, and reactive to light.  Cardiovascular:     Rate and Rhythm: Normal rate and regular rhythm.  Pulmonary:     Comments: Decreased breath sounds bilaterally.  Abdominal:     Palpations: Abdomen is soft.  Musculoskeletal:        General: Normal range of motion.     Cervical back: Normal range of motion.  Skin:    General: Skin is warm.  Neurological:     General: No focal deficit present.     Mental Status: He is alert and oriented to person, place, and time.  Psychiatric:        Behavior: Behavior normal.        Judgment: Judgment normal.      LABORATORY DATA:  I have reviewed the data as listed Lab Results  Component Value Date   WBC 6.4 01/09/2023   HGB 15.4 01/09/2023   HCT 45.8 01/09/2023   MCV 88.2 01/09/2023   PLT 231 01/09/2023   Recent Labs    11/21/22 0900 12/19/22 0903 01/09/23 0900  NA 142 140 134*  K 3.8 4.5 3.8  CL 104 103 104  CO2 '26 27 22  '$ GLUCOSE 117* 111* 111*   BUN '16 22 19  '$ CREATININE 0.98 1.12 0.92  CALCIUM 9.5 9.4 9.1  GFRNONAA >60 >60 >60  PROT 7.6 7.8 7.6  ALBUMIN 4.1 4.5 4.4  AST '22 18 22  '$ ALT '17 18 22  '$ ALKPHOS 72 75 69  BILITOT 0.5 0.4 0.6    RADIOGRAPHIC STUDIES: I have personally reviewed the radiological images as listed and agreed with the findings in the report. CT CHEST ABDOMEN PELVIS W CONTRAST  Result Date: 01/05/2023 CLINICAL DATA:  Metastatic non-small cell lung cancer restaging * Tracking Code: BO * EXAM: CT CHEST, ABDOMEN, AND PELVIS WITH CONTRAST TECHNIQUE: Multidetector CT imaging of the chest, abdomen and pelvis was performed following the standard protocol during bolus administration of intravenous contrast. RADIATION DOSE REDUCTION: This exam was performed according to the departmental dose-optimization program which includes automated exposure control, adjustment of the mA and/or kV according to patient size and/or use of iterative reconstruction technique. CONTRAST:  184m OMNIPAQUE IOHEXOL 300 MG/ML  SOLN COMPARISON:  10/26/2022 FINDINGS: CT CHEST FINDINGS Cardiovascular: Aortic atherosclerosis. Normal heart size. Right coronary artery calcifications and or stents. No pericardial effusion. Mediastinum/Nodes: No enlarged mediastinal, hilar, or axillary lymph nodes. Thyroid gland, trachea, and esophagus demonstrate no significant findings. Lungs/Pleura: Unchanged post treatment appearance of a nodule of the right pulmonary apex measuring 1.4 x 0.8 cm (series 8, image 23). Adjacent fiducial. Diffuse bilateral bronchial wall thickening and background of fine centrilobular nodularity. No pleural effusion or pneumothorax. Musculoskeletal: No chest wall abnormality. No acute osseous findings. CT ABDOMEN PELVIS FINDINGS Hepatobiliary: Right lobe hepatic metastasis is almost completely resolved on today's examination, measuring no greater than 0.4 cm (series 2, image 67). No new lesions. No gallstones, gallbladder  wall thickening, or  biliary dilatation. Pancreas: Unremarkable. No pancreatic ductal dilatation or surrounding inflammatory changes. Spleen: Normal in size without significant abnormality. Adrenals/Urinary Tract: Unchanged benign bilateral adrenal adenomata, previously of fatty attenuation and not PET avid, no specific further follow-up or characterization is required. Nonobstructive calculi of the superior pole of the left kidney. No right-sided calculi, ureteral calculi, or hydronephrosis. Bladder is unremarkable. Stomach/Bowel: Stomach is within normal limits. Appendix appears normal. No evidence of bowel wall thickening, distention, or inflammatory changes. Sigmoid diverticula. Vascular/Lymphatic: Aortic atherosclerosis. No enlarged abdominal or pelvic lymph nodes. Reproductive: Prostate brachytherapy. Other: No abdominal wall hernia or abnormality. No ascites. Musculoskeletal: No acute osseous findings. Intramedullary nail fixation of the right hip. IMPRESSION: 1. Unchanged post treatment appearance of a nodule of the right pulmonary apex. 2. Right lobe hepatic metastasis is almost completely resolved on today's examination, measuring no greater than 0.4 cm. No new lesions. 3. No evidence of lymphadenopathy or other metastatic disease in the chest, abdomen, or pelvis. 4. Diffuse bilateral bronchial wall thickening and background of fine centrilobular nodularity, most commonly seen in smoking-related respiratory bronchiolitis. 5. Coronary artery disease. 6. Nonobstructive left nephrolithiasis. 7. Prostate brachytherapy. Aortic Atherosclerosis (ICD10-I70.0). Electronically Signed   By: Delanna Ahmadi M.D.   On: 01/05/2023 14:54    ASSESSMENT & PLAN:   Primary adenocarcinoma of upper lobe of right lung ([facility] [place]) # Non-small cell lung cancer/stage IV-favor adeno carcinoma-metastases to right femur/liver; synchronous squamous cell carcinoma scalp  S/P- Patient currently on maintanence Libatayo.  CT CAP-March, 8th, 2024- CT CAP-  Unchanged post treatment appearance of a nodule of the right pulmonary apex; Right lobe hepatic metastasis is almost completely resolved on today's examination, measuring no greater than 0.4 cm. No new lesions;  No evidence of lymphadenopathy or other metastatic disease in the chest, abdomen, or pelvis.  # Proceed with Single agent Libtayo maintenance- Labs today reviewed;  acceptable for treatment today.  DEC 2023- TSH-WNL. Stable.    # BONE METASTASES:  Impending right hip fracture right hip pain-status post radiation [[place]; DEC 2022]- S/p  Re-RT on 10/03 x5 Fx. NOV 2023-status post intramedullary nail fixation due to an impending femur fracture secondary  DEC 2023-bone scan no evidence of any distant bony disease; difficult to assess right femur because of recent surgery. Recommend   Awaiting dental clearance. Stable.   # Low vit D- [July 2023- vit D- 25]; on  ergocalciferol- Calcium 9.5 improved/stable. FEB vit D-25-OH- 41. Stable.    # Squamous cell carcinoma of the scalp-s/p excision; positive margins- on libtayo-no clinical evidence of progression.  Might need to consider radiation-if any local progression noted/also based on course lung cancer. Stable.   # COPD-recommend follow-up with pulmonary, [facility] [place].   # GERD- on prilosec continue PPI BID prior to meals- stable     # CT scan, JAN 2024- incidental-Caudate hypertrophy and fissural widening of hepatic fissures should be correlated with any clinical or laboratory evidence of liver disease. S/p  GI referral evaluation- awaiting labs.   #Incidental findings on Imaging  CT CAP- 2024: I reviewed/discussed/counseled the patient. Recommend quitting   # IV access: PIV  # DISPOSITION: #Libtayo;- treatment Today. # as per IS- follow up in 3 weeks- MD;labs- cbc/cmp; Libtayo;TSH- Dr.B   All questions were answered. The patient knows to call the clinic with any problems, questions or concerns.    Cammie Sickle, MD 01/09/2023 10:45  AM

## 2023-01-09 NOTE — Patient Instructions (Signed)
Chris [name]  Discharge Instructions: Thank you for choosing [facility] to provide your oncology and hematology care.  If you have a lab appointment with the [facility], please go directly to the [facility] and check in at the registration area.  Wear comfortable clothing and clothing appropriate for easy access to any Portacath or PICC line.   We strive to give you quality time with your provider. You may need to reschedule your appointment if you arrive late (15 or more minutes).  Arriving late affects you and other patients whose appointments are after yours.  Also, if you miss three or more appointments without notifying the office, you may be dismissed from the clinic at the provider's discretion.      For prescription refill requests, have your pharmacy contact our office and allow 72 hours for refills to be completed.    Today you received the following chemotherapy and/or immunotherapy agents LIBTAYO      To help prevent nausea and vomiting after your treatment, we encourage you to take your nausea medication as directed.  BELOW ARE SYMPTOMS THAT SHOULD BE REPORTED IMMEDIATELY: *FEVER GREATER THAN 100.4 F (38 C) OR HIGHER *CHILLS OR SWEATING *NAUSEA AND VOMITING THAT IS NOT CONTROLLED WITH YOUR NAUSEA MEDICATION *UNUSUAL SHORTNESS OF BREATH *UNUSUAL BRUISING OR BLEEDING *URINARY PROBLEMS (pain or burning when urinating, or frequent urination) *BOWEL PROBLEMS (unusual diarrhea, constipation, pain near the anus) TENDERNESS IN MOUTH AND THROAT WITH OR WITHOUT PRESENCE OF ULCERS (sore throat, sores in mouth, or a toothache) UNUSUAL RASH, SWELLING OR PAIN  UNUSUAL VAGINAL DISCHARGE OR ITCHING   Items with * indicate a potential emergency and should be followed up as soon as possible or go to the Emergency Department if any problems should occur.  Please show the CHEMOTHERAPY ALERT CARD or IMMUNOTHERAPY ALERT CARD at check-in to  the Emergency Department and triage nurse.  Should you have questions after your visit or need to cancel or reschedule your appointment, please contact [facility]  [phone] and follow the prompts.  Office hours are 8:00 a.m. to 4:30 p.m. Monday - Friday. Please note that voicemails left after 4:00 p.m. may not be returned until the following business day.  We are closed weekends and major holidays. You have access to a nurse at all times for urgent questions. Please call the main number to the clinic [phone] and follow the prompts.  For any non-urgent questions, you may also contact your provider using MyChart. We now offer e-Visits for anyone 27 and older to request care online for non-urgent symptoms. For details visit [email or web address].   Also download the MyChart app! Go to the app store, search "MyChart", open the app, select [facility], and log in with your MyChart username and password.  Cemiplimab Injection What is this medication? CEMIPLIMAB (se MIP li mab) treats skin cancer and lung cancer. It works by helping your immune system slow or stop the spread of cancer cells. It is a monoclonal antibody. This medicine may be used for other purposes; ask your health care provider or pharmacist if you have questions. COMMON BRAND NAME(S): LIBTAYO What should I tell my care team before I take this medication? They need to know if you have any of these conditions: Allogeneic stem cell transplant (uses someone else's stem cells) Autoimmune diseases, such as Crohn's disease, ulcerative colitis, or lupus Diabetes Nervous system problems, such as myasthenia gravis or Guillain-Barre syndrome Organ  transplant Recent or ongoing radiation Thyroid disease An unusual or allergic reaction to cemiplimab, other medications, foods, dyes, or preservatives Pregnant or trying to get pregnant Breast-feeding How should I use this medication? This medication  is infused into a vein. It is given by your care team in a hospital or clinic setting. A special MedGuide will be given to you before each treatment. Be sure to read this information carefully each time. Talk to your care team about the use of this medication in children. Special care may be needed. Overdosage: If you think you have taken too much of this medicine contact a poison control center or emergency room at once. NOTE: This medicine is only for you. Do not share this medicine with others. What if I miss a dose? Keep appointments for follow-up doses. It is important not to miss your dose. Call your care team if you are unable to keep an appointment. What may interact with this medication? Interactions have not been studied. This list may not describe all possible interactions. Give your health care provider a list of all the medicines, herbs, non-prescription drugs, or dietary supplements you use. Also tell them if you smoke, drink alcohol, or use illegal drugs. Some items may interact with your medicine. What should I watch for while using this medication? This medication may make you feel generally unwell. This is not uncommon as chemotherapy can affect healthy cells as well as cancer cells. Report any side effects. Continue your course of treatment even though you feel ill until your care team tells you to stop. You may need blood work done while you are taking this medication. This medication may cause serious skin reactions. They can happen in the weeks to months after starting the medication. Contact your care team right away if you notice fevers or flu-like symptoms with a rash. The rash may be red or purple and then turn into blisters or peeling of the skin. You may also notice a red rash with swelling of the face, lips, or lymph nodes in your neck or under your arms. Tell your care team right away if you have any changes in your vision. This medication may increase blood sugar. The  risk may be higher in patients who already have diabetes. Ask your care team what you can do to lower your risk of diabetes while taking this medication. Talk to your care team if you wish to become pregnant or think you might be pregnant. This medication can cause serious birth defects if taken during pregnancy. A negative pregnancy test is required before starting this medication. A reliable form of contraception is recommended while taking this medication and for at least 4 months after stopping it. Do not breast-feed while taking this medication and for at least 4 months after stopping it. What side effects may I notice from receiving this medication? Side effects that you should report to your care team as soon as possible: Allergic reactions--skin rash, itching, hives, swelling of the face, lips, tongue, or throat Dry cough, shortness of breath or trouble breathing Eye pain, redness, irritation, or discharge with blurry or decreased vision Heart muscle inflammation--unusual weakness or fatigue, shortness of breath, chest pain, fast or irregular heartbeat, dizziness, swelling of the ankles, feet, or hands Hormone gland problems--headache, sensitivity to light, unusual weakness or fatigue, dizziness, fast or irregular heartbeat, increased sensitivity to cold or heat, excessive sweating, constipation, hair loss, increased thirst or amount of urine, tremors or shaking, irritability Infusion reactions--chest pain, shortness  of breath or trouble breathing, feeling faint or lightheaded Kidney injury (glomerulonephritis)--decrease in the amount of urine, red or dark brown urine, foamy or bubbly urine, swelling of the ankles, hands, or feet Liver injury--right upper belly pain, loss of appetite, nausea, light-colored stool, dark yellow or brown urine, yellowing skin or eyes, unusual weakness or fatigue Pain, tingling, or numbness in the hands or feet, muscle weakness, change in vision, confusion or trouble  speaking, loss of balance or coordination, trouble walking, seizures Rash, fever, and swollen lymph nodes Redness, blistering, peeling, or loosening of the skin, including inside the mouth Sudden or severe stomach pain, bloody diarrhea, fever, nausea, vomiting Side effects that usually do not require medical attention (report these to your care team if they continue or are bothersome): Bone, joint, or muscle pain Diarrhea Fatigue Loss of appetite Nausea Skin rash This list may not describe all possible side effects. Call your doctor for medical advice about side effects. You may report side effects to FDA at 1-800-FDA-1088. Where should I keep my medication? This medication is given in a hospital or clinic and will not be stored at home. NOTE: This sheet is a summary. It may not cover all possible information. If you have questions about this medicine, talk to your doctor, pharmacist, or health care provider.  2023 Elsevier/Gold Standard (2021-09-15 00:00:00)

## 2023-01-09 NOTE — Progress Notes (Signed)
Concern regarding CT results of lung wall thickening; wonders if he needs antibiotics for the congestion he is having.

## 2023-01-10 LAB — MISC LABCORP TEST (SEND OUT): Labcorp test code: 144473

## 2023-01-28 ENCOUNTER — Other Ambulatory Visit: Payer: Self-pay | Admitting: Internal Medicine

## 2023-01-28 ENCOUNTER — Encounter: Payer: Self-pay | Admitting: Internal Medicine

## 2023-01-30 ENCOUNTER — Inpatient Hospital Stay: Payer: BC Managed Care – PPO

## 2023-01-30 ENCOUNTER — Inpatient Hospital Stay: Payer: BC Managed Care – PPO | Admitting: Internal Medicine

## 2023-02-05 ENCOUNTER — Telehealth: Payer: Self-pay | Admitting: *Deleted

## 2023-02-05 DIAGNOSIS — Z483 Aftercare following surgery for neoplasm: Secondary | ICD-10-CM | POA: Diagnosis not present

## 2023-02-05 DIAGNOSIS — S7224XD Nondisplaced subtrochanteric fracture of right femur, subsequent encounter for closed fracture with routine healing: Secondary | ICD-10-CM | POA: Diagnosis not present

## 2023-02-05 DIAGNOSIS — C61 Malignant neoplasm of prostate: Secondary | ICD-10-CM | POA: Diagnosis not present

## 2023-02-05 DIAGNOSIS — C7951 Secondary malignant neoplasm of bone: Secondary | ICD-10-CM | POA: Diagnosis not present

## 2023-02-05 NOTE — Telephone Encounter (Signed)
[name] called wanting Dr B to know that Brett Canales is at [facility] to see Dr Katherine Roan because he is having problems with his femur and cannot walk well He is having Xrays and seeing doctor and is unsure at this time if he will be able to make his immunotherapy appointment tomorrow as he has not yet seen the doctor. Sh eis asking if Dr B thinks he should wait another week for his treatment or not. She would like a return call or my chart message from Dr B today if possible, buit she plans to call us tomorrow morning to let us know what has been said

## 2023-02-06 ENCOUNTER — Telehealth: Payer: Self-pay | Admitting: Internal Medicine

## 2023-02-06 ENCOUNTER — Inpatient Hospital Stay: Payer: BC Managed Care – PPO

## 2023-02-06 ENCOUNTER — Encounter: Payer: Self-pay | Admitting: Internal Medicine

## 2023-02-06 ENCOUNTER — Inpatient Hospital Stay: Payer: BC Managed Care – PPO | Admitting: Internal Medicine

## 2023-02-06 DIAGNOSIS — C3411 Malignant neoplasm of upper lobe, right bronchus or lung: Secondary | ICD-10-CM

## 2023-02-06 NOTE — Progress Notes (Deleted)
[facility] CONSULT NOTE  Patient Care Team: Sherrie MustacheJadali, Fayegh, MD as PCP - General (Internal Medicine) Runell GessBerry, Jonathan J, MD as PCP - Cardiology (Cardiology) Si GaulMohamed, Mohamed, MD as Consulting Physician (Oncology) Sherrie MustacheJadali, Fayegh, MD as Referring Physician (Internal Medicine) Earna CoderBrahmanday, Miyana Mordecai R, MD as Consulting Physician (Internal Medicine) Earna CoderBrahmanday, Wilmoth Rasnic R, MD as Consulting Physician (Internal Medicine)  CHIEF COMPLAINTS/PURPOSE OF CONSULTATION: lung cancer  #  Oncology History Overview Note  DIAGNOSIS: 1) stage IV (T1c, N0, M1 C) non-small cell lung cancer favoring adenocarcinoma presented with right lung apical nodule in addition to metastatic disease in the right hepatic lobe and the proximal right femoral diaphysis diagnosed and December 2022. 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    QNS; Guardant 360 showed positive KRAS G12C mutation  FINAL MICROSCOPIC DIAGNOSIS:   A. LUNG, RUL, FINE NEEDLE ASPIRATION:  - Malignant cells consistent with non-small cell carcinoma, see comment   B. LUNG, RUL, BRUSHING:  - Malignant cells consistent with non-small cell carcinoma, see comment       COMMENT:   A and B.  Dr. Luisa HartPatrick reviewed the case and concurs with the diagnosis.  Only rare malignant cells are present on the cellblock.  Immunohistochemical stains were attempted and show that the tumor cells  have patchy staining for TTF-1 whereas p63, p40 and CK5/6 are negative.  The findings are nondiagnostic but suggestive of a lung adenocarcinoma.  Dr. Delton CoombesByrum was notified on 10/13/2021.   SEP-OCT 2022-  [Dermatology]-   Poorly differentiated squamous cell carcinoma; -  Carcinoma extends to the edges of the excision specimen   IMPRESSION: 1. The spiculated nodule at the right lung apex on recent neck CT is hypermetabolic and is concerning for primary bronchogenic  carcinoma. Tissue sampling recommended. 2. Hypermetabolic activity within the occipital scalp and small previously demonstrated right occipital lymph node compatible with known squamous cell carcinoma. Evaluation of the head and neck limited by motion artifact. 3. Hypermetabolic lesions inferiorly in the right hepatic lobe and in the proximal right femoral diaphysis suspicious for metastatic disease, primary uncertain in this patient with a history of prostate cancer. Correlate with PSA levels. Abdominal MRI without and with contrast may be helpful for further characterization of the liver lesion.  # s/p RT tto RUL [[place]]; right hip RT;   # 2007MI-CAD [s/p stent-Dr.Berry; EF 2021- 58%]     Primary adenocarcinoma of upper lobe of right lung  10/18/2021 Initial Diagnosis   Primary adenocarcinoma of upper lobe of right lung (HCC)   10/18/2021 Cancer Staging   Staging form: Lung, AJCC 8th Edition - Clinical: Stage IVB (cT1c, cN0, cM1c) - Signed by Si GaulMohamed, Mohamed, MD on 10/18/2021   11/01/2021 - 05/30/2022 Chemotherapy   Patient is on Treatment Plan : LUNG NSCLC Pemetrexed + Carboplatin q21d x 4 Cycles     11/01/2021 -  Chemotherapy   Patient is on Treatment Plan : LUNG NSCLC Libtayo q  21d       HISTORY OF PRESENTING ILLNESS: Ambulating with a cane.  Accompanied by his wife.   Chris [name] 62 y.o.  male metastatic stage IV non-small cell lung cancer/favor adenocarcinoma-currently on maintained libatyo here for follow-up/review results of the CT scan.  S/p evaluation with GI-for possible underlying cirrhosis.  Doing well. Appetite is good. Breathing well. Chronic cough as pt still smokes.  Denies any worsening pain. Has chronic low back and right leg pain.   Review of  Systems  Constitutional:  Positive for malaise/fatigue. Negative for chills, diaphoresis, fever and weight loss.  HENT:  Positive for hearing loss. Negative for nosebleeds and sore throat.   Eyes:  Negative for  double vision.  Respiratory:  Negative for cough, hemoptysis, sputum production, shortness of breath and wheezing.   Cardiovascular:  Negative for chest pain, palpitations, orthopnea and leg swelling.  Gastrointestinal:  Negative for abdominal pain, blood in stool, diarrhea, heartburn, melena, nausea and vomiting.  Genitourinary:  Negative for dysuria, frequency and urgency.  Musculoskeletal:  Negative for back pain.  Skin: Negative.  Negative for itching and rash.  Neurological:  Negative for dizziness, tingling, focal weakness, weakness and headaches.  Endo/Heme/Allergies:  Does not bruise/bleed easily.  Psychiatric/Behavioral:  Negative for depression. The patient does not have insomnia.      MEDICAL HISTORY:  Past Medical History:  Diagnosis Date  . Anxiety    associated with medical care,  worsened by difficulty hearing, does better with wife present  . Complication of anesthesia    wife states very anxious may need pre sedation  . Coronary artery disease   . GERD (gastroesophageal reflux disease)   . Hearing loss    mild per wife  . Hearing loss    no hearing aids per wife  . Hyperlipidemia   . Hypertension   . MI (myocardial infarction) (HCC) 04/17/2006   Acute inferolateral wall MI with cardiogenic shock and complete heart block  . Primary adenocarcinoma of upper lobe of right lung (HCC)   . Prostate cancer (HCC)   . Tobacco abuse   . Wears glasses   . Wears partial dentures    Upper    SURGICAL HISTORY: Past Surgical History:  Procedure Laterality Date  . BRONCHIAL BIOPSY  10/09/2021   Procedure: BRONCHIAL BIOPSIES;  Surgeon: Leslye Peer, MD;  Location: [facility] ENDOSCOPY;  Service: Pulmonary;;  . BRONCHIAL BRUSHINGS  10/09/2021   Procedure: BRONCHIAL BRUSHINGS;  Surgeon: Leslye Peer, MD;  Location: [facility] ENDOSCOPY;  Service: Pulmonary;;  . BRONCHIAL NEEDLE ASPIRATION BIOPSY  10/09/2021   Procedure: BRONCHIAL NEEDLE ASPIRATION BIOPSIES;  Surgeon: Leslye Peer,  MD;  Location: [facility] ENDOSCOPY;  Service: Pulmonary;;  . CARDIAC CATHETERIZATION  2007   left, RCA 100% occluded ruptured plaque with thrombus in the proximal segment  . CYST EXCISION N/A 08/30/2021   Procedure: EXCISION OF POSTERIOR SCALP CYST;  Surgeon: Berna Bue, MD;  Location: [facility] ORS;  Service: General;  Laterality: N/A;  . CYSTOSCOPY N/A 07/17/2018   Procedure: Bluford Kaufmann;  Surgeon: Marcine Matar, MD;  Location[facility];  Service: Urology;  Laterality: N/A;  no seeds in bladder per Dr Retta Diones  . FIDUCIAL MARKER PLACEMENT  10/09/2021   Procedure: FIDUCIAL MARKER PLACEMENT;  Surgeon: Leslye Peer, MD;  Location: [facility] ENDOSCOPY;  Service: Pulmonary;;  . HAND SURGERY Right    has metal plate in arm  . PROSTATE BIOPSY    . RADIOACTIVE SEED IMPLANT N/A 07/17/2018   Procedure: RADIOACTIVE SEED IMPLANT/BRACHYTHERAPY IMPLANT;  Surgeon: Marcine Matar, MD;  Location: [facility];  Service: Urology;  Laterality: N/A;  77 seeds  . SPACE OAR INSTILLATION N/A 07/17/2018   Procedure: SPACE OAR INSTILLATION;  Surgeon: Marcine Matar, MD;  Location[facility];  Service: Urology;  Laterality: N/A;  . VIDEO BRONCHOSCOPY WITH RADIAL ENDOBRONCHIAL ULTRASOUND  10/09/2021   Procedure: VIDEO BRONCHOSCOPY WITH RADIAL ENDOBRONCHIAL ULTRASOUND;  Surgeon: Leslye Peer, MD;  Location: [facility] ENDOSCOPY;  Service: Pulmonary;;  .  WRIST SURGERY  10/04/2012    SOCIAL HISTORY: Social History   Socioeconomic History  . Marital status: Married    Spouse name: Not on file  . Number of children: Not on file  . Years of education: Not on file  . Highest education level: Not on file  Occupational History  . Not on file  Tobacco Use  . Smoking status: Every Day    Packs/day: 1.50    Years: 42.00    Additional pack years: 0.00    Total pack years: 63.00    Types: Cigarettes  . Smokeless tobacco: Never  Vaping Use  . Vaping Use: Never used   Substance and Sexual Activity  . Alcohol use: No  . Drug use: No  . Sexual activity: Yes    Birth control/protection: None  Other Topics Concern  . Not on file  Social History Narrative   10-04 19 Unable to ask abuse questions wife with him today.   Social Determinants of Health   Financial Resource Strain: Not on file  Food Insecurity: Not on file  Transportation Needs: Not on file  Physical Activity: Not on file  Stress: Not on file  Social Connections: Not on file  Intimate Partner Violence: Not on file    FAMILY HISTORY: Family History  Problem Relation Age of Onset  . Breast cancer Mother   . Prostate cancer Neg Hx   . Kidney cancer Neg Hx   . Cancer Neg Hx     ALLERGIES:  has No Known Allergies.  MEDICATIONS:  Current Outpatient Medications  Medication Sig Dispense Refill  . ALPRAZolam (XANAX) 0.25 MG tablet Take 1 tablet (0.25 mg total) by mouth at bedtime. 15 tablet 0  . atorvastatin (LIPITOR) 20 MG tablet Take 1 tablet (20 mg total) by mouth daily. 90 tablet 3  . clopidogrel (PLAVIX) 75 MG tablet Take 1 tablet (75 mg total) by mouth daily. 90 tablet 3  . cyclobenzaprine (FLEXERIL) 10 MG tablet Take 1 tablet by mouth 3 (three) times daily as needed.    Marland Kitchen ibuprofen (ADVIL) 800 MG tablet Take 800 mg by mouth every 8 (eight) hours as needed.    Marland Kitchen ketoconazole (NIZORAL) 2 % cream Apply 1 application. topically 2 (two) times daily as needed for irritation.    . metoprolol tartrate (LOPRESSOR) 25 MG tablet Take 1 tablet (25 mg total) by mouth daily. 90 tablet 3  . pantoprazole (PROTONIX) 40 MG tablet Take 1 tablet (40 mg total) by mouth daily. 60 tablet 1  . prochlorperazine (COMPAZINE) 10 MG tablet Take 1 tablet (10 mg total) by mouth every 6 (six) hours as needed for nausea or vomiting. (Patient not taking: Reported on 01/09/2023) 30 tablet 1  . sertraline (ZOLOFT) 100 MG tablet TAKE 1 TABLET BY MOUTH EVERY DAY 90 tablet 0  . tamsulosin (FLOMAX) 0.4 MG CAPS capsule  Take 1 capsule (0.4 mg total) by mouth daily. 30 capsule 3  . Vitamin D, Ergocalciferol, (DRISDOL) 1.25 MG (50000 UNIT) CAPS capsule TAKE 1 CAPSULE BY MOUTH ONE TIME PER WEEK 12 capsule 1   No current facility-administered medications for this visit.      Marland Kitchen  PHYSICAL EXAMINATION: ECOG PERFORMANCE STATUS: 1 - Symptomatic but completely ambulatory  There were no vitals filed for this visit.     There were no vitals filed for this visit.      Physical Exam Vitals and nursing note reviewed.  HENT:     Head: Normocephalic and atraumatic.  Mouth/Throat:     Pharynx: Oropharynx is clear.  Eyes:     Extraocular Movements: Extraocular movements intact.     Pupils: Pupils are equal, round, and reactive to light.  Cardiovascular:     Rate and Rhythm: Normal rate and regular rhythm.  Pulmonary:     Comments: Decreased breath sounds bilaterally.  Abdominal:     Palpations: Abdomen is soft.  Musculoskeletal:        General: Normal range of motion.     Cervical back: Normal range of motion.  Skin:    General: Skin is warm.  Neurological:     General: No focal deficit present.     Mental Status: He is alert and oriented to person, place, and time.  Psychiatric:        Behavior: Behavior normal.        Judgment: Judgment normal.     LABORATORY DATA:  I have reviewed the data as listed Lab Results  Component Value Date   WBC 6.4 01/09/2023   HGB 15.4 01/09/2023   HCT 45.8 01/09/2023   MCV 88.2 01/09/2023   PLT 231 01/09/2023   Recent Labs    11/21/22 0900 12/19/22 0903 01/09/23 0900  NA 142 140 134*  K 3.8 4.5 3.8  CL 104 103 104  CO2 26 27 22   GLUCOSE 117* 111* 111*  BUN 16 22 19   CREATININE 0.98 1.12 0.92  CALCIUM 9.5 9.4 9.1  GFRNONAA >60 >60 >60  PROT 7.6 7.8 7.6  ALBUMIN 4.1 4.5 4.4  AST 22 18 22   ALT 17 18 22   ALKPHOS 72 75 69  BILITOT 0.5 0.4 0.6     RADIOGRAPHIC STUDIES: I have personally reviewed the radiological images as listed and  agreed with the findings in the report. No results found.  ASSESSMENT & PLAN:   Primary adenocarcinoma of upper lobe of right lung (HCC) # Non-small cell lung cancer/stage IV-favor adeno carcinoma-metastases to right femur/liver; synchronous squamous cell carcinoma scalp  S/P- Patient currently on maintanence Libatayo.  CT CAP-March, 8th, 2024- CT CAP- Unchanged post treatment appearance of a nodule of the right pulmonary apex; Right lobe hepatic metastasis is almost completely resolved on today's examination, measuring no greater than 0.4 cm. No new lesions;  No evidence of lymphadenopathy or other metastatic disease in the chest, abdomen, or pelvis.  # Proceed with Single agent Libtayo maintenance- Labs today reviewed;  acceptable for treatment today.  DEC 2023- TSH-WNL. Stable.    # BONE METASTASES:  Impending right hip fracture right hip pain-status post radiation [[place]; DEC 2022]- S/p  Re-RT on 10/03 x5 Fx. NOV 2023-status post intramedullary nail fixation due to an impending femur fracture secondary  DEC 2023-bone scan no evidence of any distant bony disease; difficult to assess right femur because of recent surgery. Recommend   Awaiting dental clearance. Stable.   # Low vit D- [July 2023- vit D- 25]; on  ergocalciferol- Calcium 9.5 improved/stable. FEB vit D-25-OH- 41. Stable.    # Squamous cell carcinoma of the scalp-s/p excision; positive margins- on libtayo-no clinical evidence of progression.  Might need to consider radiation-if any local progression noted/also based on course lung cancer. Stable.   # COPD-recommend follow-up with pulmonary, [facility] [place].   # GERD- on prilosec continue PPI BID prior to meals- stable     # CT scan, JAN 2024- incidental-Caudate hypertrophy and fissural widening of hepatic fissures should be correlated with any clinical or laboratory evidence of liver disease. S/p  GI referral evaluation- awaiting labs.   #Incidental findings on Imaging  CT CAP- 2024: I  reviewed/discussed/counseled the patient. Recommend quitting   # IV access: PIV  # DISPOSITION: #Libtayo;- treatment Today. # as per IS- follow up in 3 weeks- MD;labs- cbc/cmp; Libtayo;TSH- Dr.B   All questions were answered. The patient knows to call the clinic with any problems, questions or concerns.    Earna Coder, MD 02/06/2023 8:41 AM

## 2023-02-06 NOTE — Telephone Encounter (Signed)
pt wife called in to have appts r/s. She states that he is having some mobility issue from precedure at [facility] yesterday.

## 2023-02-06 NOTE — Assessment & Plan Note (Deleted)
#   Non-small cell lung cancer/stage IV-favor adeno carcinoma-metastases to right femur/liver; synchronous squamous cell carcinoma scalp  S/P- Patient currently on maintanence Libatayo.  CT CAP-March, 8th, 2024- CT CAP- Unchanged post treatment appearance of a nodule of the right pulmonary apex; Right lobe hepatic metastasis is almost completely resolved on today's examination, measuring no greater than 0.4 cm. No new lesions;  No evidence of lymphadenopathy or other metastatic disease in the chest, abdomen, or pelvis.  # Proceed with Single agent Libtayo maintenance- Labs today reviewed;  acceptable for treatment today.  DEC 2023- TSH-WNL. Stable.    # BONE METASTASES:  Impending right hip fracture right hip pain-status post radiation [[place]; DEC 2022]- S/p  Re-RT on 10/03 x5 Fx. NOV 2023-status post intramedullary nail fixation due to an impending femur fracture secondary  DEC 2023-bone scan no evidence of any distant bony disease; difficult to assess right femur because of recent surgery. Recommend   Awaiting dental clearance. Stable.   # Low vit D- [July 2023- vit D- 25]; on  ergocalciferol- Calcium 9.5 improved/stable. FEB vit D-25-OH- 41. Stable.    # Squamous cell carcinoma of the scalp-s/p excision; positive margins- on libtayo-no clinical evidence of progression.  Might need to consider radiation-if any local progression noted/also based on course lung cancer. Stable.   # COPD-recommend follow-up with pulmonary, [facility] [place].   # GERD- on prilosec continue PPI BID prior to meals- stable     # CT scan, JAN 2024- incidental-Caudate hypertrophy and fissural widening of hepatic fissures should be correlated with any clinical or laboratory evidence of liver disease. S/p  GI referral evaluation- awaiting labs.   #Incidental findings on Imaging  CT CAP- 2024: I reviewed/discussed/counseled the patient. Recommend quitting   # IV access: PIV  # DISPOSITION: #Libtayo;- treatment Today. # as per IS-  follow up in 3 weeks- MD;labs- cbc/cmp; Libtayo;TSH- Dr.B  

## 2023-02-06 NOTE — Telephone Encounter (Signed)
Wife called reporting that patient will not be able to make his appointment today because he is unable to ambulate due to his hip. He is in work up for it at [facility] an Ellison Carwin is hoping that Dr B will call her today to discuss

## 2023-02-06 NOTE — Telephone Encounter (Signed)
Correction: pt wife called in to have appts r/s. She states that he is having some mobility issue from precedure at [facility].

## 2023-02-07 ENCOUNTER — Other Ambulatory Visit: Payer: Self-pay | Admitting: Internal Medicine

## 2023-02-12 ENCOUNTER — Encounter: Payer: Self-pay | Admitting: Hospice and Palliative Medicine

## 2023-02-14 ENCOUNTER — Inpatient Hospital Stay: Payer: BC Managed Care – PPO

## 2023-02-14 ENCOUNTER — Inpatient Hospital Stay (HOSPITAL_BASED_OUTPATIENT_CLINIC_OR_DEPARTMENT_OTHER): Payer: BC Managed Care – PPO | Admitting: Internal Medicine

## 2023-02-14 ENCOUNTER — Inpatient Hospital Stay: Payer: BC Managed Care – PPO | Attending: Internal Medicine

## 2023-02-14 ENCOUNTER — Encounter: Payer: Self-pay | Admitting: Internal Medicine

## 2023-02-14 VITALS — BP 130/84 | HR 70 | Temp 97.7°F | Resp 18 | Ht 71.0 in | Wt 243.0 lb

## 2023-02-14 DIAGNOSIS — C4442 Squamous cell carcinoma of skin of scalp and neck: Secondary | ICD-10-CM | POA: Insufficient documentation

## 2023-02-14 DIAGNOSIS — C3411 Malignant neoplasm of upper lobe, right bronchus or lung: Secondary | ICD-10-CM

## 2023-02-14 DIAGNOSIS — Z8546 Personal history of malignant neoplasm of prostate: Secondary | ICD-10-CM | POA: Diagnosis not present

## 2023-02-14 DIAGNOSIS — Z79899 Other long term (current) drug therapy: Secondary | ICD-10-CM | POA: Diagnosis not present

## 2023-02-14 DIAGNOSIS — Z5111 Encounter for antineoplastic chemotherapy: Secondary | ICD-10-CM | POA: Diagnosis not present

## 2023-02-14 DIAGNOSIS — C787 Secondary malignant neoplasm of liver and intrahepatic bile duct: Secondary | ICD-10-CM | POA: Diagnosis not present

## 2023-02-14 DIAGNOSIS — F17210 Nicotine dependence, cigarettes, uncomplicated: Secondary | ICD-10-CM | POA: Diagnosis not present

## 2023-02-14 DIAGNOSIS — J449 Chronic obstructive pulmonary disease, unspecified: Secondary | ICD-10-CM | POA: Diagnosis not present

## 2023-02-14 DIAGNOSIS — C7951 Secondary malignant neoplasm of bone: Secondary | ICD-10-CM | POA: Insufficient documentation

## 2023-02-14 DIAGNOSIS — K219 Gastro-esophageal reflux disease without esophagitis: Secondary | ICD-10-CM | POA: Diagnosis not present

## 2023-02-14 LAB — CBC WITH DIFFERENTIAL/PLATELET
Abs Immature Granulocytes: 0.01 10*3/uL (ref 0.00–0.07)
Basophils Absolute: 0 10*3/uL (ref 0.0–0.1)
Basophils Relative: 1 %
Eosinophils Absolute: 0.1 10*3/uL (ref 0.0–0.5)
Eosinophils Relative: 2 %
HCT: 46.9 % (ref 39.0–52.0)
Hemoglobin: 15.8 g/dL (ref 13.0–17.0)
Immature Granulocytes: 0 %
Lymphocytes Relative: 18 %
Lymphs Abs: 1.1 10*3/uL (ref 0.7–4.0)
MCH: 29.8 pg (ref 26.0–34.0)
MCHC: 33.7 g/dL (ref 30.0–36.0)
MCV: 88.5 fL (ref 80.0–100.0)
Monocytes Absolute: 0.7 10*3/uL (ref 0.1–1.0)
Monocytes Relative: 11 %
Neutro Abs: 4.1 10*3/uL (ref 1.7–7.7)
Neutrophils Relative %: 68 %
Platelets: 255 10*3/uL (ref 150–400)
RBC: 5.3 MIL/uL (ref 4.22–5.81)
RDW: 14 % (ref 11.5–15.5)
WBC: 6 10*3/uL (ref 4.0–10.5)
nRBC: 0 % (ref 0.0–0.2)

## 2023-02-14 LAB — COMPREHENSIVE METABOLIC PANEL
ALT: 17 U/L (ref 0–44)
AST: 19 U/L (ref 15–41)
Albumin: 4.4 g/dL (ref 3.5–5.0)
Alkaline Phosphatase: 80 U/L (ref 38–126)
Anion gap: 9 (ref 5–15)
BUN: 18 mg/dL (ref 8–23)
CO2: 24 mmol/L (ref 22–32)
Calcium: 9.3 mg/dL (ref 8.9–10.3)
Chloride: 106 mmol/L (ref 98–111)
Creatinine, Ser: 1.07 mg/dL (ref 0.61–1.24)
GFR, Estimated: >60 mL/min (ref >60)
Glucose, Bld: 124 mg/dL — ABNORMAL HIGH (ref 70–99)
Potassium: 4 mmol/L (ref 3.5–5.1)
Sodium: 139 mmol/L (ref 135–145)
Total Bilirubin: 0.5 mg/dL (ref 0.3–1.2)
Total Protein: 7.8 g/dL (ref 6.5–8.1)

## 2023-02-14 MED ORDER — SODIUM CHLORIDE 0.9 % IV SOLN
350.0000 mg | Freq: Once | INTRAVENOUS | Status: AC
  Administered 2023-02-14: 350 mg via INTRAVENOUS
  Filled 2023-02-14: qty 7

## 2023-02-14 MED ORDER — SODIUM CHLORIDE 0.9 % IV SOLN
Freq: Once | INTRAVENOUS | Status: AC
  Filled 2023-02-14: qty 250

## 2023-02-14 NOTE — Assessment & Plan Note (Signed)
#   Non-small cell lung cancer/stage IV-favor adeno carcinoma-metastases to right femur/liver; synchronous squamous cell carcinoma scalp  S/P- Patient currently on maintanence Libatayo.  CT CAP-March, 8th, 2024- CT CAP- Unchanged post treatment appearance of a nodule of the right pulmonary apex; Right lobe hepatic metastasis is almost completely resolved on today's examination, measuring no greater than 0.4 cm. No new lesions;  No evidence of lymphadenopathy or other metastatic disease in the chest, abdomen, or pelvis.  # Proceed with Single agent Libtayo maintenance- Labs today reviewed;  acceptable for treatment today.  DEC 2023- TSH-WNL. Stable.    # BONE METASTASES:  Impending right hip fracture right hip pain-status post radiation [[place]; DEC 2022]- S/p  Re-RT on 10/03 x5 Fx. NOV 2023-status post intramedullary nail fixation due to an impending femur fracture secondary  DEC 2023-bone scan no evidence of any distant bony disease; difficult to assess right femur because of recent surgery.  Awaiting dental clearance. currently on PT- Stable. Reviewed the note from " Reassured him that despite his pain, his right femur is healing appropriately and it would take time for complete healing".    # Low vit D- [July 2023- vit D- 25]; on  ergocalciferol- Calcium 9.5 improved/stable. FEB vit D-25-OH- 41. Stable.   # Squamous cell carcinoma of the scalp-s/p excision; positive margins- on libtayo-no clinical evidence of progression.  Might need to consider radiation-if any local progression noted/also based on course lung cancer. Stable.   # COPD-recommend follow-up with pulmonary, [facility] [place]- Stable.   # GERD- on prilosec continue PPI BID prior to meals- Stable.  # IV access: PIV  # DISPOSITION: #Libtayo;- treatment Today. # as per IS- follow up in 3 weeks- MD;labs- cbc/cmp; Libtayo;TSH- Dr.B

## 2023-02-14 NOTE — Progress Notes (Signed)
[facility] CONSULT NOTE  Patient Care Team: Sherrie Mustache, MD as PCP - General (Internal Medicine) Runell Gess, MD as PCP - Cardiology (Cardiology) [name], MD as Consulting Physician (Oncology) Sherrie Mustache, MD as Referring Physician (Internal Medicine) Earna Coder, MD as Consulting Physician (Internal Medicine) Earna Coder, MD as Consulting Physician (Internal Medicine)  CHIEF COMPLAINTS/PURPOSE OF CONSULTATION: lung cancer  #  Oncology History Overview Note  DIAGNOSIS: 1) stage IV (T1c, N0, M1 C) non-small cell lung cancer favoring adenocarcinoma presented with right lung apical nodule in addition to metastatic disease in the right hepatic lobe and the proximal right femoral diaphysis diagnosed and December 2022. 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    QNS; Guardant 360 showed positive KRAS G12C mutation  FINAL MICROSCOPIC DIAGNOSIS:   A. LUNG, RUL, FINE NEEDLE ASPIRATION:  - Malignant cells consistent with non-small cell carcinoma, see comment   B. LUNG, RUL, BRUSHING:  - Malignant cells consistent with non-small cell carcinoma, see comment       COMMENT:   A and B.  Dr. Luisa Hart reviewed the case and concurs with the diagnosis.  Only rare malignant cells are present on the cellblock.  Immunohistochemical stains were attempted and show that the tumor cells  have patchy staining for TTF-1 whereas p63, p40 and CK5/6 are negative.  The findings are nondiagnostic but suggestive of a lung adenocarcinoma.  Dr. Delton Coombes was notified on 10/13/2021.   SEP-OCT 2022-  [Dermatology]-   Poorly differentiated squamous cell carcinoma; -  Carcinoma extends to the edges of the excision specimen   IMPRESSION: 1. The spiculated nodule at the right lung apex on recent neck CT is hypermetabolic and is concerning for primary bronchogenic  carcinoma. Tissue sampling recommended. 2. Hypermetabolic activity within the occipital scalp and small previously demonstrated right occipital lymph node compatible with known squamous cell carcinoma. Evaluation of the head and neck limited by motion artifact. 3. Hypermetabolic lesions inferiorly in the right hepatic lobe and in the proximal right femoral diaphysis suspicious for metastatic disease, primary uncertain in this patient with a history of prostate cancer. Correlate with PSA levels. Abdominal MRI without and with contrast may be helpful for further characterization of the liver lesion.  # s/p RT tto RUL [[place]]; right hip RT;   # 2007MI-CAD [s/p stent-Dr.Berry; EF 2021- 58%]     Primary adenocarcinoma of upper lobe of right lung  10/18/2021 Initial Diagnosis   Primary adenocarcinoma of upper lobe of right lung (HCC)   10/18/2021 Cancer Staging   Staging form: Lung, AJCC 8th Edition - Clinical: Stage IVB (cT1c, cN0, cM1c) - Signed by [name], MD on 10/18/2021   11/01/2021 - 05/30/2022 Chemotherapy   Patient is on Treatment Plan : LUNG NSCLC Pemetrexed + Carboplatin q21d x 4 Cycles     11/01/2021 -  Chemotherapy   Patient is on Treatment Plan : LUNG NSCLC Libtayo q  21d       HISTORY OF PRESENTING ILLNESS: Ambulating with a cane.  Alone.   Chris [name] 62 y.o.  male metastatic stage IV non-small cell lung cancer/favor adenocarcinoma-currently on maintained libatyo here for follow-up/.   S/p evaluation with orthopedics and [facility]-for his ongoing right hip pain.  Imaging consistent with healing.  Currently recommended physical therapy.  Doing well. Appetite is good. Breathing well.  No nausea no vomiting.  Review of Systems  Constitutional:  Positive for malaise/fatigue. Negative  for chills, diaphoresis, fever and weight loss.  HENT:  Positive for hearing loss. Negative for nosebleeds and sore throat.   Eyes:  Negative for double vision.  Respiratory:   Negative for cough, hemoptysis, sputum production, shortness of breath and wheezing.   Cardiovascular:  Negative for chest pain, palpitations, orthopnea and leg swelling.  Gastrointestinal:  Negative for abdominal pain, blood in stool, diarrhea, heartburn, melena, nausea and vomiting.  Genitourinary:  Negative for dysuria, frequency and urgency.  Musculoskeletal:  Negative for back pain.  Skin: Negative.  Negative for itching and rash.  Neurological:  Negative for dizziness, tingling, focal weakness, weakness and headaches.  Endo/Heme/Allergies:  Does not bruise/bleed easily.  Psychiatric/Behavioral:  Negative for depression. The patient does not have insomnia.      MEDICAL HISTORY:  Past Medical History:  Diagnosis Date   Anxiety    associated with medical care,  worsened by difficulty hearing, does better with wife present   Complication of anesthesia    wife states very anxious may need pre sedation   Coronary artery disease    GERD (gastroesophageal reflux disease)    Hearing loss    mild per wife   Hearing loss    no hearing aids per wife   Hyperlipidemia    Hypertension    MI (myocardial infarction) 04/17/2006   Acute inferolateral wall MI with cardiogenic shock and complete heart block   Primary adenocarcinoma of upper lobe of right lung    Prostate cancer    Tobacco abuse    Wears glasses    Wears partial dentures    Upper    SURGICAL HISTORY: Past Surgical History:  Procedure Laterality Date   BRONCHIAL BIOPSY  10/09/2021   Procedure: BRONCHIAL BIOPSIES;  Surgeon: Leslye Peer, MD;  Location: [facility];  Service: Pulmonary;;   BRONCHIAL BRUSHINGS  10/09/2021   Procedure: BRONCHIAL BRUSHINGS;  Surgeon: Leslye Peer, MD;  Location: [facility] ENDOSCOPY;  Service: Pulmonary;;   BRONCHIAL NEEDLE ASPIRATION BIOPSY  10/09/2021   Procedure: BRONCHIAL NEEDLE ASPIRATION BIOPSIES;  Surgeon: Leslye Peer, MD;  Location: [facility] ENDOSCOPY;  Service: Pulmonary;;   CARDIAC  CATHETERIZATION  2007   left, RCA 100% occluded ruptured plaque with thrombus in the proximal segment   CYST EXCISION N/A 08/30/2021   Procedure: EXCISION OF POSTERIOR SCALP CYST;  Surgeon: Berna Bue, MD;  Location: [facility];  Service: General;  Laterality: N/A;   CYSTOSCOPY N/A 07/17/2018   Procedure: Bluford Kaufmann;  Surgeon: Marcine Matar, MD;  Location: [facility];  Service: Urology;  Laterality: N/A;  no seeds in bladder per Dr Retta Diones   FIDUCIAL MARKER PLACEMENT  10/09/2021   Procedure: FIDUCIAL MARKER PLACEMENT;  Surgeon: Leslye Peer, MD;  Location: [facility] ENDOSCOPY;  Service: Pulmonary;;   HAND SURGERY Right    has metal plate in arm   PROSTATE BIOPSY     RADIOACTIVE SEED IMPLANT N/A 07/17/2018   Procedure: RADIOACTIVE SEED IMPLANT/BRACHYTHERAPY IMPLANT;  Surgeon: Marcine Matar, MD;  Location: [facility];  Service: Urology;  Laterality: N/A;  77 seeds   SPACE OAR INSTILLATION N/A 07/17/2018   Procedure: SPACE OAR INSTILLATION;  Surgeon: Marcine Matar, MD;  Location: [facility];  Service: Urology;  Laterality: N/A;   VIDEO BRONCHOSCOPY WITH RADIAL ENDOBRONCHIAL ULTRASOUND  10/09/2021   Procedure: VIDEO BRONCHOSCOPY WITH RADIAL ENDOBRONCHIAL ULTRASOUND;  Surgeon: Leslye Peer, MD;  Location: [facility];  Service: Pulmonary;;   WRIST SURGERY  10/04/2012    SOCIAL HISTORY:  Social History   Socioeconomic History   Marital status: Married    Spouse name: Not on file   Number of children: Not on file   Years of education: Not on file   Highest education level: Not on file  Occupational History   Not on file  Tobacco Use   Smoking status: Every Day    Packs/day: 1.50    Years: 42.00    Additional pack years: 0.00    Total pack years: 63.00    Types: Cigarettes   Smokeless tobacco: Never  Vaping Use   Vaping Use: Never used  Substance and Sexual Activity   Alcohol use: No   Drug use: No   Sexual  activity: Yes    Birth control/protection: None  Other Topics Concern   Not on file  Social History Narrative   10-04 19 Unable to ask abuse questions wife with him today.   Social Determinants of Health   Financial Resource Strain: Not on file  Food Insecurity: Not on file  Transportation Needs: Not on file  Physical Activity: Not on file  Stress: Not on file  Social Connections: Not on file  Intimate Partner Violence: Not on file    FAMILY HISTORY: Family History  Problem Relation Age of Onset   Breast cancer Mother    Prostate cancer Neg Hx    Kidney cancer Neg Hx    Cancer Neg Hx     ALLERGIES:  has No Known Allergies.  MEDICATIONS:  Current Outpatient Medications  Medication Sig Dispense Refill   ALPRAZolam (XANAX) 0.25 MG tablet Take 1 tablet (0.25 mg total) by mouth at bedtime. 15 tablet 0   atorvastatin (LIPITOR) 20 MG tablet Take 1 tablet (20 mg total) by mouth daily. 90 tablet 3   clopidogrel (PLAVIX) 75 MG tablet Take 1 tablet (75 mg total) by mouth daily. 90 tablet 3   cyclobenzaprine (FLEXERIL) 10 MG tablet Take 1 tablet by mouth 3 (three) times daily as needed.     cyclobenzaprine (FLEXERIL) 10 MG tablet Take by mouth.     ibuprofen (ADVIL) 800 MG tablet Take 800 mg by mouth every 8 (eight) hours as needed.     ketoconazole (NIZORAL) 2 % cream Apply 1 application. topically 2 (two) times daily as needed for irritation.     metoprolol tartrate (LOPRESSOR) 25 MG tablet Take 1 tablet (25 mg total) by mouth daily. 90 tablet 3   pantoprazole (PROTONIX) 40 MG tablet Take 1 tablet (40 mg total) by mouth daily. 60 tablet 1   sertraline (ZOLOFT) 100 MG tablet TAKE 1 TABLET BY MOUTH EVERY DAY 90 tablet 0   tamsulosin (FLOMAX) 0.4 MG CAPS capsule Take 1 capsule (0.4 mg total) by mouth daily. 30 capsule 3   Vitamin D, Ergocalciferol, (DRISDOL) 1.25 MG (50000 UNIT) CAPS capsule TAKE 1 CAPSULE BY MOUTH ONE TIME PER WEEK 12 capsule 1   No current facility-administered  medications for this visit.   Facility-Administered Medications Ordered in Other Visits  Medication Dose Route Frequency Provider Last Rate Last Admin   0.9 %  sodium chloride infusion   Intravenous Once Louretta Shorten R, MD       cemiplimab-rwlc (LIBTAYO) 350 mg in sodium chloride 0.9 % 100 mL chemo infusion  350 mg Intravenous Once Earna Coder, MD          .  PHYSICAL EXAMINATION: ECOG PERFORMANCE STATUS: 1 - Symptomatic but completely ambulatory  Vitals:   02/14/23 0908  BP: 130/84  Pulse: 70  Resp: 18  Temp: 97.7 F (36.5 C)  SpO2: 98%       Filed Weights   02/14/23 0908  Weight: 243 lb (110.2 kg)        Physical Exam Vitals and nursing note reviewed.  HENT:     Head: Normocephalic and atraumatic.     Mouth/Throat:     Pharynx: Oropharynx is clear.  Eyes:     Extraocular Movements: Extraocular movements intact.     Pupils: Pupils are equal, round, and reactive to light.  Cardiovascular:     Rate and Rhythm: Normal rate and regular rhythm.  Pulmonary:     Comments: Decreased breath sounds bilaterally.  Abdominal:     Palpations: Abdomen is soft.  Musculoskeletal:        General: Normal range of motion.     Cervical back: Normal range of motion.  Skin:    General: Skin is warm.  Neurological:     General: No focal deficit present.     Mental Status: He is alert and oriented to person, place, and time.  Psychiatric:        Behavior: Behavior normal.        Judgment: Judgment normal.      LABORATORY DATA:  I have reviewed the data as listed Lab Results  Component Value Date   WBC 6.0 02/14/2023   HGB 15.8 02/14/2023   HCT 46.9 02/14/2023   MCV 88.5 02/14/2023   PLT 255 02/14/2023   Recent Labs    12/19/22 0903 01/09/23 0900 02/14/23 0856  NA 140 134* 139  K 4.5 3.8 4.0  CL 103 104 106  CO2 27 22 24   GLUCOSE 111* 111* 124*  BUN 22 19 18   CREATININE 1.12 0.92 1.07  CALCIUM 9.4 9.1 9.3  GFRNONAA >60 >60 >60  PROT  7.8 7.6 7.8  ALBUMIN 4.5 4.4 4.4  AST 18 22 19   ALT 18 22 17   ALKPHOS 75 69 80  BILITOT 0.4 0.6 0.5    RADIOGRAPHIC STUDIES: I have personally reviewed the radiological images as listed and agreed with the findings in the report. No results found.  ASSESSMENT & PLAN:   Primary adenocarcinoma of upper lobe of right lung (HCC) # Non-small cell lung cancer/stage IV-favor adeno carcinoma-metastases to right femur/liver; synchronous squamous cell carcinoma scalp  S/P- Patient currently on maintanence Libatayo.  CT CAP-March, 8th, 2024- CT CAP- Unchanged post treatment appearance of a nodule of the right pulmonary apex; Right lobe hepatic metastasis is almost completely resolved on today's examination, measuring no greater than 0.4 cm. No new lesions;  No evidence of lymphadenopathy or other metastatic disease in the chest, abdomen, or pelvis.  # Proceed with Single agent Libtayo maintenance- Labs today reviewed;  acceptable for treatment today.  DEC 2023- TSH-WNL. Stable.    # BONE METASTASES:  Impending right hip fracture right hip pain-status post radiation [[place]; DEC 2022]- S/p  Re-RT on 10/03 x5 Fx. NOV 2023-status post intramedullary nail fixation due to an impending femur fracture secondary  DEC 2023-bone scan no evidence of any distant bony disease; difficult to assess right femur because of recent surgery.  Awaiting dental clearance. currently on PT- Stable. Reviewed the note from " Reassured him that despite his pain, his right femur is healing appropriately and it would take time for complete healing".    # Low vit D- [July 2023- vit D- 25]; on  ergocalciferol- Calcium 9.5 improved/stable. FEB vit D-25-OH- 41. Stable.   #  Squamous cell carcinoma of the scalp-s/p excision; positive margins- on libtayo-no clinical evidence of progression.  Might need to consider radiation-if any local progression noted/also based on course lung cancer. Stable.   # COPD-recommend follow-up with pulmonary,  [facility] [place]- Stable.   # GERD- on prilosec continue PPI BID prior to meals- Stable.  # IV access: PIV  # DISPOSITION: #Libtayo;- treatment Today. # as per IS- follow up in 3 weeks- MD;labs- cbc/cmp; Libtayo;TSH- Dr.B   All questions were answered. The patient knows to call the clinic with any problems, questions or concerns.    Earna Coder, MD 02/14/2023 10:00 AM

## 2023-02-14 NOTE — Patient Instructions (Signed)
[facility]  Discharge Instructions: Thank you for choosing [facility] to provide your oncology and hematology care.  If you have a lab appointment with the Cancer Center, please go directly to the Cancer Center and check in at the registration area.  Wear comfortable clothing and clothing appropriate for easy access to any Portacath or PICC line.   We strive to give you quality time with your provider. You may need to reschedule your appointment if you arrive late (15 or more minutes).  Arriving late affects you and other patients whose appointments are after yours.  Also, if you miss three or more appointments without notifying the office, you may be dismissed from the clinic at the provider's discretion.      For prescription refill requests, have your pharmacy contact our office and allow 72 hours for refills to be completed.    Today you received the following chemotherapy and/or immunotherapy agents- Libtayo      To help prevent nausea and vomiting after your treatment, we encourage you to take your nausea medication as directed.  BELOW ARE SYMPTOMS THAT SHOULD BE REPORTED IMMEDIATELY: *FEVER GREATER THAN 100.4 F (38 C) OR HIGHER *CHILLS OR SWEATING *NAUSEA AND VOMITING THAT IS NOT CONTROLLED WITH YOUR NAUSEA MEDICATION *UNUSUAL SHORTNESS OF BREATH *UNUSUAL BRUISING OR BLEEDING *URINARY PROBLEMS (pain or burning when urinating, or frequent urination) *BOWEL PROBLEMS (unusual diarrhea, constipation, pain near the anus) TENDERNESS IN MOUTH AND THROAT WITH OR WITHOUT PRESENCE OF ULCERS (sore throat, sores in mouth, or a toothache) UNUSUAL RASH, SWELLING OR PAIN  UNUSUAL VAGINAL DISCHARGE OR ITCHING   Items with * indicate a potential emergency and should be followed up as soon as possible or go to the Emergency Department if any problems should occur.  Please show the CHEMOTHERAPY ALERT CARD or IMMUNOTHERAPY ALERT CARD at check-in to  the Emergency Department and triage nurse.  Should you have questions after your visit or need to cancel or reschedule your appointment, please contact [facility] [facility]  [phone] and follow the prompts.  Office hours are 8:00 a.m. to 4:30 p.m. Monday - Friday. Please note that voicemails left after 4:00 p.m. may not be returned until the following business day.  We are closed weekends and major holidays. You have access to a nurse at all times for urgent questions. Please call the main number to the clinic [phone] and follow the prompts.  For any non-urgent questions, you may also contact your provider using MyChart. We now offer e-Visits for anyone 18 and older to request care online for non-urgent symptoms. For details visit [email or web address].   Also download the MyChart app! Go to the app store, search "MyChart", open the app, select [facility], and log in with your MyChart username and password.    

## 2023-02-19 DIAGNOSIS — M25651 Stiffness of right hip, not elsewhere classified: Secondary | ICD-10-CM | POA: Diagnosis not present

## 2023-02-19 DIAGNOSIS — R260 Ataxic gait: Secondary | ICD-10-CM | POA: Diagnosis not present

## 2023-02-19 DIAGNOSIS — M6281 Muscle weakness (generalized): Secondary | ICD-10-CM | POA: Diagnosis not present

## 2023-02-19 DIAGNOSIS — M25551 Pain in right hip: Secondary | ICD-10-CM | POA: Diagnosis not present

## 2023-02-21 ENCOUNTER — Encounter: Payer: Self-pay | Admitting: Hospice and Palliative Medicine

## 2023-02-21 DIAGNOSIS — M25551 Pain in right hip: Secondary | ICD-10-CM | POA: Diagnosis not present

## 2023-02-21 DIAGNOSIS — R260 Ataxic gait: Secondary | ICD-10-CM | POA: Diagnosis not present

## 2023-02-21 DIAGNOSIS — M25651 Stiffness of right hip, not elsewhere classified: Secondary | ICD-10-CM | POA: Diagnosis not present

## 2023-02-21 DIAGNOSIS — M6281 Muscle weakness (generalized): Secondary | ICD-10-CM | POA: Diagnosis not present

## 2023-02-22 ENCOUNTER — Telehealth: Payer: Self-pay | Admitting: *Deleted

## 2023-02-22 NOTE — Telephone Encounter (Signed)
Spoke with patient's wife to f/u on mychart msg to [place], NP. Patient had decreased mobility/weakness and increase pain systemically. He has had some memory changes as well. I offered in person or mychart visit with Josh np today. Wife declined stated that she sent the message yesterday in desperation as patient had horrible pain. She has since reached out to the surgeon and waiting on recommendations as well. She would like her husband to see josh Wednesday afternoon next week in person for palliative care follow-up. If anything changes, she will let us know.

## 2023-02-25 DIAGNOSIS — R260 Ataxic gait: Secondary | ICD-10-CM | POA: Diagnosis not present

## 2023-02-25 DIAGNOSIS — M25651 Stiffness of right hip, not elsewhere classified: Secondary | ICD-10-CM | POA: Diagnosis not present

## 2023-02-25 DIAGNOSIS — M6281 Muscle weakness (generalized): Secondary | ICD-10-CM | POA: Diagnosis not present

## 2023-02-25 DIAGNOSIS — M25551 Pain in right hip: Secondary | ICD-10-CM | POA: Diagnosis not present

## 2023-02-27 ENCOUNTER — Encounter: Payer: Self-pay | Admitting: Hospice and Palliative Medicine

## 2023-02-27 ENCOUNTER — Other Ambulatory Visit: Payer: Self-pay

## 2023-02-27 ENCOUNTER — Inpatient Hospital Stay: Payer: BC Managed Care – PPO | Attending: Internal Medicine | Admitting: Hospice and Palliative Medicine

## 2023-02-27 VITALS — BP 97/79 | HR 77 | Temp 97.9°F | Resp 18 | Ht 71.0 in | Wt 243.0 lb

## 2023-02-27 DIAGNOSIS — M545 Low back pain, unspecified: Secondary | ICD-10-CM | POA: Insufficient documentation

## 2023-02-27 DIAGNOSIS — G893 Neoplasm related pain (acute) (chronic): Secondary | ICD-10-CM | POA: Diagnosis not present

## 2023-02-27 DIAGNOSIS — Z515 Encounter for palliative care: Secondary | ICD-10-CM

## 2023-02-27 DIAGNOSIS — C3411 Malignant neoplasm of upper lobe, right bronchus or lung: Secondary | ICD-10-CM | POA: Insufficient documentation

## 2023-02-27 DIAGNOSIS — C787 Secondary malignant neoplasm of liver and intrahepatic bile duct: Secondary | ICD-10-CM | POA: Insufficient documentation

## 2023-02-27 DIAGNOSIS — F17210 Nicotine dependence, cigarettes, uncomplicated: Secondary | ICD-10-CM | POA: Diagnosis not present

## 2023-02-27 DIAGNOSIS — C61 Malignant neoplasm of prostate: Secondary | ICD-10-CM | POA: Diagnosis not present

## 2023-02-27 DIAGNOSIS — Z79899 Other long term (current) drug therapy: Secondary | ICD-10-CM | POA: Insufficient documentation

## 2023-02-27 DIAGNOSIS — I2510 Atherosclerotic heart disease of native coronary artery without angina pectoris: Secondary | ICD-10-CM | POA: Diagnosis not present

## 2023-02-27 DIAGNOSIS — F419 Anxiety disorder, unspecified: Secondary | ICD-10-CM | POA: Diagnosis not present

## 2023-02-27 DIAGNOSIS — C7951 Secondary malignant neoplasm of bone: Secondary | ICD-10-CM | POA: Diagnosis not present

## 2023-02-27 DIAGNOSIS — R5383 Other fatigue: Secondary | ICD-10-CM | POA: Diagnosis not present

## 2023-02-27 DIAGNOSIS — Z9221 Personal history of antineoplastic chemotherapy: Secondary | ICD-10-CM | POA: Diagnosis not present

## 2023-02-27 MED ORDER — TRAMADOL HCL 50 MG PO TABS: 50.0000 mg | ORAL_TABLET | Freq: Four times a day (QID) | ORAL | 0 refills | Status: DC | PRN

## 2023-02-27 MED ORDER — GABAPENTIN 100 MG PO CAPS: 100.0000 mg | ORAL_CAPSULE | Freq: Two times a day (BID) | ORAL | 1 refills | Status: DC

## 2023-02-27 NOTE — Addendum Note (Signed)
Addended by: Laurette Schimke R on: 02/27/2023 04:04 PM   Modules accepted: Orders

## 2023-02-27 NOTE — Progress Notes (Signed)
Palliative Medicine [facility] [phone] [phone]   Name: Chris [name] Date: 02/27/2023 MRN: [id number]  DOB: [birth year]  Patient Care Team: Sherrie Mustache, MD as PCP - General (Internal Medicine) Runell Gess, MD as PCP - Cardiology (Cardiology) [name], MD as Consulting Physician (Oncology) Sherrie Mustache, MD as Referring Physician (Internal Medicine) Earna Coder, MD as Consulting Physician (Internal Medicine) Earna Coder, MD as Consulting Physician (Internal Medicine)    REASON FOR CONSULTATION: Chris [name] is a 62 y.o. male with multiple medical problems including stage IV non-small cell lung cancer with metastasis to femur/liver diagnosed in December 2022.  Patient is status post systemic chemotherapy and most recently on maintenance single agent Libtayo. He is status post right hip XRT.  Patient was found to have impending pathologic fracture of the right femur and underwent surgical pinning at [facility] by Ortho oncology.  SOCIAL HISTORY:     reports that he has been smoking cigarettes. He has a 63.00 pack-year smoking history. He has never used smokeless tobacco. He reports that he does not drink alcohol and does not use drugs.  Patient is married and lives at home with his wife.  They have a son and recently had grandchildren.  Patient works in [employer].  ADVANCE DIRECTIVES:  Not on file  CODE STATUS: Limited Code (CPR but no intubation, MOST form signed on 04/04/22)  PAST MEDICAL HISTORY: Past Medical History:  Diagnosis Date   Anxiety    associated with medical care,  worsened by difficulty hearing, does better with wife present   Complication of anesthesia    wife states very anxious may need pre sedation   Coronary artery disease    GERD (gastroesophageal reflux disease)    Hearing loss    mild per wife   Hearing loss    no hearing aids per wife    Hyperlipidemia    Hypertension    MI (myocardial infarction) (HCC) 04/17/2006   Acute inferolateral wall MI with cardiogenic shock and complete heart block   Primary adenocarcinoma of upper lobe of right lung (HCC)    Prostate cancer (HCC)    Tobacco abuse    Wears glasses    Wears partial dentures    Upper    PAST SURGICAL HISTORY:  Past Surgical History:  Procedure Laterality Date   BRONCHIAL BIOPSY  10/09/2021   Procedure: BRONCHIAL BIOPSIES;  Surgeon: Leslye Peer, MD;  Location: [facility] ENDOSCOPY;  Service: Pulmonary;;   BRONCHIAL BRUSHINGS  10/09/2021   Procedure: BRONCHIAL BRUSHINGS;  Surgeon: Leslye Peer, MD;  Location: [facility] ENDOSCOPY;  Service: Pulmonary;;   BRONCHIAL NEEDLE ASPIRATION BIOPSY  10/09/2021   Procedure: BRONCHIAL NEEDLE ASPIRATION BIOPSIES;  Surgeon: Leslye Peer, MD;  Location: [facility] ENDOSCOPY;  Service: Pulmonary;;   CARDIAC CATHETERIZATION  2007   left, RCA 100% occluded ruptured plaque with thrombus in the proximal segment   CYST EXCISION N/A 08/30/2021   Procedure: EXCISION OF POSTERIOR SCALP CYST;  Surgeon: Berna Bue, MD;  Location: [facility] ORS;  Service: General;  Laterality: N/A;   CYSTOSCOPY N/A 07/17/2018   Procedure: Bluford Kaufmann;  Surgeon: Marcine Matar, MD;  Location: [facility];  Service: Urology;  Laterality: N/A;  no seeds in bladder per Dr Retta Diones   FIDUCIAL MARKER PLACEMENT  10/09/2021   Procedure: FIDUCIAL MARKER PLACEMENT;  Surgeon: Leslye Peer, MD;  Location: [facility] ENDOSCOPY;  Service: Pulmonary;;   HAND SURGERY Right  has metal plate in arm   PROSTATE BIOPSY     RADIOACTIVE SEED IMPLANT N/A 07/17/2018   Procedure: RADIOACTIVE SEED IMPLANT/BRACHYTHERAPY IMPLANT;  Surgeon: Marcine Matar, MD;  Location: [facility];  Service: Urology;  Laterality: N/A;  77 seeds   SPACE OAR INSTILLATION N/A 07/17/2018   Procedure: SPACE OAR INSTILLATION;  Surgeon: Marcine Matar, MD;  Location: [facility];  Service: Urology;  Laterality: N/A;   VIDEO BRONCHOSCOPY WITH RADIAL ENDOBRONCHIAL ULTRASOUND  10/09/2021   Procedure: VIDEO BRONCHOSCOPY WITH RADIAL ENDOBRONCHIAL ULTRASOUND;  Surgeon: Leslye Peer, MD;  Location: [facility] ENDOSCOPY;  Service: Pulmonary;;   WRIST SURGERY  10/04/2012    HEMATOLOGY/ONCOLOGY HISTORY:  Oncology History Overview Note  DIAGNOSIS: 1) stage IV (T1c, N0, M1 C) non-small cell lung cancer favoring adenocarcinoma presented with right lung apical nodule in addition to metastatic disease in the right hepatic lobe and the proximal right femoral diaphysis diagnosed and December 2022. 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    QNS; Guardant 360 showed positive KRAS G12C mutation  FINAL MICROSCOPIC DIAGNOSIS:   A. LUNG, RUL, FINE NEEDLE ASPIRATION:  - Malignant cells consistent with non-small cell carcinoma, see comment   B. LUNG, RUL, BRUSHING:  - Malignant cells consistent with non-small cell carcinoma, see comment       COMMENT:   A and B.  Dr. Luisa Hart reviewed the case and concurs with the diagnosis.  Only rare malignant cells are present on the cellblock.  Immunohistochemical stains were attempted and show that the tumor cells  have patchy staining for TTF-1 whereas p63, p40 and CK5/6 are negative.  The findings are nondiagnostic but suggestive of a lung adenocarcinoma.  Dr. Delton Coombes was notified on 10/13/2021.   SEP-OCT 2022-  [Dermatology]-   Poorly differentiated squamous cell carcinoma; -  Carcinoma extends to the edges of the excision specimen   IMPRESSION: 1. The spiculated nodule at the right lung apex on recent neck CT is hypermetabolic and is concerning for primary bronchogenic carcinoma. Tissue sampling recommended. 2. Hypermetabolic activity within the occipital scalp and small previously demonstrated right occipital lymph node compatible  with known squamous cell carcinoma. Evaluation of the head and neck limited by motion artifact. 3. Hypermetabolic lesions inferiorly in the right hepatic lobe and in the proximal right femoral diaphysis suspicious for metastatic disease, primary uncertain in this patient with a history of prostate cancer. Correlate with PSA levels. Abdominal MRI without and with contrast may be helpful for further characterization of the liver lesion.  # s/p RT tto RUL [[place]]; right hip RT;   # 2007MI-CAD [s/p stent-Dr.Berry; EF 2021- 58%]     Primary adenocarcinoma of upper lobe of right lung (HCC)  10/18/2021 Initial Diagnosis   Primary adenocarcinoma of upper lobe of right lung (HCC)   10/18/2021 Cancer Staging   Staging form: Lung, AJCC 8th Edition - Clinical: Stage IVB (cT1c, cN0, cM1c) - Signed by [name], MD on 10/18/2021   11/01/2021 - 05/30/2022 Chemotherapy   Patient is on Treatment Plan : LUNG NSCLC Pemetrexed + Carboplatin q21d x 4 Cycles     11/01/2021 -  Chemotherapy   Patient is on Treatment Plan : LUNG NSCLC Libtayo q  21d        ALLERGIES:  has No Known Allergies.  MEDICATIONS:  Current Outpatient Medications  Medication Sig Dispense Refill   ALPRAZolam (XANAX) 0.25 MG tablet Take 1 tablet (0.25 mg  total) by mouth at bedtime. 15 tablet 0   atorvastatin (LIPITOR) 20 MG tablet Take 1 tablet (20 mg total) by mouth daily. 90 tablet 3   clopidogrel (PLAVIX) 75 MG tablet Take 1 tablet (75 mg total) by mouth daily. 90 tablet 3   cyclobenzaprine (FLEXERIL) 10 MG tablet Take 1 tablet by mouth 3 (three) times daily as needed.     cyclobenzaprine (FLEXERIL) 10 MG tablet Take by mouth.     ibuprofen (ADVIL) 800 MG tablet Take 800 mg by mouth every 8 (eight) hours as needed.     ketoconazole (NIZORAL) 2 % cream Apply 1 application. topically 2 (two) times daily as needed for irritation.     metoprolol tartrate (LOPRESSOR) 25 MG tablet Take 1 tablet (25 mg total) by mouth daily. 90  tablet 3   pantoprazole (PROTONIX) 40 MG tablet Take 1 tablet (40 mg total) by mouth daily. 60 tablet 1   sertraline (ZOLOFT) 100 MG tablet TAKE 1 TABLET BY MOUTH EVERY DAY 90 tablet 0   tamsulosin (FLOMAX) 0.4 MG CAPS capsule Take 1 capsule (0.4 mg total) by mouth daily. 30 capsule 3   Vitamin D, Ergocalciferol, (DRISDOL) 1.25 MG (50000 UNIT) CAPS capsule TAKE 1 CAPSULE BY MOUTH ONE TIME PER WEEK 12 capsule 1   No current facility-administered medications for this visit.    VITAL SIGNS: There were no vitals taken for this visit. There were no vitals filed for this visit.  Estimated body mass index is 33.89 kg/m as calculated from the following:   Height as of 02/14/23: 5\' 11"  (1.803 m).   Weight as of 02/14/23: 243 lb (110.2 kg).  LABS: CBC:    Component Value Date/Time   WBC 6.0 02/14/2023 0856   HGB 15.8 02/14/2023 0856   HGB 16.4 10/31/2021 1107   HCT 46.9 02/14/2023 0856   PLT 255 02/14/2023 0856   PLT 217 10/31/2021 1107   MCV 88.5 02/14/2023 0856   NEUTROABS 4.1 02/14/2023 0856   LYMPHSABS 1.1 02/14/2023 0856   MONOABS 0.7 02/14/2023 0856   EOSABS 0.1 02/14/2023 0856   BASOSABS 0.0 02/14/2023 0856   Comprehensive Metabolic Panel:    Component Value Date/Time   NA 139 02/14/2023 0856   K 4.0 02/14/2023 0856   CL 106 02/14/2023 0856   CO2 24 02/14/2023 0856   BUN 18 02/14/2023 0856   CREATININE 1.07 02/14/2023 0856   CREATININE 1.08 10/31/2021 1107   GLUCOSE 124 (H) 02/14/2023 0856   CALCIUM 9.3 02/14/2023 0856   AST 19 02/14/2023 0856   AST 15 10/31/2021 1107   ALT 17 02/14/2023 0856   ALT 17 10/31/2021 1107   ALKPHOS 80 02/14/2023 0856   BILITOT 0.5 02/14/2023 0856   BILITOT 0.7 10/31/2021 1107   PROT 7.8 02/14/2023 0856   ALBUMIN 4.4 02/14/2023 0856    RADIOGRAPHIC STUDIES: No results found.  PERFORMANCE STATUS (ECOG) : 1 - Symptomatic but completely ambulatory  Review of Systems Unless otherwise noted, a complete review of systems is  negative.  Physical Exam General: NAD Pulmonary: Unlabored Extremities: no edema, no joint deformities Skin: Scaly, erythematous rash beneath pannus Neurological: Weakness but otherwise nonfocal  IMPRESSION: Patient seen today at his request to discuss pain management.  Patient continues to endorse low back pain, which he describes as radiating down his right leg.  He describes pain as feeling like electricity and is at times associated with numbness.  He is working with physical therapy and reports that that has helped some.  He says pain is at times as severe as 9 out of 10 but will decrease to 4-5 out of 10 after he takes a tramadol.  He is currently only taking tramadol twice daily as needed.  Additionally, he is utilizing ibuprofen 5-6 times daily.  Discussed that I was concerned that he is overusing ibuprofen.  Suggested that he can maximize use of tramadol up to every 6 hours as needed.  We could consider rotating to Norco if that is ineffective.  As patient appears to be having neuropathic symptoms, could trial gabapentin.  Patient's description of symptoms suggest radiculopathy of the lumbar spine.  Recent CTs did not show any residual osseous metastatic disease.  Will obtain MRI of the lumbar and sacral spine.  PLAN: -Continue current scope of treatment -MRI lumbar and sacral spine -Maximize tramadol 50 to 100 mg every 6 hours as needed (avoid taking daytime hours if working) -Start gabapentin 100 mg twice daily (avoid taking daytime hours if working) -Continue Zoloft/alprazolam -Follow-up telephone visit 3 to 4 weeks  Case and plan discussed with Dr. Donneta Romberg   Patient expressed understanding and was in agreement with this plan. He also understands that He can call the clinic at any time with any questions, concerns, or complaints.     Time Total: 20 minutes  Visit consisted of counseling and education dealing with the complex and emotionally intense issues of symptom  management and palliative care in the setting of serious and potentially life-threatening illness.Greater than 50%  of this time was spent counseling and coordinating care related to the above assessment and plan.  Signed by: Laurette Schimke, PhD, NP-C

## 2023-02-28 DIAGNOSIS — M25551 Pain in right hip: Secondary | ICD-10-CM | POA: Diagnosis not present

## 2023-02-28 DIAGNOSIS — M25651 Stiffness of right hip, not elsewhere classified: Secondary | ICD-10-CM | POA: Diagnosis not present

## 2023-02-28 DIAGNOSIS — R260 Ataxic gait: Secondary | ICD-10-CM | POA: Diagnosis not present

## 2023-02-28 DIAGNOSIS — M6281 Muscle weakness (generalized): Secondary | ICD-10-CM | POA: Diagnosis not present

## 2023-03-01 ENCOUNTER — Other Ambulatory Visit: Payer: Self-pay | Admitting: Cardiovascular Disease

## 2023-03-05 DIAGNOSIS — R260 Ataxic gait: Secondary | ICD-10-CM | POA: Diagnosis not present

## 2023-03-05 DIAGNOSIS — M25551 Pain in right hip: Secondary | ICD-10-CM | POA: Diagnosis not present

## 2023-03-05 DIAGNOSIS — M25651 Stiffness of right hip, not elsewhere classified: Secondary | ICD-10-CM | POA: Diagnosis not present

## 2023-03-05 DIAGNOSIS — M6281 Muscle weakness (generalized): Secondary | ICD-10-CM | POA: Diagnosis not present

## 2023-03-07 ENCOUNTER — Inpatient Hospital Stay: Payer: BC Managed Care – PPO

## 2023-03-07 ENCOUNTER — Encounter: Payer: Self-pay | Admitting: Hospice and Palliative Medicine

## 2023-03-07 ENCOUNTER — Inpatient Hospital Stay: Payer: BC Managed Care – PPO | Admitting: Internal Medicine

## 2023-03-07 ENCOUNTER — Ambulatory Visit
Admission: RE | Admit: 2023-03-07 | Discharge: 2023-03-07 | Disposition: A | Discharge status: Home / Self Care | Payer: BC Managed Care – PPO | Source: Ambulatory Visit | Attending: Hospice and Palliative Medicine | Admitting: Hospice and Palliative Medicine

## 2023-03-07 ENCOUNTER — Telehealth: Payer: Self-pay | Admitting: *Deleted

## 2023-03-07 ENCOUNTER — Inpatient Hospital Stay (HOSPITAL_BASED_OUTPATIENT_CLINIC_OR_DEPARTMENT_OTHER): Payer: BC Managed Care – PPO | Admitting: Hospice and Palliative Medicine

## 2023-03-07 ENCOUNTER — Inpatient Hospital Stay: Payer: BC Managed Care – PPO | Admitting: Hospice and Palliative Medicine

## 2023-03-07 ENCOUNTER — Other Ambulatory Visit: Payer: Self-pay

## 2023-03-07 ENCOUNTER — Encounter: Payer: Self-pay | Admitting: Internal Medicine

## 2023-03-07 ENCOUNTER — Other Ambulatory Visit: Payer: Self-pay | Admitting: *Deleted

## 2023-03-07 VITALS — BP 114/79 | HR 80 | Temp 98.5°F | Resp 20 | Ht 71.0 in | Wt 243.0 lb

## 2023-03-07 DIAGNOSIS — C61 Malignant neoplasm of prostate: Secondary | ICD-10-CM | POA: Diagnosis not present

## 2023-03-07 DIAGNOSIS — C3411 Malignant neoplasm of upper lobe, right bronchus or lung: Secondary | ICD-10-CM

## 2023-03-07 DIAGNOSIS — G893 Neoplasm related pain (acute) (chronic): Secondary | ICD-10-CM | POA: Insufficient documentation

## 2023-03-07 DIAGNOSIS — R5383 Other fatigue: Secondary | ICD-10-CM | POA: Diagnosis not present

## 2023-03-07 DIAGNOSIS — C787 Secondary malignant neoplasm of liver and intrahepatic bile duct: Secondary | ICD-10-CM | POA: Diagnosis not present

## 2023-03-07 DIAGNOSIS — K5730 Diverticulosis of large intestine without perforation or abscess without bleeding: Secondary | ICD-10-CM | POA: Diagnosis not present

## 2023-03-07 DIAGNOSIS — M545 Low back pain, unspecified: Secondary | ICD-10-CM | POA: Diagnosis not present

## 2023-03-07 DIAGNOSIS — F419 Anxiety disorder, unspecified: Secondary | ICD-10-CM | POA: Diagnosis not present

## 2023-03-07 DIAGNOSIS — Z9221 Personal history of antineoplastic chemotherapy: Secondary | ICD-10-CM | POA: Diagnosis not present

## 2023-03-07 DIAGNOSIS — Z79899 Other long term (current) drug therapy: Secondary | ICD-10-CM | POA: Diagnosis not present

## 2023-03-07 DIAGNOSIS — M5416 Radiculopathy, lumbar region: Secondary | ICD-10-CM | POA: Diagnosis not present

## 2023-03-07 DIAGNOSIS — C7951 Secondary malignant neoplasm of bone: Secondary | ICD-10-CM | POA: Diagnosis not present

## 2023-03-07 DIAGNOSIS — I2510 Atherosclerotic heart disease of native coronary artery without angina pectoris: Secondary | ICD-10-CM | POA: Diagnosis not present

## 2023-03-07 DIAGNOSIS — F17210 Nicotine dependence, cigarettes, uncomplicated: Secondary | ICD-10-CM | POA: Diagnosis not present

## 2023-03-07 LAB — CMP (CANCER CENTER ONLY)
ALT: 19 U/L (ref 0–44)
AST: 20 U/L (ref 15–41)
Albumin: 4.2 g/dL (ref 3.5–5.0)
Alkaline Phosphatase: 66 U/L (ref 38–126)
Anion gap: 10 (ref 5–15)
BUN: 17 mg/dL (ref 8–23)
CO2: 22 mmol/L (ref 22–32)
Calcium: 9.4 mg/dL (ref 8.9–10.3)
Chloride: 105 mmol/L (ref 98–111)
Creatinine: 0.99 mg/dL (ref 0.61–1.24)
GFR, Estimated: >60 mL/min (ref >60)
Glucose, Bld: 119 mg/dL — ABNORMAL HIGH (ref 70–99)
Potassium: 3.5 mmol/L (ref 3.5–5.1)
Sodium: 137 mmol/L (ref 135–145)
Total Bilirubin: 0.7 mg/dL (ref 0.3–1.2)
Total Protein: 7.9 g/dL (ref 6.5–8.1)

## 2023-03-07 LAB — CBC WITH DIFFERENTIAL/PLATELET
Abs Immature Granulocytes: 0.01 10*3/uL (ref 0.00–0.07)
Basophils Absolute: 0 10*3/uL (ref 0.0–0.1)
Basophils Relative: 1 %
Eosinophils Absolute: 0.1 10*3/uL (ref 0.0–0.5)
Eosinophils Relative: 2 %
HCT: 45.9 % (ref 39.0–52.0)
Hemoglobin: 15.5 g/dL (ref 13.0–17.0)
Immature Granulocytes: 0 %
Lymphocytes Relative: 20 %
Lymphs Abs: 1 10*3/uL (ref 0.7–4.0)
MCH: 29.6 pg (ref 26.0–34.0)
MCHC: 33.8 g/dL (ref 30.0–36.0)
MCV: 87.6 fL (ref 80.0–100.0)
Monocytes Absolute: 0.6 10*3/uL (ref 0.1–1.0)
Monocytes Relative: 11 %
Neutro Abs: 3.4 10*3/uL (ref 1.7–7.7)
Neutrophils Relative %: 66 %
Platelets: 217 10*3/uL (ref 150–400)
RBC: 5.24 MIL/uL (ref 4.22–5.81)
RDW: 13.9 % (ref 11.5–15.5)
WBC: 5.2 10*3/uL (ref 4.0–10.5)
nRBC: 0 % (ref 0.0–0.2)

## 2023-03-07 MED ORDER — SODIUM CHLORIDE 0.9 % IV SOLN
Freq: Once | INTRAVENOUS | Status: AC
  Filled 2023-03-07: qty 250

## 2023-03-07 MED ORDER — GADOBUTROL 1 MMOL/ML IV SOLN
10.0000 mL | Freq: Once | INTRAVENOUS | Status: AC | PRN
  Administered 2023-03-07: 10 mL via INTRAVENOUS

## 2023-03-07 NOTE — Telephone Encounter (Signed)
I reached out patient's wife to discuss her concerns. Wife stated pt declines chemotherapy treatment today. And feels pt would benefit from iv fluids. She wants her husband to have apt closer to mri apt if possible. Wife reports that pt's pain is unbearable in the legs/hips. Pt's "lethargy" is a key concern of wife.  Wife took bp 150/90; HR 96; pulse ox 98% ra. I asked her to clarify what she means as "lethargy" Wife said pt is just very tired/ he has no confusion, no shortness of breath or chest pain.. pt does looks pale. She doesn't know if the gabapentin in causing the fatigue. pt is still trying to work a job. He doesn't want to take the tramadol during the day. He takes the tramadol in the am. Pt still in the bed. Wife doesn't feel like she can get the patient out of bed for the apts this morning and it would take some time to get pt ambulating. I offered pt a virtual visit with Josh this morning. However pt/wife declines virtual visit and prefers in person visit this afternoon. Apts provided for 1pm today.   Wife inquired if we admin iv fluids if patient's symptoms would improve. I explained to her that we will determine if pt needs IV fluids after we evaluate patient and his lab values. I explained to her that IV fluids may or may not make pt feel better. Wife gave v/o understanding of the plan of care.   Josh, NP spoke with Dr. Donneta Romberg-.  Dr. B recommends that [facility] evaluate pt in [place]. Ok to take pt off Dr. Senaida Lange sch for today. Per Dr. Leonard Schwartz- md would like to check an AM Cortisol level, thyroid profile and testosterone and ACTH with labs today.  Chemotherapy RNs/pharmacy teams aware that pt declines chemo today.  Per Sharia Reeve, NP- since pt is coming at 1pm today, the AM cortisol lab would not be an accurate level. Would have to be drawn on another day in early am.

## 2023-03-07 NOTE — Progress Notes (Signed)
Symptom Management Clinic [facility] at [facility] [phone] [phone]  Patient Care Team: Sherrie Mustache, MD as PCP - General (Internal Medicine) Runell Gess, MD as PCP - Cardiology (Cardiology) [name], MD as Consulting Physician (Oncology) Sherrie Mustache, MD as Referring Physician (Internal Medicine) Earna Coder, MD as Consulting Physician (Internal Medicine) Earna Coder, MD as Consulting Physician (Internal Medicine)   NAME OF PATIENT: Chris [name]  [id number]  23-Feb-1961   DATE OF VISIT: 03/07/23  REASON FOR CONSULT: SHAKER SARKER is a 62 y.o. male with multiple medical problems including stage IV non-small cell lung cancer with metastasis to femur/liver diagnosed in December 2022.  Patient is status post systemic chemotherapy and most recently on maintenance single agent Libtayo. He is status post right hip XRT.  Patient was found to have impending pathologic fracture of the right femur and underwent surgical pinning at [facility] by Ortho oncology.   INTERVAL HISTORY: Patient last saw Dr. Donneta Romberg 02/14/2023 with plan for continued maintenance Libatayo.  CT of the chest abdomen pelvis January 04, 2023 showed significant interval improvement with almost complete resolution of right lobe hepatic metastasis and no evidence of lymphadenopathy or other metastatic disease in the chest abdomen or pelvis.  Patient presents to [facility] today for evaluation of fatigue.  Patient reports he just often feels like he does not have much energy.  He is still working daily.  Describes sleeping well at night but also often naps during the day.   Denies any neurologic complaints. Denies recent fevers or illnesses. Denies any easy bleeding or bruising. Reports fair appetite and denies weight loss. Denies chest pain. Denies any nausea, vomiting, constipation, or diarrhea. Denies urinary complaints. Patient offers no further  specific complaints today.   PAST MEDICAL HISTORY: Past Medical History:  Diagnosis Date   Anxiety    associated with medical care,  worsened by difficulty hearing, does better with wife present   Complication of anesthesia    wife states very anxious may need pre sedation   Coronary artery disease    GERD (gastroesophageal reflux disease)    Hearing loss    mild per wife   Hearing loss    no hearing aids per wife   Hyperlipidemia    Hypertension    MI (myocardial infarction) (HCC) 04/17/2006   Acute inferolateral wall MI with cardiogenic shock and complete heart block   Primary adenocarcinoma of upper lobe of right lung (HCC)    Prostate cancer (HCC)    Tobacco abuse    Wears glasses    Wears partial dentures    Upper    PAST SURGICAL HISTORY:  Past Surgical History:  Procedure Laterality Date   BRONCHIAL BIOPSY  10/09/2021   Procedure: BRONCHIAL BIOPSIES;  Surgeon: Leslye Peer, MD;  Location: [facility] ENDOSCOPY;  Service: Pulmonary;;   BRONCHIAL BRUSHINGS  10/09/2021   Procedure: BRONCHIAL BRUSHINGS;  Surgeon: Leslye Peer, MD;  Location: [facility] ENDOSCOPY;  Service: Pulmonary;;   BRONCHIAL NEEDLE ASPIRATION BIOPSY  10/09/2021   Procedure: BRONCHIAL NEEDLE ASPIRATION BIOPSIES;  Surgeon: Leslye Peer, MD;  Location: [facility] ENDOSCOPY;  Service: Pulmonary;;   CARDIAC CATHETERIZATION  2007   left, RCA 100% occluded ruptured plaque with thrombus in the proximal segment   CYST EXCISION N/A 08/30/2021   Procedure: EXCISION OF POSTERIOR SCALP CYST;  Surgeon: Berna Bue, MD;  Location: [facility] ORS;  Service: General;  Laterality: N/A;   CYSTOSCOPY N/A 07/17/2018   Procedure: CYSTOSCOPY;  Surgeon: Marcine Matar, MD;  Location: [facility];  Service: Urology;  Laterality: N/A;  no seeds in bladder per Dr Retta Diones   FIDUCIAL MARKER PLACEMENT  10/09/2021   Procedure: FIDUCIAL MARKER PLACEMENT;  Surgeon: Leslye Peer, MD;  Location: [facility] ENDOSCOPY;  Service:  Pulmonary;;   HAND SURGERY Right    has metal plate in arm   PROSTATE BIOPSY     RADIOACTIVE SEED IMPLANT N/A 07/17/2018   Procedure: RADIOACTIVE SEED IMPLANT/BRACHYTHERAPY IMPLANT;  Surgeon: Marcine Matar, MD;  Location: [facility];  Service: Urology;  Laterality: N/A;  77 seeds   SPACE OAR INSTILLATION N/A 07/17/2018   Procedure: SPACE OAR INSTILLATION;  Surgeon: Marcine Matar, MD;  Location: [facility];  Service: Urology;  Laterality: N/A;   VIDEO BRONCHOSCOPY WITH RADIAL ENDOBRONCHIAL ULTRASOUND  10/09/2021   Procedure: VIDEO BRONCHOSCOPY WITH RADIAL ENDOBRONCHIAL ULTRASOUND;  Surgeon: Leslye Peer, MD;  Location: [facility] ENDOSCOPY;  Service: Pulmonary;;   WRIST SURGERY  10/04/2012    HEMATOLOGY/ONCOLOGY HISTORY:  Oncology History Overview Note  DIAGNOSIS: 1) stage IV (T1c, N0, M1 C) non-small cell lung cancer favoring adenocarcinoma presented with right lung apical nodule in addition to metastatic disease in the right hepatic lobe and the proximal right femoral diaphysis diagnosed and December 2022. 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    QNS; Guardant 360 showed positive KRAS G12C mutation  FINAL MICROSCOPIC DIAGNOSIS:   A. LUNG, RUL, FINE NEEDLE ASPIRATION:  - Malignant cells consistent with non-small cell carcinoma, see comment   B. LUNG, RUL, BRUSHING:  - Malignant cells consistent with non-small cell carcinoma, see comment       COMMENT:   A and B.  Dr. Luisa Hart reviewed the case and concurs with the diagnosis.  Only rare malignant cells are present on the cellblock.  Immunohistochemical stains were attempted and show that the tumor cells  have patchy staining for TTF-1 whereas p63, p40 and CK5/6 are negative.  The findings are nondiagnostic but suggestive of a lung adenocarcinoma.  Dr. Delton Coombes was notified on 10/13/2021.   SEP-OCT  2022-  [Dermatology]-   Poorly differentiated squamous cell carcinoma; -  Carcinoma extends to the edges of the excision specimen   IMPRESSION: 1. The spiculated nodule at the right lung apex on recent neck CT is hypermetabolic and is concerning for primary bronchogenic carcinoma. Tissue sampling recommended. 2. Hypermetabolic activity within the occipital scalp and small previously demonstrated right occipital lymph node compatible with known squamous cell carcinoma. Evaluation of the head and neck limited by motion artifact. 3. Hypermetabolic lesions inferiorly in the right hepatic lobe and in the proximal right femoral diaphysis suspicious for metastatic disease, primary uncertain in this patient with a history of prostate cancer. Correlate with PSA levels. Abdominal MRI without and with contrast may be helpful for further characterization of the liver lesion.  # s/p RT tto RUL [[place]]; right hip RT;   # 2007MI-CAD [s/p stent-Dr.Berry; EF 2021- 58%]     Primary adenocarcinoma of upper lobe of right lung (HCC)  10/18/2021 Initial Diagnosis   Primary adenocarcinoma of upper lobe of right lung (HCC)   10/18/2021 Cancer Staging   Staging form: Lung, AJCC 8th Edition - Clinical: Stage IVB (cT1c, cN0, cM1c) - Signed by [name], MD on 10/18/2021   11/01/2021 - 05/30/2022 Chemotherapy   Patient is on Treatment Plan : LUNG NSCLC Pemetrexed + Carboplatin q21d x 4 Cycles  11/01/2021 -  Chemotherapy   Patient is on Treatment Plan : LUNG NSCLC Libtayo q  21d        ALLERGIES:  has No Known Allergies.  MEDICATIONS:  Current Outpatient Medications  Medication Sig Dispense Refill   atorvastatin (LIPITOR) 20 MG tablet Take 1 tablet (20 mg total) by mouth daily. 90 tablet 3   clopidogrel (PLAVIX) 75 MG tablet TAKE 1 TABLET BY MOUTH EVERY DAY 90 tablet 2   gabapentin (NEURONTIN) 100 MG capsule Take 1 capsule (100 mg total) by mouth 2 (two) times daily. 30 capsule 1   metoprolol  tartrate (LOPRESSOR) 25 MG tablet TAKE 1 TABLET (25 MG TOTAL) BY MOUTH DAILY. 90 tablet 2   pantoprazole (PROTONIX) 40 MG tablet Take 1 tablet (40 mg total) by mouth daily. 60 tablet 1   sertraline (ZOLOFT) 100 MG tablet TAKE 1 TABLET BY MOUTH EVERY DAY 90 tablet 0   tamsulosin (FLOMAX) 0.4 MG CAPS capsule Take 1 capsule (0.4 mg total) by mouth daily. 30 capsule 3   traMADol (ULTRAM) 50 MG tablet Take 1-2 tablets (50-100 mg total) by mouth every 6 (six) hours as needed for moderate pain or severe pain. 60 tablet 0   Vitamin D, Ergocalciferol, (DRISDOL) 1.25 MG (50000 UNIT) CAPS capsule TAKE 1 CAPSULE BY MOUTH ONE TIME PER WEEK 12 capsule 1   ALPRAZolam (XANAX) 0.25 MG tablet Take 1 tablet (0.25 mg total) by mouth at bedtime. (Patient not taking: Reported on 02/27/2023) 15 tablet 0   cyclobenzaprine (FLEXERIL) 10 MG tablet Take 1 tablet by mouth 3 (three) times daily as needed. (Patient not taking: Reported on 02/27/2023)     ketoconazole (NIZORAL) 2 % cream Apply 1 application. topically 2 (two) times daily as needed for irritation. (Patient not taking: Reported on 02/27/2023)     No current facility-administered medications for this visit.    VITAL SIGNS: BP 114/79   Pulse 80   Temp 98.5 F (36.9 C) (Tympanic)   Resp 20   Ht 5\' 11"  (1.803 m)   Wt 243 lb (110.2 kg)   SpO2 98% Comment: room air  BMI 33.89 kg/m  Filed Weights   03/07/23 1311  Weight: 243 lb (110.2 kg)    Estimated body mass index is 33.89 kg/m as calculated from the following:   Height as of this encounter: 5\' 11"  (1.803 m).   Weight as of this encounter: 243 lb (110.2 kg).  LABS: CBC:    Component Value Date/Time   WBC 5.2 03/07/2023 1253   HGB 15.5 03/07/2023 1253   HGB 16.4 10/31/2021 1107   HCT 45.9 03/07/2023 1253   PLT 217 03/07/2023 1253   PLT 217 10/31/2021 1107   MCV 87.6 03/07/2023 1253   NEUTROABS 3.4 03/07/2023 1253   LYMPHSABS 1.0 03/07/2023 1253   MONOABS 0.6 03/07/2023 1253   EOSABS 0.1  03/07/2023 1253   BASOSABS 0.0 03/07/2023 1253   Comprehensive Metabolic Panel:    Component Value Date/Time   NA 137 03/07/2023 1254   K 3.5 03/07/2023 1254   CL 105 03/07/2023 1254   CO2 22 03/07/2023 1254   BUN 17 03/07/2023 1254   CREATININE 0.99 03/07/2023 1254   GLUCOSE 119 (H) 03/07/2023 1254   CALCIUM 9.4 03/07/2023 1254   AST 20 03/07/2023 1254   ALT 19 03/07/2023 1254   ALKPHOS 66 03/07/2023 1254   BILITOT 0.7 03/07/2023 1254   PROT 7.9 03/07/2023 1254   ALBUMIN 4.2 03/07/2023 1254    RADIOGRAPHIC STUDIES:  No results found.  PERFORMANCE STATUS (ECOG) : 2 - Symptomatic, <50% confined to bed  Review of Systems Unless otherwise noted, a complete review of systems is negative.  Physical Exam General: NAD Cardiovascular: regular rate and rhythm Pulmonary: clear ant fields Abdomen: soft, nontender, + bowel sounds GU: no suprapubic tenderness Extremities: no edema, no joint deformities Skin: no rashes Neurological: Weakness but otherwise nonfocal  IMPRESSION/PLAN: Stage IV non-small cell lung cancer - on treatment with Libatayo   Fatigue - Likely multifactorial secondary to cancer vs treatment.  Patient also has history of OSA and does not wear CPAP.  He was also recently started on gabapentin but unclear if this is exacerbating fatigue.  Discussed that he could hold gabapentin.  Libatayo is also associated with fatigue in up to 50% of patients. Discussed energy conservation. Continue PT. Supportive care and will proceed with IVFs today.   Neoplasm related pain -patient pending MRI lumbar and sacral spine on 5/9.  Continue tramadol and discussed maximizing dosing with 1-2 tablets QID. Can consider rotating to Norco if needed.    Patient expressed understanding and was in agreement with this plan. He also understands that He can call clinic at any time with any questions, concerns, or complaints.   Thank you for allowing me to participate in the care of this very  pleasant patient.   Time Total: 20 minutes  Visit consisted of counseling and education dealing with the complex and emotionally intense issues of symptom management in the setting of serious illness.Greater than 50%  of this time was spent counseling and coordinating care related to the above assessment and plan.  Signed by: Laurette Schimke, PhD, NP-C

## 2023-03-08 LAB — THYROID PANEL WITH TSH
Free Thyroxine Index: 1.7 (ref 1.2–4.9)
T3 Uptake Ratio: 25 % (ref 24–39)
T4, Total: 6.6 ug/dL (ref 4.5–12.0)
TSH: 1.53 u[IU]/mL (ref 0.450–4.500)

## 2023-03-08 LAB — TESTOSTERONE: Testosterone: 344 ng/dL (ref 264–916)

## 2023-03-11 ENCOUNTER — Other Ambulatory Visit: Payer: Self-pay | Admitting: Hospice and Palliative Medicine

## 2023-03-11 ENCOUNTER — Telehealth: Payer: Self-pay | Admitting: *Deleted

## 2023-03-11 ENCOUNTER — Encounter: Payer: Self-pay | Admitting: Hospice and Palliative Medicine

## 2023-03-11 ENCOUNTER — Other Ambulatory Visit: Payer: Self-pay | Admitting: *Deleted

## 2023-03-11 MED ORDER — HYDROCODONE-ACETAMINOPHEN 5-325 MG PO TABS: 1.0000 | ORAL_TABLET | ORAL | 0 refills | Status: DC | PRN

## 2023-03-11 NOTE — Telephone Encounter (Signed)
Per pt's request, I called [facility] [place] to cnl the norco script. Msg sent to josh borders to send new pain script to [facility].

## 2023-03-11 NOTE — Telephone Encounter (Signed)
Per wife- norco script needs be sent to [facility] [place]

## 2023-03-12 ENCOUNTER — Telehealth: Payer: Self-pay | Admitting: *Deleted

## 2023-03-12 ENCOUNTER — Telehealth: Payer: Self-pay | Admitting: Hospice and Palliative Medicine

## 2023-03-12 ENCOUNTER — Other Ambulatory Visit: Payer: Self-pay | Admitting: Internal Medicine

## 2023-03-12 DIAGNOSIS — C3411 Malignant neoplasm of upper lobe, right bronchus or lung: Secondary | ICD-10-CM

## 2023-03-12 DIAGNOSIS — M25551 Pain in right hip: Secondary | ICD-10-CM

## 2023-03-12 NOTE — Progress Notes (Signed)
Please schedule for next week-MD labs CBC CMP; chemo- thanks GB

## 2023-03-12 NOTE — Telephone Encounter (Signed)
Per wife's request. I personally reached out to pt to discuss his scan results and provider's recommendations. Pt declines orthopedic referral at this time. I sent pt's wife a mychart msg back to let her know pt declined orthopedic referral.

## 2023-03-12 NOTE — Telephone Encounter (Signed)
I called and reviewed results of MRI with patient's wife.  MRI suggests degenerative changes in the lumbar spine in addition to right gluteal strain.  Recommended that patient continue conservative measures including working with PT, rest, ice/heat in addition to taking as needed Norco as previously prescribed.  Additionally, will send referral to Ortho for consideration of interventional procedures.

## 2023-03-12 NOTE — Telephone Encounter (Signed)
Per josh- MRI results are back. I spoke with wife. Would you please coordinate a referral to [facility] or [facility]

## 2023-03-13 ENCOUNTER — Encounter: Payer: Self-pay | Admitting: Hospice and Palliative Medicine

## 2023-03-14 ENCOUNTER — Encounter: Payer: Self-pay | Admitting: Hospice and Palliative Medicine

## 2023-03-14 DIAGNOSIS — R260 Ataxic gait: Secondary | ICD-10-CM | POA: Diagnosis not present

## 2023-03-14 DIAGNOSIS — M25551 Pain in right hip: Secondary | ICD-10-CM | POA: Diagnosis not present

## 2023-03-14 DIAGNOSIS — M25651 Stiffness of right hip, not elsewhere classified: Secondary | ICD-10-CM | POA: Diagnosis not present

## 2023-03-14 DIAGNOSIS — M6281 Muscle weakness (generalized): Secondary | ICD-10-CM | POA: Diagnosis not present

## 2023-03-18 ENCOUNTER — Other Ambulatory Visit: Payer: Self-pay | Admitting: *Deleted

## 2023-03-18 DIAGNOSIS — M25551 Pain in right hip: Secondary | ICD-10-CM

## 2023-03-18 DIAGNOSIS — R296 Repeated falls: Secondary | ICD-10-CM

## 2023-03-18 DIAGNOSIS — G893 Neoplasm related pain (acute) (chronic): Secondary | ICD-10-CM

## 2023-03-18 DIAGNOSIS — R531 Weakness: Secondary | ICD-10-CM

## 2023-03-18 DIAGNOSIS — C3411 Malignant neoplasm of upper lobe, right bronchus or lung: Secondary | ICD-10-CM

## 2023-03-19 ENCOUNTER — Encounter: Payer: Self-pay | Admitting: Hospice and Palliative Medicine

## 2023-03-19 NOTE — Progress Notes (Signed)
Initial neurology clinic note  Reason for Evaluation: Consultation requested by Borders, Daryl Eastern, NP for an opinion regarding pain and weakness of right leg. My final recommendations will be communicated back to the requesting physician by way of shared medical record or letter to requesting physician via [place] mail.  HPI: This is Mr. Chris [name], a 62 y.o. male with a medical history of stage IV non-small cell lung cancer with mets to liver and right femur c/b right femur fracture, HTN, HLD, CAD c/b MI, prostate cancer, GERD, hearing loss, tobacco use who presents to neurology clinic with the chief complaint of pain and weakness of right leg. The patient is accompanied by wife.  Patient has had pain and difficulty walking since 08/2022. Patient had femur surgery due to subtrochanteric cortical longitudinal fracture (likely secondary to metastatic lesion). He mentions he has had no relief of his pain even before the surgery. He has had difficulty doing PT except when in water. He is in such discomfort, that PT will not work with him.  Patient was working until the last 1.5 weeks. He is currently walking with 2 canes when out in public and a walker at home. Wife has noticed his mobility has worsened over the last 1.5 weeks. He cannot pick up his leg now. Per wife, he was able to move around a few months after surgery, but has become much worse and a fall risk over the last couple of weeks.  Patient had radiation therapy to the right femur in 06/2022.  Patient has had MRI of lumbar spine and sacrum that showed degenerative changes in the lumbar spine. He also has a right gluteal strain. Referral sent to ortho and PT. Patient declined ortho referral.  Why can't he walk is the wife's question?  Patient is on tramadol and opioids (Norco) for pain.  Onocologic history per Dr. Sharlette Dense clinic note from 02/14/23 (per note, patient was ambulating by cane that day):  DIAGNOSIS: 1) stage IV  (T1c, N0, M1 C) non-small cell lung cancer favoring adenocarcinoma presented with right lung apical nodule in addition to metastatic disease in the right hepatic lobe and the proximal right femoral diaphysis diagnosed and December 2022. 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      QNS; Guardant 360 showed positive KRAS G12C mutation   FINAL MICROSCOPIC DIAGNOSIS:   A. LUNG, RUL, FINE NEEDLE ASPIRATION:  - Malignant cells consistent with non-small cell carcinoma, see comment   B. LUNG, RUL, BRUSHING:  - Malignant cells consistent with non-small cell carcinoma, see comment       COMMENT:   A and B.  Dr. Luisa Hart reviewed the case and concurs with the diagnosis.  Only rare malignant cells are present on the cellblock.  Immunohistochemical stains were attempted and show that the tumor cells  have patchy staining for TTF-1 whereas p63, p40 and CK5/6 are negative.  The findings are nondiagnostic but suggestive of a lung adenocarcinoma.  Dr. Delton Coombes was notified on 10/13/2021.    SEP-OCT 2022-  [Dermatology]-   Poorly differentiated squamous cell carcinoma; -  Carcinoma extends to the edges of the excision specimen    IMPRESSION: 1. The spiculated nodule at the right lung apex on recent neck CT is hypermetabolic and is concerning for primary bronchogenic carcinoma. Tissue sampling recommended. 2. Hypermetabolic activity within the occipital scalp and small previously demonstrated right occipital lymph node compatible with known squamous cell carcinoma.  Evaluation of the head and neck limited by motion artifact. 3. Hypermetabolic lesions inferiorly in the right hepatic lobe and in the proximal right femoral diaphysis suspicious for metastatic disease, primary uncertain in this patient with a history of prostate cancer. Correlate with PSA levels. Abdominal MRI without and with contrast may be  helpful for further characterization of the liver lesion.   # s/p RT tto RUL [[place]]; right hip RT;    # 2007MI-CAD [s/p stent-Dr.Berry; EF 2021- 58%]      Primary adenocarcinoma of upper lobe of right lung   10/18/2021 Initial Diagnosis     Primary adenocarcinoma of upper lobe of right lung (HCC)     10/18/2021 Cancer Staging     Staging form: Lung, AJCC 8th Edition - Clinical: Stage IVB (cT1c, cN0, cM1c) - Signed by [name], MD on 10/18/2021     11/01/2021 - 05/30/2022 Chemotherapy     Patient is on Treatment Plan : LUNG NSCLC Pemetrexed + Carboplatin q21d x 4 Cycles      11/01/2021 -  Chemotherapy     Patient is on Treatment Plan : LUNG NSCLC Libtayo q  21d       Patient has lost about 10 pounds over the last 2-3 weeks. Per wife, this is due to him not moving around and not eating as much.   EtOH use: None now, years ago would drink  MEDICATIONS:  Outpatient Encounter Medications as of 03/20/2023  Medication Sig   atorvastatin (LIPITOR) 20 MG tablet Take 1 tablet (20 mg total) by mouth daily.   clopidogrel (PLAVIX) 75 MG tablet TAKE 1 TABLET BY MOUTH EVERY DAY   HYDROcodone-acetaminophen (NORCO) 5-325 MG tablet Take 1 tablet by mouth every 4 (four) hours as needed for moderate pain.   ketoconazole (NIZORAL) 2 % cream Apply 1 application  topically 2 (two) times daily as needed for irritation.   metoprolol tartrate (LOPRESSOR) 25 MG tablet TAKE 1 TABLET (25 MG TOTAL) BY MOUTH DAILY.   pantoprazole (PROTONIX) 40 MG tablet Take 1 tablet (40 mg total) by mouth daily.   sertraline (ZOLOFT) 100 MG tablet TAKE 1 TABLET BY MOUTH EVERY DAY (Patient taking differently: Take 50 mg by mouth daily.)   tamsulosin (FLOMAX) 0.4 MG CAPS capsule Take 1 capsule (0.4 mg total) by mouth daily.   Vitamin D, Ergocalciferol, (DRISDOL) 1.25 MG (50000 UNIT) CAPS capsule TAKE 1 CAPSULE BY MOUTH ONE TIME PER WEEK   ALPRAZolam (XANAX) 0.25 MG tablet Take 1 tablet (0.25 mg total) by mouth at bedtime.  (Patient not taking: Reported on 03/20/2023)   cyclobenzaprine (FLEXERIL) 10 MG tablet Take 1 tablet by mouth 3 (three) times daily as needed. (Patient not taking: Reported on 03/20/2023)   [DISCONTINUED] gabapentin (NEURONTIN) 100 MG capsule Take 1 capsule (100 mg total) by mouth 2 (two) times daily.   No facility-administered encounter medications on file as of 03/20/2023.    PAST MEDICAL HISTORY: Past Medical History:  Diagnosis Date   Anxiety    associated with medical care,  worsened by difficulty hearing, does better with wife present   Complication of anesthesia    wife states very anxious may need pre sedation   Coronary artery disease    GERD (gastroesophageal reflux disease)    Hearing loss    mild per wife   Hearing loss    no hearing aids per wife   Hyperlipidemia    Hypertension    MI (myocardial infarction) (HCC) 04/17/2006   Acute inferolateral wall MI  with cardiogenic shock and complete heart block   Primary adenocarcinoma of upper lobe of right lung (HCC)    Prostate cancer (HCC)    Tobacco abuse    Wears glasses    Wears partial dentures    Upper    PAST SURGICAL HISTORY: Past Surgical History:  Procedure Laterality Date   BRONCHIAL BIOPSY  10/09/2021   Procedure: BRONCHIAL BIOPSIES;  Surgeon: Leslye Peer, MD;  Location[facility] ENDOSCOPY;  Service: Pulmonary;;   BRONCHIAL BRUSHINGS  10/09/2021   Procedure: BRONCHIAL BRUSHINGS;  Surgeon: Leslye Peer, MD;  Location: [facility] ENDOSCOPY;  Service: Pulmonary;;   BRONCHIAL NEEDLE ASPIRATION BIOPSY  10/09/2021   Procedure: BRONCHIAL NEEDLE ASPIRATION BIOPSIES;  Surgeon: Leslye Peer, MD;  Location: [facility] ENDOSCOPY;  Service: Pulmonary;;   CARDIAC CATHETERIZATION  2007   left, RCA 100% occluded ruptured plaque with thrombus in the proximal segment   CYST EXCISION N/A 08/30/2021   Procedure: EXCISION OF POSTERIOR SCALP CYST;  Surgeon: Berna Bue, MD;  Location: [facility] ORS;  Service: General;  Laterality: N/A;    CYSTOSCOPY N/A 07/17/2018   Procedure: Bluford Kaufmann;  Surgeon: Marcine Matar, MD;  Location[facility];  Service: Urology;  Laterality: N/A;  no seeds in bladder per Dr Retta Diones   FIDUCIAL MARKER PLACEMENT  10/09/2021   Procedure: FIDUCIAL MARKER PLACEMENT;  Surgeon: Leslye Peer, MD;  Location: [facility] ENDOSCOPY;  Service: Pulmonary;;   HAND SURGERY Right    has metal plate in arm   PROSTATE BIOPSY     RADIOACTIVE SEED IMPLANT N/A 07/17/2018   Procedure: RADIOACTIVE SEED IMPLANT/BRACHYTHERAPY IMPLANT;  Surgeon: Marcine Matar, MD;  Location: [facility];  Service: Urology;  Laterality: N/A;  77 seeds   SPACE OAR INSTILLATION N/A 07/17/2018   Procedure: SPACE OAR INSTILLATION;  Surgeon: Marcine Matar, MD;  Location: [facility];  Service: Urology;  Laterality: N/A;   VIDEO BRONCHOSCOPY WITH RADIAL ENDOBRONCHIAL ULTRASOUND  10/09/2021   Procedure: VIDEO BRONCHOSCOPY WITH RADIAL ENDOBRONCHIAL ULTRASOUND;  Surgeon: Leslye Peer, MD;  Location: [facility] ENDOSCOPY;  Service: Pulmonary;;   WRIST SURGERY  10/04/2012    ALLERGIES: No Known Allergies  FAMILY HISTORY: Family History  Problem Relation Age of Onset   Breast cancer Mother    Prostate cancer Neg Hx    Kidney cancer Neg Hx    Cancer Neg Hx     SOCIAL HISTORY: Social History   Tobacco Use   Smoking status: Every Day    Packs/day: 1.50    Years: 42.00    Additional pack years: 0.00    Total pack years: 63.00    Types: Cigarettes   Smokeless tobacco: Never  Vaping Use   Vaping Use: Never used  Substance Use Topics   Alcohol use: No   Drug use: No   Social History   Social History Narrative   10-04 19 Unable to ask abuse questions wife with him today.   Are you right handed or left handed? Ambidextrous prominent right   Are you currently employed ? 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 no   Who lives with you?  Wife and patient   What type of home do you live in: 1 story or 2 story? 1 story         OBJECTIVE: PHYSICAL EXAM: BP 125/72   Pulse 80   Resp 16   Ht 5\' 11"  (1.803 m)  Wt 230 lb (104.3 kg)   SpO2 98%   BMI 32.08 kg/m   General: General appearance: Awake and alert. No distress. Cooperative with exam.  Skin: No obvious rash or jaundice. HEENT: Atraumatic. Scleral icterus. Lungs: Non-labored breathing on room air  Extremities: No edema. No obvious deformity.  Psych: Affect appropriate.  Neurological: Mental Status: Alert. Speech fluent. No pseudobulbar affect Cranial Nerves: CNII: No RAPD. Visual fields grossly intact. CNIII, IV, VI: PERRL. No nystagmus. EOMI. CN V: Facial sensation intact bilaterally to fine touch. CN VII: Facial muscles symmetric and strong. No ptosis at rest. CN VIII: Hearing grossly intact bilaterally. CN IX: No hypophonia. CN X: Palate elevates symmetrically. CN XI: Full strength shoulder shrug bilaterally. CN XII: Tongue protrusion full and midline. No atrophy or fasciculations. No significant dysarthria Motor: Tone is normal. No atrophy.  Individual muscle group testing (MRC grade out of 5):  Movement     Neck flexion 5    Neck extension 5     Right Left   Shoulder abduction 5 5   Elbow flexion 5 5   Elbow extension 5 5   Finger extension 5 5   Finger distal flexion - 2/3 5 5    Finger distal flexion - 4/5 5 5    Thumb flexion - FPL 5 5   Thumb abduction - APB 5 5    Hip flexion Limited by pain 5   Hip extension Limited by pain 5   Hip adduction Limited by pain 5   Hip abduction Limited by pain 5   Knee extension Limited by pain 5   Knee flexion Limited by pain 5   Dorsiflexion 5 5   Plantarflexion 5 5   Inversion 5 5   Eversion 5 5     Reflexes:  Right Left   Bicep 2+ 2+   Tricep 2+ 2+   BrRad 2+ 2+   Knee 2+ 2+   Ankle 2+ 2+    Pathological Reflexes: Babinski: flexor response bilaterally Hoffman: absent  bilaterally Troemner: absent bilaterally Sensation: Pinprick: Intact in all extremities Coordination: Intact finger-to- nose-finger bilaterally. Gait: Unable to raise from chair unassisted. Walks with two canes, not able to put weight on right leg.  Lab and Test Review: Internal labs: 03/07/23: CMP significant for elevated glucose (119) TSH wnl CBC wnl  Vit D (12/19/22): wnl  Imaging: MRI sacrum (03/07/23): FINDINGS: Bones/Joint/Cartilage   Sacrum and coccyx intact without fracture. Bilateral sacroiliac joints are intact without diastasis. Minimal joint space narrowing and spurring of the SI joints is more pronounced on the left. No erosion. No ankylosis. No bone marrow edema. No marrow replacing bone lesion. Partially imaged ORIF hardware in the right femoral head.   Ligaments   Intact.   Muscles and Tendons   Mild edema within the imaged right gluteal musculature. Otherwise unremarkable appearance of the imaged musculotendinous structures.   Soft tissues   No presacral edema or fluid. No presacral mass. No sacral decubitus ulcer. Partially seen colonic diverticulosis.   IMPRESSION: 1. No acute osseous abnormality of the sacrum or coccyx. No evidence of osseous metastatic disease. 2. Minimal osteoarthritic changes of the SI joints. 3. Mild edema within the imaged right gluteal musculature, possibly representing a mild muscle strain. 4. Colonic diverticulosis.  MRI lumbar spine w/wo contrast (03/07/23): FINDINGS: Segmentation:  Standard.   Alignment:  Physiologic.   Vertebrae: No evidence of acute fracture, discitis, or aggressive osseous lesion.   Conus medullaris and cauda equina: Conus extends to the L1  level. Conus and cauda equina appear normal.   Paraspinal and other soft tissues: Negative.   Disc levels:   T11-T12: No significant spinal canal or neural foraminal narrowing.   T12-L1: No significant spinal canal or neural foraminal narrowing.    L1-L2: Minimal disc bulging. No significant spinal canal or neural foraminal stenosis.   L2-L3: Mild disc bulging. No significant spinal canal or neural foraminal stenosis.   L3-L4: Minimal disc bulging. No significant spinal canal or neural foraminal stenosis.   L4-L5: Mild disc bulging. Mild bilateral facet arthropathy with trace effusions. No significant spinal canal or neural foraminal stenosis.   L5-S1: Broad disc bulging with endplate spurring, ligament flavum hypertrophy and moderate bilateral facet arthropathy. Moderate right and mild left subarticular stenosis. Moderate bilateral neural foraminal stenosis.   Small perineural cyst at S2.   IMPRESSION: Multilevel degenerative changes of the lumbar spine, worst at L5-S1, with mild-to-moderate subarticular stenosis and moderate bilateral neural foraminal stenosis at this level.   Mild degenerative changes from L1 through L5, without significant stenosis at these levels.  CT right femur (09/12/22): FINDINGS: The right hip is normally located. No stress fracture or AVN. Subchondral cyst noted at the head neck junction region.   Subtrochanteric cortical longitudinal fracture involving the medial cortex of the femur possibly a stress fracture related to patient's underlying metastatic lesion and radiation treatment. No through and through fracture is identified.   The remainder of the femur is unremarkable. No bone lesions or other fractures.   The hip and pelvic musculature is grossly normal. No obvious muscle lesions, muscle tear or intramuscular hematoma. No subcutaneous lesions.   No significant intrapelvic abnormalities are identified. Brachytherapy seeds are noted in the prostate gland. No right-sided inguinal or pelvic adenopathy. Stable scattered vascular disease.   IMPRESSION: Subtrochanteric cortical longitudinal fracture involving the medial cortex of the femur possibly a stress fracture related to  patient's underlying metastatic lesion and radiation treatment.  ASSESSMENT: Chris [name] is a 62 y.o. male who presents for evaluation of right leg pain and weakness. He has a relevant medical history of stage IV non-small cell lung cancer with mets to liver and right femur c/b right femur fracture, HTN, HLD, CAD c/b MI, prostate cancer, GERD, hearing loss, tobacco use. His neurological examination is pertinent for extreme pain in right leg. Strength is difficult to assess due to the pain. Reflexes and sensation appear normal in the right leg though. Available diagnostic data is significant for CT of right femur in 08/2022 showing right femur fracture. MRI lumbar spine showed degenerative disc disease with up to moderate neural foraminal stenosis, but not at levels that would be associated with right proximal muscle weakness. MRI of sacrum showed possible strain of the right gluteal muscles. The etiology of patient's symptoms is likely multifactorial. Clearly a femur fracture and mets in this area or muscle strain could be causing pain and poor mobility of right leg due to that pain. Nerve damage (radiculopathy vs lumbosacral plexopathy vs femoral neuropathy) is also possible given radiation to that area, fracture and surgery. I will assess with EMG to look for neurologic causes of pain and weakness.  PLAN: -EMG of RLE (04/01/23 at 10 am) -Pain management per oncology -Encouraged orthopedic consultation -Patient will benefit from PT when pain allows  -Return to clinic to be determined  The impression above as well as the plan as outlined below were extensively discussed with the patient (in the company of wife) who voiced understanding. All questions were  answered to their satisfaction.  The patient was counseled on pertinent fall precautions per the printed material provided today, and as noted under the "Patient Instructions" section below.  When available, results of the above investigations  and possible further recommendations will be communicated to the patient via telephone/MyChart. Patient to call office if not contacted after expected testing turnaround time.   Total time spent reviewing records, interview, history/exam, documentation, and coordination of care on day of encounter:  60 min   Thank you for allowing me to participate in patient's care.  If I can answer any additional questions, I would be pleased to do so.  Jacquelyne Balint, MD   CC: Sherrie Mustache, MD [address] [place] [address]  CC: Referring provider: Borders, Daryl Eastern, NP [address] [place],  [place] [address]

## 2023-03-20 ENCOUNTER — Telehealth: Payer: Self-pay | Admitting: Internal Medicine

## 2023-03-20 ENCOUNTER — Encounter: Payer: Self-pay | Admitting: Neurology

## 2023-03-20 ENCOUNTER — Ambulatory Visit (INDEPENDENT_AMBULATORY_CARE_PROVIDER_SITE_OTHER): Payer: BC Managed Care – PPO | Admitting: Neurology

## 2023-03-20 VITALS — BP 125/72 | HR 80 | Resp 16 | Ht 71.0 in | Wt 230.0 lb

## 2023-03-20 DIAGNOSIS — M79604 Pain in right leg: Secondary | ICD-10-CM | POA: Diagnosis not present

## 2023-03-20 DIAGNOSIS — R29898 Other symptoms and signs involving the musculoskeletal system: Secondary | ICD-10-CM | POA: Diagnosis not present

## 2023-03-20 DIAGNOSIS — S7224XS Nondisplaced subtrochanteric fracture of right femur, sequela: Secondary | ICD-10-CM

## 2023-03-20 DIAGNOSIS — C3411 Malignant neoplasm of upper lobe, right bronchus or lung: Secondary | ICD-10-CM

## 2023-03-20 DIAGNOSIS — T148XXA Other injury of unspecified body region, initial encounter: Secondary | ICD-10-CM

## 2023-03-20 NOTE — Telephone Encounter (Signed)
Patient s/p recent fall- awaiting ortho evaluation tomorrow.  S/p Dr.Hill- [facility]-neurology evaluation- today 5/21. Awaiting nerve conduction studies.   Ok to [employer] off appt tomorrow.  Wife will update  [place] re: follow up /re-scheduling appts.

## 2023-03-20 NOTE — Patient Instructions (Addendum)
ELECTROMYOGRAM AND NERVE CONDUCTION STUDIES (EMG/NCS) INSTRUCTIONS  How to Prepare The neurologist conducting the EMG will need to know if you have certain medical conditions. Tell the neurologist and other EMG lab personnel if you: Have a pacemaker or any other electrical medical device Take blood-thinning medications Have hemophilia, a blood-clotting disorder that causes prolonged bleeding Bathing Take a shower or bath shortly before your exam in order to remove oils from your skin. Don't apply lotions or creams before the exam.  What to Expect You'll likely be asked to change into a hospital gown for the procedure and lie down on an examination table. The following explanations can help you understand what will happen during the exam.  Electrodes. The neurologist or a technician places surface electrodes at various locations on your skin depending on where you're experiencing symptoms. Or the neurologist may insert needle electrodes at different sites depending on your symptoms.  Sensations. The electrodes will at times transmit a tiny electrical current that you may feel as a twinge or spasm. The needle electrode may cause discomfort or pain that usually ends shortly after the needle is removed. If you are concerned about discomfort or pain, you may want to talk to the neurologist about taking a short break during the exam.  Instructions. During the needle EMG, the neurologist will assess whether there is any spontaneous electrical activity when the muscle is at rest - activity that isn't present in healthy muscle tissue - and the degree of activity when you slightly contract the muscle.  He or she will give you instructions on resting and contracting a muscle at appropriate times. Depending on what muscles and nerves the neurologist is examining, he or she may ask you to change positions during the exam.  After your EMG You may experience some temporary, minor bruising where the needle  electrode was inserted into your muscle. This bruising should fade within several days. If it persists, contact your primary care doctor.   Preventing Falls at [facility] are common, often dreaded events in the lives of older people. Aside from the obvious injuries and even death that may result, fall can cause wide-ranging consequences including loss of independence, mental decline, decreased activity and mobility. Younger people are also at risk of falling, especially those with chronic illnesses and fatigue.  Ways to reduce risk for falling Examine diet and medications. Warm foods and alcohol dilate blood vessels, which can lead to dizziness when standing. Sleep aids, antidepressants and pain medications can also increase the likelihood of a fall.  Get a vision exam. Poor vision, cataracts and glaucoma increase the chances of falling.  Check foot gear. Shoes should fit snugly and have a sturdy, nonskid sole and a broad, low heel  Participate in a physician-approved exercise program to build and maintain muscle strength and improve balance and coordination. Programs that use ankle weights or stretch bands are excellent for muscle-strengthening. Water aerobics programs and low-impact Tai Chi programs have also been shown to improve balance and coordination.  Increase vitamin D intake. Vitamin D improves muscle strength and increases the amount of calcium the body is able to absorb and deposit in bones.  How to prevent falls from common hazards Floors - Remove all loose wires, cords, and throw rugs. Minimize clutter. Make sure rugs are anchored and smooth. Keep furniture in its usual place.  Chairs -- Use chairs with straight backs, armrests and firm seats. Add firm cushions to existing pieces to add height.  Bathroom -  Install grab bars and non-skid tape in the tub or shower. Use a bathtub transfer bench or a shower chair with a back support Use an elevated toilet seat and/or safety rails to  assist standing from a low surface. Do not use towel racks or bathroom tissue holders to help you stand.  Lighting - Make sure halls, stairways, and entrances are well-lit. Install a night light in your bathroom or hallway. Make sure there is a light switch at the top and bottom of the staircase. Turn lights on if you get up in the middle of the night. Make sure lamps or light switches are within reach of the bed if you have to get up during the night.  Kitchen - Install non-skid rubber mats near the sink and stove. Clean spills immediately. Store frequently used utensils, pots, pans between waist and eye level. This helps prevent reaching and bending. Sit when getting things out of lower cupboards.  Living room/ Bedrooms - Place furniture with wide spaces in between, giving enough room to move around. Establish a route through the living room that gives you something to hold onto as you walk.  Stairs - Make sure treads, rails, and rugs are secure. Install a rail on both sides of the stairs. If stairs are a threat, it might be helpful to arrange most of your activities on the lower level to reduce the number of times you must climb the stairs.  Entrances and doorways - Install metal handles on the walls adjacent to the doorknobs of all doors to make it more secure as you travel through the doorway.  Tips for maintaining balance Keep at least one hand free at all times. Try using a backpack or fanny pack to hold things rather than carrying them in your hands. Never carry objects in both hands when walking as this interferes with keeping your balance.  Attempt to swing both arms from front to back while walking. This might require a conscious effort if Parkinson's disease has diminished your movement. It will, however, help you to maintain balance and posture, and reduce fatigue.  Consciously lift your feet off of the ground when walking. Shuffling and dragging of the feet is a common culprit in  losing your balance.  When trying to navigate turns, use a "U" technique of facing forward and making a wide turn, rather than pivoting sharply.  Try to stand with your feet shoulder-length apart. When your feet are close together for any length of time, you increase your risk of losing your balance and falling.  Do one thing at a time. Don't try to walk and accomplish another task, such as reading or looking around. The decrease in your automatic reflexes complicates motor function, so the less distraction, the better.  Do not wear rubber or gripping soled shoes, they might "catch" on the floor and cause tripping.  Move slowly when changing positions. Use deliberate, concentrated movements and, if needed, use a grab bar or walking aid. Count 15 seconds between each movement. For example, when rising from a seated position, wait 15 seconds after standing to begin walking.  If balance is a continuous problem, you might want to consider a walking aid such as a cane, walking stick, or walker. Once you've mastered walking with help, you might be ready to try it on your own again.

## 2023-03-21 ENCOUNTER — Other Ambulatory Visit: Payer: Self-pay | Admitting: Hospice and Palliative Medicine

## 2023-03-21 ENCOUNTER — Ambulatory Visit (INDEPENDENT_AMBULATORY_CARE_PROVIDER_SITE_OTHER): Payer: BC Managed Care – PPO

## 2023-03-21 ENCOUNTER — Ambulatory Visit (INDEPENDENT_AMBULATORY_CARE_PROVIDER_SITE_OTHER): Payer: BC Managed Care – PPO | Admitting: Student

## 2023-03-21 ENCOUNTER — Other Ambulatory Visit (HOSPITAL_BASED_OUTPATIENT_CLINIC_OR_DEPARTMENT_OTHER): Payer: Self-pay | Admitting: Student

## 2023-03-21 ENCOUNTER — Other Ambulatory Visit: Payer: Self-pay | Admitting: Internal Medicine

## 2023-03-21 ENCOUNTER — Inpatient Hospital Stay: Payer: BC Managed Care – PPO

## 2023-03-21 ENCOUNTER — Inpatient Hospital Stay
Admission: RE | Admit: 2023-03-21 | Discharge: 2023-03-21 | Disposition: A | Discharge status: Home / Self Care | Payer: Self-pay | Source: Ambulatory Visit | Attending: Student | Admitting: Student

## 2023-03-21 ENCOUNTER — Inpatient Hospital Stay: Payer: BC Managed Care – PPO | Admitting: Internal Medicine

## 2023-03-21 DIAGNOSIS — M79604 Pain in right leg: Secondary | ICD-10-CM

## 2023-03-21 DIAGNOSIS — S7221XK Displaced subtrochanteric fracture of right femur, subsequent encounter for closed fracture with nonunion: Secondary | ICD-10-CM | POA: Diagnosis not present

## 2023-03-21 DIAGNOSIS — S72001A Fracture of unspecified part of neck of right femur, initial encounter for closed fracture: Secondary | ICD-10-CM | POA: Diagnosis not present

## 2023-03-21 DIAGNOSIS — S7291XD Unspecified fracture of right femur, subsequent encounter for closed fracture with routine healing: Secondary | ICD-10-CM | POA: Diagnosis not present

## 2023-03-21 MED ORDER — OXYCODONE HCL 10 MG PO TABS: 10.0000 mg | ORAL_TABLET | ORAL | 0 refills | Status: DC | PRN

## 2023-03-21 NOTE — Progress Notes (Signed)
I spoke with patient's wife. He was seen today by ortho and found to have femur fracture. Patient is being referred to trauma surgeon for repair. Norco is not adequately controlling the pain. Will rotate to oxycodone. New Rx sent to pharmacy.   Discussed with Dr. Donneta Romberg.

## 2023-03-21 NOTE — Progress Notes (Signed)
Chief Complaint: Right hip pain     History of Present Illness:    Chris [name] is a 62 y.o. male with history of stage IV non-small cell lung cancer with mets to liver and right femur presenting today for evaluation of right hip pain.  5 months ago he was found to have an impending pathologic right femur fracture and had an IM nail placed by Ortho oncology at [facility].  Last follow-up was 02/05/2023 at which time he was at able to ambulate with a cane however was having continued pain in the right hip and thigh.  X-rays at that visit showed " Healing pathologic subtrochanteric right femoral fracture status post cephalomedullary nail fixation. Hardware is intact. No change in fracture alignment" per report.  Patient was also just seen by neurology and has an EMG scheduled for 04/01/2023.  Today patient presents with severe right hip pain located in the groin and over the lateral hip.  He says the pain often feels like his thigh is being electrocuted.  Does have some numbness going down the leg.  States that he feels like his hip is declined over the past 3 weeks and is hardly able to weight-bear or actively move the hip.  Has a prescription for Norco however takes these intermittently as he wants to avoid any sedative side effects.  Does recall an incident this past Tuesday while going down a couple stairs at home during which he was able to prevent a fall to the ground however had to awkwardly twist in order to maintain balance.  Has been getting around with use of 2 canes while out of the house however most of the time requires a walker or wheelchair.   Surgical History:   Right femoral IM nail 09/18/2022  PMH/PSH/Family History/Social History/Meds/Allergies:    Past Medical History:  Diagnosis Date   Anxiety    associated with medical care,  worsened by difficulty hearing, does better with wife present   Complication of anesthesia    wife states very anxious  may need pre sedation   Coronary artery disease    GERD (gastroesophageal reflux disease)    Hearing loss    mild per wife   Hearing loss    no hearing aids per wife   Hyperlipidemia    Hypertension    MI (myocardial infarction) (HCC) 04/17/2006   Acute inferolateral wall MI with cardiogenic shock and complete heart block   Primary adenocarcinoma of upper lobe of right lung (HCC)    Prostate cancer (HCC)    Tobacco abuse    Wears glasses    Wears partial dentures    Upper   Past Surgical History:  Procedure Laterality Date   BRONCHIAL BIOPSY  10/09/2021   Procedure: BRONCHIAL BIOPSIES;  Surgeon: Leslye Peer, MD;  Location: [facility] ENDOSCOPY;  Service: Pulmonary;;   BRONCHIAL BRUSHINGS  10/09/2021   Procedure: BRONCHIAL BRUSHINGS;  Surgeon: Leslye Peer, MD;  Location: [facility] ENDOSCOPY;  Service: Pulmonary;;   BRONCHIAL NEEDLE ASPIRATION BIOPSY  10/09/2021   Procedure: BRONCHIAL NEEDLE ASPIRATION BIOPSIES;  Surgeon: Leslye Peer, MD;  Location: [facility] ENDOSCOPY;  Service: Pulmonary;;   CARDIAC CATHETERIZATION  2007   left, RCA 100% occluded ruptured plaque with thrombus in the proximal segment   CYST EXCISION N/A 08/30/2021   Procedure: EXCISION  OF POSTERIOR SCALP CYST;  Surgeon: Berna Bue, MD;  Location: [facility] ORS;  Service: General;  Laterality: N/A;   CYSTOSCOPY N/A 07/17/2018   Procedure: Bluford Kaufmann;  Surgeon: Marcine Matar, MD;  Location: [facility];  Service: Urology;  Laterality: N/A;  no seeds in bladder per Dr Retta Diones   FIDUCIAL MARKER PLACEMENT  10/09/2021   Procedure: FIDUCIAL MARKER PLACEMENT;  Surgeon: Leslye Peer, MD;  Location[facility] ENDOSCOPY;  Service: Pulmonary;;   HAND SURGERY Right    has metal plate in arm   PROSTATE BIOPSY     RADIOACTIVE SEED IMPLANT N/A 07/17/2018   Procedure: RADIOACTIVE SEED IMPLANT/BRACHYTHERAPY IMPLANT;  Surgeon: Marcine Matar, MD;  Location: [facility];  Service: Urology;  Laterality:  N/A;  77 seeds   SPACE OAR INSTILLATION N/A 07/17/2018   Procedure: SPACE OAR INSTILLATION;  Surgeon: Marcine Matar, MD;  Location[facility];  Service: Urology;  Laterality: N/A;   VIDEO BRONCHOSCOPY WITH RADIAL ENDOBRONCHIAL ULTRASOUND  10/09/2021   Procedure: VIDEO BRONCHOSCOPY WITH RADIAL ENDOBRONCHIAL ULTRASOUND;  Surgeon: Leslye Peer, MD;  Location: [facility] ENDOSCOPY;  Service: Pulmonary;;   WRIST SURGERY  10/04/2012   Social History   Socioeconomic History   Marital status: Married    Spouse name: Not on file   Number of children: Not on file   Years of education: Not on file   Highest education level: Not on file  Occupational History   Not on file  Tobacco Use   Smoking status: Every Day    Packs/day: 1.50    Years: 42.00    Additional pack years: 0.00    Total pack years: 63.00    Types: Cigarettes   Smokeless tobacco: Never  Vaping Use   Vaping Use: Never used  Substance and Sexual Activity   Alcohol use: No   Drug use: No   Sexual activity: Yes    Birth control/protection: None  Other Topics Concern   Not on file  Social History Narrative   10-04 19 Unable to ask abuse questions wife with him today.   Are you right handed or left handed? Ambidextrous prominent right   Are you currently employed ? 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 no   Who lives with you? Wife and patient   What type of home do you live in: 1 story or 2 story? 1 story       Social Determinants of Corporate investment banker Strain: Not on file  Food Insecurity: Not on file  Transportation Needs: Not on file  Physical Activity: Not on file  Stress: Not on file  Social Connections: Not on file   Family History  Problem Relation Age of Onset   Breast cancer Mother    Prostate cancer Neg Hx    Kidney cancer Neg Hx    Cancer Neg Hx    No Known Allergies Current Outpatient Medications  Medication Sig Dispense  Refill   ALPRAZolam (XANAX) 0.25 MG tablet Take 1 tablet (0.25 mg total) by mouth at bedtime. (Patient not taking: Reported on 03/20/2023) 15 tablet 0   atorvastatin (LIPITOR) 20 MG tablet Take 1 tablet (20 mg total) by mouth daily. 90 tablet 3   clopidogrel (PLAVIX) 75 MG tablet TAKE 1 TABLET BY MOUTH EVERY DAY 90 tablet 2   HYDROcodone-acetaminophen (NORCO) 5-325 MG tablet Take 1 tablet by mouth every 4 (four) hours as needed  for moderate pain. 30 tablet 0   ketoconazole (NIZORAL) 2 % cream Apply 1 application  topically 2 (two) times daily as needed for irritation.     metoprolol tartrate (LOPRESSOR) 25 MG tablet TAKE 1 TABLET (25 MG TOTAL) BY MOUTH DAILY. 90 tablet 2   pantoprazole (PROTONIX) 40 MG tablet Take 1 tablet (40 mg total) by mouth daily. 60 tablet 1   sertraline (ZOLOFT) 100 MG tablet TAKE 1 TABLET BY MOUTH EVERY DAY (Patient taking differently: Take 50 mg by mouth daily.) 90 tablet 0   tamsulosin (FLOMAX) 0.4 MG CAPS capsule Take 1 capsule (0.4 mg total) by mouth daily. 30 capsule 3   Vitamin D, Ergocalciferol, (DRISDOL) 1.25 MG (50000 UNIT) CAPS capsule TAKE 1 CAPSULE BY MOUTH ONE TIME PER WEEK 12 capsule 1   No current facility-administered medications for this visit.   No results found.  Review of Systems:   A ROS was performed including pertinent positives and negatives as documented in the HPI.  Physical Exam :   Constitutional: NAD and appears stated age Neurological: Alert and oriented Psych: Appropriate affect and cooperative There were no vitals taken for this visit.   Comprehensive Musculoskeletal Exam:    Patient is unable to weight-bear through the right leg and is brought into clinic via wheelchair.  Unable to flex the hip without assistance using his hands.  Knee flexion and extension is extremely limited due to pain.  Distal neurosensory exam intact.  Imaging:   Xray (Right femur 4 views and AP pelvis): Worsened subtrochanteric fracture with mild varus  angulation and failure of the IM nail at the lag screw.   I personally reviewed and interpreted the radiographs.   Assessment:   62 y.o. male with severe right hip pain.  He did have an IM nail placed about 5 months ago for a  subtrochanteric fracture suspected to be from lung cancer metastases.  Based on today's x-rays it does appear that unfortunately the IM nail has failed and the fracture has significantly worsened.  It is unclear if this was due to some type of trauma or as result of nonunion, however there has been no known direct or significant impact in the last month.  After discussion of the case with Dr. Steward Drone, it is likely that he will have to have this surgically revised.  I would like to get him referred stat to Dr. Jena Gauss for evaluation and discussion of treatment options.  In the meantime I have recommended avoiding weightbearing as much as possible.  Plan :    - Referral to Dr. Jena Gauss for further assessment and management     I personally saw and evaluated the patient, and participated in the management and treatment plan.  Hazle Nordmann, PA-C Orthopedics  This document was dictated using Conservation officer, historic buildings. A reasonable attempt at proof reading has been made to minimize errors.

## 2023-03-22 ENCOUNTER — Encounter (HOSPITAL_BASED_OUTPATIENT_CLINIC_OR_DEPARTMENT_OTHER): Payer: Self-pay

## 2023-03-22 ENCOUNTER — Other Ambulatory Visit: Payer: Self-pay | Admitting: Internal Medicine

## 2023-03-22 NOTE — Telephone Encounter (Signed)
Returned call to pt and left message to get in touch. Will plan on calling Tuesday to follow up if I haven't heard anything before then.

## 2023-03-26 ENCOUNTER — Encounter (HOSPITAL_BASED_OUTPATIENT_CLINIC_OR_DEPARTMENT_OTHER): Payer: Self-pay

## 2023-03-26 ENCOUNTER — Telehealth: Payer: Self-pay | Admitting: Student

## 2023-03-26 ENCOUNTER — Encounter: Payer: Self-pay | Admitting: Cardiovascular Disease

## 2023-03-26 NOTE — Telephone Encounter (Signed)
Spoke with pt's wife concerning scheduling for surgery. Will have her reach out to [place] if they haven't heard anything by late tomorrow.

## 2023-03-26 NOTE — Telephone Encounter (Signed)
Per last note you said you would call him today

## 2023-03-26 NOTE — Telephone Encounter (Signed)
Patient advising she has some questions and wants to speak to someone ASAP.

## 2023-03-27 ENCOUNTER — Inpatient Hospital Stay (HOSPITAL_BASED_OUTPATIENT_CLINIC_OR_DEPARTMENT_OTHER): Payer: BC Managed Care – PPO | Admitting: Hospice and Palliative Medicine

## 2023-03-27 ENCOUNTER — Encounter: Payer: Self-pay | Admitting: Neurology

## 2023-03-27 ENCOUNTER — Encounter: Payer: Self-pay | Admitting: Hospice and Palliative Medicine

## 2023-03-27 ENCOUNTER — Telehealth: Payer: Self-pay | Admitting: *Deleted

## 2023-03-27 ENCOUNTER — Encounter (HOSPITAL_BASED_OUTPATIENT_CLINIC_OR_DEPARTMENT_OTHER): Payer: Self-pay

## 2023-03-27 DIAGNOSIS — Z515 Encounter for palliative care: Secondary | ICD-10-CM

## 2023-03-27 DIAGNOSIS — G893 Neoplasm related pain (acute) (chronic): Secondary | ICD-10-CM

## 2023-03-27 MED ORDER — OXYCODONE HCL ER 10 MG PO T12A: 10.0000 mg | EXTENDED_RELEASE_TABLET | Freq: Two times a day (BID) | ORAL | 0 refills | Status: DC

## 2023-03-27 NOTE — Telephone Encounter (Signed)
Josh, pt's wife sent mychart msg to you to request for a phone call. I reached back to her via mychart to see if there was anything in particular she would like to discuss.

## 2023-03-27 NOTE — Telephone Encounter (Signed)
Pt called to let me know they got scheduled with Dr Jena Gauss on Monday for consult.

## 2023-03-27 NOTE — Progress Notes (Signed)
I spoke with patient's wife.  She says that patient's pain is improved on oxycodone.  However, she is having to set a timer to ensure that patient gets oxycodone every 4 hours around-the-clock or his pain becomes intolerable.  Denies any adverse effects from pain medications.  Patient has an appointment with a trauma surgeon on Monday to discuss possible surgical intervention.  She understands that cancer treatment will likely be delayed as a result of possible need for surgery.  Discussed options for pain management.  Will start patient on OxyContin to provide longer acting pain control.  Continue oxycodone IR as needed for breakthrough pain.

## 2023-03-28 ENCOUNTER — Other Ambulatory Visit: Payer: Self-pay | Admitting: *Deleted

## 2023-03-28 ENCOUNTER — Encounter: Payer: Self-pay | Admitting: Hospice and Palliative Medicine

## 2023-03-28 ENCOUNTER — Telehealth: Payer: Self-pay | Admitting: *Deleted

## 2023-03-28 ENCOUNTER — Other Ambulatory Visit: Payer: Self-pay | Admitting: Hospice and Palliative Medicine

## 2023-03-28 ENCOUNTER — Encounter: Payer: Self-pay | Admitting: *Deleted

## 2023-03-28 ENCOUNTER — Encounter: Payer: Self-pay | Admitting: Internal Medicine

## 2023-03-28 ENCOUNTER — Other Ambulatory Visit: Payer: Self-pay

## 2023-03-28 MED ORDER — OXYCODONE HCL ER 10 MG PO T12A
10.0000 mg | EXTENDED_RELEASE_TABLET | Freq: Two times a day (BID) | ORAL | 0 refills | Status: DC
  Filled 2023-03-28 (×3): qty 30, 15d supply, fill #0

## 2023-03-28 MED ORDER — XTAMPZA ER 9 MG PO C12A
1.0000 | EXTENDED_RELEASE_CAPSULE | Freq: Two times a day (BID) | ORAL | 0 refills | Status: DC
  Filled 2023-03-28 – 2023-03-29 (×2): qty 30, 15d supply, fill #0

## 2023-03-28 MED ORDER — OXYCODONE HCL 10 MG PO TABS
10.0000 mg | ORAL_TABLET | ORAL | 0 refills | Status: DC | PRN
  Filled 2023-03-28: qty 60, 10d supply, fill #0

## 2023-03-28 NOTE — Telephone Encounter (Signed)
Per [employer]- wife is unable to get oxycodone 10 mg from pharmacy- medication is not available. RN spoke with Sharia Reeve, NP. Ok to send scripts to [facility]. Np confirmed that [facility] pharmacy has med in stock. I spoke with wife. She is agreeable and in route to [facility].

## 2023-03-28 NOTE — Telephone Encounter (Signed)
Pa submitted - [facility] (Key: Ephraim Hamburger) - OxyCONTIN 10MG  er tablets- insurance prefers xtampza  Pa submitted - [facility] (Key: Jose Persia)- Marlowe Kays ER 9MG  er capsules- pending insurance approval.

## 2023-03-28 NOTE — Progress Notes (Signed)
[facility] did not have ox[facility]codone in stock. Rx sent to [facility] pharmacy.

## 2023-03-28 NOTE — Telephone Encounter (Signed)
Rx sent to [facility] 

## 2023-03-28 NOTE — Addendum Note (Signed)
Addended by: Malachy Moan on: 03/28/2023 03:56 PM   Modules accepted: Orders

## 2023-03-29 ENCOUNTER — Other Ambulatory Visit: Payer: Self-pay

## 2023-03-29 ENCOUNTER — Telehealth: Payer: Self-pay | Admitting: *Deleted

## 2023-03-29 ENCOUNTER — Other Ambulatory Visit: Payer: Self-pay | Admitting: Hospice and Palliative Medicine

## 2023-03-29 NOTE — Telephone Encounter (Signed)
NOTICE OF APPROVAL- [employer] for Xtampza sent to his chart.

## 2023-03-29 NOTE — Telephone Encounter (Signed)
CAll from [facility] reporting that the medical reviewer has denied to PA for this patient Oxycodone and said you can call [phone] opt 3,then opt 1 for questions

## 2023-04-01 ENCOUNTER — Ambulatory Visit: Payer: Self-pay | Admitting: Student

## 2023-04-01 ENCOUNTER — Encounter: Payer: BC Managed Care – PPO | Admitting: Neurology

## 2023-04-01 ENCOUNTER — Encounter: Payer: Self-pay | Admitting: Internal Medicine

## 2023-04-01 ENCOUNTER — Telehealth: Payer: Self-pay | Admitting: *Deleted

## 2023-04-01 DIAGNOSIS — S7224XK Nondisplaced subtrochanteric fracture of right femur, subsequent encounter for closed fracture with nonunion: Secondary | ICD-10-CM | POA: Diagnosis not present

## 2023-04-01 DIAGNOSIS — K5900 Constipation, unspecified: Secondary | ICD-10-CM

## 2023-04-01 MED ORDER — SENNA-DOCUSATE SODIUM 8.6-50 MG PO TABS
1.0000 | ORAL_TABLET | Freq: Every day | ORAL | 1 refills | Status: DC
Start: 2023-04-01 — End: 2024-06-12

## 2023-04-01 NOTE — Progress Notes (Signed)
Called and spoke with patient's wife patient regarding the upcoming surgery.  Wife will give [place] a call regarding follow-up/starting treatment again.

## 2023-04-01 NOTE — Telephone Encounter (Addendum)
I returned wife's phone call. Pt met with trauma surgeon today. The providers will operate on pt's hip- Wed 04/03/23-at [facility]. Wife wanted me to inform Dr B as well.  Per wife, pt needed script for Senokot-S sent to his pharmacy. I explained to wife that this is available OTC. Wife stated that pt has already met his deductible for the year and can get this otc med cheaper with script from doctor. Script sent to pharmacy for otc Senokot S

## 2023-04-02 ENCOUNTER — Inpatient Hospital Stay: Payer: BC Managed Care – PPO | Attending: Internal Medicine | Admitting: Hospice and Palliative Medicine

## 2023-04-02 ENCOUNTER — Encounter (HOSPITAL_COMMUNITY): Payer: Self-pay | Admitting: Student

## 2023-04-02 ENCOUNTER — Other Ambulatory Visit: Payer: Self-pay

## 2023-04-02 DIAGNOSIS — G893 Neoplasm related pain (acute) (chronic): Secondary | ICD-10-CM

## 2023-04-02 NOTE — Progress Notes (Signed)
Mr. Brimmer wants Mrs. Roblero to take calls.  Ms Ferretiz reports that Mr. Lassonde has not complained of chest  pain, patient is short of breath much of    the time.  Mrs Nordland denies having any s/s of Covid in her household, also denies any known exposure to Covid and denies that Mr. Juaire has had any s/s of upper or lower respiratory infections in the past 8 weeks.   Mr. Fiebiger cardiologist is Dr. Erlene Quan and patient sees oncologist,

## 2023-04-02 NOTE — Progress Notes (Signed)
I spoke with patient's wife.  Patient is scheduled for surgery tomorrow at [facility].  Pain has been improved on current regimen but patient is essentially chair/bedbound. Will plan follow up in the clinic after discharge from the hospital.

## 2023-04-03 ENCOUNTER — Observation Stay (HOSPITAL_COMMUNITY): Payer: BC Managed Care – PPO

## 2023-04-03 ENCOUNTER — Ambulatory Visit (HOSPITAL_COMMUNITY): Payer: BC Managed Care – PPO | Admitting: Anesthesiology

## 2023-04-03 ENCOUNTER — Observation Stay (HOSPITAL_COMMUNITY)
Admission: RE | Admit: 2023-04-03 | Discharge: 2023-04-04 | Disposition: A | Discharge status: Home Health | Payer: BC Managed Care – PPO | Attending: Student | Admitting: Student

## 2023-04-03 ENCOUNTER — Encounter: Payer: Self-pay | Admitting: Hospice and Palliative Medicine

## 2023-04-03 ENCOUNTER — Ambulatory Visit (HOSPITAL_COMMUNITY): Payer: BC Managed Care – PPO

## 2023-04-03 ENCOUNTER — Encounter (HOSPITAL_COMMUNITY): Payer: Self-pay | Admitting: Student

## 2023-04-03 ENCOUNTER — Encounter (HOSPITAL_COMMUNITY)
Admission: RE | Disposition: A | Discharge status: Home Health | Payer: Self-pay | Source: Home / Self Care | Attending: Student

## 2023-04-03 DIAGNOSIS — Z7902 Long term (current) use of antithrombotics/antiplatelets: Secondary | ICD-10-CM | POA: Diagnosis not present

## 2023-04-03 DIAGNOSIS — I10 Essential (primary) hypertension: Secondary | ICD-10-CM | POA: Insufficient documentation

## 2023-04-03 DIAGNOSIS — S7221XK Displaced subtrochanteric fracture of right femur, subsequent encounter for closed fracture with nonunion: Secondary | ICD-10-CM | POA: Diagnosis not present

## 2023-04-03 DIAGNOSIS — Z8546 Personal history of malignant neoplasm of prostate: Secondary | ICD-10-CM | POA: Diagnosis not present

## 2023-04-03 DIAGNOSIS — S72301K Unspecified fracture of shaft of right femur, subsequent encounter for closed fracture with nonunion: Secondary | ICD-10-CM | POA: Diagnosis not present

## 2023-04-03 DIAGNOSIS — Z981 Arthrodesis status: Secondary | ICD-10-CM | POA: Diagnosis not present

## 2023-04-03 DIAGNOSIS — Y792 Prosthetic and other implants, materials and accessory orthopedic devices associated with adverse incidents: Secondary | ICD-10-CM | POA: Insufficient documentation

## 2023-04-03 DIAGNOSIS — F17210 Nicotine dependence, cigarettes, uncomplicated: Secondary | ICD-10-CM | POA: Insufficient documentation

## 2023-04-03 DIAGNOSIS — I2510 Atherosclerotic heart disease of native coronary artery without angina pectoris: Secondary | ICD-10-CM | POA: Insufficient documentation

## 2023-04-03 DIAGNOSIS — S72001K Fracture of unspecified part of neck of right femur, subsequent encounter for closed fracture with nonunion: Secondary | ICD-10-CM | POA: Diagnosis not present

## 2023-04-03 DIAGNOSIS — S72141K Displaced intertrochanteric fracture of right femur, subsequent encounter for closed fracture with nonunion: Secondary | ICD-10-CM | POA: Diagnosis present

## 2023-04-03 DIAGNOSIS — S72001A Fracture of unspecified part of neck of right femur, initial encounter for closed fracture: Secondary | ICD-10-CM | POA: Diagnosis not present

## 2023-04-03 DIAGNOSIS — T84114A Breakdown (mechanical) of internal fixation device of right femur, initial encounter: Secondary | ICD-10-CM | POA: Insufficient documentation

## 2023-04-03 DIAGNOSIS — I252 Old myocardial infarction: Secondary | ICD-10-CM | POA: Diagnosis not present

## 2023-04-03 HISTORY — DX: Dyspnea, unspecified: R06.00

## 2023-04-03 HISTORY — DX: Unspecified osteoarthritis, unspecified site: M19.90

## 2023-04-03 HISTORY — PX: HARDWARE REMOVAL: SHX979

## 2023-04-03 HISTORY — DX: Secondary malignant neoplasm of liver and intrahepatic bile duct: C78.7

## 2023-04-03 HISTORY — DX: Sleep apnea, unspecified: G47.30

## 2023-04-03 HISTORY — PX: FEMUR IM NAIL: SHX1597

## 2023-04-03 LAB — CBC
HCT: 42.3 % (ref 39.0–52.0)
Hemoglobin: 14.1 g/dL (ref 13.0–17.0)
MCH: 29.7 pg (ref 26.0–34.0)
MCHC: 33.3 g/dL (ref 30.0–36.0)
MCV: 89.1 fL (ref 80.0–100.0)
Platelets: 219 10*3/uL (ref 150–400)
RBC: 4.75 MIL/uL (ref 4.22–5.81)
RDW: 14.1 % (ref 11.5–15.5)
WBC: 10.5 10*3/uL (ref 4.0–10.5)
nRBC: 0 % (ref 0.0–0.2)

## 2023-04-03 LAB — CREATININE, SERUM
Creatinine, Ser: 1.01 mg/dL (ref 0.61–1.24)
GFR, Estimated: >60 mL/min (ref >60)

## 2023-04-03 SURGERY — INSERTION, INTRAMEDULLARY ROD, FEMUR
Anesthesia: General | Site: Leg Upper | Laterality: Right

## 2023-04-03 MED ORDER — ACETAMINOPHEN 10 MG/ML IV SOLN
INTRAVENOUS | Status: AC
  Filled 2023-04-03: qty 100

## 2023-04-03 MED ORDER — METOCLOPRAMIDE HCL 5 MG PO TABS: 5.0000 mg | ORAL_TABLET | Freq: Three times a day (TID) | ORAL | Status: DC | PRN

## 2023-04-03 MED ORDER — HYDRALAZINE HCL 10 MG PO TABS: 10.0000 mg | ORAL_TABLET | Freq: Four times a day (QID) | ORAL | Status: DC | PRN

## 2023-04-03 MED ORDER — ACETAMINOPHEN 10 MG/ML IV SOLN
INTRAVENOUS | Status: DC | PRN
  Administered 2023-04-03: 1000 mg via INTRAVENOUS

## 2023-04-03 MED ORDER — VANCOMYCIN HCL 1000 MG IV SOLR
INTRAVENOUS | Status: AC
  Filled 2023-04-03: qty 20

## 2023-04-03 MED ORDER — METOCLOPRAMIDE HCL 5 MG/ML IJ SOLN: 5.0000 mg | Freq: Three times a day (TID) | INTRAMUSCULAR | Status: DC | PRN

## 2023-04-03 MED ORDER — DOCUSATE SODIUM 100 MG PO CAPS
100.0000 mg | ORAL_CAPSULE | Freq: Two times a day (BID) | ORAL | Status: DC
  Administered 2023-04-03 – 2023-04-04 (×2): 100 mg via ORAL
  Filled 2023-04-03 (×3): qty 1

## 2023-04-03 MED ORDER — HYDROMORPHONE HCL 1 MG/ML IJ SOLN: 0.5000 mg | INTRAMUSCULAR | Status: DC | PRN

## 2023-04-03 MED ORDER — SODIUM CHLORIDE 0.9 % IV SOLN: INTRAVENOUS | Status: DC

## 2023-04-03 MED ORDER — ALPRAZOLAM 0.25 MG PO TABS: 0.2500 mg | ORAL_TABLET | Freq: Every evening | ORAL | Status: DC | PRN

## 2023-04-03 MED ORDER — DEXAMETHASONE SODIUM PHOSPHATE 10 MG/ML IJ SOLN
INTRAMUSCULAR | Status: DC | PRN
  Administered 2023-04-03: 10 mg via INTRAVENOUS

## 2023-04-03 MED ORDER — ONDANSETRON HCL 4 MG/2ML IJ SOLN
INTRAMUSCULAR | Status: AC
  Filled 2023-04-03: qty 2

## 2023-04-03 MED ORDER — ACETAMINOPHEN 160 MG/5ML PO SOLN: 1000.0000 mg | Freq: Once | ORAL | Status: DC | PRN

## 2023-04-03 MED ORDER — FLUTICASONE PROPIONATE 50 MCG/ACT NA SUSP: 1.0000 | Freq: Every day | NASAL | Status: DC | PRN

## 2023-04-03 MED ORDER — LIDOCAINE 2% (20 MG/ML) 5 ML SYRINGE
INTRAMUSCULAR | Status: DC | PRN
  Administered 2023-04-03: 60 mg via INTRAVENOUS

## 2023-04-03 MED ORDER — ONDANSETRON HCL 4 MG/2ML IJ SOLN: 4.0000 mg | Freq: Four times a day (QID) | INTRAMUSCULAR | Status: DC | PRN

## 2023-04-03 MED ORDER — PHENYLEPHRINE HCL-NACL 20-0.9 MG/250ML-% IV SOLN
INTRAVENOUS | Status: DC | PRN
  Administered 2023-04-03: 25 ug/min via INTRAVENOUS

## 2023-04-03 MED ORDER — METOPROLOL TARTRATE 25 MG PO TABS
25.0000 mg | ORAL_TABLET | Freq: Every day | ORAL | Status: DC
  Administered 2023-04-04: 25 mg via ORAL
  Filled 2023-04-03: qty 1

## 2023-04-03 MED ORDER — PANTOPRAZOLE SODIUM 40 MG PO TBEC
40.0000 mg | DELAYED_RELEASE_TABLET | Freq: Every day | ORAL | Status: DC
  Administered 2023-04-04: 40 mg via ORAL
  Filled 2023-04-03: qty 1

## 2023-04-03 MED ORDER — METHOCARBAMOL 500 MG PO TABS
500.0000 mg | ORAL_TABLET | Freq: Four times a day (QID) | ORAL | Status: DC | PRN
  Administered 2023-04-03: 500 mg via ORAL
  Filled 2023-04-03: qty 1

## 2023-04-03 MED ORDER — OXYCODONE HCL ER 10 MG PO T12A
10.0000 mg | EXTENDED_RELEASE_TABLET | Freq: Two times a day (BID) | ORAL | Status: DC
  Administered 2023-04-03 – 2023-04-04 (×3): 10 mg via ORAL
  Filled 2023-04-03 (×3): qty 1

## 2023-04-03 MED ORDER — ACETAMINOPHEN 10 MG/ML IV SOLN: 1000.0000 mg | Freq: Once | INTRAVENOUS | Status: DC | PRN

## 2023-04-03 MED ORDER — EPHEDRINE 5 MG/ML INJ
INTRAVENOUS | Status: AC
  Filled 2023-04-03: qty 5

## 2023-04-03 MED ORDER — MIDAZOLAM HCL 2 MG/2ML IJ SOLN
INTRAMUSCULAR | Status: AC
  Filled 2023-04-03: qty 2

## 2023-04-03 MED ORDER — METHOCARBAMOL 1000 MG/10ML IJ SOLN: 500.0000 mg | Freq: Four times a day (QID) | INTRAVENOUS | Status: DC | PRN

## 2023-04-03 MED ORDER — PROPOFOL 10 MG/ML IV BOLUS
INTRAVENOUS | Status: DC | PRN
  Administered 2023-04-03: 40 mg via INTRAVENOUS
  Administered 2023-04-03: 160 mg via INTRAVENOUS

## 2023-04-03 MED ORDER — OXYCODONE HCL 5 MG PO TABS
10.0000 mg | ORAL_TABLET | ORAL | Status: DC | PRN
  Administered 2023-04-03 – 2023-04-04 (×3): 15 mg via ORAL
  Filled 2023-04-03 (×3): qty 3

## 2023-04-03 MED ORDER — ENOXAPARIN SODIUM 40 MG/0.4ML IJ SOSY
40.0000 mg | PREFILLED_SYRINGE | INTRAMUSCULAR | Status: DC
  Administered 2023-04-04: 40 mg via SUBCUTANEOUS
  Filled 2023-04-03: qty 0.4

## 2023-04-03 MED ORDER — ACETAMINOPHEN 500 MG PO TABS
1000.0000 mg | ORAL_TABLET | Freq: Four times a day (QID) | ORAL | Status: DC
  Administered 2023-04-03 – 2023-04-04 (×3): 1000 mg via ORAL
  Filled 2023-04-03 (×3): qty 2

## 2023-04-03 MED ORDER — FENTANYL CITRATE (PF) 250 MCG/5ML IJ SOLN
INTRAMUSCULAR | Status: AC
  Filled 2023-04-03: qty 5

## 2023-04-03 MED ORDER — ENOXAPARIN SODIUM 40 MG/0.4ML IJ SOSY: 40.0000 mg | PREFILLED_SYRINGE | INTRAMUSCULAR | Status: DC

## 2023-04-03 MED ORDER — SENNOSIDES-DOCUSATE SODIUM 8.6-50 MG PO TABS
1.0000 | ORAL_TABLET | Freq: Every day | ORAL | Status: DC
  Administered 2023-04-03 – 2023-04-04 (×2): 1 via ORAL
  Filled 2023-04-03 (×5): qty 1

## 2023-04-03 MED ORDER — CHLORHEXIDINE GLUCONATE 0.12 % MT SOLN
OROMUCOSAL | Status: AC
  Administered 2023-04-03: 15 mL via OROMUCOSAL
  Filled 2023-04-03: qty 15

## 2023-04-03 MED ORDER — OXYCODONE HCL 5 MG/5ML PO SOLN: 5.0000 mg | Freq: Once | ORAL | Status: DC | PRN

## 2023-04-03 MED ORDER — FENTANYL CITRATE (PF) 250 MCG/5ML IJ SOLN
INTRAMUSCULAR | Status: DC | PRN
  Administered 2023-04-03 (×2): 25 ug via INTRAVENOUS
  Administered 2023-04-03: 50 ug via INTRAVENOUS
  Administered 2023-04-03: 100 ug via INTRAVENOUS

## 2023-04-03 MED ORDER — VANCOMYCIN HCL 1 G IV SOLR
INTRAVENOUS | Status: DC | PRN
  Administered 2023-04-03: 1000 mg via TOPICAL

## 2023-04-03 MED ORDER — PROPOFOL 10 MG/ML IV BOLUS
INTRAVENOUS | Status: AC
  Filled 2023-04-03: qty 20

## 2023-04-03 MED ORDER — TRANEXAMIC ACID-NACL 1000-0.7 MG/100ML-% IV SOLN
1000.0000 mg | INTRAVENOUS | Status: AC
  Administered 2023-04-03: 1000 mg via INTRAVENOUS
  Filled 2023-04-03: qty 100

## 2023-04-03 MED ORDER — ONDANSETRON HCL 4 MG PO TABS: 4.0000 mg | ORAL_TABLET | Freq: Four times a day (QID) | ORAL | Status: DC | PRN

## 2023-04-03 MED ORDER — ORAL CARE MOUTH RINSE: 15.0000 mL | Freq: Once | OROMUCOSAL | Status: AC

## 2023-04-03 MED ORDER — FENTANYL CITRATE (PF) 100 MCG/2ML IJ SOLN
INTRAMUSCULAR | Status: AC
  Filled 2023-04-03: qty 2

## 2023-04-03 MED ORDER — SERTRALINE HCL 50 MG PO TABS
50.0000 mg | ORAL_TABLET | Freq: Every day | ORAL | Status: DC
  Administered 2023-04-04: 50 mg via ORAL
  Filled 2023-04-03: qty 1

## 2023-04-03 MED ORDER — ROCURONIUM BROMIDE 10 MG/ML (PF) SYRINGE
PREFILLED_SYRINGE | INTRAVENOUS | Status: AC
  Filled 2023-04-03: qty 10

## 2023-04-03 MED ORDER — LACTATED RINGERS IV SOLN: INTRAVENOUS | Status: DC

## 2023-04-03 MED ORDER — CHLORHEXIDINE GLUCONATE 0.12 % MT SOLN: 15.0000 mL | Freq: Once | OROMUCOSAL | Status: AC

## 2023-04-03 MED ORDER — CEFAZOLIN SODIUM-DEXTROSE 2-4 GM/100ML-% IV SOLN
2.0000 g | Freq: Three times a day (TID) | INTRAVENOUS | Status: AC
  Administered 2023-04-03 – 2023-04-04 (×3): 2 g via INTRAVENOUS
  Filled 2023-04-03 (×3): qty 100

## 2023-04-03 MED ORDER — ROCURONIUM BROMIDE 10 MG/ML (PF) SYRINGE
PREFILLED_SYRINGE | INTRAVENOUS | Status: DC | PRN
  Administered 2023-04-03: 20 mg via INTRAVENOUS
  Administered 2023-04-03: 10 mg via INTRAVENOUS
  Administered 2023-04-03: 70 mg via INTRAVENOUS

## 2023-04-03 MED ORDER — 0.9 % SODIUM CHLORIDE (POUR BTL) OPTIME
TOPICAL | Status: DC | PRN
  Administered 2023-04-03: 1000 mL

## 2023-04-03 MED ORDER — ATORVASTATIN CALCIUM 10 MG PO TABS
20.0000 mg | ORAL_TABLET | Freq: Every day | ORAL | Status: DC
  Administered 2023-04-04: 20 mg via ORAL
  Filled 2023-04-03: qty 2

## 2023-04-03 MED ORDER — EPHEDRINE SULFATE-NACL 50-0.9 MG/10ML-% IV SOSY
PREFILLED_SYRINGE | INTRAVENOUS | Status: DC | PRN
  Administered 2023-04-03: 5 mg via INTRAVENOUS

## 2023-04-03 MED ORDER — POLYETHYLENE GLYCOL 3350 17 G PO PACK: 17.0000 g | PACK | Freq: Every day | ORAL | Status: DC | PRN

## 2023-04-03 MED ORDER — MIDAZOLAM HCL 5 MG/5ML IJ SOLN
INTRAMUSCULAR | Status: DC | PRN
  Administered 2023-04-03: 2 mg via INTRAVENOUS

## 2023-04-03 MED ORDER — PHENYLEPHRINE 80 MCG/ML (10ML) SYRINGE FOR IV PUSH (FOR BLOOD PRESSURE SUPPORT)
PREFILLED_SYRINGE | INTRAVENOUS | Status: AC
  Filled 2023-04-03: qty 10

## 2023-04-03 MED ORDER — ACETAMINOPHEN 500 MG PO TABS: 1000.0000 mg | ORAL_TABLET | Freq: Once | ORAL | Status: DC | PRN

## 2023-04-03 MED ORDER — CEFAZOLIN SODIUM-DEXTROSE 2-4 GM/100ML-% IV SOLN
2.0000 g | INTRAVENOUS | Status: AC
  Administered 2023-04-03: 2 g via INTRAVENOUS
  Filled 2023-04-03: qty 100

## 2023-04-03 MED ORDER — ONDANSETRON HCL 4 MG/2ML IJ SOLN
INTRAMUSCULAR | Status: DC | PRN
  Administered 2023-04-03: 4 mg via INTRAVENOUS

## 2023-04-03 MED ORDER — OXYCODONE HCL 5 MG PO TABS: 5.0000 mg | ORAL_TABLET | Freq: Once | ORAL | Status: DC | PRN

## 2023-04-03 MED ORDER — SUGAMMADEX SODIUM 200 MG/2ML IV SOLN
INTRAVENOUS | Status: DC | PRN
  Administered 2023-04-03: 25 mg via INTRAVENOUS
  Administered 2023-04-03 (×2): 100 mg via INTRAVENOUS

## 2023-04-03 MED ORDER — PHENYLEPHRINE 80 MCG/ML (10ML) SYRINGE FOR IV PUSH (FOR BLOOD PRESSURE SUPPORT)
PREFILLED_SYRINGE | INTRAVENOUS | Status: DC | PRN
  Administered 2023-04-03 (×5): 160 ug via INTRAVENOUS

## 2023-04-03 MED ORDER — FENTANYL CITRATE (PF) 100 MCG/2ML IJ SOLN
25.0000 ug | INTRAMUSCULAR | Status: DC | PRN
  Administered 2023-04-03 (×2): 25 ug via INTRAVENOUS

## 2023-04-03 MED ORDER — DEXAMETHASONE SODIUM PHOSPHATE 10 MG/ML IJ SOLN
INTRAMUSCULAR | Status: AC
  Filled 2023-04-03: qty 1

## 2023-04-03 MED ORDER — TAMSULOSIN HCL 0.4 MG PO CAPS
0.4000 mg | ORAL_CAPSULE | Freq: Every day | ORAL | Status: DC
  Administered 2023-04-04: 0.4 mg via ORAL
  Filled 2023-04-03: qty 1

## 2023-04-03 SURGICAL SUPPLY — 91 items
ADH SKN CLS APL DERMABOND .7 (GAUZE/BANDAGES/DRESSINGS) ×1
APL PRP STRL LF DISP 70% ISPRP (MISCELLANEOUS) ×1
BAG COUNTER SPONGE SURGICOUNT (BAG) ×1 IMPLANT
BAG SPNG CNTER NS LX DISP (BAG)
BANDAGE ESMARK 6X9 LF (GAUZE/BANDAGES/DRESSINGS) ×1 IMPLANT
BIT DRILL INTERTAN LAG SCREW (BIT) IMPLANT
BIT DRILL SHORT 4.0 (BIT) IMPLANT
BLADE SURG 10 STRL SS (BLADE) ×2 IMPLANT
BNDG CMPR 5X6 CHSV STRCH STRL (GAUZE/BANDAGES/DRESSINGS)
BNDG CMPR 9X6 STRL LF SNTH (GAUZE/BANDAGES/DRESSINGS)
BNDG COHESIVE 4X5 TAN STRL (GAUZE/BANDAGES/DRESSINGS) ×1 IMPLANT
BNDG COHESIVE 6X5 TAN ST LF (GAUZE/BANDAGES/DRESSINGS) ×1 IMPLANT
BNDG ELASTIC 4X5.8 VLCR STR LF (GAUZE/BANDAGES/DRESSINGS) ×1 IMPLANT
BNDG ELASTIC 6X5.8 VLCR STR LF (GAUZE/BANDAGES/DRESSINGS) ×1 IMPLANT
BNDG ESMARK 6X9 LF (GAUZE/BANDAGES/DRESSINGS)
BNDG GAUZE DERMACEA FLUFF 4 (GAUZE/BANDAGES/DRESSINGS) ×2 IMPLANT
BNDG GZE DERMACEA 4 6PLY (GAUZE/BANDAGES/DRESSINGS)
BRUSH SCRUB EZ PLAIN DRY (MISCELLANEOUS) ×2 IMPLANT
CHLORAPREP W/TINT 26 (MISCELLANEOUS) ×1 IMPLANT
COVER SURGICAL LIGHT HANDLE (MISCELLANEOUS) ×2 IMPLANT
CUFF TOURN SGL QUICK 18X4 (TOURNIQUET CUFF) IMPLANT
CUFF TOURN SGL QUICK 24 (TOURNIQUET CUFF)
CUFF TOURN SGL QUICK 34 (TOURNIQUET CUFF)
CUFF TRNQT CYL 24X4X16.5-23 (TOURNIQUET CUFF) IMPLANT
CUFF TRNQT CYL 34X4.125X (TOURNIQUET CUFF) IMPLANT
DERMABOND ADVANCED .7 DNX12 (GAUZE/BANDAGES/DRESSINGS) IMPLANT
DRAPE C-ARM 35X43 STRL (DRAPES) ×1 IMPLANT
DRAPE C-ARM 42X72 X-RAY (DRAPES) IMPLANT
DRAPE C-ARMOR (DRAPES) ×1 IMPLANT
DRAPE HALF SHEET 40X57 (DRAPES) ×2 IMPLANT
DRAPE IMP U-DRAPE 54X76 (DRAPES) ×2 IMPLANT
DRAPE INCISE IOBAN 66X45 STRL (DRAPES) ×1 IMPLANT
DRAPE ORTHO SPLIT 77X108 STRL (DRAPES) ×2
DRAPE SURG 17X23 STRL (DRAPES) ×1 IMPLANT
DRAPE SURG ORHT 6 SPLT 77X108 (DRAPES) ×2 IMPLANT
DRAPE U-SHAPE 47X51 STRL (DRAPES) ×1 IMPLANT
DRESSING MEPILEX FLEX 4X4 (GAUZE/BANDAGES/DRESSINGS) ×3 IMPLANT
DRILL BIT SHORT 4.0 (BIT) ×2
DRSG ADAPTIC 3X8 NADH LF (GAUZE/BANDAGES/DRESSINGS) ×1 IMPLANT
DRSG MEPILEX FLEX 4X4 (GAUZE/BANDAGES/DRESSINGS) ×3
DRSG MEPILEX POST OP 4X8 (GAUZE/BANDAGES/DRESSINGS) ×1 IMPLANT
ELECT REM PT RETURN 9FT ADLT (ELECTROSURGICAL) ×1
ELECTRODE REM PT RTRN 9FT ADLT (ELECTROSURGICAL) ×1 IMPLANT
GAUZE SPONGE 4X4 12PLY STRL (GAUZE/BANDAGES/DRESSINGS) ×1 IMPLANT
GLOVE BIO SURGEON STRL SZ 6.5 (GLOVE) ×3 IMPLANT
GLOVE BIO SURGEON STRL SZ7.5 (GLOVE) ×4 IMPLANT
GLOVE BIOGEL PI IND STRL 6.5 (GLOVE) ×1 IMPLANT
GLOVE BIOGEL PI IND STRL 7.5 (GLOVE) ×1 IMPLANT
GOWN STRL REUS W/ TWL LRG LVL3 (GOWN DISPOSABLE) ×3 IMPLANT
GOWN STRL REUS W/ TWL XL LVL3 (GOWN DISPOSABLE) ×1 IMPLANT
GOWN STRL REUS W/TWL LRG LVL3 (GOWN DISPOSABLE) ×3
GOWN STRL REUS W/TWL XL LVL3 (GOWN DISPOSABLE) ×1
GUIDE PIN 3.2X343 (PIN) ×2
GUIDE PIN 3.2X343MM (PIN) ×2
GUIDE ROD 3.0 (MISCELLANEOUS) ×1
KIT BASIN OR (CUSTOM PROCEDURE TRAY) ×1 IMPLANT
KIT TURNOVER KIT B (KITS) ×1 IMPLANT
MANIFOLD NEPTUNE II (INSTRUMENTS) ×1 IMPLANT
NAIL IM CANN 11.5X380 130D (Nail) IMPLANT
NDL 22X1.5 STRL (OR ONLY) (MISCELLANEOUS) IMPLANT
NEEDLE 22X1.5 STRL (OR ONLY) (MISCELLANEOUS) IMPLANT
NS IRRIG 1000ML POUR BTL (IV SOLUTION) ×1 IMPLANT
PACK GENERAL/GYN (CUSTOM PROCEDURE TRAY) ×1 IMPLANT
PACK ORTHO EXTREMITY (CUSTOM PROCEDURE TRAY) ×1 IMPLANT
PAD ARMBOARD 7.5X6 YLW CONV (MISCELLANEOUS) ×2 IMPLANT
PADDING CAST COTTON 6X4 STRL (CAST SUPPLIES) ×3 IMPLANT
PIN GUIDE 3.2X343MM (PIN) IMPLANT
ROD GUIDE 3.0 (MISCELLANEOUS) IMPLANT
SCREW LAG COMPR KIT 100/95 (Screw) IMPLANT
SCREW TRIGEN LOW PROF 5.0X45 (Screw) IMPLANT
SPONGE T-LAP 18X18 ~~LOC~~+RFID (SPONGE) ×1 IMPLANT
STAPLER VISISTAT 35W (STAPLE) ×1 IMPLANT
STOCKINETTE IMPERVIOUS LG (DRAPES) ×1 IMPLANT
STRIP CLOSURE SKIN 1/2X4 (GAUZE/BANDAGES/DRESSINGS) IMPLANT
SUCTION TUBE FRAZIER 10FR DISP (MISCELLANEOUS) IMPLANT
SUT ETHILON 3 0 PS 1 (SUTURE) ×1 IMPLANT
SUT MNCRL AB 3-0 PS2 18 (SUTURE) ×1 IMPLANT
SUT MNCRL AB 3-0 PS2 27 (SUTURE) IMPLANT
SUT MON AB 2-0 CT1 36 (SUTURE) ×1 IMPLANT
SUT PDS AB 2-0 CT1 27 (SUTURE) IMPLANT
SUT VIC AB 0 CT1 27 (SUTURE)
SUT VIC AB 0 CT1 27XBRD ANBCTR (SUTURE) IMPLANT
SUT VIC AB 2-0 CT1 27 (SUTURE) ×2
SUT VIC AB 2-0 CT1 TAPERPNT 27 (SUTURE) ×2 IMPLANT
SYR CONTROL 10ML LL (SYRINGE) IMPLANT
TOWEL GREEN STERILE (TOWEL DISPOSABLE) ×2 IMPLANT
TOWEL GREEN STERILE FF (TOWEL DISPOSABLE) ×2 IMPLANT
TUBE CONNECTING 12X1/4 (SUCTIONS) ×1 IMPLANT
UNDERPAD 30X36 HEAVY ABSORB (UNDERPADS AND DIAPERS) ×1 IMPLANT
WATER STERILE IRR 1000ML POUR (IV SOLUTION) ×2 IMPLANT
YANKAUER SUCT BULB TIP NO VENT (SUCTIONS) ×1 IMPLANT

## 2023-04-03 NOTE — Anesthesia Preprocedure Evaluation (Addendum)
Anesthesia Evaluation  Patient identified by MRN, date of birth, ID band Patient awake    Reviewed: Allergy & Precautions, NPO status , Patient's Chart, lab work & pertinent test results  History of Anesthesia Complications Negative for: history of anesthetic complications  Airway Mallampati: III  TM Distance: >3 FB Neck ROM: Full    Dental  (+) Upper Dentures, Dental Advisory Given   Pulmonary shortness of breath, sleep apnea , Current Smoker and Patient abstained from smoking. Met lung cancer   breath sounds clear to auscultation       Cardiovascular hypertension, Pt. on medications and Pt. on home beta blockers + CAD, + Past MI and + Cardiac Stents   Rhythm:Regular  Chris [name] is a 62 y.o.  mildly overweight married Caucasian male, father of 1 son, grandfather 2 to grandchildren (twin boys), whom I saw the office 03/20/2022...  He works half-time doing reposition.  He has a history of CAD, status post acute inferior wall myocardial infarction with RV infarct physiology on April 17, 2006. I took him to the cath lab at midnight that day and opened up an occluded RCA with overlapping Cypher drug-eluting stents. Circumflex and LAD were free of significant disease. His EF was 40% to 45% with moderate inferior hypokinesia. Subsequent echocardiogram revealed normal LV function with borderline inferior hypokinesia, and a Myoview performed  was not ischemic. His other problems include hyperlipidemia and continued tobacco abuse of  1/2 pack/day recalcitrant to risk factor modification..   Neuro/Psych  PSYCHIATRIC DISORDERS Anxiety     negative neurological ROS     GI/Hepatic ,GERD  Medicated and Controlled,,Lab Results      Component                Value               Date                      ALT                      19                  03/07/2023                AST                      20                  03/07/2023                 ALKPHOS                  66                  03/07/2023                BILITOT                  0.7                 03/07/2023              Endo/Other  negative endocrine ROS    Renal/GU negative Renal ROS     Musculoskeletal  (+) Arthritis ,   Right femur nonunion   Abdominal   Peds  Hematology negative hematology ROS (+) Lab Results      Component  Value               Date                      WBC                      5.2                 03/07/2023                HGB                      15.5                03/07/2023                HCT                      45.9                03/07/2023                MCV                      87.6                03/07/2023                PLT                      217                 03/07/2023              Anesthesia Other Findings   Reproductive/Obstetrics                              Anesthesia Physical Anesthesia Plan  ASA: 3  Anesthesia Plan: General   Post-op Pain Management: Ofirmev IV (intra-op)*   Induction: Intravenous  PONV Risk Score and Plan: 1 and Ondansetron and Dexamethasone  Airway Management Planned: Oral ETT  Additional Equipment: None  Intra-op Plan:   Post-operative Plan: Extubation in OR  Informed Consent: I have reviewed the patients History and Physical, chart, labs and discussed the procedure including the risks, benefits and alternatives for the proposed anesthesia with the patient or authorized representative who has indicated his/her understanding and acceptance.     Dental advisory given  Plan Discussed with: CRNA  Anesthesia Plan Comments:         Anesthesia Quick Evaluation

## 2023-04-03 NOTE — Op Note (Signed)
Orthopaedic Surgery Operative Note (CSN: [id number] ) Date of Surgery: 04/03/2023  Admit Date: 04/03/2023   Diagnoses: Pre-Op Diagnoses: Right subtrochanteric femoral nonunion Right femur hardware failure  Post-Op Diagnosis: Same  Procedures: CPT 27470-Repair of subtrochanteric nonunion right femur CPT 20680-Removal of right femoral hardware  Surgeons : Primary: Roby Lofts, MD  Assistant: Ulyses Southward, PA-C  Location: OR 3   Anesthesia: General   Antibiotics: Ancef 2g preop   Tourniquet time: None    Estimated Blood Loss: 250 mL  Complications: None  Specimens: ID Type Source Tests Collected by Time Destination  1 : Right Femur Nonunion Tissue Soft Tissue, Other SURGICAL PATHOLOGY Roby Lofts, MD 04/03/2023 1116      Implants: Implant Name Type Inv. Item Serial No. Manufacturer Lot No. LRB No. Used Action  NAIL IM CANN 11.5X380 130D - [id number] Nail NAIL IM CANN 11.5X380 130D  [employer] [id number] Right 1 Implanted  SCREW LAG COMPR KIT 100/95 - [id number] Screw SCREW LAG COMPR KIT 100/95  [employer] [id number] Right 1 Implanted  SCREW TRIGEN LOW PROF 5.0X45 - [id number] Screw SCREW TRIGEN LOW PROF 5.0X45  [employer] [id number] Right 1 Implanted  SCREW TRIGEN LOW PROF 5.0X45 - [id number] Screw SCREW TRIGEN LOW PROF 5.0X45  [employer] [id number] Right 1 Implanted     Indications for Surgery: 62 year old male who has metastatic lung cancer who had an area of what was radiated in his proximal femur on the right side.  He sustained a stress fracture that underwent prophylactic nailing.  He continued to have severe pain after this procedure and eventually was found to have failed fixation with a displaced subtrochanteric femur nonunion.  Due to the unstable nature of his injury I recommend proceeding with removal of hardware and revision fixation and nonunion repair.  Risk and benefits were discussed  with the patient and his wife.  Risks include but not limited to bleeding, infection, persistent nonunion, continued pain, nerve or blood vessel injury, DVT, hardware failure, hardware irritation, even the possibility anesthetic complications.  He agreed to proceed with surgery consent was obtained.  Operative Findings: 1.  Removal of previous fractured Synthes TFNA without any of complications. 2.  Revision cephalomedullary nailing of right subtrochanteric nonunion with using [employer] 11.5 x 380 mm InterTAN nail with 100 mm lag screw.  Procedure: The patient was identified in the preoperative holding area. Consent was confirmed with the patient and their family and all questions were answered. The operative extremity was marked after confirmation with the patient. he was then brought back to the operating room by our anesthesia colleagues.  He was carefully transferred over to radiolucent flattop table.  He was placed under general anesthetic.  The right lower extremity was then prepped and draped in usual sterile fashion.  A timeout was performed to verify the patient, the procedure, and the extremity.  Preoperative antibiotics were dosed.  Fluoroscopic imaging showed the unstable nature of his nonunion.  Through the lateral incision for the lag screw I reopened this and used the lag screw removal screwdriver to thread the send and was able to remove the lag screw without difficulty.  I then reopened the proximal incision and found the cannulation for the intramedullary nail.  I then used a conical extraction device to thread in the proximal fragment of the nail and I was able to remove this without any difficulty.  I then used a Synthes threaded screw coupler to  thread into the distal portion of the nail and once I had the device threaded into the nail I then removed the distal interlocking screw and was able to remove the distal fracture portion without any difficulty.  Traction was then applied  by my assistant and a ball-tipped guidewire was sent on the center of the canal.  I did use a pituitary rondure to grab soft tissue that was located in the site of the nonunion through the proximal incision and sent this off for specimen for evaluation for metastases.  I then measured the length of the nail and chose to use a 380 mm nail.  I then reamed from 11 mm to 13 mm and decided to place an 11.5 mm nail.  This was passed down the center of the canal and seated and into the distal metaphysis.  I then used the targeting arm and I proceeded to place the antirotation bar through the previous tract of the lag screw for the Synthes screw.  I then placed a threaded guidewire and measured 100 mm and used this for the lag screw.  As the guidewire was following the path of the previous lag screw I decided to drill without a guidewire and then placed the 100 mm lag screw.  I then placed the 95 mm compression screw and compressed through the device approximately 3 mm.  I then forward slapped the nail to compress along the nonunion site.  I then used perfect circle technique to place 2 lateral to medial distal locking screws distally.  Final fluoroscopic imaging was obtained.  The incisions were copiously irrigated.  Layered closure with 2-0 Vicryl and 3-0 Monocryl with Dermabond was used to close the skin.  Sterile dressings were applied.  The patient was then awoke from anesthesia and taken to the PACU in stable condition.  Post Op Plan/Instructions: Patient be touchdown weightbearing to the right lower extremity.  He will receive Lovenox for DVT prophylaxis and transition to aspirin and Plavix postop.  We will have him mobilize with physical and Occupational Therapy.  He will also receive postoperative Ancef for surgical prophylaxis.  I was present and performed the entire surgery.  Ulyses Southward, PA-C did assist me throughout the case. An assistant was necessary given the difficulty in approach, maintenance of  reduction and ability to instrument the fracture.   Truitt Merle, MD [facility]

## 2023-04-03 NOTE — H&P (Signed)
Orthopaedic Trauma Service (OTS) H&P   Patient ID: Chris [name] MRN: [id number] DOB/AGE: 1960-12-16 62 y.o.  Reason for Surgery: Right proximal femur nonunion and failed fixation  HPI: Chris [name] is an 62 y.o. male who presents for revision fixation of his proximal femur status post failed fixation and femoral nonunion.  Patient has a complicated history for his right upper extremity.  He had a previous history of metastatic lesion to the femur for which she underwent radiation.  He eventually developed significant pain in the proximal femur was found to have a stress fracture in that region.  He underwent prophylactic nailing with orthopedic oncologist at [facility].  There is question whether the fracture propagated and completed prior to stabilization.  However the patient continued to have severe pain postoperatively.  He was seen in April which x-rays have been reviewed which showed persistent fracture with no visible hardware failure from the x-rays that I reviewed.  They pursued second opinion with Dr. Steward Drone and x-rays showed displacement of the nonunion with broken hardware of the nail.  He was referred to me as it was felt to be outside of his scope of practice and required treatment by orthopedic traumatologist with familiarity with revision fixation along these lines.  Patient was seen in the office in full discussion was had regarding treatment options.  I do not feel is reasonable to pursue nonoperative management as he has a varus angulated nonunion with failed fixation and he is currently wheelchair-bound.  Patient is on immunotherapy but has not had any doses since April.  Patient continues to work.  He enjoys riding his motorcycle.  Past Medical History:  Diagnosis Date   Anxiety    associated with medical care,  worsened by difficulty hearing, does better with wife present   Arthritis    Cancer associated pain    Cancer, metastatic to liver [facility])    Complication  of anesthesia    wife states very anxious may need pre sedation   Coronary artery disease    Dyspnea    GERD (gastroesophageal reflux disease)    Hearing loss    mild per wife   Hearing loss    no hearing aids per wife   Hyperlipidemia    Hypertension    MI (myocardial infarction) (HCC) 04/17/2006   Acute inferolateral wall MI with cardiogenic shock and complete heart block   Primary adenocarcinoma of upper lobe of right lung (HCC)    Prostate cancer (HCC)    Sleep apnea    Tobacco abuse    Wears glasses    Wears partial dentures    Upper    Past Surgical History:  Procedure Laterality Date   BRONCHIAL BIOPSY  10/09/2021   Procedure: BRONCHIAL BIOPSIES;  Surgeon: Leslye Peer, MD;  Location: [facility] ENDOSCOPY;  Service: Pulmonary;;   BRONCHIAL BRUSHINGS  10/09/2021   Procedure: BRONCHIAL BRUSHINGS;  Surgeon: Leslye Peer, MD;  Location: [facility] ENDOSCOPY;  Service: Pulmonary;;   BRONCHIAL NEEDLE ASPIRATION BIOPSY  10/09/2021   Procedure: BRONCHIAL NEEDLE ASPIRATION BIOPSIES;  Surgeon: Leslye Peer, MD;  Location: [facility] ENDOSCOPY;  Service: Pulmonary;;   CARDIAC CATHETERIZATION  2007   left, RCA 100% occluded ruptured plaque with thrombus in the proximal segment   CYST EXCISION N/A 08/30/2021   Procedure: EXCISION OF POSTERIOR SCALP CYST;  Surgeon: Berna Bue, MD;  Location: [facility] ORS;  Service: General;  Laterality: N/A;   CYSTOSCOPY N/A 07/17/2018   Procedure: CYSTOSCOPY;  Surgeon:  Marcine Matar, MD;  Location[facility];  Service: Urology;  Laterality: N/A;  no seeds in bladder per Dr Retta Diones   FIDUCIAL MARKER PLACEMENT  10/09/2021   Procedure: FIDUCIAL MARKER PLACEMENT;  Surgeon: Leslye Peer, MD;  Location: [facility] ENDOSCOPY;  Service: Pulmonary;;   HAND SURGERY Right    has metal plate in arm   PROSTATE BIOPSY     RADIOACTIVE SEED IMPLANT N/A 07/17/2018   Procedure: RADIOACTIVE SEED IMPLANT/BRACHYTHERAPY IMPLANT;  Surgeon: Marcine Matar, MD;   Location: [facility];  Service: Urology;  Laterality: N/A;  77 seeds   SPACE OAR INSTILLATION N/A 07/17/2018   Procedure: SPACE OAR INSTILLATION;  Surgeon: Marcine Matar, MD;  Location: [facility];  Service: Urology;  Laterality: N/A;   VIDEO BRONCHOSCOPY WITH RADIAL ENDOBRONCHIAL ULTRASOUND  10/09/2021   Procedure: VIDEO BRONCHOSCOPY WITH RADIAL ENDOBRONCHIAL ULTRASOUND;  Surgeon: Leslye Peer, MD;  Location: [facility] ENDOSCOPY;  Service: Pulmonary;;   WRIST SURGERY  10/04/2012    Family History  Problem Relation Age of Onset   Breast cancer Mother    Prostate cancer Neg Hx    Kidney cancer Neg Hx    Cancer Neg Hx     Social History:  reports that he has been smoking cigarettes. He has a 31.50 pack-year smoking history. He has never used smokeless tobacco. He reports that he does not drink alcohol and does not use drugs.  Allergies: No Known Allergies  Medications:  No current facility-administered medications on file prior to encounter.   Current Outpatient Medications on File Prior to Encounter  Medication Sig Dispense Refill   ALPRAZolam (XANAX) 0.25 MG tablet Take 1 tablet (0.25 mg total) by mouth at bedtime. (Patient taking differently: Take 0.25 mg by mouth at bedtime as needed for anxiety.) 15 tablet 0   atorvastatin (LIPITOR) 20 MG tablet Take 1 tablet (20 mg total) by mouth daily. 90 tablet 3   clopidogrel (PLAVIX) 75 MG tablet TAKE 1 TABLET BY MOUTH EVERY DAY 90 tablet 2   fluticasone (FLONASE) 50 MCG/ACT nasal spray Place 1 spray into both nostrils daily as needed for allergies.     ketoconazole (NIZORAL) 2 % cream Apply 1 application  topically 2 (two) times daily as needed for irritation.     metoprolol tartrate (LOPRESSOR) 25 MG tablet TAKE 1 TABLET (25 MG TOTAL) BY MOUTH DAILY. 90 tablet 2   oxyCODONE ER (XTAMPZA ER) 9 MG C12A Take 1 capsule by mouth every 12 (twelve) hours. 30 capsule 0   Oxycodone HCl 10 MG TABS Take 1 tablet (10 mg  total) by mouth every 4 (four) hours as needed (pain). (Patient taking differently: Take 10 mg by mouth every 4 (four) hours.) 60 tablet 0   sertraline (ZOLOFT) 100 MG tablet TAKE 1 TABLET BY MOUTH EVERY DAY (Patient taking differently: Take 50 mg by mouth daily.) 90 tablet 0   tamsulosin (FLOMAX) 0.4 MG CAPS capsule Take 1 capsule (0.4 mg total) by mouth daily. 30 capsule 3   Vitamin D, Ergocalciferol, (DRISDOL) 1.25 MG (50000 UNIT) CAPS capsule TAKE 1 CAPSULE BY MOUTH ONE TIME PER WEEK 12 capsule 1   pantoprazole (PROTONIX) 40 MG tablet Take 1 tablet (40 mg total) by mouth daily. (Patient not taking: Reported on 04/01/2023) 60 tablet 1     ROS: Constitutional: No fever or chills Vision: No changes in vision ENT: No difficulty swallowing CV: No chest pain Pulm: No SOB or wheezing GI: No nausea or vomiting GU:  No urgency or inability to hold urine Skin: No poor wound healing Neurologic: No numbness or tingling Psychiatric: No depression or anxiety Heme: No bruising Allergic: No reaction to medications or food   Exam: Blood pressure (!) 158/91, pulse 66, temperature 97.8 F (36.6 C), temperature source Oral, resp. rate 18, height 5\' 11"  (1.803 m), weight 109.8 kg, SpO2 96 %. General: No acute distress Orientation: Awake alert and oriented x 3 Mood and Affect: Cooperative and pleasant Gait: Unable to stand or walk secondary to pain and discomfort.  Cannot even raise his leg. Coordination and balance: Within normal limits otherwise  Injured Extremity (CV, lymph, sensation, reflexes): Right lower extremity: Previous surgical incisions are healed.  No obvious deformity or significant skin lesions.  The patient is have difficult time raising his leg or extending his knee secondary to pain and discomfort.  He is has a warm well-perfused foot brisk cap refill less than 2 seconds endorses gross sensation to his foot and ankle.  Left lower extremity: Skin without lesions. No tenderness to  palpation. Full painless ROM, full strength in each muscle groups without evidence of instability.   Medical Decision Making: Data: Imaging: X-rays have been reviewed which showed a varus nonunion to the subtrochanteric region with failed and broken nail fixation.  Labs: No results found for this or any previous visit (from the past 24 hour(s)).   Imaging or Labs ordered: None  Medical history and chart was reviewed and case discussed with medical provider.  Assessment/Plan: 62 year old male with a right subtrochanteric varus nonunion with failed fixation.  I discussed with him and his wife the procedure that would be required including removal of previous hardware with placement of new nail.  I discussed with them that I would likely not open up the site to bone graft as I would be concerned about stripping more of the blood supply which is already tenuous secondary to the radiation therapy.  I did discuss with him there is a possibility that this is occurring again.  However the hardware appears to have failed relatively early.  We have provided prescription for a bone stimulator which we can hopefully institute in addition to the revision fixation.  I will limit his weightbearing for likely 4 to 6 weeks to make sure that we can get some healing prior to initiation weightbearing.  I will likely also take a biopsy of the intramedullary contents to make sure that there is no signs of any metastatic disease.  Other risks included continued failed fixation, pain, nerve or blood vessel injury, DVT, even the possibility anesthetic complications.  He agrees to proceed with surgery.  Roby Lofts, MD [facility] [phone] (office) [email or web address]

## 2023-04-03 NOTE — Anesthesia Procedure Notes (Signed)
Procedure Name: Intubation Date/Time: 04/03/2023 10:41 AM  Performed by: Lovie Chol, CRNAPre-anesthesia Checklist: Patient identified, Emergency Drugs available, Suction available and Patient being monitored Patient Re-evaluated:Patient Re-evaluated prior to induction Oxygen Delivery Method: Circle System Utilized Preoxygenation: Pre-oxygenation with 100% oxygen Induction Type: IV induction Ventilation: Mask ventilation without difficulty and Oral airway inserted - appropriate to patient size Laryngoscope Size: Hyacinth Meeker and 3 Grade View: Grade I Tube type: Oral Tube size: 7.5 mm Number of attempts: 1 Airway Equipment and Method: Stylet and Oral airway Placement Confirmation: ETT inserted through vocal cords under direct vision, positive ETCO2 and breath sounds checked- equal and bilateral Secured at: 22 cm Tube secured with: Tape Dental Injury: Teeth and Oropharynx as per pre-operative assessment

## 2023-04-03 NOTE — Progress Notes (Signed)
Orthopedic Tech Progress Note Patient Details:  Chris [name] [birth year] [id number] Overhead frame applied to patient's bed Patient ID: Chris [name], male   DOB: [birth year], 62 y.o.   MRN: [id number]  [name] 04/03/2023, 7:13 PM

## 2023-04-03 NOTE — Progress Notes (Signed)
PT Cancellation Note  Patient Details Name: Chris [name] MRN: [id number] DOB: 05-04-1961   Cancelled Treatment:    Reason Eval/Treat Not Completed: Fatigue/lethargy limiting ability to participate; checked on pt this pm, he c/o fatigue, just up from surgery and wished to wait till early AM to complete PT prior to going home.  PT will follow up.    [name] 04/03/2023, 3:35 PM Sheran Lawless, PT Acute Rehabilitation Services Office:[phone] 04/03/2023

## 2023-04-03 NOTE — Transfer of Care (Signed)
Immediate Anesthesia Transfer of Care Note  Patient: Chris [name]  Procedure(s) Performed: REVISION FIXATION OF RIGHT FEMUR NONUNION (Right: Leg Upper) REMOVAL OF PREVIOUS HARDWARE FEMUR (Right: Leg Upper)  Patient Location: PACU  Anesthesia Type:General  Level of Consciousness: oriented, drowsy, and patient cooperative  Airway & Oxygen Therapy: Patient Spontanous Breathing and Patient connected to face mask oxygen  Post-op Assessment: Report given to RN and Post -op Vital signs reviewed and stable  Post vital signs: Reviewed  Last Vitals:  Vitals Value Taken Time  BP 131/71 04/03/23 1226  Temp    Pulse 72 04/03/23 1229  Resp 17 04/03/23 1229  SpO2 96 % 04/03/23 1229  Vitals shown include unvalidated device data.  Last Pain:  Vitals:   04/03/23 0851  TempSrc:   PainSc: 0-No pain         Complications: No notable events documented.

## 2023-04-04 ENCOUNTER — Other Ambulatory Visit (HOSPITAL_COMMUNITY): Payer: Self-pay

## 2023-04-04 ENCOUNTER — Encounter (HOSPITAL_COMMUNITY): Payer: Self-pay | Admitting: Student

## 2023-04-04 DIAGNOSIS — T84114A Breakdown (mechanical) of internal fixation device of right femur, initial encounter: Secondary | ICD-10-CM | POA: Diagnosis not present

## 2023-04-04 DIAGNOSIS — S7221XK Displaced subtrochanteric fracture of right femur, subsequent encounter for closed fracture with nonunion: Secondary | ICD-10-CM | POA: Diagnosis not present

## 2023-04-04 DIAGNOSIS — I2510 Atherosclerotic heart disease of native coronary artery without angina pectoris: Secondary | ICD-10-CM | POA: Diagnosis not present

## 2023-04-04 DIAGNOSIS — F17210 Nicotine dependence, cigarettes, uncomplicated: Secondary | ICD-10-CM | POA: Diagnosis not present

## 2023-04-04 DIAGNOSIS — Z8546 Personal history of malignant neoplasm of prostate: Secondary | ICD-10-CM | POA: Diagnosis not present

## 2023-04-04 DIAGNOSIS — Y792 Prosthetic and other implants, materials and accessory orthopedic devices associated with adverse incidents: Secondary | ICD-10-CM | POA: Diagnosis not present

## 2023-04-04 DIAGNOSIS — Z7902 Long term (current) use of antithrombotics/antiplatelets: Secondary | ICD-10-CM | POA: Diagnosis not present

## 2023-04-04 DIAGNOSIS — I10 Essential (primary) hypertension: Secondary | ICD-10-CM | POA: Diagnosis not present

## 2023-04-04 LAB — CBC
HCT: 42.4 % (ref 39.0–52.0)
Hemoglobin: 14 g/dL (ref 13.0–17.0)
MCH: 29.9 pg (ref 26.0–34.0)
MCHC: 33 g/dL (ref 30.0–36.0)
MCV: 90.6 fL (ref 80.0–100.0)
Platelets: 230 10*3/uL (ref 150–400)
RBC: 4.68 MIL/uL (ref 4.22–5.81)
RDW: 14.3 % (ref 11.5–15.5)
WBC: 13.6 10*3/uL — ABNORMAL HIGH (ref 4.0–10.5)
nRBC: 0 % (ref 0.0–0.2)

## 2023-04-04 LAB — BASIC METABOLIC PANEL
Anion gap: 9 (ref 5–15)
BUN: 18 mg/dL (ref 8–23)
CO2: 26 mmol/L (ref 22–32)
Calcium: 9 mg/dL (ref 8.9–10.3)
Chloride: 103 mmol/L (ref 98–111)
Creatinine, Ser: 1.17 mg/dL (ref 0.61–1.24)
GFR, Estimated: >60 mL/min (ref >60)
Glucose, Bld: 135 mg/dL — ABNORMAL HIGH (ref 70–99)
Potassium: 4.3 mmol/L (ref 3.5–5.1)
Sodium: 138 mmol/L (ref 135–145)

## 2023-04-04 LAB — VITAMIN D 25 HYDROXY (VIT D DEFICIENCY, FRACTURES): Vit D, 25-Hydroxy: 56.46 ng/mL (ref 30–100)

## 2023-04-04 LAB — GLUCOSE, CAPILLARY: Glucose-Capillary: 132 mg/dL — ABNORMAL HIGH (ref 70–99)

## 2023-04-04 MED ORDER — OXYCODONE HCL 15 MG PO TABS
15.0000 mg | ORAL_TABLET | ORAL | 0 refills | Status: DC | PRN
  Filled 2023-04-04: qty 42, 7d supply, fill #0

## 2023-04-04 MED ORDER — ASPIRIN 81 MG PO TBEC
81.0000 mg | DELAYED_RELEASE_TABLET | Freq: Every day | ORAL | 0 refills | Status: AC
Start: 2023-04-04 — End: 2023-05-04
  Filled 2023-04-04: qty 30, 30d supply, fill #0

## 2023-04-04 NOTE — Evaluation (Signed)
Physical Therapy Evaluation and Discharge Patient Details Name: Chris [name] MRN: [id number] DOB: 09-Apr-1961 Today's Date: 04/04/2023  History of Present Illness  62 y.o. male who presents for revision fixation of his proximal femur status post failed fixation and femoral nonunion. 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 PMH of GERD, dypsnea, arthritis, anxiety, cancer, HTN.  Clinical Impression  Patient evaluated by Physical Therapy with no further acute PT needs identified. All education has been completed and the patient has no further questions. Safely navigates steps with TDWB on Rt, wife present and participated in guarding with walker block and posterior technique. Pt states he feels confident with technique. No physical assist required with transfer and gait while using 2WW. Stable with AD and safely maintains WB precautions. Reviewed LE exercises, precautions, and pt feels confident with mobility. See below for any follow-up Physical Therapy or equipment needs. PT is signing off. Thank you for this referral.        Recommendations for follow up therapy are one component of a multi-disciplinary discharge planning process, led by the attending physician.  Recommendations may be updated based on patient status, additional functional criteria and insurance authorization.     Assistance Recommended at Discharge Set up Supervision/Assistance  Patient can return home with the following  A little help with bathing/dressing/bathroom;Assist for transportation;Help with stairs or ramp for entrance    Equipment Recommendations None recommended by PT  Recommendations for Other Services       Functional Status Assessment Patient has had a recent decline in their functional status and demonstrates the ability to make significant improvements in function in a reasonable and predictable amount of time.     Precautions / Restrictions Precautions Precautions:  Fall Restrictions Weight Bearing Restrictions: Yes RLE Weight Bearing: Touchdown weight bearing      Mobility  Bed Mobility               General bed mobility comments: in recliner    Transfers Overall transfer level: Needs assistance Equipment used: Rolling walker (2 wheels) Transfers: Sit to/from Stand Sit to Stand: Supervision, Min assist           General transfer comment: Supervision for safety cues for hand placement, WB precautions. Performed from standard chair and recliner. No physical assist needed    Ambulation/Gait Ambulation/Gait assistance: Supervision Gait Distance (Feet): 60 Feet Assistive device: Rolling walker (2 wheels) Gait Pattern/deviations: Step-to pattern, Decreased stride length Gait velocity: decr Gait velocity interpretation: <1.31 ft/sec, indicative of household ambulator   General Gait Details: Educated on safe AD use with RW for support, cues for safety and sequencing. Safely maintains TDWB on RLE. UEs fatigue relatively quickly but shows good awareness of when he needs to sit and rest. No overt LOB noted.  Stairs Stairs: Yes Stairs assistance: Min guard Stair Management: No rails, Step to pattern, Backwards, With walker Number of Stairs: 4 General stair comments: Pt and wife present and participated in training. Min guard for wife to block RW with posterior approach. Teach back method for recall of sequencing. Safely maintains TDWB on Rt, with smooth transition between steps similar to home set-up. Feels confident with task, denies further training.  Wheelchair Mobility    Modified Rankin (Stroke Patients Only)       Balance Overall balance assessment: Needs assistance Sitting-balance support: Feet supported Sitting balance-Leahy Scale: Normal       Standing balance-Leahy Scale: Poor Standing balance comment: single UE for support  Pertinent Vitals/Pain Pain Assessment Pain  Assessment: 0-10 Pain Score: 4  Pain Location: Rt thigh Pain Descriptors / Indicators: Aching Pain Intervention(s): Monitored during session, Repositioned, Limited activity within patient's tolerance    Home Living Family/patient expects to be discharged to:: Private residence Living Arrangements: Spouse/significant other Available Help at Discharge: Family;Available PRN/intermittently (wife works from 8-5pm) Type of Home: House Home Access: Stairs to enter Entrance Stairs-Rails: Acupuncturist of Steps: 4   Home Layout: One level Home Equipment: Shower seat - built in;Grab bars - tub/shower;Cane - quad;Rollator (4 wheels);Grab bars - toilet;Wheelchair - Forensic psychologist (2 wheels) 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 RLE: Unable to fully assess due to immobilization (WB precautions)    Cervical / Trunk Assessment Cervical / Trunk Assessment: Normal  Communication   Communication: No difficulties  Cognition Arousal/Alertness: Awake/alert Behavior During Therapy: WFL for tasks assessed/performed Overall Cognitive Status: Within Functional Limits for tasks assessed                                          General Comments General comments (skin integrity, edema, etc.): Tolerated well, eager to d/c with wife.    Exercises General Exercises - Lower Extremity Ankle Circles/Pumps: AROM, Both, 10 reps, Seated Quad Sets:  Strengthening, Both, 10 reps, Seated Gluteal Sets: Strengthening, Both, 10 reps, Seated   Assessment/Plan    PT Assessment Patient does not need any further PT services  PT Problem List         PT Treatment Interventions      PT Goals (Current goals can be found in the Care Plan section)  Acute Rehab PT Goals Patient Stated Goal: go home soon PT Goal Formulation: All assessment and education complete, DC therapy    Frequency       Co-evaluation               AM-PAC PT "6 Clicks" Mobility  Outcome Measure Help needed turning from your back to your side while in a flat bed without using bedrails?: A Little Help needed moving from lying on your back to sitting on the side of a flat bed without using bedrails?: A Little Help needed moving to and from a bed to a chair (including a wheelchair)?: A Little Help needed standing up from a chair using your arms (e.g., wheelchair or bedside chair)?: A Little Help needed to walk in hospital room?: A Little Help needed climbing 3-5 steps with a railing? : A Little 6 Click Score: 18    End of Session Equipment Utilized During Treatment: Gait belt Activity Tolerance: Patient tolerated treatment well Patient left: in chair;with call bell/phone within reach;with family/visitor present Nurse Communication:  (Completed PT) PT Visit Diagnosis: Other abnormalities of gait and mobility (R26.89);Unsteadiness on feet (R26.81);Muscle weakness (generalized) (M62.81);Difficulty in walking, not elsewhere classified (R26.2);Pain Pain - Right/Left: Right Pain - part of  body: Hip    Time: 9629-5284 PT Time Calculation (min) (ACUTE ONLY): 25 min   Charges:   PT Evaluation $PT Eval Low Complexity: 1 Low PT Treatments $Gait Training: 8-22 mins        Kathlyn Sacramento, PT, DPT [facility]  Rehabilitation Services Physical Therapist Office: [phone] Website: [email or web address]   Berton Mount 04/04/2023, 11:19 AM

## 2023-04-04 NOTE — TOC Transition Note (Signed)
Transition of Care [facility]) - CM/SW Discharge Note   Patient Details  Name: Chris [name] MRN: [id number] Date of Birth: 06-Oct-1961  Transition of Care [facility]) CM/SW Contact:  Epifanio Lesches, RN Phone Number: 04/04/2023, 10:49 AM   Clinical Narrative:    Patient will DC to: home Anticipated DC date: 04/04/2023 Family notified:yes Transport by: car     S/P repair of subtrochanteric nonunion right femur and removal of right femoral hardware, 6/5 Per MD patient ready for DC today . RN, patient, and patient's family notified of DC plan.Orders noted for home health services. Pt agreeable. Preference: [facility]. Referral made with Morrie Sheldon / [facility] and accepted, Pt without DME needs. Wife to provide transportation to home. Pt without RX med concerns.  Post hospital f/u noted on AVS.  RNCM will sign off for now as intervention is no longer needed. Please consult [place] again if new needs arise.    Final next level of care: Home w Home Health Services Barriers to Discharge: No Barriers Identified   Patient Goals and CMS Choice   Choice offered to / list presented to : Patient  Discharge Placement                         Discharge Plan and Services Additional resources added to the After Visit Summary for                            [facility] Arranged: PT HH Agency: [facility] Date HH Agency Contacted: 04/04/23 Time HH Agency Contacted: 1049 Representative spoke with at [facility] Agency: Morrie Sheldon  Social Determinants of Health (SDOH) Interventions SDOH Screenings   Food Insecurity: No Food Insecurity (04/03/2023)  Housing: Low Risk  (04/03/2023)  Transportation Needs: No Transportation Needs (04/03/2023)  Utilities: Not At Risk (04/03/2023)  Tobacco Use: High Risk (04/04/2023)     Readmission Risk Interventions     No data to display

## 2023-04-04 NOTE — Anesthesia Postprocedure Evaluation (Signed)
Anesthesia Post Note  Patient: Chris [name]  Procedure(s) Performed: REVISION FIXATION OF RIGHT FEMUR NONUNION (Right: Leg Upper) REMOVAL OF PREVIOUS HARDWARE FEMUR (Right: Leg Upper)     Patient location during evaluation: PACU Anesthesia Type: General Level of consciousness: awake and alert Pain management: pain level controlled Vital Signs Assessment: post-procedure vital signs reviewed and stable Respiratory status: spontaneous breathing, nonlabored ventilation and respiratory function stable Cardiovascular status: blood pressure returned to baseline and stable Postop Assessment: no apparent nausea or vomiting Anesthetic complications: no   No notable events documented.  Last Vitals:  Vitals:   04/04/23 0423 04/04/23 0738  BP: (!) 159/77 (!) 152/82  Pulse: 82 86  Resp: 20 18  Temp: 37.1 C 36.7 C  SpO2: 100% 92%    Last Pain:  Vitals:   04/04/23 0730  TempSrc:   PainSc: Asleep                 Kincaid Tiger

## 2023-04-04 NOTE — Discharge Summary (Signed)
Orthopaedic Trauma Service (OTS) Discharge Summary   Patient ID: Chris [name] MRN: [id number] DOB/AGE: [birth year] 62 y.o.  Admit date: 04/03/2023 Discharge date: 04/04/2023  Admission Diagnoses: right intertrochanteric femur fracture with subsequent nonunion  Discharge Diagnoses:  Principal Problem:   Displaced intertrochanteric fracture of right femur, subsequent encounter for closed fracture with nonunion   Past Medical History:  Diagnosis Date   Anxiety    associated with medical care,  worsened by difficulty hearing, does better with wife present   Arthritis    Cancer associated pain    Cancer, metastatic to liver (HCC)    Complication of anesthesia    wife states very anxious may need pre sedation   Coronary artery disease    Dyspnea    GERD (gastroesophageal reflux disease)    Hearing loss    mild per wife   Hearing loss    no hearing aids per wife   Hyperlipidemia    Hypertension    MI (myocardial infarction) (HCC) 04/17/2006   Acute inferolateral wall MI with cardiogenic shock and complete heart block   Primary adenocarcinoma of upper lobe of right lung (HCC)    Prostate cancer (HCC)    Sleep apnea    Tobacco abuse    Wears glasses    Wears partial dentures    Upper     Procedures Performed:  CPT 27470-Repair of subtrochanteric nonunion right femur CPT 20680-Removal of right femoral hardware  Discharged Condition: good/stable  Hospital Course: Patient presented to [facility] on 04/03/2023 for scheduled procedure right lower extremity.  Was taken to the operating room by Dr. Jena Gauss for the above procedure, tolerated this well without complications.  Was admitted to the orthopedic service postoperatively for pain control and therapies.  Began working with physical and Occupational Therapy starting on postoperative day #1.  Was started on Lovenox for DVT prophylaxis starting on postoperative day #1. On 04/04/2023, the patient was tolerating  diet, working well with therapies, pain well controlled, vital signs stable, dressings clean, dry, intact and felt stable for discharge to home. Patient will follow up as below and knows to call with questions or concerns.     Consults: None  Significant Diagnostic Studies:   Results for orders placed or performed during the hospital encounter of 04/03/23 (from the past 168 hour(s))  CBC   Collection Time: 04/03/23  3:52 PM  Result Value Ref Range   WBC 10.5 4.0 - 10.5 K/uL   RBC 4.75 4.22 - 5.81 MIL/uL   Hemoglobin 14.1 13.0 - 17.0 g/dL   HCT 40.9 81.1 - 91.4 %   MCV 89.1 80.0 - 100.0 fL   MCH 29.7 26.0 - 34.0 pg   MCHC 33.3 30.0 - 36.0 g/dL   RDW 78.2 95.6 - 21.3 %   Platelets 219 150 - 400 K/uL   nRBC 0.0 0.0 - 0.2 %  Creatinine, serum   Collection Time: 04/03/23  3:52 PM  Result Value Ref Range   Creatinine, Ser 1.01 0.61 - 1.24 mg/dL   GFR, Estimated >08 >65 mL/min  Glucose, capillary   Collection Time: 04/04/23  7:34 AM  Result Value Ref Range   Glucose-Capillary 132 (H) 70 - 99 mg/dL     Treatments: IV hydration, antibiotics: Ancef, analgesia: acetaminophen, Dilaudid, oxycodone IR and oxycodone ER, cardiac meds: metoprolol, anticoagulation: LMW heparin, therapies: PT and OT, and surgery: As above  Discharge Exam: General: Sitting up in bed comfortably, no acute distress Respiratory: No increased work of  breathing at rest Right lower extremity: Dressings clean, dry, intact.  Mildly tender throughout the thigh as expected.  No significant calf tenderness.  Ankle DF/PF intact.  Endorses sensation to light touch over all aspects of the foot.  Neurovascularly intact.  Disposition: Discharge disposition: 01-Home or Self Care       Discharge Instructions     Call MD / Call 911   Complete by: As directed    If you experience chest pain or shortness of breath, CALL 911 and be transported to the hospital emergency room.  If you develope a fever above 101 F, pus (white  drainage) or increased drainage or redness at the wound, or calf pain, call your surgeon's office.   Constipation Prevention   Complete by: As directed    Drink plenty of fluids.  Prune juice may be helpful.  You may use a stool softener, such as Colace (over the counter) 100 mg twice a day.  Use MiraLax (over the counter) for constipation as needed.   Diet - low sodium heart healthy   Complete by: As directed    Increase activity slowly as tolerated   Complete by: As directed    Post-operative opioid taper instructions:   Complete by: As directed    POST-OPERATIVE OPIOID TAPER INSTRUCTIONS: It is important to wean off of your opioid medication as soon as possible. If you do not need pain medication after your surgery it is ok to stop day one. Opioids include: Codeine, Hydrocodone(Norco, Vicodin), Oxycodone(Percocet, oxycontin) and hydromorphone amongst others.  Long term and even short term use of opiods can cause: Increased pain response Dependence Constipation Depression Respiratory depression And more.  Withdrawal symptoms can include Flu like symptoms Nausea, vomiting And more Techniques to manage these symptoms Hydrate well Eat regular healthy meals Stay active Use relaxation techniques(deep breathing, meditating, yoga) Do Not substitute Alcohol to help with tapering If you have been on opioids for less than two weeks and do not have pain than it is ok to stop all together.  Plan to wean off of opioids This plan should start within one week post op of your joint replacement. Maintain the same interval or time between taking each dose and first decrease the dose.  Cut the total daily intake of opioids by one tablet each day Next start to increase the time between doses. The last dose that should be eliminated is the evening dose.         Allergies as of 04/04/2023   Not on File      Medication List     TAKE these medications    ALPRAZolam 0.25 MG  tablet Commonly known as: XANAX Take 1 tablet (0.25 mg total) by mouth at bedtime. What changed:  when to take this reasons to take this   aspirin EC 81 MG tablet Take 1 tablet (81 mg total) by mouth daily. Swallow whole.   atorvastatin 20 MG tablet Commonly known as: LIPITOR Take 1 tablet (20 mg total) by mouth daily.   clopidogrel 75 MG tablet Commonly known as: PLAVIX TAKE 1 TABLET BY MOUTH EVERY DAY   fluticasone 50 MCG/ACT nasal spray Commonly known as: FLONASE Place 1 spray into both nostrils daily as needed for allergies.   ketoconazole 2 % cream Commonly known as: NIZORAL Apply 1 application  topically 2 (two) times daily as needed for irritation.   metoprolol tartrate 25 MG tablet Commonly known as: LOPRESSOR TAKE 1 TABLET (25 MG TOTAL) BY MOUTH DAILY.  oxyCODONE 15 MG immediate release tablet Commonly known as: ROXICODONE Take 1 tablet (15 mg total) by mouth every 4 (four) hours as needed for severe pain. What changed:  medication strength how much to take reasons to take this   pantoprazole 40 MG tablet Commonly known as: Protonix Take 1 tablet (40 mg total) by mouth daily.   sennosides-docusate sodium 8.6-50 MG tablet Commonly known as: SENOKOT-S Take 1 tablet by mouth daily.   sertraline 100 MG tablet Commonly known as: ZOLOFT TAKE 1 TABLET BY MOUTH EVERY DAY What changed: how much to take   tamsulosin 0.4 MG Caps capsule Commonly known as: FLOMAX Take 1 capsule (0.4 mg total) by mouth daily.   Vitamin D (Ergocalciferol) 1.25 MG (50000 UNIT) Caps capsule Commonly known as: DRISDOL TAKE 1 CAPSULE BY MOUTH ONE TIME PER WEEK   Xtampza ER 9 MG C12a Generic drug: oxyCODONE ER Take 1 capsule by mouth every 12 (twelve) hours.        Follow-up Information     Haddix, Gillie Manners, MD. Go in 2 week(s).   Specialty: Orthopedic Surgery Why: for wound check and repeat x-rays Contact information: [address] [place]  [address] [phone]                 Discharge Instructions and Plan: Patient will be discharged to home. Will be discharged on Aspirin and Plavix  for DVT prophylaxis. Patient has been provided with all the necessary DME for discharge. Patient will follow up with Dr. Jena Gauss in 2 weeks for repeat x-rays and suture removal.   Signed:  Thompson Caul, PA-C ?[phone]? (phone) 04/04/2023, 8:44 AM  [facility] [address] [place] [address] [phone] Chris [name] (F)

## 2023-04-04 NOTE — Progress Notes (Signed)
Pt's wife picked up TOC medications from pharmacy before pt discharged.

## 2023-04-04 NOTE — Discharge Instructions (Signed)
[facility] Discharge Instructions   General Discharge Instructions  WEIGHT BEARING STATUS:Touchdown weightbearing right lower extremity  RANGE OF MOTION/ACTIVITY: ok for hip, knee, ankle range of motion as tolerated  Wound Care: You may remove your surgical dressing on post-op day 2, (Friday 04/05/23). Incisions can be left open to air if there is no drainage. Once the incision is completely dry and without drainage, it may be left open to air out.  Showering may begin post-op day 3, (Saturday 04/06/23 ).  Clean incision gently with soap and water.  DVT/PE prophylaxis: Aspirin and Plavix  Diet: as you were eating previously.  Can use over the counter stool softeners and bowel preparations, such as Miralax, to help with bowel movements.  Narcotics can be constipating.  Be sure to drink plenty of fluids  PAIN MEDICATION USE AND EXPECTATIONS  You have likely been given narcotic medications to help control your pain.  After a traumatic event that results in an fracture (broken bone) with or without surgery, it is ok to use narcotic pain medications to help control one's pain.  We understand that everyone responds to pain differently and each individual patient will be evaluated on a regular basis for the continued need for narcotic medications. Ideally, narcotic medication use should last no more than 6-8 weeks (coinciding with fracture healing).   As a patient it is your responsibility as well to monitor narcotic medication use and report the amount and frequency you use these medications when you come to your office visit.   We would also advise that if you are using narcotic medications, you should take a dose prior to therapy to maximize you participation.  IF YOU ARE ON NARCOTIC MEDICATIONS IT IS NOT PERMISSIBLE TO OPERATE A MOTOR VEHICLE (MOTORCYCLE/CAR/TRUCK/MOPED) OR HEAVY MACHINERY DO NOT MIX NARCOTICS WITH OTHER CNS (CENTRAL NERVOUS SYSTEM) DEPRESSANTS SUCH AS  ALCOHOL   STOP SMOKING OR USING NICOTINE PRODUCTS!!!!  As discussed nicotine severely impairs your body's ability to heal surgical and traumatic wounds but also impairs bone healing.  Wounds and bone heal by forming microscopic blood vessels (angiogenesis) and nicotine is a vasoconstrictor (essentially, shrinks blood vessels).  Therefore, if vasoconstriction occurs to these microscopic blood vessels they essentially disappear and are unable to deliver necessary nutrients to the healing tissue.  This is one modifiable factor that you can do to dramatically increase your chances of healing your injury.    (This means no smoking, no nicotine gum, patches, etc)  DO NOT USE NONSTEROIDAL ANTI-INFLAMMATORY DRUGS (NSAID'S)  Using products such as Advil (ibuprofen), Aleve (naproxen), Motrin (ibuprofen) for additional pain control during fracture healing can delay and/or prevent the healing response.  If you would like to take over the counter (OTC) medication, Tylenol (acetaminophen) is ok.  However, some narcotic medications that are given for pain control contain acetaminophen as well. Therefore, you should not exceed more than 4000 mg of tylenol in a day if you do not have liver disease.  Also note that there are may OTC medicines, such as cold medicines and allergy medicines that my contain tylenol as well.  If you have any questions about medications and/or interactions please ask your doctor/PA or your pharmacist.      ICE AND ELEVATE INJURED/OPERATIVE EXTREMITY  Using ice and elevating the injured extremity above your heart can help with swelling and pain control.  Icing in a pulsatile fashion, such as 20 minutes on and 20 minutes off, can be followed.    Do  not place ice directly on skin. Make sure there is a barrier between to skin and the ice pack.    Using frozen items such as frozen peas works well as the conform nicely to the are that needs to be iced.  USE AN ACE WRAP OR TED HOSE FOR SWELLING  CONTROL  In addition to icing and elevation, Ace wraps or TED hose are used to help limit and resolve swelling.  It is recommended to use Ace wraps or TED hose until you are informed to stop.    When using Ace Wraps start the wrapping distally (farthest away from the body) and wrap proximally (closer to the body)   Example: If you had surgery on your leg or thing and you do not have a splint on, start the ace wrap at the toes and work your way up to the thigh        If you had surgery on your upper extremity and do not have a splint on, start the ace wrap at your fingers and work your way up to the upper arm   CALL THE OFFICE WITH ANY QUESTIONS OR CONCERNS: [phone]   VISIT OUR WEBSITE FOR ADDITIONAL INFORMATION: [email or web address]    Discharge Wound Care Instructions  Do NOT apply any ointments, solutions or lotions to pin sites or surgical wounds.  These prevent needed drainage and even though solutions like hydrogen peroxide kill bacteria, they also damage cells lining the pin sites that help fight infection.  Applying lotions or ointments can keep the wounds moist and can cause them to breakdown and open up as well. This can increase the risk for infection. When in doubt call the office.  If any drainage is noted, use waterproof or foam dressings - These dressing supplies should be available at local medical supply stores [facility], etc) as well as Insurance claims handler ([facility], [facility], [employer], etc)  Once the incision is completely dry and without drainage, it may be left open to air out.  Showering may begin 36-48 hours later.  Cleaning gently with soap and water.

## 2023-04-04 NOTE — Evaluation (Addendum)
Occupational Therapy Evaluation Patient Details Name: Chris [name] MRN: [id number] DOB: [birth year] Today's Date: 04/04/2023   History of Present Illness 62 y.o. male who presents for revision fixation of his proximal femur status post failed fixation and femoral nonunion. 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 PMH of GERD, dypsnea, arthritis, anxiety, cancer, HTN.   Clinical Impression   Pt admitted for above dx, PTA pt lived with spouse and reports being ind in most bADLs with occasional assist from spouse for LBD, ambulating with canes and Rollator at home. Educated pt on new WB status and the use of gait belt to assist with maneuvering RLE. Pt completing functional transfers, bADLs, and ambulating with min guard to supervision with min cues to maintain RLE TDWB. Instructed pt to complete AROM of RLE, including adduction/abduction to maintain joint ROM post discharge using a belt prn to assist with full leg ext. He does need more assist with dressing RLE. Pt would benefit from continued acute skilled OT to address above deficits and help transition to next level of care. Pt has no post acute OT needs at this time.      Recommendations for follow up therapy are one component of a multi-disciplinary discharge planning process, led by the attending physician.  Recommendations may be updated based on patient status, additional functional criteria and insurance authorization.   Assistance Recommended at Discharge Set up Supervision/Assistance  Patient can return home with the following A lot of help with bathing/dressing/bathroom;Assistance with cooking/housework    Functional Status Assessment  Patient has had a recent decline in their functional status and demonstrates the ability to make significant improvements in function in a reasonable and predictable amount of time.  Equipment Recommendations  None recommended by OT (Pt has rec DME)    Recommendations for Other  Services       Precautions / Restrictions Precautions Precautions: Fall Restrictions Weight Bearing Restrictions: Yes RLE Weight Bearing: Touchdown weight bearing      Mobility Bed Mobility Overal bed mobility: Needs Assistance Bed Mobility: Supine to Sit     Supine to sit: Supervision     General bed mobility comments: Pt using UEs to assist RLE with bed mobility, educated pt on the use of belt as leg lifter    Transfers Overall transfer level: Needs assistance Equipment used: Rolling walker (2 wheels) Transfers: Sit to/from Stand Sit to Stand: Min guard           General transfer comment: slower to rise, cues for hand placement during STS to deter weight from RLE      Balance Overall balance assessment: Needs assistance Sitting-balance support: Feet supported Sitting balance-Leahy Scale: Normal       Standing balance-Leahy Scale: Fair Standing balance comment: Static stand to use urinal with no UE support                           ADL either performed or assessed with clinical judgement   ADL   Eating/Feeding: Independent;Sitting   Grooming: Standing;Supervision/safety;Wash/dry hands       Lower Body Bathing: Sitting/lateral leans;Minimal assistance   Upper Body Dressing : Standing;Supervision/safety;Set up   Lower Body Dressing: Sitting/lateral leans;Maximal assistance Lower Body Dressing Details (indicate cue type and reason): Max A to don R sock, Min guard to don L sock Toilet Transfer: Ambulation;Rolling walker (2 wheels);Min guard           Functional mobility during ADLs:  Min guard;Rolling walker (2 wheels) General ADL Comments: Min cues to maintain TDWB status on RLE     Vision Baseline Vision/History: 1 Wears glasses       Perception     Praxis      Pertinent Vitals/Pain Pain Assessment Pain Assessment: 0-10 Pain Score: 4  Pain Location: Rt thigh Pain Descriptors / Indicators: Aching Pain Intervention(s):  Repositioned, Monitored during session     Hand Dominance Right   Extremity/Trunk Assessment Upper Extremity Assessment Upper Extremity Assessment: Overall WFL for tasks assessed   Lower Extremity Assessment Lower Extremity Assessment: Defer to PT evaluation   Cervical / Trunk Assessment Cervical / Trunk Assessment: Normal   Communication Communication Communication: No difficulties   Cognition Arousal/Alertness: Awake/alert Behavior During Therapy: WFL for tasks assessed/performed Overall Cognitive Status: Within Functional Limits for tasks assessed                                       General Comments       Exercises General Exercises - Lower Extremity Straight Leg Raises: AAROM, 5 reps, Other (comment) (with use of gait belt)   Shoulder Instructions      Home Living Family/patient expects to be discharged to:: Private residence Living Arrangements: Spouse/significant other Available Help at Discharge: Family;Available PRN/intermittently (wife works from 8-5pm) Type of Home: House Home Access: Stairs to enter Secretary/administrator of Steps: 4 Entrance Stairs-Rails: Left Home Layout: One level     Bathroom Shower/Tub: Producer, television/film/video: Standard     Home Equipment: Shower seat - built in;Grab bars - tub/shower;Cane - quad;Rollator (4 wheels);Grab bars - toilet;Wheelchair - Forensic psychologist (2 wheels)   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 Prior Level of Function : Driving;Needs assist;Working/employed             Mobility Comments: ambulates with Occupational hygienist and hurricanes PTA ADLs Comments: ind in most bADLs, wife occasionally assists with LBD        OT Problem List: Pain;Impaired balance (sitting and/or standing)      OT Treatment/Interventions: Self-care/ADL training;DME and/or AE instruction    OT Goals(Current goals can be found in the care plan section) Acute  Rehab OT Goals Patient Stated Goal: To go home OT Goal Formulation: With patient Time For Goal Achievement: 04/18/23 Potential to Achieve Goals: Good ADL Goals Pt Will Perform Lower Body Bathing: sitting/lateral leans;with supervision (with AE prn) Pt Will Perform Lower Body Dressing: with supervision;sitting/lateral leans (with AE prn)  OT Frequency: Min 2X/week    Co-evaluation              AM-PAC OT "6 Clicks" Daily Activity     Outcome Measure Help from another person eating meals?: None Help from another person taking care of personal grooming?: A Little Help from another person toileting, which includes using toliet, bedpan, or urinal?: A Little Help from another person bathing (including washing, rinsing, drying)?: A Little Help from another person to put on and taking off regular upper body clothing?: None Help from another person to put on and taking off regular lower body clothing?: A Lot 6 Click Score: 19   End of Session Equipment Utilized During Treatment: Gait belt;Rolling walker (2 wheels) Nurse Communication: Mobility status  Activity Tolerance: Patient tolerated treatment well Patient left: in chair;Other (comment) (in care of the PT)  OT Visit Diagnosis: Unsteadiness on  feet (R26.81);Pain Pain - Right/Left: Right Pain - part of body: Knee                Time: 0930-1005 OT Time Calculation (min): 35 min Charges:  OT General Charges $OT Visit: 1 Visit OT Evaluation $OT Eval Moderate Complexity: 1 Mod OT Treatments $Self Care/Home Management : 8-22 mins  04/04/2023  AB, OTR/L  Acute Rehabilitation Services  Office: [phone]   Tristan Schroeder 04/04/2023, 10:19 AM

## 2023-04-05 ENCOUNTER — Other Ambulatory Visit: Payer: Self-pay

## 2023-04-05 DIAGNOSIS — F419 Anxiety disorder, unspecified: Secondary | ICD-10-CM | POA: Diagnosis not present

## 2023-04-05 DIAGNOSIS — Z79891 Long term (current) use of opiate analgesic: Secondary | ICD-10-CM | POA: Diagnosis not present

## 2023-04-05 DIAGNOSIS — Z7902 Long term (current) use of antithrombotics/antiplatelets: Secondary | ICD-10-CM | POA: Diagnosis not present

## 2023-04-05 DIAGNOSIS — Z7982 Long term (current) use of aspirin: Secondary | ICD-10-CM | POA: Diagnosis not present

## 2023-04-05 DIAGNOSIS — I2510 Atherosclerotic heart disease of native coronary artery without angina pectoris: Secondary | ICD-10-CM | POA: Diagnosis not present

## 2023-04-05 DIAGNOSIS — Z7951 Long term (current) use of inhaled steroids: Secondary | ICD-10-CM | POA: Diagnosis not present

## 2023-04-05 DIAGNOSIS — S72141K Displaced intertrochanteric fracture of right femur, subsequent encounter for closed fracture with nonunion: Secondary | ICD-10-CM | POA: Diagnosis not present

## 2023-04-05 DIAGNOSIS — I10 Essential (primary) hypertension: Secondary | ICD-10-CM | POA: Diagnosis not present

## 2023-04-05 LAB — SURGICAL PATHOLOGY

## 2023-04-06 ENCOUNTER — Other Ambulatory Visit: Payer: Self-pay

## 2023-04-10 DIAGNOSIS — Z79891 Long term (current) use of opiate analgesic: Secondary | ICD-10-CM | POA: Diagnosis not present

## 2023-04-10 DIAGNOSIS — F419 Anxiety disorder, unspecified: Secondary | ICD-10-CM | POA: Diagnosis not present

## 2023-04-10 DIAGNOSIS — I10 Essential (primary) hypertension: Secondary | ICD-10-CM | POA: Diagnosis not present

## 2023-04-10 DIAGNOSIS — Z7982 Long term (current) use of aspirin: Secondary | ICD-10-CM | POA: Diagnosis not present

## 2023-04-10 DIAGNOSIS — Z7951 Long term (current) use of inhaled steroids: Secondary | ICD-10-CM | POA: Diagnosis not present

## 2023-04-10 DIAGNOSIS — I2510 Atherosclerotic heart disease of native coronary artery without angina pectoris: Secondary | ICD-10-CM | POA: Diagnosis not present

## 2023-04-10 DIAGNOSIS — S72141K Displaced intertrochanteric fracture of right femur, subsequent encounter for closed fracture with nonunion: Secondary | ICD-10-CM | POA: Diagnosis not present

## 2023-04-10 DIAGNOSIS — Z7902 Long term (current) use of antithrombotics/antiplatelets: Secondary | ICD-10-CM | POA: Diagnosis not present

## 2023-04-15 DIAGNOSIS — S7221XK Displaced subtrochanteric fracture of right femur, subsequent encounter for closed fracture with nonunion: Secondary | ICD-10-CM | POA: Diagnosis not present

## 2023-04-15 DIAGNOSIS — S7224XK Nondisplaced subtrochanteric fracture of right femur, subsequent encounter for closed fracture with nonunion: Secondary | ICD-10-CM | POA: Diagnosis not present

## 2023-04-16 ENCOUNTER — Encounter: Payer: Self-pay | Admitting: Hospice and Palliative Medicine

## 2023-04-17 DIAGNOSIS — F419 Anxiety disorder, unspecified: Secondary | ICD-10-CM | POA: Diagnosis not present

## 2023-04-17 DIAGNOSIS — I10 Essential (primary) hypertension: Secondary | ICD-10-CM | POA: Diagnosis not present

## 2023-04-17 DIAGNOSIS — Z7982 Long term (current) use of aspirin: Secondary | ICD-10-CM | POA: Diagnosis not present

## 2023-04-17 DIAGNOSIS — Z79891 Long term (current) use of opiate analgesic: Secondary | ICD-10-CM | POA: Diagnosis not present

## 2023-04-17 DIAGNOSIS — Z7951 Long term (current) use of inhaled steroids: Secondary | ICD-10-CM | POA: Diagnosis not present

## 2023-04-17 DIAGNOSIS — S72141K Displaced intertrochanteric fracture of right femur, subsequent encounter for closed fracture with nonunion: Secondary | ICD-10-CM | POA: Diagnosis not present

## 2023-04-17 DIAGNOSIS — I2510 Atherosclerotic heart disease of native coronary artery without angina pectoris: Secondary | ICD-10-CM | POA: Diagnosis not present

## 2023-04-17 DIAGNOSIS — Z7902 Long term (current) use of antithrombotics/antiplatelets: Secondary | ICD-10-CM | POA: Diagnosis not present

## 2023-04-22 ENCOUNTER — Ambulatory Visit: Payer: BC Managed Care – PPO | Admitting: Neurology

## 2023-04-23 DIAGNOSIS — Z7951 Long term (current) use of inhaled steroids: Secondary | ICD-10-CM | POA: Diagnosis not present

## 2023-04-23 DIAGNOSIS — Z7902 Long term (current) use of antithrombotics/antiplatelets: Secondary | ICD-10-CM | POA: Diagnosis not present

## 2023-04-23 DIAGNOSIS — Z7982 Long term (current) use of aspirin: Secondary | ICD-10-CM | POA: Diagnosis not present

## 2023-04-23 DIAGNOSIS — S72141K Displaced intertrochanteric fracture of right femur, subsequent encounter for closed fracture with nonunion: Secondary | ICD-10-CM | POA: Diagnosis not present

## 2023-04-23 DIAGNOSIS — I10 Essential (primary) hypertension: Secondary | ICD-10-CM | POA: Diagnosis not present

## 2023-04-23 DIAGNOSIS — Z79891 Long term (current) use of opiate analgesic: Secondary | ICD-10-CM | POA: Diagnosis not present

## 2023-04-23 DIAGNOSIS — F419 Anxiety disorder, unspecified: Secondary | ICD-10-CM | POA: Diagnosis not present

## 2023-04-23 DIAGNOSIS — I2510 Atherosclerotic heart disease of native coronary artery without angina pectoris: Secondary | ICD-10-CM | POA: Diagnosis not present

## 2023-05-01 ENCOUNTER — Other Ambulatory Visit: Payer: Self-pay | Admitting: Internal Medicine

## 2023-05-02 DIAGNOSIS — Z7982 Long term (current) use of aspirin: Secondary | ICD-10-CM | POA: Diagnosis not present

## 2023-05-02 DIAGNOSIS — Z79891 Long term (current) use of opiate analgesic: Secondary | ICD-10-CM | POA: Diagnosis not present

## 2023-05-02 DIAGNOSIS — S72141K Displaced intertrochanteric fracture of right femur, subsequent encounter for closed fracture with nonunion: Secondary | ICD-10-CM | POA: Diagnosis not present

## 2023-05-02 DIAGNOSIS — Z7951 Long term (current) use of inhaled steroids: Secondary | ICD-10-CM | POA: Diagnosis not present

## 2023-05-02 DIAGNOSIS — Z7902 Long term (current) use of antithrombotics/antiplatelets: Secondary | ICD-10-CM | POA: Diagnosis not present

## 2023-05-02 DIAGNOSIS — F419 Anxiety disorder, unspecified: Secondary | ICD-10-CM | POA: Diagnosis not present

## 2023-05-02 DIAGNOSIS — I10 Essential (primary) hypertension: Secondary | ICD-10-CM | POA: Diagnosis not present

## 2023-05-02 DIAGNOSIS — I2510 Atherosclerotic heart disease of native coronary artery without angina pectoris: Secondary | ICD-10-CM | POA: Diagnosis not present

## 2023-05-06 ENCOUNTER — Encounter: Payer: Self-pay | Admitting: Hospice and Palliative Medicine

## 2023-05-06 DIAGNOSIS — S7221XK Displaced subtrochanteric fracture of right femur, subsequent encounter for closed fracture with nonunion: Secondary | ICD-10-CM | POA: Diagnosis not present

## 2023-05-07 DIAGNOSIS — I10 Essential (primary) hypertension: Secondary | ICD-10-CM | POA: Diagnosis not present

## 2023-05-07 DIAGNOSIS — S72141K Displaced intertrochanteric fracture of right femur, subsequent encounter for closed fracture with nonunion: Secondary | ICD-10-CM | POA: Diagnosis not present

## 2023-05-07 DIAGNOSIS — Z7902 Long term (current) use of antithrombotics/antiplatelets: Secondary | ICD-10-CM | POA: Diagnosis not present

## 2023-05-07 DIAGNOSIS — I2510 Atherosclerotic heart disease of native coronary artery without angina pectoris: Secondary | ICD-10-CM | POA: Diagnosis not present

## 2023-05-07 DIAGNOSIS — Z79891 Long term (current) use of opiate analgesic: Secondary | ICD-10-CM | POA: Diagnosis not present

## 2023-05-07 DIAGNOSIS — F419 Anxiety disorder, unspecified: Secondary | ICD-10-CM | POA: Diagnosis not present

## 2023-05-07 DIAGNOSIS — Z7951 Long term (current) use of inhaled steroids: Secondary | ICD-10-CM | POA: Diagnosis not present

## 2023-05-07 DIAGNOSIS — Z7982 Long term (current) use of aspirin: Secondary | ICD-10-CM | POA: Diagnosis not present

## 2023-05-07 NOTE — Telephone Encounter (Signed)
I spoke with patient's wife.  She says that patient has had follow-up with the orthopedic surgeon and bone has had delayed healing.  However, she says patient does appear to be slowly improving with physical therapy.  She will keep [place] posted on when [facility] steps.

## 2023-05-17 ENCOUNTER — Encounter: Payer: Self-pay | Admitting: Hospice and Palliative Medicine

## 2023-05-17 DIAGNOSIS — Z7902 Long term (current) use of antithrombotics/antiplatelets: Secondary | ICD-10-CM | POA: Diagnosis not present

## 2023-05-17 DIAGNOSIS — I10 Essential (primary) hypertension: Secondary | ICD-10-CM | POA: Diagnosis not present

## 2023-05-17 DIAGNOSIS — F419 Anxiety disorder, unspecified: Secondary | ICD-10-CM | POA: Diagnosis not present

## 2023-05-17 DIAGNOSIS — S72141K Displaced intertrochanteric fracture of right femur, subsequent encounter for closed fracture with nonunion: Secondary | ICD-10-CM | POA: Diagnosis not present

## 2023-05-17 DIAGNOSIS — I2510 Atherosclerotic heart disease of native coronary artery without angina pectoris: Secondary | ICD-10-CM | POA: Diagnosis not present

## 2023-05-17 DIAGNOSIS — Z7982 Long term (current) use of aspirin: Secondary | ICD-10-CM | POA: Diagnosis not present

## 2023-05-17 DIAGNOSIS — Z79891 Long term (current) use of opiate analgesic: Secondary | ICD-10-CM | POA: Diagnosis not present

## 2023-05-17 DIAGNOSIS — Z7951 Long term (current) use of inhaled steroids: Secondary | ICD-10-CM | POA: Diagnosis not present

## 2023-05-21 ENCOUNTER — Other Ambulatory Visit: Payer: Self-pay | Admitting: *Deleted

## 2023-05-22 ENCOUNTER — Other Ambulatory Visit: Payer: Self-pay

## 2023-05-22 ENCOUNTER — Encounter: Payer: Self-pay | Admitting: Internal Medicine

## 2023-05-22 MED ORDER — XTAMPZA ER 9 MG PO C12A
1.0000 | EXTENDED_RELEASE_CAPSULE | Freq: Two times a day (BID) | ORAL | 0 refills | Status: DC
  Filled 2023-05-22: qty 30, 15d supply, fill #0

## 2023-05-23 ENCOUNTER — Other Ambulatory Visit: Payer: Self-pay

## 2023-05-28 ENCOUNTER — Other Ambulatory Visit: Payer: Self-pay | Admitting: *Deleted

## 2023-05-28 ENCOUNTER — Other Ambulatory Visit: Payer: Self-pay | Admitting: Internal Medicine

## 2023-05-28 ENCOUNTER — Telehealth: Payer: Self-pay

## 2023-05-28 DIAGNOSIS — C3411 Malignant neoplasm of upper lobe, right bronchus or lung: Secondary | ICD-10-CM

## 2023-05-28 NOTE — Progress Notes (Signed)
Spoke to pt's wife- pt back to work part time.   I will order CT scan as per the wife's request. Last scan in march 2024.   Schedule follow up in 2 weeks- MD; labs- cbc/cmp; Thyroid profile; treatment-; CT N-CAP- in 1 week-

## 2023-05-28 NOTE — Telephone Encounter (Signed)
Patients wife called stating that patient is having some "throat issues" and would like to know if he can go ahead and have a scan done. Call back # [phone]

## 2023-05-29 ENCOUNTER — Other Ambulatory Visit: Payer: Self-pay | Admitting: Internal Medicine

## 2023-05-29 NOTE — Progress Notes (Signed)
Chemo planned for 8/21; CT scan on 8/13

## 2023-05-30 ENCOUNTER — Other Ambulatory Visit: Payer: Self-pay | Admitting: Cardiovascular Disease

## 2023-05-31 DIAGNOSIS — Z79891 Long term (current) use of opiate analgesic: Secondary | ICD-10-CM | POA: Diagnosis not present

## 2023-05-31 DIAGNOSIS — I10 Essential (primary) hypertension: Secondary | ICD-10-CM | POA: Diagnosis not present

## 2023-05-31 DIAGNOSIS — F419 Anxiety disorder, unspecified: Secondary | ICD-10-CM | POA: Diagnosis not present

## 2023-05-31 DIAGNOSIS — Z7902 Long term (current) use of antithrombotics/antiplatelets: Secondary | ICD-10-CM | POA: Diagnosis not present

## 2023-05-31 DIAGNOSIS — Z7951 Long term (current) use of inhaled steroids: Secondary | ICD-10-CM | POA: Diagnosis not present

## 2023-05-31 DIAGNOSIS — S72141K Displaced intertrochanteric fracture of right femur, subsequent encounter for closed fracture with nonunion: Secondary | ICD-10-CM | POA: Diagnosis not present

## 2023-05-31 DIAGNOSIS — I2510 Atherosclerotic heart disease of native coronary artery without angina pectoris: Secondary | ICD-10-CM | POA: Diagnosis not present

## 2023-05-31 DIAGNOSIS — Z7982 Long term (current) use of aspirin: Secondary | ICD-10-CM | POA: Diagnosis not present

## 2023-06-03 ENCOUNTER — Ambulatory Visit
Admission: RE | Admit: 2023-06-03 | Discharge: 2023-06-03 | Disposition: A | Discharge status: Home / Self Care | Payer: BC Managed Care – PPO | Source: Ambulatory Visit | Attending: Internal Medicine | Admitting: Internal Medicine

## 2023-06-03 DIAGNOSIS — C3411 Malignant neoplasm of upper lobe, right bronchus or lung: Secondary | ICD-10-CM | POA: Diagnosis not present

## 2023-06-03 DIAGNOSIS — J329 Chronic sinusitis, unspecified: Secondary | ICD-10-CM | POA: Diagnosis not present

## 2023-06-03 DIAGNOSIS — S7221XK Displaced subtrochanteric fracture of right femur, subsequent encounter for closed fracture with nonunion: Secondary | ICD-10-CM | POA: Diagnosis not present

## 2023-06-03 DIAGNOSIS — C787 Secondary malignant neoplasm of liver and intrahepatic bile duct: Secondary | ICD-10-CM | POA: Diagnosis not present

## 2023-06-03 DIAGNOSIS — C3490 Malignant neoplasm of unspecified part of unspecified bronchus or lung: Secondary | ICD-10-CM | POA: Diagnosis not present

## 2023-06-03 DIAGNOSIS — I700 Atherosclerosis of aorta: Secondary | ICD-10-CM | POA: Diagnosis not present

## 2023-06-03 DIAGNOSIS — J341 Cyst and mucocele of nose and nasal sinus: Secondary | ICD-10-CM | POA: Diagnosis not present

## 2023-06-03 LAB — POCT I-STAT CREATININE: Creatinine, Ser: 0.9 mg/dL (ref 0.61–1.24)

## 2023-06-03 MED ORDER — IOHEXOL 300 MG/ML  SOLN
100.0000 mL | Freq: Once | INTRAMUSCULAR | Status: AC | PRN
  Administered 2023-06-03: 100 mL via INTRAVENOUS

## 2023-06-04 ENCOUNTER — Ambulatory Visit: Payer: BC Managed Care – PPO

## 2023-06-10 ENCOUNTER — Encounter: Payer: Self-pay | Admitting: Cardiovascular Disease

## 2023-06-11 ENCOUNTER — Other Ambulatory Visit: Payer: BC Managed Care – PPO

## 2023-06-11 ENCOUNTER — Ambulatory Visit: Payer: BC Managed Care – PPO | Admitting: Internal Medicine

## 2023-06-11 ENCOUNTER — Ambulatory Visit: Payer: BC Managed Care – PPO

## 2023-06-14 ENCOUNTER — Inpatient Hospital Stay: Payer: BC Managed Care – PPO | Attending: Internal Medicine

## 2023-06-14 ENCOUNTER — Encounter: Payer: Self-pay | Admitting: Internal Medicine

## 2023-06-14 ENCOUNTER — Inpatient Hospital Stay: Payer: BC Managed Care – PPO | Admitting: Internal Medicine

## 2023-06-14 ENCOUNTER — Other Ambulatory Visit: Payer: Self-pay | Admitting: Internal Medicine

## 2023-06-14 VITALS — BP 118/100 | HR 91 | Temp 97.6°F | Ht 71.0 in | Wt 240.6 lb

## 2023-06-14 DIAGNOSIS — C787 Secondary malignant neoplasm of liver and intrahepatic bile duct: Secondary | ICD-10-CM | POA: Insufficient documentation

## 2023-06-14 DIAGNOSIS — C61 Malignant neoplasm of prostate: Secondary | ICD-10-CM | POA: Diagnosis not present

## 2023-06-14 DIAGNOSIS — Z79899 Other long term (current) drug therapy: Secondary | ICD-10-CM | POA: Insufficient documentation

## 2023-06-14 DIAGNOSIS — G893 Neoplasm related pain (acute) (chronic): Secondary | ICD-10-CM | POA: Insufficient documentation

## 2023-06-14 DIAGNOSIS — C3411 Malignant neoplasm of upper lobe, right bronchus or lung: Secondary | ICD-10-CM | POA: Insufficient documentation

## 2023-06-14 DIAGNOSIS — C7951 Secondary malignant neoplasm of bone: Secondary | ICD-10-CM | POA: Diagnosis not present

## 2023-06-14 DIAGNOSIS — Z79620 Long term (current) use of immunosuppressive biologic: Secondary | ICD-10-CM | POA: Diagnosis not present

## 2023-06-14 DIAGNOSIS — F17210 Nicotine dependence, cigarettes, uncomplicated: Secondary | ICD-10-CM | POA: Insufficient documentation

## 2023-06-14 DIAGNOSIS — I2510 Atherosclerotic heart disease of native coronary artery without angina pectoris: Secondary | ICD-10-CM | POA: Diagnosis not present

## 2023-06-14 DIAGNOSIS — Z9221 Personal history of antineoplastic chemotherapy: Secondary | ICD-10-CM | POA: Diagnosis not present

## 2023-06-14 DIAGNOSIS — R5383 Other fatigue: Secondary | ICD-10-CM | POA: Diagnosis not present

## 2023-06-14 LAB — CBC WITH DIFFERENTIAL (CANCER CENTER ONLY)
Abs Immature Granulocytes: 0.01 10*3/uL (ref 0.00–0.07)
Basophils Absolute: 0 10*3/uL (ref 0.0–0.1)
Basophils Relative: 0 %
Eosinophils Absolute: 0.1 10*3/uL (ref 0.0–0.5)
Eosinophils Relative: 2 %
HCT: 45.6 % (ref 39.0–52.0)
Hemoglobin: 15 g/dL (ref 13.0–17.0)
Immature Granulocytes: 0 %
Lymphocytes Relative: 18 %
Lymphs Abs: 1.3 10*3/uL (ref 0.7–4.0)
MCH: 29.1 pg (ref 26.0–34.0)
MCHC: 32.9 g/dL (ref 30.0–36.0)
MCV: 88.4 fL (ref 80.0–100.0)
Monocytes Absolute: 0.9 10*3/uL (ref 0.1–1.0)
Monocytes Relative: 12 %
Neutro Abs: 4.6 10*3/uL (ref 1.7–7.7)
Neutrophils Relative %: 68 %
Platelet Count: 241 10*3/uL (ref 150–400)
RBC: 5.16 MIL/uL (ref 4.22–5.81)
RDW: 14.8 % (ref 11.5–15.5)
WBC Count: 6.8 10*3/uL (ref 4.0–10.5)
nRBC: 0 % (ref 0.0–0.2)

## 2023-06-14 LAB — CMP (CANCER CENTER ONLY)
ALT: 23 U/L (ref 0–44)
AST: 22 U/L (ref 15–41)
Albumin: 4.3 g/dL (ref 3.5–5.0)
Alkaline Phosphatase: 85 U/L (ref 38–126)
Anion gap: 9 (ref 5–15)
BUN: 15 mg/dL (ref 8–23)
CO2: 22 mmol/L (ref 22–32)
Calcium: 9.3 mg/dL (ref 8.9–10.3)
Chloride: 106 mmol/L (ref 98–111)
Creatinine: 0.9 mg/dL (ref 0.61–1.24)
GFR, Estimated: >60 mL/min (ref >60)
Glucose, Bld: 101 mg/dL — ABNORMAL HIGH (ref 70–99)
Potassium: 3.8 mmol/L (ref 3.5–5.1)
Sodium: 137 mmol/L (ref 135–145)
Total Bilirubin: 0.5 mg/dL (ref 0.3–1.2)
Total Protein: 7.6 g/dL (ref 6.5–8.1)

## 2023-06-14 NOTE — Progress Notes (Signed)
[facility] CONSULT NOTE  Patient Care Team: Sherrie Mustache, MD as PCP - General (Internal Medicine) Runell Gess, MD as PCP - Cardiology (Cardiology) [name], MD as Consulting Physician (Oncology) Sherrie Mustache, MD as Referring Physician (Internal Medicine) Earna Coder, MD as Consulting Physician (Internal Medicine) Earna Coder, MD as Consulting Physician (Internal Medicine)  CHIEF COMPLAINTS/PURPOSE OF CONSULTATION: lung cancer  #  Oncology History Overview Note  DIAGNOSIS: 1) stage IV (T1c, N0, M1 C) non-small cell lung cancer favoring adenocarcinoma presented with right lung apical nodule in addition to metastatic disease in the right hepatic lobe and the proximal right femoral diaphysis diagnosed and December 2022. 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    QNS; Guardant 360 showed positive KRAS G12C mutation  FINAL MICROSCOPIC DIAGNOSIS:   A. LUNG, RUL, FINE NEEDLE ASPIRATION:  - Malignant cells consistent with non-small cell carcinoma, see comment   B. LUNG, RUL, BRUSHING:  - Malignant cells consistent with non-small cell carcinoma, see comment       COMMENT:   A and B.  Dr. Luisa Hart reviewed the case and concurs with the diagnosis.  Only rare malignant cells are present on the cellblock.  Immunohistochemical stains were attempted and show that the tumor cells  have patchy staining for TTF-1 whereas p63, p40 and CK5/6 are negative.  The findings are nondiagnostic but suggestive of a lung adenocarcinoma.  Dr. Delton Coombes was notified on 10/13/2021.   SEP-OCT 2022-  [Dermatology]-   Poorly differentiated squamous cell carcinoma; -  Carcinoma extends to the edges of the excision specimen   IMPRESSION: 1. The spiculated nodule at the right lung apex on recent neck CT is hypermetabolic and is concerning for primary bronchogenic  carcinoma. Tissue sampling recommended. 2. Hypermetabolic activity within the occipital scalp and small previously demonstrated right occipital lymph node compatible with known squamous cell carcinoma. Evaluation of the head and neck limited by motion artifact. 3. Hypermetabolic lesions inferiorly in the right hepatic lobe and in the proximal right femoral diaphysis suspicious for metastatic disease, primary uncertain in this patient with a history of prostate cancer. Correlate with PSA levels. Abdominal MRI without and with contrast may be helpful for further characterization of the liver lesion.  # s/p RT tto RUL [[place]]; right hip RT;   # 2007MI-CAD [s/p stent-Dr.Berry; EF 2021- 58%]     Primary adenocarcinoma of upper lobe of right lung (HCC)  10/18/2021 Initial Diagnosis   Primary adenocarcinoma of upper lobe of right lung (HCC)   10/18/2021 Cancer Staging   Staging form: Lung, AJCC 8th Edition - Clinical: Stage IVB (cT1c, cN0, cM1c) - Signed by [name], MD on 10/18/2021   11/01/2021 - 05/30/2022 Chemotherapy   Patient is on Treatment Plan : LUNG NSCLC Pemetrexed + Carboplatin q21d x 4 Cycles     11/01/2021 -  Chemotherapy   Patient is on Treatment Plan : LUNG NSCLC Libtayo q  21d       HISTORY OF PRESENTING ILLNESS: Ambulating with a cane.  Alone.   Chris [name] 62 y.o.  male metastatic stage IV non-small cell lung cancer/favor adenocarcinoma-currently on maintained libatyo here for follow-up/review the results of the CT scan.  S/p evaluation with orthopedics in [place] for his ongoing right hip pain- s/p revision surgery.   C/o fatigue. Taking 2 hour naps in afternoon. Doing well. Appetite is good. Breathing well.  No nausea no vomiting.  Review  of Systems  Constitutional:  Positive for malaise/fatigue. Negative for chills, diaphoresis, fever and weight loss.  HENT:  Positive for hearing loss. Negative for nosebleeds and sore throat.   Eyes:  Negative for double  vision.  Respiratory:  Negative for cough, hemoptysis, sputum production, shortness of breath and wheezing.   Cardiovascular:  Negative for chest pain, palpitations, orthopnea and leg swelling.  Gastrointestinal:  Negative for abdominal pain, blood in stool, diarrhea, heartburn, melena, nausea and vomiting.  Genitourinary:  Negative for dysuria, frequency and urgency.  Musculoskeletal:  Negative for back pain.  Skin: Negative.  Negative for itching and rash.  Neurological:  Negative for dizziness, tingling, focal weakness, weakness and headaches.  Endo/Heme/Allergies:  Does not bruise/bleed easily.  Psychiatric/Behavioral:  Negative for depression. The patient does not have insomnia.      MEDICAL HISTORY:  Past Medical History:  Diagnosis Date   Anxiety    associated with medical care,  worsened by difficulty hearing, does better with wife present   Arthritis    Cancer associated pain    Cancer, metastatic to liver [facility])    Complication of anesthesia    wife states very anxious may need pre sedation   Coronary artery disease    Dyspnea    GERD (gastroesophageal reflux disease)    Hearing loss    mild per wife   Hearing loss    no hearing aids per wife   Hyperlipidemia    Hypertension    MI (myocardial infarction) (HCC) 04/17/2006   Acute inferolateral wall MI with cardiogenic shock and complete heart block   Primary adenocarcinoma of upper lobe of right lung (HCC)    Prostate cancer (HCC)    Sleep apnea    Tobacco abuse    Wears glasses    Wears partial dentures    Upper    SURGICAL HISTORY: Past Surgical History:  Procedure Laterality Date   BRONCHIAL BIOPSY  10/09/2021   Procedure: BRONCHIAL BIOPSIES;  Surgeon: Leslye Peer, MD;  Location: [facility] ENDOSCOPY;  Service: Pulmonary;;   BRONCHIAL BRUSHINGS  10/09/2021   Procedure: BRONCHIAL BRUSHINGS;  Surgeon: Leslye Peer, MD;  Location: [facility] ENDOSCOPY;  Service: Pulmonary;;   BRONCHIAL NEEDLE ASPIRATION BIOPSY   10/09/2021   Procedure: BRONCHIAL NEEDLE ASPIRATION BIOPSIES;  Surgeon: Leslye Peer, MD;  Location: [facility] ENDOSCOPY;  Service: Pulmonary;;   CARDIAC CATHETERIZATION  2007   left, RCA 100% occluded ruptured plaque with thrombus in the proximal segment   CYST EXCISION N/A 08/30/2021   Procedure: EXCISION OF POSTERIOR SCALP CYST;  Surgeon: Berna Bue, MD;  Location: [facility] ORS;  Service: General;  Laterality: N/A;   CYSTOSCOPY N/A 07/17/2018   Procedure: Bluford Kaufmann;  Surgeon: Marcine Matar, MD;  Location: [facility];  Service: Urology;  Laterality: N/A;  no seeds in bladder per Dr Retta Diones   FEMUR IM NAIL Right 04/03/2023   Procedure: REVISION FIXATION OF RIGHT FEMUR NONUNION;  Surgeon: Roby Lofts, MD;  Location: [facility] OR;  Service: Orthopedics;  Laterality: Right;   FIDUCIAL MARKER PLACEMENT  10/09/2021   Procedure: FIDUCIAL MARKER PLACEMENT;  Surgeon: Leslye Peer, MD;  Location: [facility] ENDOSCOPY;  Service: Pulmonary;;   HAND SURGERY Right    has metal plate in arm   HARDWARE REMOVAL Right 04/03/2023   Procedure: REMOVAL OF PREVIOUS HARDWARE FEMUR;  Surgeon: Roby Lofts, MD;  Location: [facility] OR;  Service: Orthopedics;  Laterality: Right;   PROSTATE BIOPSY     RADIOACTIVE SEED IMPLANT N/A  07/17/2018   Procedure: RADIOACTIVE SEED IMPLANT/BRACHYTHERAPY IMPLANT;  Surgeon: Marcine Matar, MD;  Location: [facility];  Service: Urology;  Laterality: N/A;  77 seeds   SPACE OAR INSTILLATION N/A 07/17/2018   Procedure: SPACE OAR INSTILLATION;  Surgeon: Marcine Matar, MD;  Location: [facility];  Service: Urology;  Laterality: N/A;   VIDEO BRONCHOSCOPY WITH RADIAL ENDOBRONCHIAL ULTRASOUND  10/09/2021   Procedure: VIDEO BRONCHOSCOPY WITH RADIAL ENDOBRONCHIAL ULTRASOUND;  Surgeon: Leslye Peer, MD;  Location: [facility] ENDOSCOPY;  Service: Pulmonary;;   WRIST SURGERY  10/04/2012    SOCIAL HISTORY: Social History   Socioeconomic History    Marital status: Married    Spouse name: Not on file   Number of children: Not on file   Years of education: Not on file   Highest education level: Not on file  Occupational History   Not on file  Tobacco Use   Smoking status: Every Day    Current packs/day: 0.75    Average packs/day: 0.8 packs/day for 42.0 years (31.5 ttl pk-yrs)    Types: Cigarettes   Smokeless tobacco: Never  Vaping Use   Vaping status: Never Used  Substance and Sexual Activity   Alcohol use: No   Drug use: No   Sexual activity: Yes    Birth control/protection: None  Other Topics Concern   Not on file  Social History Narrative   10-04 19 Unable to ask abuse questions wife with him today.   Are you right handed or left handed? Ambidextrous prominent right   Are you currently employed ? 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 no   Who lives with you? Wife and patient   What type of home do you live in: 1 story or 2 story? 1 story       Social Determinants of Health   Financial Resource Strain: Not on file  Food Insecurity: No Food Insecurity (04/03/2023)   Hunger Vital Sign    Worried About Running Out of Food in the Last Year: Never true    Ran Out of Food in the Last Year: Never true  Transportation Needs: No Transportation Needs (04/03/2023)   PRAPARE - Administrator, Civil Service (Medical): No    Lack of Transportation (Non-Medical): No  Physical Activity: Not on file  Stress: Not on file  Social Connections: Not on file  Intimate Partner Violence: Not At Risk (04/03/2023)   Humiliation, Afraid, Rape, and Kick questionnaire    Fear of Current or Ex-Partner: No    Emotionally Abused: No    Physically Abused: No    Sexually Abused: No    FAMILY HISTORY: Family History  Problem Relation Age of Onset   Breast cancer Mother    Prostate cancer Neg Hx    Kidney cancer Neg Hx    Cancer Neg Hx     ALLERGIES:  has no allergies on  file.  MEDICATIONS:  Current Outpatient Medications  Medication Sig Dispense Refill   ALPRAZolam (XANAX) 0.25 MG tablet Take 1 tablet (0.25 mg total) by mouth at bedtime. (Patient taking differently: Take 0.25 mg by mouth at bedtime as needed for anxiety.) 15 tablet 0   atorvastatin (LIPITOR) 20 MG tablet TAKE 1 TABLET BY MOUTH EVERY DAY 90 tablet 3   clopidogrel (PLAVIX) 75 MG tablet TAKE 1 TABLET BY MOUTH EVERY DAY 90 tablet 2   fluticasone (FLONASE) 50 MCG/ACT nasal spray Place  1 spray into both nostrils daily as needed for allergies.     ketoconazole (NIZORAL) 2 % cream Apply 1 application  topically 2 (two) times daily as needed for irritation.     metoprolol tartrate (LOPRESSOR) 25 MG tablet TAKE 1 TABLET (25 MG TOTAL) BY MOUTH DAILY. 90 tablet 2   oxyCODONE (ROXICODONE) 15 MG immediate release tablet Take 1 tablet (15 mg total) by mouth every 4 (four) hours as needed for severe pain. 42 tablet 0   oxyCODONE ER (XTAMPZA ER) 9 MG C12A Take 1 capsule by mouth every 12 (twelve) hours. 30 capsule 0   sennosides-docusate sodium (SENOKOT-S) 8.6-50 MG tablet Take 1 tablet by mouth daily. 60 tablet 1   sertraline (ZOLOFT) 100 MG tablet TAKE 1 TABLET BY MOUTH EVERY DAY (Patient taking differently: Take 50 mg by mouth daily.) 90 tablet 0   tamsulosin (FLOMAX) 0.4 MG CAPS capsule Take 1 capsule (0.4 mg total) by mouth daily. 30 capsule 3   Vitamin D, Ergocalciferol, (DRISDOL) 1.25 MG (50000 UNIT) CAPS capsule TAKE 1 CAPSULE BY MOUTH ONE TIME PER WEEK 12 capsule 1   pantoprazole (PROTONIX) 40 MG tablet Take 1 tablet (40 mg total) by mouth daily. (Patient not taking: Reported on 04/01/2023) 60 tablet 1   No current facility-administered medications for this visit.      Marland Kitchen  PHYSICAL EXAMINATION: ECOG PERFORMANCE STATUS: 1 - Symptomatic but completely ambulatory  Vitals:   06/14/23 0916  BP: (!) 118/100  Pulse: 91  Temp: 97.6 F (36.4 C)  SpO2: 98%       Filed Weights   06/14/23 0916   Weight: 240 lb 9.6 oz (109.1 kg)        Physical Exam Vitals and nursing note reviewed.  HENT:     Head: Normocephalic and atraumatic.     Mouth/Throat:     Pharynx: Oropharynx is clear.  Eyes:     Extraocular Movements: Extraocular movements intact.     Pupils: Pupils are equal, round, and reactive to light.  Cardiovascular:     Rate and Rhythm: Normal rate and regular rhythm.  Pulmonary:     Comments: Decreased breath sounds bilaterally.  Abdominal:     Palpations: Abdomen is soft.  Musculoskeletal:        General: Normal range of motion.     Cervical back: Normal range of motion.  Skin:    General: Skin is warm.  Neurological:     General: No focal deficit present.     Mental Status: He is alert and oriented to person, place, and time.  Psychiatric:        Behavior: Behavior normal.        Judgment: Judgment normal.      LABORATORY DATA:  I have reviewed the data as listed Lab Results  Component Value Date   WBC 6.8 06/14/2023   HGB 15.0 06/14/2023   HCT 45.6 06/14/2023   MCV 88.4 06/14/2023   PLT 241 06/14/2023   Recent Labs    02/14/23 0856 03/07/23 1254 04/03/23 1552 04/04/23 0954 06/03/23 1349 06/14/23 0908  NA 139 137  --  138  --  137  K 4.0 3.5  --  4.3  --  3.8  CL 106 105  --  103  --  106  CO2 24 22  --  26  --  22  GLUCOSE 124* 119*  --  135*  --  101*  BUN 18 17  --  18  --  15  CREATININE 1.07 0.99 1.01 1.17 0.90 0.90  CALCIUM 9.3 9.4  --  9.0  --  9.3  GFRNONAA >60 >60 >60 >60  --  >60  PROT 7.8 7.9  --   --   --  7.6  ALBUMIN 4.4 4.2  --   --   --  4.3  AST 19 20  --   --   --  22  ALT 17 19  --   --   --  23  ALKPHOS 80 66  --   --   --  85  BILITOT 0.5 0.7  --   --   --  0.5    RADIOGRAPHIC STUDIES: I have personally reviewed the radiological images as listed and agreed with the findings in the report. CT SOFT TISSUE NECK W CONTRAST  Result Date: 06/10/2023 CLINICAL DATA:  Provided history: Primary adenocarcinoma of  upper lobe of right lung. Neck mass, non-pulsatile. Neck swelling/lymph nodes. EXAM: CT NECK WITH CONTRAST TECHNIQUE: Multidetector CT imaging of the neck was performed using the standard protocol following the bolus administration of intravenous contrast. RADIATION DOSE REDUCTION: This exam was performed according to the departmental dose-optimization program which includes automated exposure control, adjustment of the mA and/or kV according to patient size and/or use of iterative reconstruction technique. CONTRAST:  OMNIPAQUE IOHEXOL 300 MG/ML  SOLN COMPARISON:  Neck CT 09/20/2021.  PET-CT 07/20/2022. FINDINGS: Pharynx and larynx: No appreciable swelling or mass within the oral cavity, pharynx or larynx. Redemonstrated small calcific foci within the palatine tonsils bilaterally, which may reflect postinflammatory calcifications and/or tonsilloliths. Salivary glands: Redemonstrated small calcific foci within the bilateral parotid glands (measuring up to 3 mm), which may reflect postinflammatory calcifications and/or salivary stones. Unremarkable appearance of the submandibular glands. Thyroid: Unremarkable. Lymph nodes: No pathologically enlarged lymph nodes are identified within the neck. Vascular: The major vascular structures of the neck are patent. Atherosclerotic plaque within the visualized aortic arch, proximal major branch vessels of the neck and carotid arteries. Limited intracranial: No evidence of an acute intracranial abnormality within the field of view. Visualized orbits: The orbits are only minimally included in the field of view. No orbital mass or acute orbital finding at the imaged levels. Mastoids and visualized paranasal sinuses: Minimal mucosal thickening scattered within the bilateral ethmoid air cells at the imaged levels. Redemonstrated large mucous retention cyst within the left maxillary sinus. Small-volume fluid within the right mastoid air cells. Skeleton: Cervical spondylosis. No  acute fracture or aggressive osseous lesion. Upper chest: Separately reported on same day CT chest/abdomen/pelvis. IMPRESSION: 1. No evidence of lymphadenopathy within the neck. 2. Paranasal sinus disease as described. 3. Small right mastoid effusion. 4. Postinflammatory calcifications and/or tonsilloliths within the palatine tonsils. 5. Small postinflammatory calcifications and/or salivary stones within the parotid glands. 6. Aortic Atherosclerosis (ICD10-I70.0). 7. Please see separately reported same day CT chest/abdomen/pelvis. Electronically Signed   By: Jackey Loge D.O.   On: 06/10/2023 09:33   CT CHEST ABDOMEN PELVIS W CONTRAST  Result Date: 06/04/2023 CLINICAL DATA:  Metastatic non-small cell lung cancer restaging, additional history of prostate cancer * Tracking Code: BO * EXAM: CT CHEST, ABDOMEN, AND PELVIS WITH CONTRAST TECHNIQUE: Multidetector CT imaging of the chest, abdomen and pelvis was performed following the standard protocol during bolus administration of intravenous contrast. RADIATION DOSE REDUCTION: This exam was performed according to the departmental dose-optimization program which includes automated exposure control, adjustment of the mA and/or kV according to patient size and/or use of  iterative reconstruction technique. CONTRAST:  OMNIPAQUE IOHEXOL 300 MG/ML  SOLN COMPARISON:  01/04/2023 FINDINGS: CT CHEST FINDINGS Cardiovascular: Aortic atherosclerosis. Normal heart size. Right coronary artery calcifications and or stents. No pericardial effusion. Mediastinum/Nodes: No enlarged mediastinal, hilar, or axillary lymph nodes. Thyroid gland, trachea, and esophagus demonstrate no significant findings. Lungs/Pleura: Unchanged, treated nodule of the right pulmonary apex measuring 1.4 x 0.8 cm. Adjacent ground-glass and interlobular septal thickening similar to prior. Minimal paraseptal emphysema. Diffuse bilateral bronchial wall thickening and background of fine centrilobular nodularity.  No pleural effusion or pneumothorax. Musculoskeletal: No chest wall abnormality. No acute osseous findings. CT ABDOMEN PELVIS FINDINGS Hepatobiliary: Unchanged, tiny treated metastasis of the right lobe of the liver, hepatic segment V, measuring 0.5 cm (series 3, image 71). No gallstones, gallbladder wall thickening, or biliary dilatation. Pancreas: Unremarkable. No pancreatic ductal dilatation or surrounding inflammatory changes. Spleen: Normal in size without significant abnormality. Adrenals/Urinary Tract: Unchanged, definitively benign bilateral adrenal adenomata, requiring no specific further follow-up or characterization. Nonobstructive calculus of the superior pole of the left kidney. The right kidney is normal, without renal calculi, solid lesion, or hydronephrosis. Bladder is unremarkable. Stomach/Bowel: Stomach is within normal limits. Appendix appears normal. No evidence of bowel wall thickening, distention, or inflammatory changes. Sigmoid diverticula. Vascular/Lymphatic: Aortic atherosclerosis. No enlarged abdominal or pelvic lymph nodes. Reproductive: Prostate brachytherapy. Other: No abdominal wall hernia or abnormality. No ascites. Musculoskeletal: No acute osseous findings. Intramedullary nail fixation of the right hip. IMPRESSION: 1. Unchanged, treated nodule of the right pulmonary apex. Adjacent post radiation change similar to prior. 2. Unchanged, tiny treated metastasis of the right lobe of the liver. No new lesions. 3. No evidence of lymphadenopathy or new metastatic disease in the chest, abdomen, or pelvis. 4. Nonobstructive left nephrolithiasis. 5. Coronary artery disease. 6. Emphysema with diffuse bilateral bronchial wall thickening and background of fine centrilobular nodularity consistent with smoking-related respiratory bronchiolitis. 7. Prostate brachytherapy. Aortic Atherosclerosis (ICD10-I70.0) and Emphysema (ICD10-J43.9). Electronically Signed   By: Jearld Lesch M.D.   On: 06/04/2023  11:04    ASSESSMENT & PLAN:   No problem-specific Assessment & Plan notes found for this encounter.   All questions were answered. The patient knows to call the clinic with any problems, questions or concerns.    Earna Coder, MD 06/14/2023 9:56 AM

## 2023-06-14 NOTE — Progress Notes (Signed)
CT results neck/CAP 06/03/23.  C/o fatigue. Taking 2 hour naps in afternoon.

## 2023-06-14 NOTE — Assessment & Plan Note (Addendum)
#   Non-small cell lung cancer/stage IV-favor adeno carcinoma-metastases to right femur/liver; synchronous squamous cell carcinoma scalp  S/P- Patient currently on maintanence Libatayo.  CT CAP- AUG, 2024-   Unchanged, treated nodule of the right pulmonary apex. Adjacent post radiation change similar to prior;  Unchanged, tiny treated metastasis of the right lobe of the liver. No new lesions. No evidence of lymphadenopathy or new metastatic disease in the chest, abdomen, or pelvis. NECK CT- NED>   # last Lucianne Lei was on April 18th- interrupted  sec to hip surgery/healing etc.   # Proceed with Single agent Libtayo maintenance- Labs today reviewed;  acceptable for treatment today.  DEC 2023- TSH-WNL. Stable.    # BONE METASTASES:  Impending right hip fracture right hip pain-status post radiation [[place]; DEC 2022]- S/p  Re-RT on 10/03 x5 Fx. NOV 2023-status post intramedullary nail fixation due to an impending femur fracture secondary  DEC 2023-bone scan no evidence of any distant bony disease; difficult to assess right femur because of recent surgery.  Awaiting dental clearance.   # Low vit D- [July 2023- vit D- 25]; on  ergocalciferol- Calcium 9.5 improved/stable. FEB vit D-25-OH- 41. Stable.   # Squamous cell carcinoma of the scalp-s/p excision; positive margins- on libtayo-no clinical evidence of progression.  Might need to consider radiation-if any local progression noted/also based on course lung cancer. Stable.   # COPD-recommend follow-up with pulmonary, [facility] [place]- Stable.    # GERD- on prilosec continue PPI BID prior to meals- Stable.   # IV access: PIV  #Incidental findings on Imaging  CT , 2024: Nonobstructive left nephrolithiasis; Coronary artery disease;  Emphysema; Prostate brachytherapy. I reviewed/discussed/counseled the patient.   # DISPOSITION: #Libtayo;- treatment as planned- 08/21.  # as per IS- follow up in 3 weeks- MD;labs- cbc/cmp; Libtayo;TSH- Dr.B

## 2023-06-15 LAB — THYROID PANEL WITH TSH
Free Thyroxine Index: 2.1 (ref 1.2–4.9)
T3 Uptake Ratio: 27 % (ref 24–39)
T4, Total: 7.8 ug/dL (ref 4.5–12.0)
TSH: 2.32 u[IU]/mL (ref 0.450–4.500)

## 2023-06-17 ENCOUNTER — Encounter: Payer: Self-pay | Admitting: Internal Medicine

## 2023-06-19 ENCOUNTER — Other Ambulatory Visit: Payer: Self-pay | Admitting: Internal Medicine

## 2023-06-19 ENCOUNTER — Inpatient Hospital Stay: Payer: BC Managed Care – PPO

## 2023-06-19 ENCOUNTER — Inpatient Hospital Stay: Payer: BC Managed Care – PPO | Admitting: Internal Medicine

## 2023-06-19 VITALS — BP 138/74 | HR 61 | Temp 97.9°F | Resp 18 | Ht 71.0 in | Wt 240.0 lb

## 2023-06-19 DIAGNOSIS — C3411 Malignant neoplasm of upper lobe, right bronchus or lung: Secondary | ICD-10-CM | POA: Diagnosis not present

## 2023-06-19 DIAGNOSIS — G893 Neoplasm related pain (acute) (chronic): Secondary | ICD-10-CM | POA: Diagnosis not present

## 2023-06-19 DIAGNOSIS — Z9221 Personal history of antineoplastic chemotherapy: Secondary | ICD-10-CM | POA: Diagnosis not present

## 2023-06-19 DIAGNOSIS — C61 Malignant neoplasm of prostate: Secondary | ICD-10-CM | POA: Diagnosis not present

## 2023-06-19 DIAGNOSIS — Z79620 Long term (current) use of immunosuppressive biologic: Secondary | ICD-10-CM | POA: Diagnosis not present

## 2023-06-19 DIAGNOSIS — I2510 Atherosclerotic heart disease of native coronary artery without angina pectoris: Secondary | ICD-10-CM | POA: Diagnosis not present

## 2023-06-19 DIAGNOSIS — F17210 Nicotine dependence, cigarettes, uncomplicated: Secondary | ICD-10-CM | POA: Diagnosis not present

## 2023-06-19 DIAGNOSIS — C787 Secondary malignant neoplasm of liver and intrahepatic bile duct: Secondary | ICD-10-CM | POA: Diagnosis not present

## 2023-06-19 DIAGNOSIS — R5383 Other fatigue: Secondary | ICD-10-CM | POA: Diagnosis not present

## 2023-06-19 DIAGNOSIS — C7951 Secondary malignant neoplasm of bone: Secondary | ICD-10-CM | POA: Diagnosis not present

## 2023-06-19 DIAGNOSIS — Z79899 Other long term (current) drug therapy: Secondary | ICD-10-CM | POA: Diagnosis not present

## 2023-06-19 LAB — COMPREHENSIVE METABOLIC PANEL
ALT: 20 U/L (ref 0–44)
AST: 19 U/L (ref 15–41)
Albumin: 4.4 g/dL (ref 3.5–5.0)
Alkaline Phosphatase: 83 U/L (ref 38–126)
Anion gap: 9 (ref 5–15)
BUN: 14 mg/dL (ref 8–23)
CO2: 23 mmol/L (ref 22–32)
Calcium: 9 mg/dL (ref 8.9–10.3)
Chloride: 105 mmol/L (ref 98–111)
Creatinine, Ser: 0.89 mg/dL (ref 0.61–1.24)
GFR, Estimated: >60 mL/min (ref >60)
Glucose, Bld: 114 mg/dL — ABNORMAL HIGH (ref 70–99)
Potassium: 3.9 mmol/L (ref 3.5–5.1)
Sodium: 137 mmol/L (ref 135–145)
Total Bilirubin: 0.3 mg/dL (ref 0.3–1.2)
Total Protein: 7.8 g/dL (ref 6.5–8.1)

## 2023-06-19 LAB — CBC WITH DIFFERENTIAL/PLATELET
Abs Immature Granulocytes: 0.02 10*3/uL (ref 0.00–0.07)
Basophils Absolute: 0.1 10*3/uL (ref 0.0–0.1)
Basophils Relative: 1 %
Eosinophils Absolute: 0.1 10*3/uL (ref 0.0–0.5)
Eosinophils Relative: 2 %
HCT: 45.7 % (ref 39.0–52.0)
Hemoglobin: 14.9 g/dL (ref 13.0–17.0)
Immature Granulocytes: 0 %
Lymphocytes Relative: 19 %
Lymphs Abs: 1.4 10*3/uL (ref 0.7–4.0)
MCH: 28.8 pg (ref 26.0–34.0)
MCHC: 32.6 g/dL (ref 30.0–36.0)
MCV: 88.4 fL (ref 80.0–100.0)
Monocytes Absolute: 0.9 10*3/uL (ref 0.1–1.0)
Monocytes Relative: 13 %
Neutro Abs: 4.7 10*3/uL (ref 1.7–7.7)
Neutrophils Relative %: 65 %
Platelets: 238 10*3/uL (ref 150–400)
RBC: 5.17 MIL/uL (ref 4.22–5.81)
RDW: 14.6 % (ref 11.5–15.5)
WBC: 7.3 10*3/uL (ref 4.0–10.5)
nRBC: 0 % (ref 0.0–0.2)

## 2023-06-19 MED ORDER — SODIUM CHLORIDE 0.9 % IV SOLN
Freq: Once | INTRAVENOUS | Status: AC
  Filled 2023-06-19: qty 250

## 2023-06-19 MED ORDER — SODIUM CHLORIDE 0.9 % IV SOLN
350.0000 mg | Freq: Once | INTRAVENOUS | Status: AC
  Administered 2023-06-19: 350 mg via INTRAVENOUS
  Filled 2023-06-19: qty 7

## 2023-06-24 ENCOUNTER — Other Ambulatory Visit: Payer: Self-pay | Admitting: Internal Medicine

## 2023-07-09 ENCOUNTER — Other Ambulatory Visit: Payer: Self-pay | Admitting: Internal Medicine

## 2023-07-09 ENCOUNTER — Telehealth: Payer: Self-pay | Admitting: *Deleted

## 2023-07-09 NOTE — Telephone Encounter (Signed)
Patient wife called wanting to postpone patient appointment tomorrow for a few weeks stating that he is having trouble with his legs and that he has an appointment with his surgeon on 9/17 and will know more about what is going on after that. Please advise, return her call

## 2023-07-10 ENCOUNTER — Inpatient Hospital Stay: Payer: BC Managed Care – PPO

## 2023-07-10 ENCOUNTER — Encounter: Payer: Self-pay | Admitting: Internal Medicine

## 2023-07-10 ENCOUNTER — Inpatient Hospital Stay: Payer: BC Managed Care – PPO | Admitting: Internal Medicine

## 2023-07-16 ENCOUNTER — Other Ambulatory Visit: Payer: Self-pay | Admitting: *Deleted

## 2023-07-16 DIAGNOSIS — C3411 Malignant neoplasm of upper lobe, right bronchus or lung: Secondary | ICD-10-CM

## 2023-07-16 DIAGNOSIS — S7221XK Displaced subtrochanteric fracture of right femur, subsequent encounter for closed fracture with nonunion: Secondary | ICD-10-CM | POA: Diagnosis not present

## 2023-07-17 ENCOUNTER — Inpatient Hospital Stay: Payer: BC Managed Care – PPO

## 2023-07-17 ENCOUNTER — Inpatient Hospital Stay: Payer: BC Managed Care – PPO | Attending: Internal Medicine | Admitting: Internal Medicine

## 2023-07-17 ENCOUNTER — Encounter: Payer: Self-pay | Admitting: Internal Medicine

## 2023-07-17 VITALS — BP 139/76 | HR 75 | Resp 18

## 2023-07-17 VITALS — BP 144/76 | HR 70 | Temp 97.4°F | Ht 71.0 in | Wt 244.4 lb

## 2023-07-17 DIAGNOSIS — J449 Chronic obstructive pulmonary disease, unspecified: Secondary | ICD-10-CM | POA: Diagnosis not present

## 2023-07-17 DIAGNOSIS — C7951 Secondary malignant neoplasm of bone: Secondary | ICD-10-CM | POA: Diagnosis not present

## 2023-07-17 DIAGNOSIS — Z923 Personal history of irradiation: Secondary | ICD-10-CM | POA: Insufficient documentation

## 2023-07-17 DIAGNOSIS — K219 Gastro-esophageal reflux disease without esophagitis: Secondary | ICD-10-CM | POA: Insufficient documentation

## 2023-07-17 DIAGNOSIS — C3411 Malignant neoplasm of upper lobe, right bronchus or lung: Secondary | ICD-10-CM

## 2023-07-17 DIAGNOSIS — C4442 Squamous cell carcinoma of skin of scalp and neck: Secondary | ICD-10-CM | POA: Diagnosis not present

## 2023-07-17 DIAGNOSIS — Z79899 Other long term (current) drug therapy: Secondary | ICD-10-CM | POA: Diagnosis not present

## 2023-07-17 DIAGNOSIS — C787 Secondary malignant neoplasm of liver and intrahepatic bile duct: Secondary | ICD-10-CM | POA: Diagnosis not present

## 2023-07-17 DIAGNOSIS — Z8546 Personal history of malignant neoplasm of prostate: Secondary | ICD-10-CM | POA: Insufficient documentation

## 2023-07-17 LAB — CBC WITH DIFFERENTIAL/PLATELET
Abs Immature Granulocytes: 0.02 10*3/uL (ref 0.00–0.07)
Basophils Absolute: 0 10*3/uL (ref 0.0–0.1)
Basophils Relative: 1 %
Eosinophils Absolute: 0.1 10*3/uL (ref 0.0–0.5)
Eosinophils Relative: 2 %
HCT: 46.7 % (ref 39.0–52.0)
Hemoglobin: 14.9 g/dL (ref 13.0–17.0)
Immature Granulocytes: 0 %
Lymphocytes Relative: 19 %
Lymphs Abs: 1.1 10*3/uL (ref 0.7–4.0)
MCH: 28.1 pg (ref 26.0–34.0)
MCHC: 31.9 g/dL (ref 30.0–36.0)
MCV: 88.1 fL (ref 80.0–100.0)
Monocytes Absolute: 0.7 10*3/uL (ref 0.1–1.0)
Monocytes Relative: 12 %
Neutro Abs: 3.8 10*3/uL (ref 1.7–7.7)
Neutrophils Relative %: 66 %
Platelets: 226 10*3/uL (ref 150–400)
RBC: 5.3 MIL/uL (ref 4.22–5.81)
RDW: 14.7 % (ref 11.5–15.5)
WBC: 5.8 10*3/uL (ref 4.0–10.5)
nRBC: 0 % (ref 0.0–0.2)

## 2023-07-17 LAB — COMPREHENSIVE METABOLIC PANEL WITH GFR
ALT: 17 U/L (ref 0–44)
AST: 18 U/L (ref 15–41)
Albumin: 4.2 g/dL (ref 3.5–5.0)
Alkaline Phosphatase: 81 U/L (ref 38–126)
Anion gap: 6 (ref 5–15)
BUN: 16 mg/dL (ref 8–23)
CO2: 23 mmol/L (ref 22–32)
Calcium: 9 mg/dL (ref 8.9–10.3)
Chloride: 108 mmol/L (ref 98–111)
Creatinine, Ser: 0.93 mg/dL (ref 0.61–1.24)
GFR, Estimated: >60 mL/min (ref >60)
Glucose, Bld: 117 mg/dL — ABNORMAL HIGH (ref 70–99)
Potassium: 3.9 mmol/L (ref 3.5–5.1)
Sodium: 137 mmol/L (ref 135–145)
Total Bilirubin: 0.5 mg/dL (ref 0.3–1.2)
Total Protein: 7.3 g/dL (ref 6.5–8.1)

## 2023-07-17 LAB — TSH: TSH: 2.546 u[IU]/mL (ref 0.350–4.500)

## 2023-07-17 MED ORDER — SODIUM CHLORIDE 0.9 % IV SOLN
350.0000 mg | Freq: Once | INTRAVENOUS | Status: AC
  Administered 2023-07-17: 350 mg via INTRAVENOUS
  Filled 2023-07-17: qty 7

## 2023-07-17 MED ORDER — SODIUM CHLORIDE 0.9 % IV SOLN
Freq: Once | INTRAVENOUS | Status: AC
  Filled 2023-07-17: qty 250

## 2023-07-17 NOTE — Assessment & Plan Note (Signed)
#   Non-small cell lung cancer/stage IV-favor adeno carcinoma-metastases to right femur/liver; synchronous squamous cell carcinoma scalp  S/P- Patient currently on maintanence Libatayo.  CT CAP- AUG, 2024-   Unchanged, treated nodule of the right pulmonary apex. Adjacent post radiation change similar to prior;  Unchanged, tiny treated metastasis of the right lobe of the liver. No new lesions. No evidence of lymphadenopathy or new metastatic disease in the chest, abdomen, or pelvis. NECK CT- NED.   # Proceed with Single agent Libtayo maintenance- Labs today reviewed;  acceptable for treatment today.  AUG 2024- TSH-WNL. Stable.    # BONE METASTASES:  Impending right hip fracture right hip pain-status post radiation [[facility]; DEC 2022]- S/p  Re-RT on 10/03 x5 Fx. NOV 2023-status post intramedullary nail fixation;JUNE 2024 [[place]] Re-surgery-    Awaiting dental clearance for zometa.Not any pain medications.   Stable.     # Low vit D- [July 2023- vit D- 25]; on  ergocalciferol- Calcium 9.5 improved/stable. FEB vit D-25-OH- 41. Stable.   # Squamous cell carcinoma of the scalp-s/p excision; positive margins- on libtayo-no clinical evidence of progression.  Might need to consider radiation-if any local progression noted/also based on course lung cancer. Stable.   # COPD-recommend follow-up with pulmonary, [facility] [place]- Stable.    # GERD- on prilosec continue PPI BID prior to meals-  Stable.    # IV access: PIV  # DISPOSITION: #Libtayo  # as per IS- follow up in 3 weeks- MD;labs- cbc/cmp; Libtayo;TSH- Dr.B

## 2023-07-17 NOTE — Progress Notes (Signed)
[facility] CONSULT NOTE  Patient Care Team: Sherrie Mustache, MD as PCP - General (Internal Medicine) Runell Gess, MD as PCP - Cardiology (Cardiology) [name], MD as Consulting Physician (Oncology) Sherrie Mustache, MD as Referring Physician (Internal Medicine) Earna Coder, MD as Consulting Physician (Internal Medicine) Earna Coder, MD as Consulting Physician (Internal Medicine)  CHIEF COMPLAINTS/PURPOSE OF CONSULTATION: lung cancer  #  Oncology History Overview Note  DIAGNOSIS: 1) stage IV (T1c, N0, M1 C) non-small cell lung cancer favoring adenocarcinoma presented with right lung apical nodule in addition to metastatic disease in the right hepatic lobe and the proximal right femoral diaphysis diagnosed and December 2022. 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    QNS; Guardant 360 showed positive KRAS G12C mutation  FINAL MICROSCOPIC DIAGNOSIS:   A. LUNG, RUL, FINE NEEDLE ASPIRATION:  - Malignant cells consistent with non-small cell carcinoma, see comment   B. LUNG, RUL, BRUSHING:  - Malignant cells consistent with non-small cell carcinoma, see comment       COMMENT:   A and B.  Dr. Luisa Hart reviewed the case and concurs with the diagnosis.  Only rare malignant cells are present on the cellblock.  Immunohistochemical stains were attempted and show that the tumor cells  have patchy staining for TTF-1 whereas p63, p40 and CK5/6 are negative.  The findings are nondiagnostic but suggestive of a lung adenocarcinoma.  Dr. Delton Coombes was notified on 10/13/2021.   SEP-OCT 2022-  [Dermatology]-   Poorly differentiated squamous cell carcinoma; -  Carcinoma extends to the edges of the excision specimen   IMPRESSION: 1. The spiculated nodule at the right lung apex on recent neck CT is hypermetabolic and is concerning for primary bronchogenic  carcinoma. Tissue sampling recommended. 2. Hypermetabolic activity within the occipital scalp and small previously demonstrated right occipital lymph node compatible with known squamous cell carcinoma. Evaluation of the head and neck limited by motion artifact. 3. Hypermetabolic lesions inferiorly in the right hepatic lobe and in the proximal right femoral diaphysis suspicious for metastatic disease, primary uncertain in this patient with a history of prostate cancer. Correlate with PSA levels. Abdominal MRI without and with contrast may be helpful for further characterization of the liver lesion.  # s/p RT to RUL [[place]]; right hip RT; # BONE METASTASES:  Impending right hip fracture right hip pain-status post radiation [[facility]; DEC 2022]- S/p  Re-RT on 10/03 x5 Fx. NOV 2023-status post intramedullary nail fixation;JUNE 2024 [[place]] Re-surgery-    Awaiting dental clearance for zometa.    # 2007MI-CAD [s/p stent-Dr.Berry; EF 2021- 58%]     Primary adenocarcinoma of upper lobe of right lung (HCC)  10/18/2021 Initial Diagnosis   Primary adenocarcinoma of upper lobe of right lung (HCC)   10/18/2021 Cancer Staging   Staging form: Lung, AJCC 8th Edition - Clinical: Stage IVB (cT1c, cN0, cM1c) - Signed by [name], MD on 10/18/2021   11/01/2021 - 05/30/2022 Chemotherapy   Patient is on Treatment Plan : LUNG NSCLC Pemetrexed + Carboplatin q21d x 4 Cycles     11/01/2021 -  Chemotherapy   Patient is on Treatment Plan : LUNG NSCLC Libtayo q  21d       HISTORY OF PRESENTING ILLNESS: Ambulating with a cane.  Alone.   Chris [name] 62 y.o.  male metastatic stage IV non-small cell lung cancer/favor adenocarcinoma-currently on maintained libatyo here for follow-up.   S/p evaluation with  orthopedics in [place] for his ongoing right hip pain- s/p revision surgery.   Doing well. Appetite is good. Breathing well.  No nausea no vomiting.  Review of Systems  Constitutional:  Positive for  malaise/fatigue. Negative for chills, diaphoresis, fever and weight loss.  HENT:  Positive for hearing loss. Negative for nosebleeds and sore throat.   Eyes:  Negative for double vision.  Respiratory:  Negative for cough, hemoptysis, sputum production, shortness of breath and wheezing.   Cardiovascular:  Negative for chest pain, palpitations, orthopnea and leg swelling.  Gastrointestinal:  Negative for abdominal pain, blood in stool, diarrhea, heartburn, melena, nausea and vomiting.  Genitourinary:  Negative for dysuria, frequency and urgency.  Musculoskeletal:  Negative for back pain.  Skin: Negative.  Negative for itching and rash.  Neurological:  Negative for dizziness, tingling, focal weakness, weakness and headaches.  Endo/Heme/Allergies:  Does not bruise/bleed easily.  Psychiatric/Behavioral:  Negative for depression. The patient does not have insomnia.      MEDICAL HISTORY:  Past Medical History:  Diagnosis Date   Anxiety    associated with medical care,  worsened by difficulty hearing, does better with wife present   Arthritis    Cancer associated pain    Cancer, metastatic to liver [facility])    Complication of anesthesia    wife states very anxious may need pre sedation   Coronary artery disease    Dyspnea    GERD (gastroesophageal reflux disease)    Hearing loss    mild per wife   Hearing loss    no hearing aids per wife   Hyperlipidemia    Hypertension    MI (myocardial infarction) (HCC) 04/17/2006   Acute inferolateral wall MI with cardiogenic shock and complete heart block   Primary adenocarcinoma of upper lobe of right lung (HCC)    Prostate cancer (HCC)    Sleep apnea    Tobacco abuse    Wears glasses    Wears partial dentures    Upper    SURGICAL HISTORY: Past Surgical History:  Procedure Laterality Date   BRONCHIAL BIOPSY  10/09/2021   Procedure: BRONCHIAL BIOPSIES;  Surgeon: Leslye Peer, MD;  Location: [facility] ENDOSCOPY;  Service: Pulmonary;;   BRONCHIAL  BRUSHINGS  10/09/2021   Procedure: BRONCHIAL BRUSHINGS;  Surgeon: Leslye Peer, MD;  Location: [facility] ENDOSCOPY;  Service: Pulmonary;;   BRONCHIAL NEEDLE ASPIRATION BIOPSY  10/09/2021   Procedure: BRONCHIAL NEEDLE ASPIRATION BIOPSIES;  Surgeon: Leslye Peer, MD;  Location: [facility] ENDOSCOPY;  Service: Pulmonary;;   CARDIAC CATHETERIZATION  2007   left, RCA 100% occluded ruptured plaque with thrombus in the proximal segment   CYST EXCISION N/A 08/30/2021   Procedure: EXCISION OF POSTERIOR SCALP CYST;  Surgeon: Berna Bue, MD;  Location: [facility] ORS;  Service: General;  Laterality: N/A;   CYSTOSCOPY N/A 07/17/2018   Procedure: Bluford Kaufmann;  Surgeon: Marcine Matar, MD;  Location: [facility];  Service: Urology;  Laterality: N/A;  no seeds in bladder per Dr Retta Diones   FEMUR IM NAIL Right 04/03/2023   Procedure: REVISION FIXATION OF RIGHT FEMUR NONUNION;  Surgeon: Roby Lofts, MD;  Location: [facility] OR;  Service: Orthopedics;  Laterality: Right;   FIDUCIAL MARKER PLACEMENT  10/09/2021   Procedure: FIDUCIAL MARKER PLACEMENT;  Surgeon: Leslye Peer, MD;  Location: [facility] ENDOSCOPY;  Service: Pulmonary;;   HAND SURGERY Right    has metal plate in arm   HARDWARE REMOVAL Right 04/03/2023   Procedure: REMOVAL OF PREVIOUS HARDWARE FEMUR;  Surgeon: Roby Lofts, MD;  Location: [facility] OR;  Service: Orthopedics;  Laterality: Right;   PROSTATE BIOPSY     RADIOACTIVE SEED IMPLANT N/A 07/17/2018   Procedure: RADIOACTIVE SEED IMPLANT/BRACHYTHERAPY IMPLANT;  Surgeon: Marcine Matar, MD;  Location: [facility];  Service: Urology;  Laterality: N/A;  77 seeds   SPACE OAR INSTILLATION N/A 07/17/2018   Procedure: SPACE OAR INSTILLATION;  Surgeon: Marcine Matar, MD;  Location: [facility];  Service: Urology;  Laterality: N/A;   VIDEO BRONCHOSCOPY WITH RADIAL ENDOBRONCHIAL ULTRASOUND  10/09/2021   Procedure: VIDEO BRONCHOSCOPY WITH RADIAL ENDOBRONCHIAL ULTRASOUND;   Surgeon: Leslye Peer, MD;  Location: [facility] ENDOSCOPY;  Service: Pulmonary;;   WRIST SURGERY  10/04/2012    SOCIAL HISTORY: Social History   Socioeconomic History   Marital status: Married    Spouse name: Not on file   Number of children: Not on file   Years of education: Not on file   Highest education level: Not on file  Occupational History   Not on file  Tobacco Use   Smoking status: Every Day    Current packs/day: 0.75    Average packs/day: 0.8 packs/day for 42.0 years (31.5 ttl pk-yrs)    Types: Cigarettes   Smokeless tobacco: Never  Vaping Use   Vaping status: Never Used  Substance and Sexual Activity   Alcohol use: No   Drug use: No   Sexual activity: Yes    Birth control/protection: None  Other Topics Concern   Not on file  Social History Narrative   10-04 19 Unable to ask abuse questions wife with him today.   Are you right handed or left handed? Ambidextrous prominent right   Are you currently employed ? 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 no   Who lives with you? Wife and patient   What type of home do you live in: 1 story or 2 story? 1 story       Social Determinants of Health   Financial Resource Strain: Not on file  Food Insecurity: No Food Insecurity (04/03/2023)   Hunger Vital Sign    Worried About Running Out of Food in the Last Year: Never true    Ran Out of Food in the Last Year: Never true  Transportation Needs: No Transportation Needs (04/03/2023)   PRAPARE - Administrator, Civil Service (Medical): No    Lack of Transportation (Non-Medical): No  Physical Activity: Not on file  Stress: Not on file  Social Connections: Not on file  Intimate Partner Violence: Not At Risk (04/03/2023)   Humiliation, Afraid, Rape, and Kick questionnaire    Fear of Current or Ex-Partner: No    Emotionally Abused: No    Physically Abused: No    Sexually Abused: No    FAMILY HISTORY: Family History   Problem Relation Age of Onset   Breast cancer Mother    Prostate cancer Neg Hx    Kidney cancer Neg Hx    Cancer Neg Hx     ALLERGIES:  has no allergies on file.  MEDICATIONS:  Current Outpatient Medications  Medication Sig Dispense Refill   ALPRAZolam (XANAX) 0.25 MG tablet Take 1 tablet (0.25 mg total) by mouth at bedtime. (Patient taking differently: Take 0.25 mg by mouth at bedtime as needed for anxiety.) 15 tablet 0   atorvastatin (LIPITOR) 20 MG tablet TAKE 1 TABLET BY MOUTH EVERY DAY 90 tablet  3   clopidogrel (PLAVIX) 75 MG tablet TAKE 1 TABLET BY MOUTH EVERY DAY 90 tablet 2   fluticasone (FLONASE) 50 MCG/ACT nasal spray Place 1 spray into both nostrils daily as needed for allergies.     ketoconazole (NIZORAL) 2 % cream Apply 1 application  topically 2 (two) times daily as needed for irritation.     metoprolol tartrate (LOPRESSOR) 25 MG tablet TAKE 1 TABLET (25 MG TOTAL) BY MOUTH DAILY. 90 tablet 2   sennosides-docusate sodium (SENOKOT-S) 8.6-50 MG tablet Take 1 tablet by mouth daily. 60 tablet 1   sertraline (ZOLOFT) 100 MG tablet TAKE 1 TABLET BY MOUTH EVERY DAY 90 tablet 0   tamsulosin (FLOMAX) 0.4 MG CAPS capsule Take 1 capsule (0.4 mg total) by mouth daily. 30 capsule 3   Vitamin D, Ergocalciferol, (DRISDOL) 1.25 MG (50000 UNIT) CAPS capsule TAKE 1 CAPSULE BY MOUTH ONE TIME PER WEEK 12 capsule 1   oxyCODONE (ROXICODONE) 15 MG immediate release tablet Take 1 tablet (15 mg total) by mouth every 4 (four) hours as needed for severe pain. (Patient not taking: Reported on 07/17/2023) 42 tablet 0   oxyCODONE ER (XTAMPZA ER) 9 MG C12A Take 1 capsule by mouth every 12 (twelve) hours. (Patient not taking: Reported on 07/17/2023) 30 capsule 0   pantoprazole (PROTONIX) 40 MG tablet Take 1 tablet (40 mg total) by mouth daily. (Patient not taking: Reported on 04/01/2023) 60 tablet 1   No current facility-administered medications for this visit.   Facility-Administered Medications Ordered in  Other Visits  Medication Dose Route Frequency Provider Last Rate Last Admin   0.9 %  sodium chloride infusion   Intravenous Once Louretta Shorten R, MD       cemiplimab-rwlc (LIBTAYO) 350 mg in sodium chloride 0.9 % 100 mL chemo infusion  350 mg Intravenous Once Louretta Shorten R, MD          .  PHYSICAL EXAMINATION: ECOG PERFORMANCE STATUS: 1 - Symptomatic but completely ambulatory  Vitals:   07/17/23 0821 07/17/23 0839  BP: (!) 156/80 (!) 144/76  Pulse: 70   Temp: (!) 97.4 F (36.3 C)   SpO2: 99%        Filed Weights   07/17/23 0821  Weight: 244 lb 6.4 oz (110.9 kg)        Physical Exam Vitals and nursing note reviewed.  HENT:     Head: Normocephalic and atraumatic.     Mouth/Throat:     Pharynx: Oropharynx is clear.  Eyes:     Extraocular Movements: Extraocular movements intact.     Pupils: Pupils are equal, round, and reactive to light.  Cardiovascular:     Rate and Rhythm: Normal rate and regular rhythm.  Pulmonary:     Comments: Decreased breath sounds bilaterally.  Abdominal:     Palpations: Abdomen is soft.  Musculoskeletal:        General: Normal range of motion.     Cervical back: Normal range of motion.  Skin:    General: Skin is warm.  Neurological:     General: No focal deficit present.     Mental Status: He is alert and oriented to person, place, and time.  Psychiatric:        Behavior: Behavior normal.        Judgment: Judgment normal.      LABORATORY DATA:  I have reviewed the data as listed Lab Results  Component Value Date   WBC 5.8 07/17/2023   HGB 14.9 07/17/2023  HCT 46.7 07/17/2023   MCV 88.1 07/17/2023   PLT 226 07/17/2023   Recent Labs    06/14/23 0908 06/19/23 1044 07/17/23 0816  NA 137 137 137  K 3.8 3.9 3.9  CL 106 105 108  CO2 22 23 23   GLUCOSE 101* 114* 117*  BUN 15 14 16   CREATININE 0.90 0.89 0.93  CALCIUM 9.3 9.0 9.0  GFRNONAA >60 >60 >60  PROT 7.6 7.8 7.3  ALBUMIN 4.3 4.4 4.2  AST 22 19 18    ALT 23 20 17   ALKPHOS 85 83 81  BILITOT 0.5 0.3 0.5    RADIOGRAPHIC STUDIES: I have personally reviewed the radiological images as listed and agreed with the findings in the report. No results found.  ASSESSMENT & PLAN:   Primary adenocarcinoma of upper lobe of right lung (HCC) # Non-small cell lung cancer/stage IV-favor adeno carcinoma-metastases to right femur/liver; synchronous squamous cell carcinoma scalp  S/P- Patient currently on maintanence Libatayo.  CT CAP- AUG, 2024-   Unchanged, treated nodule of the right pulmonary apex. Adjacent post radiation change similar to prior;  Unchanged, tiny treated metastasis of the right lobe of the liver. No new lesions. No evidence of lymphadenopathy or new metastatic disease in the chest, abdomen, or pelvis. NECK CT- NED.   # Proceed with Single agent Libtayo maintenance- Labs today reviewed;  acceptable for treatment today.  AUG 2024- TSH-WNL. Stable.    # BONE METASTASES:  Impending right hip fracture right hip pain-status post radiation [[facility]; DEC 2022]- S/p  Re-RT on 10/03 x5 Fx. NOV 2023-status post intramedullary nail fixation;JUNE 2024 [[place]] Re-surgery-    Awaiting dental clearance for zometa.Not any pain medications.   Stable.     # Low vit D- [July 2023- vit D- 25]; on  ergocalciferol- Calcium 9.5 improved/stable. FEB vit D-25-OH- 41. Stable.   # Squamous cell carcinoma of the scalp-s/p excision; positive margins- on libtayo-no clinical evidence of progression.  Might need to consider radiation-if any local progression noted/also based on course lung cancer. Stable.   # COPD-recommend follow-up with pulmonary, [facility] [place]- Stable.    # GERD- on prilosec continue PPI BID prior to meals-  Stable.    # IV access: PIV  # DISPOSITION: #Libtayo  # as per IS- follow up in 3 weeks- MD;labs- cbc/cmp; Libtayo;TSH- Dr.B    All questions were answered. The patient knows to call the clinic with any problems, questions or concerns.     Earna Coder, MD 07/17/2023 9:23 AM

## 2023-07-17 NOTE — Patient Instructions (Signed)
[facility]  Discharge Instructions: Thank you for choosing [facility] to provide your oncology and hematology care.  If you have a lab appointment with the Cancer Center, please go directly to the Cancer Center and check in at the registration area.  Wear comfortable clothing and clothing appropriate for easy access to any Portacath or PICC line.   We strive to give you quality time with your provider. You may need to reschedule your appointment if you arrive late (15 or more minutes).  Arriving late affects you and other patients whose appointments are after yours.  Also, if you miss three or more appointments without notifying the office, you may be dismissed from the clinic at the provider's discretion.      For prescription refill requests, have your pharmacy contact our office and allow 72 hours for refills to be completed.    Today you received the following chemotherapy and/or immunotherapy agents LIBTAYO      To help prevent nausea and vomiting after your treatment, we encourage you to take your nausea medication as directed.  BELOW ARE SYMPTOMS THAT SHOULD BE REPORTED IMMEDIATELY: *FEVER GREATER THAN 100.4 F (38 C) OR HIGHER *CHILLS OR SWEATING *NAUSEA AND VOMITING THAT IS NOT CONTROLLED WITH YOUR NAUSEA MEDICATION *UNUSUAL SHORTNESS OF BREATH *UNUSUAL BRUISING OR BLEEDING *URINARY PROBLEMS (pain or burning when urinating, or frequent urination) *BOWEL PROBLEMS (unusual diarrhea, constipation, pain near the anus) TENDERNESS IN MOUTH AND THROAT WITH OR WITHOUT PRESENCE OF ULCERS (sore throat, sores in mouth, or a toothache) UNUSUAL RASH, SWELLING OR PAIN  UNUSUAL VAGINAL DISCHARGE OR ITCHING   Items with * indicate a potential emergency and should be followed up as soon as possible or go to the Emergency Department if any problems should occur.  Please show the CHEMOTHERAPY ALERT CARD or IMMUNOTHERAPY ALERT CARD at check-in to  the Emergency Department and triage nurse.  Should you have questions after your visit or need to cancel or reschedule your appointment, please contact [facility]  [phone] and follow the prompts.  Office hours are 8:00 a.m. to 4:30 p.m. Monday - Friday. Please note that voicemails left after 4:00 p.m. may not be returned until the following business day.  We are closed weekends and major holidays. You have access to a nurse at all times for urgent questions. Please call the main number to the clinic [phone] and follow the prompts.  For any non-urgent questions, you may also contact your provider using MyChart. We now offer e-Visits for anyone 69 and older to request care online for non-urgent symptoms. For details visit [email or web address].   Also download the MyChart app! Go to the app store, search "MyChart", open the app, select [facility], and log in with your MyChart username and password.  Cemiplimab Injection What is this medication? CEMIPLIMAB (se MIP li mab) treats skin cancer and lung cancer. It works by helping your immune system slow or stop the spread of cancer cells. It is a monoclonal antibody. This medicine may be used for other purposes; ask your health care provider or pharmacist if you have questions. COMMON BRAND NAME(S): LIBTAYO What should I tell my care team before I take this medication? They need to know if you have any of these conditions: Allogeneic stem cell transplant (uses someone else's stem cells) Autoimmune diseases, such as Crohn disease, ulcerative colitis, lupus History of chest radiation Nervous system problems, such as Guillain-Barre syndrome or myasthenia  gravis Organ transplant An unusual or allergic reaction to cemiplimab, other medications, foods, dyes, or preservatives Pregnant or trying to get pregnant Breastfeeding How should I use this medication? This medication is infused into a vein. It is given  by your care team in a hospital or clinic setting. A special MedGuide will be given to you before each treatment. Be sure to read this information carefully each time. Talk to your care team about the use of this medication in children. Special care may be needed. Overdosage: If you think you have taken too much of this medicine contact a poison control center or emergency room at once. NOTE: This medicine is only for you. Do not share this medicine with others. What if I miss a dose? Keep appointments for follow-up doses. It is important not to miss your dose. Call your care team if you are unable to keep an appointment. What may interact with this medication? Interactions have not been studied. This list may not describe all possible interactions. Give your health care provider a list of all the medicines, herbs, non-prescription drugs, or dietary supplements you use. Also tell them if you smoke, drink alcohol, or use illegal drugs. Some items may interact with your medicine. What should I watch for while using this medication? Visit your care team for regular checks on your progress. It may be some time before you see the benefit from this medication. You may need blood work done while taking this medication. This medication may cause serious skin reactions. They can happen weeks to months after starting the medication. Contact your care team right away if you notice fevers or flu-like symptoms with a rash. The rash may be red or purple and then turn into blisters or peeling of the skin. You may also notice a red rash with swelling of the face, lips, or lymph nodes in your neck or under your arms. Tell your care team right away if you have any change in your eyesight. Talk to your care team if you may be pregnant. You will need a negative pregnancy test before starting this medication. Contraception is recommended while taking this medication and for 4 months after the last dose. Your care team can  help you find the option that works for you. Do not breastfeed while taking this medication and for at least 4 months after the last dose. What side effects may I notice from receiving this medication? Side effects that you should report to your care team as soon as possible: Allergic reactions--skin rash, itching, hives, swelling of the face, lips, tongue, or throat Dry cough, shortness of breath or trouble breathing Eye pain, redness, irritation, or discharge with blurry or decreased vision Heart muscle inflammation--unusual weakness or fatigue, shortness of breath, chest pain, fast or irregular heartbeat, dizziness, swelling of the ankles, feet, or hands Hormone gland problems--headache, sensitivity to light, unusual weakness or fatigue, dizziness, fast or irregular heartbeat, increased sensitivity to cold or heat, excessive sweating, constipation, hair loss, increased thirst or amount of urine, tremors or shaking, irritability Infusion reactions--chest pain, shortness of breath or trouble breathing, feeling faint or lightheaded Kidney injury (glomerulonephritis)--decrease in the amount of urine, red or dark brown urine, foamy or bubbly urine, swelling of the ankles, hands, or feet Liver injury--right upper belly pain, loss of appetite, nausea, light-colored stool, dark yellow or brown urine, yellowing skin or eyes, unusual weakness or fatigue Pain, tingling, or numbness in the hands or feet, muscle weakness, change in vision, confusion or  trouble speaking, loss of balance or coordination, trouble walking, seizures Rash, fever, and swollen lymph nodes Redness, blistering, peeling, or loosening of the skin, including inside the mouth Sudden or severe stomach pain, bloody diarrhea, fever, nausea, vomiting Side effects that usually do not require medical attention (report these to your care team if they continue or are bothersome): Bone, joint, or muscle pain Diarrhea Fatigue Loss of  appetite Nausea Skin rash This list may not describe all possible side effects. Call your doctor for medical advice about side effects. You may report side effects to FDA at 1-800-FDA-1088. Where should I keep my medication? This medication is given in a hospital or clinic and will not be stored at home. NOTE: This sheet is a summary. It may not cover all possible information. If you have questions about this medicine, talk to your doctor, pharmacist, or health care provider.  2024 Elsevier/Gold Standard (2022-12-03 00:00:00)

## 2023-07-17 NOTE — Progress Notes (Signed)
No question or concerns today.

## 2023-08-01 DIAGNOSIS — H0288B Meibomian gland dysfunction left eye, upper and lower eyelids: Secondary | ICD-10-CM | POA: Diagnosis not present

## 2023-08-01 DIAGNOSIS — H40013 Open angle with borderline findings, low risk, bilateral: Secondary | ICD-10-CM | POA: Diagnosis not present

## 2023-08-01 DIAGNOSIS — H2513 Age-related nuclear cataract, bilateral: Secondary | ICD-10-CM | POA: Diagnosis not present

## 2023-08-01 DIAGNOSIS — H0288A Meibomian gland dysfunction right eye, upper and lower eyelids: Secondary | ICD-10-CM | POA: Diagnosis not present

## 2023-08-01 DIAGNOSIS — H524 Presbyopia: Secondary | ICD-10-CM | POA: Diagnosis not present

## 2023-08-06 ENCOUNTER — Encounter: Payer: Self-pay | Admitting: Hospice and Palliative Medicine

## 2023-08-06 ENCOUNTER — Encounter: Payer: Self-pay | Admitting: Cardiovascular Disease

## 2023-08-07 ENCOUNTER — Inpatient Hospital Stay: Payer: BC Managed Care – PPO

## 2023-08-07 ENCOUNTER — Inpatient Hospital Stay: Payer: BC Managed Care – PPO | Attending: Internal Medicine

## 2023-08-07 ENCOUNTER — Inpatient Hospital Stay (HOSPITAL_BASED_OUTPATIENT_CLINIC_OR_DEPARTMENT_OTHER): Payer: BC Managed Care – PPO | Admitting: Hospice and Palliative Medicine

## 2023-08-07 ENCOUNTER — Encounter: Payer: Self-pay | Admitting: Hospice and Palliative Medicine

## 2023-08-07 ENCOUNTER — Inpatient Hospital Stay (HOSPITAL_BASED_OUTPATIENT_CLINIC_OR_DEPARTMENT_OTHER): Payer: BC Managed Care – PPO | Admitting: Internal Medicine

## 2023-08-07 VITALS — BP 127/77 | HR 65 | Temp 97.5°F | Resp 18 | Wt 244.8 lb

## 2023-08-07 VITALS — BP 149/97 | HR 70 | Temp 97.0°F | Resp 20

## 2023-08-07 DIAGNOSIS — K219 Gastro-esophageal reflux disease without esophagitis: Secondary | ICD-10-CM | POA: Diagnosis not present

## 2023-08-07 DIAGNOSIS — C4442 Squamous cell carcinoma of skin of scalp and neck: Secondary | ICD-10-CM | POA: Diagnosis not present

## 2023-08-07 DIAGNOSIS — F17210 Nicotine dependence, cigarettes, uncomplicated: Secondary | ICD-10-CM | POA: Insufficient documentation

## 2023-08-07 DIAGNOSIS — I951 Orthostatic hypotension: Secondary | ICD-10-CM | POA: Insufficient documentation

## 2023-08-07 DIAGNOSIS — R5383 Other fatigue: Secondary | ICD-10-CM | POA: Insufficient documentation

## 2023-08-07 DIAGNOSIS — Z515 Encounter for palliative care: Secondary | ICD-10-CM | POA: Diagnosis not present

## 2023-08-07 DIAGNOSIS — R42 Dizziness and giddiness: Secondary | ICD-10-CM | POA: Insufficient documentation

## 2023-08-07 DIAGNOSIS — C787 Secondary malignant neoplasm of liver and intrahepatic bile duct: Secondary | ICD-10-CM | POA: Insufficient documentation

## 2023-08-07 DIAGNOSIS — C7951 Secondary malignant neoplasm of bone: Secondary | ICD-10-CM | POA: Insufficient documentation

## 2023-08-07 DIAGNOSIS — C3411 Malignant neoplasm of upper lobe, right bronchus or lung: Secondary | ICD-10-CM

## 2023-08-07 DIAGNOSIS — J449 Chronic obstructive pulmonary disease, unspecified: Secondary | ICD-10-CM | POA: Diagnosis not present

## 2023-08-07 DIAGNOSIS — G893 Neoplasm related pain (acute) (chronic): Secondary | ICD-10-CM

## 2023-08-07 LAB — CBC WITH DIFFERENTIAL/PLATELET
Abs Immature Granulocytes: 0.03 10*3/uL (ref 0.00–0.07)
Basophils Absolute: 0 10*3/uL (ref 0.0–0.1)
Basophils Relative: 0 %
Eosinophils Absolute: 0.1 10*3/uL (ref 0.0–0.5)
Eosinophils Relative: 2 %
HCT: 46.6 % (ref 39.0–52.0)
Hemoglobin: 15.2 g/dL (ref 13.0–17.0)
Immature Granulocytes: 1 %
Lymphocytes Relative: 17 %
Lymphs Abs: 1 10*3/uL (ref 0.7–4.0)
MCH: 28.6 pg (ref 26.0–34.0)
MCHC: 32.6 g/dL (ref 30.0–36.0)
MCV: 87.6 fL (ref 80.0–100.0)
Monocytes Absolute: 0.6 10*3/uL (ref 0.1–1.0)
Monocytes Relative: 9 %
Neutro Abs: 4.4 10*3/uL (ref 1.7–7.7)
Neutrophils Relative %: 71 %
Platelets: 220 10*3/uL (ref 150–400)
RBC: 5.32 MIL/uL (ref 4.22–5.81)
RDW: 15 % (ref 11.5–15.5)
WBC: 6.2 10*3/uL (ref 4.0–10.5)
nRBC: 0 % (ref 0.0–0.2)

## 2023-08-07 LAB — COMPREHENSIVE METABOLIC PANEL
ALT: 19 U/L (ref 0–44)
AST: 17 U/L (ref 15–41)
Albumin: 4 g/dL (ref 3.5–5.0)
Alkaline Phosphatase: 83 U/L (ref 38–126)
Anion gap: 8 (ref 5–15)
BUN: 18 mg/dL (ref 8–23)
CO2: 25 mmol/L (ref 22–32)
Calcium: 9 mg/dL (ref 8.9–10.3)
Chloride: 106 mmol/L (ref 98–111)
Creatinine, Ser: 1.06 mg/dL (ref 0.61–1.24)
GFR, Estimated: >60 mL/min (ref >60)
Glucose, Bld: 124 mg/dL — ABNORMAL HIGH (ref 70–99)
Potassium: 3.9 mmol/L (ref 3.5–5.1)
Sodium: 139 mmol/L (ref 135–145)
Total Bilirubin: 0.5 mg/dL (ref 0.3–1.2)
Total Protein: 7.3 g/dL (ref 6.5–8.1)

## 2023-08-07 LAB — TSH: TSH: 2.18 u[IU]/mL (ref 0.350–4.500)

## 2023-08-07 MED ORDER — XTAMPZA ER 9 MG PO C12A: 1.0000 | EXTENDED_RELEASE_CAPSULE | Freq: Two times a day (BID) | ORAL | 0 refills | Status: DC

## 2023-08-07 MED ORDER — METHYLPREDNISOLONE 4 MG PO TBPK: ORAL_TABLET | ORAL | 1 refills | Status: DC

## 2023-08-07 MED ORDER — SODIUM CHLORIDE 0.9 % IV SOLN
Freq: Once | INTRAVENOUS | Status: AC
  Filled 2023-08-07: qty 250

## 2023-08-07 MED ORDER — SODIUM CHLORIDE 0.9 % IV SOLN: 4.0000 mg | Freq: Once | INTRAVENOUS | Status: DC

## 2023-08-07 MED ORDER — OXYCODONE HCL 10 MG PO TABS: 5.0000 mg | ORAL_TABLET | ORAL | 0 refills | Status: DC | PRN

## 2023-08-07 MED ORDER — DEXAMETHASONE SODIUM PHOSPHATE 10 MG/ML IJ SOLN
4.0000 mg | Freq: Once | INTRAMUSCULAR | Status: AC
  Administered 2023-08-07: 4 mg via INTRAVENOUS
  Filled 2023-08-07: qty 1

## 2023-08-07 NOTE — Patient Instructions (Signed)
HOLD metroprolol

## 2023-08-07 NOTE — Progress Notes (Signed)
Palliative Medicine [facility] at [facility] [phone] [phone]) [phone]   Name: Chris [name] Date: 08/07/2023 MRN: [id number]  DOB: [birth year]  Patient Care Team: Sherrie Mustache, MD as PCP - General (Internal Medicine) Runell Gess, MD as PCP - Cardiology (Cardiology) [name], MD as Consulting Physician (Oncology) Sherrie Mustache, MD as Referring Physician (Internal Medicine) Earna Coder, MD as Consulting Physician (Internal Medicine) Earna Coder, MD as Consulting Physician (Internal Medicine)    REASON FOR CONSULTATION: Chris [name] is a 62 y.o. male with multiple medical problems including stage IV non-small cell lung cancer with metastasis to femur/liver diagnosed in December 2022.  Patient is status post systemic chemotherapy and most recently on maintenance single agent Libtayo. He is status post right hip XRT.  Patient was found to have impending pathologic fracture of the right femur and underwent surgical pinning at [facility] by Ortho oncology.  SOCIAL HISTORY:     reports that he has been smoking cigarettes. He has a 31.5 pack-year smoking history. He has never used smokeless tobacco. He reports that he does not drink alcohol and does not use drugs.  Patient is married and lives at home with his wife.  They have a son and recently had grandchildren.  Patient works in [employer].  ADVANCE DIRECTIVES:  Not on file  CODE STATUS: Limited Code (CPR but no intubation, MOST form signed on 04/04/22)  PAST MEDICAL HISTORY: Past Medical History:  Diagnosis Date   Anxiety    associated with medical care,  worsened by difficulty hearing, does better with wife present   Arthritis    Cancer associated pain    Cancer, metastatic to liver [facility])    Complication of anesthesia    wife states very anxious may need pre sedation   Coronary artery disease    Dyspnea    GERD (gastroesophageal reflux  disease)    Hearing loss    mild per wife   Hearing loss    no hearing aids per wife   Hyperlipidemia    Hypertension    MI (myocardial infarction) (HCC) 04/17/2006   Acute inferolateral wall MI with cardiogenic shock and complete heart block   Primary adenocarcinoma of upper lobe of right lung (HCC)    Prostate cancer (HCC)    Sleep apnea    Tobacco abuse    Wears glasses    Wears partial dentures    Upper    PAST SURGICAL HISTORY:  Past Surgical History:  Procedure Laterality Date   BRONCHIAL BIOPSY  10/09/2021   Procedure: BRONCHIAL BIOPSIES;  Surgeon: Leslye Peer, MD;  Location: [facility] ENDOSCOPY;  Service: Pulmonary;;   BRONCHIAL BRUSHINGS  10/09/2021   Procedure: BRONCHIAL BRUSHINGS;  Surgeon: Leslye Peer, MD;  Location: [facility] ENDOSCOPY;  Service: Pulmonary;;   BRONCHIAL NEEDLE ASPIRATION BIOPSY  10/09/2021   Procedure: BRONCHIAL NEEDLE ASPIRATION BIOPSIES;  Surgeon: Leslye Peer, MD;  Location: [facility] ENDOSCOPY;  Service: Pulmonary;;   CARDIAC CATHETERIZATION  2007   left, RCA 100% occluded ruptured plaque with thrombus in the proximal segment   CYST EXCISION N/A 08/30/2021   Procedure: EXCISION OF POSTERIOR SCALP CYST;  Surgeon: Berna Bue, MD;  Location: [facility] ORS;  Service: General;  Laterality: N/A;   CYSTOSCOPY N/A 07/17/2018   Procedure: Bluford Kaufmann;  Surgeon: Marcine Matar, MD;  Location: [facility];  Service: Urology;  Laterality: N/A;  no seeds in bladder per Dr Retta Diones   FEMUR IM NAIL  Right 04/03/2023   Procedure: REVISION FIXATION OF RIGHT FEMUR NONUNION;  Surgeon: Roby Lofts, MD;  Location: [facility] OR;  Service: Orthopedics;  Laterality: Right;   FIDUCIAL MARKER PLACEMENT  10/09/2021   Procedure: FIDUCIAL MARKER PLACEMENT;  Surgeon: Leslye Peer, MD;  Location: [facility] ENDOSCOPY;  Service: Pulmonary;;   HAND SURGERY Right    has metal plate in arm   HARDWARE REMOVAL Right 04/03/2023   Procedure: REMOVAL OF PREVIOUS HARDWARE FEMUR;   Surgeon: Roby Lofts, MD;  Location: [facility] OR;  Service: Orthopedics;  Laterality: Right;   PROSTATE BIOPSY     RADIOACTIVE SEED IMPLANT N/A 07/17/2018   Procedure: RADIOACTIVE SEED IMPLANT/BRACHYTHERAPY IMPLANT;  Surgeon: Marcine Matar, MD;  Location: [facility];  Service: Urology;  Laterality: N/A;  77 seeds   SPACE OAR INSTILLATION N/A 07/17/2018   Procedure: SPACE OAR INSTILLATION;  Surgeon: Marcine Matar, MD;  Location: [facility];  Service: Urology;  Laterality: N/A;   VIDEO BRONCHOSCOPY WITH RADIAL ENDOBRONCHIAL ULTRASOUND  10/09/2021   Procedure: VIDEO BRONCHOSCOPY WITH RADIAL ENDOBRONCHIAL ULTRASOUND;  Surgeon: Leslye Peer, MD;  Location: [facility] ENDOSCOPY;  Service: Pulmonary;;   WRIST SURGERY  10/04/2012    HEMATOLOGY/ONCOLOGY HISTORY:  Oncology History Overview Note  DIAGNOSIS: 1) stage IV (T1c, N0, M1 C) non-small cell lung cancer favoring adenocarcinoma presented with right lung apical nodule in addition to metastatic disease in the right hepatic lobe and the proximal right femoral diaphysis diagnosed and December 2022. 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    QNS; Guardant 360 showed positive KRAS G12C mutation  FINAL MICROSCOPIC DIAGNOSIS:   A. LUNG, RUL, FINE NEEDLE ASPIRATION:  - Malignant cells consistent with non-small cell carcinoma, see comment   B. LUNG, RUL, BRUSHING:  - Malignant cells consistent with non-small cell carcinoma, see comment       COMMENT:   A and B.  Dr. Luisa Hart reviewed the case and concurs with the diagnosis.  Only rare malignant cells are present on the cellblock.  Immunohistochemical stains were attempted and show that the tumor cells  have patchy staining for TTF-1 whereas p63, p40 and CK5/6 are negative.  The findings are nondiagnostic but suggestive of a lung adenocarcinoma.  Dr. Delton Coombes was notified  on 10/13/2021.   SEP-OCT 2022-  [Dermatology]-   Poorly differentiated squamous cell carcinoma; -  Carcinoma extends to the edges of the excision specimen   IMPRESSION: 1. The spiculated nodule at the right lung apex on recent neck CT is hypermetabolic and is concerning for primary bronchogenic carcinoma. Tissue sampling recommended. 2. Hypermetabolic activity within the occipital scalp and small previously demonstrated right occipital lymph node compatible with known squamous cell carcinoma. Evaluation of the head and neck limited by motion artifact. 3. Hypermetabolic lesions inferiorly in the right hepatic lobe and in the proximal right femoral diaphysis suspicious for metastatic disease, primary uncertain in this patient with a history of prostate cancer. Correlate with PSA levels. Abdominal MRI without and with contrast may be helpful for further characterization of the liver lesion.  # s/p RT to RUL [[place]]; right hip RT; # BONE METASTASES:  Impending right hip fracture right hip pain-status post radiation [[facility]; DEC 2022]- S/p  Re-RT on 10/03 x5 Fx. NOV 2023-status post intramedullary nail fixation;JUNE 2024 [[place]] Re-surgery-    Awaiting dental clearance for zometa.    # 2007MI-CAD [s/p stent-Dr.Berry; EF 2021- 58%]     Primary  adenocarcinoma of upper lobe of right lung (HCC)  10/18/2021 Initial Diagnosis   Primary adenocarcinoma of upper lobe of right lung (HCC)   10/18/2021 Cancer Staging   Staging form: Lung, AJCC 8th Edition - Clinical: Stage IVB (cT1c, cN0, cM1c) - Signed by [name], MD on 10/18/2021   11/01/2021 - 05/30/2022 Chemotherapy   Patient is on Treatment Plan : LUNG NSCLC Pemetrexed + Carboplatin q21d x 4 Cycles     11/01/2021 -  Chemotherapy   Patient is on Treatment Plan : LUNG NSCLC Libtayo q  21d        ALLERGIES:  has no allergies on file.  MEDICATIONS:  Current Outpatient Medications  Medication Sig Dispense Refill   ALPRAZolam (XANAX) 0.25  MG tablet Take 1 tablet (0.25 mg total) by mouth at bedtime. (Patient taking differently: Take 0.25 mg by mouth at bedtime as needed for anxiety.) 15 tablet 0   atorvastatin (LIPITOR) 20 MG tablet TAKE 1 TABLET BY MOUTH EVERY DAY 90 tablet 3   clopidogrel (PLAVIX) 75 MG tablet TAKE 1 TABLET BY MOUTH EVERY DAY 90 tablet 2   fluticasone (FLONASE) 50 MCG/ACT nasal spray Place 1 spray into both nostrils daily as needed for allergies.     ketoconazole (NIZORAL) 2 % cream Apply 1 application  topically 2 (two) times daily as needed for irritation.     metoprolol tartrate (LOPRESSOR) 25 MG tablet TAKE 1 TABLET (25 MG TOTAL) BY MOUTH DAILY. 90 tablet 2   oxyCODONE (ROXICODONE) 15 MG immediate release tablet Take 1 tablet (15 mg total) by mouth every 4 (four) hours as needed for severe pain. 42 tablet 0   oxyCODONE ER (XTAMPZA ER) 9 MG C12A Take 1 capsule by mouth every 12 (twelve) hours. 30 capsule 0   pantoprazole (PROTONIX) 40 MG tablet Take 1 tablet (40 mg total) by mouth daily. 60 tablet 1   sennosides-docusate sodium (SENOKOT-S) 8.6-50 MG tablet Take 1 tablet by mouth daily. 60 tablet 1   sertraline (ZOLOFT) 100 MG tablet TAKE 1 TABLET BY MOUTH EVERY DAY 90 tablet 0   tamsulosin (FLOMAX) 0.4 MG CAPS capsule Take 1 capsule (0.4 mg total) by mouth daily. 30 capsule 3   Vitamin D, Ergocalciferol, (DRISDOL) 1.25 MG (50000 UNIT) CAPS capsule TAKE 1 CAPSULE BY MOUTH ONE TIME PER WEEK 12 capsule 1   No current facility-administered medications for this visit.    VITAL SIGNS: There were no vitals taken for this visit. There were no vitals filed for this visit.  Estimated body mass index is 34.14 kg/m as calculated from the following:   Height as of 07/17/23: 5\' 11"  (1.803 m).   Weight as of an earlier encounter on 08/07/23: 244 lb 12.8 oz (111 kg).  LABS: CBC:    Component Value Date/Time   WBC 6.2 08/07/2023 0842   HGB 15.2 08/07/2023 0842   HGB 15.0 06/14/2023 0908   HCT 46.6 08/07/2023 0842    PLT 220 08/07/2023 0842   PLT 241 06/14/2023 0908   MCV 87.6 08/07/2023 0842   NEUTROABS 4.4 08/07/2023 0842   LYMPHSABS 1.0 08/07/2023 0842   MONOABS 0.6 08/07/2023 0842   EOSABS 0.1 08/07/2023 0842   BASOSABS 0.0 08/07/2023 0842   Comprehensive Metabolic Panel:    Component Value Date/Time   NA 139 08/07/2023 0842   K 3.9 08/07/2023 0842   CL 106 08/07/2023 0842   CO2 25 08/07/2023 0842   BUN 18 08/07/2023 0842   CREATININE 1.06 08/07/2023 4132  CREATININE 0.90 06/14/2023 0908   GLUCOSE 124 (H) 08/07/2023 0842   CALCIUM 9.0 08/07/2023 0842   AST 17 08/07/2023 0842   AST 22 06/14/2023 0908   ALT 19 08/07/2023 0842   ALT 23 06/14/2023 0908   ALKPHOS 83 08/07/2023 0842   BILITOT 0.5 08/07/2023 0842   BILITOT 0.5 06/14/2023 0908   PROT 7.3 08/07/2023 0842   ALBUMIN 4.0 08/07/2023 0842    RADIOGRAPHIC STUDIES: No results found.  PERFORMANCE STATUS (ECOG) : 1 - Symptomatic but completely ambulatory  Review of Systems Unless otherwise noted, a complete review of systems is negative.  Physical Exam General: NAD Pulmonary: Unlabored Extremities: no edema, no joint deformities Skin: Scaly, erythematous rash beneath pannus Neurological: Weakness but otherwise nonfocal  IMPRESSION: Patient was an add-on today to address pain management.  Patient says that he recently restarted Xtampza ER at bedtime.  That has helped improve his pain he is experiencing his back and hip.  However, he has had some constipation.  We discussed bowel regimen in detail today with recommendation for MiraLAX/senna.  Will refill Xtampza ER and oxycodone.  Recommend Xtampza ER every 12 hours for better pain control.  Wife says the patient has had poor oral intake over the past few days with some dizziness when he stands.  Noted to be orthostatic.  Would benefit from IV fluids today.  PLAN: -Continue current scope of treatment -Refill Xtampza ER/oxycodone -Recommend IV fluids -Follow-up telephone  visit 1-2 months  Case and plan discussed with Dr. Donneta Romberg   Patient expressed understanding and was in agreement with this plan. He also understands that He can call the clinic at any time with any questions, concerns, or complaints.     Time Total: 20 minutes  Visit consisted of counseling and education dealing with the complex and emotionally intense issues of symptom management and palliative care in the setting of serious and potentially life-threatening illness.Greater than 50%  of this time was spent counseling and coordinating care related to the above assessment and plan.  Signed by: Laurette Schimke, PhD, NP-C

## 2023-08-07 NOTE — Progress Notes (Signed)
[facility] CONSULT NOTE  Patient Care Team: Sherrie Mustache, MD as PCP - General (Internal Medicine) Runell Gess, MD as PCP - Cardiology (Cardiology) [name], MD as Consulting Physician (Oncology) Sherrie Mustache, MD as Referring Physician (Internal Medicine) Earna Coder, MD as Consulting Physician (Internal Medicine) Earna Coder, MD as Consulting Physician (Internal Medicine)  CHIEF COMPLAINTS/PURPOSE OF CONSULTATION: lung cancer  #  Oncology History Overview Note  DIAGNOSIS: 1) stage IV (T1c, N0, M1 C) non-small cell lung cancer favoring adenocarcinoma presented with right lung apical nodule in addition to metastatic disease in the right hepatic lobe and the proximal right femoral diaphysis diagnosed and December 2022. 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    QNS; Guardant 360 showed positive KRAS G12C mutation  FINAL MICROSCOPIC DIAGNOSIS:   A. LUNG, RUL, FINE NEEDLE ASPIRATION:  - Malignant cells consistent with non-small cell carcinoma, see comment   B. LUNG, RUL, BRUSHING:  - Malignant cells consistent with non-small cell carcinoma, see comment       COMMENT:   A and B.  Dr. Luisa Hart reviewed the case and concurs with the diagnosis.  Only rare malignant cells are present on the cellblock.  Immunohistochemical stains were attempted and show that the tumor cells  have patchy staining for TTF-1 whereas p63, p40 and CK5/6 are negative.  The findings are nondiagnostic but suggestive of a lung adenocarcinoma.  Dr. Delton Coombes was notified on 10/13/2021.   SEP-OCT 2022-  [Dermatology]-   Poorly differentiated squamous cell carcinoma; -  Carcinoma extends to the edges of the excision specimen   IMPRESSION: 1. The spiculated nodule at the right lung apex on recent neck CT is hypermetabolic and is concerning for primary bronchogenic  carcinoma. Tissue sampling recommended. 2. Hypermetabolic activity within the occipital scalp and small previously demonstrated right occipital lymph node compatible with known squamous cell carcinoma. Evaluation of the head and neck limited by motion artifact. 3. Hypermetabolic lesions inferiorly in the right hepatic lobe and in the proximal right femoral diaphysis suspicious for metastatic disease, primary uncertain in this patient with a history of prostate cancer. Correlate with PSA levels. Abdominal MRI without and with contrast may be helpful for further characterization of the liver lesion.  # s/p RT to RUL [[place]]; right hip RT; # BONE METASTASES:  Impending right hip fracture right hip pain-status post radiation [[facility]; DEC 2022]- S/p  Re-RT on 10/03 x5 Fx. NOV 2023-status post intramedullary nail fixation;JUNE 2024 [[place]] Re-surgery-    Awaiting dental clearance for zometa.    # 2007MI-CAD [s/p stent-Dr.Berry; EF 2021- 58%]     Primary adenocarcinoma of upper lobe of right lung (HCC)  10/18/2021 Initial Diagnosis   Primary adenocarcinoma of upper lobe of right lung (HCC)   10/18/2021 Cancer Staging   Staging form: Lung, AJCC 8th Edition - Clinical: Stage IVB (cT1c, cN0, cM1c) - Signed by [name], MD on 10/18/2021   11/01/2021 - 05/30/2022 Chemotherapy   Patient is on Treatment Plan : LUNG NSCLC Pemetrexed + Carboplatin q21d x 4 Cycles     11/01/2021 -  Chemotherapy   Patient is on Treatment Plan : LUNG NSCLC Libtayo q  21d       HISTORY OF PRESENTING ILLNESS: Ambulating with a cane.  Alone.   Chris [name] 62 y.o.  male metastatic stage IV non-small cell lung cancer/favor adenocarcinoma-currently on maintained libatyo here for follow-up.   Patient feels weak,  off balanced. Having some dizziness. Very fatigued. Slept all day. Had orthostatic hypotension yest evening; BP sitting 133/73 standing 103/66.  BP in clinic 127 /77 sitting, 139/78 standing. Appetite is  down. Not drinking enough. Started taking his long acting pain medicine at night. The narcotics cause constipatio  Breathing well.  No nausea no vomiting.  Review of Systems  Constitutional:  Positive for malaise/fatigue. Negative for chills, diaphoresis, fever and weight loss.  HENT:  Positive for hearing loss. Negative for nosebleeds and sore throat.   Eyes:  Negative for double vision.  Respiratory:  Negative for cough, hemoptysis, sputum production, shortness of breath and wheezing.   Cardiovascular:  Negative for chest pain, palpitations, orthopnea and leg swelling.  Gastrointestinal:  Negative for abdominal pain, blood in stool, diarrhea, heartburn, melena, nausea and vomiting.  Genitourinary:  Negative for dysuria, frequency and urgency.  Musculoskeletal:  Negative for back pain.  Skin: Negative.  Negative for itching and rash.  Neurological:  Negative for dizziness, tingling, focal weakness, weakness and headaches.  Endo/Heme/Allergies:  Does not bruise/bleed easily.  Psychiatric/Behavioral:  Negative for depression. The patient does not have insomnia.      MEDICAL HISTORY:  Past Medical History:  Diagnosis Date   Anxiety    associated with medical care,  worsened by difficulty hearing, does better with wife present   Arthritis    Cancer associated pain    Cancer, metastatic to liver [facility])    Complication of anesthesia    wife states very anxious may need pre sedation   Coronary artery disease    Dyspnea    GERD (gastroesophageal reflux disease)    Hearing loss    mild per wife   Hearing loss    no hearing aids per wife   Hyperlipidemia    Hypertension    MI (myocardial infarction) (HCC) 04/17/2006   Acute inferolateral wall MI with cardiogenic shock and complete heart block   Primary adenocarcinoma of upper lobe of right lung (HCC)    Prostate cancer (HCC)    Sleep apnea    Tobacco abuse    Wears glasses    Wears partial dentures    Upper    SURGICAL  HISTORY: Past Surgical History:  Procedure Laterality Date   BRONCHIAL BIOPSY  10/09/2021   Procedure: BRONCHIAL BIOPSIES;  Surgeon: Leslye Peer, MD;  Location: [facility] ENDOSCOPY;  Service: Pulmonary;;   BRONCHIAL BRUSHINGS  10/09/2021   Procedure: BRONCHIAL BRUSHINGS;  Surgeon: Leslye Peer, MD;  Location: [facility] ENDOSCOPY;  Service: Pulmonary;;   BRONCHIAL NEEDLE ASPIRATION BIOPSY  10/09/2021   Procedure: BRONCHIAL NEEDLE ASPIRATION BIOPSIES;  Surgeon: Leslye Peer, MD;  Location: [facility] ENDOSCOPY;  Service: Pulmonary;;   CARDIAC CATHETERIZATION  2007   left, RCA 100% occluded ruptured plaque with thrombus in the proximal segment   CYST EXCISION N/A 08/30/2021   Procedure: EXCISION OF POSTERIOR SCALP CYST;  Surgeon: Berna Bue, MD;  Location: [facility] ORS;  Service: General;  Laterality: N/A;   CYSTOSCOPY N/A 07/17/2018   Procedure: Bluford Kaufmann;  Surgeon: Marcine Matar, MD;  Location: [facility];  Service: Urology;  Laterality: N/A;  no seeds in bladder per Dr Retta Diones   FEMUR IM NAIL Right 04/03/2023   Procedure: REVISION FIXATION OF RIGHT FEMUR NONUNION;  Surgeon: Roby Lofts, MD;  Location: [facility] OR;  Service: Orthopedics;  Laterality: Right;   FIDUCIAL MARKER PLACEMENT  10/09/2021   Procedure: FIDUCIAL MARKER PLACEMENT;  Surgeon: Leslye Peer, MD;  Location: [facility] ENDOSCOPY;  Service: Pulmonary;;   HAND SURGERY Right    has metal plate in arm   HARDWARE REMOVAL Right 04/03/2023   Procedure: REMOVAL OF PREVIOUS HARDWARE FEMUR;  Surgeon: Roby Lofts, MD;  Location: [facility] OR;  Service: Orthopedics;  Laterality: Right;   PROSTATE BIOPSY     RADIOACTIVE SEED IMPLANT N/A 07/17/2018   Procedure: RADIOACTIVE SEED IMPLANT/BRACHYTHERAPY IMPLANT;  Surgeon: Marcine Matar, MD;  Location[facility];  Service: Urology;  Laterality: N/A;  77 seeds   SPACE OAR INSTILLATION N/A 07/17/2018   Procedure: SPACE OAR INSTILLATION;  Surgeon: Marcine Matar, MD;   Location[facility];  Service: Urology;  Laterality: N/A;   VIDEO BRONCHOSCOPY WITH RADIAL ENDOBRONCHIAL ULTRASOUND  10/09/2021   Procedure: VIDEO BRONCHOSCOPY WITH RADIAL ENDOBRONCHIAL ULTRASOUND;  Surgeon: Leslye Peer, MD;  Location: [facility] ENDOSCOPY;  Service: Pulmonary;;   WRIST SURGERY  10/04/2012    SOCIAL HISTORY: Social History   Socioeconomic History   Marital status: Married    Spouse name: Not on file   Number of children: Not on file   Years of education: Not on file   Highest education level: Not on file  Occupational History   Not on file  Tobacco Use   Smoking status: Every Day    Current packs/day: 0.75    Average packs/day: 0.8 packs/day for 42.0 years (31.5 ttl pk-yrs)    Types: Cigarettes   Smokeless tobacco: Never  Vaping Use   Vaping status: Never Used  Substance and Sexual Activity   Alcohol use: No   Drug use: No   Sexual activity: Yes    Birth control/protection: None  Other Topics Concern   Not on file  Social History Narrative   10-04 [birth year] Unable to ask abuse questions wife with him today.   Are you right handed or left handed? Ambidextrous prominent right   Are you currently employed ? 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 no   Who lives with you? Wife and patient   What type of home do you live in: 1 story or 2 story? 1 story       Social Determinants of Health   Financial Resource Strain: Not on file  Food Insecurity: No Food Insecurity (04/03/2023)   Hunger Vital Sign    Worried About Running Out of Food in the Last Year: Never true    Ran Out of Food in the Last Year: Never true  Transportation Needs: No Transportation Needs (04/03/2023)   PRAPARE - Administrator, Civil Service (Medical): No    Lack of Transportation (Non-Medical): No  Physical Activity: Not on file  Stress: Not on file  Social Connections: Not on file  Intimate Partner Violence: Not At Risk  (04/03/2023)   Humiliation, Afraid, Rape, and Kick questionnaire    Fear of Current or Ex-Partner: No    Emotionally Abused: No    Physically Abused: No    Sexually Abused: No    FAMILY HISTORY: Family History  Problem Relation Age of Onset   Breast cancer Mother    Prostate cancer Neg Hx    Kidney cancer Neg Hx    Cancer Neg Hx     ALLERGIES:  has no allergies on file.  MEDICATIONS:  Current Outpatient Medications  Medication Sig Dispense Refill   ALPRAZolam (XANAX) 0.25 MG tablet Take 1 tablet (0.25 mg total) by mouth at bedtime. (Patient taking differently:  Take 0.25 mg by mouth at bedtime as needed for anxiety.) 15 tablet 0   atorvastatin (LIPITOR) 20 MG tablet TAKE 1 TABLET BY MOUTH EVERY DAY 90 tablet 3   clopidogrel (PLAVIX) 75 MG tablet TAKE 1 TABLET BY MOUTH EVERY DAY 90 tablet 2   fluticasone (FLONASE) 50 MCG/ACT nasal spray Place 1 spray into both nostrils daily as needed for allergies.     ketoconazole (NIZORAL) 2 % cream Apply 1 application  topically 2 (two) times daily as needed for irritation.     methylPREDNISolone (MEDROL DOSEPAK) 4 MG TBPK tablet Use as directed. 21 tablet 1   metoprolol tartrate (LOPRESSOR) 25 MG tablet TAKE 1 TABLET (25 MG TOTAL) BY MOUTH DAILY. 90 tablet 2   pantoprazole (PROTONIX) 40 MG tablet Take 1 tablet (40 mg total) by mouth daily. 60 tablet 1   sennosides-docusate sodium (SENOKOT-S) 8.6-50 MG tablet Take 1 tablet by mouth daily. 60 tablet 1   sertraline (ZOLOFT) 100 MG tablet TAKE 1 TABLET BY MOUTH EVERY DAY 90 tablet 0   tamsulosin (FLOMAX) 0.4 MG CAPS capsule Take 1 capsule (0.4 mg total) by mouth daily. 30 capsule 3   Vitamin D, Ergocalciferol, (DRISDOL) 1.25 MG (50000 UNIT) CAPS capsule TAKE 1 CAPSULE BY MOUTH ONE TIME PER WEEK 12 capsule 1   oxyCODONE ER (XTAMPZA ER) 9 MG C12A Take 1 capsule by mouth every 12 (twelve) hours. 60 capsule 0   Oxycodone HCl 10 MG TABS Take 0.5-1 tablets (5-10 mg total) by mouth every 4 (four) hours as  needed. 60 tablet 0   No current facility-administered medications for this visit.   Facility-Administered Medications Ordered in Other Visits  Medication Dose Route Frequency Provider Last Rate Last Admin   dexamethasone (DECADRON) injection 4 mg  4 mg Intravenous Once Louretta Shorten R, MD          .  PHYSICAL EXAMINATION: ECOG PERFORMANCE STATUS: 1 - Symptomatic but completely ambulatory  Vitals:   08/07/23 0907  BP: 127/77  Pulse: 65  Resp: 18  Temp: (!) 97.5 F (36.4 C)  SpO2: 99%       Filed Weights   08/07/23 0907  Weight: 244 lb 12.8 oz (111 kg)        Physical Exam Vitals and nursing note reviewed.  HENT:     Head: Normocephalic and atraumatic.     Mouth/Throat:     Pharynx: Oropharynx is clear.  Eyes:     Extraocular Movements: Extraocular movements intact.     Pupils: Pupils are equal, round, and reactive to light.  Cardiovascular:     Rate and Rhythm: Normal rate and regular rhythm.  Pulmonary:     Comments: Decreased breath sounds bilaterally.  Abdominal:     Palpations: Abdomen is soft.  Musculoskeletal:        General: Normal range of motion.     Cervical back: Normal range of motion.  Skin:    General: Skin is warm.  Neurological:     General: No focal deficit present.     Mental Status: He is alert and oriented to person, place, and time.  Psychiatric:        Behavior: Behavior normal.        Judgment: Judgment normal.      LABORATORY DATA:  I have reviewed the data as listed Lab Results  Component Value Date   WBC 6.2 08/07/2023   HGB 15.2 08/07/2023   HCT 46.6 08/07/2023   MCV 87.6 08/07/2023  PLT 220 08/07/2023   Recent Labs    06/19/23 1044 07/17/23 0816 08/07/23 0842  NA 137 137 139  K 3.9 3.9 3.9  CL 105 108 106  CO2 23 23 25   GLUCOSE 114* 117* 124*  BUN 14 16 18   CREATININE 0.89 0.93 1.06  CALCIUM 9.0 9.0 9.0  GFRNONAA >60 >60 >60  PROT 7.8 7.3 7.3  ALBUMIN 4.4 4.2 4.0  AST 19 18 17   ALT 20 17  19   ALKPHOS 83 81 83  BILITOT 0.3 0.5 0.5    RADIOGRAPHIC STUDIES: I have personally reviewed the radiological images as listed and agreed with the findings in the report. No results found.  ASSESSMENT & PLAN:   Primary adenocarcinoma of upper lobe of right lung (HCC) # Non-small cell lung cancer/stage IV-favor adeno carcinoma-metastases to right femur/liver; synchronous squamous cell carcinoma scalp  S/P- Patient currently on maintanence Libatayo.  CT CAP- 5th AUG, 2024-   Unchanged, treated nodule of the right pulmonary apex. Adjacent post radiation change similar to prior;  Unchanged, tiny treated metastasis of the right lobe of the liver. No new lesions. No evidence of lymphadenopathy or new metastatic disease in the chest, abdomen, or pelvis. NECK CT- NED.   # HOLD Single agent Libtayo maintenance- Labs today reviewed;  acceptable for treatment today.  AUG 2024- TSH-WNL- stable.   # Extreme fatigue- Question etiology- ? Viral infection; low clinical suspicon at adrenal insuffiency- HOLD metoprolol.    # BONE METASTASES:  Impending right hip fracture right hip pain-status post radiation [[facility]; DEC 2022]- S/p  Re-RT on 10/03 x5 Fx. NOV 2023-status post intramedullary nail fixation;JUNE 2024 [[place]] Re-surgery-    Awaiting dental clearance for zometa.Not any pain medications.   Stable.     # Low vit D- [July 2023- vit D- 25]; on  ergocalciferol- Calcium 9.5 improved/stable. FEB vit D-25-OH- 41. Stable.     # Squamous cell carcinoma of the scalp-s/p excision; positive margins- on libtayo-no clinical evidence of progression.  Might need to consider radiation-if any local progression noted/also based on course lung cancer. Stable.     # COPD-recommend follow-up with pulmonary, [facility] [place]- Stable.    # GERD- on prilosec continue PPI BID prior to meals-  Stable.    # IV access: PIV  # DISPOSITION: #HOLD Libtayo; IVFs 1 hour over 1 hour; Dex 4 mg x1-  # Follow up in 3 weeks- APP- labs-  cbc/cmp; cortisol ;ATCH; possible- IVFs 1 hour over 1 hour; Dex 4 mg x1-  # as per IS- follow up 6 weeks- MD;labs- cbc/cmp; Libtayo;TSH;  possible- IVFs 1 hour over 1 hour; Dex 4 mg x1-  CT CAP- Dr.B     All questions were answered. The patient knows to call the clinic with any problems, questions or concerns.    Earna Coder, MD 08/07/2023 11:07 AM

## 2023-08-07 NOTE — Progress Notes (Signed)
Pt feels weak, off balanced. Having some dizziness. Very fatigued. Slept all day. Had orthostatic hypotension yest evening; BP sitting 133/73 standing 103/66. BP in clinic 127 /77 sitting, 139/78 standing. Appetite is down. Not drinking enough.Started taking his long acting pain medicine at night. The narcotics cause constipation.

## 2023-08-07 NOTE — Assessment & Plan Note (Signed)
#   Non-small cell lung cancer/stage IV-favor adeno carcinoma-metastases to right femur/liver; synchronous squamous cell carcinoma scalp  S/P- Patient currently on maintanence Libatayo.  CT CAP- 5th AUG, 2024-   Unchanged, treated nodule of the right pulmonary apex. Adjacent post radiation change similar to prior;  Unchanged, tiny treated metastasis of the right lobe of the liver. No new lesions. No evidence of lymphadenopathy or new metastatic disease in the chest, abdomen, or pelvis. NECK CT- NED.   # HOLD Single agent Libtayo maintenance- Labs today reviewed;  acceptable for treatment today.  AUG 2024- TSH-WNL- stable.   # Extreme fatigue- Question etiology- ? Viral infection; low clinical suspicon at adrenal insuffiency- HOLD metoprolol.    # BONE METASTASES:  Impending right hip fracture right hip pain-status post radiation [[facility]; DEC 2022]- S/p  Re-RT on 10/03 x5 Fx. NOV 2023-status post intramedullary nail fixation;JUNE 2024 [[place]] Re-surgery-    Awaiting dental clearance for zometa.Not any pain medications.   Stable.     # Low vit D- [July 2023- vit D- 25]; on  ergocalciferol- Calcium 9.5 improved/stable. FEB vit D-25-OH- 41. Stable.     # Squamous cell carcinoma of the scalp-s/p excision; positive margins- on libtayo-no clinical evidence of progression.  Might need to consider radiation-if any local progression noted/also based on course lung cancer. Stable.     # COPD-recommend follow-up with pulmonary, [facility] [place]- Stable.    # GERD- on prilosec continue PPI BID prior to meals-  Stable.    # IV access: PIV  # DISPOSITION: #HOLD Libtayo; IVFs 1 hour over 1 hour; Dex 4 mg x1-  # Follow up in 3 weeks- APP- labs- cbc/cmp; cortisol ;ATCH; possible- IVFs 1 hour over 1 hour; Dex 4 mg x1-  # as per IS- follow up 6 weeks- MD;labs- cbc/cmp; Libtayo;TSH;  possible- IVFs 1 hour over 1 hour; Dex 4 mg x1-  CT CAP- Dr.B

## 2023-08-15 ENCOUNTER — Ambulatory Visit: Payer: BC Managed Care – PPO | Admitting: Pulmonary Disease

## 2023-08-27 ENCOUNTER — Inpatient Hospital Stay (HOSPITAL_BASED_OUTPATIENT_CLINIC_OR_DEPARTMENT_OTHER): Payer: BC Managed Care – PPO | Admitting: Nurse Practitioner

## 2023-08-27 ENCOUNTER — Inpatient Hospital Stay: Payer: BC Managed Care – PPO

## 2023-08-27 ENCOUNTER — Encounter: Payer: Self-pay | Admitting: Nurse Practitioner

## 2023-08-27 VITALS — BP 108/88 | HR 69 | Temp 98.6°F | Resp 20 | Wt 245.2 lb

## 2023-08-27 DIAGNOSIS — C3411 Malignant neoplasm of upper lobe, right bronchus or lung: Secondary | ICD-10-CM

## 2023-08-27 DIAGNOSIS — F17210 Nicotine dependence, cigarettes, uncomplicated: Secondary | ICD-10-CM | POA: Diagnosis not present

## 2023-08-27 DIAGNOSIS — J449 Chronic obstructive pulmonary disease, unspecified: Secondary | ICD-10-CM | POA: Diagnosis not present

## 2023-08-27 DIAGNOSIS — I951 Orthostatic hypotension: Secondary | ICD-10-CM | POA: Diagnosis not present

## 2023-08-27 DIAGNOSIS — C787 Secondary malignant neoplasm of liver and intrahepatic bile duct: Secondary | ICD-10-CM | POA: Diagnosis not present

## 2023-08-27 DIAGNOSIS — C4442 Squamous cell carcinoma of skin of scalp and neck: Secondary | ICD-10-CM | POA: Diagnosis not present

## 2023-08-27 DIAGNOSIS — R5383 Other fatigue: Secondary | ICD-10-CM | POA: Diagnosis not present

## 2023-08-27 DIAGNOSIS — R42 Dizziness and giddiness: Secondary | ICD-10-CM | POA: Diagnosis not present

## 2023-08-27 DIAGNOSIS — C7951 Secondary malignant neoplasm of bone: Secondary | ICD-10-CM | POA: Diagnosis not present

## 2023-08-27 DIAGNOSIS — K219 Gastro-esophageal reflux disease without esophagitis: Secondary | ICD-10-CM | POA: Diagnosis not present

## 2023-08-27 LAB — CMP (CANCER CENTER ONLY)
ALT: 20 U/L (ref 0–44)
AST: 22 U/L (ref 15–41)
Albumin: 4.1 g/dL (ref 3.5–5.0)
Alkaline Phosphatase: 80 U/L (ref 38–126)
Anion gap: 9 (ref 5–15)
BUN: 16 mg/dL (ref 8–23)
CO2: 24 mmol/L (ref 22–32)
Calcium: 8.8 mg/dL — ABNORMAL LOW (ref 8.9–10.3)
Chloride: 106 mmol/L (ref 98–111)
Creatinine: 1.07 mg/dL (ref 0.61–1.24)
GFR, Estimated: >60 mL/min (ref >60)
Glucose, Bld: 150 mg/dL — ABNORMAL HIGH (ref 70–99)
Potassium: 4.1 mmol/L (ref 3.5–5.1)
Sodium: 139 mmol/L (ref 135–145)
Total Bilirubin: 0.6 mg/dL (ref 0.3–1.2)
Total Protein: 7 g/dL (ref 6.5–8.1)

## 2023-08-27 LAB — CBC WITH DIFFERENTIAL (CANCER CENTER ONLY)
Abs Immature Granulocytes: 0.02 10*3/uL (ref 0.00–0.07)
Basophils Absolute: 0 10*3/uL (ref 0.0–0.1)
Basophils Relative: 1 %
Eosinophils Absolute: 0.1 10*3/uL (ref 0.0–0.5)
Eosinophils Relative: 1 %
HCT: 44.4 % (ref 39.0–52.0)
Hemoglobin: 14.5 g/dL (ref 13.0–17.0)
Immature Granulocytes: 0 %
Lymphocytes Relative: 17 %
Lymphs Abs: 1 10*3/uL (ref 0.7–4.0)
MCH: 28.7 pg (ref 26.0–34.0)
MCHC: 32.7 g/dL (ref 30.0–36.0)
MCV: 87.9 fL (ref 80.0–100.0)
Monocytes Absolute: 0.6 10*3/uL (ref 0.1–1.0)
Monocytes Relative: 10 %
Neutro Abs: 4 10*3/uL (ref 1.7–7.7)
Neutrophils Relative %: 71 %
Platelet Count: 202 10*3/uL (ref 150–400)
RBC: 5.05 MIL/uL (ref 4.22–5.81)
RDW: 15.2 % (ref 11.5–15.5)
WBC Count: 5.6 10*3/uL (ref 4.0–10.5)
nRBC: 0 % (ref 0.0–0.2)

## 2023-08-27 LAB — CORTISOL: Cortisol, Plasma: 7.3 ug/dL

## 2023-08-27 NOTE — Progress Notes (Signed)
[facility] CONSULT NOTE  Patient Care Team: Sherrie Mustache, MD as PCP - General (Internal Medicine) Runell Gess, MD as PCP - Cardiology (Cardiology) [name], MD as Consulting Physician (Oncology) Sherrie Mustache, MD as Referring Physician (Internal Medicine) Earna Coder, MD as Consulting Physician (Internal Medicine) Earna Coder, MD as Consulting Physician (Internal Medicine)  CHIEF COMPLAINTS/PURPOSE OF CONSULTATION: lung cancer  #  Oncology History Overview Note  DIAGNOSIS: 1) stage IV (T1c, N0, M1 C) non-small cell lung cancer favoring adenocarcinoma presented with right lung apical nodule in addition to metastatic disease in the right hepatic lobe and the proximal right femoral diaphysis diagnosed and December 2022. 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    QNS; Guardant 360 showed positive KRAS G12C mutation  FINAL MICROSCOPIC DIAGNOSIS:   A. LUNG, RUL, FINE NEEDLE ASPIRATION:  - Malignant cells consistent with non-small cell carcinoma, see comment   B. LUNG, RUL, BRUSHING:  - Malignant cells consistent with non-small cell carcinoma, see comment       COMMENT:   A and B.  Dr. Luisa Hart reviewed the case and concurs with the diagnosis.  Only rare malignant cells are present on the cellblock.  Immunohistochemical stains were attempted and show that the tumor cells  have patchy staining for TTF-1 whereas p63, p40 and CK5/6 are negative.  The findings are nondiagnostic but suggestive of a lung adenocarcinoma.  Dr. Delton Coombes was notified on 10/13/2021.   SEP-OCT 2022-  [Dermatology]-   Poorly differentiated squamous cell carcinoma; -  Carcinoma extends to the edges of the excision specimen   IMPRESSION: 1. The spiculated nodule at the right lung apex on recent neck CT is hypermetabolic and is concerning for primary bronchogenic  carcinoma. Tissue sampling recommended. 2. Hypermetabolic activity within the occipital scalp and small previously demonstrated right occipital lymph node compatible with known squamous cell carcinoma. Evaluation of the head and neck limited by motion artifact. 3. Hypermetabolic lesions inferiorly in the right hepatic lobe and in the proximal right femoral diaphysis suspicious for metastatic disease, primary uncertain in this patient with a history of prostate cancer. Correlate with PSA levels. Abdominal MRI without and with contrast may be helpful for further characterization of the liver lesion.  # s/p RT to RUL [[place]]; right hip RT; # BONE METASTASES:  Impending right hip fracture right hip pain-status post radiation [[facility]; DEC 2022]- S/p  Re-RT on 10/03 x5 Fx. NOV 2023-status post intramedullary nail fixation;JUNE 2024 [[place]] Re-surgery-    Awaiting dental clearance for zometa.    # 2007MI-CAD [s/p stent-Dr.Berry; EF 2021- 58%]     Primary adenocarcinoma of upper lobe of right lung (HCC)  10/18/2021 Initial Diagnosis   Primary adenocarcinoma of upper lobe of right lung (HCC)   10/18/2021 Cancer Staging   Staging form: Lung, AJCC 8th Edition - Clinical: Stage IVB (cT1c, cN0, cM1c) - Signed by [name], MD on 10/18/2021   11/01/2021 - 05/30/2022 Chemotherapy   Patient is on Treatment Plan : LUNG NSCLC Pemetrexed + Carboplatin q21d x 4 Cycles     11/01/2021 -  Chemotherapy   Patient is on Treatment Plan : LUNG NSCLC Libtayo q  21d       HISTORY OF PRESENTING ILLNESS: Ambulating with a cane.  Alone.   Chris [name] 62 y.o.  male metastatic stage IV non-small cell lung cancer/favor adenocarcinoma, currently on maintenance libtayo, returns to clinic for follow up. He  had previously experienced some dizziness but symptoms have now resolved. He has been holding libtayo and reports compliance with steroids. Complains of impaired sleep. He's increased fluid intake which has helped.  No worsening cough, shortness of breath. Pain is stable and unchanged.    Review of Systems  Constitutional:  Positive for malaise/fatigue. Negative for chills, diaphoresis, fever and weight loss.  HENT:  Positive for hearing loss. Negative for nosebleeds and sore throat.   Eyes:  Negative for double vision.  Respiratory:  Negative for cough, hemoptysis, sputum production, shortness of breath and wheezing.   Cardiovascular:  Negative for chest pain, palpitations, orthopnea and leg swelling.  Gastrointestinal:  Negative for abdominal pain, blood in stool, diarrhea, heartburn, melena, nausea and vomiting.  Genitourinary:  Negative for dysuria, frequency and urgency.  Musculoskeletal:  Negative for back pain.  Skin: Negative.  Negative for itching and rash.  Neurological:  Negative for dizziness, tingling, focal weakness, weakness and headaches.  Endo/Heme/Allergies:  Does not bruise/bleed easily.  Psychiatric/Behavioral:  Negative for depression. The patient does not have insomnia.      MEDICAL HISTORY:  Past Medical History:  Diagnosis Date   Anxiety    associated with medical care,  worsened by difficulty hearing, does better with wife present   Arthritis    Cancer associated pain    Cancer, metastatic to liver [facility])    Complication of anesthesia    wife states very anxious may need pre sedation   Coronary artery disease    Dyspnea    GERD (gastroesophageal reflux disease)    Hearing loss    mild per wife   Hearing loss    no hearing aids per wife   Hyperlipidemia    Hypertension    MI (myocardial infarction) (HCC) 04/17/2006   Acute inferolateral wall MI with cardiogenic shock and complete heart block   Primary adenocarcinoma of upper lobe of right lung (HCC)    Prostate cancer (HCC)    Sleep apnea    Tobacco abuse    Wears glasses    Wears partial dentures    Upper    SURGICAL HISTORY: Past Surgical History:  Procedure Laterality Date   BRONCHIAL BIOPSY   10/09/2021   Procedure: BRONCHIAL BIOPSIES;  Surgeon: Leslye Peer, MD;  Location: [facility] ENDOSCOPY;  Service: Pulmonary;;   BRONCHIAL BRUSHINGS  10/09/2021   Procedure: BRONCHIAL BRUSHINGS;  Surgeon: Leslye Peer, MD;  Location: [facility] ENDOSCOPY;  Service: Pulmonary;;   BRONCHIAL NEEDLE ASPIRATION BIOPSY  10/09/2021   Procedure: BRONCHIAL NEEDLE ASPIRATION BIOPSIES;  Surgeon: Leslye Peer, MD;  Location: [facility] ENDOSCOPY;  Service: Pulmonary;;   CARDIAC CATHETERIZATION  2007   left, RCA 100% occluded ruptured plaque with thrombus in the proximal segment   CYST EXCISION N/A 08/30/2021   Procedure: EXCISION OF POSTERIOR SCALP CYST;  Surgeon: Berna Bue, MD;  Location: [facility] ORS;  Service: General;  Laterality: N/A;   CYSTOSCOPY N/A 07/17/2018   Procedure: Bluford Kaufmann;  Surgeon: Marcine Matar, MD;  Location: [facility];  Service: Urology;  Laterality: N/A;  no seeds in bladder per Dr Retta Diones   FEMUR IM NAIL Right 04/03/2023   Procedure: REVISION FIXATION OF RIGHT FEMUR NONUNION;  Surgeon: Roby Lofts, MD;  Location: [facility] OR;  Service: Orthopedics;  Laterality: Right;   FIDUCIAL MARKER PLACEMENT  10/09/2021   Procedure: FIDUCIAL MARKER PLACEMENT;  Surgeon: Leslye Peer, MD;  Location: [facility] ENDOSCOPY;  Service: Pulmonary;;   HAND SURGERY Right    has  metal plate in arm   HARDWARE REMOVAL Right 04/03/2023   Procedure: REMOVAL OF PREVIOUS HARDWARE FEMUR;  Surgeon: Roby Lofts, MD;  Location: [facility] OR;  Service: Orthopedics;  Laterality: Right;   PROSTATE BIOPSY     RADIOACTIVE SEED IMPLANT N/A 07/17/2018   Procedure: RADIOACTIVE SEED IMPLANT/BRACHYTHERAPY IMPLANT;  Surgeon: Marcine Matar, MD;  Location: [facility];  Service: Urology;  Laterality: N/A;  77 seeds   SPACE OAR INSTILLATION N/A 07/17/2018   Procedure: SPACE OAR INSTILLATION;  Surgeon: Marcine Matar, MD;  Location: [facility];  Service: Urology;  Laterality: N/A;   VIDEO  BRONCHOSCOPY WITH RADIAL ENDOBRONCHIAL ULTRASOUND  10/09/2021   Procedure: VIDEO BRONCHOSCOPY WITH RADIAL ENDOBRONCHIAL ULTRASOUND;  Surgeon: Leslye Peer, MD;  Location: [facility] ENDOSCOPY;  Service: Pulmonary;;   WRIST SURGERY  10/04/2012    SOCIAL HISTORY: Social History   Socioeconomic History   Marital status: Married    Spouse name: Not on file   Number of children: Not on file   Years of education: Not on file   Highest education level: Not on file  Occupational History   Not on file  Tobacco Use   Smoking status: Every Day    Current packs/day: 0.75    Average packs/day: 0.8 packs/day for 42.0 years (31.5 ttl pk-yrs)    Types: Cigarettes   Smokeless tobacco: Never  Vaping Use   Vaping status: Never Used  Substance and Sexual Activity   Alcohol use: No   Drug use: No   Sexual activity: Yes    Birth control/protection: None  Other Topics Concern   Not on file  Social History Narrative   10-04 [birth year] Unable to ask abuse questions wife with him today.   Are you right handed or left handed? Ambidextrous prominent right   Are you currently employed ? 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 no   Who lives with you? Wife and patient   What type of home do you live in: 1 story or 2 story? 1 story       Social Determinants of Health   Financial Resource Strain: Not on file  Food Insecurity: No Food Insecurity (04/03/2023)   Hunger Vital Sign    Worried About Running Out of Food in the Last Year: Never true    Ran Out of Food in the Last Year: Never true  Transportation Needs: No Transportation Needs (04/03/2023)   PRAPARE - Administrator, Civil Service (Medical): No    Lack of Transportation (Non-Medical): No  Physical Activity: Not on file  Stress: Not on file  Social Connections: Not on file  Intimate Partner Violence: Not At Risk (04/03/2023)   Humiliation, Afraid, Rape, and Kick questionnaire    Fear of Current  or Ex-Partner: No    Emotionally Abused: No    Physically Abused: No    Sexually Abused: No    FAMILY HISTORY: Family History  Problem Relation Age of Onset   Breast cancer Mother    Prostate cancer Neg Hx    Kidney cancer Neg Hx    Cancer Neg Hx     ALLERGIES:  has No Known Allergies.  MEDICATIONS:  Current Outpatient Medications  Medication Sig Dispense Refill   ALPRAZolam (XANAX) 0.25 MG tablet Take 1 tablet (0.25 mg total) by mouth at bedtime. (Patient taking differently: Take 0.25 mg by mouth at bedtime as needed for anxiety.) 15  tablet 0   atorvastatin (LIPITOR) 20 MG tablet TAKE 1 TABLET BY MOUTH EVERY DAY 90 tablet 3   clopidogrel (PLAVIX) 75 MG tablet TAKE 1 TABLET BY MOUTH EVERY DAY 90 tablet 2   fluticasone (FLONASE) 50 MCG/ACT nasal spray Place 1 spray into both nostrils daily as needed for allergies.     ketoconazole (NIZORAL) 2 % cream Apply 1 application  topically 2 (two) times daily as needed for irritation.     methylPREDNISolone (MEDROL DOSEPAK) 4 MG TBPK tablet Use as directed. 21 tablet 1   metoprolol tartrate (LOPRESSOR) 25 MG tablet TAKE 1 TABLET (25 MG TOTAL) BY MOUTH DAILY. 90 tablet 2   oxyCODONE ER (XTAMPZA ER) 9 MG C12A Take 1 capsule by mouth every 12 (twelve) hours. 60 capsule 0   Oxycodone HCl 10 MG TABS Take 0.5-1 tablets (5-10 mg total) by mouth every 4 (four) hours as needed. 60 tablet 0   pantoprazole (PROTONIX) 40 MG tablet Take 1 tablet (40 mg total) by mouth daily. 60 tablet 1   sennosides-docusate sodium (SENOKOT-S) 8.6-50 MG tablet Take 1 tablet by mouth daily. 60 tablet 1   sertraline (ZOLOFT) 100 MG tablet TAKE 1 TABLET BY MOUTH EVERY DAY 90 tablet 0   tamsulosin (FLOMAX) 0.4 MG CAPS capsule Take 1 capsule (0.4 mg total) by mouth daily. 30 capsule 3   Vitamin D, Ergocalciferol, (DRISDOL) 1.25 MG (50000 UNIT) CAPS capsule TAKE 1 CAPSULE BY MOUTH ONE TIME PER WEEK 12 capsule 1   No current facility-administered medications for this visit.     PHYSICAL EXAMINATION: ECOG PERFORMANCE STATUS: 1 - Symptomatic but completely ambulatory  Vitals:   08/27/23 1403  BP: 108/88  Pulse: 69  Resp: 20  Temp: 98.6 F (37 C)  SpO2: 100%     Filed Weights   08/27/23 1403  Weight: 245 lb 3.2 oz (111.2 kg)   Physical Exam Vitals reviewed.  HENT:     Head: Normocephalic and atraumatic.     Mouth/Throat:     Pharynx: Oropharynx is clear.  Eyes:     Extraocular Movements: Extraocular movements intact.     Pupils: Pupils are equal, round, and reactive to light.  Cardiovascular:     Rate and Rhythm: Normal rate and regular rhythm.  Pulmonary:     Effort: No respiratory distress.     Comments: Decreased breath sounds bilaterally.  Abdominal:     General: There is no distension.     Palpations: Abdomen is soft.  Skin:    General: Skin is warm.     Coloration: Skin is not pale.  Neurological:     Mental Status: He is alert and oriented to person, place, and time.  Psychiatric:        Mood and Affect: Mood normal.        Behavior: Behavior normal.     LABORATORY DATA:  I have reviewed the data as listed Lab Results  Component Value Date   WBC 5.6 08/27/2023   HGB 14.5 08/27/2023   HCT 44.4 08/27/2023   MCV 87.9 08/27/2023   PLT 202 08/27/2023   Recent Labs    07/17/23 0816 08/07/23 0842 08/27/23 1331  NA 137 139 139  K 3.9 3.9 4.1  CL 108 106 106  CO2 23 25 24   GLUCOSE 117* 124* 150*  BUN 16 18 16   CREATININE 0.93 1.06 1.07  CALCIUM 9.0 9.0 8.8*  GFRNONAA >60 >60 >60  PROT 7.3 7.3 7.0  ALBUMIN 4.2 4.0 4.1  AST 18 17 22   ALT 17 19 20   ALKPHOS 81 83 80  BILITOT 0.5 0.5 0.6    RADIOGRAPHIC STUDIES: I have personally reviewed the radiological images as listed and agreed with the findings in the report. No results found.  ASSESSMENT & PLAN:   # Non-small cell lung cancer/stage IV-favor adeno carcinoma-metastases to right femur/liver; synchronous squamous cell carcinoma scalp  S/P- Patient currently  on maintanence Libatayo.  CT CAP- 5th AUG, 2024-   Unchanged, treated nodule of the right pulmonary apex. Adjacent post radiation change similar to prior;  Unchanged, tiny treated metastasis of the right lobe of the liver. No new lesions. No evidence of lymphadenopathy or new metastatic disease in the chest, abdomen, or pelvis. NECK CT- NED.    # On Libtayo maintenance which has been held d/t orthostasis, fatigue. Metoprolol held. Symptoms improved with steroids. Continue to hold Libtayo. Cancel fluids and steroids today. Low suspicion of adrenal insufficiency. AM Cortisol normal. ACTH normal.   # BONE METASTASES:  Impending right hip fracture right hip pain-status post radiation [[facility]; DEC 2022]- S/p  Re-RT on 10/03 x5 Fx. NOV 2023-status post intramedullary nail fixation;JUNE 2024 [[place]] Re-surgery. Not on zometa- awaiting dental clearance. Pain currently well controlled. Not on narcotics d/t constipation.    # Low vit D- [July 2023- vit D- 25]; on  ergocalciferol- Calcium 9.5 improved/stable. FEB vit D-25-OH- 41. Stable.      # Squamous cell carcinoma of the scalp-s/p excision; positive margins- on libtayo. Clinically, no evidence of progressive disease. Potential for radiation if any local progression. Plan to reimage prior to next appt with Dr. Donneta Romberg.    # COPD-recommend follow-up with pulmonary, [facility] [place]- Stable.     # GERD- on prilosec continue PPI BID prior to meals-  Stable.     # IV access: PIV   # DISPOSITION: # Follow up in 3 weeks with Dr Donneta Romberg with CT C/A/P prior  No problem-specific Assessment & Plan notes found for this encounter.  All questions were answered. The patient knows to call the clinic with any problems, questions or concerns.   Alinda Dooms, NP 08/27/2023

## 2023-08-28 LAB — ACTH: C206 ACTH: 8.5 pg/mL (ref 7.2–63.3)

## 2023-09-02 ENCOUNTER — Other Ambulatory Visit: Payer: Self-pay | Admitting: *Deleted

## 2023-09-02 DIAGNOSIS — C3411 Malignant neoplasm of upper lobe, right bronchus or lung: Secondary | ICD-10-CM

## 2023-09-04 ENCOUNTER — Inpatient Hospital Stay
Admission: RE | Admit: 2023-09-04 | Discharge: 2023-09-04 | Discharge status: Home / Self Care | Payer: BC Managed Care – PPO | Source: Ambulatory Visit | Attending: Internal Medicine

## 2023-09-04 ENCOUNTER — Encounter: Payer: Self-pay | Admitting: Internal Medicine

## 2023-09-04 DIAGNOSIS — I2510 Atherosclerotic heart disease of native coronary artery without angina pectoris: Secondary | ICD-10-CM | POA: Diagnosis not present

## 2023-09-04 DIAGNOSIS — R911 Solitary pulmonary nodule: Secondary | ICD-10-CM | POA: Diagnosis not present

## 2023-09-04 DIAGNOSIS — J439 Emphysema, unspecified: Secondary | ICD-10-CM | POA: Diagnosis not present

## 2023-09-04 DIAGNOSIS — N200 Calculus of kidney: Secondary | ICD-10-CM | POA: Diagnosis not present

## 2023-09-04 DIAGNOSIS — C3490 Malignant neoplasm of unspecified part of unspecified bronchus or lung: Secondary | ICD-10-CM | POA: Diagnosis not present

## 2023-09-04 DIAGNOSIS — C3411 Malignant neoplasm of upper lobe, right bronchus or lung: Secondary | ICD-10-CM

## 2023-09-04 MED ORDER — IOPAMIDOL (ISOVUE-300) INJECTION 61%
100.0000 mL | Freq: Once | INTRAVENOUS | Status: AC | PRN
  Administered 2023-09-04: 100 mL via INTRAVENOUS

## 2023-09-06 ENCOUNTER — Ambulatory Visit: Admission: RE | Admit: 2023-09-06 | Payer: BC Managed Care – PPO | Source: Ambulatory Visit

## 2023-09-10 ENCOUNTER — Encounter: Payer: Self-pay | Admitting: Pulmonary Disease

## 2023-09-10 ENCOUNTER — Encounter: Payer: Self-pay | Admitting: Internal Medicine

## 2023-09-16 ENCOUNTER — Encounter: Payer: Self-pay | Admitting: Internal Medicine

## 2023-09-16 ENCOUNTER — Other Ambulatory Visit: Payer: Self-pay | Admitting: Internal Medicine

## 2023-09-17 ENCOUNTER — Inpatient Hospital Stay: Payer: BC Managed Care – PPO

## 2023-09-17 ENCOUNTER — Inpatient Hospital Stay: Payer: BC Managed Care – PPO | Admitting: Internal Medicine

## 2023-09-17 DIAGNOSIS — S7221XK Displaced subtrochanteric fracture of right femur, subsequent encounter for closed fracture with nonunion: Secondary | ICD-10-CM | POA: Diagnosis not present

## 2023-09-18 ENCOUNTER — Encounter: Payer: Self-pay | Admitting: Hospice and Palliative Medicine

## 2023-09-20 ENCOUNTER — Inpatient Hospital Stay: Payer: BC Managed Care – PPO | Admitting: Hospice and Palliative Medicine

## 2023-09-25 ENCOUNTER — Inpatient Hospital Stay: Payer: BC Managed Care – PPO | Admitting: Internal Medicine

## 2023-09-25 ENCOUNTER — Inpatient Hospital Stay: Payer: BC Managed Care – PPO | Admitting: Hospice and Palliative Medicine

## 2023-09-25 ENCOUNTER — Other Ambulatory Visit: Payer: Self-pay | Admitting: Internal Medicine

## 2023-09-25 ENCOUNTER — Encounter: Payer: Self-pay | Admitting: Internal Medicine

## 2023-09-25 ENCOUNTER — Inpatient Hospital Stay: Payer: BC Managed Care – PPO

## 2023-09-25 MED ORDER — METHYLPREDNISOLONE 4 MG PO TBPK: ORAL_TABLET | ORAL | 0 refills | Status: DC

## 2023-09-25 NOTE — Progress Notes (Signed)
I called in script for Medrol Dosepak.   Please inform patient//wife.  BC- changed appts to 18th

## 2023-09-25 NOTE — Progress Notes (Signed)
Informed.

## 2023-10-07 ENCOUNTER — Telehealth: Payer: Self-pay | Admitting: Internal Medicine

## 2023-10-07 ENCOUNTER — Other Ambulatory Visit: Payer: Self-pay | Admitting: Internal Medicine

## 2023-10-07 NOTE — Telephone Encounter (Signed)
Pt wife called and wants to r/s December appts to Jan 3rd. Dr. B is not here this day and pt wife states they are okay to see Consuello Masse. Please advise on scheduling and if the IS orders can be updated. Please call pt wife with the updates, thank you

## 2023-10-10 ENCOUNTER — Encounter: Payer: Self-pay | Admitting: Pulmonary Disease

## 2023-10-10 ENCOUNTER — Telehealth: Payer: Self-pay | Admitting: Pulmonary Disease

## 2023-10-10 ENCOUNTER — Ambulatory Visit: Payer: BC Managed Care – PPO | Admitting: Pulmonary Disease

## 2023-10-10 VITALS — BP 132/74 | HR 62 | Ht 71.0 in | Wt 248.0 lb

## 2023-10-10 DIAGNOSIS — C4442 Squamous cell carcinoma of skin of scalp and neck: Secondary | ICD-10-CM

## 2023-10-10 DIAGNOSIS — J432 Centrilobular emphysema: Secondary | ICD-10-CM | POA: Diagnosis not present

## 2023-10-10 DIAGNOSIS — C3411 Malignant neoplasm of upper lobe, right bronchus or lung: Secondary | ICD-10-CM

## 2023-10-10 MED ORDER — ALBUTEROL SULFATE (2.5 MG/3ML) 0.083% IN NEBU: 2.5000 mg | INHALATION_SOLUTION | Freq: Four times a day (QID) | RESPIRATORY_TRACT | 12 refills | Status: DC | PRN

## 2023-10-10 MED ORDER — BREZTRI AEROSPHERE 160-9-4.8 MCG/ACT IN AERO: 2.0000 | INHALATION_SPRAY | Freq: Two times a day (BID) | RESPIRATORY_TRACT | 6 refills | Status: DC

## 2023-10-10 MED ORDER — ALBUTEROL SULFATE HFA 108 (90 BASE) MCG/ACT IN AERS: 2.0000 | INHALATION_SPRAY | Freq: Four times a day (QID) | RESPIRATORY_TRACT | 6 refills | Status: DC | PRN

## 2023-10-10 NOTE — Progress Notes (Signed)
Synopsis: Referred in December 2022 for lung nodule by Sherrie Mustache, MD  Subjective:   PATIENT ID: Chris [name] GENDER: male DOB: January 02, 1961, MRN: [id number]  Chief Complaint  Patient presents with   Follow-up    CT follow up 11/6    This is a 62 year old gentleman, past medical history of prostate cancer, tobacco abuse, coronary artery disease, history of stent placement on Plavix.  Patient had a recent scalp lesion removed diagnosed with squamous cell carcinoma.  During this work-up had a CT neck soft tissue which revealed a upper lobe pulmonary nodule concerning for malignancy.  This followed by a nuclear medicine PET scan which revealed a hypermetabolic cervical node, the lesion within the lung hypermetabolic as well as a small lesion within the liver and femoral diathesis.  At this point there was concern of whether or not he potentially had metastatic lung cancer to the skin of the head as well as other locations in the body or are we dealing with multiple separate malignancies.  Discussed case with radiation oncology and are recommending tissue biopsy of the lung nodule.  OV 10/10/2023: Here today for follow-up.  I initially saw the patient at his diagnosis of malignancy in 2022.Recent CT scan of the chest in November 2024 with nodule in the right apex status post radiation treatments.  Tiny subcentimeter metastasis in the right lobe of the liver.  No evidence of new metastasis evidence of bronchial thickening and emphysema.  Last seen by medical oncology.  Office visit in October 2024.  Currently getting treated for stage IV non-small cell adenocarcinoma with right lung apical nodule in addition to metastatic disease to the right hepatic lobe.  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  Respiratory standpoint has nocturnal wheezing.  Has evidence of bronchial thickening on CT imaging and emphysema probably has COPD underlying  diagnosis.    Past Medical History:  Diagnosis Date   Anxiety    associated with medical care,  worsened by difficulty hearing, does better with wife present   Arthritis    Cancer associated pain    Cancer, metastatic to liver [facility])    Complication of anesthesia    wife states very anxious may need pre sedation   Coronary artery disease    Dyspnea    GERD (gastroesophageal reflux disease)    Hearing loss    mild per wife   Hearing loss    no hearing aids per wife   Hyperlipidemia    Hypertension    MI (myocardial infarction) (HCC) 04/17/2006   Acute inferolateral wall MI with cardiogenic shock and complete heart block   Primary adenocarcinoma of upper lobe of right lung (HCC)    Prostate cancer (HCC)    Sleep apnea    Tobacco abuse    Wears glasses    Wears partial dentures    Upper     Family History  Problem Relation Age of Onset   Breast cancer Mother    Prostate cancer Neg Hx    Kidney cancer Neg Hx    Cancer Neg Hx      Past Surgical History:  Procedure Laterality Date   BRONCHIAL BIOPSY  10/09/2021   Procedure: BRONCHIAL BIOPSIES;  Surgeon: Leslye Peer, MD;  Location: [facility] ENDOSCOPY;  Service: Pulmonary;;   BRONCHIAL BRUSHINGS  10/09/2021   Procedure: BRONCHIAL BRUSHINGS;  Surgeon: Leslye Peer, MD;  Location: [facility] ENDOSCOPY;  Service: Pulmonary;;   BRONCHIAL NEEDLE ASPIRATION BIOPSY  10/09/2021   Procedure: BRONCHIAL NEEDLE ASPIRATION BIOPSIES;  Surgeon: Leslye Peer, MD;  Location[facility] ENDOSCOPY;  Service: Pulmonary;;   CARDIAC CATHETERIZATION  2007   left, RCA 100% occluded ruptured plaque with thrombus in the proximal segment   CYST EXCISION N/A 08/30/2021   Procedure: EXCISION OF POSTERIOR SCALP CYST;  Surgeon: Berna Bue, MD;  Location: [facility] ORS;  Service: General;  Laterality: N/A;   CYSTOSCOPY N/A 07/17/2018   Procedure: Bluford Kaufmann;  Surgeon: Marcine Matar, MD;  Location: [facility];  Service: Urology;  Laterality:  N/A;  no seeds in bladder per Dr Retta Diones   FEMUR IM NAIL Right 04/03/2023   Procedure: REVISION FIXATION OF RIGHT FEMUR NONUNION;  Surgeon: Roby Lofts, MD;  Location: [facility] OR;  Service: Orthopedics;  Laterality: Right;   FIDUCIAL MARKER PLACEMENT  10/09/2021   Procedure: FIDUCIAL MARKER PLACEMENT;  Surgeon: Leslye Peer, MD;  Location: [facility] ENDOSCOPY;  Service: Pulmonary;;   HAND SURGERY Right    has metal plate in arm   HARDWARE REMOVAL Right 04/03/2023   Procedure: REMOVAL OF PREVIOUS HARDWARE FEMUR;  Surgeon: Roby Lofts, MD;  Location: [facility] OR;  Service: Orthopedics;  Laterality: Right;   PROSTATE BIOPSY     RADIOACTIVE SEED IMPLANT N/A 07/17/2018   Procedure: RADIOACTIVE SEED IMPLANT/BRACHYTHERAPY IMPLANT;  Surgeon: Marcine Matar, MD;  Location: [facility];  Service: Urology;  Laterality: N/A;  77 seeds   SPACE OAR INSTILLATION N/A 07/17/2018   Procedure: SPACE OAR INSTILLATION;  Surgeon: Marcine Matar, MD;  Location: [facility];  Service: Urology;  Laterality: N/A;   VIDEO BRONCHOSCOPY WITH RADIAL ENDOBRONCHIAL ULTRASOUND  10/09/2021   Procedure: VIDEO BRONCHOSCOPY WITH RADIAL ENDOBRONCHIAL ULTRASOUND;  Surgeon: Leslye Peer, MD;  Location: [facility] ENDOSCOPY;  Service: Pulmonary;;   WRIST SURGERY  10/04/2012    Social History   Socioeconomic History   Marital status: Married    Spouse name: Not on file   Number of children: Not on file   Years of education: Not on file   Highest education level: Not on file  Occupational History   Not on file  Tobacco Use   Smoking status: Every Day    Current packs/day: 0.75    Average packs/day: 0.8 packs/day for 42.0 years (31.5 ttl pk-yrs)    Types: Cigarettes   Smokeless tobacco: Never  Vaping Use   Vaping status: Never Used  Substance and Sexual Activity   Alcohol use: No   Drug use: No   Sexual activity: Yes    Birth control/protection: None  Other Topics Concern   Not on file   Social History Narrative   10-04 19 Unable to ask abuse questions wife with him today.   Are you right handed or left handed? Ambidextrous prominent right   Are you currently employed ? 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 no   Who lives with you? Wife and patient   What type of home do you live in: 1 story or 2 story? 1 story       Social Drivers of Corporate investment banker Strain: Not on file  Food Insecurity: No Food Insecurity (04/03/2023)   Hunger Vital Sign    Worried About Running Out of Food in the Last Year: Never true    Ran Out of Food in the Last Year: Never true  Transportation Needs: No Transportation Needs (04/03/2023)  PRAPARE - Administrator, Civil Service (Medical): No    Lack of Transportation (Non-Medical): No  Physical Activity: Not on file  Stress: Not on file  Social Connections: Not on file  Intimate Partner Violence: Not At Risk (04/03/2023)   Humiliation, Afraid, Rape, and Kick questionnaire    Fear of Current or Ex-Partner: No    Emotionally Abused: No    Physically Abused: No    Sexually Abused: No     No Known Allergies   Outpatient Medications Prior to Visit  Medication Sig Dispense Refill   ALPRAZolam (XANAX) 0.25 MG tablet Take 1 tablet (0.25 mg total) by mouth at bedtime. (Patient taking differently: Take 0.25 mg by mouth at bedtime as needed for anxiety.) 15 tablet 0   atorvastatin (LIPITOR) 20 MG tablet TAKE 1 TABLET BY MOUTH EVERY DAY 90 tablet 3   clopidogrel (PLAVIX) 75 MG tablet TAKE 1 TABLET BY MOUTH EVERY DAY 90 tablet 2   fluticasone (FLONASE) 50 MCG/ACT nasal spray Place 1 spray into both nostrils daily as needed for allergies.     ketoconazole (NIZORAL) 2 % cream Apply 1 application  topically 2 (two) times daily as needed for irritation.     methylPREDNISolone (MEDROL DOSEPAK) 4 MG TBPK tablet Use as directed. 21 tablet 1   metoprolol tartrate (LOPRESSOR) 25 MG tablet  TAKE 1 TABLET (25 MG TOTAL) BY MOUTH DAILY. 90 tablet 2   oxyCODONE ER (XTAMPZA ER) 9 MG C12A Take 1 capsule by mouth every 12 (twelve) hours. 60 capsule 0   Oxycodone HCl 10 MG TABS Take 0.5-1 tablets (5-10 mg total) by mouth every 4 (four) hours as needed. 60 tablet 0   pantoprazole (PROTONIX) 40 MG tablet Take 1 tablet (40 mg total) by mouth daily. 60 tablet 1   sennosides-docusate sodium (SENOKOT-S) 8.6-50 MG tablet Take 1 tablet by mouth daily. 60 tablet 1   sertraline (ZOLOFT) 100 MG tablet TAKE 1 TABLET BY MOUTH EVERY DAY 90 tablet 0   tamsulosin (FLOMAX) 0.4 MG CAPS capsule Take 1 capsule (0.4 mg total) by mouth daily. 30 capsule 3   Vitamin D, Ergocalciferol, (DRISDOL) 1.25 MG (50000 UNIT) CAPS capsule TAKE 1 CAPSULE BY MOUTH ONE TIME PER WEEK 12 capsule 1   methylPREDNISolone (MEDROL DOSEPAK) 4 MG TBPK tablet Use as directed. 21 tablet 0   No facility-administered medications prior to visit.    Review of Systems  Constitutional:  Negative for chills, fever, malaise/fatigue and weight loss.  HENT:  Negative for hearing loss, sore throat and tinnitus.   Eyes:  Negative for blurred vision and double vision.  Respiratory:  Positive for cough, shortness of breath and wheezing. Negative for hemoptysis, sputum production and stridor.   Cardiovascular:  Negative for chest pain, palpitations, orthopnea, leg swelling and PND.  Gastrointestinal:  Negative for abdominal pain, constipation, diarrhea, heartburn, nausea and vomiting.  Genitourinary:  Negative for dysuria, hematuria and urgency.  Musculoskeletal:  Negative for joint pain and myalgias.  Skin:  Negative for itching and rash.  Neurological:  Negative for dizziness, tingling, weakness and headaches.  Endo/Heme/Allergies:  Negative for environmental allergies. Does not bruise/bleed easily.  Psychiatric/Behavioral:  Negative for depression. The patient is not nervous/anxious and does not have insomnia.   All other systems reviewed and  are negative.    Objective:  Physical Exam Vitals reviewed.  Constitutional:      General: He is not in acute distress.    Appearance: He is well-developed. He is obese.  HENT:     Head: Normocephalic and atraumatic.  Eyes:     General: No scleral icterus.    Conjunctiva/sclera: Conjunctivae normal.     Pupils: Pupils are equal, round, and reactive to light.  Neck:     Vascular: No JVD.     Trachea: No tracheal deviation.  Cardiovascular:     Rate and Rhythm: Normal rate and regular rhythm.     Heart sounds: Normal heart sounds. No murmur heard. Pulmonary:     Effort: Pulmonary effort is normal. No tachypnea, accessory muscle usage or respiratory distress.     Breath sounds: No stridor. No wheezing, rhonchi or rales.     Comments: Diminished breath sounds bilaterally, faint crackles in the bases. Abdominal:     General: There is no distension.     Palpations: Abdomen is soft.     Tenderness: There is no abdominal tenderness.     Comments: Obese abdomen  Musculoskeletal:        General: No tenderness.     Cervical back: Neck supple.  Lymphadenopathy:     Cervical: No cervical adenopathy.  Skin:    General: Skin is warm and dry.     Capillary Refill: Capillary refill takes less than 2 seconds.     Findings: No rash.  Neurological:     Mental Status: He is alert and oriented to person, place, and time.  Psychiatric:        Behavior: Behavior normal.      Vitals:   10/10/23 1501  BP: 132/74  Pulse: 62  SpO2: 97%  Weight: 248 lb (112.5 kg)  Height: 5\' 11"  (1.803 m)    97% on RA BMI Readings from Last 3 Encounters:  10/10/23 34.59 kg/m  08/27/23 34.20 kg/m  08/07/23 34.14 kg/m   Wt Readings from Last 3 Encounters:  10/10/23 248 lb (112.5 kg)  08/27/23 245 lb 3.2 oz (111.2 kg)  08/07/23 244 lb 12.8 oz (111 kg)     CBC    Component Value Date/Time   WBC 5.6 08/27/2023 1331   WBC 6.2 08/07/2023 0842   RBC 5.05 08/27/2023 1331   HGB 14.5 08/27/2023  1331   HCT 44.4 08/27/2023 1331   PLT 202 08/27/2023 1331   MCV 87.9 08/27/2023 1331   MCH 28.7 08/27/2023 1331   MCHC 32.7 08/27/2023 1331   RDW 15.2 08/27/2023 1331   LYMPHSABS 1.0 08/27/2023 1331   MONOABS 0.6 08/27/2023 1331   EOSABS 0.1 08/27/2023 1331   BASOSABS 0.0 08/27/2023 1331    Chest Imaging: 09/28/2021 nuclear medicine pet imaging: Hypermetabolic lung lesion concerning for primary bronchogenic carcinoma, also hypermetabolic lesion within the liver femoral diaphysis and cervical node.,  Known lesion within the posterior right head. The patient's images have been independently reviewed by me.    Pulmonary Functions Testing Results:     No data to display          FeNO:   Pathology:   Echocardiogram:   Heart Catheterization:     Assessment & Plan:     ICD-10-CM   1. Primary adenocarcinoma of upper lobe of right lung (HCC)  C34.11     2. Squamous cell carcinoma of scalp  C44.42     3. Centrilobular emphysema (HCC)  J43.2        Discussion:  This is a 62 year old gentleman longstanding history of prior tobacco use, new diagnosis of adenocarcinoma of the lung and squamous cell carcinoma of the scalp currently getting treatments with  oncology.  Recent CT imaging with evidence of emphysema.  Likely has an underlying diagnosis of COPD.  No prior PFTs not on any inhalers.  Plan: Start albuterol as needed, albuterol nebulizer as needed Start Breztri Follow-up with [place] in 3 months to see if his respiratory symptoms are improved. At that time could consider having pulmonary function tests prior to office visitOr office by spirometry.    Current Outpatient Medications:    albuterol (PROVENTIL) (2.5 MG/3ML) 0.083% nebulizer solution, Take 3 mLs (2.5 mg total) by nebulization every 6 (six) hours as needed for wheezing or shortness of breath., Disp: 75 mL, Rfl: 12   albuterol (VENTOLIN HFA) 108 (90 Base) MCG/ACT inhaler, Inhale 2 puffs into the lungs every 6  (six) hours as needed for wheezing or shortness of breath., Disp: 8 g, Rfl: 6   ALPRAZolam (XANAX) 0.25 MG tablet, Take 1 tablet (0.25 mg total) by mouth at bedtime. (Patient taking differently: Take 0.25 mg by mouth at bedtime as needed for anxiety.), Disp: 15 tablet, Rfl: 0   atorvastatin (LIPITOR) 20 MG tablet, TAKE 1 TABLET BY MOUTH EVERY DAY, Disp: 90 tablet, Rfl: 3   Budeson-Glycopyrrol-Formoterol (BREZTRI AEROSPHERE) 160-9-4.8 MCG/ACT AERO, Inhale 2 puffs into the lungs in the morning and at bedtime., Disp: 10.7 g, Rfl: 6   clopidogrel (PLAVIX) 75 MG tablet, TAKE 1 TABLET BY MOUTH EVERY DAY, Disp: 90 tablet, Rfl: 2   fluticasone (FLONASE) 50 MCG/ACT nasal spray, Place 1 spray into both nostrils daily as needed for allergies., Disp: , Rfl:    ketoconazole (NIZORAL) 2 % cream, Apply 1 application  topically 2 (two) times daily as needed for irritation., Disp: , Rfl:    methylPREDNISolone (MEDROL DOSEPAK) 4 MG TBPK tablet, Use as directed., Disp: 21 tablet, Rfl: 1   metoprolol tartrate (LOPRESSOR) 25 MG tablet, TAKE 1 TABLET (25 MG TOTAL) BY MOUTH DAILY., Disp: 90 tablet, Rfl: 2   oxyCODONE ER (XTAMPZA ER) 9 MG C12A, Take 1 capsule by mouth every 12 (twelve) hours., Disp: 60 capsule, Rfl: 0   Oxycodone HCl 10 MG TABS, Take 0.5-1 tablets (5-10 mg total) by mouth every 4 (four) hours as needed., Disp: 60 tablet, Rfl: 0   pantoprazole (PROTONIX) 40 MG tablet, Take 1 tablet (40 mg total) by mouth daily., Disp: 60 tablet, Rfl: 1   sennosides-docusate sodium (SENOKOT-S) 8.6-50 MG tablet, Take 1 tablet by mouth daily., Disp: 60 tablet, Rfl: 1   sertraline (ZOLOFT) 100 MG tablet, TAKE 1 TABLET BY MOUTH EVERY DAY, Disp: 90 tablet, Rfl: 0   tamsulosin (FLOMAX) 0.4 MG CAPS capsule, Take 1 capsule (0.4 mg total) by mouth daily., Disp: 30 capsule, Rfl: 3   Vitamin D, Ergocalciferol, (DRISDOL) 1.25 MG (50000 UNIT) CAPS capsule, TAKE 1 CAPSULE BY MOUTH ONE TIME PER WEEK, Disp: 12 capsule, Rfl: 1    methylPREDNISolone (MEDROL DOSEPAK) 4 MG TBPK tablet, Use as directed., Disp: 21 tablet, Rfl: 0    Josephine Igo, DO [facility] 10/10/2023 3:18 PM

## 2023-10-10 NOTE — Telephone Encounter (Signed)
Patient states [facility] does not have Breztri in stock. Would like to know what pharmacy to get [facility]. Patient phone number is [phone].

## 2023-10-10 NOTE — Patient Instructions (Signed)
Thank you for visiting Dr. Tonia Brooms at [facility]. Today we recommend the following:  Breztri inhaler samples  Meds ordered this encounter  Medications   Budeson-Glycopyrrol-Formoterol (BREZTRI AEROSPHERE) 160-9-4.8 MCG/ACT AERO    Sig: Inhale 2 puffs into the lungs in the morning and at bedtime.    Dispense:  10.7 g    Refill:  6   albuterol (VENTOLIN HFA) 108 (90 Base) MCG/ACT inhaler    Sig: Inhale 2 puffs into the lungs every 6 (six) hours as needed for wheezing or shortness of breath.    Dispense:  8 g    Refill:  6   albuterol (PROVENTIL) (2.5 MG/3ML) 0.083% nebulizer solution    Sig: Take 3 mLs (2.5 mg total) by nebulization every 6 (six) hours as needed for wheezing or shortness of breath.    Dispense:  75 mL    Refill:  12   Return in about 3 months (around 01/08/2024) for with APP or Dr. Tonia Brooms.    Please do your part to reduce the spread of COVID-19.

## 2023-10-11 NOTE — Telephone Encounter (Signed)
Called and spoke with pts wife, who states she was wrong and did receive breztri. Nfn

## 2023-10-14 ENCOUNTER — Encounter: Payer: Self-pay | Admitting: Internal Medicine

## 2023-10-15 ENCOUNTER — Encounter: Payer: Self-pay | Admitting: Pulmonary Disease

## 2023-10-15 DIAGNOSIS — J432 Centrilobular emphysema: Secondary | ICD-10-CM

## 2023-10-16 ENCOUNTER — Ambulatory Visit: Payer: BC Managed Care – PPO | Admitting: Internal Medicine

## 2023-10-16 ENCOUNTER — Other Ambulatory Visit: Payer: BC Managed Care – PPO

## 2023-10-16 ENCOUNTER — Encounter (HOSPITAL_BASED_OUTPATIENT_CLINIC_OR_DEPARTMENT_OTHER): Payer: Self-pay

## 2023-10-16 ENCOUNTER — Ambulatory Visit: Payer: BC Managed Care – PPO

## 2023-10-16 ENCOUNTER — Encounter: Payer: BC Managed Care – PPO | Admitting: Hospice and Palliative Medicine

## 2023-10-17 ENCOUNTER — Ambulatory Visit (HOSPITAL_BASED_OUTPATIENT_CLINIC_OR_DEPARTMENT_OTHER): Payer: BC Managed Care – PPO | Admitting: Student

## 2023-10-17 ENCOUNTER — Ambulatory Visit (HOSPITAL_BASED_OUTPATIENT_CLINIC_OR_DEPARTMENT_OTHER): Payer: BC Managed Care – PPO

## 2023-10-17 ENCOUNTER — Encounter (HOSPITAL_BASED_OUTPATIENT_CLINIC_OR_DEPARTMENT_OTHER): Payer: Self-pay | Admitting: Student

## 2023-10-17 DIAGNOSIS — S7221XK Displaced subtrochanteric fracture of right femur, subsequent encounter for closed fracture with nonunion: Secondary | ICD-10-CM

## 2023-10-17 NOTE — Progress Notes (Signed)
Chief Complaint: Right hip pain     History of Present Illness:    Chris [name] is a pleasant 62 y.o. male here today for evaluation of pain in his right leg.  Patient has known metastatic lung cancer and underwent hardware removal and revision fixation with Dr. Jena Gauss on 04/03/2023 after he was found to have a hardware failure and subtrochanteric fracture nonunion.  Today he reports that approximately 4 days ago he began developing pain around the distal right femur area without any known injury or cause.  He states he was unable to weight-bear due to pain.  He does have pain medication through palliative care and states that over the past few days he has been able to get improvements in his pain to the point where he has been able to partially weight-bear.  Denies any significant pain in the groin/hip area or radiating past the knee.  He does use a cane for ambulation at baseline.  Surgical History:   Right femoral IM nail 09/18/2022 Right femur revision fixation and hardware removal 04/03/2023  PMH/PSH/Family History/Social History/Meds/Allergies:    Past Medical History:  Diagnosis Date   Anxiety    associated with medical care,  worsened by difficulty hearing, does better with wife present   Arthritis    Cancer associated pain    Cancer, metastatic to liver [facility])    Complication of anesthesia    wife states very anxious may need pre sedation   Coronary artery disease    Dyspnea    GERD (gastroesophageal reflux disease)    Hearing loss    mild per wife   Hearing loss    no hearing aids per wife   Hyperlipidemia    Hypertension    MI (myocardial infarction) (HCC) 04/17/2006   Acute inferolateral wall MI with cardiogenic shock and complete heart block   Primary adenocarcinoma of upper lobe of right lung (HCC)    Prostate cancer (HCC)    Sleep apnea    Tobacco abuse    Wears glasses    Wears partial dentures    Upper   Past Surgical  History:  Procedure Laterality Date   BRONCHIAL BIOPSY  10/09/2021   Procedure: BRONCHIAL BIOPSIES;  Surgeon: Leslye Peer, MD;  [facility]: [facility];  Service: Pulmonary;;   BRONCHIAL BRUSHINGS  10/09/2021   Procedure: BRONCHIAL BRUSHINGS;  Surgeon: Leslye Peer, MD;  Location: [facility] ENDOSCOPY;  Service: Pulmonary;;   BRONCHIAL NEEDLE ASPIRATION BIOPSY  10/09/2021   Procedure: BRONCHIAL NEEDLE ASPIRATION BIOPSIES;  Surgeon: Leslye Peer, MD;  [facility]: [facility];  Service: Pulmonary;;   CARDIAC CATHETERIZATION  2007   left, RCA 100% occluded ruptured plaque with thrombus in the proximal segment   CYST EXCISION N/A 08/30/2021   Procedure: EXCISION OF POSTERIOR SCALP CYST;  Surgeon: Berna Bue, MD;  Location: [facility] ORS;  Service: General;  Laterality: N/A;   CYSTOSCOPY N/A 07/17/2018   Procedure: Bluford Kaufmann;  Surgeon: Marcine Matar, MD;  Location: [facility];  Service: Urology;  Laterality: N/A;  no seeds in bladder per Dr Retta Diones   FEMUR IM NAIL Right 04/03/2023   Procedure: REVISION FIXATION OF RIGHT FEMUR NONUNION;  Surgeon: Roby Lofts, MD;  Location: [facility] OR;  Service: Orthopedics;  Laterality: Right;   FIDUCIAL MARKER PLACEMENT  10/09/2021  Procedure: FIDUCIAL MARKER PLACEMENT;  Surgeon: Leslye Peer, MD;  Location: [facility] ENDOSCOPY;  Service: Pulmonary;;   HAND SURGERY Right    has metal plate in arm   HARDWARE REMOVAL Right 04/03/2023   Procedure: REMOVAL OF PREVIOUS HARDWARE FEMUR;  Surgeon: Roby Lofts, MD;  [facility]: [facility] OR;  Service: Orthopedics;  Laterality: Right;   PROSTATE BIOPSY     RADIOACTIVE SEED IMPLANT N/A 07/17/2018   Procedure: RADIOACTIVE SEED IMPLANT/BRACHYTHERAPY IMPLANT;  Surgeon: Marcine Matar, MD;  Location: [facility];  Service: Urology;  Laterality: N/A;  77 seeds   SPACE OAR INSTILLATION N/A 07/17/2018   Procedure: SPACE OAR INSTILLATION;  Surgeon: Marcine Matar, MD;  Location: [facility];  Service: Urology;  Laterality: N/A;   VIDEO BRONCHOSCOPY WITH RADIAL ENDOBRONCHIAL ULTRASOUND  10/09/2021   Procedure: VIDEO BRONCHOSCOPY WITH RADIAL ENDOBRONCHIAL ULTRASOUND;  Surgeon: Leslye Peer, MD;  [facility]: [facility];  Service: Pulmonary;;   WRIST SURGERY  10/04/2012   Social History   Socioeconomic History   Marital status: Married    Spouse name: Not on file   Number of children: Not on file   Years of education: Not on file   Highest education level: Not on file  Occupational History   Not on file  Tobacco Use   Smoking status: Every Day    Current packs/day: 0.75    Average packs/day: 0.8 packs/day for 42.0 years (31.5 ttl pk-yrs)    Types: Cigarettes   Smokeless tobacco: Never  Vaping Use   Vaping status: Never Used  Substance and Sexual Activity   Alcohol use: No   Drug use: No   Sexual activity: Yes    Birth control/protection: None  Other Topics Concern   Not on file  Social History Narrative   10-04 19 Unable to ask abuse questions wife with him today.   Are you right handed or left handed? Ambidextrous prominent right   Are you currently employed ? 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 no   Who lives with you? Wife and patient   What type of home do you live in: 1 story or 2 story? 1 story       Social Drivers of Corporate investment banker Strain: Not on file  Food Insecurity: No Food Insecurity (04/03/2023)   Hunger Vital Sign    Worried About Running Out of Food in the Last Year: Never true    Ran Out of Food in the Last Year: Never true  Transportation Needs: No Transportation Needs (04/03/2023)   PRAPARE - Administrator, Civil Service (Medical): No    Lack of Transportation (Non-Medical): No  Physical Activity: Not on file  Stress: Not on file  Social Connections: Not on file   Family History  Problem Relation Age of Onset   Breast cancer Mother    Prostate  cancer Neg Hx    Kidney cancer Neg Hx    Cancer Neg Hx    No Known Allergies Current Outpatient Medications  Medication Sig Dispense Refill   albuterol (PROVENTIL) (2.5 MG/3ML) 0.083% nebulizer solution Take 3 mLs (2.5 mg total) by nebulization every 6 (six) hours as needed for wheezing or shortness of breath. 75 mL 12   albuterol (VENTOLIN HFA) 108 (90 Base) MCG/ACT inhaler Inhale 2 puffs into the lungs every 6 (six) hours as needed for wheezing or shortness of breath. 8 g  6   ALPRAZolam (XANAX) 0.25 MG tablet Take 1 tablet (0.25 mg total) by mouth at bedtime. (Patient taking differently: Take 0.25 mg by mouth at bedtime as needed for anxiety.) 15 tablet 0   atorvastatin (LIPITOR) 20 MG tablet TAKE 1 TABLET BY MOUTH EVERY DAY 90 tablet 3   Budeson-Glycopyrrol-Formoterol (BREZTRI AEROSPHERE) 160-9-4.8 MCG/ACT AERO Inhale 2 puffs into the lungs in the morning and at bedtime. 10.7 g 6   clopidogrel (PLAVIX) 75 MG tablet TAKE 1 TABLET BY MOUTH EVERY DAY 90 tablet 2   fluticasone (FLONASE) 50 MCG/ACT nasal spray Place 1 spray into both nostrils daily as needed for allergies.     ketoconazole (NIZORAL) 2 % cream Apply 1 application  topically 2 (two) times daily as needed for irritation.     methylPREDNISolone (MEDROL DOSEPAK) 4 MG TBPK tablet Use as directed. 21 tablet 1   methylPREDNISolone (MEDROL DOSEPAK) 4 MG TBPK tablet Use as directed. 21 tablet 0   metoprolol tartrate (LOPRESSOR) 25 MG tablet TAKE 1 TABLET (25 MG TOTAL) BY MOUTH DAILY. 90 tablet 2   oxyCODONE ER (XTAMPZA ER) 9 MG C12A Take 1 capsule by mouth every 12 (twelve) hours. 60 capsule 0   Oxycodone HCl 10 MG TABS Take 0.5-1 tablets (5-10 mg total) by mouth every 4 (four) hours as needed. 60 tablet 0   pantoprazole (PROTONIX) 40 MG tablet Take 1 tablet (40 mg total) by mouth daily. 60 tablet 1   sennosides-docusate sodium (SENOKOT-S) 8.6-50 MG tablet Take 1 tablet by mouth daily. 60 tablet 1   sertraline (ZOLOFT) 100 MG tablet TAKE 1  TABLET BY MOUTH EVERY DAY 90 tablet 0   tamsulosin (FLOMAX) 0.4 MG CAPS capsule Take 1 capsule (0.4 mg total) by mouth daily. 30 capsule 3   Vitamin D, Ergocalciferol, (DRISDOL) 1.25 MG (50000 UNIT) CAPS capsule TAKE 1 CAPSULE BY MOUTH ONE TIME PER WEEK 12 capsule 1   No current facility-administered medications for this visit.   No results found.  Review of Systems:   A ROS was performed including pertinent positives and negatives as documented in the HPI.  Physical Exam :   Constitutional: NAD and appears stated age Neurological: Alert and oriented Psych: Appropriate affect and cooperative There were no vitals taken for this visit.   Comprehensive Musculoskeletal Exam:    Right leg exam demonstrates tenderness and mild swelling noted over the lateral distal femur surrounding prior surgical and visions.  No evidence of any significant erythema or ecchymosis.  Able to perform active knee flexion extension as well as ankle dorsiflexion plantarflexion.  He is neurovascularly intact distally.  Imaging:   Xray (right femur and AP pelvis): There is some healing surrounding the chronic subtrochanteric fracture without any further displacement compared to radiographs on 04/03/2023.  No fracture is seen distally however there does appear to be slight angulation of the more proximal distal locking screw.   I personally reviewed and interpreted the radiographs.   Assessment:   62 y.o. male with pain of the right distal femur over the last 4 days.  Overall his symptoms have improved some, but still has difficulty with weightbearing.  Review of x-rays taken today does show angulation of one of the distal locking screws of the IM nail suggesting possible breakage as this was not present on postop radiographs in June.  Unable to see x-rays taken over the past few months for comparison.  I did discuss that this was negative for any acute fracture or other obvious hardware  complication.  Will plan to  have him remain partial weightbearing and follow-up with Dr. Luvenia Starch office.  Patient and his wife are in understanding and agreement of plan.  Plan :    -Continue with limited weightbearing and follow-up with Dr. Jena Gauss     I personally saw and evaluated the patient, and participated in the management and treatment plan.  Hazle Nordmann, PA-C Orthopedics

## 2023-10-31 ENCOUNTER — Encounter: Payer: Self-pay | Admitting: Internal Medicine

## 2023-11-01 ENCOUNTER — Inpatient Hospital Stay: Payer: 59

## 2023-11-01 ENCOUNTER — Encounter: Payer: Self-pay | Admitting: Nurse Practitioner

## 2023-11-01 ENCOUNTER — Other Ambulatory Visit: Payer: Self-pay | Admitting: Internal Medicine

## 2023-11-01 ENCOUNTER — Inpatient Hospital Stay (HOSPITAL_BASED_OUTPATIENT_CLINIC_OR_DEPARTMENT_OTHER): Payer: 59 | Admitting: Nurse Practitioner

## 2023-11-01 ENCOUNTER — Inpatient Hospital Stay: Payer: 59 | Attending: Internal Medicine

## 2023-11-01 ENCOUNTER — Inpatient Hospital Stay (HOSPITAL_BASED_OUTPATIENT_CLINIC_OR_DEPARTMENT_OTHER): Payer: 59 | Admitting: Hospice and Palliative Medicine

## 2023-11-01 VITALS — BP 108/64 | HR 77 | Temp 97.2°F | Wt 244.0 lb

## 2023-11-01 DIAGNOSIS — C7951 Secondary malignant neoplasm of bone: Secondary | ICD-10-CM | POA: Diagnosis present

## 2023-11-01 DIAGNOSIS — C3411 Malignant neoplasm of upper lobe, right bronchus or lung: Secondary | ICD-10-CM

## 2023-11-01 DIAGNOSIS — Z8546 Personal history of malignant neoplasm of prostate: Secondary | ICD-10-CM | POA: Insufficient documentation

## 2023-11-01 DIAGNOSIS — Z5111 Encounter for antineoplastic chemotherapy: Secondary | ICD-10-CM | POA: Insufficient documentation

## 2023-11-01 DIAGNOSIS — C787 Secondary malignant neoplasm of liver and intrahepatic bile duct: Secondary | ICD-10-CM | POA: Insufficient documentation

## 2023-11-01 DIAGNOSIS — C4442 Squamous cell carcinoma of skin of scalp and neck: Secondary | ICD-10-CM | POA: Insufficient documentation

## 2023-11-01 DIAGNOSIS — F17210 Nicotine dependence, cigarettes, uncomplicated: Secondary | ICD-10-CM | POA: Diagnosis not present

## 2023-11-01 DIAGNOSIS — Z5112 Encounter for antineoplastic immunotherapy: Secondary | ICD-10-CM | POA: Diagnosis not present

## 2023-11-01 DIAGNOSIS — G893 Neoplasm related pain (acute) (chronic): Secondary | ICD-10-CM

## 2023-11-01 DIAGNOSIS — Z79899 Other long term (current) drug therapy: Secondary | ICD-10-CM | POA: Diagnosis not present

## 2023-11-01 DIAGNOSIS — Z515 Encounter for palliative care: Secondary | ICD-10-CM | POA: Diagnosis not present

## 2023-11-01 LAB — COMPREHENSIVE METABOLIC PANEL
ALT: 18 U/L (ref 0–44)
AST: 19 U/L (ref 15–41)
Albumin: 4.1 g/dL (ref 3.5–5.0)
Alkaline Phosphatase: 81 U/L (ref 38–126)
Anion gap: 9 (ref 5–15)
BUN: 18 mg/dL (ref 8–23)
CO2: 21 mmol/L — ABNORMAL LOW (ref 22–32)
Calcium: 8.8 mg/dL — ABNORMAL LOW (ref 8.9–10.3)
Chloride: 108 mmol/L (ref 98–111)
Creatinine, Ser: 0.98 mg/dL (ref 0.61–1.24)
GFR, Estimated: >60 mL/min (ref >60)
Glucose, Bld: 107 mg/dL — ABNORMAL HIGH (ref 70–99)
Potassium: 3.7 mmol/L (ref 3.5–5.1)
Sodium: 138 mmol/L (ref 135–145)
Total Bilirubin: 0.7 mg/dL (ref 0.0–1.2)
Total Protein: 7 g/dL (ref 6.5–8.1)

## 2023-11-01 LAB — TSH: TSH: 1.245 u[IU]/mL (ref 0.350–4.500)

## 2023-11-01 LAB — CBC WITH DIFFERENTIAL/PLATELET
Abs Immature Granulocytes: 0.01 10*3/uL (ref 0.00–0.07)
Basophils Absolute: 0 10*3/uL (ref 0.0–0.1)
Basophils Relative: 0 %
Eosinophils Absolute: 0.1 10*3/uL (ref 0.0–0.5)
Eosinophils Relative: 1 %
HCT: 44.6 % (ref 39.0–52.0)
Hemoglobin: 14.9 g/dL (ref 13.0–17.0)
Immature Granulocytes: 0 %
Lymphocytes Relative: 21 %
Lymphs Abs: 1.4 10*3/uL (ref 0.7–4.0)
MCH: 29.2 pg (ref 26.0–34.0)
MCHC: 33.4 g/dL (ref 30.0–36.0)
MCV: 87.5 fL (ref 80.0–100.0)
Monocytes Absolute: 0.8 10*3/uL (ref 0.1–1.0)
Monocytes Relative: 12 %
Neutro Abs: 4.4 10*3/uL (ref 1.7–7.7)
Neutrophils Relative %: 66 %
Platelets: 221 10*3/uL (ref 150–400)
RBC: 5.1 MIL/uL (ref 4.22–5.81)
RDW: 14.6 % (ref 11.5–15.5)
WBC: 6.7 10*3/uL (ref 4.0–10.5)
nRBC: 0 % (ref 0.0–0.2)

## 2023-11-01 MED ORDER — SODIUM CHLORIDE 0.9 % IV SOLN
Freq: Once | INTRAVENOUS | Status: AC
  Filled 2023-11-01: qty 250

## 2023-11-01 MED ORDER — OXYCODONE HCL 10 MG PO TABS: 5.0000 mg | ORAL_TABLET | ORAL | 0 refills | Status: DC | PRN

## 2023-11-01 MED ORDER — CEMIPLIMAB-RWLC CHEMO INJECTION 350 MG/7ML
350.0000 mg | Freq: Once | INTRAVENOUS | Status: AC
  Administered 2023-11-01: 350 mg via INTRAVENOUS
  Filled 2023-11-01: qty 7

## 2023-11-01 NOTE — Progress Notes (Signed)
 Palliative Medicine [facility] at [facility] [phone] [phone]   Name: Chris [name] Date: 11/01/2023 MRN: [id number]  DOB: 22-Sep-1961  Patient Care Team: Weyman Bright, MD as PCP - General (Internal Medicine) Court Dorn PARAS, MD as PCP - Cardiology (Cardiology) Sherrod Sherrod, MD as Consulting Physician (Oncology) Weyman Bright, MD as Referring Physician (Internal Medicine) Rennie Cindy SAUNDERS, MD as Consulting Physician (Internal Medicine) Rennie Cindy SAUNDERS, MD as Consulting Physician (Internal Medicine)    REASON FOR CONSULTATION: Chris [name] is a 63 y.o. male with multiple medical problems including stage IV non-small cell lung cancer with metastasis to femur/liver diagnosed in December 2022.  Patient is status post systemic chemotherapy and most recently on maintenance single agent Libtayo . He is status post right hip XRT.  Patient was found to have impending pathologic fracture of the right femur and underwent surgical pinning at [facility] by Ortho oncology.  SOCIAL HISTORY:     reports that he has been smoking cigarettes. He has a 31.5 pack-year smoking history. He has never used smokeless tobacco. He reports that he does not drink alcohol  and does not use drugs.  Patient is married and lives at home with his wife.  They have a son and recently had grandchildren.  Patient works in market researcher.  ADVANCE DIRECTIVES:  Not on file  CODE STATUS: Limited Code (CPR but no intubation, MOST form signed on 04/04/22)  PAST MEDICAL HISTORY: Past Medical History:  Diagnosis Date   Anxiety    associated with medical care,  worsened by difficulty hearing, does better with wife present   Arthritis    Cancer associated pain    Cancer, metastatic to liver [facility])    Complication of anesthesia    wife states very anxious may need pre sedation   Coronary artery disease    Dyspnea    GERD (gastroesophageal reflux  disease)    Hearing loss    mild per wife   Hearing loss    no hearing aids per wife   Hyperlipidemia    Hypertension    MI (myocardial infarction) (HCC) 04/17/2006   Acute inferolateral wall MI with cardiogenic shock and complete heart block   Primary adenocarcinoma of upper lobe of right lung (HCC)    Prostate cancer (HCC)    Sleep apnea    Tobacco abuse    Wears glasses    Wears partial dentures    Upper    PAST SURGICAL HISTORY:  Past Surgical History:  Procedure Laterality Date   BRONCHIAL BIOPSY  10/09/2021   Procedure: BRONCHIAL BIOPSIES;  Surgeon: Shelah Lamar RAMAN, MD;  Location: [facility];  Service: Pulmonary;;   BRONCHIAL BRUSHINGS  10/09/2021   Procedure: BRONCHIAL BRUSHINGS;  Surgeon: Shelah Lamar RAMAN, MD;  Location: [facility] ENDOSCOPY;  Service: Pulmonary;;   BRONCHIAL NEEDLE ASPIRATION BIOPSY  10/09/2021   Procedure: BRONCHIAL NEEDLE ASPIRATION BIOPSIES;  Surgeon: Shelah Lamar RAMAN, MD;  Location: [facility];  Service: Pulmonary;;   CARDIAC CATHETERIZATION  2007   left, RCA 100% occluded ruptured plaque with thrombus in the proximal segment   CYST EXCISION N/A 08/30/2021   Procedure: EXCISION OF POSTERIOR SCALP CYST;  Surgeon: Signe Mitzie LABOR, MD;  Location: [facility];  Service: General;  Laterality: N/A;   CYSTOSCOPY N/A 07/17/2018   Procedure: PHYLLIS;  Surgeon: Matilda Senior, MD;  Location: [facility];  Service: Urology;  Laterality: N/A;  no seeds in bladder per Dr Matilda   FEMUR IM NAIL  Right 04/03/2023   Procedure: REVISION FIXATION OF RIGHT FEMUR NONUNION;  Surgeon: Kendal Franky SQUIBB, MD;  Location: [facility] OR;  Service: Orthopedics;  Laterality: Right;   FIDUCIAL MARKER PLACEMENT  10/09/2021   Procedure: FIDUCIAL MARKER PLACEMENT;  Surgeon: Shelah Lamar RAMAN, MD;  Location: [facility] ENDOSCOPY;  Service: Pulmonary;;   HAND SURGERY Right    has metal plate in arm   HARDWARE REMOVAL Right 04/03/2023   Procedure: REMOVAL OF PREVIOUS HARDWARE FEMUR;   Surgeon: Kendal Franky SQUIBB, MD;  Location: [facility] OR;  Service: Orthopedics;  Laterality: Right;   PROSTATE BIOPSY     RADIOACTIVE SEED IMPLANT N/A 07/17/2018   Procedure: RADIOACTIVE SEED IMPLANT/BRACHYTHERAPY IMPLANT;  Surgeon: Matilda Senior, MD;  Location[facility];  Service: Urology;  Laterality: N/A;  77 seeds   SPACE OAR INSTILLATION N/A 07/17/2018   Procedure: SPACE OAR INSTILLATION;  Surgeon: Matilda Senior, MD;  Location: [facility];  Service: Urology;  Laterality: N/A;   VIDEO BRONCHOSCOPY WITH RADIAL ENDOBRONCHIAL ULTRASOUND  10/09/2021   Procedure: VIDEO BRONCHOSCOPY WITH RADIAL ENDOBRONCHIAL ULTRASOUND;  Surgeon: Shelah Lamar RAMAN, MD;  Location: [facility];  Service: Pulmonary;;   WRIST SURGERY  10/04/2012    HEMATOLOGY/ONCOLOGY HISTORY:  Oncology History Overview Note  DIAGNOSIS: 1) stage IV (T1c, N0, M1 C) non-small cell lung cancer favoring adenocarcinoma presented with right lung apical nodule in addition to metastatic disease in the right hepatic lobe and the proximal right femoral diaphysis diagnosed and December 2022. 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    QNS; Guardant 360 showed positive KRAS G12C mutation  FINAL MICROSCOPIC DIAGNOSIS:   A. LUNG, RUL, FINE NEEDLE ASPIRATION:  - Malignant cells consistent with non-small cell carcinoma, see comment   B. LUNG, RUL, BRUSHING:  - Malignant cells consistent with non-small cell carcinoma, see comment       COMMENT:   A and B.  Dr. Belvie reviewed the case and concurs with the diagnosis.  Only rare malignant cells are present on the cellblock.  Immunohistochemical stains were attempted and show that the tumor cells  have patchy staining for TTF-1 whereas p63, p40 and CK5/6 are negative.  The findings are nondiagnostic but suggestive of a lung adenocarcinoma.  Dr. Shelah was notified  on 10/13/2021.   SEP-OCT 2022-  [Dermatology]-   Poorly differentiated squamous cell carcinoma; -  Carcinoma extends to the edges of the excision specimen   IMPRESSION: 1. The spiculated nodule at the right lung apex on recent neck CT is hypermetabolic and is concerning for primary bronchogenic carcinoma. Tissue sampling recommended. 2. Hypermetabolic activity within the occipital scalp and small previously demonstrated right occipital lymph node compatible with known squamous cell carcinoma. Evaluation of the head and neck limited by motion artifact. 3. Hypermetabolic lesions inferiorly in the right hepatic lobe and in the proximal right femoral diaphysis suspicious for metastatic disease, primary uncertain in this patient with a history of prostate cancer. Correlate with PSA levels. Abdominal MRI without and with contrast may be helpful for further characterization of the liver lesion.  # s/p RT to RUL [[place]]; right hip RT; # BONE METASTASES:  Impending right hip fracture right hip pain-status post radiation [[facility]; DEC 2022]- S/p  Re-RT on 10/03 x5 Fx. NOV 2023-status post intramedullary nail fixation;JUNE 2024 [[place]] Re-surgery-    Awaiting dental clearance for zometa.    # 2007MI-CAD [s/p stent-Dr.Berry; EF 2021- 58%]     Primary  adenocarcinoma of upper lobe of right lung (HCC)  10/18/2021 Initial Diagnosis   Primary adenocarcinoma of upper lobe of right lung (HCC)   10/18/2021 Cancer Staging   Staging form: Lung, AJCC 8th Edition - Clinical: Stage IVB (cT1c, cN0, cM1c) - Signed by Sherrod Sherrod, MD on 10/18/2021   11/01/2021 - 05/30/2022 Chemotherapy   Patient is on Treatment Plan : LUNG NSCLC Pemetrexed  + Carboplatin  q21d x 4 Cycles     11/01/2021 -  Chemotherapy   Patient is on Treatment Plan : LUNG NSCLC Libtayo  q  21d        ALLERGIES:  has no known allergies.  MEDICATIONS:  Current Outpatient Medications  Medication Sig Dispense Refill   albuterol  (PROVENTIL ) (2.5  MG/3ML) 0.083% nebulizer solution Take 3 mLs (2.5 mg total) by nebulization every 6 (six) hours as needed for wheezing or shortness of breath. 75 mL 12   albuterol  (VENTOLIN  HFA) 108 (90 Base) MCG/ACT inhaler Inhale 2 puffs into the lungs every 6 (six) hours as needed for wheezing or shortness of breath. 8 g 6   ALPRAZolam  (XANAX ) 0.25 MG tablet Take 1 tablet (0.25 mg total) by mouth at bedtime. (Patient taking differently: Take 0.25 mg by mouth at bedtime as needed for anxiety.) 15 tablet 0   atorvastatin  (LIPITOR) 20 MG tablet TAKE 1 TABLET BY MOUTH EVERY DAY 90 tablet 3   Budeson-Glycopyrrol-Formoterol  (BREZTRI  AEROSPHERE) 160-9-4.8 MCG/ACT AERO Inhale 2 puffs into the lungs in the morning and at bedtime. 10.7 g 6   clopidogrel  (PLAVIX ) 75 MG tablet TAKE 1 TABLET BY MOUTH EVERY DAY 90 tablet 2   fluticasone  (FLONASE ) 50 MCG/ACT nasal spray Place 1 spray into both nostrils daily as needed for allergies.     ketoconazole (NIZORAL) 2 % cream Apply 1 application  topically 2 (two) times daily as needed for irritation.     methylPREDNISolone  (MEDROL  DOSEPAK) 4 MG TBPK tablet Use as directed. 21 tablet 1   methylPREDNISolone  (MEDROL  DOSEPAK) 4 MG TBPK tablet Use as directed. 21 tablet 0   metoprolol  tartrate (LOPRESSOR ) 25 MG tablet TAKE 1 TABLET (25 MG TOTAL) BY MOUTH DAILY. 90 tablet 2   oxyCODONE  ER (XTAMPZA  ER) 9 MG C12A Take 1 capsule by mouth every 12 (twelve) hours. 60 capsule 0   Oxycodone  HCl 10 MG TABS Take 0.5-1 tablets (5-10 mg total) by mouth every 4 (four) hours as needed. 60 tablet 0   pantoprazole  (PROTONIX ) 40 MG tablet Take 1 tablet (40 mg total) by mouth daily. 60 tablet 1   sennosides-docusate sodium  (SENOKOT-S) 8.6-50 MG tablet Take 1 tablet by mouth daily. 60 tablet 1   sertraline  (ZOLOFT ) 100 MG tablet TAKE 1 TABLET BY MOUTH EVERY DAY 90 tablet 0   tamsulosin  (FLOMAX ) 0.4 MG CAPS capsule Take 1 capsule (0.4 mg total) by mouth daily. 30 capsule 3   Vitamin D , Ergocalciferol ,  (DRISDOL ) 1.25 MG (50000 UNIT) CAPS capsule TAKE 1 CAPSULE BY MOUTH ONE TIME PER WEEK 12 capsule 1   No current facility-administered medications for this visit.    VITAL SIGNS: There were no vitals taken for this visit. There were no vitals filed for this visit.  Estimated body mass index is 34.03 kg/m as calculated from the following:   Height as of 10/10/23: 5' 11 (1.803 m).   Weight as of an earlier encounter on 11/01/23: 244 lb (110.7 kg).  LABS: CBC:    Component Value Date/Time   WBC 6.7 11/01/2023 1332   HGB 14.9 11/01/2023 1332  HGB 14.5 08/27/2023 1331   HCT 44.6 11/01/2023 1332   PLT 221 11/01/2023 1332   PLT 202 08/27/2023 1331   MCV 87.5 11/01/2023 1332   NEUTROABS 4.4 11/01/2023 1332   LYMPHSABS 1.4 11/01/2023 1332   MONOABS 0.8 11/01/2023 1332   EOSABS 0.1 11/01/2023 1332   BASOSABS 0.0 11/01/2023 1332   Comprehensive Metabolic Panel:    Component Value Date/Time   NA 138 11/01/2023 1332   K 3.7 11/01/2023 1332   CL 108 11/01/2023 1332   CO2 21 (L) 11/01/2023 1332   BUN 18 11/01/2023 1332   CREATININE 0.98 11/01/2023 1332   CREATININE 1.07 08/27/2023 1331   GLUCOSE 107 (H) 11/01/2023 1332   CALCIUM  8.8 (L) 11/01/2023 1332   AST 19 11/01/2023 1332   AST 22 08/27/2023 1331   ALT 18 11/01/2023 1332   ALT 20 08/27/2023 1331   ALKPHOS 81 11/01/2023 1332   BILITOT 0.7 11/01/2023 1332   BILITOT 0.6 08/27/2023 1331   PROT 7.0 11/01/2023 1332   ALBUMIN  4.1 11/01/2023 1332    RADIOGRAPHIC STUDIES: No results found.  PERFORMANCE STATUS (ECOG) : 1 - Symptomatic but completely ambulatory  Review of Systems Unless otherwise noted, a complete review of systems is negative.  Physical Exam General: NAD Pulmonary: Unlabored Extremities: no edema, no joint deformities Skin: Scaly, erythematous rash beneath pannus Neurological: Weakness but otherwise nonfocal  IMPRESSION: Follow-up.  Patient reports that overall pain is stable.  He is no longer taking  Xtampza  ER but does take oxycodone  as needed.  Patient recently saw orthopedics due to swollen knee.  X-ray showed possible broken screw.  Patient was suggested to follow-up with his previous orthopedic surgeon - Dr. Kendal.   Symptomatically, patient endorses occasional nocturnal enuresis.  Denies incontinence or urinary problems during the day.  No fever/chills or dysuria.  Discussed possible workup but patient declined.  PLAN: -Continue current scope of treatment -Refill oxycodone  -Follow-up telephone visit 1-2 months   Patient expressed understanding and was in agreement with this plan. He also understands that He can call the clinic at any time with any questions, concerns, or complaints.     Time Total: 15 minutes  Visit consisted of counseling and education dealing with the complex and emotionally intense issues of symptom management and palliative care in the setting of serious and potentially life-threatening illness.Greater than 50%  of this time was spent counseling and coordinating care related to the above assessment and plan.  Signed by: Fonda Mower, PhD, NP-C

## 2023-11-01 NOTE — Progress Notes (Signed)
 [facility] CONSULT NOTE  Patient Care Team: Weyman Bright, MD as PCP - General (Internal Medicine) Court Dorn PARAS, MD as PCP - Cardiology (Cardiology) Sherrod Sherrod, MD as Consulting Physician (Oncology) Weyman Bright, MD as Referring Physician (Internal Medicine) Rennie Cindy SAUNDERS, MD as Consulting Physician (Internal Medicine) Rennie Cindy SAUNDERS, MD as Consulting Physician (Internal Medicine)  CHIEF COMPLAINTS/PURPOSE OF CONSULTATION: lung cancer  #  Oncology History Overview Note  DIAGNOSIS: 1) stage IV (T1c, N0, M1 C) non-small cell lung cancer favoring adenocarcinoma presented with right lung apical nodule in addition to metastatic disease in the right hepatic lobe and the proximal right femoral diaphysis diagnosed and December 2022. 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    QNS; Guardant 360 showed positive KRAS G12C mutation  FINAL MICROSCOPIC DIAGNOSIS:   A. LUNG, RUL, FINE NEEDLE ASPIRATION:  - Malignant cells consistent with non-small cell carcinoma, see comment   B. LUNG, RUL, BRUSHING:  - Malignant cells consistent with non-small cell carcinoma, see comment       COMMENT:   A and B.  Dr. Belvie reviewed the case and concurs with the diagnosis.  Only rare malignant cells are present on the cellblock.  Immunohistochemical stains were attempted and show that the tumor cells  have patchy staining for TTF-1 whereas p63, p40 and CK5/6 are negative.  The findings are nondiagnostic but suggestive of a lung adenocarcinoma.  Dr. Shelah was notified on 10/13/2021.   SEP-OCT 2022-  [Dermatology]-   Poorly differentiated squamous cell carcinoma; -  Carcinoma extends to the edges of the excision specimen   IMPRESSION: 1. The spiculated nodule at the right lung apex on recent neck CT is hypermetabolic and is concerning for primary bronchogenic  carcinoma. Tissue sampling recommended. 2. Hypermetabolic activity within the occipital scalp and small previously demonstrated right occipital lymph node compatible with known squamous cell carcinoma. Evaluation of the head and neck limited by motion artifact. 3. Hypermetabolic lesions inferiorly in the right hepatic lobe and in the proximal right femoral diaphysis suspicious for metastatic disease, primary uncertain in this patient with a history of prostate cancer. Correlate with PSA levels. Abdominal MRI without and with contrast may be helpful for further characterization of the liver lesion.  # s/p RT to RUL [[place]]; right hip RT; # BONE METASTASES:  Impending right hip fracture right hip pain-status post radiation [[facility]; DEC 2022]- S/p  Re-RT on 10/03 x5 Fx. NOV 2023-status post intramedullary nail fixation;JUNE 2024 [[place]] Re-surgery-    Awaiting dental clearance for zometa.    # 2007MI-CAD [s/p stent-Dr.Berry; EF 2021- 58%]     Primary adenocarcinoma of upper lobe of right lung (HCC)  10/18/2021 Initial Diagnosis   Primary adenocarcinoma of upper lobe of right lung (HCC)   10/18/2021 Cancer Staging   Staging form: Lung, AJCC 8th Edition - Clinical: Stage IVB (cT1c, cN0, cM1c) - Signed by Sherrod Sherrod, MD on 10/18/2021   11/01/2021 - 05/30/2022 Chemotherapy   Patient is on Treatment Plan : LUNG NSCLC Pemetrexed  + Carboplatin  q21d x 4 Cycles     11/01/2021 -  Chemotherapy   Patient is on Treatment Plan : LUNG NSCLC Libtayo  q  21d       HISTORY OF PRESENTING ILLNESS: Ambulating with a cane.  Alone.   ABHIRAJ DOZAL 63 y.o. male metastatic stage IV non-small cell lung cancer/favor adenocarcinoma, currently on maintenance libtayo , returns to clinic for follow up and continuation  of treatment. He feels that pain is generally stable. He has been holding libtayo  and taking steroids. Dizziness has mostly resolved. He has been working more and hopes to continue to do so. Denies  worsening cough or shortness of breath. Sleep has improved. Takes a nap in afternoon. Hip pain is stable but he is awaiting evaluation with ortho for possible screw repair.   Review of Systems  Constitutional:  Positive for malaise/fatigue. Negative for chills, diaphoresis, fever and weight loss.  HENT:  Positive for hearing loss. Negative for nosebleeds and sore throat.   Eyes:  Negative for double vision.  Respiratory:  Negative for cough, hemoptysis, sputum production, shortness of breath and wheezing.   Cardiovascular:  Negative for chest pain, palpitations, orthopnea and leg swelling.  Gastrointestinal:  Negative for abdominal pain, blood in stool, diarrhea, heartburn, melena, nausea and vomiting.  Genitourinary:  Negative for dysuria, frequency and urgency.  Musculoskeletal:  Negative for back pain.  Skin: Negative.  Negative for itching and rash.  Neurological:  Negative for dizziness, tingling, focal weakness, weakness and headaches.  Endo/Heme/Allergies:  Does not bruise/bleed easily.  Psychiatric/Behavioral:  Negative for depression. The patient does not have insomnia.      MEDICAL HISTORY:  Past Medical History:  Diagnosis Date   Anxiety    associated with medical care,  worsened by difficulty hearing, does better with wife present   Arthritis    Cancer associated pain    Cancer, metastatic to liver [facility])    Complication of anesthesia    wife states very anxious may need pre sedation   Coronary artery disease    Dyspnea    GERD (gastroesophageal reflux disease)    Hearing loss    mild per wife   Hearing loss    no hearing aids per wife   Hyperlipidemia    Hypertension    MI (myocardial infarction) (HCC) 04/17/2006   Acute inferolateral wall MI with cardiogenic shock and complete heart block   Primary adenocarcinoma of upper lobe of right lung (HCC)    Prostate cancer (HCC)    Sleep apnea    Tobacco abuse    Wears glasses    Wears partial dentures    Upper     SURGICAL HISTORY: Past Surgical History:  Procedure Laterality Date   BRONCHIAL BIOPSY  10/09/2021   Procedure: BRONCHIAL BIOPSIES;  Surgeon: Shelah Lamar RAMAN, MD;  Location: [facility] ENDOSCOPY;  Service: Pulmonary;;   BRONCHIAL BRUSHINGS  10/09/2021   Procedure: BRONCHIAL BRUSHINGS;  Surgeon: Shelah Lamar RAMAN, MD;  Location: [facility] ENDOSCOPY;  Service: Pulmonary;;   BRONCHIAL NEEDLE ASPIRATION BIOPSY  10/09/2021   Procedure: BRONCHIAL NEEDLE ASPIRATION BIOPSIES;  Surgeon: Shelah Lamar RAMAN, MD;  Location: [facility] ENDOSCOPY;  Service: Pulmonary;;   CARDIAC CATHETERIZATION  2007   left, RCA 100% occluded ruptured plaque with thrombus in the proximal segment   CYST EXCISION N/A 08/30/2021   Procedure: EXCISION OF POSTERIOR SCALP CYST;  Surgeon: Signe Mitzie LABOR, MD;  Location: [facility] ORS;  Service: General;  Laterality: N/A;   CYSTOSCOPY N/A 07/17/2018   Procedure: PHYLLIS;  Surgeon: Matilda Senior, MD;  Location[facility];  Service: Urology;  Laterality: N/A;  no seeds in bladder per Dr Matilda   FEMUR IM NAIL Right 04/03/2023   Procedure: REVISION FIXATION OF RIGHT FEMUR NONUNION;  Surgeon: Kendal Franky SQUIBB, MD;  Location: [facility] OR;  Service: Orthopedics;  Laterality: Right;   FIDUCIAL MARKER PLACEMENT  10/09/2021   Procedure: FIDUCIAL MARKER PLACEMENT;  Surgeon:  Shelah Lamar RAMAN, MD;  Location: [facility] ENDOSCOPY;  Service: Pulmonary;;   HAND SURGERY Right    has metal plate in arm   HARDWARE REMOVAL Right 04/03/2023   Procedure: REMOVAL OF PREVIOUS HARDWARE FEMUR;  Surgeon: Kendal Franky SQUIBB, MD;  Location: [facility] OR;  Service: Orthopedics;  Laterality: Right;   PROSTATE BIOPSY     RADIOACTIVE SEED IMPLANT N/A 07/17/2018   Procedure: RADIOACTIVE SEED IMPLANT/BRACHYTHERAPY IMPLANT;  Surgeon: Matilda Senior, MD;  Location: [facility];  Service: Urology;  Laterality: N/A;  77 seeds   SPACE OAR INSTILLATION N/A 07/17/2018   Procedure: SPACE OAR INSTILLATION;  Surgeon: Matilda Senior, MD;  Location[facility];  Service: Urology;  Laterality: N/A;   VIDEO BRONCHOSCOPY WITH RADIAL ENDOBRONCHIAL ULTRASOUND  10/09/2021   Procedure: VIDEO BRONCHOSCOPY WITH RADIAL ENDOBRONCHIAL ULTRASOUND;  Surgeon: Shelah Lamar RAMAN, MD;  Location: [facility] ENDOSCOPY;  Service: Pulmonary;;   WRIST SURGERY  10/04/2012    SOCIAL HISTORY: Social History   Socioeconomic History   Marital status: Married    Spouse name: Not on file   Number of children: Not on file   Years of education: Not on file   Highest education level: Not on file  Occupational History   Not on file  Tobacco Use   Smoking status: Every Day    Current packs/day: 0.75    Average packs/day: 0.8 packs/day for 42.0 years (31.5 ttl pk-yrs)    Types: Cigarettes   Smokeless tobacco: Never  Vaping Use   Vaping status: Never Used  Substance and Sexual Activity   Alcohol  use: No   Drug use: No   Sexual activity: Yes    Birth control/protection: None  Other Topics Concern   Not on file  Social History Narrative   10-04 19 Unable to ask abuse questions wife with him today.   Are you right handed or left handed? Ambidextrous prominent right   Are you currently employed ? 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 no   Who lives with you? Wife and patient   What type of home do you live in: 1 story or 2 story? 1 story       Social Drivers of Corporate Investment Banker Strain: Not on file  Food Insecurity: No Food Insecurity (04/03/2023)   Hunger Vital Sign    Worried About Running Out of Food in the Last Year: Never true    Ran Out of Food in the Last Year: Never true  Transportation Needs: No Transportation Needs (04/03/2023)   PRAPARE - Administrator, Civil Service (Medical): No    Lack of Transportation (Non-Medical): No  Physical Activity: Not on file  Stress: Not on file  Social Connections: Not on file  Intimate Partner Violence: Not  At Risk (04/03/2023)   Humiliation, Afraid, Rape, and Kick questionnaire    Fear of Current or Ex-Partner: No    Emotionally Abused: No    Physically Abused: No    Sexually Abused: No    FAMILY HISTORY: Family History  Problem Relation Age of Onset   Breast cancer Mother    Prostate cancer Neg Hx    Kidney cancer Neg Hx    Cancer Neg Hx     ALLERGIES:  has no known allergies.  MEDICATIONS:  Current Outpatient Medications  Medication Sig Dispense Refill   albuterol  (PROVENTIL ) (2.5 MG/3ML) 0.083% nebulizer solution Take 3 mLs (  2.5 mg total) by nebulization every 6 (six) hours as needed for wheezing or shortness of breath. 75 mL 12   albuterol  (VENTOLIN  HFA) 108 (90 Base) MCG/ACT inhaler Inhale 2 puffs into the lungs every 6 (six) hours as needed for wheezing or shortness of breath. 8 g 6   ALPRAZolam  (XANAX ) 0.25 MG tablet Take 1 tablet (0.25 mg total) by mouth at bedtime. (Patient taking differently: Take 0.25 mg by mouth at bedtime as needed for anxiety.) 15 tablet 0   atorvastatin  (LIPITOR) 20 MG tablet TAKE 1 TABLET BY MOUTH EVERY DAY 90 tablet 3   Budeson-Glycopyrrol-Formoterol  (BREZTRI  AEROSPHERE) 160-9-4.8 MCG/ACT AERO Inhale 2 puffs into the lungs in the morning and at bedtime. 10.7 g 6   clopidogrel  (PLAVIX ) 75 MG tablet TAKE 1 TABLET BY MOUTH EVERY DAY 90 tablet 2   fluticasone  (FLONASE ) 50 MCG/ACT nasal spray Place 1 spray into both nostrils daily as needed for allergies.     ketoconazole (NIZORAL) 2 % cream Apply 1 application  topically 2 (two) times daily as needed for irritation.     metoprolol  tartrate (LOPRESSOR ) 25 MG tablet TAKE 1 TABLET (25 MG TOTAL) BY MOUTH DAILY. 90 tablet 2   oxyCODONE  ER (XTAMPZA  ER) 9 MG C12A Take 1 capsule by mouth every 12 (twelve) hours. 60 capsule 0   Oxycodone  HCl 10 MG TABS Take 0.5-1 tablets (5-10 mg total) by mouth every 4 (four) hours as needed. 60 tablet 0   pantoprazole  (PROTONIX ) 40 MG tablet Take 1 tablet (40 mg total) by mouth  daily. 60 tablet 1   sennosides-docusate sodium  (SENOKOT-S) 8.6-50 MG tablet Take 1 tablet by mouth daily. 60 tablet 1   sertraline  (ZOLOFT ) 100 MG tablet TAKE 1 TABLET BY MOUTH EVERY DAY 90 tablet 0   tamsulosin  (FLOMAX ) 0.4 MG CAPS capsule Take 1 capsule (0.4 mg total) by mouth daily. 30 capsule 3   Vitamin D , Ergocalciferol , (DRISDOL ) 1.25 MG (50000 UNIT) CAPS capsule TAKE 1 CAPSULE BY MOUTH ONE TIME PER WEEK 12 capsule 1   methylPREDNISolone  (MEDROL  DOSEPAK) 4 MG TBPK tablet Use as directed. 21 tablet 1   methylPREDNISolone  (MEDROL  DOSEPAK) 4 MG TBPK tablet Use as directed. 21 tablet 0   No current facility-administered medications for this visit.    PHYSICAL EXAMINATION: ECOG PERFORMANCE STATUS: 1 - Symptomatic but completely ambulatory  Vitals:   11/01/23 1351  BP: 108/64  Pulse: 77  Temp: (!) 97.2 F (36.2 C)  SpO2: 99%     Filed Weights   11/01/23 1351  Weight: 244 lb (110.7 kg)   Physical Exam Vitals reviewed.  Constitutional:      Appearance: He is not ill-appearing.     Comments: Accompanied by wife  HENT:     Head: Normocephalic and atraumatic.     Mouth/Throat:     Pharynx: Oropharynx is clear.  Cardiovascular:     Rate and Rhythm: Normal rate and regular rhythm.  Pulmonary:     Effort: No respiratory distress.     Comments: Decreased breath sounds bilaterally.  Abdominal:     General: There is no distension.     Palpations: Abdomen is soft.     Tenderness: There is no abdominal tenderness.  Skin:    General: Skin is warm.     Coloration: Skin is not pale.  Neurological:     Mental Status: He is alert and oriented to person, place, and time.  Psychiatric:        Mood and Affect: Mood normal.  Behavior: Behavior normal.     LABORATORY DATA:  I have reviewed the data as listed Lab Results  Component Value Date   WBC 6.7 11/01/2023   HGB 14.9 11/01/2023   HCT 44.6 11/01/2023   MCV 87.5 11/01/2023   PLT 221 11/01/2023   Recent Labs     08/07/23 0842 08/27/23 1331 11/01/23 1332  NA 139 139 138  K 3.9 4.1 3.7  CL 106 106 108  CO2 25 24 21*  GLUCOSE 124* 150* 107*  BUN 18 16 18   CREATININE 1.06 1.07 0.98  CALCIUM  9.0 8.8* 8.8*  GFRNONAA >60 >60 >60  PROT 7.3 7.0 7.0  ALBUMIN  4.0 4.1 4.1  AST 17 22 19   ALT 19 20 18   ALKPHOS 83 80 81  BILITOT 0.5 0.6 0.7    RADIOGRAPHIC STUDIES: I have personally reviewed the radiological images as listed and agreed with the findings in the report. No results found.  ASSESSMENT & PLAN:   # Non-small cell lung cancer/stage IV-favor adeno carcinoma-metastases to right femur/liver; synchronous squamous cell carcinoma scalp  S/P- Patient currently on maintanence Libatayo.  CT CAP- 5th AUG, 2024-   Unchanged, treated nodule of the right pulmonary apex. Adjacent post radiation change similar to prior;  Unchanged, tiny treated metastasis of the right lobe of the liver. No new lesions. No evidence of lymphadenopathy or new metastatic disease in the chest, abdomen, or pelvis. NECK CT- NED.    # On Libtayo  maintenance which has been held d/t orthostasis, fatigue. Metoprolol  held. Symptoms improved with steroids. Low suspicion of adrenal insufficiency. AM Cortisol normal. ACTH  normal. Reviewed continuing to hold vs restart libtayo . He feels back to baseline and would like to retry treatment which is reasonable with close follow up. Proceed with libtayo  today and return to clinic in one week for repeat labs, evaluation, +/- fluids and possible steroids.   # BONE METASTASES:  Impending right hip fracture right hip pain- status post radiation [[facility]; DEC 2022]- S/p Re-RT on 10/03 x 5 Fx. NOV 2023- status post intramedullary nail fixation. JUNE 2024 [[place]] Re-surgery. Not on zometa- awaiting dental clearance. Pain currently well controlled- he'll see Josh today with palliative care for follow up.    # Low vit D- [July 2023- vit D- 25]; on  ergocalciferol - Calcium  9.5 improved/stable. FEB vit D-25-OH-  41. Stable.      # Squamous cell carcinoma of the scalp- s/p excision; positive margins- on libtayo . Clinically, no evidence of progressive disease. Potential for radiation if any local progression.    # COPD- recommend follow-up with pulmonary,  [place]- Stable.     # GERD- on prilosec continue PPI BID prior to meals-  Stable.     # IV access: PIV   # DISPOSITION: Treatment today 1 week- lab, NP, +/- fluids & steroids 3 weeks- lab, Dr Rennie, +/- libtayo - la   No problem-specific Assessment & Plan notes found for this encounter.  All questions were answered. The patient knows to call the clinic with any problems, questions or concerns.   Tinnie KANDICE Dawn, NP 11/01/2023

## 2023-11-04 ENCOUNTER — Encounter: Payer: Self-pay | Admitting: Internal Medicine

## 2023-11-07 ENCOUNTER — Telehealth: Payer: Self-pay | Admitting: Internal Medicine

## 2023-11-07 ENCOUNTER — Encounter: Payer: Self-pay | Admitting: Internal Medicine

## 2023-11-07 NOTE — Telephone Encounter (Signed)
 Pt spouse called and seems confused about tomorrow's appts.  Pt spouse also stated that pt isnt feeling well and its cold and seemed to want to cancel the appts but wants to speak with Consuello Masse first.

## 2023-11-08 ENCOUNTER — Ambulatory Visit: Payer: 59 | Admitting: Internal Medicine

## 2023-11-08 ENCOUNTER — Inpatient Hospital Stay: Payer: 59 | Admitting: Nurse Practitioner

## 2023-11-08 ENCOUNTER — Inpatient Hospital Stay: Payer: 59

## 2023-11-08 NOTE — Telephone Encounter (Signed)
 Spoke to patients wife yesterday to advise we like to check counts after the restart of chemo. Mrs. Kolar states her husband was very tired and exhausted but says he worked all day in and out of the cold weather. Advised per Tinnie if patient is not feeling up to coming, ok to to cancel today's appt and see next week if needed. Wife verbalized understanding.

## 2023-11-12 ENCOUNTER — Encounter: Payer: Self-pay | Admitting: Internal Medicine

## 2023-11-15 ENCOUNTER — Other Ambulatory Visit: Payer: Self-pay | Admitting: *Deleted

## 2023-11-15 ENCOUNTER — Telehealth: Payer: Self-pay | Admitting: *Deleted

## 2023-11-15 ENCOUNTER — Encounter: Payer: Self-pay | Admitting: Hospice and Palliative Medicine

## 2023-11-15 ENCOUNTER — Encounter: Payer: Self-pay | Admitting: Internal Medicine

## 2023-11-15 NOTE — Telephone Encounter (Signed)
Oxycodone approved. Pharmacy notified   [facility] (Key: [facility]) [place] Case ID #: [id number]  Outcome Approved today by [employer] NCPDP 2017  Authorization Expiration Date: 11/14/2024 Drug-oxyCODONE HCl 10MG  tablets

## 2023-11-19 ENCOUNTER — Encounter: Payer: Self-pay | Admitting: Internal Medicine

## 2023-11-22 ENCOUNTER — Inpatient Hospital Stay: Payer: 59

## 2023-11-22 ENCOUNTER — Inpatient Hospital Stay (HOSPITAL_BASED_OUTPATIENT_CLINIC_OR_DEPARTMENT_OTHER): Payer: 59 | Admitting: Internal Medicine

## 2023-11-22 ENCOUNTER — Encounter: Payer: Self-pay | Admitting: Internal Medicine

## 2023-11-22 VITALS — BP 128/89 | HR 75 | Temp 97.3°F | Ht 71.0 in | Wt 247.4 lb

## 2023-11-22 DIAGNOSIS — Z5111 Encounter for antineoplastic chemotherapy: Secondary | ICD-10-CM | POA: Diagnosis not present

## 2023-11-22 DIAGNOSIS — C3411 Malignant neoplasm of upper lobe, right bronchus or lung: Secondary | ICD-10-CM

## 2023-11-22 DIAGNOSIS — R519 Headache, unspecified: Secondary | ICD-10-CM

## 2023-11-22 LAB — COMPREHENSIVE METABOLIC PANEL
ALT: 23 U/L (ref 0–44)
AST: 18 U/L (ref 15–41)
Albumin: 4.4 g/dL (ref 3.5–5.0)
Alkaline Phosphatase: 75 U/L (ref 38–126)
Anion gap: 10 (ref 5–15)
BUN: 18 mg/dL (ref 8–23)
CO2: 24 mmol/L (ref 22–32)
Calcium: 9.1 mg/dL (ref 8.9–10.3)
Chloride: 102 mmol/L (ref 98–111)
Creatinine, Ser: 1.09 mg/dL (ref 0.61–1.24)
GFR, Estimated: >60 mL/min (ref >60)
Glucose, Bld: 130 mg/dL — ABNORMAL HIGH (ref 70–99)
Potassium: 3.8 mmol/L (ref 3.5–5.1)
Sodium: 136 mmol/L (ref 135–145)
Total Bilirubin: 0.7 mg/dL (ref 0.0–1.2)
Total Protein: 7.5 g/dL (ref 6.5–8.1)

## 2023-11-22 LAB — CBC WITH DIFFERENTIAL/PLATELET
Abs Immature Granulocytes: 0.02 10*3/uL (ref 0.00–0.07)
Basophils Absolute: 0 10*3/uL (ref 0.0–0.1)
Basophils Relative: 0 %
Eosinophils Absolute: 0.1 10*3/uL (ref 0.0–0.5)
Eosinophils Relative: 3 %
HCT: 47.2 % (ref 39.0–52.0)
Hemoglobin: 15.6 g/dL (ref 13.0–17.0)
Immature Granulocytes: 0 %
Lymphocytes Relative: 20 %
Lymphs Abs: 1.1 10*3/uL (ref 0.7–4.0)
MCH: 28.9 pg (ref 26.0–34.0)
MCHC: 33.1 g/dL (ref 30.0–36.0)
MCV: 87.6 fL (ref 80.0–100.0)
Monocytes Absolute: 0.7 10*3/uL (ref 0.1–1.0)
Monocytes Relative: 12 %
Neutro Abs: 3.7 10*3/uL (ref 1.7–7.7)
Neutrophils Relative %: 65 %
Platelets: 222 10*3/uL (ref 150–400)
RBC: 5.39 MIL/uL (ref 4.22–5.81)
RDW: 14.1 % (ref 11.5–15.5)
WBC: 5.6 10*3/uL (ref 4.0–10.5)
nRBC: 0 % (ref 0.0–0.2)

## 2023-11-22 NOTE — Progress Notes (Signed)
C/O sleeping 16 hours, no energy, sleeping a lot.   C/o confusion of days/short term loss per wife. Can't remember what he eats in a day. Pt states he has to "think on things a minute".   Can the treatments make his bones weak? Could it cause his hip not to heal like it should?

## 2023-11-22 NOTE — Assessment & Plan Note (Addendum)
#   Non-small cell lung cancer/stage IV-favor adeno carcinoma-metastases to right femur/liver; synchronous squamous cell carcinoma scalp  S/P- Patient currently on maintanence Libatayo. NOV 11th, 2024- Unchanged treated nodule of the right pulmonary apex, with adjacent biopsy marking clip and ground-glass radiation fibrosis/pneumonitis; Very tiny subcentimeter treated metastasis of the right lobe of the liver is difficult to clearly appreciate on today's examination although not obviously changed.  No new evidence of metastatic disease in the chest, abdomen, or pelvis.  # CONTINUE to HOLD Single agent Libtayo maintenance [given the ongoing severe fatigue memory issues and overall stability of the disease]- Labs today reviewed;  TSH-WNL- stable.   # Memory loss especially short-term-question etiology-check brain MRI will refer to Dr. Barbaraann Cao for further evaluation.  # Extreme fatigue- Question etiology-? OSA- see below- work up for adrenal insuffiency- [DEC 2024]- cortisol/ACTH- negative.  Await neurology evaluation see below.  Also consider endocrine evaluation.   # BONE METASTASES:  Impending right hip fracture right hip pain-status post radiation [[facility]; DEC 2022]- S/p  Re-RT on 10/03 x5 Fx. NOV 2023-status post intramedullary nail fixation;JUNE 2024 [[place]] Re-surgery-    Awaiting dental clearance for zometa.Not any pain medications.   Stable.     # Low vit D- [July 2023- vit D- 25]; on  ergocalciferol- Calcium 9.5 improved/stable. FEB vit D-25-OH- 41. Stable.     # Squamous cell carcinoma of the scalp-s/p excision; positive margins- on libtayo-no clinical evidence of progression.  Might need to consider radiation-if any local progression noted/also based on course lung cancer. Stable.     # COPD-recommend follow-up with pulmonary, [facility] [place]- Stable.  ? OSA- causing fatigue- defer to pulmonary.   # GERD- on prilosec continue PPI BID prior to meals-  Stable.    # IV access: PIV  #  DISPOSITION: # Brain MRI  # Referral to Dr.Vaslow re: Memory loss. # HOLD Libtayo.  # follow up 4 weeks- MD;labs- cbc/cmp;TSH; posssible  Libtayo;  CT CAP- Dr.B

## 2023-11-22 NOTE — Progress Notes (Signed)
[facility] CONSULT NOTE  Patient Care Team: Sherrie Mustache, MD as PCP - General (Internal Medicine) Runell Gess, MD as PCP - Cardiology (Cardiology) [name], MD as Consulting Physician (Oncology) Sherrie Mustache, MD as Referring Physician (Internal Medicine) Earna Coder, MD as Consulting Physician (Internal Medicine) Earna Coder, MD as Consulting Physician (Internal Medicine)  CHIEF COMPLAINTS/PURPOSE OF CONSULTATION: lung cancer  #  Oncology History Overview Note  DIAGNOSIS: 1) stage IV (T1c, N0, M1 C) non-small cell lung cancer favoring adenocarcinoma presented with right lung apical nodule in addition to metastatic disease in the right hepatic lobe and the proximal right femoral diaphysis diagnosed and December 2022. 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    QNS; Guardant 360 showed positive KRAS G12C mutation  FINAL MICROSCOPIC DIAGNOSIS:   A. LUNG, RUL, FINE NEEDLE ASPIRATION:  - Malignant cells consistent with non-small cell carcinoma, see comment   B. LUNG, RUL, BRUSHING:  - Malignant cells consistent with non-small cell carcinoma, see comment       COMMENT:   A and B.  Dr. Luisa Hart reviewed the case and concurs with the diagnosis.  Only rare malignant cells are present on the cellblock.  Immunohistochemical stains were attempted and show that the tumor cells  have patchy staining for TTF-1 whereas p63, p40 and CK5/6 are negative.  The findings are nondiagnostic but suggestive of a lung adenocarcinoma.  Dr. Delton Coombes was notified on 10/13/2021.   SEP-OCT 2022-  [Dermatology]-   Poorly differentiated squamous cell carcinoma; -  Carcinoma extends to the edges of the excision specimen   IMPRESSION: 1. The spiculated nodule at the right lung apex on recent neck CT is hypermetabolic and is concerning for primary bronchogenic  carcinoma. Tissue sampling recommended. 2. Hypermetabolic activity within the occipital scalp and small previously demonstrated right occipital lymph node compatible with known squamous cell carcinoma. Evaluation of the head and neck limited by motion artifact. 3. Hypermetabolic lesions inferiorly in the right hepatic lobe and in the proximal right femoral diaphysis suspicious for metastatic disease, primary uncertain in this patient with a history of prostate cancer. Correlate with PSA levels. Abdominal MRI without and with contrast may be helpful for further characterization of the liver lesion.  # s/p RT to RUL [[place]]; right hip RT; # BONE METASTASES:  Impending right hip fracture right hip pain-status post radiation [[facility]; DEC 2022]- S/p  Re-RT on 10/03 x5 Fx. NOV 2023-status post intramedullary nail fixation;JUNE 2024 [[place]] Re-surgery-    Awaiting dental clearance for zometa.    # 2007MI-CAD [s/p stent-Dr.Berry; EF 2021- 58%]     Primary adenocarcinoma of upper lobe of right lung (HCC)  10/18/2021 Initial Diagnosis   Primary adenocarcinoma of upper lobe of right lung (HCC)   10/18/2021 Cancer Staging   Staging form: Lung, AJCC 8th Edition - Clinical: Stage IVB (cT1c, cN0, cM1c) - Signed by [name], MD on 10/18/2021   11/01/2021 - 05/30/2022 Chemotherapy   Patient is on Treatment Plan : LUNG NSCLC Pemetrexed + Carboplatin q21d x 4 Cycles     11/01/2021 -  Chemotherapy   Patient is on Treatment Plan : LUNG NSCLC Libtayo q  21d       HISTORY OF PRESENTING ILLNESS: Ambulating with a cane.  Accompanied by his wife.  Chris [name] 63 y.o.  male metastatic stage IV non-small cell lung cancer/favor adenocarcinoma, currently on maintenance libtayo, returns to clinic for follow  up.   Patient complains of onging fatigue. C/O sleeping 16 hours, no energy, sleeping a lot.    As per wife patient having intermittent C/o confusion of days/short term memory loss. Can't remember  what he eats in a day. Pt states he has to "think on things a minute".   Continues to have intermittent pain in his right hip.  Currently awaiting evaluation with orthopedics.  Review of Systems  Constitutional:  Positive for malaise/fatigue. Negative for chills, diaphoresis, fever and weight loss.  HENT:  Positive for hearing loss. Negative for nosebleeds and sore throat.   Eyes:  Negative for double vision.  Respiratory:  Negative for cough, hemoptysis, sputum production, shortness of breath and wheezing.   Cardiovascular:  Negative for chest pain, palpitations, orthopnea and leg swelling.  Gastrointestinal:  Negative for abdominal pain, blood in stool, diarrhea, heartburn, melena, nausea and vomiting.  Genitourinary:  Negative for dysuria, frequency and urgency.  Musculoskeletal:  Negative for back pain.  Skin: Negative.  Negative for itching and rash.  Neurological:  Negative for dizziness, tingling, focal weakness, weakness and headaches.  Endo/Heme/Allergies:  Does not bruise/bleed easily.  Psychiatric/Behavioral:  Negative for depression. The patient does not have insomnia.      MEDICAL HISTORY:  Past Medical History:  Diagnosis Date   Anxiety    associated with medical care,  worsened by difficulty hearing, does better with wife present   Arthritis    Cancer associated pain    Cancer, metastatic to liver [facility])    Complication of anesthesia    wife states very anxious may need pre sedation   Coronary artery disease    Dyspnea    GERD (gastroesophageal reflux disease)    Hearing loss    mild per wife   Hearing loss    no hearing aids per wife   Hyperlipidemia    Hypertension    MI (myocardial infarction) (HCC) 04/17/2006   Acute inferolateral wall MI with cardiogenic shock and complete heart block   Primary adenocarcinoma of upper lobe of right lung (HCC)    Prostate cancer (HCC)    Sleep apnea    Tobacco abuse    Wears glasses    Wears partial dentures    Upper     SURGICAL HISTORY: Past Surgical History:  Procedure Laterality Date   BRONCHIAL BIOPSY  10/09/2021   Procedure: BRONCHIAL BIOPSIES;  Surgeon: Leslye Peer, MD;  Location: [facility] ENDOSCOPY;  Service: Pulmonary;;   BRONCHIAL BRUSHINGS  10/09/2021   Procedure: BRONCHIAL BRUSHINGS;  Surgeon: Leslye Peer, MD;  Location: [facility] ENDOSCOPY;  Service: Pulmonary;;   BRONCHIAL NEEDLE ASPIRATION BIOPSY  10/09/2021   Procedure: BRONCHIAL NEEDLE ASPIRATION BIOPSIES;  Surgeon: Leslye Peer, MD;  Location: [facility] ENDOSCOPY;  Service: Pulmonary;;   CARDIAC CATHETERIZATION  2007   left, RCA 100% occluded ruptured plaque with thrombus in the proximal segment   CYST EXCISION N/A 08/30/2021   Procedure: EXCISION OF POSTERIOR SCALP CYST;  Surgeon: Berna Bue, MD;  Location: [facility] ORS;  Service: General;  Laterality: N/A;   CYSTOSCOPY N/A 07/17/2018   Procedure: Bluford Kaufmann;  Surgeon: Marcine Matar, MD;  Location: [facility];  Service: Urology;  Laterality: N/A;  no seeds in bladder per Dr Retta Diones   FEMUR IM NAIL Right 04/03/2023   Procedure: REVISION FIXATION OF RIGHT FEMUR NONUNION;  Surgeon: Roby Lofts, MD;  Location: [facility] OR;  Service: Orthopedics;  Laterality: Right;   FIDUCIAL MARKER PLACEMENT  10/09/2021   Procedure: FIDUCIAL  MARKER PLACEMENT;  Surgeon: Leslye Peer, MD;  Location: [facility] ENDOSCOPY;  Service: Pulmonary;;   HAND SURGERY Right    has metal plate in arm   HARDWARE REMOVAL Right 04/03/2023   Procedure: REMOVAL OF PREVIOUS HARDWARE FEMUR;  Surgeon: Roby Lofts, MD;  Location: [facility] OR;  Service: Orthopedics;  Laterality: Right;   PROSTATE BIOPSY     RADIOACTIVE SEED IMPLANT N/A 07/17/2018   Procedure: RADIOACTIVE SEED IMPLANT/BRACHYTHERAPY IMPLANT;  Surgeon: Marcine Matar, MD;  Location: [facility];  Service: Urology;  Laterality: N/A;  77 seeds   SPACE OAR INSTILLATION N/A 07/17/2018   Procedure: SPACE OAR INSTILLATION;  Surgeon: Marcine Matar, MD;  Location: [facility];  Service: Urology;  Laterality: N/A;   VIDEO BRONCHOSCOPY WITH RADIAL ENDOBRONCHIAL ULTRASOUND  10/09/2021   Procedure: VIDEO BRONCHOSCOPY WITH RADIAL ENDOBRONCHIAL ULTRASOUND;  Surgeon: Leslye Peer, MD;  Location: [facility] ENDOSCOPY;  Service: Pulmonary;;   WRIST SURGERY  10/04/2012    SOCIAL HISTORY: Social History   Socioeconomic History   Marital status: Married    Spouse name: Not on file   Number of children: Not on file   Years of education: Not on file   Highest education level: Not on file  Occupational History   Not on file  Tobacco Use   Smoking status: Every Day    Current packs/day: 0.75    Average packs/day: 0.8 packs/day for 42.0 years (31.5 ttl pk-yrs)    Types: Cigarettes   Smokeless tobacco: Never  Vaping Use   Vaping status: Never Used  Substance and Sexual Activity   Alcohol use: No   Drug use: No   Sexual activity: Yes    Birth control/protection: None  Other Topics Concern   Not on file  Social History Narrative   10-04 [birth year] Unable to ask abuse questions wife with him today.   Are you right handed or left handed? Ambidextrous prominent right   Are you currently employed ? 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 no   Who lives with you? Wife and patient   What type of home do you live in: 1 story or 2 story? 1 story       Social Drivers of Corporate investment banker Strain: Not on file  Food Insecurity: No Food Insecurity (04/03/2023)   Hunger Vital Sign    Worried About Running Out of Food in the Last Year: Never true    Ran Out of Food in the Last Year: Never true  Transportation Needs: No Transportation Needs (04/03/2023)   PRAPARE - Administrator, Civil Service (Medical): No    Lack of Transportation (Non-Medical): No  Physical Activity: Not on file  Stress: Not on file  Social Connections: Not on file  Intimate Partner Violence: Not  At Risk (04/03/2023)   Humiliation, Afraid, Rape, and Kick questionnaire    Fear of Current or Ex-Partner: No    Emotionally Abused: No    Physically Abused: No    Sexually Abused: No    FAMILY HISTORY: Family History  Problem Relation Age of Onset   Breast cancer Mother    Prostate cancer Neg Hx    Kidney cancer Neg Hx    Cancer Neg Hx     ALLERGIES:  has no known allergies.  MEDICATIONS:  Current Outpatient Medications  Medication Sig Dispense Refill   albuterol (PROVENTIL) (2.5 MG/3ML) 0.083% nebulizer  solution Take 3 mLs (2.5 mg total) by nebulization every 6 (six) hours as needed for wheezing or shortness of breath. 75 mL 12   albuterol (VENTOLIN HFA) 108 (90 Base) MCG/ACT inhaler Inhale 2 puffs into the lungs every 6 (six) hours as needed for wheezing or shortness of breath. 8 g 6   ALPRAZolam (XANAX) 0.25 MG tablet Take 1 tablet (0.25 mg total) by mouth at bedtime. (Patient taking differently: Take 0.25 mg by mouth at bedtime as needed for anxiety.) 15 tablet 0   atorvastatin (LIPITOR) 20 MG tablet TAKE 1 TABLET BY MOUTH EVERY DAY 90 tablet 3   Budeson-Glycopyrrol-Formoterol (BREZTRI AEROSPHERE) 160-9-4.8 MCG/ACT AERO Inhale 2 puffs into the lungs in the morning and at bedtime. 10.7 g 6   clopidogrel (PLAVIX) 75 MG tablet TAKE 1 TABLET BY MOUTH EVERY DAY 90 tablet 2   fluticasone (FLONASE) 50 MCG/ACT nasal spray Place 1 spray into both nostrils daily as needed for allergies.     ketoconazole (NIZORAL) 2 % cream Apply 1 application  topically 2 (two) times daily as needed for irritation.     metoprolol tartrate (LOPRESSOR) 25 MG tablet TAKE 1 TABLET (25 MG TOTAL) BY MOUTH DAILY. 90 tablet 2   Oxycodone HCl 10 MG TABS Take 0.5-1 tablets (5-10 mg total) by mouth every 4 (four) hours as needed. 60 tablet 0   pantoprazole (PROTONIX) 40 MG tablet Take 1 tablet (40 mg total) by mouth daily. 60 tablet 1   sennosides-docusate sodium (SENOKOT-S) 8.6-50 MG tablet Take 1 tablet by mouth  daily. 60 tablet 1   sertraline (ZOLOFT) 100 MG tablet TAKE 1 TABLET BY MOUTH EVERY DAY 90 tablet 0   tamsulosin (FLOMAX) 0.4 MG CAPS capsule Take 1 capsule (0.4 mg total) by mouth daily. 30 capsule 3   Vitamin D, Ergocalciferol, (DRISDOL) 1.25 MG (50000 UNIT) CAPS capsule TAKE 1 CAPSULE BY MOUTH ONE TIME PER WEEK 12 capsule 1   No current facility-administered medications for this visit.    PHYSICAL EXAMINATION: ECOG PERFORMANCE STATUS: 1 - Symptomatic but completely ambulatory  Vitals:   11/22/23 0931  BP: 128/89  Pulse: 75  Temp: (!) 97.3 F (36.3 C)  SpO2: 100%      Filed Weights   11/22/23 0931  Weight: 247 lb 6.4 oz (112.2 kg)    Physical Exam Vitals reviewed.  HENT:     Head: Normocephalic and atraumatic.     Mouth/Throat:     Pharynx: Oropharynx is clear.  Eyes:     Extraocular Movements: Extraocular movements intact.     Pupils: Pupils are equal, round, and reactive to light.  Cardiovascular:     Rate and Rhythm: Normal rate and regular rhythm.  Pulmonary:     Effort: No respiratory distress.     Comments: Decreased breath sounds bilaterally.  Abdominal:     General: There is no distension.     Palpations: Abdomen is soft.  Skin:    General: Skin is warm.     Coloration: Skin is not pale.  Neurological:     Mental Status: He is alert and oriented to person, place, and time.  Psychiatric:        Mood and Affect: Mood normal.        Behavior: Behavior normal.     LABORATORY DATA:  I have reviewed the data as listed Lab Results  Component Value Date   WBC 5.6 11/22/2023   HGB 15.6 11/22/2023   HCT 47.2 11/22/2023   MCV 87.6  11/22/2023   PLT 222 11/22/2023   Recent Labs    08/27/23 1331 11/01/23 1332 11/22/23 0932  NA 139 138 136  K 4.1 3.7 3.8  CL 106 108 102  CO2 24 21* 24  GLUCOSE 150* 107* 130*  BUN 16 18 18   CREATININE 1.07 0.98 1.09  CALCIUM 8.8* 8.8* 9.1  GFRNONAA >60 >60 >60  PROT 7.0 7.0 7.5  ALBUMIN 4.1 4.1 4.4  AST 22 19  18   ALT 20 18 23   ALKPHOS 80 81 75  BILITOT 0.6 0.7 0.7    RADIOGRAPHIC STUDIES: I have personally reviewed the radiological images as listed and agreed with the findings in the report. No results found.  ASSESSMENT & PLAN:    Primary adenocarcinoma of upper lobe of right lung (HCC) # Non-small cell lung cancer/stage IV-favor adeno carcinoma-metastases to right femur/liver; synchronous squamous cell carcinoma scalp  S/P- Patient currently on maintanence Libatayo. NOV 11th, 2024- Unchanged treated nodule of the right pulmonary apex, with adjacent biopsy marking clip and ground-glass radiation fibrosis/pneumonitis; Very tiny subcentimeter treated metastasis of the right lobe of the liver is difficult to clearly appreciate on today's examination although not obviously changed.  No new evidence of metastatic disease in the chest, abdomen, or pelvis.  # CONTINUE to HOLD Single agent Libtayo maintenance [given the ongoing severe fatigue memory issues and overall stability of the disease]- Labs today reviewed;  TSH-WNL- stable.   # Memory loss especially short-term-question etiology-check brain MRI will refer to Dr. Barbaraann Cao for further evaluation.  # Extreme fatigue- Question etiology-? OSA- see below- work up for adrenal insuffiency- [DEC 2024]- cortisol/ACTH- negative.  Await neurology evaluation see below.  Also consider endocrine evaluation.   # BONE METASTASES:  Impending right hip fracture right hip pain-status post radiation [[facility]; DEC 2022]- S/p  Re-RT on 10/03 x5 Fx. NOV 2023-status post intramedullary nail fixation;JUNE 2024 [[place]] Re-surgery-    Awaiting dental clearance for zometa.Not any pain medications.   Stable.     # Low vit D- [July 2023- vit D- 25]; on  ergocalciferol- Calcium 9.5 improved/stable. FEB vit D-25-OH- 41. Stable.     # Squamous cell carcinoma of the scalp-s/p excision; positive margins- on libtayo-no clinical evidence of progression.  Might need to consider  radiation-if any local progression noted/also based on course lung cancer. Stable.     # COPD-recommend follow-up with pulmonary, [facility] [place]- Stable.  ? OSA- causing fatigue- defer to pulmonary.   # GERD- on prilosec continue PPI BID prior to meals-  Stable.    # IV access: PIV  # DISPOSITION: # Brain MRI  # Referral to Dr.Vaslow re: Memory loss. # HOLD Libtayo.  # follow up 4 weeks- MD;labs- cbc/cmp;TSH; posssible  Libtayo;  CT CAP- Dr.B   All questions were answered. The patient knows to call the clinic with any problems, questions or concerns.   Earna Coder, MD 11/22/2023

## 2023-11-23 ENCOUNTER — Ambulatory Visit
Admission: RE | Admit: 2023-11-23 | Discharge: 2023-11-23 | Disposition: A | Discharge status: Home / Self Care | Payer: 59 | Source: Ambulatory Visit | Attending: Internal Medicine | Admitting: Internal Medicine

## 2023-11-23 ENCOUNTER — Encounter: Payer: Self-pay | Admitting: Radiology

## 2023-11-23 DIAGNOSIS — R519 Headache, unspecified: Secondary | ICD-10-CM | POA: Diagnosis present

## 2023-11-23 DIAGNOSIS — C3411 Malignant neoplasm of upper lobe, right bronchus or lung: Secondary | ICD-10-CM | POA: Diagnosis present

## 2023-11-23 MED ORDER — GADOBUTROL 1 MMOL/ML IV SOLN
10.0000 mL | Freq: Once | INTRAVENOUS | Status: AC | PRN
  Administered 2023-11-23: 10 mL via INTRAVENOUS

## 2023-11-25 ENCOUNTER — Encounter: Payer: Self-pay | Admitting: Internal Medicine

## 2023-11-27 ENCOUNTER — Encounter: Payer: Self-pay | Admitting: Internal Medicine

## 2023-11-27 ENCOUNTER — Telehealth: Payer: Self-pay | Admitting: *Deleted

## 2023-11-27 NOTE — Telephone Encounter (Signed)
Wife called and said that the patient had a very bad day yesterday but he seems to be able to be better today she was concerned about waiting for the MRI results and she knows Dr. Donneta Romberg wants the results as soon as possible.  Marcelino Duster calling over to radiology to to see if they can read it ASAP because of the MRI and what is going on with the patient.  Patient was told that we are trying to push it up to get the scan read quickly

## 2023-11-28 ENCOUNTER — Encounter: Payer: Self-pay | Admitting: Internal Medicine

## 2023-12-06 ENCOUNTER — Ambulatory Visit: Admission: RE | Admit: 2023-12-06 | Payer: 59 | Source: Ambulatory Visit

## 2023-12-09 ENCOUNTER — Other Ambulatory Visit: Payer: Self-pay | Admitting: *Deleted

## 2023-12-09 DIAGNOSIS — C3411 Malignant neoplasm of upper lobe, right bronchus or lung: Secondary | ICD-10-CM

## 2023-12-11 ENCOUNTER — Other Ambulatory Visit: Payer: Self-pay | Admitting: Cardiovascular Disease

## 2023-12-13 ENCOUNTER — Encounter: Payer: Self-pay | Admitting: *Deleted

## 2023-12-13 ENCOUNTER — Inpatient Hospital Stay: Admission: RE | Admit: 2023-12-13 | Payer: 59 | Source: Ambulatory Visit

## 2023-12-13 ENCOUNTER — Ambulatory Visit
Admission: RE | Admit: 2023-12-13 | Discharge: 2023-12-13 | Disposition: A | Discharge status: Home / Self Care | Payer: 59 | Source: Ambulatory Visit | Attending: Internal Medicine | Admitting: Internal Medicine

## 2023-12-13 ENCOUNTER — Other Ambulatory Visit: Payer: 59

## 2023-12-13 DIAGNOSIS — C3411 Malignant neoplasm of upper lobe, right bronchus or lung: Secondary | ICD-10-CM

## 2023-12-13 MED ORDER — IOPAMIDOL (ISOVUE-300) INJECTION 61%
500.0000 mL | Freq: Once | INTRAVENOUS | Status: AC | PRN
  Administered 2023-12-13: 100 mL via INTRAVENOUS

## 2023-12-16 ENCOUNTER — Encounter: Payer: Self-pay | Admitting: Internal Medicine

## 2023-12-17 ENCOUNTER — Other Ambulatory Visit: Payer: Self-pay | Admitting: Internal Medicine

## 2023-12-20 ENCOUNTER — Inpatient Hospital Stay: Payer: 59

## 2023-12-20 ENCOUNTER — Inpatient Hospital Stay: Payer: 59 | Admitting: Internal Medicine

## 2023-12-23 ENCOUNTER — Encounter: Payer: Self-pay | Admitting: Pulmonary Disease

## 2023-12-24 ENCOUNTER — Ambulatory Visit: Payer: 59 | Admitting: Pulmonary Disease

## 2023-12-24 ENCOUNTER — Telehealth: Payer: Self-pay | Admitting: Cardiovascular Disease

## 2023-12-24 NOTE — Telephone Encounter (Signed)
  Per MyChart scheduling message:  Initial complaint: Current Meds refills and pains in right chest   Pt c/o of Chest Pain: STAT if active CP, including tightness, pressure, jaw pain, radiating pain to shoulder/upper arm/back, CP unrelieved by Nitro. Symptoms reported of SOB, nausea, vomiting, sweating.  1. Are you having CP right now?    2. Are you experiencing any other symptoms (ex. SOB, nausea, vomiting, sweating)?   3. Is your CP continuous or coming and going?   4. Have you taken Nitroglycerin?   5. How long have you been experiencing CP?    6. If NO CP at time of call then end call with telling Pt to call back or call 911 if Chest pain returns prior to return call from triage team.   No  chest pain at the moment  Some shortness of breathe Only hurts  when cough No nitroglycerin About a week only when he coughs . Knew it was time for recheck for him also  Aundra Millet

## 2023-12-24 NOTE — Telephone Encounter (Signed)
 Patient identification verified by 2 forms. Marilynn Rail, RN    Called and spoke to patient wife Fay Records states:   -patient reports chest pain when coughing   -has stabbing pain on right side of chest   -has to hold chest when he is coughing   -he has SOB   -currently fighting lung cancer   -chest pain with cough on going for a few weeks   -patient had OV with oncology 2/28  -unsure if related to cardiac   -patient is due for annual follow up [name] denies:   -fever/chills   -cold/flu  Patient scheduled for Annual follow up 3/24 with Dr. Allyson Sabal  Informed message sent to Dr. Allyson Sabal for input/advisement  [name] has no further questions at this time

## 2023-12-24 NOTE — Telephone Encounter (Signed)
 Patient states they cannot get through on phones. Please contact to schedule. Thank you!

## 2023-12-25 ENCOUNTER — Encounter: Payer: Self-pay | Admitting: Internal Medicine

## 2023-12-25 NOTE — Telephone Encounter (Signed)
 Runell Gess, MD  You1 minute ago (2:21 PM)    Does not sound cardiac. Needs follow up with PCP   Patient identification verified by 2 forms. Marilynn Rail, RN    Called and spoke to patients wife Ned Card provider message  Patient verbalized understanding, no questions at this time

## 2023-12-26 ENCOUNTER — Ambulatory Visit: Payer: 59 | Admitting: Internal Medicine

## 2023-12-27 ENCOUNTER — Inpatient Hospital Stay (HOSPITAL_BASED_OUTPATIENT_CLINIC_OR_DEPARTMENT_OTHER): Payer: 59 | Admitting: Internal Medicine

## 2023-12-27 ENCOUNTER — Encounter: Payer: Self-pay | Admitting: Internal Medicine

## 2023-12-27 ENCOUNTER — Inpatient Hospital Stay: Payer: 59 | Attending: Internal Medicine

## 2023-12-27 ENCOUNTER — Inpatient Hospital Stay: Payer: 59

## 2023-12-27 VITALS — BP 139/81 | HR 77 | Temp 98.0°F | Ht 71.0 in | Wt 250.2 lb

## 2023-12-27 DIAGNOSIS — C7951 Secondary malignant neoplasm of bone: Secondary | ICD-10-CM | POA: Insufficient documentation

## 2023-12-27 DIAGNOSIS — Z8546 Personal history of malignant neoplasm of prostate: Secondary | ICD-10-CM | POA: Insufficient documentation

## 2023-12-27 DIAGNOSIS — C3411 Malignant neoplasm of upper lobe, right bronchus or lung: Secondary | ICD-10-CM

## 2023-12-27 DIAGNOSIS — Z85828 Personal history of other malignant neoplasm of skin: Secondary | ICD-10-CM | POA: Diagnosis not present

## 2023-12-27 DIAGNOSIS — F17210 Nicotine dependence, cigarettes, uncomplicated: Secondary | ICD-10-CM | POA: Insufficient documentation

## 2023-12-27 DIAGNOSIS — Z79899 Other long term (current) drug therapy: Secondary | ICD-10-CM | POA: Insufficient documentation

## 2023-12-27 DIAGNOSIS — C787 Secondary malignant neoplasm of liver and intrahepatic bile duct: Secondary | ICD-10-CM | POA: Diagnosis present

## 2023-12-27 LAB — COMPREHENSIVE METABOLIC PANEL
ALT: 23 U/L (ref 0–44)
AST: 19 U/L (ref 15–41)
Albumin: 4.2 g/dL (ref 3.5–5.0)
Alkaline Phosphatase: 75 U/L (ref 38–126)
Anion gap: 10 (ref 5–15)
BUN: 20 mg/dL (ref 8–23)
CO2: 23 mmol/L (ref 22–32)
Calcium: 9 mg/dL (ref 8.9–10.3)
Chloride: 105 mmol/L (ref 98–111)
Creatinine, Ser: 0.97 mg/dL (ref 0.61–1.24)
GFR, Estimated: >60 mL/min (ref >60)
Glucose, Bld: 105 mg/dL — ABNORMAL HIGH (ref 70–99)
Potassium: 4 mmol/L (ref 3.5–5.1)
Sodium: 138 mmol/L (ref 135–145)
Total Bilirubin: 0.6 mg/dL (ref 0.0–1.2)
Total Protein: 7.6 g/dL (ref 6.5–8.1)

## 2023-12-27 LAB — CBC WITH DIFFERENTIAL/PLATELET
Abs Immature Granulocytes: 0.04 10*3/uL (ref 0.00–0.07)
Basophils Absolute: 0 10*3/uL (ref 0.0–0.1)
Basophils Relative: 1 %
Eosinophils Absolute: 0.1 10*3/uL (ref 0.0–0.5)
Eosinophils Relative: 2 %
HCT: 46.1 % (ref 39.0–52.0)
Hemoglobin: 15.5 g/dL (ref 13.0–17.0)
Immature Granulocytes: 1 %
Lymphocytes Relative: 18 %
Lymphs Abs: 1.1 10*3/uL (ref 0.7–4.0)
MCH: 29.6 pg (ref 26.0–34.0)
MCHC: 33.6 g/dL (ref 30.0–36.0)
MCV: 88.1 fL (ref 80.0–100.0)
Monocytes Absolute: 0.9 10*3/uL (ref 0.1–1.0)
Monocytes Relative: 14 %
Neutro Abs: 4.1 10*3/uL (ref 1.7–7.7)
Neutrophils Relative %: 64 %
Platelets: 213 10*3/uL (ref 150–400)
RBC: 5.23 MIL/uL (ref 4.22–5.81)
RDW: 14.2 % (ref 11.5–15.5)
WBC: 6.3 10*3/uL (ref 4.0–10.5)
nRBC: 0 % (ref 0.0–0.2)

## 2023-12-27 LAB — TSH: TSH: 2.839 u[IU]/mL (ref 0.350–4.500)

## 2023-12-27 MED ORDER — MELOXICAM 7.5 MG PO TABS: 7.5000 mg | ORAL_TABLET | Freq: Every day | ORAL | 0 refills | Status: DC

## 2023-12-27 NOTE — Assessment & Plan Note (Addendum)
#   Non-small cell lung cancer/stage IV-favor adeno carcinoma-metastases to right femur/liver; synchronous squamous cell carcinoma scalp  FEB 14th, 2025-  Stable post treatment changes at the RIGHT lung apex. No evidence of thoracic metastasis; No evidence of metastatic disease in the abdomen pelvis. No change in adrenal hyperplasia/adrenal adenoma.   # CONTINUE to HOLD Single agent Libtayo maintenance [given the ongoing severe fatigue memory issues and overall stability of the disease]- Labs today reviewed;  TSH-WNL- stable.   # Memory loss especially short-term-question etiology- JAN 2025- MRI brain negative for any metastases.  Patient currently awaiting evaluation with Dr. Barbaraann Cao next week.  # right chest wall pain- pleuritic pain- no tachycardia- no concerns for PE. Recommend Meloxicam  for 7-10 days.    # BONE METASTASES: Hx Impending right hip fracture right hip pain-status post radiation [[facility]; DEC 2022]- S/p  Re-RT on 10/03 x5 Fx. NOV 2023-status post intramedullary nail fixation;JUNE 2024 [[place]] Discussed with Dr.Haddix office. HOLD off zometa as per discussion with Ortho.   # Low vit D- [July 2023- vit D- 25]; on  ergocalciferol- Calcium 9.5 improved/stable. FEB vit D-25-OH- 41. Stable.     # Squamous cell carcinoma of the scalp-s/p excision; positive margins- on libtayo-no clinical evidence of progression.  Might need to consider radiation-if any local progression noted/also based on course lung cancer. Stable.     # COPD-recommend follow-up with pulmonary, [facility] [place]- Stable.  ? OSA- causing fatigue- defer to pulmonary- Stable.     # GERD- on prilosec continue PPI BID prior to meals-  Stable.   # IV access: PIV  #Incidental findings on Imaging  CT , 2024: Adrenal hyperplasia/adenoma; atherosclerosis; prostate seed implants I reviewed/discussed/counseled the patient.   # DISPOSITION: # add d-dimer to labs  # HOLD Libtayo.  # follow up 2 months- MD;labs- cbc/cmp;TSH-  Dr.B   #  I reviewed the blood work- with the patient in detail; also reviewed the imaging independently [as summarized above]; and with the patient in detail.

## 2023-12-27 NOTE — Progress Notes (Signed)
 [facility] CONSULT NOTE  Patient Care Team: Sherrie Mustache, MD as PCP - General (Internal Medicine) Runell Gess, MD as PCP - Cardiology (Cardiology) [name], MD as Consulting Physician (Oncology) Sherrie Mustache, MD as Referring Physician (Internal Medicine) Chris Coder, MD as Consulting Physician (Internal Medicine) Chris Coder, MD as Consulting Physician (Internal Medicine)  CHIEF COMPLAINTS/PURPOSE OF CONSULTATION: lung cancer  #  Oncology History Overview Note  DIAGNOSIS: 1) stage IV (T1c, N0, M1 C) non-small cell lung cancer favoring adenocarcinoma presented with right lung apical nodule in addition to metastatic disease in the right hepatic lobe and the proximal right femoral diaphysis diagnosed and December 2022. 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    QNS; Guardant 360 showed positive KRAS G12C mutation  FINAL MICROSCOPIC DIAGNOSIS:   A. LUNG, RUL, FINE NEEDLE ASPIRATION:  - Malignant cells consistent with non-small cell carcinoma, see comment   B. LUNG, RUL, BRUSHING:  - Malignant cells consistent with non-small cell carcinoma, see comment       COMMENT:   A and B.  Dr. Luisa Hart reviewed the case and concurs with the diagnosis.  Only rare malignant cells are present on the cellblock.  Immunohistochemical stains were attempted and show that the tumor cells  have patchy staining for TTF-1 whereas p63, p40 and CK5/6 are negative.  The findings are nondiagnostic but suggestive of a lung adenocarcinoma.  Dr. Delton Coombes was notified on 10/13/2021.   SEP-OCT 2022-  [Dermatology]-   Poorly differentiated squamous cell carcinoma; -  Carcinoma extends to the edges of the excision specimen   IMPRESSION: 1. The spiculated nodule at the right lung apex on recent neck CT is hypermetabolic and is concerning for primary bronchogenic  carcinoma. Tissue sampling recommended. 2. Hypermetabolic activity within the occipital scalp and small previously demonstrated right occipital lymph node compatible with known squamous cell carcinoma. Evaluation of the head and neck limited by motion artifact. 3. Hypermetabolic lesions inferiorly in the right hepatic lobe and in the proximal right femoral diaphysis suspicious for metastatic disease, primary uncertain in this patient with a history of prostate cancer. Correlate with PSA levels. Abdominal MRI without and with contrast may be helpful for further characterization of the liver lesion.  # s/p RT to RUL [[place]]; right hip RT; # BONE METASTASES:  Impending right hip fracture right hip pain-status post radiation [[facility]; DEC 2022]- S/p  Re-RT on 10/03 x5 Fx. NOV 2023-status post intramedullary nail fixation;JUNE 2024 [[place]] Re-surgery-    Awaiting dental clearance for zometa.    # 2007MI-CAD [s/p stent-Dr.Berry; EF 2021- 58%]     Primary adenocarcinoma of upper lobe of right lung (HCC)  10/18/2021 Initial Diagnosis   Primary adenocarcinoma of upper lobe of right lung (HCC)   10/18/2021 Cancer Staging   Staging form: Lung, AJCC 8th Edition - Clinical: Stage IVB (cT1c, cN0, cM1c) - Signed by [name], MD on 10/18/2021   11/01/2021 - 05/30/2022 Chemotherapy   Patient is on Treatment Plan : LUNG NSCLC Pemetrexed + Carboplatin q21d x 4 Cycles     11/01/2021 -  Chemotherapy   Patient is on Treatment Plan : LUNG NSCLC Libtayo q  21d       HISTORY OF PRESENTING ILLNESS: Ambulating with a cane.  Accompanied by his wife.  Chris [name] 63 y.o.  male metastatic stage IV non-small cell lung cancer/favor adenocarcinoma, currently on maintenance libtayo, returns to clinic for follow  up/a dn review the results of the CT scan.  Patient complains of ongoing mild cough.  Noted to have pleuritic chest pain over the last 2 weeks or so.  No worsening shortness of.  No  hemoptysis.  Patient currently s/p evaluation with orthopedics for his right femur not healing, non weight bearing.   Patient complains of onging fatigue.  As per wife patient having intermittent C/o confusion of days/short term memory loss.    Review of Systems  Constitutional:  Positive for malaise/fatigue. Negative for chills, diaphoresis, fever and weight loss.  HENT:  Positive for hearing loss. Negative for nosebleeds and sore throat.   Eyes:  Negative for double vision.  Respiratory:  Negative for cough, hemoptysis, sputum production, shortness of breath and wheezing.   Cardiovascular:  Negative for chest pain, palpitations, orthopnea and leg swelling.  Gastrointestinal:  Negative for abdominal pain, blood in stool, diarrhea, heartburn, melena, nausea and vomiting.  Genitourinary:  Negative for dysuria, frequency and urgency.  Musculoskeletal:  Positive for back pain and joint pain.  Skin: Negative.  Negative for itching and rash.  Neurological:  Negative for dizziness, tingling, focal weakness, weakness and headaches.  Endo/Heme/Allergies:  Does not bruise/bleed easily.  Psychiatric/Behavioral:  Negative for depression. The patient does not have insomnia.      MEDICAL HISTORY:  Past Medical History:  Diagnosis Date   Anxiety    associated with medical care,  worsened by difficulty hearing, does better with wife present   Arthritis    Cancer associated pain    Cancer, metastatic to liver [facility])    Complication of anesthesia    wife states very anxious may need pre sedation   Coronary artery disease    Dyspnea    GERD (gastroesophageal reflux disease)    Hearing loss    mild per wife   Hearing loss    no hearing aids per wife   Hyperlipidemia    Hypertension    MI (myocardial infarction) (HCC) 04/17/2006   Acute inferolateral wall MI with cardiogenic shock and complete heart block   Primary adenocarcinoma of upper lobe of right lung (HCC)    Prostate cancer (HCC)     Sleep apnea    Tobacco abuse    Wears glasses    Wears partial dentures    Upper    SURGICAL HISTORY: Past Surgical History:  Procedure Laterality Date   BRONCHIAL BIOPSY  10/09/2021   Procedure: BRONCHIAL BIOPSIES;  Surgeon: Leslye Peer, MD;  Location: [facility] ENDOSCOPY;  Service: Pulmonary;;   BRONCHIAL BRUSHINGS  10/09/2021   Procedure: BRONCHIAL BRUSHINGS;  Surgeon: Leslye Peer, MD;  Location: [facility] ENDOSCOPY;  Service: Pulmonary;;   BRONCHIAL NEEDLE ASPIRATION BIOPSY  10/09/2021   Procedure: BRONCHIAL NEEDLE ASPIRATION BIOPSIES;  Surgeon: Leslye Peer, MD;  Location: [facility] ENDOSCOPY;  Service: Pulmonary;;   CARDIAC CATHETERIZATION  2007   left, RCA 100% occluded ruptured plaque with thrombus in the proximal segment   CYST EXCISION N/A 08/30/2021   Procedure: EXCISION OF POSTERIOR SCALP CYST;  Surgeon: Berna Bue, MD;  Location: [facility] ORS;  Service: General;  Laterality: N/A;   CYSTOSCOPY N/A 07/17/2018   Procedure: Bluford Kaufmann;  Surgeon: Marcine Matar, MD;  Location: [facility];  Service: Urology;  Laterality: N/A;  no seeds in bladder per Dr Retta Diones   FEMUR IM NAIL Right 04/03/2023   Procedure: REVISION FIXATION OF RIGHT FEMUR NONUNION;  Surgeon: Roby Lofts, MD;  Location: [facility] OR;  Service: Orthopedics;  Laterality:  Right;   FIDUCIAL MARKER PLACEMENT  10/09/2021   Procedure: FIDUCIAL MARKER PLACEMENT;  Surgeon: Leslye Peer, MD;  Location[facility] ENDOSCOPY;  Service: Pulmonary;;   HAND SURGERY Right    has metal plate in arm   HARDWARE REMOVAL Right 04/03/2023   Procedure: REMOVAL OF PREVIOUS HARDWARE FEMUR;  Surgeon: Roby Lofts, MD;  Location: [facility] OR;  Service: Orthopedics;  Laterality: Right;   PROSTATE BIOPSY     RADIOACTIVE SEED IMPLANT N/A 07/17/2018   Procedure: RADIOACTIVE SEED IMPLANT/BRACHYTHERAPY IMPLANT;  Surgeon: Marcine Matar, MD;  Location: [facility];  Service: Urology;  Laterality: N/A;  77 seeds   SPACE OAR  INSTILLATION N/A 07/17/2018   Procedure: SPACE OAR INSTILLATION;  Surgeon: Marcine Matar, MD;  Location: [facility];  Service: Urology;  Laterality: N/A;   VIDEO BRONCHOSCOPY WITH RADIAL ENDOBRONCHIAL ULTRASOUND  10/09/2021   Procedure: VIDEO BRONCHOSCOPY WITH RADIAL ENDOBRONCHIAL ULTRASOUND;  Surgeon: Leslye Peer, MD;  Location: [facility] ENDOSCOPY;  Service: Pulmonary;;   WRIST SURGERY  10/04/2012    SOCIAL HISTORY: Social History   Socioeconomic History   Marital status: Married    Spouse name: Not on file   Number of children: Not on file   Years of education: Not on file   Highest education level: Not on file  Occupational History   Not on file  Tobacco Use   Smoking status: Every Day    Current packs/day: 0.75    Average packs/day: 0.8 packs/day for 42.0 years (31.5 ttl pk-yrs)    Types: Cigarettes   Smokeless tobacco: Never  Vaping Use   Vaping status: Never Used  Substance and Sexual Activity   Alcohol use: No   Drug use: No   Sexual activity: Yes    Birth control/protection: None  Other Topics Concern   Not on file  Social History Narrative   10-04 19 Unable to ask abuse questions wife with him today.   Are you right handed or left handed? Ambidextrous prominent right   Are you currently employed ? 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 no   Who lives with you? Wife and patient   What type of home do you live in: 1 story or 2 story? 1 story       Social Drivers of Corporate investment banker Strain: Not on file  Food Insecurity: No Food Insecurity (04/03/2023)   Hunger Vital Sign    Worried About Running Out of Food in the Last Year: Never true    Ran Out of Food in the Last Year: Never true  Transportation Needs: No Transportation Needs (04/03/2023)   PRAPARE - Administrator, Civil Service (Medical): No    Lack of Transportation (Non-Medical): No  Physical Activity: Not on file   Stress: Not on file  Social Connections: Not on file  Intimate Partner Violence: Not At Risk (04/03/2023)   Humiliation, Afraid, Rape, and Kick questionnaire    Fear of Current or Ex-Partner: No    Emotionally Abused: No    Physically Abused: No    Sexually Abused: No    FAMILY HISTORY: Family History  Problem Relation Age of Onset   Breast cancer Mother    Prostate cancer Neg Hx    Kidney cancer Neg Hx    Cancer Neg Hx     ALLERGIES:  has no known allergies.  MEDICATIONS:  Current Outpatient Medications  Medication Sig Dispense Refill   albuterol (PROVENTIL) (2.5 MG/3ML) 0.083% nebulizer solution Take 3 mLs (2.5 mg total) by nebulization every 6 (six) hours as needed for wheezing or shortness of breath. 75 mL 12   albuterol (VENTOLIN HFA) 108 (90 Base) MCG/ACT inhaler Inhale 2 puffs into the lungs every 6 (six) hours as needed for wheezing or shortness of breath. 8 g 6   ALPRAZolam (XANAX) 0.25 MG tablet Take 1 tablet (0.25 mg total) by mouth at bedtime. (Patient taking differently: Take 0.25 mg by mouth at bedtime as needed for anxiety.) 15 tablet 0   atorvastatin (LIPITOR) 20 MG tablet TAKE 1 TABLET BY MOUTH EVERY DAY 90 tablet 3   Budeson-Glycopyrrol-Formoterol (BREZTRI AEROSPHERE) 160-9-4.8 MCG/ACT AERO Inhale 2 puffs into the lungs in the morning and at bedtime. 10.7 g 6   clopidogrel (PLAVIX) 75 MG tablet TAKE 1 TABLET BY MOUTH EVERY DAY 90 tablet 2   fluticasone (FLONASE) 50 MCG/ACT nasal spray Place 1 spray into both nostrils daily as needed for allergies.     ketoconazole (NIZORAL) 2 % cream Apply 1 application  topically 2 (two) times daily as needed for irritation.     meloxicam (MOBIC) 7.5 MG tablet Take 1 tablet (7.5 mg total) by mouth daily. 14 tablet 0   metoprolol tartrate (LOPRESSOR) 25 MG tablet TAKE 1 TABLET (25 MG TOTAL) BY MOUTH DAILY. 30 tablet 0   Oxycodone HCl 10 MG TABS Take 0.5-1 tablets (5-10 mg total) by mouth every 4 (four) hours as needed. 60 tablet 0    pantoprazole (PROTONIX) 40 MG tablet Take 1 tablet (40 mg total) by mouth daily. 60 tablet 1   sennosides-docusate sodium (SENOKOT-S) 8.6-50 MG tablet Take 1 tablet by mouth daily. 60 tablet 1   sertraline (ZOLOFT) 100 MG tablet TAKE 1 TABLET BY MOUTH EVERY DAY 90 tablet 0   tamsulosin (FLOMAX) 0.4 MG CAPS capsule Take 1 capsule (0.4 mg total) by mouth daily. 30 capsule 3   Vitamin D, Ergocalciferol, (DRISDOL) 1.25 MG (50000 UNIT) CAPS capsule TAKE 1 CAPSULE BY MOUTH ONE TIME PER WEEK 12 capsule 1   No current facility-administered medications for this visit.    PHYSICAL EXAMINATION: ECOG PERFORMANCE STATUS: 1 - Symptomatic but completely ambulatory  Vitals:   12/27/23 0830  BP: 139/81  Pulse: 77  Temp: 98 F (36.7 C)  SpO2: 100%      Filed Weights   12/27/23 0830  Weight: 250 lb 3.2 oz (113.5 kg)    Physical Exam Vitals reviewed.  HENT:     Head: Normocephalic and atraumatic.     Mouth/Throat:     Pharynx: Oropharynx is clear.  Eyes:     Extraocular Movements: Extraocular movements intact.     Pupils: Pupils are equal, round, and reactive to light.  Cardiovascular:     Rate and Rhythm: Normal rate and regular rhythm.  Pulmonary:     Effort: No respiratory distress.     Comments: Decreased breath sounds bilaterally.  Abdominal:     General: There is no distension.     Palpations: Abdomen is soft.  Skin:    General: Skin is warm.     Coloration: Skin is not pale.  Neurological:     Mental Status: He is alert and oriented to person, place, and time.  Psychiatric:        Mood and Affect: Mood normal.        Behavior: Behavior normal.     LABORATORY DATA:  I have  reviewed the data as listed Lab Results  Component Value Date   WBC 6.3 12/27/2023   HGB 15.5 12/27/2023   HCT 46.1 12/27/2023   MCV 88.1 12/27/2023   PLT 213 12/27/2023   Recent Labs    11/01/23 1332 11/22/23 0932 12/27/23 0824  NA 138 136 138  K 3.7 3.8 4.0  CL 108 102 105  CO2 21*  24 23  GLUCOSE 107* 130* 105*  BUN 18 18 20   CREATININE 0.98 1.09 0.97  CALCIUM 8.8* 9.1 9.0  GFRNONAA >60 >60 >60  PROT 7.0 7.5 7.6  ALBUMIN 4.1 4.4 4.2  AST 19 18 19   ALT 18 23 23   ALKPHOS 81 75 75  BILITOT 0.7 0.7 0.6    RADIOGRAPHIC STUDIES: I have personally reviewed the radiological images as listed and agreed with the findings in the report. CT CHEST ABDOMEN PELVIS W CONTRAST Result Date: 12/20/2023 CLINICAL DATA:  Non-small cell lung cancer. Additional history of prostate cancer. * Tracking Code: BO * EXAM: CT CHEST, ABDOMEN, AND PELVIS WITH CONTRAST TECHNIQUE: Multidetector CT imaging of the chest, abdomen and pelvis was performed following the standard protocol during bolus administration of intravenous contrast. RADIATION DOSE REDUCTION: This exam was performed according to the departmental dose-optimization program which includes automated exposure control, adjustment of the mA and/or kV according to patient size and/or use of iterative reconstruction technique. CONTRAST:  ISOVUE-300 IOPAMIDOL (ISOVUE-300) INJECTION 61% COMPARISON:  None Available. FINDINGS: CT CHEST FINDINGS Cardiovascular: No significant vascular findings. Normal heart size. No pericardial effusion. Mediastinum/Nodes: No axillary or supraclavicular adenopathy. No mediastinal or hilar adenopathy. No pericardial fluid. Esophagus normal. Lungs/Pleura: Pleuroparenchymal nodular thickening at the RIGHT lung apex is unchanged. The nodular portion measures 13 mm x 7 mm (image 14/series 3 compared to 15 mm x 8 mm. Visually nodule is unchanged. No new pulmonary nodules are present. There is mild atelectasis in the lingula which is also unchanged. Musculoskeletal: No aggressive osseous lesion. CT ABDOMEN AND PELVIS FINDINGS Hepatobiliary: No focal hepatic lesion. No biliary ductal dilatation. Gallbladder is normal. Common bile duct is normal. Pancreas: Pancreas is normal. No ductal dilatation. No pancreatic inflammation.  Spleen: Normal spleen Adrenals/urinary tract: Bilateral thickening of the adrenal glands not changed from comparison exam. No specific follow-up recommended. Nonobstructing calculus in LEFT kidney. Ureters and bladder normal. Stomach/Bowel: Stomach, small bowel, appendix, and cecum are normal. The colon and rectosigmoid colon are normal. Vascular/Lymphatic: Abdominal aorta is normal caliber with atherosclerotic calcification. There is no retroperitoneal or periportal lymphadenopathy. No pelvic lymphadenopathy. Reproductive: Brachytherapy seeds in the prostate gland. No evidence pelvic metastasis. Other: No free fluid. Musculoskeletal: No aggressive osseous lesion. IMPRESSION: CHEST: 1. Stable post treatment changes at the RIGHT lung apex. 2. No evidence of thoracic metastasis. PELVIS: 1. No evidence of metastatic disease in the abdomen pelvis. 2. No change in adrenal hyperplasia/adrenal adenoma. No specific follow-up recommended. 3. Brachytherapy seeds in the prostate gland. 4.  Aortic Atherosclerosis (ICD10-I70.0). Electronically Signed   By: Genevive Bi M.D.   On: 12/20/2023 12:53    ASSESSMENT & PLAN:    Primary adenocarcinoma of upper lobe of right lung (HCC) # Non-small cell lung cancer/stage IV-favor adeno carcinoma-metastases to right femur/liver; synchronous squamous cell carcinoma scalp  FEB 14th, 2025-  Stable post treatment changes at the RIGHT lung apex. No evidence of thoracic metastasis; No evidence of metastatic disease in the abdomen pelvis. No change in adrenal hyperplasia/adrenal adenoma.   # CONTINUE to HOLD Single agent Libtayo maintenance [given the ongoing  severe fatigue memory issues and overall stability of the disease]- Labs today reviewed;  TSH-WNL- stable.   # Memory loss especially short-term-question etiology- JAN 2025- MRI brain negative for any metastases.  Patient currently awaiting evaluation with Dr. Barbaraann Cao next week.  # right chest wall pain- pleuritic pain- no  tachycardia- no concerns for PE. Recommend Meloxicam  for 7-10 days.    # BONE METASTASES: Hx Impending right hip fracture right hip pain-status post radiation [[facility]; DEC 2022]- S/p  Re-RT on 10/03 x5 Fx. NOV 2023-status post intramedullary nail fixation;JUNE 2024 [[place]] Discussed with Dr.Haddix office. HOLD off zometa as per discussion with Ortho.   # Low vit D- [July 2023- vit D- 25]; on  ergocalciferol- Calcium 9.5 improved/stable. FEB vit D-25-OH- 41. Stable.     # Squamous cell carcinoma of the scalp-s/p excision; positive margins- on libtayo-no clinical evidence of progression.  Might need to consider radiation-if any local progression noted/also based on course lung cancer. Stable.     # COPD-recommend follow-up with pulmonary, [facility] [place]- Stable.  ? OSA- causing fatigue- defer to pulmonary- Stable.     # GERD- on prilosec continue PPI BID prior to meals-  Stable.   # IV access: PIV  #Incidental findings on Imaging  CT , 2024: Adrenal hyperplasia/adenoma; atherosclerosis; prostate seed implants I reviewed/discussed/counseled the patient.   # DISPOSITION: # add d-dimer to labs  # HOLD Libtayo.  # follow up 2 months- MD;labs- cbc/cmp;TSH-  Dr.B   # I reviewed the blood work- with the patient in detail; also reviewed the imaging independently [as summarized above]; and with the patient in detail.      All questions were answered. The patient knows to call the clinic with any problems, questions or concerns.   Chris Coder, MD 12/27/2023

## 2023-12-27 NOTE — Progress Notes (Signed)
 Wife states she called and spoke with the on call doctor Sunday for cough and chest pain, not sure who she spoke with, the doctor was to speak with you about this.  Only note I see is from cardiology.  Chest xray done at trauma center in [place]. Concerned with the thickening on scan.  Right femur not healing, non weight bearing.

## 2023-12-30 ENCOUNTER — Inpatient Hospital Stay: Payer: 59 | Attending: Internal Medicine | Admitting: Hospice and Palliative Medicine

## 2023-12-30 DIAGNOSIS — C3411 Malignant neoplasm of upper lobe, right bronchus or lung: Secondary | ICD-10-CM

## 2023-12-30 NOTE — Progress Notes (Signed)
 Will reschedule per request of patient's wife.

## 2023-12-31 ENCOUNTER — Ambulatory Visit
Admission: RE | Admit: 2023-12-31 | Discharge: 2023-12-31 | Disposition: A | Discharge status: Home / Self Care | Source: Ambulatory Visit | Attending: Pulmonary Disease | Admitting: Pulmonary Disease

## 2023-12-31 ENCOUNTER — Encounter: Payer: Self-pay | Admitting: Pulmonary Disease

## 2023-12-31 ENCOUNTER — Ambulatory Visit
Admission: RE | Admit: 2023-12-31 | Discharge: 2023-12-31 | Disposition: A | Discharge status: Home / Self Care | Attending: Pulmonary Disease | Admitting: Pulmonary Disease

## 2023-12-31 ENCOUNTER — Ambulatory Visit: Payer: 59 | Admitting: Pulmonary Disease

## 2023-12-31 ENCOUNTER — Other Ambulatory Visit: Payer: Self-pay

## 2023-12-31 ENCOUNTER — Other Ambulatory Visit: Payer: Self-pay | Admitting: Internal Medicine

## 2023-12-31 ENCOUNTER — Encounter: Payer: Self-pay | Admitting: Internal Medicine

## 2023-12-31 VITALS — BP 120/80 | HR 71 | Temp 97.1°F | Ht 71.0 in | Wt 250.6 lb

## 2023-12-31 DIAGNOSIS — R0789 Other chest pain: Secondary | ICD-10-CM

## 2023-12-31 DIAGNOSIS — J441 Chronic obstructive pulmonary disease with (acute) exacerbation: Secondary | ICD-10-CM | POA: Diagnosis present

## 2023-12-31 DIAGNOSIS — C3411 Malignant neoplasm of upper lobe, right bronchus or lung: Secondary | ICD-10-CM

## 2023-12-31 MED ORDER — COMPRESSOR/NEBULIZER MISC
0 refills | Status: DC
  Filled 2023-12-31: qty 1, 30d supply, fill #0

## 2023-12-31 MED ORDER — ALBUTEROL SULFATE HFA 108 (90 BASE) MCG/ACT IN AERS
2.0000 | INHALATION_SPRAY | Freq: Four times a day (QID) | RESPIRATORY_TRACT | 6 refills | Status: DC | PRN
  Filled 2023-12-31: qty 6.7, 25d supply, fill #0

## 2023-12-31 MED ORDER — PREDNISONE 20 MG PO TABS
20.0000 mg | ORAL_TABLET | Freq: Every day | ORAL | 0 refills | Status: AC
  Filled 2023-12-31: qty 5, 5d supply, fill #0

## 2023-12-31 MED ORDER — AZITHROMYCIN 250 MG PO TABS
500.0000 mg | ORAL_TABLET | Freq: Every day | ORAL | 0 refills | Status: AC
  Filled 2023-12-31: qty 3, 3d supply, fill #0
  Filled 2023-12-31: qty 6, 3d supply, fill #0
  Filled 2023-12-31: qty 3, 3d supply, fill #0

## 2023-12-31 NOTE — Progress Notes (Signed)
 Subjective:    Patient ID: Chris [name], male    DOB: 1961/03/08, 63 y.o.   MRN: [id number]  Patient Care Team: Sherrie Mustache, MD as PCP - General (Internal Medicine) Runell Gess, MD as PCP - Cardiology (Cardiology) [name], MD as Consulting Physician (Oncology) Sherrie Mustache, MD as Referring Physician (Internal Medicine) Earna Coder, MD as Consulting Physician (Internal Medicine) Earna Coder, MD as Consulting Physician (Internal Medicine)  Chief Complaint  Patient presents with   Follow-up    SOB. Wheezing. Cough with white/yellow sputum.     BACKGROUND:Chris [name] is a 63 y.o. current smoker, with metastatic stage IV non-small cell lung cancer/favor adenocarcinoma, who is referred for evaluation and management of suspected COPD.  Previously followed by Drs. Byrum and Icard.  Patient is establishing care here.  HPI Discussed the use of AI scribe software for clinical note transcription with the patient, who gave verbal consent to proceed.  History of Present Illness   Chris [name] is a 63 year old male, current smoker, with suspected COPD who presents with shortness of breath and cough. He is accompanied by his wife. He was referred by Dr. Donneta Romberg for evaluation and management of suspected COPD.  He has been experiencing shortness of breath and cough for approximately three weeks.  He notes a "rattling" sound in his chest particularly when he coughs.  The cough is associated with a sensation of fullness in the sinuses and significant discomfort, initially thought to be due to a rib fracture from coughing. He experiences shortness of breath during activities and sometimes at rest, with a noticeable 'rattle' in his breathing. His wife observes his breathing issues more than he does. A recent chest x-ray was performed at an orthopedic trauma specialist's office, where he was also being treated for a broken femur due to cancer. The pain  began after his last CT scan on December 13, 2023.  He has a history of aggressive radiation treatment on his right lung, resulting in thickening and scarring, as noted in a previous CT scan. This history may contribute to his current respiratory symptoms. He also reports a chronic cough that can be triggered by environmental factors such as strong scents.  He has a history of squamous cell cancer and is under the care of an oncologist. He mentions experiencing 'chemo brain' and has been evaluated by a neurologist for cognitive changes. He is scheduled to see the neurologist again on Friday.  He is currently not using a nebulizer as he has the medication but not the device.  He is on Breztri 2 puffs twice a day but sometimes he uses it up to 3 times a day.  He has been prescribed meloxicam for suspected muscle strain but has not started it yet. He has previously used prednisone with some relief. He continues to smoke.   He has not had any fevers, chills or sweats.  No orthopnea or paroxysmal nocturnal dyspnea.  Cough is productive of whitish to occasionally yellowish sputum mostly in the mornings.  Most recent CT chest, abdomen and pelvis was performed 13 December 2023 this shows no evidence of metastatic disease in the abdomen or pelvis.  The parenchymal nodular thickening at the right lung apex is unchanged.  There is a fiducial marker adjacent to that nodular thickening.  There are brachytherapy seeds in the prostate gland.    Review of Systems A 10 point review of systems was performed and it is as noted above  otherwise negative.   Past Medical History:  Diagnosis Date   Anxiety    associated with medical care,  worsened by difficulty hearing, does better with wife present   Arthritis    Cancer associated pain    Cancer, metastatic to liver [facility])    Complication of anesthesia    wife states very anxious may need pre sedation   Coronary artery disease    Dyspnea    GERD  (gastroesophageal reflux disease)    Hearing loss    mild per wife   Hearing loss    no hearing aids per wife   Hyperlipidemia    Hypertension    MI (myocardial infarction) (HCC) 04/17/2006   Acute inferolateral wall MI with cardiogenic shock and complete heart block   Primary adenocarcinoma of upper lobe of right lung (HCC)    Prostate cancer (HCC)    Sleep apnea    Tobacco abuse    Wears glasses    Wears partial dentures    Upper    Past Surgical History:  Procedure Laterality Date   BRONCHIAL BIOPSY  10/09/2021   Procedure: BRONCHIAL BIOPSIES;  Surgeon: Leslye Peer, MD;  Location: [facility] ENDOSCOPY;  Service: Pulmonary;;   BRONCHIAL BRUSHINGS  10/09/2021   Procedure: BRONCHIAL BRUSHINGS;  Surgeon: Leslye Peer, MD;  Location: [facility] ENDOSCOPY;  Service: Pulmonary;;   BRONCHIAL NEEDLE ASPIRATION BIOPSY  10/09/2021   Procedure: BRONCHIAL NEEDLE ASPIRATION BIOPSIES;  Surgeon: Leslye Peer, MD;  Location: [facility] ENDOSCOPY;  Service: Pulmonary;;   CARDIAC CATHETERIZATION  2007   left, RCA 100% occluded ruptured plaque with thrombus in the proximal segment   CYST EXCISION N/A 08/30/2021   Procedure: EXCISION OF POSTERIOR SCALP CYST;  Surgeon: Berna Bue, MD;  Location: [facility] ORS;  Service: General;  Laterality: N/A;   CYSTOSCOPY N/A 07/17/2018   Procedure: Bluford Kaufmann;  Surgeon: Marcine Matar, MD;  Location: [facility];  Service: Urology;  Laterality: N/A;  no seeds in bladder per Dr Retta Diones   FEMUR IM NAIL Right 04/03/2023   Procedure: REVISION FIXATION OF RIGHT FEMUR NONUNION;  Surgeon: Roby Lofts, MD;  Location: [facility] OR;  Service: Orthopedics;  Laterality: Right;   FIDUCIAL MARKER PLACEMENT  10/09/2021   Procedure: FIDUCIAL MARKER PLACEMENT;  Surgeon: Leslye Peer, MD;  Location: [facility] ENDOSCOPY;  Service: Pulmonary;;   HAND SURGERY Right    has metal plate in arm   HARDWARE REMOVAL Right 04/03/2023   Procedure: REMOVAL OF PREVIOUS HARDWARE FEMUR;   Surgeon: Roby Lofts, MD;  Location: [facility] OR;  Service: Orthopedics;  Laterality: Right;   PROSTATE BIOPSY     RADIOACTIVE SEED IMPLANT N/A 07/17/2018   Procedure: RADIOACTIVE SEED IMPLANT/BRACHYTHERAPY IMPLANT;  Surgeon: Marcine Matar, MD;  Location: [facility];  Service: Urology;  Laterality: N/A;  77 seeds   SPACE OAR INSTILLATION N/A 07/17/2018   Procedure: SPACE OAR INSTILLATION;  Surgeon: Marcine Matar, MD;  Location: [facility];  Service: Urology;  Laterality: N/A;   VIDEO BRONCHOSCOPY WITH RADIAL ENDOBRONCHIAL ULTRASOUND  10/09/2021   Procedure: VIDEO BRONCHOSCOPY WITH RADIAL ENDOBRONCHIAL ULTRASOUND;  Surgeon: Leslye Peer, MD;  Location: [facility] ENDOSCOPY;  Service: Pulmonary;;   WRIST SURGERY  10/04/2012    Patient Active Problem List   Diagnosis Date Noted   Displaced intertrochanteric fracture of right femur, subsequent encounter for closed fracture with nonunion 04/03/2023   Subtrochanteric fracture of right femur, closed, with nonunion, subsequent encounter 03/21/2023   Abnormal liver CT  01/07/2023   Primary adenocarcinoma of upper lobe of right lung (HCC) 10/18/2021   Encounter for antineoplastic chemotherapy 10/18/2021   Encounter for antineoplastic immunotherapy 10/18/2021   Right upper lobe pulmonary nodule 09/27/2021   Squamous cell carcinoma of scalp 09/13/2021   Tobacco abuse 04/08/2018   Malignant neoplasm of prostate (HCC) 03/25/2018   Malignant neoplasm of prostate metastatic to bone (HCC) 03/25/2018   Coronary artery disease 10/27/2013   Hyperlipidemia 10/27/2013    Family History  Problem Relation Age of Onset   Breast cancer Mother    Prostate cancer Neg Hx    Kidney cancer Neg Hx    Cancer Neg Hx     Social History   Tobacco Use   Smoking status: Every Day    Current packs/day: 0.75    Average packs/day: 0.8 packs/day for 42.0 years (31.5 ttl pk-yrs)    Types: Cigarettes   Smokeless tobacco: Never    Tobacco comments:    Smokes 0.75-1 PPD - khj 12/31/2023    Started smoking at 63 years old    Smoked 2 PPD at his heaviest.   Substance Use Topics   Alcohol use: No    No Known Allergies  Current Meds  Medication Sig   ALPRAZolam (XANAX) 0.25 MG tablet Take 1 tablet (0.25 mg total) by mouth at bedtime. (Patient taking differently: Take 0.25 mg by mouth at bedtime as needed for anxiety.)   atorvastatin (LIPITOR) 20 MG tablet TAKE 1 TABLET BY MOUTH EVERY DAY   Budeson-Glycopyrrol-Formoterol (BREZTRI AEROSPHERE) 160-9-4.8 MCG/ACT AERO Inhale 2 puffs into the lungs in the morning and at bedtime.   clopidogrel (PLAVIX) 75 MG tablet TAKE 1 TABLET BY MOUTH EVERY DAY   fluticasone (FLONASE) 50 MCG/ACT nasal spray Place 1 spray into both nostrils daily as needed for allergies.   ketoconazole (NIZORAL) 2 % cream Apply 1 application  topically 2 (two) times daily as needed for irritation.   meloxicam (MOBIC) 7.5 MG tablet Take 1 tablet (7.5 mg total) by mouth daily.   metoprolol tartrate (LOPRESSOR) 25 MG tablet TAKE 1 TABLET (25 MG TOTAL) BY MOUTH DAILY.   Oxycodone HCl 10 MG TABS Take 0.5-1 tablets (5-10 mg total) by mouth every 4 (four) hours as needed.   pantoprazole (PROTONIX) 40 MG tablet Take 1 tablet (40 mg total) by mouth daily.   sennosides-docusate sodium (SENOKOT-S) 8.6-50 MG tablet Take 1 tablet by mouth daily.   sertraline (ZOLOFT) 100 MG tablet TAKE 1 TABLET BY MOUTH EVERY DAY   tamsulosin (FLOMAX) 0.4 MG CAPS capsule Take 1 capsule (0.4 mg total) by mouth daily.   Vitamin D, Ergocalciferol, (DRISDOL) 1.25 MG (50000 UNIT) CAPS capsule TAKE 1 CAPSULE BY MOUTH ONE TIME PER WEEK    Immunization History  Administered Date(s) Administered   PFIZER(Purple Top)SARS-COV-2 Vaccination 06/11/2020, 07/02/2020      Objective:   BP 120/80 (BP Location: Right Arm, Cuff Size: Normal)   Pulse 71   Temp (!) 97.1 F (36.2 C)   Ht 5\' 11"  (1.803 m)   Wt 250 lb 9.6 oz (113.7 kg)   SpO2 96%    BMI 34.95 kg/m   SpO2: 96 % O2 Device: None (Room air)  GENERAL: Well-developed, well-nourished gentleman, no acute distress.  Fully ambulatory.  No conversational dyspnea. HEAD: Normocephalic, atraumatic.  EYES: Pupils equal, round, reactive to light.  No scleral icterus.  MOUTH: Upper dentures, poor dentition lower, oral mucosa moist. NECK: Supple. No thyromegaly. Trachea midline. No JVD.  No adenopathy. PULMONARY: Good  air entry bilaterally.  Rhonchi and scattered wheezes throughout.Marland Kitchen CARDIOVASCULAR: S1 and S2. Regular rate and rhythm.  ABDOMEN: Benign. MUSCULOSKELETAL: No joint deformity, no clubbing, no edema.  NEUROLOGIC: No overt focal deficit. SKIN: Intact,warm,dry. PSYCH: Anxious, occasionally befuddled.       Assessment & Plan:     ICD-10-CM   1. COPD with acute exacerbation (HCC)  J44.1 DG Chest 2 View    2. Chest wall pain  R07.89 DG Chest 2 View    3. Primary adenocarcinoma of upper lobe of right lung (HCC)  C34.11       Orders Placed This Encounter  Procedures   DG Chest 2 View    Standing Status:   Future    Number of Occurrences:   1    Expected Date:   12/31/2023    Expiration Date:   12/30/2024    Reason for Exam (SYMPTOM  OR DIAGNOSIS REQUIRED):   Copd Exacerbation ? pneumonia    Preferred imaging location?:   [facility]    Meds ordered this encounter  Medications   Nebulizers (COMPRESSOR/NEBULIZER) MISC    Sig: Use as directed for nebulizer medication.    Dispense:  1 each    Refill:  0   azithromycin (ZITHROMAX) 250 MG tablet    Sig: Take 2 tablets (500 mg total) by mouth daily for 3 days.    Dispense:  6 tablet    Refill:  0   predniSONE (DELTASONE) 20 MG tablet    Sig: Take 1 tablet (20 mg total) by mouth daily with breakfast for 5 days.    Dispense:  5 tablet    Refill:  0   albuterol (VENTOLIN HFA) 108 (90 Base) MCG/ACT inhaler    Sig: Inhale 2 puffs into the lungs every 6 (six) hours as needed for wheezing or shortness of breath.     Dispense:  6.7 g    Refill:  6   Discussion:    Chronic Obstructive Pulmonary Disease (COPD) Macari Zalesky presents with shortness of breath, coughing, and chest pain for three weeks, worsened by activity. Symptoms include a rattling sound in the chest. He has a history of smoking and radiation therapy for adenocarcinoma of the lung, contributing to lung damage. Suspected potential pneumonia and muscle strain from coughing. Discussed nebulizer use and smoking cessation. - Order chest x-ray - Prescribe antibiotic (azithromycin) - Prescribe short course of prednisone - Provide nebulizer and demonstrate its use (patient has neb solution at home) - Advised re: smoking cessation - Follow up in 4-6 weeks  Muscle Strain Chest pain likely due to muscle strain from coughing. Recent x-rays did not confirm a fracture. Meloxicam previously recommended by Dr. B for pain management. - Prescribe meloxicam as previously recommended by Dr. Leonard Schwartz  Squamous Cell Cancer Under oncologist Dr. Senaida Lange care for adenocarcinoma of the lung and squamous cell cancer of the scalp with prior aggressive radiation therapy to the right lung. Recent CT scans show thickening in the treated area and minimal atelectasis in the left lung. Experiencing 'chemo brain' affecting memory. Discussed potential scarring and thickening of bronchial tubes due to radiation. - Continue follow-up with oncologist - Monitor for any new symptoms or changes  General Health Maintenance Continues to smoke, detrimental to lung health given COPD and lung cancer history. - Advise smoking cessation.     Advised if symptoms do not improve or worsen, to please contact office for sooner follow up or seek emergency care.    I spent 42  minutes of dedicated to the care of this patient on the date of this encounter to include pre-visit review of records, face-to-face time with the patient discussing conditions above, post visit ordering of testing,  clinical documentation with the electronic health record, making appropriate referrals as documented, and communicating necessary findings to members of the patients care team.   C. Danice Goltz, MD Advanced Bronchoscopy [facility]    *This note was dictated using voice recognition software/Dragon.  Despite best efforts to proofread, errors can occur which can change the meaning. Any transcriptional errors that result from this process are unintentional and may not be fully corrected at the time of dictation.

## 2023-12-31 NOTE — Patient Instructions (Signed)
 VISIT SUMMARY:  Today, we discussed your ongoing symptoms of shortness of breath and cough, which have been present for about three weeks. We reviewed your history of smoking, radiation treatment for squamous cell cancer, and recent imaging results. We also talked about your muscle strain from coughing and your ongoing cancer treatment.  YOUR PLAN:  -CHRONIC OBSTRUCTIVE PULMONARY DISEASE (COPD): COPD is a chronic lung disease that makes it hard to breathe. It is often caused by smoking and can be worsened by lung damage from other conditions. We will order a chest x-ray, prescribe an antibiotic and a short course of prednisone to help with inflammation, and provide a nebulizer to help you breathe more easily. It is very important that you stop smoking to prevent further damage to your lungs. We will follow up in 4-6 weeks to see how you are doing.  -MUSCLE STRAIN: Your chest pain is likely due to muscle strain from coughing. We will prescribe meloxicam for pain management as previously recommended by Dr. Leonard Schwartz.  -SQUAMOUS CELL CANCER: You are under the care of your oncologist, Dr. B, for squamous cell cancer and have had aggressive radiation therapy to your right lung. This has caused some thickening and scarring in your lungs. We will continue to monitor your condition and you should report any new symptoms or changes.  -GENERAL HEALTH MAINTENANCE: It is crucial for your lung health to stop smoking, especially given your COPD and lung cancer history. Quitting smoking will help improve your overall health and prevent further lung damage.  INSTRUCTIONS:  Please follow up in 4-6 weeks for a re-evaluation of your COPD symptoms. Continue your follow-up with your oncologist and report any new symptoms or changes. Use the nebulizer as demonstrated and take the prescribed medications as directed. It is very important to stop smoking to improve your lung health.

## 2024-01-02 ENCOUNTER — Encounter: Payer: Self-pay | Admitting: Internal Medicine

## 2024-01-02 ENCOUNTER — Telehealth: Payer: Self-pay | Admitting: *Deleted

## 2024-01-02 ENCOUNTER — Other Ambulatory Visit: Payer: Self-pay

## 2024-01-02 ENCOUNTER — Encounter: Payer: Self-pay | Admitting: Pulmonary Disease

## 2024-01-02 ENCOUNTER — Other Ambulatory Visit: Payer: Self-pay | Admitting: Internal Medicine

## 2024-01-02 MED ORDER — SERTRALINE HCL 100 MG PO TABS
100.0000 mg | ORAL_TABLET | Freq: Every day | ORAL | 3 refills | Status: DC
  Filled 2024-01-02: qty 90, 90d supply, fill #0

## 2024-01-02 NOTE — Telephone Encounter (Signed)
 The wife states that they did see the pulmonary MD and on Tuesday and he had rattling in the lung. But she says that she feels like he is in the back in from getting over pneumonia' she gave him low dose prednisone and Zithromax 250mg . He is eating and she is trying to make him to drink and get in recliner, she wabted you to know his status. Wife wants to have video with [facility] because it so hard to get around with the leg trying to get better healing

## 2024-01-02 NOTE — Telephone Encounter (Signed)
 Radiologist has not reviewed the films yet.  I have reviewed the films independently,however, and do not see any active issues.  However if there is any change from the radiologist interpretation we will notify as soon as this is available.  There have been significant delays in radiology reading studies.

## 2024-01-03 ENCOUNTER — Other Ambulatory Visit: Payer: Self-pay | Admitting: Cardiovascular Disease

## 2024-01-03 ENCOUNTER — Inpatient Hospital Stay: Payer: 59 | Admitting: Internal Medicine

## 2024-01-04 ENCOUNTER — Encounter: Payer: Self-pay | Admitting: Pulmonary Disease

## 2024-01-07 ENCOUNTER — Ambulatory Visit: Payer: 59 | Admitting: Pulmonary Disease

## 2024-01-08 ENCOUNTER — Ambulatory Visit: Payer: BC Managed Care – PPO | Admitting: Pulmonary Disease

## 2024-01-10 ENCOUNTER — Ambulatory Visit: Payer: 59 | Admitting: Nurse Practitioner

## 2024-01-20 ENCOUNTER — Ambulatory Visit: Payer: 59 | Admitting: Cardiovascular Disease

## 2024-01-24 ENCOUNTER — Encounter: Payer: Self-pay | Admitting: Internal Medicine

## 2024-01-27 ENCOUNTER — Encounter: Payer: Self-pay | Admitting: Cardiovascular Disease

## 2024-01-28 ENCOUNTER — Ambulatory Visit: Admitting: Cardiovascular Disease

## 2024-01-29 ENCOUNTER — Other Ambulatory Visit: Payer: Self-pay | Admitting: Student

## 2024-01-29 DIAGNOSIS — S7221XR Displaced subtrochanteric fracture of right femur, subsequent encounter for open fracture type IIIA, IIIB, or IIIC with malunion: Secondary | ICD-10-CM

## 2024-01-29 DIAGNOSIS — S7221XK Displaced subtrochanteric fracture of right femur, subsequent encounter for closed fracture with nonunion: Secondary | ICD-10-CM

## 2024-01-30 ENCOUNTER — Encounter: Payer: Self-pay | Admitting: Hospice and Palliative Medicine

## 2024-01-30 ENCOUNTER — Encounter: Payer: Self-pay | Admitting: Podiatry

## 2024-01-31 ENCOUNTER — Encounter: Payer: Self-pay | Admitting: Internal Medicine

## 2024-01-31 ENCOUNTER — Inpatient Hospital Stay (HOSPITAL_BASED_OUTPATIENT_CLINIC_OR_DEPARTMENT_OTHER): Admitting: Hospice and Palliative Medicine

## 2024-01-31 ENCOUNTER — Inpatient Hospital Stay: Attending: Internal Medicine | Admitting: Internal Medicine

## 2024-01-31 ENCOUNTER — Encounter: Payer: Self-pay | Admitting: Hospice and Palliative Medicine

## 2024-01-31 ENCOUNTER — Ambulatory Visit: Admitting: Podiatry

## 2024-01-31 ENCOUNTER — Ambulatory Visit
Admission: RE | Admit: 2024-01-31 | Discharge: 2024-01-31 | Disposition: A | Discharge status: Home / Self Care | Source: Ambulatory Visit | Attending: Student | Admitting: Student

## 2024-01-31 VITALS — BP 147/75 | HR 71 | Temp 98.6°F | Resp 20 | Wt 247.7 lb

## 2024-01-31 DIAGNOSIS — G893 Neoplasm related pain (acute) (chronic): Secondary | ICD-10-CM

## 2024-01-31 DIAGNOSIS — F17210 Nicotine dependence, cigarettes, uncomplicated: Secondary | ICD-10-CM | POA: Insufficient documentation

## 2024-01-31 DIAGNOSIS — Z79899 Other long term (current) drug therapy: Secondary | ICD-10-CM | POA: Insufficient documentation

## 2024-01-31 DIAGNOSIS — Z9221 Personal history of antineoplastic chemotherapy: Secondary | ICD-10-CM | POA: Insufficient documentation

## 2024-01-31 DIAGNOSIS — C7951 Secondary malignant neoplasm of bone: Secondary | ICD-10-CM | POA: Diagnosis present

## 2024-01-31 DIAGNOSIS — C3411 Malignant neoplasm of upper lobe, right bronchus or lung: Secondary | ICD-10-CM

## 2024-01-31 DIAGNOSIS — R4189 Other symptoms and signs involving cognitive functions and awareness: Secondary | ICD-10-CM | POA: Insufficient documentation

## 2024-01-31 DIAGNOSIS — S7221XK Displaced subtrochanteric fracture of right femur, subsequent encounter for closed fracture with nonunion: Secondary | ICD-10-CM

## 2024-01-31 DIAGNOSIS — Z8546 Personal history of malignant neoplasm of prostate: Secondary | ICD-10-CM | POA: Insufficient documentation

## 2024-01-31 DIAGNOSIS — Z515 Encounter for palliative care: Secondary | ICD-10-CM | POA: Diagnosis not present

## 2024-01-31 DIAGNOSIS — C787 Secondary malignant neoplasm of liver and intrahepatic bile duct: Secondary | ICD-10-CM | POA: Insufficient documentation

## 2024-01-31 NOTE — Progress Notes (Signed)
 Palliative Medicine [facility] at [facility] Telephone:([phone]) [phone] [phone]) [phone]   Name: Chris [name] Date: 01/31/2024 MRN: [id number]  DOB: 01-Jan-1961  Patient Care Team: Sherrie Mustache, MD as PCP - General (Internal Medicine) Runell Gess, MD as PCP - Cardiology (Cardiology) [name], MD as Consulting Physician (Oncology) Sherrie Mustache, MD as Referring Physician (Internal Medicine) Earna Coder, MD as Consulting Physician (Internal Medicine) Earna Coder, MD as Consulting Physician (Internal Medicine)    REASON FOR CONSULTATION: Chris [name] is a 63 y.o. male with multiple medical problems including stage IV non-small cell lung cancer with metastasis to femur/liver diagnosed in December 2022.  Patient is status post systemic chemotherapy and most recently on maintenance single agent Libtayo. He is status post right hip XRT.  Patient was found to have impending pathologic fracture of the right femur and underwent surgical pinning at [facility] by Ortho oncology.  Patient has subsequently required repeat surgeries to the hip.  SOCIAL HISTORY:     reports that he has been smoking cigarettes. He has a 31.5 pack-year smoking history. He has never used smokeless tobacco. He reports that he does not drink alcohol and does not use drugs.  Patient is married and lives at home with his wife.  They have a son and recently had grandchildren.  Patient works in [employer].  ADVANCE DIRECTIVES:  Not on file  CODE STATUS: Limited Code (CPR but no intubation, MOST form signed on 04/04/22)  PAST MEDICAL HISTORY: Past Medical History:  Diagnosis Date   Anxiety    associated with medical care,  worsened by difficulty hearing, does better with wife present   Arthritis    Cancer associated pain    Cancer, metastatic to liver [facility])    Complication of anesthesia    wife states very anxious may need pre sedation   Coronary  artery disease    Dyspnea    GERD (gastroesophageal reflux disease)    Hearing loss    mild per wife   Hearing loss    no hearing aids per wife   Hyperlipidemia    Hypertension    MI (myocardial infarction) (HCC) 04/17/2006   Acute inferolateral wall MI with cardiogenic shock and complete heart block   Primary adenocarcinoma of upper lobe of right lung (HCC)    Prostate cancer (HCC)    Sleep apnea    Tobacco abuse    Wears glasses    Wears partial dentures    Upper    PAST SURGICAL HISTORY:  Past Surgical History:  Procedure Laterality Date   BRONCHIAL BIOPSY  10/09/2021   Procedure: BRONCHIAL BIOPSIES;  Surgeon: Leslye Peer, MD;  Location: [facility] ENDOSCOPY;  Service: Pulmonary;;   BRONCHIAL BRUSHINGS  10/09/2021   Procedure: BRONCHIAL BRUSHINGS;  Surgeon: Leslye Peer, MD;  Location: [facility] ENDOSCOPY;  Service: Pulmonary;;   BRONCHIAL NEEDLE ASPIRATION BIOPSY  10/09/2021   Procedure: BRONCHIAL NEEDLE ASPIRATION BIOPSIES;  Surgeon: Leslye Peer, MD;  Location: [facility] ENDOSCOPY;  Service: Pulmonary;;   CARDIAC CATHETERIZATION  2007   left, RCA 100% occluded ruptured plaque with thrombus in the proximal segment   CYST EXCISION N/A 08/30/2021   Procedure: EXCISION OF POSTERIOR SCALP CYST;  Surgeon: Berna Bue, MD;  Location: [facility] ORS;  Service: General;  Laterality: N/A;   CYSTOSCOPY N/A 07/17/2018   Procedure: Bluford Kaufmann;  Surgeon: Marcine Matar, MD;  Location: [facility];  Service: Urology;  Laterality: N/A;  no seeds  in bladder per Dr Retta Diones   FEMUR IM NAIL Right 04/03/2023   Procedure: REVISION FIXATION OF RIGHT FEMUR NONUNION;  Surgeon: Roby Lofts, MD;  Location: [facility] OR;  Service: Orthopedics;  Laterality: Right;   FIDUCIAL MARKER PLACEMENT  10/09/2021   Procedure: FIDUCIAL MARKER PLACEMENT;  Surgeon: Leslye Peer, MD;  Location: [facility] ENDOSCOPY;  Service: Pulmonary;;   HAND SURGERY Right    has metal plate in arm   HARDWARE REMOVAL Right  04/03/2023   Procedure: REMOVAL OF PREVIOUS HARDWARE FEMUR;  Surgeon: Roby Lofts, MD;  Location: [facility] OR;  Service: Orthopedics;  Laterality: Right;   PROSTATE BIOPSY     RADIOACTIVE SEED IMPLANT N/A 07/17/2018   Procedure: RADIOACTIVE SEED IMPLANT/BRACHYTHERAPY IMPLANT;  Surgeon: Marcine Matar, MD;  Location: [facility];  Service: Urology;  Laterality: N/A;  77 seeds   SPACE OAR INSTILLATION N/A 07/17/2018   Procedure: SPACE OAR INSTILLATION;  Surgeon: Marcine Matar, MD;  Location[facility];  Service: Urology;  Laterality: N/A;   VIDEO BRONCHOSCOPY WITH RADIAL ENDOBRONCHIAL ULTRASOUND  10/09/2021   Procedure: VIDEO BRONCHOSCOPY WITH RADIAL ENDOBRONCHIAL ULTRASOUND;  Surgeon: Leslye Peer, MD;  Location: [facility] ENDOSCOPY;  Service: Pulmonary;;   WRIST SURGERY  10/04/2012    HEMATOLOGY/ONCOLOGY HISTORY:  Oncology History Overview Note  DIAGNOSIS: 1) stage IV (T1c, N0, M1 C) non-small cell lung cancer favoring adenocarcinoma presented with right lung apical nodule in addition to metastatic disease in the right hepatic lobe and the proximal right femoral diaphysis diagnosed and December 2022. 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    QNS; Guardant 360 showed positive KRAS G12C mutation  FINAL MICROSCOPIC DIAGNOSIS:   A. LUNG, RUL, FINE NEEDLE ASPIRATION:  - Malignant cells consistent with non-small cell carcinoma, see comment   B. LUNG, RUL, BRUSHING:  - Malignant cells consistent with non-small cell carcinoma, see comment       COMMENT:   A and B.  Dr. Luisa Hart reviewed the case and concurs with the diagnosis.  Only rare malignant cells are present on the cellblock.  Immunohistochemical stains were attempted and show that the tumor cells  have patchy staining for TTF-1 whereas p63, p40 and CK5/6 are negative.  The findings are nondiagnostic but  suggestive of a lung adenocarcinoma.  Dr. Delton Coombes was notified on 10/13/2021.   SEP-OCT 2022-  [Dermatology]-   Poorly differentiated squamous cell carcinoma; -  Carcinoma extends to the edges of the excision specimen   IMPRESSION: 1. The spiculated nodule at the right lung apex on recent neck CT is hypermetabolic and is concerning for primary bronchogenic carcinoma. Tissue sampling recommended. 2. Hypermetabolic activity within the occipital scalp and small previously demonstrated right occipital lymph node compatible with known squamous cell carcinoma. Evaluation of the head and neck limited by motion artifact. 3. Hypermetabolic lesions inferiorly in the right hepatic lobe and in the proximal right femoral diaphysis suspicious for metastatic disease, primary uncertain in this patient with a history of prostate cancer. Correlate with PSA levels. Abdominal MRI without and with contrast may be helpful for further characterization of the liver lesion.  # s/p RT to RUL [[place]]; right hip RT; # BONE METASTASES:  Impending right hip fracture right hip pain-status post radiation [[facility]; DEC 2022]- S/p  Re-RT on 10/03 x5 Fx. NOV 2023-status post intramedullary nail fixation;JUNE 2024 [[place]] Re-surgery-    Awaiting dental clearance for zometa.    # Leontios.Halon [  s/p stent-Dr.Berry; EF 2021- 58%]     Primary adenocarcinoma of upper lobe of right lung (HCC)  10/18/2021 Initial Diagnosis   Primary adenocarcinoma of upper lobe of right lung (HCC)   10/18/2021 Cancer Staging   Staging form: Lung, AJCC 8th Edition - Clinical: Stage IVB (cT1c, cN0, cM1c) - Signed by [name], MD on 10/18/2021   11/01/2021 - 05/30/2022 Chemotherapy   Patient is on Treatment Plan : LUNG NSCLC Pemetrexed + Carboplatin q21d x 4 Cycles     11/01/2021 -  Chemotherapy   Patient is on Treatment Plan : LUNG NSCLC Libtayo q  21d        ALLERGIES:  has no known allergies.  MEDICATIONS:  Current Outpatient Medications   Medication Sig Dispense Refill   albuterol (PROVENTIL) (2.5 MG/3ML) 0.083% nebulizer solution Take 3 mLs (2.5 mg total) by nebulization every 6 (six) hours as needed for wheezing or shortness of breath. (Patient not taking: Reported on 01/31/2024) 75 mL 12   albuterol (VENTOLIN HFA) 108 (90 Base) MCG/ACT inhaler Inhale 2 puffs into the lungs every 6 (six) hours as needed for wheezing or shortness of breath. 6.7 g 6   ALPRAZolam (XANAX) 0.25 MG tablet Take 1 tablet (0.25 mg total) by mouth at bedtime. (Patient taking differently: Take 0.25 mg by mouth at bedtime as needed for anxiety.) 15 tablet 0   atorvastatin (LIPITOR) 20 MG tablet TAKE 1 TABLET BY MOUTH EVERY DAY 90 tablet 3   Budeson-Glycopyrrol-Formoterol (BREZTRI AEROSPHERE) 160-9-4.8 MCG/ACT AERO Inhale 2 puffs into the lungs in the morning and at bedtime. 10.7 g 6   clopidogrel (PLAVIX) 75 MG tablet TAKE 1 TABLET BY MOUTH EVERY DAY 90 tablet 2   fluticasone (FLONASE) 50 MCG/ACT nasal spray Place 1 spray into both nostrils daily as needed for allergies.     ketoconazole (NIZORAL) 2 % cream Apply 1 application  topically 2 (two) times daily as needed for irritation.     meloxicam (MOBIC) 7.5 MG tablet Take 1 tablet (7.5 mg total) by mouth daily. 14 tablet 0   metoprolol tartrate (LOPRESSOR) 25 MG tablet TAKE 1 TABLET (25 MG TOTAL) BY MOUTH DAILY. 90 tablet 1   Nebulizers (COMPRESSOR/NEBULIZER) MISC Use as directed for nebulizer medication. 1 each 0   Oxycodone HCl 10 MG TABS Take 0.5-1 tablets (5-10 mg total) by mouth every 4 (four) hours as needed. 60 tablet 0   pantoprazole (PROTONIX) 40 MG tablet Take 1 tablet (40 mg total) by mouth daily. 60 tablet 1   sennosides-docusate sodium (SENOKOT-S) 8.6-50 MG tablet Take 1 tablet by mouth daily. 60 tablet 1   sertraline (ZOLOFT) 100 MG tablet TAKE 1 TABLET BY MOUTH EVERY DAY 90 tablet 0   sertraline (ZOLOFT) 100 MG tablet Take 1 tablet (100 mg total) by mouth daily. 90 tablet 3   tamsulosin  (FLOMAX) 0.4 MG CAPS capsule Take 1 capsule (0.4 mg total) by mouth daily. 30 capsule 3   Vitamin D, Ergocalciferol, (DRISDOL) 1.25 MG (50000 UNIT) CAPS capsule TAKE 1 CAPSULE BY MOUTH ONE TIME PER WEEK 12 capsule 1   No current facility-administered medications for this visit.    VITAL SIGNS: There were no vitals taken for this visit. There were no vitals filed for this visit.  Estimated body mass index is 34.55 kg/m as calculated from the following:   Height as of 12/31/23: 5\' 11"  (1.803 m).   Weight as of an earlier encounter on 01/31/24: 247 lb 11.2 oz (112.4 kg).  LABS: CBC:  Component Value Date/Time   WBC 6.3 12/27/2023 0824   HGB 15.5 12/27/2023 0824   HGB 14.5 08/27/2023 1331   HCT 46.1 12/27/2023 0824   PLT 213 12/27/2023 0824   PLT 202 08/27/2023 1331   MCV 88.1 12/27/2023 0824   NEUTROABS 4.1 12/27/2023 0824   LYMPHSABS 1.1 12/27/2023 0824   MONOABS 0.9 12/27/2023 0824   EOSABS 0.1 12/27/2023 0824   BASOSABS 0.0 12/27/2023 0824   Comprehensive Metabolic Panel:    Component Value Date/Time   NA 138 12/27/2023 0824   K 4.0 12/27/2023 0824   CL 105 12/27/2023 0824   CO2 23 12/27/2023 0824   BUN 20 12/27/2023 0824   CREATININE 0.97 12/27/2023 0824   CREATININE 1.07 08/27/2023 1331   GLUCOSE 105 (H) 12/27/2023 0824   CALCIUM 9.0 12/27/2023 0824   AST 19 12/27/2023 0824   AST 22 08/27/2023 1331   ALT 23 12/27/2023 0824   ALT 20 08/27/2023 1331   ALKPHOS 75 12/27/2023 0824   BILITOT 0.6 12/27/2023 0824   BILITOT 0.6 08/27/2023 1331   PROT 7.6 12/27/2023 0824   ALBUMIN 4.2 12/27/2023 0824    RADIOGRAPHIC STUDIES: No results found.  PERFORMANCE STATUS (ECOG) : 1 - Symptomatic but completely ambulatory  Review of Systems Unless otherwise noted, a complete review of systems is negative.  Physical Exam General: NAD Pulmonary: Unlabored Extremities: no edema, no joint deformities Skin: Scaly, erythematous rash beneath pannus Neurological: Weakness but  otherwise nonfocal  IMPRESSION: Patient was an add-on to my clinic schedule today at wife's request.  Unfortunately, patient reports failed hardware in the right hip leading to possible recurrent fracture.  He is being followed by trauma Ortho specialist in [place] with plan for CT later today and possible surgery next week.  Patient endorses acute right hip pain.  He has restarted oxycodone IR 10 mg every 4 hours as needed.  He is occasionally having to take 2 tablets to find pain relief.  He also restarted Xtampza ER 9 mg every 12 hours.  Both of these medications were last refilled in January 2025.  Wife wanted to ensure that pain regimen was still clinically appropriate.  Patient has tolerated this regimen in the past without any adverse effects.  He appears to be tolerating it now.  Okay with resuming Xtampza/oxycodone IR.  Refill when appropriate.  PLAN: -Continue current scope of treatment -Okay with restarting Xtampza ER/oxycodone IR -Daily bowel regimen -Follow-up with Ortho -Follow-up telephone visit 1 month   Patient expressed understanding and was in agreement with this plan. He also understands that He can call the clinic at any time with any questions, concerns, or complaints.     Time Total: 15 minutes  Visit consisted of counseling and education dealing with the complex and emotionally intense issues of symptom management and palliative care in the setting of serious and potentially life-threatening illness.Greater than 50%  of this time was spent counseling and coordinating care related to the above assessment and plan.  Signed by: Laurette Schimke, PhD, NP-C

## 2024-01-31 NOTE — Progress Notes (Deleted)
 [facility] [address]  [place], [place] [address] [phone]   New Patient Evaluation  Date of Service: 01/31/24 Patient Name: Chris [name] Patient MRN: [id number] Patient DOB: [birth year] Provider: Henreitta Leber, MD  Identifying Statement:  Chris [name] is a 63 y.o. male with Cognitive changes who presents for initial consultation and evaluation regarding cancer associated neurologic deficits.    Referring Provider: Earna Coder, MD [address] [place],  [place] [address]  Primary Cancer:  Oncologic History: Oncology History Overview Note  DIAGNOSIS: 1) stage IV (T1c, N0, M1 C) non-small cell lung cancer favoring adenocarcinoma presented with right lung apical nodule in addition to metastatic disease in the right hepatic lobe and the proximal right femoral diaphysis diagnosed and December 2022. 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    QNS; Guardant 360 showed positive KRAS G12C mutation  FINAL MICROSCOPIC DIAGNOSIS:   A. LUNG, RUL, FINE NEEDLE ASPIRATION:  - Malignant cells consistent with non-small cell carcinoma, see comment   B. LUNG, RUL, BRUSHING:  - Malignant cells consistent with non-small cell carcinoma, see comment       COMMENT:   A and B.  Dr. Luisa Hart reviewed the case and concurs with the diagnosis.  Only rare malignant cells are present on the cellblock.  Immunohistochemical stains were attempted and show that the tumor cells  have patchy staining for TTF-1 whereas p63, p40 and CK5/6 are negative.  The findings are nondiagnostic but suggestive of a lung adenocarcinoma.  Dr. Delton Coombes was notified on 10/13/2021.   SEP-OCT 2022-  [Dermatology]-   Poorly differentiated squamous cell carcinoma; -  Carcinoma extends to the edges of the excision specimen   IMPRESSION: 1. The spiculated nodule at the  right lung apex on recent neck CT is hypermetabolic and is concerning for primary bronchogenic carcinoma. Tissue sampling recommended. 2. Hypermetabolic activity within the occipital scalp and small previously demonstrated right occipital lymph node compatible with known squamous cell carcinoma. Evaluation of the head and neck limited by motion artifact. 3. Hypermetabolic lesions inferiorly in the right hepatic lobe and in the proximal right femoral diaphysis suspicious for metastatic disease, primary uncertain in this patient with a history of prostate cancer. Correlate with PSA levels. Abdominal MRI without and with contrast may be helpful for further characterization of the liver lesion.  # s/p RT to RUL [[place]]; right hip RT; # BONE METASTASES:  Impending right hip fracture right hip pain-status post radiation [[facility]; DEC 2022]- S/p  Re-RT on 10/03 x5 Fx. NOV 2023-status post intramedullary nail fixation;JUNE 2024 [[place]] Re-surgery-    Awaiting dental clearance for zometa.    # 2007MI-CAD [s/p stent-Dr.Berry; EF 2021- 58%]     Primary adenocarcinoma of upper lobe of right lung (HCC)  10/18/2021 Initial Diagnosis   Primary adenocarcinoma of upper lobe of right lung (HCC)   10/18/2021 Cancer Staging   Staging form: Lung, AJCC 8th Edition - Clinical: Stage IVB (cT1c, cN0, cM1c) - Signed by [name], MD on 10/18/2021   11/01/2021 - 05/30/2022 Chemotherapy   Patient is on Treatment Plan : LUNG NSCLC Pemetrexed + Carboplatin q21d x 4 Cycles     11/01/2021 -  Chemotherapy   Patient is on Treatment Plan : LUNG NSCLC Libtayo q  21d        History of Present Illness: The patient's records from the referring physician were obtained and  reviewed and the patient interviewed to confirm this HPI.  Chris [name]  Medications: Current Outpatient Medications on File Prior to Visit  Medication Sig Dispense Refill   albuterol (PROVENTIL) (2.5 MG/3ML) 0.083% nebulizer solution Take 3 mLs  (2.5 mg total) by nebulization every 6 (six) hours as needed for wheezing or shortness of breath. (Patient not taking: Reported on 12/31/2023) 75 mL 12   albuterol (VENTOLIN HFA) 108 (90 Base) MCG/ACT inhaler Inhale 2 puffs into the lungs every 6 (six) hours as needed for wheezing or shortness of breath. 6.7 g 6   ALPRAZolam (XANAX) 0.25 MG tablet Take 1 tablet (0.25 mg total) by mouth at bedtime. (Patient taking differently: Take 0.25 mg by mouth at bedtime as needed for anxiety.) 15 tablet 0   atorvastatin (LIPITOR) 20 MG tablet TAKE 1 TABLET BY MOUTH EVERY DAY 90 tablet 3   Budeson-Glycopyrrol-Formoterol (BREZTRI AEROSPHERE) 160-9-4.8 MCG/ACT AERO Inhale 2 puffs into the lungs in the morning and at bedtime. 10.7 g 6   clopidogrel (PLAVIX) 75 MG tablet TAKE 1 TABLET BY MOUTH EVERY DAY 90 tablet 2   fluticasone (FLONASE) 50 MCG/ACT nasal spray Place 1 spray into both nostrils daily as needed for allergies.     ketoconazole (NIZORAL) 2 % cream Apply 1 application  topically 2 (two) times daily as needed for irritation.     meloxicam (MOBIC) 7.5 MG tablet Take 1 tablet (7.5 mg total) by mouth daily. 14 tablet 0   metoprolol tartrate (LOPRESSOR) 25 MG tablet TAKE 1 TABLET (25 MG TOTAL) BY MOUTH DAILY. 90 tablet 1   Nebulizers (COMPRESSOR/NEBULIZER) MISC Use as directed for nebulizer medication. 1 each 0   Oxycodone HCl 10 MG TABS Take 0.5-1 tablets (5-10 mg total) by mouth every 4 (four) hours as needed. 60 tablet 0   pantoprazole (PROTONIX) 40 MG tablet Take 1 tablet (40 mg total) by mouth daily. 60 tablet 1   sennosides-docusate sodium (SENOKOT-S) 8.6-50 MG tablet Take 1 tablet by mouth daily. 60 tablet 1   sertraline (ZOLOFT) 100 MG tablet TAKE 1 TABLET BY MOUTH EVERY DAY 90 tablet 0   sertraline (ZOLOFT) 100 MG tablet Take 1 tablet (100 mg total) by mouth daily. 90 tablet 3   tamsulosin (FLOMAX) 0.4 MG CAPS capsule Take 1 capsule (0.4 mg total) by mouth daily. 30 capsule 3   Vitamin D,  Ergocalciferol, (DRISDOL) 1.25 MG (50000 UNIT) CAPS capsule TAKE 1 CAPSULE BY MOUTH ONE TIME PER WEEK 12 capsule 1   No current facility-administered medications on file prior to visit.    Allergies: No Known Allergies Past Medical History:  Past Medical History:  Diagnosis Date   Anxiety    associated with medical care,  worsened by difficulty hearing, does better with wife present   Arthritis    Cancer associated pain    Cancer, metastatic to liver [facility])    Complication of anesthesia    wife states very anxious may need pre sedation   Coronary artery disease    Dyspnea    GERD (gastroesophageal reflux disease)    Hearing loss    mild per wife   Hearing loss    no hearing aids per wife   Hyperlipidemia    Hypertension    MI (myocardial infarction) (HCC) 04/17/2006   Acute inferolateral wall MI with cardiogenic shock and complete heart block   Primary adenocarcinoma of upper lobe of right lung (HCC)    Prostate cancer (HCC)    Sleep apnea  Tobacco abuse    Wears glasses    Wears partial dentures    Upper   Past Surgical History:  Past Surgical History:  Procedure Laterality Date   BRONCHIAL BIOPSY  10/09/2021   Procedure: BRONCHIAL BIOPSIES;  Surgeon: Leslye Peer, MD;  Location: [facility] ENDOSCOPY;  Service: Pulmonary;;   BRONCHIAL BRUSHINGS  10/09/2021   Procedure: BRONCHIAL BRUSHINGS;  Surgeon: Leslye Peer, MD;  Location: [facility] ENDOSCOPY;  Service: Pulmonary;;   BRONCHIAL NEEDLE ASPIRATION BIOPSY  10/09/2021   Procedure: BRONCHIAL NEEDLE ASPIRATION BIOPSIES;  Surgeon: Leslye Peer, MD;  Location: [facility] ENDOSCOPY;  Service: Pulmonary;;   CARDIAC CATHETERIZATION  2007   left, RCA 100% occluded ruptured plaque with thrombus in the proximal segment   CYST EXCISION N/A 08/30/2021   Procedure: EXCISION OF POSTERIOR SCALP CYST;  Surgeon: Berna Bue, MD;  Location: [facility] ORS;  Service: General;  Laterality: N/A;   CYSTOSCOPY N/A 07/17/2018   Procedure: Bluford Kaufmann;   Surgeon: Marcine Matar, MD;  Location: [facility];  Service: Urology;  Laterality: N/A;  no seeds in bladder per Dr Retta Diones   FEMUR IM NAIL Right 04/03/2023   Procedure: REVISION FIXATION OF RIGHT FEMUR NONUNION;  Surgeon: Roby Lofts, MD;  Location: [facility] OR;  Service: Orthopedics;  Laterality: Right;   FIDUCIAL MARKER PLACEMENT  10/09/2021   Procedure: FIDUCIAL MARKER PLACEMENT;  Surgeon: Leslye Peer, MD;  Location: [facility] ENDOSCOPY;  Service: Pulmonary;;   HAND SURGERY Right    has metal plate in arm   HARDWARE REMOVAL Right 04/03/2023   Procedure: REMOVAL OF PREVIOUS HARDWARE FEMUR;  Surgeon: Roby Lofts, MD;  Location: [facility] OR;  Service: Orthopedics;  Laterality: Right;   PROSTATE BIOPSY     RADIOACTIVE SEED IMPLANT N/A 07/17/2018   Procedure: RADIOACTIVE SEED IMPLANT/BRACHYTHERAPY IMPLANT;  Surgeon: Marcine Matar, MD;  Location: [facility];  Service: Urology;  Laterality: N/A;  77 seeds   SPACE OAR INSTILLATION N/A 07/17/2018   Procedure: SPACE OAR INSTILLATION;  Surgeon: Marcine Matar, MD;  Location: [facility];  Service: Urology;  Laterality: N/A;   VIDEO BRONCHOSCOPY WITH RADIAL ENDOBRONCHIAL ULTRASOUND  10/09/2021   Procedure: VIDEO BRONCHOSCOPY WITH RADIAL ENDOBRONCHIAL ULTRASOUND;  Surgeon: Leslye Peer, MD;  Location: [facility] ENDOSCOPY;  Service: Pulmonary;;   WRIST SURGERY  10/04/2012   Social History:  Social History   Socioeconomic History   Marital status: Married    Spouse name: Not on file   Number of children: Not on file   Years of education: Not on file   Highest education level: Not on file  Occupational History   Not on file  Tobacco Use   Smoking status: Every Day    Current packs/day: 0.75    Average packs/day: 0.8 packs/day for 42.0 years (31.5 ttl pk-yrs)    Types: Cigarettes   Smokeless tobacco: Never   Tobacco comments:    Smokes 0.75-1 PPD - khj 12/31/2023    Started smoking at 63 years old     Smoked 2 PPD at his heaviest.   Vaping Use   Vaping status: Never Used  Substance and Sexual Activity   Alcohol use: No   Drug use: No   Sexual activity: Yes    Birth control/protection: None  Other Topics Concern   Not on file  Social History Narrative   10-04 19 Unable to ask abuse questions wife with him today.   Are you right handed or left handed? Ambidextrous prominent right  Are you currently employed ? 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 no   Who lives with you? Wife and patient   What type of home do you live in: 1 story or 2 story? 1 story       Social Drivers of Corporate investment banker Strain: Not on file  Food Insecurity: No Food Insecurity (04/03/2023)   Hunger Vital Sign    Worried About Running Out of Food in the Last Year: Never true    Ran Out of Food in the Last Year: Never true  Transportation Needs: No Transportation Needs (04/03/2023)   PRAPARE - Administrator, Civil Service (Medical): No    Lack of Transportation (Non-Medical): No  Physical Activity: Not on file  Stress: Not on file  Social Connections: Not on file  Intimate Partner Violence: Not At Risk (04/03/2023)   Humiliation, Afraid, Rape, and Kick questionnaire    Fear of Current or Ex-Partner: No    Emotionally Abused: No    Physically Abused: No    Sexually Abused: No   Family History:  Family History  Problem Relation Age of Onset   Breast cancer Mother    Prostate cancer Neg Hx    Kidney cancer Neg Hx    Cancer Neg Hx     Review of Systems: Constitutional: Doesn't report fevers, chills or abnormal weight loss Eyes: Doesn't report blurriness of vision Ears, nose, mouth, throat, and face: Doesn't report sore throat Respiratory: Doesn't report cough, dyspnea or wheezes Cardiovascular: Doesn't report palpitation, chest discomfort  Gastrointestinal:  Doesn't report nausea, constipation, diarrhea GU: Doesn't report  incontinence Skin: Doesn't report skin rashes Neurological: Per HPI Musculoskeletal: Doesn't report joint pain Behavioral/Psych: Doesn't report anxiety  Physical Exam: There were no vitals filed for this visit. KPS: {KPS:19197::"100","90","80","70","60","50","40"}. General: Alert, cooperative, pleasant, in no acute distress Head: Normal EENT: No conjunctival injection or scleral icterus.  Lungs: Resp effort normal Cardiac: Regular rate Abdomen: Non-distended abdomen Skin: No rashes cyanosis or petechiae. Extremities: No clubbing or edema  Neurologic Exam: Mental Status: Awake, alert, attentive to examiner. Oriented to self and environment. Language is fluent with intact comprehension.  Cranial Nerves: Visual acuity is grossly normal. Visual fields are full. Extra-ocular movements intact. No ptosis. Face is symmetric Motor: Tone and bulk are normal. Power is full in both arms and legs. Reflexes are symmetric, no pathologic reflexes present.  Sensory: Intact to light touch Gait: Normal.   Labs: I have reviewed the data as listed    Component Value Date/Time   NA 138 12/27/2023 0824   K 4.0 12/27/2023 0824   CL 105 12/27/2023 0824   CO2 23 12/27/2023 0824   GLUCOSE 105 (H) 12/27/2023 0824   BUN 20 12/27/2023 0824   CREATININE 0.97 12/27/2023 0824   CREATININE 1.07 08/27/2023 1331   CALCIUM 9.0 12/27/2023 0824   PROT 7.6 12/27/2023 0824   ALBUMIN 4.2 12/27/2023 0824   AST 19 12/27/2023 0824   AST 22 08/27/2023 1331   ALT 23 12/27/2023 0824   ALT 20 08/27/2023 1331   ALKPHOS 75 12/27/2023 0824   BILITOT 0.6 12/27/2023 0824   BILITOT 0.6 08/27/2023 1331   GFRNONAA >60 12/27/2023 0824   GFRNONAA >60 08/27/2023 1331   GFRAA >60 07/10/2018 1356   Lab Results  Component Value Date   WBC 6.3 12/27/2023   NEUTROABS 4.1 12/27/2023   HGB 15.5 12/27/2023   HCT  46.1 12/27/2023   MCV 88.1 12/27/2023   PLT 213 12/27/2023    Imaging:  No results found.  [facility] Clinician  Interpretation: I have personally reviewed the radiological images as listed.  My interpretation, in the context of the patient's clinical presentation, is {MRI:19197::"progressive disease","stable disease","likely treatment effect","treatment effect vs true progression","non-enhancing progression"}   Assessment/Plan Cognitive changes   We spent twenty additional minutes teaching regarding the natural history, biology, and historical experience in the treatment of neurologic complications of cancer.   We appreciate the opportunity to participate in the care of Chris [name].   All questions were answered. The patient knows to call the clinic with any problems, questions or concerns. No barriers to learning were detected.  The total time spent in the encounter was {newpt:19197::"20 minutes","30 minutes","40 minutes","45 minutes","60 minutes"} and more than 50% was on counseling and review of test results   Henreitta Leber, MD Medical Director of Neuro-Oncology [facility] 01/31/24 9:06 AM

## 2024-01-31 NOTE — Progress Notes (Signed)
 [facility] at [facility] [address]  [place], [place] [address] [phone]   Cognitive Survivorship Evaluation  Date of Service: 01/31/24 Patient Name: Chris [name] Patient MRN: [id number] Patient DOB: 1961/04/16 Provider: Henreitta Leber, MD  Identifying Statement:  Chris [name] is a 62 y.o. male who presents for initial consultation and evaluation regarding cancer associated cognitive decline.    Referring Provider: Earna Coder, MD [address] [place],  [place] [address]  Primary Cancer:  Oncologic History: Oncology History Overview Note  DIAGNOSIS: 1) stage IV (T1c, N0, M1 C) non-small cell lung cancer favoring adenocarcinoma presented with right lung apical nodule in addition to metastatic disease in the right hepatic lobe and the proximal right femoral diaphysis diagnosed and December 2022. 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    QNS; Guardant 360 showed positive KRAS G12C mutation  FINAL MICROSCOPIC DIAGNOSIS:   A. LUNG, RUL, FINE NEEDLE ASPIRATION:  - Malignant cells consistent with non-small cell carcinoma, see comment   B. LUNG, RUL, BRUSHING:  - Malignant cells consistent with non-small cell carcinoma, see comment       COMMENT:   A and B.  Dr. Luisa Hart reviewed the case and concurs with the diagnosis.  Only rare malignant cells are present on the cellblock.  Immunohistochemical stains were attempted and show that the tumor cells  have patchy staining for TTF-1 whereas p63, p40 and CK5/6 are negative.  The findings are nondiagnostic but suggestive of a lung adenocarcinoma.  Dr. Delton Coombes was notified on 10/13/2021.   SEP-OCT 2022-  [Dermatology]-   Poorly differentiated squamous cell carcinoma; -  Carcinoma extends to the edges of the excision specimen   IMPRESSION: 1. The spiculated nodule at the right lung apex  on recent neck CT is hypermetabolic and is concerning for primary bronchogenic carcinoma. Tissue sampling recommended. 2. Hypermetabolic activity within the occipital scalp and small previously demonstrated right occipital lymph node compatible with known squamous cell carcinoma. Evaluation of the head and neck limited by motion artifact. 3. Hypermetabolic lesions inferiorly in the right hepatic lobe and in the proximal right femoral diaphysis suspicious for metastatic disease, primary uncertain in this patient with a history of prostate cancer. Correlate with PSA levels. Abdominal MRI without and with contrast may be helpful for further characterization of the liver lesion.  # s/p RT to RUL [[place]]; right hip RT; # BONE METASTASES:  Impending right hip fracture right hip pain-status post radiation [[facility]; DEC 2022]- S/p  Re-RT on 10/03 x5 Fx. NOV 2023-status post intramedullary nail fixation;JUNE 2024 [[place]] Re-surgery-    Awaiting dental clearance for zometa.    # 2007MI-CAD [s/p stent-Dr.Berry; EF 2021- 58%]     Primary adenocarcinoma of upper lobe of right lung (HCC)  10/18/2021 Initial Diagnosis   Primary adenocarcinoma of upper lobe of right lung (HCC)   10/18/2021 Cancer Staging   Staging form: Lung, AJCC 8th Edition - Clinical: Stage IVB (cT1c, cN0, cM1c) - Signed by [name], MD on 10/18/2021   11/01/2021 - 05/30/2022 Chemotherapy   Patient is on Treatment Plan : LUNG NSCLC Pemetrexed + Carboplatin q21d x 4 Cycles     11/01/2021 -  Chemotherapy   Patient is on Treatment Plan : LUNG NSCLC Libtayo q  21d        History of Present Illness: The patient's records from the referring physician were obtained and reviewed and the  patient interviewed to confirm this HPI.  EDMON [name] presents today to discuss cognitive changes since beginning treatment for lung cancer.  He describes modest impairment in attention, processing speed and short term memory.  There is increased  anxiety and mood lability as well. It is more difficult for him to work because of cognitive issues.  Otherwise denies focal complaints, no seizures, headaches.  Medications: Current Outpatient Medications on File Prior to Visit  Medication Sig Dispense Refill   albuterol (VENTOLIN HFA) 108 (90 Base) MCG/ACT inhaler Inhale 2 puffs into the lungs every 6 (six) hours as needed for wheezing or shortness of breath. 6.7 g 6   ALPRAZolam (XANAX) 0.25 MG tablet Take 1 tablet (0.25 mg total) by mouth at bedtime. (Patient taking differently: Take 0.25 mg by mouth at bedtime as needed for anxiety.) 15 tablet 0   atorvastatin (LIPITOR) 20 MG tablet TAKE 1 TABLET BY MOUTH EVERY DAY 90 tablet 3   Budeson-Glycopyrrol-Formoterol (BREZTRI AEROSPHERE) 160-9-4.8 MCG/ACT AERO Inhale 2 puffs into the lungs in the morning and at bedtime. 10.7 g 6   clopidogrel (PLAVIX) 75 MG tablet TAKE 1 TABLET BY MOUTH EVERY DAY 90 tablet 2   fluticasone (FLONASE) 50 MCG/ACT nasal spray Place 1 spray into both nostrils daily as needed for allergies.     ketoconazole (NIZORAL) 2 % cream Apply 1 application  topically 2 (two) times daily as needed for irritation.     meloxicam (MOBIC) 7.5 MG tablet Take 1 tablet (7.5 mg total) by mouth daily. 14 tablet 0   metoprolol tartrate (LOPRESSOR) 25 MG tablet TAKE 1 TABLET (25 MG TOTAL) BY MOUTH DAILY. 90 tablet 1   Nebulizers (COMPRESSOR/NEBULIZER) MISC Use as directed for nebulizer medication. 1 each 0   Oxycodone HCl 10 MG TABS Take 0.5-1 tablets (5-10 mg total) by mouth every 4 (four) hours as needed. 60 tablet 0   pantoprazole (PROTONIX) 40 MG tablet Take 1 tablet (40 mg total) by mouth daily. 60 tablet 1   sennosides-docusate sodium (SENOKOT-S) 8.6-50 MG tablet Take 1 tablet by mouth daily. 60 tablet 1   sertraline (ZOLOFT) 100 MG tablet TAKE 1 TABLET BY MOUTH EVERY DAY 90 tablet 0   tamsulosin (FLOMAX) 0.4 MG CAPS capsule Take 1 capsule (0.4 mg total) by mouth daily. 30 capsule 3    Vitamin D, Ergocalciferol, (DRISDOL) 1.25 MG (50000 UNIT) CAPS capsule TAKE 1 CAPSULE BY MOUTH ONE TIME PER WEEK 12 capsule 1   albuterol (PROVENTIL) (2.5 MG/3ML) 0.083% nebulizer solution Take 3 mLs (2.5 mg total) by nebulization every 6 (six) hours as needed for wheezing or shortness of breath. (Patient not taking: Reported on 01/31/2024) 75 mL 12   sertraline (ZOLOFT) 100 MG tablet Take 1 tablet (100 mg total) by mouth daily. 90 tablet 3   No current facility-administered medications on file prior to visit.    Allergies: No Known Allergies Past Medical History:  Past Medical History:  Diagnosis Date   Anxiety    associated with medical care,  worsened by difficulty hearing, does better with wife present   Arthritis    Cancer associated pain    Cancer, metastatic to liver [facility])    Complication of anesthesia    wife states very anxious may need pre sedation   Coronary artery disease    Dyspnea    GERD (gastroesophageal reflux disease)    Hearing loss    mild per wife   Hearing loss    no hearing aids per wife  Hyperlipidemia    Hypertension    MI (myocardial infarction) (HCC) 04/17/2006   Acute inferolateral wall MI with cardiogenic shock and complete heart block   Primary adenocarcinoma of upper lobe of right lung (HCC)    Prostate cancer (HCC)    Sleep apnea    Tobacco abuse    Wears glasses    Wears partial dentures    Upper   Past Surgical History:  Past Surgical History:  Procedure Laterality Date   BRONCHIAL BIOPSY  10/09/2021   Procedure: BRONCHIAL BIOPSIES;  Surgeon: Leslye Peer, MD;  Location: [facility] ENDOSCOPY;  Service: Pulmonary;;   BRONCHIAL BRUSHINGS  10/09/2021   Procedure: BRONCHIAL BRUSHINGS;  Surgeon: Leslye Peer, MD;  Location: [facility] ENDOSCOPY;  Service: Pulmonary;;   BRONCHIAL NEEDLE ASPIRATION BIOPSY  10/09/2021   Procedure: BRONCHIAL NEEDLE ASPIRATION BIOPSIES;  Surgeon: Leslye Peer, MD;  Location: [facility] ENDOSCOPY;  Service: Pulmonary;;   CARDIAC  CATHETERIZATION  2007   left, RCA 100% occluded ruptured plaque with thrombus in the proximal segment   CYST EXCISION N/A 08/30/2021   Procedure: EXCISION OF POSTERIOR SCALP CYST;  Surgeon: Berna Bue, MD;  Location: [facility] ORS;  Service: General;  Laterality: N/A;   CYSTOSCOPY N/A 07/17/2018   Procedure: Bluford Kaufmann;  Surgeon: Marcine Matar, MD;  [facility];  Service: Urology;  Laterality: N/A;  no seeds in bladder per Dr Retta Diones   FEMUR IM NAIL Right 04/03/2023   Procedure: REVISION FIXATION OF RIGHT FEMUR NONUNION;  Surgeon: Roby Lofts, MD;  Location: [facility] OR;  Service: Orthopedics;  Laterality: Right;   FIDUCIAL MARKER PLACEMENT  10/09/2021   Procedure: FIDUCIAL MARKER PLACEMENT;  Surgeon: Leslye Peer, MD;  Location: [facility] ENDOSCOPY;  Service: Pulmonary;;   HAND SURGERY Right    has metal plate in arm   HARDWARE REMOVAL Right 04/03/2023   Procedure: REMOVAL OF PREVIOUS HARDWARE FEMUR;  Surgeon: Roby Lofts, MD;  Location: [facility] OR;  Service: Orthopedics;  Laterality: Right;   PROSTATE BIOPSY     RADIOACTIVE SEED IMPLANT N/A 07/17/2018   Procedure: RADIOACTIVE SEED IMPLANT/BRACHYTHERAPY IMPLANT;  Surgeon: Marcine Matar, MD;  Location[facility];  Service: Urology;  Laterality: N/A;  77 seeds   SPACE OAR INSTILLATION N/A 07/17/2018   Procedure: SPACE OAR INSTILLATION;  Surgeon: Marcine Matar, MD;  Location[facility];  Service: Urology;  Laterality: N/A;   VIDEO BRONCHOSCOPY WITH RADIAL ENDOBRONCHIAL ULTRASOUND  10/09/2021   Procedure: VIDEO BRONCHOSCOPY WITH RADIAL ENDOBRONCHIAL ULTRASOUND;  Surgeon: Leslye Peer, MD;  Location: [facility] ENDOSCOPY;  Service: Pulmonary;;   WRIST SURGERY  10/04/2012   Social History:  Social History   Socioeconomic History   Marital status: Married    Spouse name: Not on file   Number of children: Not on file   Years of education: Not on file   Highest education level: Not on  file  Occupational History   Not on file  Tobacco Use   Smoking status: Every Day    Current packs/day: 0.75    Average packs/day: 0.8 packs/day for 42.0 years (31.5 ttl pk-yrs)    Types: Cigarettes   Smokeless tobacco: Never   Tobacco comments:    Smokes 0.75-1 PPD - khj 12/31/2023    Started smoking at 63 years old    Smoked 2 PPD at his heaviest.   Vaping Use   Vaping status: Never Used  Substance and Sexual Activity   Alcohol use: No   Drug  use: No   Sexual activity: Yes    Birth control/protection: None  Other Topics Concern   Not on file  Social History Narrative   10-04 19 Unable to ask abuse questions wife with him today.   Are you right handed or left handed? Ambidextrous prominent right   Are you currently employed ? 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 no   Who lives with you? Wife and patient   What type of home do you live in: 1 story or 2 story? 1 story       Social Drivers of Corporate investment banker Strain: Not on file  Food Insecurity: No Food Insecurity (04/03/2023)   Hunger Vital Sign    Worried About Running Out of Food in the Last Year: Never true    Ran Out of Food in the Last Year: Never true  Transportation Needs: No Transportation Needs (04/03/2023)   PRAPARE - Administrator, Civil Service (Medical): No    Lack of Transportation (Non-Medical): No  Physical Activity: Not on file  Stress: Not on file  Social Connections: Not on file  Intimate Partner Violence: Not At Risk (04/03/2023)   Humiliation, Afraid, Rape, and Kick questionnaire    Fear of Current or Ex-Partner: No    Emotionally Abused: No    Physically Abused: No    Sexually Abused: No   Family History:  Family History  Problem Relation Age of Onset   Breast cancer Mother    Prostate cancer Neg Hx    Kidney cancer Neg Hx    Cancer Neg Hx     Review of Systems: Constitutional: Doesn't report fevers, chills or abnormal  weight loss Eyes: Doesn't report blurriness of vision Ears, nose, mouth, throat, and face: Doesn't report sore throat Respiratory: Doesn't report cough, dyspnea or wheezes Cardiovascular: Doesn't report palpitation, chest discomfort  Gastrointestinal:  Doesn't report nausea, constipation, diarrhea GU: Doesn't report incontinence Skin: Doesn't report skin rashes Neurological: Per HPI Musculoskeletal: Doesn't report joint pain Behavioral/Psych: +anxiety  Physical Exam: Vitals:   01/31/24 0916  BP: (!) 147/75  Pulse: 71  Resp: 20  Temp: 98.6 F (37 C)  SpO2: 100%   General: Alert, cooperative, pleasant, in no acute distress Head: Normal EENT: No conjunctival injection or scleral icterus.  Lungs: Resp effort normal Cardiac: Regular rate Abdomen: Non-distended abdomen Skin: No rashes cyanosis or petechiae. Extremities: No clubbing or edema  Neurologic Exam: Mental Status: Awake, alert, attentive to examiner. Oriented to self and environment. Language is fluent with intact comprehension.  Cranial Nerves: Visual acuity is grossly normal. Visual fields are full. Extra-ocular movements intact. No ptosis. Face is symmetric Motor: Tone and bulk are normal. Power is full in both arms and legs.  Sensory: Intact to light touch Gait: Normal.   Labs: I have reviewed the data as listed    Component Value Date/Time   NA 138 12/27/2023 0824   K 4.0 12/27/2023 0824   CL 105 12/27/2023 0824   CO2 23 12/27/2023 0824   GLUCOSE 105 (H) 12/27/2023 0824   BUN 20 12/27/2023 0824   CREATININE 0.97 12/27/2023 0824   CREATININE 1.07 08/27/2023 1331   CALCIUM 9.0 12/27/2023 0824   PROT 7.6 12/27/2023 0824   ALBUMIN 4.2 12/27/2023 0824   AST 19 12/27/2023 0824   AST 22 08/27/2023 1331   ALT 23 12/27/2023 0824   ALT 20 08/27/2023 1331  ALKPHOS 75 12/27/2023 0824   BILITOT 0.6 12/27/2023 0824   BILITOT 0.6 08/27/2023 1331   GFRNONAA >60 12/27/2023 0824   GFRNONAA >60 08/27/2023 1331    GFRAA >60 07/10/2018 1356   Lab Results  Component Value Date   WBC 6.3 12/27/2023   NEUTROABS 4.1 12/27/2023   HGB 15.5 12/27/2023   HCT 46.1 12/27/2023   MCV 88.1 12/27/2023   PLT 213 12/27/2023     Assessment/Plan:  Cognitive Changes  ILIJA MAXIM presents with clinical syndrome consistent with mild cognitive decline secondary to downstream effects of cancer and chemotherapy.  In addition, there is chronic stress and inflammation associated with his orthopedic issues with the left femur.   MRI brain demonstrated stable findings, no age advanced changes or atrophy.  We provided counseling regarding healthy behaviors to maintain cognitive function, including exercise, diet, and positive outlook.  We discussed mindful relaxation and provided some methods to redirect anxiety without medication.   No further CNS workup or cognitive screening recommended at this time.  We spent twenty additional minutes teaching regarding the natural history, biology, and historical experience in the treatment of neurologic complications of cancer.   We appreciate the opportunity to participate in the care of NIMA KEMPPAINEN.  We encouraged follow up for progressive cognitive issues or neurologic symptoms if they develop.  All questions were answered. The patient knows to call the clinic with any problems, questions or concerns. No barriers to learning were detected.  The total time spent in the encounter was 40 minutes and more than 50% was on counseling and review of test results   Henreitta Leber, MD Medical Director of Neuro-Oncology [facility] 01/31/24 9:59 AM

## 2024-02-06 ENCOUNTER — Ambulatory Visit: Admitting: Pulmonary Disease

## 2024-02-07 ENCOUNTER — Telehealth: Payer: Self-pay

## 2024-02-07 NOTE — Telephone Encounter (Signed)
   Pre-operative Risk Assessment    Patient Name: Chris [name]  DOB: 07/03/1961 MRN: [id number]   Date of last office visit: 12/05/22 Nanetta Batty, MD Date of next office visit: NONE   Request for Surgical Clearance    Procedure:   RT HIP NAIL CONVERSION TO TOTAL HIP ARTHROPLASTY  Date of Surgery:  Clearance TBD                                Surgeon:  Weber Cooks, MD Surgeon's Group or Practice Name:  [facility] Phone number:  [phone]  EXT [phone] Fax number:  [phone]   Type of Clearance Requested:   - Medical  - Pharmacy:  Hold Clopidogrel (Plavix)     Type of Anesthesia:  Spinal   Additional requests/questions:    SignedMarlow Baars   02/07/2024, 4:37 PM

## 2024-02-07 NOTE — Telephone Encounter (Signed)
 S/w the pt and his wife, who agree to appt in office but only with Dr. Allyson Sabal per pt request. Scheduled for 03/03/24, though would like to be seen sooner as he is in pain as he broke his femur again. I assured the pt and wife that I will send a message to Tonye Pearson, Dr, Hazle Coca nurse to see if she can move appt up sooner. Pt and his wife thanked me for the help and the call.

## 2024-02-07 NOTE — Telephone Encounter (Signed)
   Name: Chris [name]  DOB: [birth year]  MRN: [id number]  Primary Cardiologist: Nanetta Batty, MD  Chart reviewed as part of pre-operative protocol coverage. Because of Chris [name]'s past medical history and time since last visit, he will require a follow-up in-office visit in order to better assess preoperative cardiovascular risk.  Pre-op covering staff: - Please schedule appointment and call patient to inform them. If patient already had an upcoming appointment within acceptable timeframe, please add "pre-op clearance" to the appointment notes so provider is aware. - Please contact requesting surgeon's office via preferred method (i.e, phone, fax) to inform them of need for appointment prior to surgery.  PCI 2007. Can hold plavix with ASA monotherapy.   Roe Rutherford Sim Choquette, PA  02/07/2024, 4:58 PM

## 2024-02-10 ENCOUNTER — Encounter: Payer: Self-pay | Admitting: Cardiovascular Disease

## 2024-02-10 ENCOUNTER — Encounter: Payer: Self-pay | Admitting: Pulmonary Disease

## 2024-02-10 ENCOUNTER — Encounter: Payer: Self-pay | Admitting: Internal Medicine

## 2024-02-10 NOTE — Telephone Encounter (Signed)
 Have you seen a clearance come through on this patient?

## 2024-02-10 NOTE — Progress Notes (Unsigned)
 Cardiology Office Note    Date:  02/11/2024  ID:  Chris [name], DOB Aug 25, 1961, MRN [id number] PCP:  Annelle Kiel, MD  Cardiologist:  Lauro Portal, MD  Electrophysiologist:  None   Chief Complaint: Preoperative cardiac evaluation   History of Present Illness: .    Chris [name] is a 63 y.o. male with visit-pertinent history of CAD s/p inferior MI in 2007 s/p DES to RCA, ICM EF 40 to 45%, hyperlipidemia, tobacco abuse.  EF improved to 50 to 55% by last echo in 2007.  Patient also has history of stage IV non-small cell lung cancer favoring adenocarcinoma that presented with right lung apical nodule in addition to metastatic disease in the right hepatic lobe and proximal right femoral diaphysis diagnosed in December 2022.  Patient underwent chemotherapy, radiation therapy and immunotherapy.  Patient apparently had fracture of his right femur related to possible metastasis and radiation therapy requiring orthopedic surgery at [facility] in 2022.  Patient presented with acute inferior wall myocardial infarction with RV infarct physiology in June 2007, patient underwent cardiac catheterization with placement of overlapping Cypher DES to the occluded RCA.  The circumflex and LAD were free of significant disease.  EF at that time was 40 to 45% with moderate inferior hypokinesia.  Subsequent echocardiogram revealed normal LV function with borderline inferior hypokinesia and MPI performed was not ischemic.  Patient was last seen in clinic by Dr. Katheryne Pane on 12/05/2022, he remained stable from a cardiac standpoint.  Today he presents for preoperative cardiac evaluation. He reports that he has been doing well from a cardiac standpoint.  He denies any chest pain, worsening shortness of breath, patient notes that he does have some baseline DOE related to lung cancer, denies any recent significant changes.  He denies any palpitations, presyncope, syncope, lower extremity edema, orthopnea or PND.  Patient  notes that in the last 3 weeks he has had significant right leg pain related to femoral fracture, prior to this he was able to achieve greater than 4 METS of activity doing moderate level activities around the house, on his cars and with doing short walks.  Patient is currently planning to undergo hip replacement with hopes that his right leg pain will improve.  Labwork independently reviewed: 12/27/2023: Sodium 138, potassium 4.0, creatinine 0.97, AST 19, ALT 23, hemoglobin 15.5, hematocrit 46.1 ROS: .   Today he denies chest pain, lower extremity edema, fatigue, palpitations, melena, hematuria, hemoptysis, diaphoresis, weakness, presyncope, syncope, orthopnea, and PND.  All other systems are reviewed and otherwise negative. Studies Reviewed: Aaron Aas   EKG:  EKG is ordered today, personally reviewed, demonstrating  EKG Interpretation Date/Time:  Tuesday February 11 2024 08:54:06 EDT Ventricular Rate:  69 PR Interval:  174 QRS Duration:  142 QT Interval:  438 QTC Calculation: 469 R Axis:   47  Text Interpretation: Sinus rhythm with Premature supraventricular complexes Right bundle branch block When compared with ECG of 12-Sep-2022 14:00, Premature supraventricular complexes are now Present Criteria for Lateral infarct are no longer Present Confirmed by Jacey Pelc [phone]) on 02/11/2024 9:27:09 AM   CV Studies: Cardiac studies reviewed are outlined and summarized above. Otherwise please see EMR for full report. Current Reported Medications:.    No outpatient medications have been marked as taking for the 02/11/24 encounter (Office Visit) with Jaskiran Pata D, NP.    Physical Exam:    VS:  BP 128/78   Pulse 69   Ht 5\' 11"  (1.803 m)   Wt 248 lb (  112.5 kg)   SpO2 97%   BMI 34.59 kg/m    Wt Readings from Last 3 Encounters:  02/11/24 248 lb (112.5 kg)  01/31/24 247 lb 11.2 oz (112.4 kg)  12/31/23 250 lb 9.6 oz (113.7 kg)    GEN: Well nourished, well developed in no acute distress NECK: No JVD;  No carotid bruits CARDIAC: RRR, no murmurs, rubs, gallops RESPIRATORY:  Clear to auscultation without rales, wheezing or rhonchi  ABDOMEN: Soft, non-tender, non-distended EXTREMITIES:  No edema; No acute deformity     Asessement and Plan:.    Coronary artery disease: Patient with history of CAD s/p acute inferior wall myocardial infarction with RV physiology in 03/2006.  Had placement of overlapping Cypher DES to the occluded RCA.  At that time his circumflex and LAD were free of disease.  EF was 40 to 45% with moderate inferior hypokinesia, subsequent echo showed normal LV function. Stable with no anginal symptoms. No indication for ischemic evaluation.  Heart healthy diet and regular cardiovascular exercise encouraged.  Continue Plavix 75 mg daily, atorvastatin 20 mg daily, Toprol tartrate 25 mg twice daily.  Reviewed ED precautions. Plavix and metoprolol refills provided.   Hyperlipidemia: Patient without a recent lipid profile on file.  Check fasting lipid profile today.  Continue atorvastatin pending lab work.  Tobacco abuse: Patient reports that he is currently smoking half a pack of cigarettes a day.  Complete cessation encouraged.  Preoperative cardiac evaluation: Right hip nail conversion to total hip arthroplasty with Dr. Priscille Brought.  Mr. Mcshan perioperative risk of a major cardiac event is 0.9% according to the Revised Cardiac Risk Index (RCRI). He is at moderate risk for perioperative complications.   His functional capacity is fair at 4.4 METs according to the Duke Activity Status Index (DASI). Recommendations:According to ACC/AHA guidelines, no further cardiovascular testing needed.  The patient may proceed to surgery at acceptable risk.  Antiplatelet and/or Anticoagulation Recommendations: Clopidogrel (Plavix) can be held for 5 days prior to his surgery and resumed as soon as possible post op.  Ideally patient would start aspirin 81 mg daily while Plavix is being held and  continued through the perioperative period, however if bleeding risk is felt to be too high aspirin can be held.  If patient is started on aspirin this can be discontinued when Plavix is resumed.    Disposition: F/u with Dr. Katheryne Pane in six months or sooner if needed.   Signed, Elizeo Rodriques D Brendon Christoffel, NP

## 2024-02-11 ENCOUNTER — Ambulatory Visit: Attending: Cardiology | Admitting: Cardiology

## 2024-02-11 ENCOUNTER — Encounter: Payer: Self-pay | Admitting: Cardiology

## 2024-02-11 VITALS — BP 128/78 | HR 69 | Ht 71.0 in | Wt 248.0 lb

## 2024-02-11 DIAGNOSIS — Z720 Tobacco use: Secondary | ICD-10-CM

## 2024-02-11 DIAGNOSIS — I2510 Atherosclerotic heart disease of native coronary artery without angina pectoris: Secondary | ICD-10-CM | POA: Diagnosis not present

## 2024-02-11 DIAGNOSIS — E782 Mixed hyperlipidemia: Secondary | ICD-10-CM

## 2024-02-11 DIAGNOSIS — Z01810 Encounter for preprocedural cardiovascular examination: Secondary | ICD-10-CM

## 2024-02-11 LAB — LIPID PANEL
Chol/HDL Ratio: 4 ratio (ref 0.0–5.0)
Cholesterol, Total: 144 mg/dL (ref 100–199)
HDL: 36 mg/dL — ABNORMAL LOW (ref >39)
LDL Chol Calc (NIH): 77 mg/dL (ref 0–99)
Triglycerides: 184 mg/dL — ABNORMAL HIGH (ref 0–149)
VLDL Cholesterol Cal: 31 mg/dL (ref 5–40)

## 2024-02-11 MED ORDER — METOPROLOL TARTRATE 25 MG PO TABS: 25.0000 mg | ORAL_TABLET | Freq: Every day | ORAL | 3 refills | Status: AC

## 2024-02-11 MED ORDER — CLOPIDOGREL BISULFATE 75 MG PO TABS: 75.0000 mg | ORAL_TABLET | Freq: Every day | ORAL | 3 refills | Status: DC

## 2024-02-11 NOTE — Telephone Encounter (Addendum)
 Per Lovett Ruck, she spoke with the patient and scheduled him an appt for 4/16.  Nothing further needed.

## 2024-02-11 NOTE — Patient Instructions (Addendum)
 Medication Instructions:  NO CHANGES   Lab Work: FASTING LIPID PANEL TO BE DONE TODAY   Testing/Procedures: NONE  Follow-Up: At [facility], you and your health needs are our priority.  As part of our continuing mission to provide you with exceptional heart care, our providers are all part of one team.  This team includes your primary Cardiologist (physician) and Advanced Practice Providers or APPs (Physician Assistants and Nurse Practitioners) who all work together to provide you with the care you need, when you need it.  Your next appointment:   6 MONTHS  Provider:   Lauro Portal, MD      Other Instructions:      1st Floor: - Lobby - Registration  - Pharmacy  - Lab - Cafe  2nd Floor: - PV Lab - Diagnostic Testing (echo, CT, nuclear med)  3rd Floor: - Vacant  4th Floor: - TCTS (cardiothoracic surgery) - AFib Clinic - Structural Heart Clinic - Vascular Surgery  - Vascular Ultrasound  5th Floor: - [facility] (general and EP) - Clinical Pharmacy for coumadin, hypertension, lipid, weight-loss medications, and med management appointments    Valet parking services will be available as well.

## 2024-02-12 ENCOUNTER — Encounter: Payer: Self-pay | Admitting: Pulmonary Disease

## 2024-02-12 ENCOUNTER — Ambulatory Visit (INDEPENDENT_AMBULATORY_CARE_PROVIDER_SITE_OTHER): Admitting: Pulmonary Disease

## 2024-02-12 VITALS — BP 110/72 | HR 68 | Temp 98.2°F | Ht 71.0 in | Wt 248.0 lb

## 2024-02-12 DIAGNOSIS — J439 Emphysema, unspecified: Secondary | ICD-10-CM

## 2024-02-12 DIAGNOSIS — J4489 Other specified chronic obstructive pulmonary disease: Secondary | ICD-10-CM | POA: Diagnosis not present

## 2024-02-12 DIAGNOSIS — C3411 Malignant neoplasm of upper lobe, right bronchus or lung: Secondary | ICD-10-CM | POA: Diagnosis not present

## 2024-02-12 DIAGNOSIS — Z01811 Encounter for preprocedural respiratory examination: Secondary | ICD-10-CM | POA: Diagnosis not present

## 2024-02-12 NOTE — Progress Notes (Signed)
 Subjective:    Patient ID: Chris [name], male    DOB: 10/12/1961, 63 y.o.   MRN: [id number]  Patient Care Team: Sherrie Mustache, MD as PCP - General (Internal Medicine) Runell Gess, MD as PCP - Cardiology (Cardiology) [name], MD as Consulting Physician (Oncology) Sherrie Mustache, MD as Referring Physician (Internal Medicine) Earna Coder, MD as Consulting Physician (Internal Medicine)  Chief Complaint  Patient presents with   Follow-up    Pre-op clearance: looking to get his femur ball socket replaced that was damaged from radiation. No coughing, wheezing, SOB, as of right now. On an inhaler in the morning -- albuterol.    BACKGROUND/INTERVAL: Chris [name] is a 63 y.o. current smoker, with metastatic stage IV non-small cell lung cancer/favor adenocarcinoma, who follows here for management of COPD.  Previously followed by Drs. Byrum and Icard.  I first saw him on 31 December 2023.  At that time he had an exacerbation.  He presents today for preoperative evaluation as he is requiring replacement of an intramedullary nail on the right femur that was previously placed on 18 September 2022.  It appears that this is fractured.  HPI Discussed the use of AI scribe software for clinical note transcription with the patient, who gave verbal consent to proceed.  History of Present Illness   Chris [name] "Chris [name]" is a 63 year old male who presents for clearance for surgery.  He has a broken intramedullary nail on his right and is unable to walk or stand, describing the damage as 'terrible' after seeing radiographs of it.  He mentions that past radiation treatment has likely affected this, although he acknowledges its necessity at the time.  He is doing better with his breathing and uses a Breztri inhaler twice a day. He also has a stronger emergency inhaler that he does not use often. No current breathing problems are reported.  He has a history of sleep apnea but is  unable to use the CPAP machine.  During his prior visit here on 31 December 2023 he was noted to be in acute exacerbation.  He has not had any issues after that visit.  He was ordered PFTs but these have not been done yet.  He has not had any previous PFTs.    Review of Systems A 10 point review of systems was performed and it is as noted above otherwise negative.   Patient Active Problem List   Diagnosis Date Noted   Cognitive changes 01/31/2024   Displaced intertrochanteric fracture of right femur, subsequent encounter for closed fracture with nonunion 04/03/2023   Subtrochanteric fracture of right femur, closed, with nonunion, subsequent encounter 03/21/2023   Abnormal liver CT 01/07/2023   Primary adenocarcinoma of upper lobe of right lung (HCC) 10/18/2021   Encounter for antineoplastic chemotherapy 10/18/2021   Encounter for antineoplastic immunotherapy 10/18/2021   Right upper lobe pulmonary nodule 09/27/2021   Squamous cell carcinoma of scalp 09/13/2021   Tobacco abuse 04/08/2018   Malignant neoplasm of prostate (HCC) 03/25/2018   Malignant neoplasm of prostate metastatic to bone (HCC) 03/25/2018   Coronary artery disease 10/27/2013   Hyperlipidemia 10/27/2013    Social History   Tobacco Use   Smoking status: Every Day    Current packs/day: 0.75    Average packs/day: 0.8 packs/day for 42.0 years (31.5 ttl pk-yrs)    Types: Cigarettes   Smokeless tobacco: Never   Tobacco comments:    Smokes 0.25 PPD - [facility] - 02/12/24  Started smoking at 63 years old    Smoked 2 PPD at his heaviest.   Substance Use Topics   Alcohol use: No    Allergies  Allergen Reactions   Pollen Extract Cough    Current Meds  Medication Sig   albuterol (VENTOLIN HFA) 108 (90 Base) MCG/ACT inhaler Inhale 2 puffs into the lungs every 6 (six) hours as needed for wheezing or shortness of breath.   ALPRAZolam (XANAX) 0.25 MG tablet Take 1 tablet (0.25 mg total) by mouth at bedtime. (Patient taking  differently: Take 0.25 mg by mouth at bedtime as needed for anxiety.)   atorvastatin (LIPITOR) 20 MG tablet TAKE 1 TABLET BY MOUTH EVERY DAY   Budeson-Glycopyrrol-Formoterol (BREZTRI AEROSPHERE) 160-9-4.8 MCG/ACT AERO Inhale 2 puffs into the lungs in the morning and at bedtime.   clopidogrel (PLAVIX) 75 MG tablet Take 1 tablet (75 mg total) by mouth daily.   fluticasone (FLONASE) 50 MCG/ACT nasal spray Place 1 spray into both nostrils daily as needed for allergies.   ketoconazole (NIZORAL) 2 % cream Apply 1 application  topically 2 (two) times daily as needed for irritation.   meloxicam (MOBIC) 7.5 MG tablet Take 1 tablet (7.5 mg total) by mouth daily.   metoprolol tartrate (LOPRESSOR) 25 MG tablet Take 1 tablet (25 mg total) by mouth daily.   Nebulizers (COMPRESSOR/NEBULIZER) MISC Use as directed for nebulizer medication.   Oxycodone HCl 10 MG TABS Take 0.5-1 tablets (5-10 mg total) by mouth every 4 (four) hours as needed.   pantoprazole (PROTONIX) 40 MG tablet Take 1 tablet (40 mg total) by mouth daily.   sennosides-docusate sodium (SENOKOT-S) 8.6-50 MG tablet Take 1 tablet by mouth daily.   sertraline (ZOLOFT) 100 MG tablet TAKE 1 TABLET BY MOUTH EVERY DAY   tamsulosin (FLOMAX) 0.4 MG CAPS capsule Take 1 capsule (0.4 mg total) by mouth daily.   Vitamin D, Ergocalciferol, (DRISDOL) 1.25 MG (50000 UNIT) CAPS capsule TAKE 1 CAPSULE BY MOUTH ONE TIME PER WEEK    Immunization History  Administered Date(s) Administered   PFIZER(Purple Top)SARS-COV-2 Vaccination 06/11/2020, 07/02/2020        Objective:     BP 110/72 (BP Location: Right Arm, Patient Position: Sitting, Cuff Size: Large)   Pulse 68   Temp 98.2 F (36.8 C) (Oral)   Ht 5\' 11"  (1.803 m)   Wt 248 lb (112.5 kg)   SpO2 99%   BMI 34.59 kg/m   SpO2: 99 %  GENERAL: Well-developed, well-nourished gentleman, no acute distress.  Presents in transport chair.  No conversational dyspnea. HEAD: Normocephalic, atraumatic.  EYES:  Pupils equal, round, reactive to light.  No scleral icterus.  MOUTH: Upper dentures, poor dentition lower, oral mucosa moist. NECK: Supple. No thyromegaly. Trachea midline. No JVD.  No adenopathy. PULMONARY: Good air entry bilaterally.  No adventitious sounds noted today. CARDIOVASCULAR: S1 and S2. Regular rate and rhythm.  ABDOMEN: Benign. MUSCULOSKELETAL: No joint deformity, no clubbing, no edema.  NEUROLOGIC: No overt focal deficit. SKIN: Intact,warm,dry. PSYCH: Mood and behavior normal.   ARISCAT score: 19 points, 1.6% risk of in-hospital postop pulmonary complications   Assessment & Plan:     ICD-10-CM   1. COPD with chronic bronchitis and emphysema (HCC)  J44.89    J43.9     2. Primary adenocarcinoma of upper lobe of right lung (HCC)  C34.11     3. Preoperative respiratory examination  Z01.811      Discussion:    Chronic Obstructive Pulmonary Disease (COPD) He reports improved  breathing and uses Breztri inhaler twice daily. He possesses an emergency inhaler but does not utilize it. Lung examination was normal. - Continue Breztri inhaler twice daily. - Check on status of PFTs  COPD exacerbation - resolved/preoperative risk assessment Previous COPD exacerbation issues have resolved, no recent exacerbation. - Risk for surgery is deemed to be low at 1.6% risk of in-hospital postop pulmonary complications.  Environmental allergies He experiences congestion in certain environments, such as the examination room, indicating possible environmental allergies.  Sleep apnea He is unable to tolerate CPAP therapy for sleep apnea. - This will need to be taken into consideration postop  Hip fracture He has a fracture intramedullary nail requiring surgical intervention. He is unable to walk or stand due to the severity of the fracture. He is scheduled for hip replacement surgery involving a ball and socket prosthesis.      Advised if symptoms do not improve or worsen, to please  contact office for sooner follow up or seek emergency care.    I spent 33 minutes of dedicated to the care of this patient on the date of this encounter to include pre-visit review of records, face-to-face time with the patient discussing conditions above, post visit ordering of testing, clinical documentation with the electronic health record, making appropriate referrals as documented, and communicating necessary findings to members of the patients care team.     C. Chloe Counter, MD Advanced Bronchoscopy PCCM [facility]    *This note was generated using voice recognition software/Dragon and/or AI transcription program.  Despite best efforts to proofread, errors can occur which can change the meaning. Any transcriptional errors that result from this process are unintentional and may not be fully corrected at the time of dictation.

## 2024-02-12 NOTE — Patient Instructions (Signed)
 VISIT SUMMARY:  You came in today for clearance for your upcoming hip replacement surgery. We discussed your breathing, heart health, and other concerns to ensure you are ready for the procedure.  YOUR PLAN:  -CHRONIC OBSTRUCTIVE PULMONARY DISEASE (COPD): COPD is a chronic lung condition that makes it hard to breathe. You reported improved breathing and are using your Breztri inhaler twice daily. Continue using the Breztri inhaler as prescribed.  -CONGESTION: Your previous congestion issues have resolved, and you are cleared for surgery.  -ENVIRONMENTAL ALLERGIES: You experience congestion in certain environments, which may indicate environmental allergies. Avoiding known triggers can help manage this.  -SLEEP APNEA: Sleep apnea is a condition where breathing repeatedly stops and starts during sleep. You are unable to tolerate CPAP therapy.  -HIP FRACTURE: You have a severe hip fracture that requires surgical intervention. You are scheduled for hip replacement surgery, which will involve a ball and socket prosthesis.  INSTRUCTIONS:  Please continue using your Breztri inhaler twice daily. Avoid environments that trigger your allergies. Follow up with your surgeon as scheduled for your hip replacement surgery.  Please quit smoking altogether.

## 2024-02-14 ENCOUNTER — Telehealth: Payer: Self-pay

## 2024-02-14 DIAGNOSIS — I2510 Atherosclerotic heart disease of native coronary artery without angina pectoris: Secondary | ICD-10-CM

## 2024-02-14 DIAGNOSIS — E782 Mixed hyperlipidemia: Secondary | ICD-10-CM

## 2024-02-14 MED ORDER — ATORVASTATIN CALCIUM 40 MG PO TABS: 40.0000 mg | ORAL_TABLET | Freq: Every day | ORAL | 3 refills | Status: AC

## 2024-02-14 NOTE — Telephone Encounter (Signed)
 Left message to call back

## 2024-02-14 NOTE — Telephone Encounter (Signed)
 Pt returning call

## 2024-02-14 NOTE — Telephone Encounter (Signed)
-----   Message from Katlyn D West sent at 02/12/2024  7:50 AM EDT ----- Please let Mr. Mestre know that his LDL or bad cholesterol is not at goal, his triglycerides are also elevated. Recommend increasing his atorvastatin  to 40 mg daily. He will need a recheck of fasting lipid profile and Lfts in 6-8 weeks.

## 2024-02-14 NOTE — Telephone Encounter (Signed)
 Chris D West, NP 02/12/2024  7:50 AM EDT     Please let Chris [name] know that his LDL or bad cholesterol is not at goal, his triglycerides are also elevated. Recommend increasing his atorvastatin  to 40 mg daily. He will need a recheck of fasting lipid profile and Lfts in 6-8 weeks.   Spoke with pt's wife, Chris [name] (ok per [facility]) regarding pt's results of lab work. New prescription sent to pt's preferred pharmacy, advised that pt can double up on 10mg  tablets, until supply is complete. Lab orders placed. Pt will plan to get these done in 6-8 weeks. Wife has no further questions at this time.

## 2024-02-14 NOTE — Telephone Encounter (Signed)
 Pt is returning call.

## 2024-02-18 ENCOUNTER — Telehealth: Payer: Self-pay | Admitting: Cardiovascular Disease

## 2024-02-18 ENCOUNTER — Telehealth: Payer: Self-pay | Admitting: Cardiology

## 2024-02-18 NOTE — Telephone Encounter (Signed)
 Called Dr. Harding Li back regarding pt needing cardiac clearance. Spoke with Crystal. Left message for Dr. Jadali, stating that pt was seen in our office on 02/11/24 by Katlyn West, NP for cardiac clearance.   Preoperative cardiac evaluation: Right hip nail conversion to total hip arthroplasty with Dr. Priscille Brought.  Chris [name] perioperative risk of a major cardiac event is 0.9% according to the Revised Cardiac Risk Index (RCRI). He is at moderate risk for perioperative complications.   His functional capacity is fair at 4.4 METs according to the Duke Activity Status Index (DASI). Recommendations:According to ACC/AHA guidelines, no further cardiovascular testing needed.  The patient may proceed to surgery at acceptable risk.  Antiplatelet and/or Anticoagulation Recommendations: Clopidogrel  (Plavix ) can be held for 5 days prior to his surgery and resumed as soon as possible post op.  Ideally patient would start aspirin  81 mg daily while Plavix  is being held and continued through the perioperative period, however if bleeding risk is felt to be too high aspirin  can be held.  If patient is started on aspirin  this can be discontinued when Plavix  is resumed.    Katlyn's recommendations given as part of message left for Dr. Jadali. Call back number left for further questions.

## 2024-02-18 NOTE — Telephone Encounter (Signed)
 Spoke with pt's wife, Nellie Banas (ok per [facility]) regarding cardiac clearance. Wife just spoke with Dr. Harding Li office and was told that pt would need to be cleared from a cardiac perspective before proceeding with hip surgery. Dr. Katheryne Pane admits that he over looked an office visit where pt was seen by Katlyn West, NP and did clear pt for surgery. Based on this Dr. Katheryne Pane is comfortable with pt proceeding with surgery. Wife is aware. She asks that I give a call back to Dr. Harding Li office and let him know of the oversight. Will give Dr. Harding Li office a call.

## 2024-02-18 NOTE — Telephone Encounter (Signed)
 Dr. Katheryne Pane was able to speak with Dr. Jadali. Last office visit with Dr. Katheryne Pane was 12/05/22. Based on this Dr. Katheryne Pane tells Dr. Jadali that pt will need to be cleared from a cardiac standpoint for hip surgery.

## 2024-02-18 NOTE — Telephone Encounter (Signed)
 Patient's PCP called to speak with Dr. Katheryne Pane. Please advise

## 2024-02-18 NOTE — Telephone Encounter (Signed)
 Called Dr. Harding Li office, message left to make them aware of cardiac clearance completed on 02/11/24. Please see additional telephone encounter for more information.

## 2024-02-18 NOTE — Telephone Encounter (Signed)
 New Message:      Wife is calling to see what Dr Katheryne Pane told patient's primary doctor about his surgery.

## 2024-02-18 NOTE — Telephone Encounter (Signed)
 Spoke to patient's wife she stated she already spoke to Dr.Berry's RN.Stated everything taken care of.

## 2024-02-19 ENCOUNTER — Encounter: Payer: Self-pay | Admitting: Cardiovascular Disease

## 2024-02-19 ENCOUNTER — Encounter: Payer: Self-pay | Admitting: Internal Medicine

## 2024-02-20 ENCOUNTER — Other Ambulatory Visit: Payer: Self-pay | Admitting: Hospice and Palliative Medicine

## 2024-02-20 ENCOUNTER — Encounter: Payer: Self-pay | Admitting: Internal Medicine

## 2024-02-20 ENCOUNTER — Ambulatory Visit: Payer: Self-pay | Admitting: Emergency Medicine

## 2024-02-20 ENCOUNTER — Encounter: Payer: Self-pay | Admitting: Hospice and Palliative Medicine

## 2024-02-20 ENCOUNTER — Telehealth: Payer: Self-pay | Admitting: *Deleted

## 2024-02-20 DIAGNOSIS — M84451K Pathological fracture, right femur, subsequent encounter for fracture with nonunion: Secondary | ICD-10-CM

## 2024-02-20 DIAGNOSIS — G8929 Other chronic pain: Secondary | ICD-10-CM

## 2024-02-20 MED ORDER — OXYCODONE HCL 10 MG PO TABS
5.0000 mg | ORAL_TABLET | ORAL | 0 refills | Status: DC | PRN
Start: 2024-02-20 — End: 2024-04-28

## 2024-02-20 NOTE — Telephone Encounter (Signed)
 I spoke to patient PCP Dr.Jadali-recommend from oncology standpoint to proceed with surgery.  Reviewed the labs-with wife.  Recommend continued surgery for quality of life issues.  GB

## 2024-02-20 NOTE — H&P (View-Only) (Signed)
 CONVERSION TO PROXIMAL FEMUR REPLACEMENT, HEMIARTHROPLASTY ADMISSION H&P  Patient is admitted for right hip conversion to proximal femur replacement hemiarthroplasty.  Subjective:  Chief Complaint: right hip pain  HPI: Chris [name], 63 y.o. male, has a history of pain and functional disability in the right hip due to trauma and patient has failed surgical and conservative treatments for greater than 12 weeks to include NSAID's and/or analgesics, use of assistive devices, activity modification, and open reduction internal fixation of fracture . The indications for the revision total hip arthroplasty are fracture or mechanical failure of one or more component and nonunion of subtrochanteric femoral fracture .  Onset of symptoms was abrupt starting 2 years ago with gradually worsening course since that time.  Prior procedures on the right hip include  open reduction internal fixation x 2 .  Patient currently rates pain in the right hip at 10 out of 10 with activity.  There is night pain, worsening of pain with activity and weight bearing, pain that interfers with activities of daily living, pain with passive range of motion, and difficulty bearing weight; currently presenting in wheelchair . Patient has evidence of  nonunion of fracture, and failure of IM Nail  by imaging studies.  This condition presents safety issues increasing the risk of falls.    There is no current active infection.  Patient Active Problem List   Diagnosis Date Noted   Cognitive changes 01/31/2024   Displaced intertrochanteric fracture of right femur, subsequent encounter for closed fracture with nonunion 04/03/2023   Subtrochanteric fracture of right femur, closed, with nonunion, subsequent encounter 03/21/2023   Abnormal liver CT 01/07/2023   Primary adenocarcinoma of upper lobe of right lung (HCC) 10/18/2021   Encounter for antineoplastic chemotherapy 10/18/2021   Encounter for antineoplastic immunotherapy 10/18/2021    Right upper lobe pulmonary nodule 09/27/2021   Squamous cell carcinoma of scalp 09/13/2021   Tobacco abuse 04/08/2018   Malignant neoplasm of prostate (HCC) 03/25/2018   Malignant neoplasm of prostate metastatic to bone (HCC) 03/25/2018   Coronary artery disease 10/27/2013   Hyperlipidemia 10/27/2013   Past Medical History:  Diagnosis Date   Anxiety    associated with medical care,  worsened by difficulty hearing, does better with wife present   Arthritis    Cancer associated pain    Cancer, metastatic to liver (HCC)    Complication of anesthesia    wife states very anxious may need pre sedation   Coronary artery disease    Dyspnea    GERD (gastroesophageal reflux disease)    Hearing loss    mild per wife   Hearing loss    no hearing aids per wife   Hyperlipidemia    Hypertension    MI (myocardial infarction) (HCC) 04/17/2006   Acute inferolateral wall MI with cardiogenic shock and complete heart block   Primary adenocarcinoma of upper lobe of right lung (HCC)    Prostate cancer (HCC)    Sleep apnea    Tobacco abuse    Wears glasses    Wears partial dentures    Upper    Past Surgical History:  Procedure Laterality Date   BRONCHIAL BIOPSY  10/09/2021   Procedure: BRONCHIAL BIOPSIES;  Surgeon: Denson Flake, MD;  Location: [facility] ENDOSCOPY;  Service: Pulmonary;;   BRONCHIAL BRUSHINGS  10/09/2021   Procedure: BRONCHIAL BRUSHINGS;  Surgeon: Denson Flake, MD;  Location: [facility] ENDOSCOPY;  Service: Pulmonary;;   BRONCHIAL NEEDLE ASPIRATION BIOPSY  10/09/2021   Procedure: BRONCHIAL NEEDLE  ASPIRATION BIOPSIES;  Surgeon: Denson Flake, MD;  Location: [facility] ENDOSCOPY;  Service: Pulmonary;;   CARDIAC CATHETERIZATION  2007   left, RCA 100% occluded ruptured plaque with thrombus in the proximal segment   CYST EXCISION N/A 08/30/2021   Procedure: EXCISION OF POSTERIOR SCALP CYST;  Surgeon: Adalberto Acton, MD;  Location: [facility] ORS;  Service: General;  Laterality: N/A;   CYSTOSCOPY N/A  07/17/2018   Procedure: Orin Birk;  Surgeon: Trent Frizzle, MD;  Location: [facility];  Service: Urology;  Laterality: N/A;  no seeds in bladder per Dr Joie Narrow   FEMUR IM NAIL Right 04/03/2023   Procedure: REVISION FIXATION OF RIGHT FEMUR NONUNION;  Surgeon: Laneta Pintos, MD;  Location: [facility] OR;  Service: Orthopedics;  Laterality: Right;   FIDUCIAL MARKER PLACEMENT  10/09/2021   Procedure: FIDUCIAL MARKER PLACEMENT;  Surgeon: Denson Flake, MD;  Location: [facility] ENDOSCOPY;  Service: Pulmonary;;   HAND SURGERY Right    has metal plate in arm   HARDWARE REMOVAL Right 04/03/2023   Procedure: REMOVAL OF PREVIOUS HARDWARE FEMUR;  Surgeon: Laneta Pintos, MD;  Location: [facility] OR;  Service: Orthopedics;  Laterality: Right;   PROSTATE BIOPSY     RADIOACTIVE SEED IMPLANT N/A 07/17/2018   Procedure: RADIOACTIVE SEED IMPLANT/BRACHYTHERAPY IMPLANT;  Surgeon: Trent Frizzle, MD;  Location: [facility];  Service: Urology;  Laterality: N/A;  77 seeds   SPACE OAR INSTILLATION N/A 07/17/2018   Procedure: SPACE OAR INSTILLATION;  Surgeon: Trent Frizzle, MD;  Location[facility];  Service: Urology;  Laterality: N/A;   VIDEO BRONCHOSCOPY WITH RADIAL ENDOBRONCHIAL ULTRASOUND  10/09/2021   Procedure: VIDEO BRONCHOSCOPY WITH RADIAL ENDOBRONCHIAL ULTRASOUND;  Surgeon: Denson Flake, MD;  Location: [facility] ENDOSCOPY;  Service: Pulmonary;;   WRIST SURGERY  10/04/2012    Current Outpatient Medications  Medication Sig Dispense Refill Last Dose/Taking   albuterol  (PROVENTIL ) (2.5 MG/3ML) 0.083% nebulizer solution Take 3 mLs (2.5 mg total) by nebulization every 6 (six) hours as needed for wheezing or shortness of breath. (Patient not taking: Reported on 12/31/2023) 75 mL 12    albuterol  (VENTOLIN  HFA) 108 (90 Base) MCG/ACT inhaler Inhale 2 puffs into the lungs every 6 (six) hours as needed for wheezing or shortness of breath. 6.7 g 6    ALPRAZolam  (XANAX ) 0.25 MG tablet  Take 1 tablet (0.25 mg total) by mouth at bedtime. (Patient taking differently: Take 0.25 mg by mouth at bedtime as needed for anxiety.) 15 tablet 0    atorvastatin  (LIPITOR) 40 MG tablet Take 1 tablet (40 mg total) by mouth daily. 90 tablet 3    Budeson-Glycopyrrol-Formoterol (BREZTRI  AEROSPHERE) 160-9-4.8 MCG/ACT AERO Inhale 2 puffs into the lungs in the morning and at bedtime. 10.7 g 6    clopidogrel  (PLAVIX ) 75 MG tablet Take 1 tablet (75 mg total) by mouth daily. 90 tablet 3    fluticasone  (FLONASE ) 50 MCG/ACT nasal spray Place 1 spray into both nostrils daily as needed for allergies.      ketoconazole (NIZORAL) 2 % cream Apply 1 application  topically 2 (two) times daily as needed for irritation.      meloxicam  (MOBIC ) 7.5 MG tablet Take 1 tablet (7.5 mg total) by mouth daily. 14 tablet 0    metoprolol  tartrate (LOPRESSOR ) 25 MG tablet Take 1 tablet (25 mg total) by mouth daily. 90 tablet 3    Nebulizers (COMPRESSOR/NEBULIZER) MISC Use as directed for nebulizer medication. 1 each 0    Oxycodone  HCl 10 MG  TABS Take 0.5-1 tablets (5-10 mg total) by mouth every 4 (four) hours as needed. 60 tablet 0    pantoprazole  (PROTONIX ) 40 MG tablet Take 1 tablet (40 mg total) by mouth daily. 60 tablet 1    sennosides-docusate sodium  (SENOKOT-S) 8.6-50 MG tablet Take 1 tablet by mouth daily. 60 tablet 1    sertraline  (ZOLOFT ) 100 MG tablet TAKE 1 TABLET BY MOUTH EVERY DAY 90 tablet 0    sertraline  (ZOLOFT ) 100 MG tablet Take 1 tablet (100 mg total) by mouth daily. 90 tablet 3    tamsulosin  (FLOMAX ) 0.4 MG CAPS capsule Take 1 capsule (0.4 mg total) by mouth daily. 30 capsule 3    Vitamin D , Ergocalciferol , (DRISDOL ) 1.25 MG (50000 UNIT) CAPS capsule TAKE 1 CAPSULE BY MOUTH ONE TIME PER WEEK 12 capsule 1    No current facility-administered medications for this visit.   Allergies  Allergen Reactions   Pollen Extract Cough    Social History   Tobacco Use   Smoking status: Every Day    Current  packs/day: 0.75    Average packs/day: 0.8 packs/day for 42.0 years (31.5 ttl pk-yrs)    Types: Cigarettes   Smokeless tobacco: Never   Tobacco comments:    Smokes 0.25 PPD - [facility] - 02/12/24     Started smoking at 63 years old    Smoked 2 PPD at his heaviest.   Substance Use Topics   Alcohol use: No    Family History  Problem Relation Age of Onset   Breast cancer Mother    Prostate cancer Neg Hx    Kidney cancer Neg Hx    Cancer Neg Hx       Review of Systems  Musculoskeletal:  Positive for arthralgias.  All other systems reviewed and are negative.   Objective:  Physical Exam Constitutional:      General: He is not in acute distress.    Appearance: Normal appearance. He is normal weight.     Comments: Presents in wheelchair  HENT:     Head: Normocephalic and atraumatic.  Eyes:     Extraocular Movements: Extraocular movements intact.     Conjunctiva/sclera: Conjunctivae normal.     Pupils: Pupils are equal, round, and reactive to light.  Cardiovascular:     Rate and Rhythm: Normal rate and regular rhythm.     Pulses: Normal pulses.     Heart sounds: Normal heart sounds.  Pulmonary:     Effort: Pulmonary effort is normal. No respiratory distress.     Breath sounds: Normal breath sounds.  Abdominal:     General: Bowel sounds are normal. There is no distension.     Palpations: Abdomen is soft.     Tenderness: There is no abdominal tenderness.  Musculoskeletal:        General: Tenderness present.     Cervical back: Normal range of motion and neck supple.     Comments: TTP over groin, lateral aspect, greater trochanter.  Mild IT band tenderness.  No significant swelling.  Well healed proximal incision and two distal small healed incisions, otherwise no overlying lesions of area of chief complaint.  Decreased strength and ROM due to elicited pain, though significant ROM deferred due to known malunion and hardware failure.  Dorsiflexion and plantarflexion intact.  BLE appear  grossly neurovascularly intact.  Gait mildly antalgic.   Lymphadenopathy:     Cervical: No cervical adenopathy.  Skin:    General: Skin is warm and dry.  Capillary Refill: Capillary refill takes less than 2 seconds.     Findings: No erythema or rash.  Neurological:     General: No focal deficit present.     Mental Status: He is alert and oriented to person, place, and time.  Psychiatric:        Mood and Affect: Mood normal.        Behavior: Behavior normal.     Vital signs in last 24 hours: @VSRANGES @   Labs:   Estimated body mass index is 34.59 kg/m as calculated from the following:   Height as of 02/12/24: 5\' 11"  (1.803 m).   Weight as of 02/12/24: 112.5 kg.  Imaging Review:  Plain radiographs demonstrate nonunion of subtrochanteric fracture of the right hip(s). Acetabular cup appears unaffected.  Also noted failure of proximal hardware of IM nail of internal fixation.  The bone quality appears to be fair for age and reported activity level.      Assessment/Plan:  Conversion to proximal femur replacement hemiarthroplasty, right hip right hip(s) with failed previous open reduction internal fixation.  The patient history, physical examination, clinical judgement of the provider and imaging studies are consistent with nonunion of subtrochanteric fracture of right femur and failure of IM Nail of the right hip(s). Conversion to proximal femur replacement hemiarthroplasty is deemed medically necessary. The treatment options including medical management, injection therapy, arthroscopy and arthroplasty were discussed at length. The risks and benefits of total hip arthroplasty were presented and reviewed. The risks due to aseptic loosening, infection, stiffness, dislocation/subluxation,  thromboembolic complications and other imponderables were discussed.  Also discussed possibility of total hip replacement depending on state of acetabulum/socket upon surgical evaluation.  The  patient acknowledged the explanation, agreed to proceed with the plan and consent was signed. Patient is being admitted for inpatient treatment for surgery, pain control, PT, OT, prophylactic antibiotics, VTE prophylaxis, progressive ambulation and ADL's and discharge planning. The patient is planning to be discharged home with HHPT ([facility]).

## 2024-02-20 NOTE — Telephone Encounter (Signed)
 Dr. Jadali wants to talk to about this patient. His number is [phone]

## 2024-02-20 NOTE — H&P (Signed)
 CONVERSION TO PROXIMAL FEMUR REPLACEMENT, HEMIARTHROPLASTY ADMISSION H&P  Patient is admitted for right hip conversion to proximal femur replacement hemiarthroplasty.  Subjective:  Chief Complaint: right hip pain  HPI: Chris [name], 63 y.o. male, has a history of pain and functional disability in the right hip due to trauma and patient has failed surgical and conservative treatments for greater than 12 weeks to include NSAID's and/or analgesics, use of assistive devices, activity modification, and open reduction internal fixation of fracture . The indications for the revision total hip arthroplasty are fracture or mechanical failure of one or more component and nonunion of subtrochanteric femoral fracture .  Onset of symptoms was abrupt starting 2 years ago with gradually worsening course since that time.  Prior procedures on the right hip include  open reduction internal fixation x 2 .  Patient currently rates pain in the right hip at 10 out of 10 with activity.  There is night pain, worsening of pain with activity and weight bearing, pain that interfers with activities of daily living, pain with passive range of motion, and difficulty bearing weight; currently presenting in wheelchair . Patient has evidence of  nonunion of fracture, and failure of IM Nail  by imaging studies.  This condition presents safety issues increasing the risk of falls.    There is no current active infection.  Patient Active Problem List   Diagnosis Date Noted   Cognitive changes 01/31/2024   Displaced intertrochanteric fracture of right femur, subsequent encounter for closed fracture with nonunion 04/03/2023   Subtrochanteric fracture of right femur, closed, with nonunion, subsequent encounter 03/21/2023   Abnormal liver CT 01/07/2023   Primary adenocarcinoma of upper lobe of right lung (HCC) 10/18/2021   Encounter for antineoplastic chemotherapy 10/18/2021   Encounter for antineoplastic immunotherapy 10/18/2021    Right upper lobe pulmonary nodule 09/27/2021   Squamous cell carcinoma of scalp 09/13/2021   Tobacco abuse 04/08/2018   Malignant neoplasm of prostate (HCC) 03/25/2018   Malignant neoplasm of prostate metastatic to bone (HCC) 03/25/2018   Coronary artery disease 10/27/2013   Hyperlipidemia 10/27/2013   Past Medical History:  Diagnosis Date   Anxiety    associated with medical care,  worsened by difficulty hearing, does better with wife present   Arthritis    Cancer associated pain    Cancer, metastatic to liver (HCC)    Complication of anesthesia    wife states very anxious may need pre sedation   Coronary artery disease    Dyspnea    GERD (gastroesophageal reflux disease)    Hearing loss    mild per wife   Hearing loss    no hearing aids per wife   Hyperlipidemia    Hypertension    MI (myocardial infarction) (HCC) 04/17/2006   Acute inferolateral wall MI with cardiogenic shock and complete heart block   Primary adenocarcinoma of upper lobe of right lung (HCC)    Prostate cancer (HCC)    Sleep apnea    Tobacco abuse    Wears glasses    Wears partial dentures    Upper    Past Surgical History:  Procedure Laterality Date   BRONCHIAL BIOPSY  10/09/2021   Procedure: BRONCHIAL BIOPSIES;  Surgeon: Denson Flake, MD;  Location: [facility] ENDOSCOPY;  Service: Pulmonary;;   BRONCHIAL BRUSHINGS  10/09/2021   Procedure: BRONCHIAL BRUSHINGS;  Surgeon: Denson Flake, MD;  Location: [facility] ENDOSCOPY;  Service: Pulmonary;;   BRONCHIAL NEEDLE ASPIRATION BIOPSY  10/09/2021   Procedure: BRONCHIAL NEEDLE  ASPIRATION BIOPSIES;  Surgeon: Denson Flake, MD;  Location: [facility] ENDOSCOPY;  Service: Pulmonary;;   CARDIAC CATHETERIZATION  2007   left, RCA 100% occluded ruptured plaque with thrombus in the proximal segment   CYST EXCISION N/A 08/30/2021   Procedure: EXCISION OF POSTERIOR SCALP CYST;  Surgeon: Adalberto Acton, MD;  Location: [facility] ORS;  Service: General;  Laterality: N/A;   CYSTOSCOPY N/A  07/17/2018   Procedure: Orin Birk;  Surgeon: Trent Frizzle, MD;  Location: [facility];  Service: Urology;  Laterality: N/A;  no seeds in bladder per Dr Joie Narrow   FEMUR IM NAIL Right 04/03/2023   Procedure: REVISION FIXATION OF RIGHT FEMUR NONUNION;  Surgeon: Laneta Pintos, MD;  Location: [facility] OR;  Service: Orthopedics;  Laterality: Right;   FIDUCIAL MARKER PLACEMENT  10/09/2021   Procedure: FIDUCIAL MARKER PLACEMENT;  Surgeon: Denson Flake, MD;  Location: [facility] ENDOSCOPY;  Service: Pulmonary;;   HAND SURGERY Right    has metal plate in arm   HARDWARE REMOVAL Right 04/03/2023   Procedure: REMOVAL OF PREVIOUS HARDWARE FEMUR;  Surgeon: Laneta Pintos, MD;  Location: [facility] OR;  Service: Orthopedics;  Laterality: Right;   PROSTATE BIOPSY     RADIOACTIVE SEED IMPLANT N/A 07/17/2018   Procedure: RADIOACTIVE SEED IMPLANT/BRACHYTHERAPY IMPLANT;  Surgeon: Trent Frizzle, MD;  Location: [facility];  Service: Urology;  Laterality: N/A;  77 seeds   SPACE OAR INSTILLATION N/A 07/17/2018   Procedure: SPACE OAR INSTILLATION;  Surgeon: Trent Frizzle, MD;  Location[facility];  Service: Urology;  Laterality: N/A;   VIDEO BRONCHOSCOPY WITH RADIAL ENDOBRONCHIAL ULTRASOUND  10/09/2021   Procedure: VIDEO BRONCHOSCOPY WITH RADIAL ENDOBRONCHIAL ULTRASOUND;  Surgeon: Denson Flake, MD;  Location: [facility] ENDOSCOPY;  Service: Pulmonary;;   WRIST SURGERY  10/04/2012    Current Outpatient Medications  Medication Sig Dispense Refill Last Dose/Taking   albuterol  (PROVENTIL ) (2.5 MG/3ML) 0.083% nebulizer solution Take 3 mLs (2.5 mg total) by nebulization every 6 (six) hours as needed for wheezing or shortness of breath. (Patient not taking: Reported on 12/31/2023) 75 mL 12    albuterol  (VENTOLIN  HFA) 108 (90 Base) MCG/ACT inhaler Inhale 2 puffs into the lungs every 6 (six) hours as needed for wheezing or shortness of breath. 6.7 g 6    ALPRAZolam  (XANAX ) 0.25 MG tablet  Take 1 tablet (0.25 mg total) by mouth at bedtime. (Patient taking differently: Take 0.25 mg by mouth at bedtime as needed for anxiety.) 15 tablet 0    atorvastatin  (LIPITOR) 40 MG tablet Take 1 tablet (40 mg total) by mouth daily. 90 tablet 3    Budeson-Glycopyrrol-Formoterol (BREZTRI  AEROSPHERE) 160-9-4.8 MCG/ACT AERO Inhale 2 puffs into the lungs in the morning and at bedtime. 10.7 g 6    clopidogrel  (PLAVIX ) 75 MG tablet Take 1 tablet (75 mg total) by mouth daily. 90 tablet 3    fluticasone  (FLONASE ) 50 MCG/ACT nasal spray Place 1 spray into both nostrils daily as needed for allergies.      ketoconazole (NIZORAL) 2 % cream Apply 1 application  topically 2 (two) times daily as needed for irritation.      meloxicam  (MOBIC ) 7.5 MG tablet Take 1 tablet (7.5 mg total) by mouth daily. 14 tablet 0    metoprolol  tartrate (LOPRESSOR ) 25 MG tablet Take 1 tablet (25 mg total) by mouth daily. 90 tablet 3    Nebulizers (COMPRESSOR/NEBULIZER) MISC Use as directed for nebulizer medication. 1 each 0    Oxycodone  HCl 10 MG  TABS Take 0.5-1 tablets (5-10 mg total) by mouth every 4 (four) hours as needed. 60 tablet 0    pantoprazole  (PROTONIX ) 40 MG tablet Take 1 tablet (40 mg total) by mouth daily. 60 tablet 1    sennosides-docusate sodium  (SENOKOT-S) 8.6-50 MG tablet Take 1 tablet by mouth daily. 60 tablet 1    sertraline  (ZOLOFT ) 100 MG tablet TAKE 1 TABLET BY MOUTH EVERY DAY 90 tablet 0    sertraline  (ZOLOFT ) 100 MG tablet Take 1 tablet (100 mg total) by mouth daily. 90 tablet 3    tamsulosin  (FLOMAX ) 0.4 MG CAPS capsule Take 1 capsule (0.4 mg total) by mouth daily. 30 capsule 3    Vitamin D , Ergocalciferol , (DRISDOL ) 1.25 MG (50000 UNIT) CAPS capsule TAKE 1 CAPSULE BY MOUTH ONE TIME PER WEEK 12 capsule 1    No current facility-administered medications for this visit.   Allergies  Allergen Reactions   Pollen Extract Cough    Social History   Tobacco Use   Smoking status: Every Day    Current  packs/day: 0.75    Average packs/day: 0.8 packs/day for 42.0 years (31.5 ttl pk-yrs)    Types: Cigarettes   Smokeless tobacco: Never   Tobacco comments:    Smokes 0.25 PPD - [facility] - 02/12/24     Started smoking at 63 years old    Smoked 2 PPD at his heaviest.   Substance Use Topics   Alcohol use: No    Family History  Problem Relation Age of Onset   Breast cancer Mother    Prostate cancer Neg Hx    Kidney cancer Neg Hx    Cancer Neg Hx       Review of Systems  Musculoskeletal:  Positive for arthralgias.  All other systems reviewed and are negative.   Objective:  Physical Exam Constitutional:      General: He is not in acute distress.    Appearance: Normal appearance. He is normal weight.     Comments: Presents in wheelchair  HENT:     Head: Normocephalic and atraumatic.  Eyes:     Extraocular Movements: Extraocular movements intact.     Conjunctiva/sclera: Conjunctivae normal.     Pupils: Pupils are equal, round, and reactive to light.  Cardiovascular:     Rate and Rhythm: Normal rate and regular rhythm.     Pulses: Normal pulses.     Heart sounds: Normal heart sounds.  Pulmonary:     Effort: Pulmonary effort is normal. No respiratory distress.     Breath sounds: Normal breath sounds.  Abdominal:     General: Bowel sounds are normal. There is no distension.     Palpations: Abdomen is soft.     Tenderness: There is no abdominal tenderness.  Musculoskeletal:        General: Tenderness present.     Cervical back: Normal range of motion and neck supple.     Comments: TTP over groin, lateral aspect, greater trochanter.  Mild IT band tenderness.  No significant swelling.  Well healed proximal incision and two distal small healed incisions, otherwise no overlying lesions of area of chief complaint.  Decreased strength and ROM due to elicited pain, though significant ROM deferred due to known malunion and hardware failure.  Dorsiflexion and plantarflexion intact.  BLE appear  grossly neurovascularly intact.  Gait mildly antalgic.   Lymphadenopathy:     Cervical: No cervical adenopathy.  Skin:    General: Skin is warm and dry.  Capillary Refill: Capillary refill takes less than 2 seconds.     Findings: No erythema or rash.  Neurological:     General: No focal deficit present.     Mental Status: He is alert and oriented to person, place, and time.  Psychiatric:        Mood and Affect: Mood normal.        Behavior: Behavior normal.     Vital signs in last 24 hours: @VSRANGES @   Labs:   Estimated body mass index is 34.59 kg/m as calculated from the following:   Height as of 02/12/24: 5\' 11"  (1.803 m).   Weight as of 02/12/24: 112.5 kg.  Imaging Review:  Plain radiographs demonstrate nonunion of subtrochanteric fracture of the right hip(s). Acetabular cup appears unaffected.  Also noted failure of proximal hardware of IM nail of internal fixation.  The bone quality appears to be fair for age and reported activity level.      Assessment/Plan:  Conversion to proximal femur replacement hemiarthroplasty, right hip right hip(s) with failed previous open reduction internal fixation.  The patient history, physical examination, clinical judgement of the provider and imaging studies are consistent with nonunion of subtrochanteric fracture of right femur and failure of IM Nail of the right hip(s). Conversion to proximal femur replacement hemiarthroplasty is deemed medically necessary. The treatment options including medical management, injection therapy, arthroscopy and arthroplasty were discussed at length. The risks and benefits of total hip arthroplasty were presented and reviewed. The risks due to aseptic loosening, infection, stiffness, dislocation/subluxation,  thromboembolic complications and other imponderables were discussed.  Also discussed possibility of total hip replacement depending on state of acetabulum/socket upon surgical evaluation.  The  patient acknowledged the explanation, agreed to proceed with the plan and consent was signed. Patient is being admitted for inpatient treatment for surgery, pain control, PT, OT, prophylactic antibiotics, VTE prophylaxis, progressive ambulation and ADL's and discharge planning. The patient is planning to be discharged home with HHPT ([facility]).

## 2024-02-22 NOTE — Patient Instructions (Signed)
 SURGICAL WAITING ROOM VISITATION Patients having surgery or a procedure may have no more than 2 support people in the waiting area - these visitors may rotate in the visitor waiting room.   If the patient needs to stay at the hospital during part of their recovery, the visitor guidelines for inpatient rooms apply.  PRE-OP VISITATION  Pre-op nurse will coordinate an appropriate time for 1 support person to accompany the patient in pre-op.  This support person may not rotate.  This visitor will be contacted when the time is appropriate for the visitor to come back in the pre-op area.  Please refer to the [facility] website for the visitor guidelines for Inpatients (after your surgery is over and you are in a regular room).  You are not required to quarantine at this time prior to your surgery. However, you must do this: Hand Hygiene often Do NOT share personal items Notify your provider if you are in close contact with someone who has COVID or you develop fever 100.4 or greater, new onset of sneezing, cough, sore throat, shortness of breath or body aches.  If you test positive for Covid or have been in contact with anyone that has tested positive in the last 10 days please notify you surgeon.    Your procedure is scheduled on:  Wednesday  February 26, 2024  Report to [facility] Main Entrance: [address] entrance where the [employer] is available.   Report to admitting at:  10:30   AM  Call this number if you have any questions or problems the morning of surgery [phone]  Do not eat food after Midnight the night prior to your surgery/procedure.  After Midnight you may have the following liquids until 10:00 AM DAY OF SURGERY  Clear Liquid Diet Water Black Coffee (sugar ok, NO MILK/CREAM OR CREAMERS)  Tea (sugar ok, NO MILK/CREAM OR CREAMERS) regular and decaf                             Plain Jell-O  with no fruit (NO RED)                                           Fruit ices  (not with fruit pulp, NO RED)                                     Popsicles (NO RED)                                                                  Juice: NO CITRUS JUICES: only apple, WHITE grape, WHITE cranberry Sports drinks like Gatorade or Powerade (NO RED)                   The day of surgery:  Drink ONE (1) Pre-Surgery Clear Ensure at 10:00 AM the morning of surgery. Drink in one sitting. Do not sip.  This drink was given to you during your hospital pre-op appointment visit. Nothing else to drink after completing the Pre-Surgery  Clear Ensure : No candy, chewing gum or throat lozenges.    FOLLOW ANY ADDITIONAL PRE OP INSTRUCTIONS YOU RECEIVED FROM YOUR SURGEON'S OFFICE!!!   Oral Hygiene is also important to reduce your risk of infection.        Remember - BRUSH YOUR TEETH THE MORNING OF SURGERY WITH YOUR REGULAR TOOTHPASTE  Do NOT smoke after Midnight the night before surgery.  STOP TAKING all Vitamins, Herbs and supplements 1 week before your surgery.   Take ONLY these medicines the morning of surgery with A SIP OF WATER: Pantoprazole , tamsulosin , metoprolol , sertraline , and you may take Oxycodone  for pain.   You may use your Flonase  nasal spray, inhalers and nebulizer if needed.  Please bring you Albuterol  inhaler with you on the surgery day   You may not have any metal on your body including , jewelry, and body piercing.  Do not wear  lotions, powders,cologne, or deodorant  Men may shave face and neck.  Contacts, Hearing Aids, dentures or bridgework may not be worn into surgery. DENTURES WILL BE REMOVED PRIOR TO SURGERY PLEASE DO NOT APPLY "Poly grip" OR ADHESIVES!!!  You may bring a small overnight bag with you on the day of surgery, only pack items that are not valuable. [facility] IS NOT RESPONSIBLE   FOR VALUABLES THAT ARE LOST OR STOLEN.   Do not bring your home medications to the hospital. The Pharmacy will dispense medications listed on your medication list to  you during your admission in the Hospital.  Please read over the following fact sheets you were given: IF YOU HAVE QUESTIONS ABOUT YOUR PRE-OP INSTRUCTIONS, PLEASE CALL [phone].     Pre-operative 5 CHG Bath Instructions   You can play a key role in reducing the risk of infection after surgery. Your skin needs to be as free of germs as possible. You can reduce the number of germs on your skin by washing with CHG (chlorhexidine  gluconate) soap before surgery. CHG is an antiseptic soap that kills germs and continues to kill germs even after washing.   DO NOT use if you have an allergy to chlorhexidine /CHG or antibacterial soaps. If your skin becomes reddened or irritated, stop using the CHG and notify one of our RNs at [phone]  Please shower with the CHG soap starting 4 days before surgery using the following schedule: START SHOWERS ON    MONDAY 02-24-24                                                                                                                                                                              Please keep in mind the following:  DO NOT shave, including legs and underarms, starting the  day of your first shower.   You may shave your face at any point before/day of surgery.   Place clean sheets on your bed the day you start using CHG soap. Use a clean washcloth (not used since being washed) for each shower. DO NOT sleep with pets once you start using the CHG.   CHG Shower Instructions:  If you choose to wash your hair and private area, wash first with your normal shampoo/soap.  After you use shampoo/soap, rinse your hair and body thoroughly to remove shampoo/soap residue.  Turn the water OFF and apply about 3 tablespoons (45 ml) of CHG soap to a CLEAN washcloth.  Apply CHG soap ONLY FROM YOUR NECK DOWN TO YOUR TOES (washing for 3-5 minutes)  DO NOT use CHG soap on face, private areas, open wounds, or sores.  Pay special attention to the area where your  surgery is being performed.  If you are having back surgery, having someone wash your back for you may be helpful.  Wait 2 minutes after CHG soap is applied, then you may rinse off the CHG soap.  Pat dry with a clean towel  Put on clean clothes/pajamas   If you choose to wear lotion, please use ONLY the CHG-compatible lotions on the back of this paper.     Additional instructions for the day of surgery: DO NOT APPLY any lotions, deodorants, cologne, or perfumes.   Put on clean/comfortable clothes.  Brush your teeth.  Ask your nurse before applying any prescription medications to the skin.      CHG Compatible Lotions   Aveeno Moisturizing lotion  Cetaphil Moisturizing Cream  Cetaphil Moisturizing Lotion  Clairol Herbal Essence Moisturizing Lotion, Dry Skin  Clairol Herbal Essence Moisturizing Lotion, Extra Dry Skin  Clairol Herbal Essence Moisturizing Lotion, Normal Skin  Curel Age Defying Therapeutic Moisturizing Lotion with Alpha Hydroxy  Curel Extreme Care Body Lotion  Curel Soothing Hands Moisturizing Hand Lotion  Curel Therapeutic Moisturizing Cream, Fragrance-Free  Curel Therapeutic Moisturizing Lotion, Fragrance-Free  Curel Therapeutic Moisturizing Lotion, Original Formula  Eucerin Daily Replenishing Lotion  Eucerin Dry Skin Therapy Plus Alpha Hydroxy Crme  Eucerin Dry Skin Therapy Plus Alpha Hydroxy Lotion  Eucerin Original Crme  Eucerin Original Lotion  Eucerin Plus Crme Eucerin Plus Lotion  Eucerin TriLipid Replenishing Lotion  Keri Anti-Bacterial Hand Lotion  Keri Deep Conditioning Original Lotion Dry Skin Formula Softly Scented  Keri Deep Conditioning Original Lotion, Fragrance Free Sensitive Skin Formula  Keri Lotion Fast Absorbing Fragrance Free Sensitive Skin Formula  Keri Lotion Fast Absorbing Softly Scented Dry Skin Formula  Keri Original Lotion  Keri Skin Renewal Lotion Keri Silky Smooth Lotion  Keri Silky Smooth Sensitive Skin Lotion  Nivea Body  Creamy Conditioning Oil  Nivea Body Extra Enriched Lotion  Nivea Body Original Lotion  Nivea Body Sheer Moisturizing Lotion Nivea Crme  Nivea Skin Firming Lotion  NutraDerm 30 Skin Lotion  NutraDerm Skin Lotion  NutraDerm Therapeutic Skin Cream  NutraDerm Therapeutic Skin Lotion  ProShield Protective Hand Cream  Provon moisturizing lotion   FAILURE TO FOLLOW THESE INSTRUCTIONS MAY RESULT IN THE CANCELLATION OF YOUR SURGERY  PATIENT SIGNATURE_________________________________  NURSE SIGNATURE__________________________________  ________________________________________________________________________        Benjamen Brand      An incentive spirometer is a tool that can help keep your lungs clear and active. This tool measures how well you are filling your lungs with each breath. Taking long deep breaths may help reverse or decrease the chance of developing breathing (  pulmonary) problems (especially infection) following: A long period of time when you are unable to move or be active. BEFORE THE PROCEDURE  If the spirometer includes an indicator to show your best effort, your nurse or respiratory therapist will set it to a desired goal. If possible, sit up straight or lean slightly forward. Try not to slouch. Hold the incentive spirometer in an upright position. INSTRUCTIONS FOR USE  Sit on the edge of your bed if possible, or sit up as far as you can in bed or on a chair. Hold the incentive spirometer in an upright position. Breathe out normally. Place the mouthpiece in your mouth and seal your lips tightly around it. Breathe in slowly and as deeply as possible, raising the piston or the ball toward the top of the column. Hold your breath for 3-5 seconds or for as long as possible. Allow the piston or ball to fall to the bottom of the column. Remove the mouthpiece from your mouth and breathe out normally. Rest for a few seconds and repeat Steps 1 through 7 at least 10  times every 1-2 hours when you are awake. Take your time and take a few normal breaths between deep breaths. The spirometer may include an indicator to show your best effort. Use the indicator as a goal to work toward during each repetition. After each set of 10 deep breaths, practice coughing to be sure your lungs are clear. If you have an incision (the cut made at the time of surgery), support your incision when coughing by placing a pillow or rolled up towels firmly against it. Once you are able to get out of bed, walk around indoors and cough well. You may stop using the incentive spirometer when instructed by your caregiver.  RISKS AND COMPLICATIONS Take your time so you do not get dizzy or light-headed. If you are in pain, you may need to take or ask for pain medication before doing incentive spirometry. It is harder to take a deep breath if you are having pain. AFTER USE Rest and breathe slowly and easily. It can be helpful to keep track of a log of your progress. Your caregiver can provide you with a simple table to help with this. If you are using the spirometer at home, follow these instructions: SEEK MEDICAL CARE IF:  You are having difficultly using the spirometer. You have trouble using the spirometer as often as instructed. Your pain medication is not giving enough relief while using the spirometer. You develop fever of 100.5 F (38.1 C) or higher.                                                                                                    SEEK IMMEDIATE MEDICAL CARE IF:  You cough up bloody sputum that had not been present before. You develop fever of 102 F (38.9 C) or greater. You develop worsening pain at or near the incision site. MAKE SURE YOU:  Understand these instructions. Will watch your condition. Will get help right away if you are not doing well or get worse.  Document Released: 02/25/2007 Document Revised: 01/07/2012 Document Reviewed: 04/28/2007 [facility]  Patient Information 2014 [place], [place].Incentive Spirometer    WHAT IS A BLOOD TRANSFUSION? Blood Transfusion Information  A transfusion is the replacement of blood or some of its parts. Blood is made up of multiple cells which provide different functions. Red blood cells carry oxygen and are used for blood loss replacement. White blood cells fight against infection. Platelets control bleeding. Plasma helps clot blood. Other blood products are available for specialized needs, such as hemophilia or other clotting disorders. BEFORE THE TRANSFUSION  Who gives blood for transfusions?  Healthy volunteers who are fully evaluated to make sure their blood is safe. This is blood bank blood. Transfusion therapy is the safest it has ever been in the practice of medicine. Before blood is taken from a donor, a complete history is taken to make sure that person has no history of diseases nor engages in risky social behavior (examples are intravenous drug use or sexual activity with multiple partners). The donor's travel history is screened to minimize risk of transmitting infections, such as malaria. The donated blood is tested for signs of infectious diseases, such as HIV and hepatitis. The blood is then tested to be sure it is compatible with you in order to minimize the chance of a transfusion reaction. If you or a relative donates blood, this is often done in anticipation of surgery and is not appropriate for emergency situations. It takes many days to process the donated blood. RISKS AND COMPLICATIONS Although transfusion therapy is very safe and saves many lives, the main dangers of transfusion include:  Getting an infectious disease. Developing a transfusion reaction. This is an allergic reaction to something in the blood you were given. Every precaution is taken to prevent this. The decision to have a blood transfusion has been considered carefully by your caregiver before blood is given. Blood is not  given unless the benefits outweigh the risks. AFTER THE TRANSFUSION Right after receiving a blood transfusion, you will usually feel much better and more energetic. This is especially true if your red blood cells have gotten low (anemic). The transfusion raises the level of the red blood cells which carry oxygen, and this usually causes an energy increase. The nurse administering the transfusion will monitor you carefully for complications. HOME CARE INSTRUCTIONS  No special instructions are needed after a transfusion. You may find your energy is better. Speak with your caregiver about any limitations on activity for underlying diseases you may have. SEEK MEDICAL CARE IF:  Your condition is not improving after your transfusion. You develop redness or irritation at the intravenous (IV) site. SEEK IMMEDIATE MEDICAL CARE IF:  Any of the following symptoms occur over the next 12 hours: Shaking chills. You have a temperature by mouth above 102 F (38.9 C), not controlled by medicine. Chest, back, or muscle pain. People around you feel you are not acting correctly or are confused. Shortness of breath or difficulty breathing. Dizziness and fainting. You get a rash or develop hives. You have a decrease in urine output. Your urine turns a dark color or changes to pink, red, or brown. Any of the following symptoms occur over the next 10 days: You have a temperature by mouth above 102 F (38.9 C), not controlled by medicine. Shortness of breath. Weakness after normal activity. The white part of the eye turns yellow (jaundice). You have a decrease in the amount of urine or are urinating less often. Your urine turns a  dark color or changes to pink, red, or brown. Document Released: 10/12/2000 Document Revised: 01/07/2012 Document Reviewed: 05/31/2008 [facility] Patient Information 2014 [place], [place].  _______________________________________________________________________        If you  would like to see a video about joint replacement:   [email or web address]

## 2024-02-22 NOTE — Progress Notes (Signed)
 Patient is requesting that we give him the hardware that is taken out of his hip during surgery. I left a VM with Loetta Ringer High in Dr. Deedra Farr office about this.   COVID Vaccine received:  []  No [x]  Yes Date of any COVID positive Test in last 90 days:  none  PCP - Annelle Kiel, MD  Cardiologist - Lauro Portal, MD, Katlyn West, NP cardiac clearance in 02-11-24 Epic  note.   Oncology- Hedda Liv, MD  cleared in 02-20-24 phone note  Chest x-ray - 12-31-2023  2v  Epic EKG -  02-11-2024  Epic Stress Test - 04-19-2011  Epic ECHO - 06-14-2006  Epic Cardiac Cath - 04-17-2006    Pacemaker / ICD device [x]  No []  Yes   Spinal Cord Stimulator:[x]  No []  Yes       History of Sleep Apnea? []  No [x]  Yes   CPAP used?- [x]  No []  Yes    Does the patient monitor blood sugar?   [x]  N/A   []  No []  Yes  Patient has: [x]  NO Hx DM   []  Pre-DM   []  DM1  []   DM2  Blood Thinner / Instructions:  Plavix   hold x 5 days, last dose on 02-20-24    (PST was on 02-24-24) Aspirin  Instructions:  none  ERAS Protocol Ordered: []  No  [x]  Yes PRE-SURGERY [x]  ENSURE  []  G2   Patient is to be NPO after: 1000  Dental hx: [x]  Dentures: top plate []  N/A      []  Bridge or Partial:                   []  Loose or Damaged teeth:   Comments: Patient was given the 5 CHG shower / bath instructions for hip revision surgery along with 2 bottles of the CHG soap. Patient will start this on:  PST on 02-24-24,  started  today           Activity level: Patient is unable to climb a flight of stairs without difficulty; [x]  No CP  [x]  No SOB, but would have leg pain. Patient can perform ADLs without assistance.   Anesthesia review: CAD- DES x 2 for MI 04-17-2006, OSA- No CPAP, HTN, smoker, anxiety, Hx lung prostate cancer w/ bone mets, HOH- no HAs  Patient denies shortness of breath, fever, cough and chest pain at PAT appointment.  Patient verbalized understanding and agreement to the Pre-Surgical Instructions that were given to them at  this PAT appointment. Patient was also educated of the need to review these PAT instructions again prior to his surgery.I reviewed the appropriate phone numbers to call if they have any and questions or concerns.

## 2024-02-24 ENCOUNTER — Inpatient Hospital Stay: Payer: 59 | Admitting: Internal Medicine

## 2024-02-24 ENCOUNTER — Inpatient Hospital Stay: Admitting: Hospice and Palliative Medicine

## 2024-02-24 ENCOUNTER — Encounter (HOSPITAL_COMMUNITY)
Admission: RE | Admit: 2024-02-24 | Discharge: 2024-02-24 | Disposition: A | Discharge status: Home / Self Care | Source: Ambulatory Visit | Attending: Orthopedic Surgery | Admitting: Orthopedic Surgery

## 2024-02-24 ENCOUNTER — Encounter (HOSPITAL_COMMUNITY): Payer: Self-pay

## 2024-02-24 ENCOUNTER — Other Ambulatory Visit: Payer: Self-pay

## 2024-02-24 ENCOUNTER — Inpatient Hospital Stay: Payer: 59

## 2024-02-24 VITALS — BP 138/84 | HR 68 | Temp 97.9°F | Resp 18 | Ht 71.0 in | Wt 248.0 lb

## 2024-02-24 DIAGNOSIS — J449 Chronic obstructive pulmonary disease, unspecified: Secondary | ICD-10-CM | POA: Insufficient documentation

## 2024-02-24 DIAGNOSIS — Z01818 Encounter for other preprocedural examination: Secondary | ICD-10-CM

## 2024-02-24 DIAGNOSIS — S72141K Displaced intertrochanteric fracture of right femur, subsequent encounter for closed fracture with nonunion: Secondary | ICD-10-CM | POA: Insufficient documentation

## 2024-02-24 DIAGNOSIS — Z85118 Personal history of other malignant neoplasm of bronchus and lung: Secondary | ICD-10-CM | POA: Insufficient documentation

## 2024-02-24 DIAGNOSIS — I252 Old myocardial infarction: Secondary | ICD-10-CM | POA: Insufficient documentation

## 2024-02-24 DIAGNOSIS — I2510 Atherosclerotic heart disease of native coronary artery without angina pectoris: Secondary | ICD-10-CM | POA: Insufficient documentation

## 2024-02-24 DIAGNOSIS — Z01812 Encounter for preprocedural laboratory examination: Secondary | ICD-10-CM | POA: Insufficient documentation

## 2024-02-24 DIAGNOSIS — I10 Essential (primary) hypertension: Secondary | ICD-10-CM | POA: Insufficient documentation

## 2024-02-24 DIAGNOSIS — G8929 Other chronic pain: Secondary | ICD-10-CM | POA: Insufficient documentation

## 2024-02-24 DIAGNOSIS — Z8546 Personal history of malignant neoplasm of prostate: Secondary | ICD-10-CM | POA: Insufficient documentation

## 2024-02-24 DIAGNOSIS — G4733 Obstructive sleep apnea (adult) (pediatric): Secondary | ICD-10-CM | POA: Insufficient documentation

## 2024-02-24 DIAGNOSIS — M25551 Pain in right hip: Secondary | ICD-10-CM | POA: Insufficient documentation

## 2024-02-24 DIAGNOSIS — F419 Anxiety disorder, unspecified: Secondary | ICD-10-CM | POA: Insufficient documentation

## 2024-02-24 DIAGNOSIS — Z8505 Personal history of malignant neoplasm of liver: Secondary | ICD-10-CM | POA: Insufficient documentation

## 2024-02-24 DIAGNOSIS — M84451K Pathological fracture, right femur, subsequent encounter for fracture with nonunion: Secondary | ICD-10-CM

## 2024-02-24 HISTORY — DX: Chronic obstructive pulmonary disease, unspecified: J44.9

## 2024-02-24 LAB — COMPREHENSIVE METABOLIC PANEL WITH GFR
ALT: 21 U/L (ref 0–44)
AST: 19 U/L (ref 15–41)
Albumin: 4.1 g/dL (ref 3.5–5.0)
Alkaline Phosphatase: 66 U/L (ref 38–126)
Anion gap: 10 (ref 5–15)
BUN: 12 mg/dL (ref 8–23)
CO2: 25 mmol/L (ref 22–32)
Calcium: 9.5 mg/dL (ref 8.9–10.3)
Chloride: 104 mmol/L (ref 98–111)
Creatinine, Ser: 1.01 mg/dL (ref 0.61–1.24)
GFR, Estimated: >60 mL/min (ref >60)
Glucose, Bld: 121 mg/dL — ABNORMAL HIGH (ref 70–99)
Potassium: 4.4 mmol/L (ref 3.5–5.1)
Sodium: 139 mmol/L (ref 135–145)
Total Bilirubin: 0.8 mg/dL (ref 0.0–1.2)
Total Protein: 7.4 g/dL (ref 6.5–8.1)

## 2024-02-24 LAB — CBC WITH DIFFERENTIAL/PLATELET
Abs Immature Granulocytes: 0.02 10*3/uL (ref 0.00–0.07)
Basophils Absolute: 0 10*3/uL (ref 0.0–0.1)
Basophils Relative: 1 %
Eosinophils Absolute: 0.1 10*3/uL (ref 0.0–0.5)
Eosinophils Relative: 2 %
HCT: 47 % (ref 39.0–52.0)
Hemoglobin: 15.3 g/dL (ref 13.0–17.0)
Immature Granulocytes: 0 %
Lymphocytes Relative: 18 %
Lymphs Abs: 1 10*3/uL (ref 0.7–4.0)
MCH: 29.4 pg (ref 26.0–34.0)
MCHC: 32.6 g/dL (ref 30.0–36.0)
MCV: 90.4 fL (ref 80.0–100.0)
Monocytes Absolute: 0.8 10*3/uL (ref 0.1–1.0)
Monocytes Relative: 15 %
Neutro Abs: 3.4 10*3/uL (ref 1.7–7.7)
Neutrophils Relative %: 64 %
Platelets: 226 10*3/uL (ref 150–400)
RBC: 5.2 MIL/uL (ref 4.22–5.81)
RDW: 14.6 % (ref 11.5–15.5)
WBC: 5.3 10*3/uL (ref 4.0–10.5)
nRBC: 0 % (ref 0.0–0.2)

## 2024-02-24 LAB — SURGICAL PCR SCREEN
MRSA, PCR: NEGATIVE
Staphylococcus aureus: NEGATIVE

## 2024-02-25 ENCOUNTER — Encounter (HOSPITAL_COMMUNITY): Payer: Self-pay

## 2024-02-25 NOTE — Progress Notes (Addendum)
 Case: [id number] Date/Time: 02/26/24 1215   Procedures:      CONVERSION, PREVIOUS HIP SURGERY, TO TOTAL HIP ARTHROPLASTY (Right: Hip)     CREATION, FLAP, ROTATION (Right: Leg Upper)   Anesthesia type: Spinal   Pre-op diagnosis: BROKEN HARDWARE, RIGHT HIP, RIGHT FEMUR BONE MASS EXCISION   Location: [facility] ROOM 06 / [facility]   Surgeons: Murleen Arms, MD       DISCUSSION: Chris [name] is a 63 yo male who presents to PAT prior to surgery above. PMH of every day smoking, HTN, hx of MI (2007), CAD s/p PCI (2007), COPD, OSA (unable to tolerate CPAP), right lung cancer with liver mets, GERD, prostate cancer s/p brachytherapy (2019), hearing impairment, anxiety, chronic pain with narcotic dependence.  Pt has significant anxiety with procedures. May need pre medication  Patient diagnosed with lung cancer with femur/liver mets in 2022. He is s/p palliative chemo and radiation. Pt also follows with Palliative care. Having issues with memory loss. Last seen by Oncology on 12/27/23. Last imaging in 11/2023 stable. Advised f/u in 2 months. Cleared for surgery: "I spoke to patient PCP Dr.Jadali-recommend from oncology standpoint to proceed with surgery. Recommend continued surgery for quality of life issues."  Pt follows with Pulmonology for COPD and OSA. Advised continue inhalers. Pt cannot tolerate CPAP. Cleared for surgery: "Your previous congestion issues have resolved, and you are cleared for surgery."  Pt follows with Cardiology for hx of MI and CAD s/p PCI in 2007 with ischemic CM and now recovered EF. Last seen in clinic on 02/11/24. Per NP West: "Preoperative cardiac evaluation: Right hip nail conversion to total hip arthroplasty with Dr. Priscille Brought.  Mr. Gobeil perioperative risk of a major cardiac event is 0.9% according to the Revised Cardiac Risk Index (RCRI). He is at moderate risk for perioperative complications.   His functional capacity is fair at 4.4 METs according to the [facility]  Activity Status Index (DASI). Recommendations:According to ACC/AHA guidelines, no further cardiovascular testing needed.  The patient may proceed to surgery at acceptable risk.  Antiplatelet and/or Anticoagulation Recommendations: Clopidogrel  (Plavix ) can be held for 5 days prior to his surgery and resumed as soon as possible post op.  Ideally patient would start aspirin  81 mg daily while Plavix  is being held and continued through the perioperative period, however if bleeding risk is felt to be too high aspirin  can be held.  If patient is started on aspirin  this can be discontinued when Plavix  is resumed."  LD Plavix : last dose on 02-20-24    (PST was on 02-24-24)  VS: BP 138/84 Comment: right arm sitting  Pulse 68   Temp 36.6 C (Oral)   Resp 18   Ht 5\' 11"  (1.803 m)   Wt 112.5 kg   SpO2 99%   BMI 34.59 kg/m   PROVIDERS: Annelle Kiel, MD   LABS: Labs reviewed: Acceptable for surgery. (all labs ordered are listed, but only abnormal results are displayed)  Labs Reviewed  COMPREHENSIVE METABOLIC PANEL WITH GFR - Abnormal; Notable for the following components:      Result Value   Glucose, Bld 121 (*)    All other components within normal limits  SURGICAL PCR SCREEN  CBC WITH DIFFERENTIAL/PLATELET  TYPE AND SCREEN     IMAGES: CT C/A/P 12/13/23:  IMPRESSION: CHEST:   1. Stable post treatment changes at the RIGHT lung apex. 2. No evidence of thoracic metastasis.   PELVIS:   1. No evidence of metastatic disease in the abdomen  pelvis. 2. No change in adrenal hyperplasia/adrenal adenoma. No specific follow-up recommended. 3. Brachytherapy seeds in the prostate gland. 4.  Aortic Atherosclerosis (ICD10-I70.0).  EKG 02/11/24:  Sinus rhythm with Premature supraventricular complexes, rate 69 Right bundle branch block  CV:  Past Medical History:  Diagnosis Date   Anxiety    associated with medical care,  worsened by difficulty hearing, does better with wife present    Arthritis    Cancer associated pain    Cancer, metastatic to liver [facility])    Complication of anesthesia    wife states very anxious may need pre sedation   COPD (chronic obstructive pulmonary disease) (HCC)    Coronary artery disease    Dyspnea    GERD (gastroesophageal reflux disease)    Hearing loss    mild per wife   Hearing loss    no hearing aids per wife   Hyperlipidemia    Hypertension    MI (myocardial infarction) (HCC) 04/17/2006   Acute inferolateral wall MI with cardiogenic shock and complete heart block   Primary adenocarcinoma of upper lobe of right lung (HCC)    Prostate cancer (HCC)    Sleep apnea    Tobacco abuse    Wears glasses    Wears partial dentures    Upper    Past Surgical History:  Procedure Laterality Date   BRONCHIAL BIOPSY  10/09/2021   Procedure: BRONCHIAL BIOPSIES;  Surgeon: Denson Flake, MD;  Location: [facility];  Service: Pulmonary;;   BRONCHIAL BRUSHINGS  10/09/2021   Procedure: BRONCHIAL BRUSHINGS;  Surgeon: Denson Flake, MD;  Location: [facility] ENDOSCOPY;  Service: Pulmonary;;   BRONCHIAL NEEDLE ASPIRATION BIOPSY  10/09/2021   Procedure: BRONCHIAL NEEDLE ASPIRATION BIOPSIES;  Surgeon: Denson Flake, MD;  Location: [facility];  Service: Pulmonary;;   CARDIAC CATHETERIZATION  2007   left, RCA 100% occluded ruptured plaque with thrombus in the proximal segment   CYST EXCISION N/A 08/30/2021   Procedure: EXCISION OF POSTERIOR SCALP CYST;  Surgeon: Adalberto Acton, MD;  [facility];  Service: General;  Laterality: N/A;   CYSTOSCOPY N/A 07/17/2018   Procedure: Orin Birk;  Surgeon: Trent Frizzle, MD;  Location: [facility];  Service: Urology;  Laterality: N/A;  no seeds in bladder per Dr Joie Narrow   FEMUR IM NAIL Right 04/03/2023   Procedure: REVISION FIXATION OF RIGHT FEMUR NONUNION;  Surgeon: Laneta Pintos, MD;  Location: [facility];  Service: Orthopedics;  Laterality: Right;   FIDUCIAL MARKER PLACEMENT  10/09/2021    Procedure: FIDUCIAL MARKER PLACEMENT;  Surgeon: Denson Flake, MD;  Location: [facility] ENDOSCOPY;  Service: Pulmonary;;   HAND SURGERY Right    has metal plate in arm   HARDWARE REMOVAL Right 04/03/2023   Procedure: REMOVAL OF PREVIOUS HARDWARE FEMUR;  Surgeon: Laneta Pintos, MD;  [facility];  Service: Orthopedics;  Laterality: Right;   PROSTATE BIOPSY     RADIOACTIVE SEED IMPLANT N/A 07/17/2018   Procedure: RADIOACTIVE SEED IMPLANT/BRACHYTHERAPY IMPLANT;  Surgeon: Trent Frizzle, MD;  Location: [facility];  Service: Urology;  Laterality: N/A;  77 seeds   SPACE OAR INSTILLATION N/A 07/17/2018   Procedure: SPACE OAR INSTILLATION;  Surgeon: Trent Frizzle, MD;  Location: [facility];  Service: Urology;  Laterality: N/A;   VIDEO BRONCHOSCOPY WITH RADIAL ENDOBRONCHIAL ULTRASOUND  10/09/2021   Procedure: VIDEO BRONCHOSCOPY WITH RADIAL ENDOBRONCHIAL ULTRASOUND;  Surgeon: Denson Flake, MD;  Location[facility];  Service: Pulmonary;;   WRIST SURGERY  10/04/2012    MEDICATIONS:  albuterol  (PROVENTIL ) (2.5 MG/3ML) 0.083% nebulizer solution   albuterol  (VENTOLIN  HFA) 108 (90 Base) MCG/ACT inhaler   ALPRAZolam  (XANAX ) 0.25 MG tablet   atorvastatin  (LIPITOR) 40 MG tablet   Budeson-Glycopyrrol-Formoterol (BREZTRI  AEROSPHERE) 160-9-4.8 MCG/ACT AERO   clopidogrel  (PLAVIX ) 75 MG tablet   fluticasone  (FLONASE ) 50 MCG/ACT nasal spray   ketoconazole (NIZORAL) 2 % cream   meloxicam  (MOBIC ) 7.5 MG tablet   metoprolol  tartrate (LOPRESSOR ) 25 MG tablet   Nebulizers (COMPRESSOR/NEBULIZER) MISC   oxyCODONE  ER (XTAMPZA  ER) 9 MG C12A   Oxycodone  HCl 10 MG TABS   pantoprazole  (PROTONIX ) 40 MG tablet   sennosides-docusate sodium  (SENOKOT-S) 8.6-50 MG tablet   sertraline  (ZOLOFT ) 100 MG tablet   sertraline  (ZOLOFT ) 100 MG tablet   tamsulosin  (FLOMAX ) 0.4 MG CAPS capsule   Vitamin D , Ergocalciferol , (DRISDOL ) 1.25 MG (50000 UNIT) CAPS capsule   No current  facility-administered medications for this encounter.   Antoinette Kirschner [facility] Short Stay/Anesthesiology [facility] Phone [phone] 02/25/2024 9:42 AM

## 2024-02-25 NOTE — Anesthesia Preprocedure Evaluation (Addendum)
 Anesthesia Evaluation  Patient identified by MRN, date of birth, ID band Patient awake    Reviewed: Allergy & Precautions, NPO status , Patient's Chart, lab work & pertinent test results, reviewed documented beta blocker date and time   History of Anesthesia Complications (+) history of anesthetic complications  Airway Mallampati: III  TM Distance: >3 FB     Dental  (+) Partial Upper, Dental Advisory Given   Pulmonary shortness of breath and with exertion, sleep apnea and Continuous Positive Airway Pressure Ventilation , COPD,  COPD inhaler, Current Smoker and Patient abstained from smoking. Hx/o RUL lung Ca   Pulmonary exam normal breath sounds clear to auscultation       Cardiovascular hypertension, Pt. on medications and Pt. on home beta blockers + CAD and + Past MI  Normal cardiovascular exam Rhythm:Regular Rate:Normal  EKG 02/11/24 Sinus rhythm with Premature supraventricular complexes Right bundle branch block     Neuro/Psych   Anxiety     negative neurological ROS     GI/Hepatic Neg liver ROS,GERD  Medicated,,  Endo/Other  Hyperlipidemia  Renal/GU negative Renal ROS   Hx/o Prostate Ca with mets to bone    Musculoskeletal  (+) Arthritis , Osteoarthritis,  Hx/o Fx right hip- S/P ORIF for non union   Abdominal  (+) + obese  Peds  Hematology   Anesthesia Other Findings   Reproductive/Obstetrics                              Anesthesia Physical Anesthesia Plan  ASA: 3  Anesthesia Plan: Combined Spinal and Epidural and Spinal   Post-op Pain Management: Minimal or no pain anticipated   Induction: Intravenous  PONV Risk Score and Plan: 1 and Treatment may vary due to age or medical condition, Propofol  infusion and Ondansetron   Airway Management Planned: Natural Airway and Simple Face Mask  Additional Equipment: None  Intra-op Plan:   Post-operative Plan:   Informed  Consent: I have reviewed the patients History and Physical, chart, labs and discussed the procedure including the risks, benefits and alternatives for the proposed anesthesia with the patient or authorized representative who has indicated his/her understanding and acceptance.     Dental advisory given  Plan Discussed with: CRNA and Anesthesiologist  Anesthesia Plan Comments: (See PAT note from 4/28. Pt has significant anxiety with procedures. May need pre medication)         Anesthesia Quick Evaluation

## 2024-02-26 ENCOUNTER — Inpatient Hospital Stay (HOSPITAL_COMMUNITY)

## 2024-02-26 ENCOUNTER — Inpatient Hospital Stay (HOSPITAL_COMMUNITY): Payer: Self-pay | Admitting: Medical

## 2024-02-26 ENCOUNTER — Inpatient Hospital Stay (HOSPITAL_COMMUNITY)
Admission: RE | Admit: 2024-02-26 | Discharge: 2024-02-28 | DRG: 522 | Disposition: A | Discharge status: Home Health | Attending: Orthopedic Surgery | Admitting: Orthopedic Surgery

## 2024-02-26 ENCOUNTER — Encounter (HOSPITAL_COMMUNITY): Payer: Self-pay | Admitting: Orthopedic Surgery

## 2024-02-26 ENCOUNTER — Encounter (HOSPITAL_COMMUNITY)
Admission: RE | Disposition: A | Discharge status: Home / Self Care | Payer: Self-pay | Source: Home / Self Care | Attending: Orthopedic Surgery

## 2024-02-26 DIAGNOSIS — I2510 Atherosclerotic heart disease of native coronary artery without angina pectoris: Secondary | ICD-10-CM

## 2024-02-26 DIAGNOSIS — M1990 Unspecified osteoarthritis, unspecified site: Secondary | ICD-10-CM | POA: Diagnosis present

## 2024-02-26 DIAGNOSIS — J449 Chronic obstructive pulmonary disease, unspecified: Secondary | ICD-10-CM | POA: Diagnosis present

## 2024-02-26 DIAGNOSIS — I255 Ischemic cardiomyopathy: Secondary | ICD-10-CM | POA: Diagnosis present

## 2024-02-26 DIAGNOSIS — I442 Atrioventricular block, complete: Secondary | ICD-10-CM | POA: Diagnosis present

## 2024-02-26 DIAGNOSIS — C787 Secondary malignant neoplasm of liver and intrahepatic bile duct: Secondary | ICD-10-CM | POA: Diagnosis present

## 2024-02-26 DIAGNOSIS — Z85118 Personal history of other malignant neoplasm of bronchus and lung: Secondary | ICD-10-CM | POA: Diagnosis not present

## 2024-02-26 DIAGNOSIS — Z9221 Personal history of antineoplastic chemotherapy: Secondary | ICD-10-CM

## 2024-02-26 DIAGNOSIS — Z85828 Personal history of other malignant neoplasm of skin: Secondary | ICD-10-CM

## 2024-02-26 DIAGNOSIS — X58XXXD Exposure to other specified factors, subsequent encounter: Secondary | ICD-10-CM | POA: Diagnosis present

## 2024-02-26 DIAGNOSIS — K219 Gastro-esophageal reflux disease without esophagitis: Secondary | ICD-10-CM | POA: Diagnosis present

## 2024-02-26 DIAGNOSIS — I10 Essential (primary) hypertension: Secondary | ICD-10-CM | POA: Diagnosis present

## 2024-02-26 DIAGNOSIS — T84010A Broken internal right hip prosthesis, initial encounter: Secondary | ICD-10-CM

## 2024-02-26 DIAGNOSIS — Z7902 Long term (current) use of antithrombotics/antiplatelets: Secondary | ICD-10-CM | POA: Diagnosis not present

## 2024-02-26 DIAGNOSIS — I252 Old myocardial infarction: Secondary | ICD-10-CM | POA: Diagnosis not present

## 2024-02-26 DIAGNOSIS — M25551 Pain in right hip: Secondary | ICD-10-CM | POA: Diagnosis present

## 2024-02-26 DIAGNOSIS — G893 Neoplasm related pain (acute) (chronic): Secondary | ICD-10-CM | POA: Diagnosis present

## 2024-02-26 DIAGNOSIS — R413 Other amnesia: Secondary | ICD-10-CM | POA: Diagnosis present

## 2024-02-26 DIAGNOSIS — S7221XA Displaced subtrochanteric fracture of right femur, initial encounter for closed fracture: Secondary | ICD-10-CM

## 2024-02-26 DIAGNOSIS — Z8546 Personal history of malignant neoplasm of prostate: Secondary | ICD-10-CM | POA: Diagnosis not present

## 2024-02-26 DIAGNOSIS — Z7951 Long term (current) use of inhaled steroids: Secondary | ICD-10-CM

## 2024-02-26 DIAGNOSIS — E785 Hyperlipidemia, unspecified: Secondary | ICD-10-CM | POA: Diagnosis present

## 2024-02-26 DIAGNOSIS — S7221XK Displaced subtrochanteric fracture of right femur, subsequent encounter for closed fracture with nonunion: Principal | ICD-10-CM

## 2024-02-26 DIAGNOSIS — I2582 Chronic total occlusion of coronary artery: Secondary | ICD-10-CM | POA: Diagnosis present

## 2024-02-26 DIAGNOSIS — Z9861 Coronary angioplasty status: Secondary | ICD-10-CM

## 2024-02-26 DIAGNOSIS — S72001K Fracture of unspecified part of neck of right femur, subsequent encounter for closed fracture with nonunion: Principal | ICD-10-CM | POA: Diagnosis present

## 2024-02-26 DIAGNOSIS — F419 Anxiety disorder, unspecified: Secondary | ICD-10-CM | POA: Diagnosis present

## 2024-02-26 DIAGNOSIS — G4733 Obstructive sleep apnea (adult) (pediatric): Secondary | ICD-10-CM | POA: Diagnosis present

## 2024-02-26 DIAGNOSIS — Z9109 Other allergy status, other than to drugs and biological substances: Secondary | ICD-10-CM

## 2024-02-26 DIAGNOSIS — F17210 Nicotine dependence, cigarettes, uncomplicated: Secondary | ICD-10-CM | POA: Diagnosis present

## 2024-02-26 DIAGNOSIS — Z79899 Other long term (current) drug therapy: Secondary | ICD-10-CM

## 2024-02-26 HISTORY — PX: CONVERSION TO TOTAL HIP: SHX5784

## 2024-02-26 LAB — TYPE AND SCREEN
ABO/RH(D): A POS
Antibody Screen: NEGATIVE

## 2024-02-26 LAB — ABO/RH: ABO/RH(D): A POS

## 2024-02-26 SURGERY — CONVERSION, PREVIOUS HIP SURGERY, TO TOTAL HIP ARTHROPLASTY
Anesthesia: Spinal | Site: Hip | Laterality: Right

## 2024-02-26 MED ORDER — SODIUM CHLORIDE (PF) 0.9 % IJ SOLN
INTRAMUSCULAR | Status: DC | PRN
  Administered 2024-02-26: 80 mL

## 2024-02-26 MED ORDER — ONDANSETRON HCL 4 MG/2ML IJ SOLN: 4.0000 mg | Freq: Four times a day (QID) | INTRAMUSCULAR | Status: DC | PRN

## 2024-02-26 MED ORDER — TRANEXAMIC ACID-NACL 1000-0.7 MG/100ML-% IV SOLN
1000.0000 mg | INTRAVENOUS | Status: AC
  Administered 2024-02-26: 1000 mg via INTRAVENOUS
  Filled 2024-02-26: qty 100

## 2024-02-26 MED ORDER — ASPIRIN 81 MG PO TBEC
81.0000 mg | DELAYED_RELEASE_TABLET | Freq: Two times a day (BID) | ORAL | Status: DC
  Administered 2024-02-27 – 2024-02-28 (×3): 81 mg via ORAL
  Filled 2024-02-26 (×3): qty 1

## 2024-02-26 MED ORDER — CLOPIDOGREL BISULFATE 75 MG PO TABS: 75.0000 mg | ORAL_TABLET | Freq: Every day | ORAL | Status: DC

## 2024-02-26 MED ORDER — PROPOFOL 10 MG/ML IV BOLUS
INTRAVENOUS | Status: AC
  Filled 2024-02-26: qty 20

## 2024-02-26 MED ORDER — CLOPIDOGREL BISULFATE 75 MG PO TABS: 75.0000 mg | ORAL_TABLET | Freq: Every day | ORAL | Status: AC

## 2024-02-26 MED ORDER — ORAL CARE MOUTH RINSE: 15.0000 mL | Freq: Once | OROMUCOSAL | Status: AC

## 2024-02-26 MED ORDER — ACETAMINOPHEN 500 MG PO TABS
1000.0000 mg | ORAL_TABLET | Freq: Once | ORAL | Status: AC
  Administered 2024-02-26: 1000 mg via ORAL
  Filled 2024-02-26: qty 2

## 2024-02-26 MED ORDER — ACETAMINOPHEN 500 MG PO TABS: 1000.0000 mg | ORAL_TABLET | Freq: Three times a day (TID) | ORAL | Status: AC | PRN

## 2024-02-26 MED ORDER — SODIUM CHLORIDE 0.9 % IR SOLN
Status: DC | PRN
  Administered 2024-02-26: 3250 mL

## 2024-02-26 MED ORDER — DEXAMETHASONE SODIUM PHOSPHATE 10 MG/ML IJ SOLN
8.0000 mg | Freq: Once | INTRAMUSCULAR | Status: AC
  Administered 2024-02-26: 8 mg via INTRAVENOUS

## 2024-02-26 MED ORDER — PHENYLEPHRINE HCL-NACL 20-0.9 MG/250ML-% IV SOLN
INTRAVENOUS | Status: AC
  Filled 2024-02-26: qty 250

## 2024-02-26 MED ORDER — MENTHOL 3 MG MT LOZG: 1.0000 | LOZENGE | OROMUCOSAL | Status: DC | PRN

## 2024-02-26 MED ORDER — POLYETHYLENE GLYCOL 3350 17 G PO PACK: 17.0000 g | PACK | Freq: Every day | ORAL | Status: DC | PRN

## 2024-02-26 MED ORDER — EPHEDRINE SULFATE (PRESSORS) 50 MG/ML IJ SOLN
INTRAMUSCULAR | Status: DC | PRN
Start: 2024-02-26 — End: 2024-02-26
  Administered 2024-02-26 (×5): 5 mg via INTRAVENOUS

## 2024-02-26 MED ORDER — LACTATED RINGERS IV SOLN: INTRAVENOUS | Status: DC

## 2024-02-26 MED ORDER — KETOROLAC TROMETHAMINE 15 MG/ML IJ SOLN
7.5000 mg | Freq: Four times a day (QID) | INTRAMUSCULAR | Status: AC
  Administered 2024-02-27 (×4): 7.5 mg via INTRAVENOUS
  Filled 2024-02-26 (×4): qty 1

## 2024-02-26 MED ORDER — CEFAZOLIN SODIUM-DEXTROSE 2-3 GM-%(50ML) IV SOLR
INTRAVENOUS | Status: DC | PRN
  Administered 2024-02-26: 2 g via INTRAVENOUS

## 2024-02-26 MED ORDER — PHENOL 1.4 % MT LIQD: 1.0000 | OROMUCOSAL | Status: DC | PRN

## 2024-02-26 MED ORDER — FENTANYL CITRATE (PF) 100 MCG/2ML IJ SOLN
INTRAMUSCULAR | Status: DC | PRN
Start: 2024-02-26 — End: 2024-02-26
  Administered 2024-02-26: 50 ug via INTRAVENOUS

## 2024-02-26 MED ORDER — WATER FOR IRRIGATION, STERILE IR SOLN
Status: DC | PRN
  Administered 2024-02-26: 1000 mL

## 2024-02-26 MED ORDER — TAMSULOSIN HCL 0.4 MG PO CAPS
0.4000 mg | ORAL_CAPSULE | Freq: Every day | ORAL | Status: DC
  Administered 2024-02-27 – 2024-02-28 (×2): 0.4 mg via ORAL
  Filled 2024-02-26 (×2): qty 1

## 2024-02-26 MED ORDER — ONDANSETRON HCL 4 MG PO TABS: 4.0000 mg | ORAL_TABLET | Freq: Three times a day (TID) | ORAL | 0 refills | Status: AC | PRN

## 2024-02-26 MED ORDER — SENNA 8.6 MG PO TABS
1.0000 | ORAL_TABLET | Freq: Two times a day (BID) | ORAL | Status: DC
  Administered 2024-02-26 – 2024-02-28 (×4): 8.6 mg via ORAL
  Filled 2024-02-26 (×4): qty 1

## 2024-02-26 MED ORDER — BUPIVACAINE-EPINEPHRINE (PF) 0.25% -1:200000 IJ SOLN
INTRAMUSCULAR | Status: AC
  Filled 2024-02-26: qty 30

## 2024-02-26 MED ORDER — BUPIVACAINE LIPOSOME 1.3 % IJ SUSP
INTRAMUSCULAR | Status: AC
  Filled 2024-02-26: qty 20

## 2024-02-26 MED ORDER — ONDANSETRON HCL 4 MG/2ML IJ SOLN: 4.0000 mg | Freq: Once | INTRAMUSCULAR | Status: DC | PRN

## 2024-02-26 MED ORDER — 0.9 % SODIUM CHLORIDE (POUR BTL) OPTIME
TOPICAL | Status: DC | PRN
  Administered 2024-02-26: 1000 mL

## 2024-02-26 MED ORDER — SERTRALINE HCL 50 MG PO TABS
50.0000 mg | ORAL_TABLET | Freq: Every day | ORAL | Status: DC
  Administered 2024-02-27 – 2024-02-28 (×2): 50 mg via ORAL
  Filled 2024-02-26 (×2): qty 1

## 2024-02-26 MED ORDER — PHENYLEPHRINE HCL (PRESSORS) 10 MG/ML IV SOLN
INTRAVENOUS | Status: DC | PRN
Start: 2024-02-26 — End: 2024-02-26
  Administered 2024-02-26: 80 ug via INTRAVENOUS

## 2024-02-26 MED ORDER — CEFAZOLIN SODIUM-DEXTROSE 2-4 GM/100ML-% IV SOLN
2.0000 g | INTRAVENOUS | Status: DC
  Filled 2024-02-26: qty 100

## 2024-02-26 MED ORDER — LIDOCAINE-EPINEPHRINE 2 %-1:100000 IJ SOLN
INTRAMUSCULAR | Status: DC | PRN
  Administered 2024-02-26: 5 mL via INTRADERMAL

## 2024-02-26 MED ORDER — MIDAZOLAM HCL 5 MG/5ML IJ SOLN
INTRAMUSCULAR | Status: DC | PRN
  Administered 2024-02-26: 2 mg via INTRAVENOUS

## 2024-02-26 MED ORDER — ZOLPIDEM TARTRATE 5 MG PO TABS: 5.0000 mg | ORAL_TABLET | Freq: Every evening | ORAL | Status: DC | PRN

## 2024-02-26 MED ORDER — BUPIVACAINE IN DEXTROSE 0.75-8.25 % IT SOLN
INTRATHECAL | Status: DC | PRN
  Administered 2024-02-26: 2 mL via INTRATHECAL

## 2024-02-26 MED ORDER — OXYCODONE HCL 5 MG PO TABS: 5.0000 mg | ORAL_TABLET | Freq: Once | ORAL | Status: DC | PRN

## 2024-02-26 MED ORDER — ISOPROPYL ALCOHOL 70 % SOLN
Status: DC | PRN
  Administered 2024-02-26: 1 via TOPICAL

## 2024-02-26 MED ORDER — SODIUM CHLORIDE 0.9 % IV SOLN: INTRAVENOUS | Status: DC

## 2024-02-26 MED ORDER — METHOCARBAMOL 1000 MG/10ML IJ SOLN: 500.0000 mg | Freq: Four times a day (QID) | INTRAMUSCULAR | Status: DC | PRN

## 2024-02-26 MED ORDER — PANTOPRAZOLE SODIUM 40 MG PO TBEC
40.0000 mg | DELAYED_RELEASE_TABLET | Freq: Every day | ORAL | Status: DC
  Administered 2024-02-26 – 2024-02-28 (×3): 40 mg via ORAL
  Filled 2024-02-26 (×3): qty 1

## 2024-02-26 MED ORDER — DIPHENHYDRAMINE HCL 12.5 MG/5ML PO ELIX
12.5000 mg | ORAL_SOLUTION | ORAL | Status: DC | PRN
  Administered 2024-02-27: 25 mg via ORAL
  Filled 2024-02-26: qty 10

## 2024-02-26 MED ORDER — ACETAMINOPHEN 325 MG PO TABS
325.0000 mg | ORAL_TABLET | Freq: Four times a day (QID) | ORAL | Status: DC | PRN
  Administered 2024-02-28: 650 mg via ORAL
  Filled 2024-02-26: qty 2

## 2024-02-26 MED ORDER — DROPERIDOL 2.5 MG/ML IJ SOLN: 0.6250 mg | Freq: Once | INTRAMUSCULAR | Status: DC | PRN

## 2024-02-26 MED ORDER — ALBUTEROL SULFATE (2.5 MG/3ML) 0.083% IN NEBU: 2.5000 mg | INHALATION_SOLUTION | Freq: Four times a day (QID) | RESPIRATORY_TRACT | Status: DC | PRN

## 2024-02-26 MED ORDER — HYDROMORPHONE HCL 1 MG/ML IJ SOLN: 0.5000 mg | INTRAMUSCULAR | Status: DC | PRN

## 2024-02-26 MED ORDER — GLYCOPYRROLATE 0.2 MG/ML IJ SOLN
INTRAMUSCULAR | Status: DC | PRN
Start: 2024-02-26 — End: 2024-02-26
  Administered 2024-02-26: .2 mg via INTRAVENOUS

## 2024-02-26 MED ORDER — BUPIVACAINE LIPOSOME 1.3 % IJ SUSP: 10.0000 mL | Freq: Once | INTRAMUSCULAR | Status: DC

## 2024-02-26 MED ORDER — CEFADROXIL 500 MG PO CAPS: 500.0000 mg | ORAL_CAPSULE | Freq: Two times a day (BID) | ORAL | Status: DC

## 2024-02-26 MED ORDER — ONDANSETRON HCL 4 MG/2ML IJ SOLN
INTRAMUSCULAR | Status: DC | PRN
  Administered 2024-02-26: 4 mg via INTRAVENOUS

## 2024-02-26 MED ORDER — LIDOCAINE-EPINEPHRINE 2 %-1:100000 IJ SOLN
INTRAMUSCULAR | Status: AC
  Filled 2024-02-26: qty 1

## 2024-02-26 MED ORDER — METHOCARBAMOL 500 MG PO TABS
500.0000 mg | ORAL_TABLET | Freq: Four times a day (QID) | ORAL | Status: DC | PRN
  Administered 2024-02-26 – 2024-02-28 (×4): 500 mg via ORAL
  Filled 2024-02-26 (×4): qty 1

## 2024-02-26 MED ORDER — HYDROMORPHONE HCL 1 MG/ML IJ SOLN: 0.2500 mg | INTRAMUSCULAR | Status: DC | PRN

## 2024-02-26 MED ORDER — ASPIRIN 81 MG PO CHEW: 81.0000 mg | CHEWABLE_TABLET | Freq: Two times a day (BID) | ORAL | Status: DC

## 2024-02-26 MED ORDER — OXYCODONE HCL 5 MG/5ML PO SOLN: 5.0000 mg | Freq: Once | ORAL | Status: DC | PRN

## 2024-02-26 MED ORDER — KETAMINE HCL 50 MG/5ML IJ SOSY
PREFILLED_SYRINGE | INTRAMUSCULAR | Status: AC
  Filled 2024-02-26: qty 5

## 2024-02-26 MED ORDER — POVIDONE-IODINE 10 % EX SWAB: 2.0000 | Freq: Once | CUTANEOUS | Status: DC

## 2024-02-26 MED ORDER — ACETAMINOPHEN 500 MG PO TABS
1000.0000 mg | ORAL_TABLET | Freq: Four times a day (QID) | ORAL | Status: AC
  Administered 2024-02-27 (×4): 1000 mg via ORAL
  Filled 2024-02-26 (×4): qty 2

## 2024-02-26 MED ORDER — POLYETHYLENE GLYCOL 3350 17 G PO PACK: 17.0000 g | PACK | Freq: Every day | ORAL | 0 refills | Status: DC

## 2024-02-26 MED ORDER — OXYCODONE HCL 5 MG PO TABS
10.0000 mg | ORAL_TABLET | ORAL | Status: DC | PRN
  Administered 2024-02-26: 10 mg via ORAL
  Administered 2024-02-27 – 2024-02-28 (×7): 15 mg via ORAL
  Filled 2024-02-26 (×5): qty 3
  Filled 2024-02-26: qty 2
  Filled 2024-02-26 (×2): qty 3

## 2024-02-26 MED ORDER — CHLORHEXIDINE GLUCONATE 0.12 % MT SOLN
15.0000 mL | Freq: Once | OROMUCOSAL | Status: AC
  Administered 2024-02-26: 15 mL via OROMUCOSAL

## 2024-02-26 MED ORDER — ONDANSETRON HCL 4 MG PO TABS: 4.0000 mg | ORAL_TABLET | Freq: Four times a day (QID) | ORAL | Status: DC | PRN

## 2024-02-26 MED ORDER — OXYCODONE HCL 5 MG PO TABS: 2.5000 mg | ORAL_TABLET | ORAL | 0 refills | Status: AC | PRN

## 2024-02-26 MED ORDER — ALPRAZOLAM 0.25 MG PO TABS
0.2500 mg | ORAL_TABLET | Freq: Every day | ORAL | Status: DC
  Administered 2024-02-26 – 2024-02-27 (×2): 0.25 mg via ORAL
  Filled 2024-02-26 (×2): qty 1

## 2024-02-26 MED ORDER — FLUTICASONE PROPIONATE 50 MCG/ACT NA SUSP: 1.0000 | Freq: Every day | NASAL | Status: DC | PRN

## 2024-02-26 MED ORDER — FENTANYL CITRATE (PF) 100 MCG/2ML IJ SOLN
INTRAMUSCULAR | Status: AC
  Filled 2024-02-26: qty 2

## 2024-02-26 MED ORDER — PROPOFOL 500 MG/50ML IV EMUL
INTRAVENOUS | Status: DC | PRN
  Administered 2024-02-26: 100 ug/kg/min via INTRAVENOUS

## 2024-02-26 MED ORDER — ASPIRIN 81 MG PO TBEC
81.0000 mg | DELAYED_RELEASE_TABLET | Freq: Two times a day (BID) | ORAL | Status: AC
Start: 2024-02-27 — End: 2024-03-26

## 2024-02-26 MED ORDER — ATORVASTATIN CALCIUM 40 MG PO TABS
40.0000 mg | ORAL_TABLET | Freq: Every day | ORAL | Status: DC
  Administered 2024-02-27 – 2024-02-28 (×2): 40 mg via ORAL
  Filled 2024-02-26 (×2): qty 1

## 2024-02-26 MED ORDER — BUDESON-GLYCOPYRROL-FORMOTEROL 160-9-4.8 MCG/ACT IN AERO
2.0000 | INHALATION_SPRAY | Freq: Two times a day (BID) | RESPIRATORY_TRACT | Status: DC
  Administered 2024-02-27 (×2): 2 via RESPIRATORY_TRACT
  Filled 2024-02-26: qty 5.9

## 2024-02-26 MED ORDER — METOPROLOL TARTRATE 25 MG PO TABS
25.0000 mg | ORAL_TABLET | Freq: Every day | ORAL | Status: DC
  Administered 2024-02-27 – 2024-02-28 (×2): 25 mg via ORAL
  Filled 2024-02-26 (×2): qty 1

## 2024-02-26 MED ORDER — CEFAZOLIN SODIUM-DEXTROSE 2-4 GM/100ML-% IV SOLN
2.0000 g | Freq: Three times a day (TID) | INTRAVENOUS | Status: DC
  Administered 2024-02-26 – 2024-02-28 (×5): 2 g via INTRAVENOUS
  Filled 2024-02-26 (×5): qty 100

## 2024-02-26 MED ORDER — LACTATED RINGERS IV SOLN
INTRAVENOUS | Status: DC | PRN
Start: 2024-02-26 — End: 2024-02-26

## 2024-02-26 MED ORDER — MIDAZOLAM HCL 2 MG/2ML IJ SOLN
INTRAMUSCULAR | Status: AC
  Filled 2024-02-26: qty 2

## 2024-02-26 MED ORDER — METHOCARBAMOL 500 MG PO TABS: 500.0000 mg | ORAL_TABLET | Freq: Three times a day (TID) | ORAL | 0 refills | Status: AC | PRN

## 2024-02-26 MED ORDER — ALBUMIN HUMAN 5 % IV SOLN: INTRAVENOUS | Status: DC | PRN

## 2024-02-26 MED ORDER — KETAMINE HCL 10 MG/ML IJ SOLN
INTRAMUSCULAR | Status: DC | PRN
  Administered 2024-02-26 (×2): 20 mg via INTRAVENOUS

## 2024-02-26 MED ORDER — LIDOCAINE HCL (CARDIAC) PF 100 MG/5ML IV SOSY
PREFILLED_SYRINGE | INTRAVENOUS | Status: DC | PRN
  Administered 2024-02-26: 20 mg via INTRATRACHEAL

## 2024-02-26 MED ORDER — SODIUM CHLORIDE (PF) 0.9 % IJ SOLN
INTRAMUSCULAR | Status: AC
  Filled 2024-02-26: qty 30

## 2024-02-26 MED ORDER — CEFADROXIL 500 MG PO CAPS: 500.0000 mg | ORAL_CAPSULE | Freq: Two times a day (BID) | ORAL | 0 refills | Status: AC

## 2024-02-26 MED ORDER — DOCUSATE SODIUM 100 MG PO CAPS
100.0000 mg | ORAL_CAPSULE | Freq: Two times a day (BID) | ORAL | Status: DC
  Administered 2024-02-26 – 2024-02-28 (×4): 100 mg via ORAL
  Filled 2024-02-26 (×4): qty 1

## 2024-02-26 MED ORDER — PHENYLEPHRINE HCL-NACL 20-0.9 MG/250ML-% IV SOLN
INTRAVENOUS | Status: DC | PRN
  Administered 2024-02-26: 25 ug/min via INTRAVENOUS

## 2024-02-26 SURGICAL SUPPLY — 79 items
BAG COUNTER SPONGE SURGICOUNT (BAG) IMPLANT
BAG ZIPLOCK 12X15 (MISCELLANEOUS) ×2 IMPLANT
BIT DRILL SHORT 4.0 (BIT) ×1 IMPLANT
BLADE SAW SAG 25X90X1.19 (BLADE) IMPLANT
BLADE SAW SGTL 81X20 HD (BLADE) IMPLANT
BRUSH FEMORAL CANAL (MISCELLANEOUS) ×1 IMPLANT
CEMENT BONE SIMPLEX SPEEDSET (Cement) ×2 IMPLANT
CEMENT RESTRICTOR BONE PREP ST (Cement) ×1 IMPLANT
CHLORAPREP W/TINT 26 (MISCELLANEOUS) ×3 IMPLANT
CNTNR URN SCR LID CUP LEK RST (MISCELLANEOUS) IMPLANT
COMP FEM PROX STD HIP GMRS (Joint) ×1 IMPLANT
COVER SURGICAL LIGHT HANDLE (MISCELLANEOUS) ×2 IMPLANT
DERMABOND ADVANCED .7 DNX12 (GAUZE/BANDAGES/DRESSINGS) ×2 IMPLANT
DRAPE C-ARM 42X120 X-RAY (DRAPES) ×1 IMPLANT
DRAPE C-ARMOR (DRAPES) ×1 IMPLANT
DRAPE HIP W/POCKET STRL (MISCELLANEOUS) ×2 IMPLANT
DRAPE INCISE IOBAN 66X45 STRL (DRAPES) ×1 IMPLANT
DRAPE INCISE IOBAN 85X60 (DRAPES) ×2 IMPLANT
DRAPE POUCH INSTRU U-SHP 10X18 (DRAPES) ×2 IMPLANT
DRAPE SHEET LG 3/4 BI-LAMINATE (DRAPES) ×6 IMPLANT
DRAPE U-SHAPE 47X51 STRL (DRAPES) ×4 IMPLANT
DRESSING MEPILEX FLEX 4X4 (GAUZE/BANDAGES/DRESSINGS) ×2 IMPLANT
DRSG AQUACEL AG ADV 3.5X10 (GAUZE/BANDAGES/DRESSINGS) IMPLANT
DRSG AQUACEL AG ADV 3.5X14 (GAUZE/BANDAGES/DRESSINGS) ×1 IMPLANT
ELECT BLADE TIP CTD 4 INCH (ELECTRODE) ×2 IMPLANT
ELECT NDL TIP 2.8 STRL (NEEDLE) IMPLANT
ELECT NEEDLE TIP 2.8 STRL (NEEDLE) IMPLANT
ELECT PENCIL ROCKER SW 15FT (MISCELLANEOUS) ×1 IMPLANT
ELECT REM PT RETURN 15FT ADLT (MISCELLANEOUS) ×2 IMPLANT
FACESHIELD WRAPAROUND (MASK) ×2 IMPLANT
FACESHIELD WRAPAROUND OR TEAM (MASK) IMPLANT
GAUZE SPONGE 4X4 12PLY STRL LF (GAUZE/BANDAGES/DRESSINGS) ×2 IMPLANT
GLOVE BIO SURGEON STRL SZ 6.5 (GLOVE) ×4 IMPLANT
GLOVE BIOGEL PI IND STRL 6.5 (GLOVE) ×2 IMPLANT
GLOVE BIOGEL PI IND STRL 8 (GLOVE) ×2 IMPLANT
GLOVE SURG ORTHO 8.0 STRL STRW (GLOVE) ×4 IMPLANT
GOWN STRL REUS W/ TWL XL LVL3 (GOWN DISPOSABLE) ×4 IMPLANT
HEAD ACET UNI HIP BP 53X28 (Head) ×1 IMPLANT
HEAD CERAMIC V40 BIOLOX DEL 28 (Orthopedic Implant) ×1 IMPLANT
HOLDER FOLEY CATH W/STRAP (MISCELLANEOUS) ×2 IMPLANT
HOOD PEEL AWAY T7 (MISCELLANEOUS) ×6 IMPLANT
KIT BASIN OR (CUSTOM PROCEDURE TRAY) ×2 IMPLANT
KIT TURNOVER KIT A (KITS) ×1 IMPLANT
MANIFOLD NEPTUNE II (INSTRUMENTS) ×2 IMPLANT
MARKER SKIN DUAL TIP RULER LAB (MISCELLANEOUS) ×2 IMPLANT
NDL SAFETY ECLIPSE 18X1.5 (NEEDLE) ×2 IMPLANT
NS IRRIG 1000ML POUR BTL (IV SOLUTION) ×2 IMPLANT
PACK TOTAL JOINT (CUSTOM PROCEDURE TRAY) ×2 IMPLANT
PAD ARMBOARD POSITIONER FOAM (MISCELLANEOUS) ×2 IMPLANT
PROTECTOR NERVE ULNAR (MISCELLANEOUS) ×2 IMPLANT
RETRIEVER SUT HEWSON (MISCELLANEOUS) ×1 IMPLANT
SEALER BIPOLAR AQUA 6.0 (INSTRUMENTS) ×1 IMPLANT
SET HNDPC FAN SPRY TIP SCT (DISPOSABLE) ×2 IMPLANT
SOLUTION PRONTOSAN WOUND 350ML (IRRIGATION / IRRIGATOR) IMPLANT
SPIKE FLUID TRANSFER (MISCELLANEOUS) ×3 IMPLANT
STEM FEM EXT HIP CMT 13X127 (Stem) ×1 IMPLANT
STEM FEM EXT HIP REV GMRS 40 (Stem) ×1 IMPLANT
SUCTION TUBE FRAZIER 12FR DISP (SUCTIONS) ×2 IMPLANT
SUT BONE WAX W31G (SUTURE) IMPLANT
SUT ETHIBOND #5 BRAIDED 30INL (SUTURE) ×2 IMPLANT
SUT ETHILON 3 0 PS 1 (SUTURE) ×2 IMPLANT
SUT MNCRL AB 3-0 PS2 18 (SUTURE) ×2 IMPLANT
SUT STRATAFIX 14 PDO 48 VLT (SUTURE) ×2 IMPLANT
SUT STRATAFIX PDO 1 14 VIOLET (SUTURE) ×2 IMPLANT
SUT VIC AB 0 CT1 36 (SUTURE) ×2 IMPLANT
SUT VIC AB 2-0 CT2 27 (SUTURE) ×5 IMPLANT
SUTURE 2 FIBERLOOP 20 STRT BLU (SUTURE) ×1 IMPLANT
SUTURE STRATFX 0 PDS 27 VIOLET (SUTURE) ×2 IMPLANT
SUTURE TAPE 1.3 40 TPR END (SUTURE) ×1 IMPLANT
SYR 30ML LL (SYRINGE) ×3 IMPLANT
SYR 50ML LL SCALE MARK (SYRINGE) ×2 IMPLANT
TIP HIGH FLOW IRRIGATION COAX (MISCELLANEOUS) ×1 IMPLANT
TOWEL GREEN STERILE FF (TOWEL DISPOSABLE) ×2 IMPLANT
TOWEL OR 17X26 10 PK STRL BLUE (TOWEL DISPOSABLE) ×2 IMPLANT
TOWER CARTRIDGE SMART MIX (DISPOSABLE) ×1 IMPLANT
TRAY FOLEY MTR SLVR 16FR STAT (SET/KITS/TRAYS/PACK) ×2 IMPLANT
TUBE SUCTION HIGH CAP CLEAR NV (SUCTIONS) ×2 IMPLANT
UNDERPAD 30X36 HEAVY ABSORB (UNDERPADS AND DIAPERS) ×2 IMPLANT
WATER STERILE IRR 1000ML POUR (IV SOLUTION) ×4 IMPLANT

## 2024-02-26 NOTE — Op Note (Signed)
 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 male who has a diagnosis of Right subtrochanteric fracture nonunion of pathologic fracture from suspected lung metastasis.he had initially underwent prophylactic fixation of a stress fracture in his proximal femur.  This went on to nonunion and failure of the nail.  Subsequently went underwent a revision of the broken nail to a new nail with Dr. Curtiss Dowdy.  This was on 04/03/2023.  This also went on to nonunion and just recently the nail broke as well.  At this point felt he would benefit from resection of the proximal femur including the tumor region with conversion to a hip hemiarthroplasty.   The risks benefits and alternatives were discussed with the patient including but not limited to the risks of nonoperative treatment, versus surgical intervention including infection, bleeding, nerve injury, periprosthetic fracture, the need for revision surgery, dislocation, leg length discrepancy, blood clots, cardiopulmonary complications, morbidity, mortality, among others, and they were willing to proceed.     OPERATIVE REPORT     SURGEON:  Priscille Brought, MD    ASSISTANT: Mason Sole, PA-C, (Present throughout the entire procedure,  necessary for completion of procedure in a timely manner, assisting with retraction, instrumentation, and closure)     ANESTHESIA: Spinal  ESTIMATED BLOOD LOSS: 300cc    COMPLICATIONS:  None.       COMPONENTS:   Implant Name Type Inv. Item Serial No. Manufacturer Lot No. LRB No. Used Action  CEMENT RESTRICTOR BONE PREP ST - [id number] Cement CEMENT  RESTRICTOR BONE PREP ST  [employer] [id number] Right 1 Implanted  CEMENT BONE SIMPLEX SPEEDSET - [id number] Cement CEMENT BONE SIMPLEX SPEEDSET  [employer] [id number] Right 1 Implanted  CEMENT BONE SIMPLEX SPEEDSET - [id number] Cement CEMENT BONE SIMPLEX SPEEDSET  [employer] [id number] Right 1 Implanted  NAIL IM CANN 11.5X380 130D - [id number] Nail NAIL IM CANN 11.5X380 130D  [employer] [id number] Right 1 Explanted  SCREW TRIGEN LOW PROF 5.0X45 - [id number] Screw SCREW TRIGEN LOW PROF 5.0X45  [employer] [id number] Right 1 Explanted  SCREW LAG COMPR KIT 100/95 - [id number] Screw SCREW LAG COMPR KIT 100/95  [employer] [id number] Right 1 Explanted  Straight Cemented Stem    [employer] [id number] Right 1 Implanted  Proximal Femoral Component    [employer] [id number] Right 1 Implanted  Extension Piece   ATYDU [employer]  Right 1 Implanted  HEAD CERAMIC V40 BIOLOX DEL 28 - [id number] Orthopedic Implant HEAD CERAMIC V40 BIOLOX DEL 28  [employer] [id number] Right 1 Implanted  HEAD ACET UNI HIP BP 53X28 - [id number] Head HEAD ACET UNI HIP BP 53X28  [employer] [id number] Right 1 Implanted    The aquamantis was utilized for this case to help facilitate better hemostasis as patient was felt to be at increased risk of bleeding because of complex case requiring increased OR time and/or exposure.       PROCEDURE IN DETAIL:  The patient was met in the holding area and  identified.  The appropriate hip was identified and marked at the operative site.  The patient was then transported to the OR  and  placed under  anesthesia.  At that point, the patient was  placed in the lateral decubitus position with the operative side up and  secured to the operating room table  and all bony prominences padded. A subaxillary role was also placed.    The operative lower extremity was prepped from the iliac crest to the distal  leg.  Sterile draping was performed.  Preoperative antibiotics, 2 gm of ancef ,1 gm of Tranexamic Acid , and 8 mg of Decadron  administered. Time out was performed prior to incision.      An extended posterolateral approach was utilized via sharp dissection  carried down to the subcutaneous tissue.  Gross bleeders were Bovie coagulated.  The iliotibial band was identified and incised along the length of the skin incision through the glute max fascia.  Charnley retractor was placed with care to protect the sciatic nerve posteriorly.  With the hip internally rotated, the piriformis tendon was identified and released from the femoral insertion and tagged with a #5 Ethibond.  A capsulotomy was then performed off the femoral insertion and also tagged with a #5 Ethibond.    The hip was then carefully dislocated and we reduced.  I then measured my approximate resection length from the greater trochanter to hip to approximately 11 cm down along the proximal femur which was about 2 cm distal to the old fracture.  I next turned my attention to performing the trochanteric osteotomy to keep the abductors and vastus lateralis attached to bone for repair after the proximal femur replacement.  Using an ACL blade and an osteotome a osteotomy was performed of the greater trochanter retaining attachments to the abductors and vastus lateralis.  This osteotomy fragment was translated anteriorly.  This exposed the proximal aspect of the cephalomedullary nail.  The proximal part of the screw was broken and removed.  The distal cephalomedullary screw was also broken and removed.  Using the appropriate screwdriver the superior screw was able to be removed without difficulty.  At this point the nonunion site of the subtrochanteric fracture was identified there was no healing or callus formation noted at the fracture site.  We next turned our attention to removing the distal interlocks so that we could remove the nail.  The distal interlock  screw was removed without difficulty however the proximal screw was broken and most of the screw was stuck within the nail so that the nail could not be extracted proximally.  We used a drill bit to push to the remaining screws through the nail and bone.  This was done under fluoroscopy.  Once the screw was pushed through the nail was removed without difficulty.  With the hip externally rotated and using fluoroscopy a small 1 cm incision was made over the medial thigh.  Bluntly dissected down to the medial femur.  The screw tip could be felt and it was removed with a tonsil.  Next we turned our attention to removing the proximal femur.  A osteotomy was performed at the previously marked spot 11 cm from the tip of the distal greater trochanter.  The segment of bone distal to the fracture was able to be easily excised.  We then worked circumferentially around the proximal femur to detach the gluteus maximus, iliopsoas, and capsular attachments of the proximal femur directly off the bone.  No significant bleeding was encountered.  At this point the proximal femur was able to be easily excised.  The head ball measured about 52 mm.  We utilized the bipolar head trials and  felt a 53 mm head trial had a good fit in the acetabulum.  Acetabulum was also assessed and found to have no significant significant wear and to be in good condition, thus we elected to proceed with hemiarthroplasty.  We also utilized the excised piece of proximal femur to approximate our length for the proximal femur replacement.  A 40 mm segment was felt to restore the appropriate length.  And the offset was felt to be restored with a +0 head ball.  We next turned our attention to reaming the femoral canal.  We reamed up to a 15 mm reamer which had good cortical reamings.  This would allow for a 1 mm cement mantle with a 13 mm diameter stem.  A 13 x 127 mm stem trial with proximal body and bipolar head was then inserted and reduced.  We were  pleased with the restoration of leg length and stability.  Fluoroscopy also confirmed appropriate component position.   We then turned our attention to preparation of the canal for cementation.  A cement restrictor was placed distally. The canal was then irrigated with the pulse lavage and 3 L of normal saline.  2 bags of Simplex cement were prepared.  Using the cement gun the cement was inserted distally and the canal was filled.  We then pressurized the canal. The real implant was then inserted matching the patient's native anteversion of approximately 30 degrees.  We then waited for 13 minutes for the cement to be fully set.  Excess cement was removed.  A lap was placed in the acetabulum prior to cementing was also removed and the acetabulum was assessed to make sure there was no cement or bone fragments.  I again trialed and selected a 53+ 0mm bipolar Hemi head ball. The hip was then reduced and taken through a range of motion. There was no impingement with full extension and 90 degrees external rotation.  The hip was stable at the position of sleep and with 90 degrees flexion and 70 degrees of internal rotation. Leg lengths were  again assessed and felt to be restored.  We then opened, and I impacted the real head ball into place.  The posterior capsule was then closed with #5 Ethibond.  The trochanteric osteotomy was reduced to the lateral shoulder of the implant.  Utilizing fiber tape and fiber loop this was passed circumferentially around the trochanter segment and through the holes of the lateral shoulder utilizing a Keith needle these were tensioned and tied to secure the trochanter fragment.  The gluteus maximus tendon was also identified and repaired around the femoral stem utilizing a #5 Ethibond.    I then irrigated the hip copiously with Irrisept irrigation and with normal saline pulse lavage. Periarticular injection was then performed with Exparel .   We repaired the fascia #1 barbed suture,  followed by 0 barbed suture for the subcutaneous fat.  Skin was closed with 2-0 Vicryl and 3-0 Monocryl.  The distal incisions from the interlock removal were irrigated and closed with 2-0 Vicryl and 3-0 nylon.  Dermabond and Aquacel dressing were applied. The patient was then awakened and returned to PACU in stable and satisfactory condition.  Leg lengths in the supine position were assessed and felt to be clinically equal. There were no complications.  Post op recs: WB: WBAT RLE, 6 weeks posterior hip precautions Abx: ancef  Imaging: PACU pelvis Xray Dressing: Aquacell, keep intact until follow up DVT prophylaxis: Aspirin  81BID starting POD1 Follow up: 2 weeks after  surgery for a wound check with Dr. Pryor Browning at [facility].  Address: [address], [place], [place] [address]  Office Phone: [phone]   Priscille Brought, MD Orthopedic Surgeon

## 2024-02-26 NOTE — Discharge Instructions (Addendum)
 INSTRUCTIONS AFTER JOINT REPLACEMENT   Remove items at home which could result in a fall. This includes throw rugs or furniture in walking pathways ICE to the affected joint every three hours while awake for 30 minutes at a time, for at least the first 3-5 days, and then as needed for pain and swelling.  Continue to use ice for pain and swelling. You may notice swelling that will progress down to the foot and ankle.  This is normal after surgery.  Elevate your leg when you are not up walking on it.   Continue to use the breathing machine you got in the hospital (incentive spirometer) which will help keep your temperature down.  It is common for your temperature to cycle up and down following surgery, especially at night when you are not up moving around and exerting yourself.  The breathing machine keeps your lungs expanded and your temperature down.  DIET:  As you were doing prior to hospitalization, we recommend a well-balanced diet.  DRESSING / WOUND CARE / SHOWERING:  Keep the surgical dressing until follow up.  The dressing is water proof, so you can shower without any extra covering.  IF THE DRESSING FALLS OFF or the wound gets wet inside, change the dressing with sterile gauze.  Please use good hand washing techniques before changing the dressing.  Do not use any lotions or creams on the incision until instructed by your surgeon.    ACTIVITY  Increase activity slowly as tolerated, but follow the weight bearing instructions below.   No driving for 6 weeks or until further direction given by your physician.  You cannot drive while taking narcotics.  No lifting or carrying greater than 10 lbs. until further directed by your surgeon. Avoid periods of inactivity such as sitting longer than an hour when not asleep. This helps prevent blood clots.  You may return to work once you are authorized by your doctor.   WEIGHT BEARING: Weight bearing as tolerated with assist device (walker, cane, etc) as  directed, use it as long as suggested by your surgeon or therapist, typically at least 4-6 weeks.  EXERCISES  Results after joint replacement surgery are often greatly improved when you follow the exercise, range of motion and muscle strengthening exercises prescribed by your doctor. Safety measures are also important to protect the joint from further injury. Any time any of these exercises cause you to have increased pain or swelling, decrease what you are doing until you are comfortable again and then slowly increase them. If you have problems or questions, call your caregiver or physical therapist for advice.   Rehabilitation is important following a joint replacement. After just a few days of immobilization, the muscles of the leg can become weakened and shrink (atrophy).  These exercises are designed to build up the tone and strength of the thigh and leg muscles and to improve motion. Often times heat used for twenty to thirty minutes before working out will loosen up your tissues and help with improving the range of motion but do not use heat for the first two weeks following surgery (sometimes heat can increase post-operative swelling).   These exercises can be done on a training (exercise) mat, on the floor, on a table or on a bed. Use whatever works the best and is most comfortable for you.    Use music or television while you are exercising so that the exercises are a pleasant break in your day. This will make your life  better with the exercises acting as a break in your routine that you can look forward to.   Perform all exercises about fifteen times, three times per day or as directed.  You should exercise both the operative leg and the other leg as well.  Exercises include:   Quad Sets - Tighten up the muscle on the front of the thigh (Quad) and hold for 5-10 seconds.   Straight Leg Raises - With your knee straight (if you were given a brace, keep it on), lift the leg to 60 degrees, hold  for 3 seconds, and slowly lower the leg.  Perform this exercise against resistance later as your leg gets stronger.  Leg Slides: Lying on your back, slowly slide your foot toward your buttocks, bending your knee up off the floor (only go as far as is comfortable). Then slowly slide your foot back down until your leg is flat on the floor again.  Angel Wings: Lying on your back spread your legs to the side as far apart as you can without causing discomfort.  Hamstring Strength:  Lying on your back, push your heel against the floor with your leg straight by tightening up the muscles of your buttocks.  Repeat, but this time bend your knee to a comfortable angle, and push your heel against the floor.  You may put a pillow under the heel to make it more comfortable if necessary.   A rehabilitation program following joint replacement surgery can speed recovery and prevent re-injury in the future due to weakened muscles. Contact your doctor or a physical therapist for more information on knee rehabilitation.   CONSTIPATION:  Constipation is defined medically as fewer than three stools per week and severe constipation as less than one stool per week.  Even if you have a regular bowel pattern at home, your normal regimen is likely to be disrupted due to multiple reasons following surgery.  Combination of anesthesia, postoperative narcotics, change in appetite and fluid intake all can affect your bowels.   YOU MUST use at least one of the following options; they are listed in order of increasing strength to get the job done.  They are all available over the counter, and you may need to use some, POSSIBLY even all of these options:    Drink plenty of fluids (prune juice may be helpful) and high fiber foods Colace 100 mg by mouth twice a day  Senokot for constipation as directed and as needed Dulcolax (bisacodyl), take with full glass of water  Miralax  (polyethylene glycol) once or twice a day as needed.  If you  have tried all these things and are unable to have a bowel movement in the first 3-4 days after surgery call either your surgeon or your primary doctor.    If you experience loose stools or diarrhea, hold the medications until you stool forms back up.  If your symptoms do not get better within 1 week or if they get worse, check with your doctor.  If you experience "the worst abdominal pain ever" or develop nausea or vomiting, please contact the office immediately for further recommendations for treatment.  ITCHING:  If you experience itching with your medications, try taking only a single pain pill, or even half a pain pill at a time.  You can also use Benadryl over the counter for itching or also to help with sleep.   TED HOSE STOCKINGS:  Use stockings on both legs until for at least 2 weeks or  as directed by physician office. They may be removed at night for sleeping.  MEDICATIONS:  See your medication summary on the "After Visit Summary" that nursing will review with you.  You may have some home medications which will be placed on hold until you complete the course of blood thinner medication.  It is important for you to complete the blood thinner medication as prescribed.  Blood clot prevention (DVT Prophylaxis): After surgery you are at an increased risk for a blood clot. You were prescribed a blood thinner, Aspirin  81mg , to be taken daily for a total of 4 weeks from surgery to help reduce your risk of getting a blood clot.  This is in addition to restarting your Plavix  three days after surgery,which is to be taken as regularly prescribed.  Signs of a pulmonary embolus (blood clot in the lungs) include sudden short of breath, feeling lightheaded or dizzy, chest pain with a deep breath, rapid pulse rapid breathing.  Signs of a blood clot in your arms or legs include new unexplained swelling and cramping, warm, red or darkened skin around the painful area.  Please call the office or 911 right away if  these signs or symptoms develop.  PRECAUTIONS:   If you experience chest pain or shortness of breath - call 911 immediately for transfer to the hospital emergency department.   If you develop a fever greater that 101 F, purulent drainage from wound, increased redness or drainage from wound, foul odor from the wound/dressing, or calf pain - CONTACT YOUR SURGEON.                                                   FOLLOW-UP APPOINTMENTS:  If you do not already have a post-op appointment, please call the office for an appointment to be seen by your surgeon.  Guidelines for how soon to be seen are listed in your "After Visit Summary", but are typically between 2-3 weeks after surgery.  If you have a specialized bandage, you may be told to follow up 1 week after surgery.  POST-OPERATIVE OPIOID TAPER INSTRUCTIONS: It is important to wean off of your opioid medication as soon as possible. If you do not need pain medication after your surgery it is ok to stop day one. Opioids include: Codeine, Hydrocodone (Norco, Vicodin), Oxycodone (Percocet, oxycontin ) and hydromorphone  amongst others.  Long term and even short term use of opiods can cause: Increased pain response Dependence Constipation Depression Respiratory depression And more.  Withdrawal symptoms can include Flu like symptoms Nausea, vomiting And more Techniques to manage these symptoms Hydrate well Eat regular healthy meals Stay active Use relaxation techniques(deep breathing, meditating, yoga) Do Not substitute Alcohol to help with tapering If you have been on opioids for less than two weeks and do not have pain than it is ok to stop all together.  Plan to wean off of opioids This plan should start within one week post op of your joint replacement. Maintain the same interval or time between taking each dose and first decrease the dose.  Cut the total daily intake of opioids by one tablet each day Next start to increase the time between  doses. The last dose that should be eliminated is the evening dose.   MAKE SURE YOU:  Understand these instructions.  Get help right away if you are not doing well  or get worse.    Thank you for letting us  be a part of your medical care team.  It is a privilege we respect greatly.  We hope these instructions will help you stay on track for a fast and full recovery!

## 2024-02-26 NOTE — Anesthesia Procedure Notes (Signed)
 Spinal  Patient location during procedure: OR Start time: 02/26/2024 1:03 PM End time: 02/26/2024 1:09 PM Reason for block: surgical anesthesia Staffing Performed: anesthesiologist  Anesthesiologist: Tura Gaines, MD Performed by: Tura Gaines, MD Authorized by: Tura Gaines, MD   Preanesthetic Checklist Completed: patient identified, IV checked, site marked, risks and benefits discussed, surgical consent, monitors and equipment checked, pre-op evaluation and timeout performed Spinal Block Patient position: sitting Prep: DuraPrep and site prepped and draped Patient monitoring: cardiac monitor, continuous pulse ox, blood pressure and heart rate Approach: midline Location: L3-4 Injection technique: catheter Needle Needle type: Tuohy and Spinocan  Needle gauge: 24 G Needle length: 12.7 cm Needle insertion depth: 7 cm Catheter type: closed end flexible Catheter size: 19 g Catheter at skin depth: 11 cm Assessment Sensory level: T6 Events: CSF return Additional Notes Epidural performed using LOR with air technique. No CSF, Heme or paresthesias. SAB performed through the epidural needle using 24ga Spinocan needle. CSF clear with free flow and no paresthesias. Local anesthetic and narcotics injected through the spinal needle and withdrawn. Epidural catheter threaded 5cm into the epidural space and the epidural needle was withdrawn. A sterile dressing was applied and the patient placed supine with LUD. The patient tolerated the procedure well and adequate sensory level was obtained.

## 2024-02-26 NOTE — Transfer of Care (Signed)
 Immediate Anesthesia Transfer of Care Note  Patient: Chris [name]  Procedure(s) Performed: RESECTION PROXIMAL BONE TUMOR FEMUR, HEMIARTHROPLASTY, LOCAL TISSUE ADVANCEMENT (Right: Hip)  Patient Location: PACU  Anesthesia Type:MAC  Level of Consciousness: drowsy  Airway & Oxygen Therapy: Patient Spontanous Breathing and Patient connected to face mask oxygen  Post-op Assessment: Report given to RN and Post -op Vital signs reviewed and stable  Post vital signs: Reviewed and stable  Last Vitals:  Vitals Value Taken Time  BP 120/70 02/26/24 1717  Temp    Pulse 70 02/26/24 1719  Resp 18   SpO2 100 % 02/26/24 1719  Vitals shown include unfiled device data.  Last Pain:  Vitals:   02/26/24 1043  TempSrc:   PainSc: 0-No pain         Complications: No notable events documented.

## 2024-02-26 NOTE — Anesthesia Postprocedure Evaluation (Signed)
 Anesthesia Post Note  Patient: Chris [name]  Procedure(s) Performed: RESECTION PROXIMAL BONE TUMOR FEMUR, HEMIARTHROPLASTY, LOCAL TISSUE ADVANCEMENT (Right: Hip)     Patient location during evaluation: PACU Anesthesia Type: Spinal, MAC and Epidural Level of consciousness: awake and alert Pain management: pain level controlled Vital Signs Assessment: post-procedure vital signs reviewed and stable Respiratory status: spontaneous breathing, nonlabored ventilation and respiratory function stable Cardiovascular status: stable and blood pressure returned to baseline Postop Assessment: no apparent nausea or vomiting Anesthetic complications: no   No notable events documented.  Last Vitals:  Vitals:   02/26/24 1835 02/26/24 1951  BP: (!) 153/92 (!) 142/84  Pulse: 63 80  Resp: 16 17  Temp:  36.7 C  SpO2: 97% 99%    Last Pain:  Vitals:   02/26/24 1954  TempSrc:   PainSc: 0-No pain                 Ayane Delancey

## 2024-02-26 NOTE — Interval H&P Note (Signed)
 The patient has been re-examined, and the chart reviewed, and there have been no interval changes to the documented history and physical.    Plan for Right proximal femoral resection and conversion to proximal femoral replacement.  The operative side was examined and the patient was confirmed to have sensation to DPN, SPN, TN intact, Motor EHL, ext, flex 5/5, and DP 2+, PT 2+, No significant edema.   The risks, benefits, and alternatives have been discussed at length with patient, and the patient is willing to proceed.  Right hip marked. Consent has been signed.

## 2024-02-27 ENCOUNTER — Other Ambulatory Visit: Payer: Self-pay

## 2024-02-27 LAB — BASIC METABOLIC PANEL WITH GFR
Anion gap: 10 (ref 5–15)
BUN: 12 mg/dL (ref 8–23)
CO2: 21 mmol/L — ABNORMAL LOW (ref 22–32)
Calcium: 8.4 mg/dL — ABNORMAL LOW (ref 8.9–10.3)
Chloride: 104 mmol/L (ref 98–111)
Creatinine, Ser: 1.02 mg/dL (ref 0.61–1.24)
GFR, Estimated: >60 mL/min (ref >60)
Glucose, Bld: 136 mg/dL — ABNORMAL HIGH (ref 70–99)
Potassium: 3.6 mmol/L (ref 3.5–5.1)
Sodium: 135 mmol/L (ref 135–145)

## 2024-02-27 LAB — CBC
HCT: 36.8 % — ABNORMAL LOW (ref 39.0–52.0)
Hemoglobin: 12 g/dL — ABNORMAL LOW (ref 13.0–17.0)
MCH: 29.3 pg (ref 26.0–34.0)
MCHC: 32.6 g/dL (ref 30.0–36.0)
MCV: 90 fL (ref 80.0–100.0)
Platelets: 182 10*3/uL (ref 150–400)
RBC: 4.09 MIL/uL — ABNORMAL LOW (ref 4.22–5.81)
RDW: 14.2 % (ref 11.5–15.5)
WBC: 10.2 10*3/uL (ref 4.0–10.5)
nRBC: 0 % (ref 0.0–0.2)

## 2024-02-27 MED ORDER — CLOPIDOGREL BISULFATE 75 MG PO TABS
75.0000 mg | ORAL_TABLET | Freq: Every day | ORAL | Status: DC
  Administered 2024-02-28: 75 mg via ORAL
  Filled 2024-02-27: qty 1

## 2024-02-27 NOTE — Progress Notes (Signed)
 Physical Therapy Treatment Patient Details Name: Chris [name] MRN: [id number] DOB: 02/08/1961 Today's Date: 02/27/2024   History of Present Illness Pt admitted with R subtrochanteric fx non-union, pathologic fs from suspected lung mets and now s/p hemi arthroplasty by posterior approach.  Pt wtih hx of CAD, COPD, MI and metastatic CA    PT Comments  Pt very motivated and progressing steadily with mobility but requiring mod cues for adherence to THP with transfers.  Pt hopeful for dc home tomorrow.    If plan is discharge home, recommend the following: A little help with walking and/or transfers;A little help with bathing/dressing/bathroom;Assistance with cooking/housework;Assist for transportation   Can travel by private vehicle        Equipment Recommendations  None recommended by PT    Recommendations for Other Services       Precautions / Restrictions Precautions Precautions: Fall;Posterior Hip Precaution Booklet Issued: Yes (comment) Precaution/Restrictions Comments: Pt recalls 1/3 THP - all THP reviewed Restrictions Weight Bearing Restrictions Per Provider Order: No Other Position/Activity Restrictions: WBAT     Mobility  Bed Mobility Overal bed mobility: Needs Assistance Bed Mobility: Sit to Supine     Supine to sit: Min assist Sit to supine: Min assist, Mod assist   General bed mobility comments: Increased time with cues for sequence and use of L LE to self assist    Transfers Overall transfer level: Needs assistance Equipment used: Rolling walker (2 wheels) Transfers: Sit to/from Stand Sit to Stand: Contact guard assist           General transfer comment: cues for LE management, use of UEs to self assist and adherence to THP    Ambulation/Gait Ambulation/Gait assistance: Contact guard assist Gait Distance (Feet): 95 Feet Assistive device: Rolling walker (2 wheels) Gait Pattern/deviations: Step-to pattern, Decreased step length - right, Decreased  step length - left, Shuffle, Trunk flexed Gait velocity: decr     General Gait Details: cues for sequence, posture and position from [employer]             Wheelchair Mobility     Tilt Bed    Modified Rankin (Stroke Patients Only)       Balance Overall balance assessment: Needs assistance Sitting-balance support: No upper extremity supported, Feet supported Sitting balance-Leahy Scale: Good     Standing balance support: No upper extremity supported Standing balance-Leahy Scale: Fair                              Hotel manager: No apparent difficulties  Cognition Arousal: Alert Behavior During Therapy: WFL for tasks assessed/performed   PT - Cognitive impairments: No apparent impairments                         Following commands: Intact      Cueing Cueing Techniques: Verbal cues, Visual cues  Exercises Total Joint Exercises Ankle Circles/Pumps: AROM, Both, 15 reps, Supine    General Comments        Pertinent Vitals/Pain Pain Assessment Pain Assessment: 0-10 Pain Score: 5  Pain Location: R hip Pain Descriptors / Indicators: Aching, Sore Pain Intervention(s): Limited activity within patient's tolerance, Monitored during session, Premedicated before session    Home Living Family/patient expects to be discharged to:: Private residence Living Arrangements: Spouse/significant other Available Help at Discharge: Family;Available PRN/intermittently Type of Home: House Home Access: Stairs to enter Entrance Stairs-Rails: Left Entrance Stairs-Number  of Steps: 4   Home Layout: One level Home Equipment: Shower seat - built in;Grab bars - tub/shower;Cane - quad;Rollator (4 wheels);Grab bars - toilet;Wheelchair - Forensic psychologist (2 wheels) 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, political party            PT Goals (current goals can now be found in the care plan section) Acute Rehab PT  Goals Patient Stated Goal: Regain IND PT Goal Formulation: With patient Time For Goal Achievement: 03/05/24 Potential to Achieve Goals: Good Progress towards PT goals: Progressing toward goals    Frequency    7X/week      PT Plan      Co-evaluation              AM-PAC PT "6 Clicks" Mobility   Outcome Measure  Help needed turning from your back to your side while in a flat bed without using bedrails?: A Little Help needed moving from lying on your back to sitting on the side of a flat bed without using bedrails?: A Little Help needed moving to and from a bed to a chair (including a wheelchair)?: A Little Help needed standing up from a chair using your arms (e.g., wheelchair or bedside chair)?: A Little Help needed to walk in hospital room?: A Little Help needed climbing 3-5 steps with a railing? : A Lot 6 Click Score: 17    End of Session Equipment Utilized During Treatment: Gait belt Activity Tolerance: Patient tolerated treatment well Patient left: in bed;with call bell/phone within reach;with bed alarm set;with nursing/sitter in room;with family/visitor present Nurse Communication: Mobility status PT Visit Diagnosis: Difficulty in walking, not elsewhere classified (R26.2)     Time: 4540-9811 PT Time Calculation (min) (ACUTE ONLY): 21 min  Charges:    $Gait Training: 8-22 mins PT General Charges $$ ACUTE PT VISIT: 1 Visit                     Thedora Finlay PT Acute Rehabilitation Services Pager [phone] Office [phone]    Christell Steinmiller 02/27/2024, 3:40 PM

## 2024-02-27 NOTE — Plan of Care (Signed)
   Problem: Activity: Goal: Risk for activity intolerance will decrease Outcome: Progressing   Problem: Pain Managment: Goal: General experience of comfort will improve and/or be controlled Outcome: Progressing   Problem: Safety: Goal: Ability to remain free from injury will improve Outcome: Progressing

## 2024-02-27 NOTE — Evaluation (Signed)
 Physical Therapy Evaluation Patient Details Name: Chris [name] MRN: [id number] DOB: 1961/05/05 Today's Date: 02/27/2024  History of Present Illness  Pt admitted with R subtrochanteric fx non-union, pathologic fs from suspected lung mets and now s/p hemi arthroplasty by posterior approach.  Pt wtih hx of CAD, COPD, MI and metastatic CA  Clinical Impression  Pt admitted as above and presenting with functional mobility limitations 2* decreased R LE strength/ROM, post op pain and posterior THP.  Pt hopes to progress to dc home with family assist and reports HHPT follow up planned.        If plan is discharge home, recommend the following: A little help with walking and/or transfers;A little help with bathing/dressing/bathroom;Assistance with cooking/housework;Assist for transportation   Can travel by private vehicle        Equipment Recommendations None recommended by PT  Recommendations for Other Services       Functional Status Assessment Patient has had a recent decline in their functional status and demonstrates the ability to make significant improvements in function in a reasonable and predictable amount of time.     Precautions / Restrictions Precautions Precautions: Fall;Posterior Hip Precaution Booklet Issued: Yes (comment) Restrictions Weight Bearing Restrictions Per Provider Order: No Other Position/Activity Restrictions: WBAT      Mobility  Bed Mobility Overal bed mobility: Needs Assistance Bed Mobility: Supine to Sit     Supine to sit: Min assist     General bed mobility comments: Increased time with cues for sequence and use of L LE to self assist    Transfers Overall transfer level: Needs assistance Equipment used: Rolling walker (2 wheels) Transfers: Sit to/from Stand Sit to Stand: Min assist           General transfer comment: cues for LE management, use of UEs to self assist and adherence to THP    Ambulation/Gait Ambulation/Gait assistance:  Min assist Gait Distance (Feet): 23 Feet Assistive device: Rolling walker (2 wheels) Gait Pattern/deviations: Step-to pattern, Decreased step length - right, Decreased step length - left, Shuffle, Trunk flexed Gait velocity: decr     General Gait Details: cues for sequence, posture and position from [employer]            Wheelchair Mobility     Tilt Bed    Modified Rankin (Stroke Patients Only)       Balance Overall balance assessment: Needs assistance Sitting-balance support: No upper extremity supported, Feet supported Sitting balance-Leahy Scale: Good     Standing balance support: Bilateral upper extremity supported Standing balance-Leahy Scale: Poor                               Pertinent Vitals/Pain Pain Assessment Pain Assessment: 0-10 Pain Score: 5  Pain Location: R hip Pain Descriptors / Indicators: Aching, Sore Pain Intervention(s): Limited activity within patient's tolerance, Premedicated before session, Monitored during session, Ice applied    Home Living Family/patient expects to be discharged to:: Private residence Living Arrangements: Spouse/significant other Available Help at Discharge: Family;Available PRN/intermittently Type of Home: House Home Access: Stairs to enter Entrance Stairs-Rails: Left Entrance Stairs-Number of Steps: 4   Home Layout: One level Home Equipment: Shower seat - built in;Grab bars - tub/shower;Cane - quad;Rollator (4 wheels);Grab bars - toilet;Wheelchair - Forensic psychologist (2 wheels) 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, political party Prior Level of Function : Needs assist  Mobility Comments: ambulates with Rollator/RW ADLs Comments: ind in most bADLs, wife occasionally assists with LBD     Extremity/Trunk Assessment   Upper Extremity Assessment Upper Extremity Assessment: Overall WFL for tasks assessed    Lower Extremity Assessment Lower Extremity Assessment: RLE  deficits/detail    Cervical / Trunk Assessment Cervical / Trunk Assessment: Normal  Communication   Communication Communication: No apparent difficulties    Cognition Arousal: Alert Behavior During Therapy: WFL for tasks assessed/performed   PT - Cognitive impairments: No apparent impairments                         Following commands: Intact       Cueing Cueing Techniques: Verbal cues     General Comments      Exercises Total Joint Exercises Ankle Circles/Pumps: AROM, Both, 15 reps, Supine   Assessment/Plan    PT Assessment Patient needs continued PT services  PT Problem List Decreased strength;Decreased range of motion;Decreased activity tolerance;Decreased balance;Decreased mobility;Decreased knowledge of use of DME;Pain       PT Treatment Interventions DME instruction;Gait training;Stair training;Functional mobility training;Therapeutic activities;Therapeutic exercise;Patient/family education    PT Goals (Current goals can be found in the Care Plan section)  Acute Rehab PT Goals Patient Stated Goal: Regain IND PT Goal Formulation: With patient Time For Goal Achievement: 03/05/24 Potential to Achieve Goals: Good    Frequency 7X/week     Co-evaluation               AM-PAC PT "6 Clicks" Mobility  Outcome Measure Help needed turning from your back to your side while in a flat bed without using bedrails?: A Little Help needed moving from lying on your back to sitting on the side of a flat bed without using bedrails?: A Little Help needed moving to and from a bed to a chair (including a wheelchair)?: A Little Help needed standing up from a chair using your arms (e.g., wheelchair or bedside chair)?: A Little Help needed to walk in hospital room?: A Little Help needed climbing 3-5 steps with a railing? : A Lot 6 Click Score: 17    End of Session Equipment Utilized During Treatment: Gait belt Activity Tolerance: Patient tolerated treatment  well Patient left: in chair;with call bell/phone within reach;with family/visitor present Nurse Communication: Mobility status PT Visit Diagnosis: Difficulty in walking, not elsewhere classified (R26.2)    Time: 4098-1191 PT Time Calculation (min) (ACUTE ONLY): 35 min   Charges:   PT Evaluation $PT Eval Low Complexity: 1 Low PT Treatments $Gait Training: 8-22 mins PT General Charges $$ ACUTE PT VISIT: 1 Visit         Thedora Finlay PT Acute Rehabilitation Services Pager [phone] Office [phone]   Janki Dike 02/27/2024, 1:16 PM

## 2024-02-27 NOTE — TOC Transition Note (Addendum)
 Transition of Care [facility]) - Discharge Note   Patient Details  Name: Chris [name] MRN: [id number] Date of Birth: 09-10-1961  [facility] CM/SW Contact:  Bari Leys, RN Phone Number: 02/27/2024, 11:27 AM   Clinical Narrative:   Met with patient at bedside to review dc therapy and home equipment needs, spouse confirmed [facility] PT w[facility] reports pt has RW, no home DME needs. No TOC needs.     Final next level of care: Home w Home Health Services Barriers to Discharge: No Barriers Identified   Patient Goals and CMS Choice Patient states their goals for this hospitalization and ongoing recovery are:: return home          Discharge Placement                       Discharge Plan and Services Additional resources added to the After Visit Summary for                                       Social Drivers of Health (SDOH) Interventions SDOH Screenings   Food Insecurity: No Food Insecurity (02/26/2024)  Housing: Low Risk  (02/26/2024)  Transportation Needs: No Transportation Needs (02/26/2024)  Utilities: Not At Risk (02/26/2024)  Tobacco Use: High Risk (02/26/2024)     Readmission Risk Interventions    02/27/2024   11:26 AM  Readmission Risk Prevention Plan  Post Dischage Appt Complete  Medication Screening Complete  Transportation Screening Complete

## 2024-02-27 NOTE — Progress Notes (Signed)
     Subjective:  Patient reports pain as moderate.  Looks good this morning. Denies distal n/t. Plan for mobilization with PT today.  Objective:   VITALS:   Vitals:   02/26/24 1959 02/26/24 2159 02/27/24 0135 02/27/24 0445  BP:  (!) 158/77 122/67 126/74  Pulse:  86 83 87  Resp:  16 17 15   Temp:  98.1 F (36.7 C) 98.2 F (36.8 C) 98.4 F (36.9 C)  TempSrc:  Oral Oral Oral  SpO2:  98% 97% 97%  Weight:      Height: 5\' 11"  (1.803 m)       Sensation intact distally Intact pulses distally Dorsiflexion/Plantar flexion intact Incision: dressing C/D/I Compartment soft    Lab Results  Component Value Date   WBC 10.2 02/27/2024   HGB 12.0 (L) 02/27/2024   HCT 36.8 (L) 02/27/2024   MCV 90.0 02/27/2024   PLT 182 02/27/2024   BMET    Component Value Date/Time   NA 135 02/27/2024 0334   K 3.6 02/27/2024 0334   CL 104 02/27/2024 0334   CO2 21 (L) 02/27/2024 0334   GLUCOSE 136 (H) 02/27/2024 0334   BUN 12 02/27/2024 0334   CREATININE 1.02 02/27/2024 0334   CREATININE 1.07 08/27/2023 1331   CALCIUM  8.4 (L) 02/27/2024 0334   GFRNONAA >60 02/27/2024 0334   GFRNONAA >60 08/27/2023 1331     Xray: PFR components in good position, no adverse features  Assessment/Plan: 1 Day Post-Op   Principal Problem:   Closed fracture of right hip requiring operative repair with nonunion, subsequent encounter  S/p R proximal femur resection with hardware removal a conversion to proximal femur replacement 02/26/24  Post op recs: WB: WBAT RLE, 6 weeks posterior hip precautions Abx: ancef  Imaging: PACU pelvis Xray Dressing: Aquacell, keep intact until follow up DVT prophylaxis: Aspirin  81BID starting POD1, resume plavix  POD2` Follow up: 2 weeks after surgery for a wound check with Dr. Pryor Browning at [facility].  Address: [address], [place], [place] [address]  Office Phone: [phone]   Murleen Arms 02/27/2024, 6:22 AM   Priscille Brought,  MD  Contact information:   [phone] 7am-5pm epic message Dr. Pryor Browning, or call office for patient follow up: [phone] After hours and holidays please check [email or web address] for group call information for [facility]

## 2024-02-28 ENCOUNTER — Encounter (HOSPITAL_COMMUNITY): Payer: Self-pay | Admitting: Orthopedic Surgery

## 2024-02-28 LAB — SURGICAL PATHOLOGY

## 2024-02-28 LAB — CBC
HCT: 36.2 % — ABNORMAL LOW (ref 39.0–52.0)
Hemoglobin: 11.9 g/dL — ABNORMAL LOW (ref 13.0–17.0)
MCH: 29.5 pg (ref 26.0–34.0)
MCHC: 32.9 g/dL (ref 30.0–36.0)
MCV: 89.6 fL (ref 80.0–100.0)
Platelets: 155 10*3/uL (ref 150–400)
RBC: 4.04 MIL/uL — ABNORMAL LOW (ref 4.22–5.81)
RDW: 14.7 % (ref 11.5–15.5)
WBC: 7.6 10*3/uL (ref 4.0–10.5)
nRBC: 0 % (ref 0.0–0.2)

## 2024-02-28 NOTE — Discharge Summary (Signed)
 Physician Discharge Summary  Patient ID: Chris [name] MRN: [id number] DOB/AGE: 03-19-1961 63 y.o.  Admit date: 02/26/2024 Discharge date: 02/28/2024  Admission Diagnoses:  Closed fracture of right hip requiring operative repair with nonunion, subsequent encounter  Discharge Diagnoses:  Principal Problem:   Closed fracture of right hip requiring operative repair with nonunion, subsequent encounter   Past Medical History:  Diagnosis Date   Anxiety    associated with medical care,  worsened by difficulty hearing, does better with wife present   Arthritis    Cancer associated pain    Cancer, metastatic to liver (HCC)    Complication of anesthesia    wife states very anxious may need pre sedation   COPD (chronic obstructive pulmonary disease) (HCC)    Coronary artery disease    Dyspnea    GERD (gastroesophageal reflux disease)    Hearing loss    no hearing aids per wife   Hyperlipidemia    Hypertension    MI (myocardial infarction) (HCC) 04/17/2006   Acute inferolateral wall MI with cardiogenic shock and complete heart block   Primary adenocarcinoma of upper lobe of right lung (HCC)    Prostate cancer (HCC)    Sleep apnea    Tobacco abuse    Wears glasses    Wears partial dentures    Upper    Surgeries: Procedure(s): RESECTION PROXIMAL BONE TUMOR FEMUR, HEMIARTHROPLASTY, LOCAL TISSUE ADVANCEMENT on 02/26/2024   Consultants (if any):   Discharged Condition: Improved  Hospital Course: Chris [name] is an 63 y.o. male who was admitted 02/26/2024 with a diagnosis of Closed fracture of right hip requiring operative repair with nonunion, subsequent encounter and went to the operating room on 02/26/2024 and underwent the above named procedures.    He was given perioperative antibiotics:  Anti-infectives (From admission, onward)    Start     Dose/Rate Route Frequency Ordered Stop   02/29/24 2200  cefadroxil  (DURICEF) capsule 500 mg        500 mg Oral 2 times daily 02/26/24  2005 03/07/24 2159   02/26/24 2200  ceFAZolin  (ANCEF ) IVPB 2g/100 mL premix        2 g 200 mL/hr over 30 Minutes Intravenous Every 8 hours 02/26/24 2005 02/29/24 2159   02/26/24 1030  ceFAZolin  (ANCEF ) IVPB 2g/100 mL premix  Status:  Discontinued        2 g 200 mL/hr over 30 Minutes Intravenous On call to O.R. 02/26/24 1015 02/26/24 1931   02/26/24 0000  cefadroxil  (DURICEF) 500 MG capsule        500 mg Oral 2 times daily 02/26/24 1742 03/04/24 2359     .  He was given sequential compression devices, early ambulation, and resumed plavix  with aspirin  81mg  for DVT prophylaxis.  Also prescription for cefadroxil  for post-operative antibiotic prophylaxis.  He benefited maximally from the hospital stay and there were no complications.    Recent vital signs:  Vitals:   02/28/24 0742 02/28/24 0854  BP: (!) 143/83 119/73  Pulse: 86 93  Resp: 18   Temp: 98.3 F (36.8 C)   SpO2: 97%     Recent laboratory studies:  Lab Results  Component Value Date   HGB 11.9 (L) 02/28/2024   HGB 12.0 (L) 02/27/2024   HGB 15.3 02/24/2024   Lab Results  Component Value Date   WBC 7.6 02/28/2024   PLT 155 02/28/2024   Lab Results  Component Value Date   INR 1.0 01/09/2023   Lab Results  Component Value Date   NA 135 02/27/2024   K 3.6 02/27/2024   CL 104 02/27/2024   CO2 21 (L) 02/27/2024   BUN 12 02/27/2024   CREATININE 1.02 02/27/2024   GLUCOSE 136 (H) 02/27/2024    Discharge Medications:   Allergies as of 02/28/2024       Reactions   Pollen Extract Cough        Medication List     STOP taking these medications    meloxicam  7.5 MG tablet Commonly known as: Mobic        TAKE these medications    acetaminophen  500 MG tablet Commonly known as: TYLENOL  Take 2 tablets (1,000 mg total) by mouth every 8 (eight) hours as needed.   albuterol  (2.5 MG/3ML) 0.083% nebulizer solution Commonly known as: PROVENTIL  Take 3 mLs (2.5 mg total) by nebulization every 6 (six) hours as  needed for wheezing or shortness of breath. What changed: Another medication with the same name was removed. Continue taking this medication, and follow the directions you see here.   ALPRAZolam  0.25 MG tablet Commonly known as: XANAX  Take 1 tablet (0.25 mg total) by mouth at bedtime.   aspirin  EC 81 MG tablet Take 1 tablet (81 mg total) by mouth 2 (two) times daily for 28 days. Swallow whole.   atorvastatin  40 MG tablet Commonly known as: LIPITOR Take 1 tablet (40 mg total) by mouth daily.   Breztri  Aerosphere 160-9-4.8 MCG/ACT Aero inhaler Generic drug: budeson-glycopyrrolate -formoterol  Inhale 2 puffs into the lungs in the morning and at bedtime.   cefadroxil  500 MG capsule Commonly known as: DURICEF Take 1 capsule (500 mg total) by mouth 2 (two) times daily for 7 days.   clopidogrel  75 MG tablet Commonly known as: PLAVIX  Take 1 tablet (75 mg total) by mouth daily. Start taking on: Feb 29, 2024   [employer] Recruitment consultant Use as directed for nebulizer medication.   fluticasone  50 MCG/ACT nasal spray Commonly known as: FLONASE  Place 1 spray into both nostrils daily as needed for allergies.   ketoconazole 2 % cream Commonly known as: NIZORAL Apply 1 application  topically 2 (two) times daily as needed for irritation.   methocarbamol  500 MG tablet Commonly known as: ROBAXIN  Take 1 tablet (500 mg total) by mouth every 8 (eight) hours as needed for up to 10 days for muscle spasms.   metoprolol  tartrate 25 MG tablet Commonly known as: LOPRESSOR  Take 1 tablet (25 mg total) by mouth daily.   ondansetron  4 MG tablet Commonly known as: Zofran  Take 1 tablet (4 mg total) by mouth every 8 (eight) hours as needed for up to 14 days for nausea or vomiting.   Oxycodone  HCl 10 MG Tabs Take 0.5-1 tablets (5-10 mg total) by mouth every 4 (four) hours as needed. What changed: Another medication with the same name was added. Make sure you understand how and when to take  each.   oxyCODONE  5 MG immediate release tablet Commonly known as: Roxicodone  Take 0.5-1 tablets (2.5-5 mg total) by mouth every 4 (four) hours as needed for up to 7 days for breakthrough pain (For post-operative pain). What changed: You were already taking a medication with the same name, and this prescription was added. Make sure you understand how and when to take each.   pantoprazole  40 MG tablet Commonly known as: Protonix  Take 1 tablet (40 mg total) by mouth daily.   polyethylene glycol 17 g packet Commonly known as: MiraLax  Take 17 g by mouth daily.  sennosides-docusate sodium  8.6-50 MG tablet Commonly known as: SENOKOT-S Take 1 tablet by mouth daily.   sertraline  100 MG tablet Commonly known as: ZOLOFT  TAKE 1 TABLET BY MOUTH EVERY DAY What changed:  how much to take Another medication with the same name was removed. Continue taking this medication, and follow the directions you see here.   tamsulosin  0.4 MG Caps capsule Commonly known as: FLOMAX  Take 1 capsule (0.4 mg total) by mouth daily.   Vitamin D  (Ergocalciferol ) 1.25 MG (50000 UNIT) Caps capsule Commonly known as: DRISDOL  TAKE 1 CAPSULE BY MOUTH ONE TIME PER WEEK   Xtampza  ER 9 MG C12a Generic drug: oxyCODONE  ER Take 9 mg by mouth in the morning and at bedtime.        Diagnostic Studies: DG FEMUR PORT, MIN 2 VIEWS RIGHT Result Date: 02/26/2024 CLINICAL DATA:  Status post right leg hardware removal EXAM: RIGHT FEMUR PORTABLE 2 VIEW COMPARISON:  CT 02/01/2024 FINDINGS: Interval right hip bipolar hemiarthroplasty with a long stem femoral component and extraction of previously noted intramedullary rod proximal and distal interlocking screws. No unexpected fracture or dislocation. Normal overall alignment this limited examination. Repeat there feces are seen within the expected prostate gland. IMPRESSION: 1. Interval right hip bipolar hemiarthroplasty. Electronically Signed   By: Worthy Heads M.D.   On:  02/26/2024 20:03   DG HIP UNILAT W OR W/O PELVIS 2-3 VIEWS RIGHT Result Date: 02/26/2024 CLINICAL DATA:  Right hip arthroplasty, hardware removal EXAM: DG HIP (WITH OR WITHOUT PELVIS) 2-3V RIGHT COMPARISON:  10/17/2023 FINDINGS: Frontal and lateral views of the right femur are obtained. Interval removal of the prior ORIF hardware, with resection of the proximal femur. Placement of a right hip hemiarthroplasty with long stem femoral component, in the expected position without evidence of acute complication. Postsurgical changes in the overlying soft tissues. IMPRESSION: 1. Right hip hemiarthroplasty placement as above. No evidence of complication. Electronically Signed   By: Bobbye Burrow M.D.   On: 02/26/2024 17:45   DG HIP UNILAT WITH PELVIS 1V RIGHT Result Date: 02/26/2024 CLINICAL DATA:  Right femoral surgery EXAM: DG HIP (WITH OR WITHOUT PELVIS) 1V RIGHT COMPARISON:  10/17/2023 FINDINGS: Five fluoroscopic images are obtained during the performance of the procedure and are provided for interpretation only. Images demonstrate removal of the prior ORIF hardware spanning the proximal femoral fracture. The final image demonstrates the fractured screw fragment seen within the distal femur on prior study overlying the medial soft tissues, with surgical device overlying the screw fragment. Right hip arthroplasty component with long-stem femoral component is identified. Please refer to operative report. Fluoroscopy time: 25 seconds, 0.9461 mGy IMPRESSION: 1. Intraoperative evaluation demonstrating removal of prior surgical hardware and placement of a right hip arthroplasty. Please refer to the operative report. Electronically Signed   By: Bobbye Burrow M.D.   On: 02/26/2024 17:45   DG C-Arm 1-60 Min-No Report Result Date: 02/26/2024 Fluoroscopy was utilized by the requesting physician.  No radiographic interpretation.   DG C-Arm 1-60 Min-No Report Result Date: 02/26/2024 Fluoroscopy was utilized by the  requesting physician.  No radiographic interpretation.   DG C-Arm 1-60 Min-No Report Result Date: 02/26/2024 Fluoroscopy was utilized by the requesting physician.  No radiographic interpretation.   DG C-Arm 1-60 Min-No Report Result Date: 02/26/2024 Fluoroscopy was utilized by the requesting physician.  No radiographic interpretation.   CT HIP RIGHT WO CONTRAST Result Date: 01/31/2024 CLINICAL DATA:  Pain. Prior intramedullary nail fixation of the right femur. EXAM: CT OF THE RIGHT  HIP WITHOUT CONTRAST TECHNIQUE: Multidetector CT imaging of the right hip was performed according to the standard protocol. Multiplanar CT image reconstructions were also generated. RADIATION DOSE REDUCTION: This exam was performed according to the departmental dose-optimization program which includes automated exposure control, adjustment of the mA and/or kV according to patient size and/or use of iterative reconstruction technique. COMPARISON:  CT chest, abdomen, and pelvis dated 12/20/2023. Right femur radiograph dated 10/17/2023. CT right femur dated 09/12/2022. FINDINGS: Bones/Joint/Cartilage Status post intramedullary nail fixation of the right femur with 2 proximal screws and 2 distal interlocking screws. There is a linear lucency extending through the inferior most proximal intramedullary screw as it exits the medial aspect of the intramedullary rod (series 8, image images 93-95), this was not clearly visualized on the prior exams. Similar slight angulation with fracture of the proximal distal interlocking screw (series 8, image 99). Redemonstrated comminuted transverse fracture of the proximal right femoral diaphysis without evidence of significant osseous bridging. Similar cortical sclerosis/thickening of the surrounding fracture margins, corresponding to site of known pathologic fracture. The right femoral head is seated within the acetabulum. The remainder of the visualized bones are intact. Ligaments Ligaments are  suboptimally evaluated by CT. Muscles and Tendons No intramuscular fluid collection identified. Soft tissue Postsurgical soft tissue changes along the right lateral hip. Brachytherapy seeds are noted. No enlarged lymph nodes identified in the field of view. IMPRESSION: 1. Status post intramedullary nail fixation of the right femur. There is a nondisplaced fracture of the inferior most proximal intramedullary screw, as it exits the medial aspect of the intramedullary nail, this was not visualized on prior exam. Redemonstrated slightly angulated fracture of the proximal distal interlocking screw. 2. Redemonstrated comminuted transverse fracture of the proximal right femoral diaphysis without evidence of significant interval healing. Electronically Signed   By: Mannie Seek M.D.   On: 01/31/2024 17:02    Disposition: Discharge disposition: 01-Home or Self Care       Discharge Instructions     Call MD / Call 911   Complete by: As directed    If you experience chest pain or shortness of breath, CALL 911 and be transported to the hospital emergency room.  If you develope a fever above 101 F, pus (white drainage) or increased drainage or redness at the wound, or calf pain, call your surgeon's office.   Constipation Prevention   Complete by: As directed    Drink plenty of fluids.  Prune juice may be helpful.  You may use a stool softener, such as Colace (over the counter) 100 mg twice a day.  Use MiraLax  (over the counter) for constipation as needed.   Diet - low sodium heart healthy   Complete by: As directed    Do not sit on low chairs, stoools or toilet seats, as it may be difficult to get up from low surfaces   Complete by: As directed    Driving restrictions   Complete by: As directed    No driving for 4-6 weeks   Follow the hip precautions as taught in Physical Therapy   Complete by: As directed    Increase activity slowly as tolerated   Complete by: As directed    Post-operative  opioid taper instructions:   Complete by: As directed    POST-OPERATIVE OPIOID TAPER INSTRUCTIONS: It is important to wean off of your opioid medication as soon as possible. If you do not need pain medication after your surgery it is ok to stop day one. Opioids  include: Codeine, Hydrocodone (Norco, Vicodin), Oxycodone (Percocet, oxycontin ) and hydromorphone  amongst others.  Long term and even short term use of opiods can cause: Increased pain response Dependence Constipation Depression Respiratory depression And more.  Withdrawal symptoms can include Flu like symptoms Nausea, vomiting And more Techniques to manage these symptoms Hydrate well Eat regular healthy meals Stay active Use relaxation techniques(deep breathing, meditating, yoga) Do Not substitute Alcohol  to help with tapering If you have been on opioids for less than two weeks and do not have pain than it is ok to stop all together.  Plan to wean off of opioids This plan should start within one week post op of your joint replacement. Maintain the same interval or time between taking each dose and first decrease the dose.  Cut the total daily intake of opioids by one tablet each day Next start to increase the time between doses. The last dose that should be eliminated is the evening dose.      TED hose   Complete by: As directed    Use stockings (TED hose) for 2 weeks on both leg(s).  Then for 2 more weeks on the surgical leg.  You may remove them at night for sleeping.        Follow-up Information     Murleen Arms, MD Follow up in 2 week(s).   Specialty: Orthopedic Surgery Contact information: [address] [place] [address] [phone]                    Discharge Instructions      INSTRUCTIONS AFTER JOINT REPLACEMENT   Remove items at home which could result in a fall. This includes throw rugs or furniture in walking pathways ICE to the affected joint every three hours  while awake for 30 minutes at a time, for at least the first 3-5 days, and then as needed for pain and swelling.  Continue to use ice for pain and swelling. You may notice swelling that will progress down to the foot and ankle.  This is normal after surgery.  Elevate your leg when you are not up walking on it.   Continue to use the breathing machine you got in the hospital (incentive spirometer) which will help keep your temperature down.  It is common for your temperature to cycle up and down following surgery, especially at night when you are not up moving around and exerting yourself.  The breathing machine keeps your lungs expanded and your temperature down.  DIET:  As you were doing prior to hospitalization, we recommend a well-balanced diet.  DRESSING / WOUND CARE / SHOWERING:  Keep the surgical dressing until follow up.  The dressing is water  proof, so you can shower without any extra covering.  IF THE DRESSING FALLS OFF or the wound gets wet inside, change the dressing with sterile gauze.  Please use good hand washing techniques before changing the dressing.  Do not use any lotions or creams on the incision until instructed by your surgeon.    ACTIVITY  Increase activity slowly as tolerated, but follow the weight bearing instructions below.   No driving for 6 weeks or until further direction given by your physician.  You cannot drive while taking narcotics.  No lifting or carrying greater than 10 lbs. until further directed by your surgeon. Avoid periods of inactivity such as sitting longer than an hour when not asleep. This helps prevent blood clots.  You may return to work once you  are authorized by your doctor.   WEIGHT BEARING: Weight bearing as tolerated with assist device (walker, cane, etc) as directed, use it as long as suggested by your surgeon or therapist, typically at least 4-6 weeks.  EXERCISES  Results after joint replacement surgery are often greatly improved when you follow  the exercise, range of motion and muscle strengthening exercises prescribed by your doctor. Safety measures are also important to protect the joint from further injury. Any time any of these exercises cause you to have increased pain or swelling, decrease what you are doing until you are comfortable again and then slowly increase them. If you have problems or questions, call your caregiver or physical therapist for advice.   Rehabilitation is important following a joint replacement. After just a few days of immobilization, the muscles of the leg can become weakened and shrink (atrophy).  These exercises are designed to build up the tone and strength of the thigh and leg muscles and to improve motion. Often times heat used for twenty to thirty minutes before working out will loosen up your tissues and help with improving the range of motion but do not use heat for the first two weeks following surgery (sometimes heat can increase post-operative swelling).   These exercises can be done on a training (exercise) mat, on the floor, on a table or on a bed. Use whatever works the best and is most comfortable for you.    Use music or television while you are exercising so that the exercises are a pleasant break in your day. This will make your life better with the exercises acting as a break in your routine that you can look forward to.   Perform all exercises about fifteen times, three times per day or as directed.  You should exercise both the operative leg and the other leg as well.  Exercises include:   Quad Sets - Tighten up the muscle on the front of the thigh (Quad) and hold for 5-10 seconds.   Straight Leg Raises - With your knee straight (if you were given a brace, keep it on), lift the leg to 60 degrees, hold for 3 seconds, and slowly lower the leg.  Perform this exercise against resistance later as your leg gets stronger.  Leg Slides: Lying on your back, slowly slide your foot toward your buttocks,  bending your knee up off the floor (only go as far as is comfortable). Then slowly slide your foot back down until your leg is flat on the floor again.  Angel Wings: Lying on your back spread your legs to the side as far apart as you can without causing discomfort.  Hamstring Strength:  Lying on your back, push your heel against the floor with your leg straight by tightening up the muscles of your buttocks.  Repeat, but this time bend your knee to a comfortable angle, and push your heel against the floor.  You may put a pillow under the heel to make it more comfortable if necessary.   A rehabilitation program following joint replacement surgery can speed recovery and prevent re-injury in the future due to weakened muscles. Contact your doctor or a physical therapist for more information on knee rehabilitation.   CONSTIPATION:  Constipation is defined medically as fewer than three stools per week and severe constipation as less than one stool per week.  Even if you have a regular bowel pattern at home, your normal regimen is likely to be disrupted due to multiple reasons  following surgery.  Combination of anesthesia, postoperative narcotics, change in appetite and fluid intake all can affect your bowels.   YOU MUST use at least one of the following options; they are listed in order of increasing strength to get the job done.  They are all available over the counter, and you may need to use some, POSSIBLY even all of these options:    Drink plenty of fluids (prune juice may be helpful) and high fiber foods Colace 100 mg by mouth twice a day  Senokot for constipation as directed and as needed Dulcolax (bisacodyl), take with full glass of water   Miralax  (polyethylene glycol) once or twice a day as needed.  If you have tried all these things and are unable to have a bowel movement in the first 3-4 days after surgery call either your surgeon or your primary doctor.    If you experience loose stools or  diarrhea, hold the medications until you stool forms back up.  If your symptoms do not get better within 1 week or if they get worse, check with your doctor.  If you experience "the worst abdominal pain ever" or develop nausea or vomiting, please contact the office immediately for further recommendations for treatment.  ITCHING:  If you experience itching with your medications, try taking only a single pain pill, or even half a pain pill at a time.  You can also use Benadryl  over the counter for itching or also to help with sleep.   TED HOSE STOCKINGS:  Use stockings on both legs until for at least 2 weeks or as directed by physician office. They may be removed at night for sleeping.  MEDICATIONS:  See your medication summary on the "After Visit Summary" that nursing will review with you.  You may have some home medications which will be placed on hold until you complete the course of blood thinner medication.  It is important for you to complete the blood thinner medication as prescribed.  Blood clot prevention (DVT Prophylaxis): After surgery you are at an increased risk for a blood clot. You were prescribed a blood thinner, Aspirin  81mg , to be taken daily for a total of 4 weeks from surgery to help reduce your risk of getting a blood clot.  This is in addition to restarting your Plavix  three days after surgery,which is to be taken as regularly prescribed.  Signs of a pulmonary embolus (blood clot in the lungs) include sudden short of breath, feeling lightheaded or dizzy, chest pain with a deep breath, rapid pulse rapid breathing.  Signs of a blood clot in your arms or legs include new unexplained swelling and cramping, warm, red or darkened skin around the painful area.  Please call the office or 911 right away if these signs or symptoms develop.  PRECAUTIONS:   If you experience chest pain or shortness of breath - call 911 immediately for transfer to the hospital emergency department.   If you  develop a fever greater that 101 F, purulent drainage from wound, increased redness or drainage from wound, foul odor from the wound/dressing, or calf pain - CONTACT YOUR SURGEON.                                                   FOLLOW-UP APPOINTMENTS:  If you do not already have a post-op appointment, please  call the office for an appointment to be seen by your surgeon.  Guidelines for how soon to be seen are listed in your "After Visit Summary", but are typically between 2-3 weeks after surgery.  If you have a specialized bandage, you may be told to follow up 1 week after surgery.  POST-OPERATIVE OPIOID TAPER INSTRUCTIONS: It is important to wean off of your opioid medication as soon as possible. If you do not need pain medication after your surgery it is ok to stop day one. Opioids include: Codeine, Hydrocodone (Norco, Vicodin), Oxycodone (Percocet, oxycontin ) and hydromorphone  amongst others.  Long term and even short term use of opiods can cause: Increased pain response Dependence Constipation Depression Respiratory depression And more.  Withdrawal symptoms can include Flu like symptoms Nausea, vomiting And more Techniques to manage these symptoms Hydrate well Eat regular healthy meals Stay active Use relaxation techniques(deep breathing, meditating, yoga) Do Not substitute Alcohol  to help with tapering If you have been on opioids for less than two weeks and do not have pain than it is ok to stop all together.  Plan to wean off of opioids This plan should start within one week post op of your joint replacement. Maintain the same interval or time between taking each dose and first decrease the dose.  Cut the total daily intake of opioids by one tablet each day Next start to increase the time between doses. The last dose that should be eliminated is the evening dose.   MAKE SURE YOU:  Understand these instructions.  Get help right away if you are not doing well or get worse.     Thank you for letting us  be a part of your medical care team.  It is a privilege we respect greatly.  We hope these instructions will help you stay on track for a fast and full recovery!            Signed: Albertus Alt 02/28/2024, 8:57 AM

## 2024-02-28 NOTE — Progress Notes (Signed)
 Physical Therapy Treatment Patient Details Name: Chris [name] MRN: [id number] DOB: [birth year] Today's Date: 02/28/2024   History of Present Illness Pt admitted with R subtrochanteric fx non-union, pathologic fs from suspected lung mets and now s/p hemi arthroplasty by posterior approach.  Pt wtih hx of CAD, COPD, MI and metastatic CA    PT Comments  Pt continues very motivated and progressing well with mobility.  Pt performed therex program with assist, up to ambulate increased distance in hall, negotiated stairs, and reviewed car transfers.  Pt eager for dc home this date.  Spouse present for session and advises HHPT will be in this weekend.    If plan is discharge home, recommend the following: A little help with walking and/or transfers;A little help with bathing/dressing/bathroom;Assistance with cooking/housework;Assist for transportation   Can travel by private vehicle        Equipment Recommendations  None recommended by PT    Recommendations for Other Services       Precautions / Restrictions Precautions Precautions: Fall;Posterior Hip Precaution Booklet Issued: Yes (comment) Precaution/Restrictions Comments: Pt recalls 1/3 THP - all THP reviewed Restrictions Weight Bearing Restrictions Per Provider Order: No Other Position/Activity Restrictions: WBAT     Mobility  Bed Mobility Overal bed mobility: Needs Assistance Bed Mobility: Supine to Sit     Supine to sit: Min assist     General bed mobility comments: Increased time with cues for sequence and use of L LE to self assist    Transfers Overall transfer level: Needs assistance Equipment used: Rolling walker (2 wheels) Transfers: Sit to/from Stand Sit to Stand: Contact guard assist           General transfer comment: cues for LE management, use of UEs to self assist and adherence to THP    Ambulation/Gait Ambulation/Gait assistance: Contact guard assist Gait Distance (Feet): 150 Feet Assistive  device: Rolling walker (2 wheels) Gait Pattern/deviations: Step-to pattern, Decreased step length - right, Decreased step length - left, Shuffle, Trunk flexed Gait velocity: decr     General Gait Details: cues for sequence, posture and position from RW   Stairs Stairs: Yes Stairs assistance: Min assist Stair Management: One rail Left, Step to pattern, Forwards, With cane Number of Stairs: 3 General stair comments: cues for sequence and foot/cane placement   Wheelchair Mobility     Tilt Bed    Modified Rankin (Stroke Patients Only)       Balance Overall balance assessment: Needs assistance Sitting-balance support: No upper extremity supported, Feet supported Sitting balance-Leahy Scale: Good     Standing balance support: No upper extremity supported Standing balance-Leahy Scale: Fair                              Hotel manager: No apparent difficulties  Cognition Arousal: Alert Behavior During Therapy: WFL for tasks assessed/performed   PT - Cognitive impairments: No apparent impairments                         Following commands: Intact      Cueing Cueing Techniques: Verbal cues, Visual cues  Exercises Total Joint Exercises Ankle Circles/Pumps: AROM, Both, 15 reps, Supine Quad Sets: AROM, Both, 10 reps, Supine Heel Slides: AAROM, Right, Supine, 20 reps Hip ABduction/ADduction: AAROM, Right, 15 reps, Supine Long Arc Quad: AAROM, Right, 10 reps, Seated    General Comments        Pertinent Vitals/Pain  Pain Assessment Pain Assessment: 0-10 Pain Score: 3  Pain Location: R hip Pain Descriptors / Indicators: Aching, Sore Pain Intervention(s): Limited activity within patient's tolerance, Monitored during session, Premedicated before session, Ice applied    Home Living                          Prior Function            PT Goals (current goals can now be found in the care plan section) Acute  Rehab PT Goals Patient Stated Goal: Regain IND PT Goal Formulation: With patient Time For Goal Achievement: 03/05/24 Potential to Achieve Goals: Good Progress towards PT goals: Progressing toward goals    Frequency    7X/week      PT Plan      Co-evaluation              AM-PAC PT "6 Clicks" Mobility   Outcome Measure  Help needed turning from your back to your side while in a flat bed without using bedrails?: A Little Help needed moving from lying on your back to sitting on the side of a flat bed without using bedrails?: A Little Help needed moving to and from a bed to a chair (including a wheelchair)?: A Little Help needed standing up from a chair using your arms (e.g., wheelchair or bedside chair)?: A Little Help needed to walk in hospital room?: A Little Help needed climbing 3-5 steps with a railing? : A Little 6 Click Score: 18    End of Session Equipment Utilized During Treatment: Gait belt Activity Tolerance: Patient tolerated treatment well Patient left: in chair;with call bell/phone within reach;with chair alarm set;with family/visitor present Nurse Communication: Mobility status PT Visit Diagnosis: Difficulty in walking, not elsewhere classified (R26.2)     Time: 8469-6295 PT Time Calculation (min) (ACUTE ONLY): 38 min  Charges:    $Gait Training: 8-22 mins $Therapeutic Exercise: 8-22 mins $Therapeutic Activity: 8-22 mins PT General Charges $$ ACUTE PT VISIT: 1 Visit                     Thedora Finlay PT Acute Rehabilitation Services Pager [phone] Office [phone]    Chris [name] 02/28/2024, 11:33 AM

## 2024-02-28 NOTE — Plan of Care (Signed)

## 2024-02-28 NOTE — Care Management Important Message (Signed)
 Important Message  Patient Details IM Letter given. Name: Chris [name] MRN: [id number] Date of Birth: Feb 12, 1961   Important Message Given:  Yes - Medicare IM     Curtiss Dowdy 02/28/2024, 9:42 AM

## 2024-02-28 NOTE — Progress Notes (Signed)
 Discharge instructions given to patient and wife questions ask and answered D Susann Givens RN

## 2024-02-28 NOTE — Progress Notes (Signed)
    2 Days Post-Op Procedure(s) (LRB): RESECTION PROXIMAL BONE TUMOR FEMUR, HEMIARTHROPLASTY, LOCAL TISSUE ADVANCEMENT (Right)  Subjective:  Patient reports pain as mild -- much improved in comparison to before surgery.  Looks good this morning. Denies distal n/t. Mobilized with PT, walked 23 ft + 95 ft with RW.  Hopeful for discharge today.  No overnight events.  Objective:   VITALS:   Vitals:   02/27/24 2041 02/27/24 2050 02/28/24 0545 02/28/24 0742  BP: 99/63  (!) 141/85 (!) 143/83  Pulse: 78 72 96 86  Resp: 16 20 15 18   Temp: 98.1 F (36.7 C)  98.7 F (37.1 C) 98.3 F (36.8 C)  TempSrc:   Oral Oral  SpO2: 97% 96% 97% 97%  Weight:      Height:        AAOx4, sitting comfortably in chair, in NAD Sensation intact distally Intact pulses distally Dorsiflexion/Plantar flexion intact Incision: dressing C/D/I Compartment soft Wiggles toes appropriately   Lab Results  Component Value Date   WBC 7.6 02/28/2024   HGB 11.9 (L) 02/28/2024   HCT 36.2 (L) 02/28/2024   MCV 89.6 02/28/2024   PLT 155 02/28/2024   BMET    Component Value Date/Time   NA 135 02/27/2024 0334   K 3.6 02/27/2024 0334   CL 104 02/27/2024 0334   CO2 21 (L) 02/27/2024 0334   GLUCOSE 136 (H) 02/27/2024 0334   BUN 12 02/27/2024 0334   CREATININE 1.02 02/27/2024 0334   CREATININE 1.07 08/27/2023 1331   CALCIUM  8.4 (L) 02/27/2024 0334   GFRNONAA >60 02/27/2024 0334   GFRNONAA >60 08/27/2023 1331     Xray: PFR components in good position, no adverse features  Assessment/Plan: 2 Days Post-Op   Principal Problem:   Closed fracture of right hip requiring operative repair with nonunion, subsequent encounter  S/p R proximal femur resection with hardware removal a conversion to proximal femur replacement 02/26/24  5/2: Did well mobilizing with PT.  Hgb appears stable.  Hemodynamically stable.  Likely DC home with HHPT today.  Post op recs: WB: WBAT RLE, 6 weeks posterior hip precautions Abx:  ancef  Imaging: PACU pelvis Xray Dressing: Aquacell, keep intact until follow up DVT prophylaxis: Aspirin  81BID starting POD1, resume plavix  POD2` Follow up: 2 weeks after surgery for a wound check with Dr. Pryor Browning at [facility].  Address: [address], [place], [place] [address]  Office Phone: [phone]   Albertus Alt 02/28/2024, 8:16 AM    Contact information:   Weekdays 7am-5pm epic message Dr. Pryor Browning, or call office for patient follow up: [phone] After hours and holidays please check [email or web address] for group call information for [facility]

## 2024-03-03 ENCOUNTER — Ambulatory Visit: Admitting: Cardiovascular Disease

## 2024-03-11 ENCOUNTER — Encounter: Payer: Self-pay | Admitting: Internal Medicine

## 2024-03-27 ENCOUNTER — Telehealth: Payer: Self-pay | Admitting: Internal Medicine

## 2024-03-27 ENCOUNTER — Encounter: Payer: Self-pay | Admitting: Internal Medicine

## 2024-03-27 ENCOUNTER — Other Ambulatory Visit: Payer: Self-pay | Admitting: *Deleted

## 2024-03-27 DIAGNOSIS — C3411 Malignant neoplasm of upper lobe, right bronchus or lung: Secondary | ICD-10-CM

## 2024-03-27 NOTE — Telephone Encounter (Signed)
 Pt spouse called and wants to schedule appt for pt with Dr.B. the next opening is June 27, they want to be seen sooner than that. Please advise on scheduling. Thank you

## 2024-04-07 ENCOUNTER — Ambulatory Visit

## 2024-04-10 ENCOUNTER — Ambulatory Visit: Admitting: Internal Medicine

## 2024-04-10 ENCOUNTER — Ambulatory Visit
Admission: RE | Admit: 2024-04-10 | Discharge: 2024-04-10 | Disposition: A | Discharge status: Home / Self Care | Source: Ambulatory Visit | Attending: Internal Medicine | Admitting: Internal Medicine

## 2024-04-10 DIAGNOSIS — R519 Headache, unspecified: Secondary | ICD-10-CM

## 2024-04-10 DIAGNOSIS — C3411 Malignant neoplasm of upper lobe, right bronchus or lung: Secondary | ICD-10-CM

## 2024-04-10 MED ORDER — IOPAMIDOL (ISOVUE-300) INJECTION 61%
100.0000 mL | Freq: Once | INTRAVENOUS | Status: AC | PRN
  Administered 2024-04-10: 100 mL via INTRAVENOUS

## 2024-04-10 MED ORDER — IOHEXOL 300 MG/ML  SOLN: 100.0000 mL | Freq: Once | INTRAMUSCULAR | Status: DC | PRN

## 2024-04-13 ENCOUNTER — Telehealth: Payer: Self-pay | Admitting: Internal Medicine

## 2024-04-13 ENCOUNTER — Inpatient Hospital Stay: Attending: Internal Medicine | Admitting: Internal Medicine

## 2024-04-13 ENCOUNTER — Encounter: Payer: Self-pay | Admitting: Internal Medicine

## 2024-04-13 NOTE — Telephone Encounter (Signed)
 Patients wife called- the results for his CT scan went to [facility]- she said it tells him a PET is recommended. She is asking for a call to let her know what to do next. She is crying at the time of this call.

## 2024-04-13 NOTE — Telephone Encounter (Signed)
 Pt spoke to wife- re: CT scan- recommend wife gets the PSA records from PCP [normal as per wife]- will discuss further at next visit- GB  FYI-

## 2024-04-15 ENCOUNTER — Encounter: Payer: Self-pay | Admitting: Internal Medicine

## 2024-04-18 ENCOUNTER — Other Ambulatory Visit: Payer: Self-pay | Admitting: Hospice and Palliative Medicine

## 2024-04-20 ENCOUNTER — Encounter: Payer: Self-pay | Admitting: Internal Medicine

## 2024-04-25 ENCOUNTER — Encounter: Payer: Self-pay | Admitting: Hospice and Palliative Medicine

## 2024-04-28 ENCOUNTER — Other Ambulatory Visit: Payer: Self-pay | Admitting: Hospice and Palliative Medicine

## 2024-04-28 ENCOUNTER — Inpatient Hospital Stay: Attending: Internal Medicine | Admitting: Internal Medicine

## 2024-04-28 ENCOUNTER — Telehealth: Payer: Self-pay

## 2024-04-28 ENCOUNTER — Encounter: Payer: Self-pay | Admitting: Internal Medicine

## 2024-04-28 VITALS — BP 94/75 | HR 80 | Temp 97.4°F | Resp 20 | Ht 71.0 in | Wt 239.0 lb

## 2024-04-28 DIAGNOSIS — Z79899 Other long term (current) drug therapy: Secondary | ICD-10-CM | POA: Insufficient documentation

## 2024-04-28 DIAGNOSIS — I10 Essential (primary) hypertension: Secondary | ICD-10-CM | POA: Diagnosis not present

## 2024-04-28 DIAGNOSIS — F17210 Nicotine dependence, cigarettes, uncomplicated: Secondary | ICD-10-CM | POA: Insufficient documentation

## 2024-04-28 DIAGNOSIS — Z85828 Personal history of other malignant neoplasm of skin: Secondary | ICD-10-CM | POA: Diagnosis not present

## 2024-04-28 DIAGNOSIS — Z803 Family history of malignant neoplasm of breast: Secondary | ICD-10-CM | POA: Diagnosis not present

## 2024-04-28 DIAGNOSIS — C787 Secondary malignant neoplasm of liver and intrahepatic bile duct: Secondary | ICD-10-CM | POA: Insufficient documentation

## 2024-04-28 DIAGNOSIS — K219 Gastro-esophageal reflux disease without esophagitis: Secondary | ICD-10-CM | POA: Diagnosis not present

## 2024-04-28 DIAGNOSIS — C7951 Secondary malignant neoplasm of bone: Secondary | ICD-10-CM | POA: Insufficient documentation

## 2024-04-28 DIAGNOSIS — C3411 Malignant neoplasm of upper lobe, right bronchus or lung: Secondary | ICD-10-CM | POA: Insufficient documentation

## 2024-04-28 DIAGNOSIS — Z7902 Long term (current) use of antithrombotics/antiplatelets: Secondary | ICD-10-CM | POA: Insufficient documentation

## 2024-04-28 DIAGNOSIS — J449 Chronic obstructive pulmonary disease, unspecified: Secondary | ICD-10-CM | POA: Diagnosis not present

## 2024-04-28 MED ORDER — OXYCODONE HCL 10 MG PO TABS: 5.0000 mg | ORAL_TABLET | ORAL | 0 refills | Status: DC | PRN

## 2024-04-28 NOTE — Progress Notes (Signed)
 [facility] CONSULT NOTE  Patient Care Team: Weyman Bright, MD as PCP - General (Internal Medicine) Court Dorn PARAS, MD as PCP - Cardiology (Cardiology) Sherrod Sherrod, MD as Consulting Physician (Oncology) Weyman Bright, MD as Referring Physician (Internal Medicine) Rennie Cindy SAUNDERS, MD as Consulting Physician (Internal Medicine)  CHIEF COMPLAINTS/PURPOSE OF CONSULTATION: lung cancer  #  Oncology History Overview Note  DIAGNOSIS: 1) stage IV (T1c, N0, M1 C) non-small cell lung cancer favoring adenocarcinoma presented with right lung apical nodule in addition to metastatic disease in the right hepatic lobe and the proximal right femoral diaphysis diagnosed and December 2022. 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    QNS; Guardant 360 showed positive KRAS G12C mutation  FINAL MICROSCOPIC DIAGNOSIS:   A. LUNG, RUL, FINE NEEDLE ASPIRATION:  - Malignant cells consistent with non-small cell carcinoma, see comment   B. LUNG, RUL, BRUSHING:  - Malignant cells consistent with non-small cell carcinoma, see comment       COMMENT:   A and B.  Dr. Belvie reviewed the case and concurs with the diagnosis.  Only rare malignant cells are present on the cellblock.  Immunohistochemical stains were attempted and show that the tumor cells  have patchy staining for TTF-1 whereas p63, p40 and CK5/6 are negative.  The findings are nondiagnostic but suggestive of a lung adenocarcinoma.  Dr. Shelah was notified on 10/13/2021.   SEP-OCT 2022-  [Dermatology]-   Poorly differentiated squamous cell carcinoma; -  Carcinoma extends to the edges of the excision specimen   IMPRESSION: 1. The spiculated nodule at the right lung apex on recent neck CT is hypermetabolic and is concerning for primary bronchogenic carcinoma. Tissue sampling recommended. 2. Hypermetabolic activity within the  occipital scalp and small previously demonstrated right occipital lymph node compatible with known squamous cell carcinoma. Evaluation of the head and neck limited by motion artifact. 3. Hypermetabolic lesions inferiorly in the right hepatic lobe and in the proximal right femoral diaphysis suspicious for metastatic disease, primary uncertain in this patient with a history of prostate cancer. Correlate with PSA levels. Abdominal MRI without and with contrast may be helpful for further characterization of the liver lesion.  # s/p RT to RUL [[place]]; right hip RT; # BONE METASTASES:  Impending right hip fracture right hip pain-status post radiation [[facility]; DEC 2022]- S/p  Re-RT on 10/03 x5 Fx. NOV 2023-status post intramedullary nail fixation;JUNE 2024 [[place]] Re-surgery-    Awaiting dental clearance for zometa.    # 2007MI-CAD [s/p stent-Dr.Berry; EF 2021- 58%]     Primary adenocarcinoma of upper lobe of right lung (HCC)  10/18/2021 Initial Diagnosis   Primary adenocarcinoma of upper lobe of right lung (HCC)   10/18/2021 Cancer Staging   Staging form: Lung, AJCC 8th Edition - Clinical: Stage IVB (cT1c, cN0, cM1c) - Signed by Sherrod Sherrod, MD on 10/18/2021   11/01/2021 - 05/30/2022 Chemotherapy   Patient is on Treatment Plan : LUNG NSCLC Pemetrexed  + Carboplatin  q21d x 4 Cycles     11/01/2021 -  Chemotherapy   Patient is on Treatment Plan : LUNG NSCLC Libtayo  q  21d       HISTORY OF PRESENTING ILLNESS: Ambulating with a cane.  Accompanied by his wife.  Chris [name] 63 y.o.  male metastatic stage IV non-small cell lung cancer/favor adenocarcinoma, currently OFF maintenance libtayo /on surveillance, returns to clinic for follow up/a dn review the results of the CT  scan.  Patient had femur/hip surgery 8 weeks ago.  States his pain is improved not resolved.  He continues to be on oxycodone  as needed.   Patient has poor appetite.  Loss of weight-secondary to his extraction.  Patient  complains of onging fatigue.    Review of Systems  Constitutional:  Positive for malaise/fatigue. Negative for chills, diaphoresis, fever and weight loss.  HENT:  Positive for hearing loss. Negative for nosebleeds and sore throat.   Eyes:  Negative for double vision.  Respiratory:  Negative for cough, hemoptysis, sputum production, shortness of breath and wheezing.   Cardiovascular:  Negative for chest pain, palpitations, orthopnea and leg swelling.  Gastrointestinal:  Negative for abdominal pain, blood in stool, diarrhea, heartburn, melena, nausea and vomiting.  Genitourinary:  Negative for dysuria, frequency and urgency.  Musculoskeletal:  Positive for back pain and joint pain.  Skin: Negative.  Negative for itching and rash.  Neurological:  Negative for dizziness, tingling, focal weakness, weakness and headaches.  Endo/Heme/Allergies:  Does not bruise/bleed easily.  Psychiatric/Behavioral:  Negative for depression. The patient does not have insomnia.      MEDICAL HISTORY:  Past Medical History:  Diagnosis Date   Anxiety    associated with medical care,  worsened by difficulty hearing, does better with wife present   Arthritis    Cancer associated pain    Cancer, metastatic to liver [facility])    Complication of anesthesia    wife states very anxious may need pre sedation   COPD (chronic obstructive pulmonary disease) (HCC)    Coronary artery disease    Dyspnea    GERD (gastroesophageal reflux disease)    Hearing loss    no hearing aids per wife   Hyperlipidemia    Hypertension    MI (myocardial infarction) (HCC) 04/17/2006   Acute inferolateral wall MI with cardiogenic shock and complete heart block   Primary adenocarcinoma of upper lobe of right lung (HCC)    Prostate cancer (HCC)    Sleep apnea    Tobacco abuse    Wears glasses    Wears partial dentures    Upper    SURGICAL HISTORY: Past Surgical History:  Procedure Laterality Date   BRONCHIAL BIOPSY  10/09/2021    Procedure: BRONCHIAL BIOPSIES;  Surgeon: Shelah Lamar RAMAN, MD;  Location: [facility];  Service: Pulmonary;;   BRONCHIAL BRUSHINGS  10/09/2021   Procedure: BRONCHIAL BRUSHINGS;  Surgeon: Shelah Lamar RAMAN, MD;  Location: [facility];  Service: Pulmonary;;   BRONCHIAL NEEDLE ASPIRATION BIOPSY  10/09/2021   Procedure: BRONCHIAL NEEDLE ASPIRATION BIOPSIES;  Surgeon: Shelah Lamar RAMAN, MD;  Location: [facility];  Service: Pulmonary;;   CARDIAC CATHETERIZATION  2007   left, RCA 100% occluded ruptured plaque with thrombus in the proximal segment   CONVERSION TO TOTAL HIP Right 02/26/2024   Procedure: RESECTION PROXIMAL BONE TUMOR FEMUR, HEMIARTHROPLASTY, LOCAL TISSUE ADVANCEMENT;  Surgeon: Edna Toribio LABOR, MD;  Location: [facility];  Service: Orthopedics;  Laterality: Right;   CYST EXCISION N/A 08/30/2021   Procedure: EXCISION OF POSTERIOR SCALP CYST;  Surgeon: Signe Mitzie LABOR, MD;  Location: [facility];  Service: General;  Laterality: N/A;   CYSTOSCOPY N/A 07/17/2018   Procedure: PHYLLIS;  Surgeon: Matilda Senior, MD;  Location: [facility];  Service: Urology;  Laterality: N/A;  no seeds in bladder per Dr Matilda   FEMUR IM NAIL Right 04/03/2023   Procedure: REVISION FIXATION OF RIGHT FEMUR NONUNION;  Surgeon: Kendal Franky SQUIBB, MD;  Location: [facility];  Service: Orthopedics;  Laterality: Right;   FIDUCIAL MARKER PLACEMENT  10/09/2021   Procedure: FIDUCIAL MARKER PLACEMENT;  Surgeon: Shelah Lamar RAMAN, MD;  Location: [facility];  Service: Pulmonary;;   HAND SURGERY Right    has metal plate in arm   HARDWARE REMOVAL Right 04/03/2023   Procedure: REMOVAL OF PREVIOUS HARDWARE FEMUR;  Surgeon: Kendal Franky SQUIBB, MD;  Location: [facility];  Service: Orthopedics;  Laterality: Right;   PROSTATE BIOPSY     RADIOACTIVE SEED IMPLANT N/A 07/17/2018   Procedure: RADIOACTIVE SEED IMPLANT/BRACHYTHERAPY IMPLANT;  Surgeon: Matilda Senior, MD;  Location: [facility];  Service: Urology;   Laterality: N/A;  77 seeds   SPACE OAR INSTILLATION N/A 07/17/2018   Procedure: SPACE OAR INSTILLATION;  Surgeon: Matilda Senior, MD;  Location: [facility];  Service: Urology;  Laterality: N/A;   VIDEO BRONCHOSCOPY WITH RADIAL ENDOBRONCHIAL ULTRASOUND  10/09/2021   Procedure: VIDEO BRONCHOSCOPY WITH RADIAL ENDOBRONCHIAL ULTRASOUND;  Surgeon: Shelah Lamar RAMAN, MD;  Location: [facility];  Service: Pulmonary;;   WRIST SURGERY  10/04/2012    SOCIAL HISTORY: Social History   Socioeconomic History   Marital status: Married    Spouse name: Not on file   Number of children: Not on file   Years of education: Not on file   Highest education level: Not on file  Occupational History   Not on file  Tobacco Use   Smoking status: Every Day    Current packs/day: 0.75    Average packs/day: 0.8 packs/day for 42.0 years (31.5 ttl pk-yrs)    Types: Cigarettes   Smokeless tobacco: Never   Tobacco comments:    Smokes 0.25 PPD - [facility] - 02/12/24     Started smoking at 63 years old    Smoked 2 PPD at his heaviest.   Vaping Use   Vaping status: Never Used  Substance and Sexual Activity   Alcohol  use: No   Drug use: No   Sexual activity: Yes    Birth control/protection: None  Other Topics Concern   Not on file  Social History Narrative   10-04 19 Unable to ask abuse questions wife with him today.   Are you right handed or left handed? Ambidextrous prominent right   Are you currently employed ? 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 no   Who lives with you? Wife and patient   What type of home do you live in: 1 story or 2 story? 1 story       Social Drivers of Corporate investment banker Strain: Not on file  Food Insecurity: No Food Insecurity (02/26/2024)   Hunger Vital Sign    Worried About Running Out of Food in the Last Year: Never true    Ran Out of Food in the Last Year: Never true  Transportation Needs: No  Transportation Needs (02/26/2024)   PRAPARE - Administrator, Civil Service (Medical): No    Lack of Transportation (Non-Medical): No  Physical Activity: Not on file  Stress: Not on file  Social Connections: Not on file  Intimate Partner Violence: Not At Risk (02/26/2024)   Humiliation, Afraid, Rape, and Kick questionnaire    Fear of Current or Ex-Partner: No    Emotionally Abused: No    Physically Abused: No    Sexually Abused: No    FAMILY HISTORY: Family History  Problem Relation Age of Onset   Breast cancer  Mother    Prostate cancer Neg Hx    Kidney cancer Neg Hx    Cancer Neg Hx     ALLERGIES:  is allergic to pollen extract.  MEDICATIONS:  Current Outpatient Medications  Medication Sig Dispense Refill   albuterol  (PROVENTIL ) (2.5 MG/3ML) 0.083% nebulizer solution Take 3 mLs (2.5 mg total) by nebulization every 6 (six) hours as needed for wheezing or shortness of breath. 75 mL 12   atorvastatin  (LIPITOR) 40 MG tablet Take 1 tablet (40 mg total) by mouth daily. 90 tablet 3   Budeson-Glycopyrrol-Formoterol  (BREZTRI  AEROSPHERE) 160-9-4.8 MCG/ACT AERO Inhale 2 puffs into the lungs in the morning and at bedtime. 10.7 g 6   clopidogrel  (PLAVIX ) 75 MG tablet Take 1 tablet (75 mg total) by mouth daily.     fluticasone  (FLONASE ) 50 MCG/ACT nasal spray Place 1 spray into both nostrils daily as needed for allergies.     ketoconazole (NIZORAL) 2 % cream Apply 1 application  topically 2 (two) times daily as needed for irritation.     metoprolol  tartrate (LOPRESSOR ) 25 MG tablet Take 1 tablet (25 mg total) by mouth daily. 90 tablet 3   Nebulizers (COMPRESSOR/NEBULIZER) MISC Use as directed for nebulizer medication. 1 each 0   oxyCODONE  ER (XTAMPZA  ER) 9 MG C12A Take 9 mg by mouth in the morning and at bedtime.     Oxycodone  HCl 10 MG TABS Take 0.5-1 tablets (5-10 mg total) by mouth every 4 (four) hours as needed. 60 tablet 0   pantoprazole  (PROTONIX ) 40 MG tablet Take 1 tablet  (40 mg total) by mouth daily. 60 tablet 1   polyethylene glycol (MIRALAX ) 17 g packet Take 17 g by mouth daily. 14 each 0   sertraline  (ZOLOFT ) 100 MG tablet TAKE 1 TABLET BY MOUTH EVERY DAY (Patient taking differently: Take 50 mg by mouth daily.) 90 tablet 0   tamsulosin  (FLOMAX ) 0.4 MG CAPS capsule Take 1 capsule (0.4 mg total) by mouth daily. 30 capsule 3   Vitamin D , Ergocalciferol , (DRISDOL ) 1.25 MG (50000 UNIT) CAPS capsule TAKE 1 CAPSULE BY MOUTH ONE TIME PER WEEK 12 capsule 1   ALPRAZolam  (XANAX ) 0.25 MG tablet Take 1 tablet (0.25 mg total) by mouth at bedtime. (Patient not taking: Reported on 04/28/2024) 15 tablet 0   sennosides-docusate sodium  (SENOKOT-S) 8.6-50 MG tablet Take 1 tablet by mouth daily. (Patient not taking: Reported on 04/28/2024) 60 tablet 1   No current facility-administered medications for this visit.    PHYSICAL EXAMINATION: ECOG PERFORMANCE STATUS: 1 - Symptomatic but completely ambulatory  Vitals:   04/28/24 1455  BP: 94/75  Pulse: 80  Resp: 20  Temp: (!) 97.4 F (36.3 C)  SpO2: 99%      Filed Weights   04/28/24 1455  Weight: 239 lb (108.4 kg)    Physical Exam Vitals reviewed.  HENT:     Head: Normocephalic and atraumatic.     Mouth/Throat:     Pharynx: Oropharynx is clear.   Eyes:     Extraocular Movements: Extraocular movements intact.     Pupils: Pupils are equal, round, and reactive to light.    Cardiovascular:     Rate and Rhythm: Normal rate and regular rhythm.  Pulmonary:     Effort: No respiratory distress.     Comments: Decreased breath sounds bilaterally.  Abdominal:     General: There is no distension.     Palpations: Abdomen is soft.   Skin:    General: Skin is warm.  Coloration: Skin is not pale.   Neurological:     Mental Status: He is alert and oriented to person, place, and time.   Psychiatric:        Mood and Affect: Mood normal.        Behavior: Behavior normal.     LABORATORY DATA:  I have reviewed the  data as listed Lab Results  Component Value Date   WBC 7.6 02/28/2024   HGB 11.9 (L) 02/28/2024   HCT 36.2 (L) 02/28/2024   MCV 89.6 02/28/2024   PLT 155 02/28/2024   Recent Labs    11/22/23 0932 12/27/23 0824 02/24/24 0948 02/27/24 0334  NA 136 138 139 135  K 3.8 4.0 4.4 3.6  CL 102 105 104 104  CO2 24 23 25  21*  GLUCOSE 130* 105* 121* 136*  BUN 18 20 12 12   CREATININE 1.09 0.97 1.01 1.02  CALCIUM  9.1 9.0 9.5 8.4*  GFRNONAA >60 >60 >60 >60  PROT 7.5 7.6 7.4  --   ALBUMIN  4.4 4.2 4.1  --   AST 18 19 19   --   ALT 23 23 21   --   ALKPHOS 75 75 66  --   BILITOT 0.7 0.6 0.8  --     RADIOGRAPHIC STUDIES: I have personally reviewed the radiological images as listed and agreed with the findings in the report. CT CHEST ABDOMEN PELVIS W CONTRAST Result Date: 04/12/2024 CLINICAL DATA:  Non-small cell lung cancer restaging * Tracking Code: BO * EXAM: CT CHEST, ABDOMEN, AND PELVIS WITH CONTRAST TECHNIQUE: Multidetector CT imaging of the chest, abdomen and pelvis was performed following the standard protocol during bolus administration of intravenous contrast. RADIATION DOSE REDUCTION: This exam was performed according to the departmental dose-optimization program which includes automated exposure control, adjustment of the mA and/or kV according to patient size and/or use of iterative reconstruction technique. CONTRAST:  100mL ISOVUE -300 IOPAMIDOL  (ISOVUE -300) INJECTION 61%, <See Chart> OMNIPAQUE  IOHEXOL  300 MG/ML SOLN COMPARISON:  12/13/2023 FINDINGS: CT CHEST FINDINGS Cardiovascular: Aortic atherosclerosis. Normal heart size. Scattered coronary artery calcifications and right coronary artery stent. No pericardial effusion. Mediastinum/Nodes: No enlarged mediastinal, hilar, or axillary lymph nodes. Thyroid  gland, trachea, and esophagus demonstrate no significant findings. Lungs/Pleura: Unchanged treated nodule of the right pulmonary apex measuring 1.2 x 0.9 cm with adjacent radiation change  (series 3, image 14). No pleural effusion or pneumothorax. Musculoskeletal: No chest wall abnormality. No acute osseous findings. CT ABDOMEN PELVIS FINDINGS Hepatobiliary: No solid liver abnormality is seen. Hepatomegaly, maximum coronal span 21.3 cm. Hepatic steatosis. No gallstones, gallbladder wall thickening, or biliary dilatation. Pancreas: Unremarkable. No pancreatic ductal dilatation or surrounding inflammatory changes. Spleen: Normal in size without significant abnormality. Adrenals/Urinary Tract: Benign bilateral adrenal adenomata, requiring no further follow-up or characterization. Small nonobstructive left renal calculi. No right-sided calculi, ureteral calculi, or hydronephrosis. Bladder is unremarkable. Stomach/Bowel: Stomach is within normal limits. Appendix appears normal. No evidence of bowel wall thickening, distention, or inflammatory changes. Sigmoid diverticulosis. Vascular/Lymphatic: Aortic atherosclerosis. Newly enlarged right iliac lymph nodes measuring up to 1.1 x 0.9 cm (series 2, image 101). Reproductive: Prostate brachytherapy. Other: No abdominal wall hernia or abnormality. No ascites. Musculoskeletal: No acute osseous findings. Status post right hip total arthroplasty. IMPRESSION: 1. Unchanged treated nodule of the right pulmonary apex measuring 1.2 x 0.9 cm with adjacent radiation change. 2. Newly enlarged right iliac lymph nodes measuring up to 1.1 x 0.9 cm, suspicious for nodal metastatic disease, primarily of prostate origin given distribution. Prostate specific PET-CT could  be helpful to further assess these small lymph nodes. 3. Otherwise no evidence of lymphadenopathy or metastatic disease in the chest, abdomen, or pelvis. 4. Prostate brachytherapy. 5. Hepatomegaly and hepatic steatosis. 6. Nonobstructive left nephrolithiasis. 7. Coronary artery disease. Aortic Atherosclerosis (ICD10-I70.0). Electronically Signed   By: Marolyn JONETTA Jaksch M.D.   On: 04/12/2024 18:10    ASSESSMENT &  PLAN:    Primary adenocarcinoma of upper lobe of right lung (HCC) # Non-small cell lung cancer/stage IV-favor adeno carcinoma-metastases to right femur/liver; synchronous squamous cell carcinoma scalp  JUNE 13th, 2025-  Unchanged treated nodule of the right pulmonary apex measuring 1.2 x 0.9 cm with adjacent radiation change;  Newly enlarged right iliac lymph nodes measuring up to 1.1 x 0.9 cm, suspicious for nodal metastatic disease, primarily of prostate origin given distribution.  PSA- June 2025- < 0.1 [PCP]; Otherwise no evidence of lymphadenopathy or metastatic disease in the chest, abdomen, or pelvis.JAN 3rd, 2025- last libtayo .   # CONTINUE to HOLD Single agent Libtayo  maintenance [given the ongoing severe fatigue memory issues and overall stability of the disease]- Labs today reviewed; will continue surveillance-with imaging every 3 months or so.  # Memory loss especially short-term-question etiology- JAN 2025- MRI brain negative for any metastases. S/p evaluation Dr. Buckley- stable.    # BONE METASTASES: Hx Impending right hip fracture right hip pain-status post radiation [[facility]; DEC 2022]- S/p  Re-RT on 10/03 x5 Fx. NOV 2023-status post intramedullary nail fixation;JUNE 2024 [[place]] status post-revision surgery May 2025.  Stable.  Hold Zometa for now.  # Low vit D- [July 2023- vit D- 25]; on  ergocalciferol - Calcium  9.5 improved/stable. FEB vit D-25-OH- 41. Stable.     # Squamous cell carcinoma of the scalp-s/p excision; positive margins- on libtayo -no clinical evidence of progression.  Might need to consider radiation-if any local progression noted/also based on course lung cancer. Stable.     # COPD-recommend follow-up with pulmonary, [facility] [place]- Stable.  ? OSA- causing fatigue- defer to pulmonary-Stable.       # GERD- on prilosec continue PPI BID prior to meals-  Stable.     # weight loss: sec to teeth exatraction-   # IV access: PIV  # DISPOSITION: # follow up 3 months-  MD;labs- cbc/cmp;TSH; CT CAP-  Dr.B       All questions were answered. The patient knows to call the clinic with any problems, questions or concerns.   Cindy JONELLE Joe, MD 04/28/2024

## 2024-04-28 NOTE — Progress Notes (Signed)
 CT chest CAP.  Femur/hip surgery 8 weeks ago.  C/o being cloudy/lightheaded at times. SOB at times.  C/o of being aggravated but not sure why.

## 2024-04-28 NOTE — Assessment & Plan Note (Addendum)
#   Non-small cell lung cancer/stage IV-favor adeno carcinoma-metastases to right femur/liver; synchronous squamous cell carcinoma scalp  JUNE 13th, 2025-  Unchanged treated nodule of the right pulmonary apex measuring 1.2 x 0.9 cm with adjacent radiation change;  Newly enlarged right iliac lymph nodes measuring up to 1.1 x 0.9 cm, suspicious for nodal metastatic disease, primarily of prostate origin given distribution.  PSA- June 2025- < 0.1 [PCP]; Otherwise no evidence of lymphadenopathy or metastatic disease in the chest, abdomen, or pelvis.JAN 3rd, 2025- last libtayo .   # CONTINUE to HOLD Single agent Libtayo  maintenance [given the ongoing severe fatigue memory issues and overall stability of the disease]- Labs today reviewed; will continue surveillance-with imaging every 3 months or so.  # Memory loss especially short-term-question etiology- JAN 2025- MRI brain negative for any metastases. S/p evaluation Dr. Buckley- stable.    # BONE METASTASES: Hx Impending right hip fracture right hip pain-status post radiation [[facility]; DEC 2022]- S/p  Re-RT on 10/03 x5 Fx. NOV 2023-status post intramedullary nail fixation;JUNE 2024 [[place]] status post-revision surgery May 2025.  Stable.  Hold Zometa for now.  # Low vit D- [July 2023- vit D- 25]; on  ergocalciferol - Calcium  9.5 improved/stable. FEB vit D-25-OH- 41. Stable.     # Squamous cell carcinoma of the scalp-s/p excision; positive margins- on libtayo -no clinical evidence of progression.  Might need to consider radiation-if any local progression noted/also based on course lung cancer. Stable.     # COPD-recommend follow-up with pulmonary, [facility] [place]- Stable.  ? OSA- causing fatigue- defer to pulmonary-Stable.       # GERD- on prilosec continue PPI BID prior to meals-  Stable.     # weight loss: sec to teeth exatraction-   # IV access: PIV  # DISPOSITION: # follow up 3 months- MD;labs- cbc/cmp;TSH; CT CAP-  Dr.B

## 2024-04-28 NOTE — Telephone Encounter (Signed)
 Called and spoke with pharmacist there at the [facility] [phone], in regards to the pharmacy having a question about how the new prescription. The pharmacist on the phone said the question was about the patients diagnosis code, so I gave him the following diagnosis code, C44.42, over the phone. He said that he would get the script ready for the patient.

## 2024-04-28 NOTE — Progress Notes (Signed)
 I spoke with patient's wife.  Patient is still having occasional pain for which he takes oxycodone  as needed with good improvement.  Wife requests refill of oxycodone .  PDMP reviewed.  Patient has MD follow-up visit later this afternoon.

## 2024-05-25 ENCOUNTER — Encounter (HOSPITAL_BASED_OUTPATIENT_CLINIC_OR_DEPARTMENT_OTHER): Payer: Self-pay

## 2024-05-27 ENCOUNTER — Encounter (HOSPITAL_BASED_OUTPATIENT_CLINIC_OR_DEPARTMENT_OTHER): Payer: Self-pay | Admitting: Physical Therapy

## 2024-05-27 ENCOUNTER — Ambulatory Visit (HOSPITAL_BASED_OUTPATIENT_CLINIC_OR_DEPARTMENT_OTHER): Attending: Orthopedic Surgery | Admitting: Physical Therapy

## 2024-05-27 DIAGNOSIS — M6281 Muscle weakness (generalized): Secondary | ICD-10-CM | POA: Diagnosis present

## 2024-05-27 DIAGNOSIS — M25651 Stiffness of right hip, not elsewhere classified: Secondary | ICD-10-CM | POA: Insufficient documentation

## 2024-05-27 DIAGNOSIS — R262 Difficulty in walking, not elsewhere classified: Secondary | ICD-10-CM | POA: Diagnosis present

## 2024-05-27 DIAGNOSIS — M25551 Pain in right hip: Secondary | ICD-10-CM | POA: Insufficient documentation

## 2024-05-27 NOTE — Therapy (Signed)
 OUTPATIENT PHYSICAL THERAPY LOWER EXTREMITY EVALUATION   Patient Name: Chris [name] MRN: [id number] DOB:Oct 07, 1961, 63 y.o., male Today's Date: 05/28/2024  END OF SESSION:  PT End of Session - 05/27/24 1707     Visit Number 1    Number of Visits 14    Date for PT Re-Evaluation 07/22/24    Authorization Type MCR A&B    PT Start Time 1548    PT Stop Time 1654    PT Time Calculation (min) 66 min    Activity Tolerance Patient tolerated treatment well    Behavior During Therapy WFL for tasks assessed/performed          Past Medical History:  Diagnosis Date   Anxiety    associated with medical care,  worsened by difficulty hearing, does better with wife present   Arthritis    Cancer associated pain    Cancer, metastatic to liver (HCC)    Complication of anesthesia    wife states very anxious may need pre sedation   COPD (chronic obstructive pulmonary disease) (HCC)    Coronary artery disease    Dyspnea    GERD (gastroesophageal reflux disease)    Hearing loss    no hearing aids per wife   Hyperlipidemia    Hypertension    MI (myocardial infarction) (HCC) 04/17/2006   Acute inferolateral wall MI with cardiogenic shock and complete heart block   Primary adenocarcinoma of upper lobe of right lung (HCC)    Prostate cancer (HCC)    Sleep apnea    Tobacco abuse    Wears glasses    Wears partial dentures    Upper   Past Surgical History:  Procedure Laterality Date   BRONCHIAL BIOPSY  10/09/2021   Procedure: BRONCHIAL BIOPSIES;  Surgeon: Shelah Lamar RAMAN, MD;  Location: [facility];  Service: Pulmonary;;   BRONCHIAL BRUSHINGS  10/09/2021   Procedure: BRONCHIAL BRUSHINGS;  Surgeon: Shelah Lamar RAMAN, MD;  Location: [facility];  Service: Pulmonary;;   BRONCHIAL NEEDLE ASPIRATION BIOPSY  10/09/2021   Procedure: BRONCHIAL NEEDLE ASPIRATION BIOPSIES;  Surgeon: Shelah Lamar RAMAN, MD;  Location: [facility];  Service: Pulmonary;;   CARDIAC CATHETERIZATION  2007   left, RCA  100% occluded ruptured plaque with thrombus in the proximal segment   CONVERSION TO TOTAL HIP Right 02/26/2024   Procedure: RESECTION PROXIMAL BONE TUMOR FEMUR, HEMIARTHROPLASTY, LOCAL TISSUE ADVANCEMENT;  Surgeon: Edna Toribio LABOR, MD;  Location: [facility];  Service: Orthopedics;  Laterality: Right;   CYST EXCISION N/A 08/30/2021   Procedure: EXCISION OF POSTERIOR SCALP CYST;  Surgeon: Signe Mitzie LABOR, MD;  Location: [facility];  Service: General;  Laterality: N/A;   CYSTOSCOPY N/A 07/17/2018   Procedure: PHYLLIS;  Surgeon: Matilda Senior, MD;  Location: [facility];  Service: Urology;  Laterality: N/A;  no seeds in bladder per Dr Matilda   FEMUR IM NAIL Right 04/03/2023   Procedure: REVISION FIXATION OF RIGHT FEMUR NONUNION;  Surgeon: Kendal Franky SQUIBB, MD;  Location: [facility];  Service: Orthopedics;  Laterality: Right;   FIDUCIAL MARKER PLACEMENT  10/09/2021   Procedure: FIDUCIAL MARKER PLACEMENT;  Surgeon: Shelah Lamar RAMAN, MD;  Location: [facility] ENDOSCOPY;  Service: Pulmonary;;   HAND SURGERY Right    has metal plate in arm   HARDWARE REMOVAL Right 04/03/2023   Procedure: REMOVAL OF PREVIOUS HARDWARE FEMUR;  Surgeon: Kendal Franky SQUIBB, MD;  Location: [facility];  Service: Orthopedics;  Laterality: Right;   PROSTATE BIOPSY     RADIOACTIVE SEED IMPLANT  N/A 07/17/2018   Procedure: RADIOACTIVE SEED IMPLANT/BRACHYTHERAPY IMPLANT;  Surgeon: Matilda Senior, MD;  Location: [facility];  Service: Urology;  Laterality: N/A;  77 seeds   SPACE OAR INSTILLATION N/A 07/17/2018   Procedure: SPACE OAR INSTILLATION;  Surgeon: Matilda Senior, MD;  Location: [facility];  Service: Urology;  Laterality: N/A;   VIDEO BRONCHOSCOPY WITH RADIAL ENDOBRONCHIAL ULTRASOUND  10/09/2021   Procedure: VIDEO BRONCHOSCOPY WITH RADIAL ENDOBRONCHIAL ULTRASOUND;  Surgeon: Shelah Lamar RAMAN, MD;  Location: [facility];  Service: Pulmonary;;   WRIST SURGERY  10/04/2012   Patient Active  Problem List   Diagnosis Date Noted   Closed fracture of right hip requiring operative repair with nonunion, subsequent encounter 02/26/2024   Cognitive changes 01/31/2024   Displaced intertrochanteric fracture of right femur, subsequent encounter for closed fracture with nonunion 04/03/2023   Subtrochanteric fracture of right femur, closed, with nonunion, subsequent encounter 03/21/2023   Abnormal liver CT 01/07/2023   Primary adenocarcinoma of upper lobe of right lung (HCC) 10/18/2021   Encounter for antineoplastic chemotherapy 10/18/2021   Encounter for antineoplastic immunotherapy 10/18/2021   Right upper lobe pulmonary nodule 09/27/2021   Squamous cell carcinoma of scalp 09/13/2021   Tobacco abuse 04/08/2018   Malignant neoplasm of prostate (HCC) 03/25/2018   Malignant neoplasm of prostate metastatic to bone (HCC) 03/25/2018   Coronary artery disease 10/27/2013   Hyperlipidemia 10/27/2013     REFERRING PROVIDER: Edna Toribio LABOR, MD  REFERRING DIAG: [phone] (ICD-10-CM) - Status post surgery S/P right proximal femoral replacement for pathologic intertrochanteric nonunion (DOS 04.30)  THERAPY DIAG:  Pain in right hip  Muscle weakness (generalized)  Difficulty in walking, not elsewhere classified  Stiffness of right hip, not elsewhere classified  Rationale for Evaluation and Treatment: Rehabilitation  ONSET DATE: DOS  02/26/2024  SUBJECTIVE:   SUBJECTIVE STATEMENT: Pt has stage IV lung CA with metastasis to right femur/liver originally diagnosed Dec 2022.  Pt has received 2 prior hip surgeries which ended up being nonunions and having failure of the nails.  Pt underwent right proximal femoral replacement (hemiarthroplasty) for pathologic intertrochanteric nonunion on 02/26/24.  Op note indicated R subtrochanteric fx nonunion of pathologic fx from suspected lung metastasis.  Op note also indicated pt had 6 weeks of posterior hip precautions.  Pt saw MD on 7/22 and had x  rays.  Pt states the x rays looked good.  Pt thought MD stated for him not to push hip abduction too far.    Pt had HHPT for approx 2-3x/wk for approx 1 mongth.  He received a HEP, but doesn't perform his home exercises as much.  He states he's babying it  He feels his leg is tightening back up.  Pt is worried it may break again.      Pt states he can't lift his leg well.  Pt has much difficulty with performing sit to stands and has to use his UE's.  Pt has difficulty with walking and is limited with ambulation distance. He is able to perform stairs at home with rail well.  Pt has difficulty with car transfers.  He has walked in his home without his cane. Pt is driving.  PERTINENT HISTORY: -Right proximal femoral replacement (hemiarthroplasty) for pathologic intertrochanteric nonunion on 02/26/24  -stage IV non-small cell lung cancer favoring adenocarcinoma presented with right lung apical nodule, in addition to metastatic disease in the right hepatic lobe and the proximal right femoral diaphysis diagnosed and December 2022.  -Pt had CT scans  in June.  Pt states, It's still there, but not spreading.  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  He was initially WBAT.  FALLS:  Has patient fallen in last 6 months? No  LIVING ENVIRONMENT: Lives with: lives with their spouse Lives in: 1 story home Stairs: 4 or 3 steps with rail to enter home Has following equipment at home: multiple walkers, rollator, multiple canes, W/C  OCCUPATION: Part time repossessor   PLOF: Independent  PATIENT GOALS: to improve walking and  mobility   OBJECTIVE:  Note: Objective measures were completed at Evaluation unless otherwise noted.  DIAGNOSTIC FINDINGS: Pt is post-op  PATIENT SURVEYS:  LEFS:  32/80  COGNITION: Overall cognitive status: Within functional limits for tasks assessed      OBSERVATION:  Pt has incisions in lateral hip to proximal lateral thigh and distal and medial R lateral thigh. Incisions are closed and dry.  No signs of infection.   LOWER EXTREMITY ROM:   ROM Right eval Left eval  Hip flexion Pt unable to lift foot from table with knee bent to perform actively.  PROM:  68 deg   Hip extension    Hip abduction    Hip adduction    Hip internal rotation    Hip external rotation    Knee flexion 120 AROM   Knee extension    Ankle dorsiflexion    Ankle plantarflexion    Ankle inversion    Ankle eversion     (Blank rows = not tested)  LOWER EXTREMITY MMT:  MMT Right eval Left eval  Hip flexion    Hip extension    Hip abduction    Hip adduction    Hip internal rotation    Hip external rotation    Knee flexion  5/5  Knee extension Tol minimal resistance with minimal pain 5/5   Ankle dorsiflexion 5/5   Ankle plantarflexion WFL seated   Ankle inversion    Ankle eversion     (Blank rows = not tested)    FUNCTIONAL TESTS:  Pt performed a sit to stand without UE support though had significantly increased Wb'ing through L LE leaning on his L.  5x STS test:  24.51 sec with bilat Ue's. TUG:  14.92 with SPC  GAIT: Comments: Pt ambulates with hurrycane on L.  Pt leans on L having decreased WB'ing on R LE.  He has slow gait speed initially though increased speed with increasing distance.  Pt has a heel to toe gait.  Pt lifted his cane and ambulated without using it.  He had a signifcant limp with trendelenburg gait.  TREATMENT:    PT reviewed his HHPT  exercises and HEP.  Pt states he was performing mini squats, heel raises, theraband resisted knee extension and flexion, and tandem gait. He also performed stairs. Pt states he felt better after treatment.   PATIENT EDUCATION:  Education details: rationale of interventions, dx, POC, relevant anatomy, and objective findings. Person educated: Patient Education method: Explanation Education comprehension: verbalized understanding and needs further education  HOME EXERCISE PROGRAM: Pt has a HEP from HHPT  ASSESSMENT:  CLINICAL IMPRESSION: Patient is a 63 y.o. male 13 weeks s/p right proximal femoral replacement (hemiarthroplasty) for pathologic intertrochanteric nonunion presenting to the clinic with R hip pain, muscle weakness in R LE, difficulty in walking, and limited hip ROM.  Pt has stage IV lung CA with metastisis to liver and R femur and has received 2 prior hip surgeries which ended up nonunions and nail failures.  Pt received a HEP from HHPT.  He was doing those exercises, but not much now.  Pt states he can't lift his leg well.  Pt has difficulty with functional mobility skills including transfers and ambulation.  He is limited with walking distance and has a significant limp if ambulating without AD.  Pt should benefit from skilled PT to address impairments and improve overall function.      OBJECTIVE IMPAIRMENTS: Abnormal gait, decreased activity tolerance, decreased endurance, decreased mobility, difficulty walking, decreased ROM, decreased strength, impaired flexibility, and pain.   ACTIVITY LIMITATIONS: standing, squatting, transfers, dressing, and locomotion level  PARTICIPATION LIMITATIONS: shopping and community activity  PERSONAL FACTORS: Past/current experiences and 3+ comorbidities: Stage IV lung CA with metastasis to liver and R femur, COPD, arthritis,  are also affecting patient's functional outcome.   REHAB POTENTIAL: Good  CLINICAL DECISION MAKING:  Stable/uncomplicated  EVALUATION COMPLEXITY: Low   GOALS:   SHORT TERM GOALS: Target date: 06/24/2024  Pt will be able to lift R LE in hooklying in order for improved hip strength, hip mobility, and performance of transfers.  Baseline: Goal status: INITIAL Target date:  06/17/2024  2.  Pt will demo R hip flexion PROM to 90 deg for improved hip mobility and stiffness.  Baseline:  Goal status: INITIAL  3.  Pt will report at least a 50% improvement in performing sit/stand transfers. Baseline:  Goal status: INITIAL  4.  Pt will demo improved quality of gait including increased Wb'ing through R LE.   Baseline:  Goal status: INITIAL   LONG TERM GOALS: Target date: 07/22/2024   Pt will complete the 5x STS test without using UE's and improve time by at least 5 seconds for improved functional LE strength and performance of daily transfers.  Baseline:  Goal status: INITIAL  2.  Pt will report he is able to perform car transfers without difficulty.  Baseline:  Goal status: INITIAL  3.  Pt will report he is ambulating extended community distance without significant difficulty and pain.  Baseline:  Goal status: INITIAL  4.  Pt will ambulate without a trendelenburg gait and without limping.  Baseline:  Goal status: INITIAL  5.  Pt will demo improved strength to at least 4+/5 MMT in R knee extension and flexion (seated) for improved performance of and tolerance with functional mobility.  Baseline:  Goal status: INITIAL     PLAN:  PT FREQUENCY: 1-2x/wk.  Pt wants to start out 1x/wk.   PT DURATION: 8 weeks  PLANNED INTERVENTIONS: 97164- PT Re-evaluation, 97750- Physical Performance Testing, 97110-Therapeutic exercises, 97530- Therapeutic activity, [id number]-  Neuromuscular re-education, [phone]- Self Care, [id number]- Gait training, [phone]- Aquatic Therapy, Patient/Family education, Balance training, Stair training, Taping, DME instructions, and Cryotherapy  PLAN FOR NEXT SESSION: Will  call MD to clarify about any restrictions.  Cont with ther-ex, proprio, gait, and functional mobility.  Review HEP and update as needed.   Leigh Minerva III PT, DPT 05/28/24 11:04 PM

## 2024-05-28 ENCOUNTER — Other Ambulatory Visit: Payer: Self-pay

## 2024-06-03 ENCOUNTER — Ambulatory Visit (HOSPITAL_BASED_OUTPATIENT_CLINIC_OR_DEPARTMENT_OTHER): Admitting: Physical Therapy

## 2024-06-03 ENCOUNTER — Encounter (HOSPITAL_BASED_OUTPATIENT_CLINIC_OR_DEPARTMENT_OTHER): Payer: Self-pay

## 2024-06-08 NOTE — Therapy (Signed)
 OUTPATIENT PHYSICAL THERAPY LOWER EXTREMITY TREATMENT   Patient Name: Chris [name] MRN: [id number] DOB:02/03/1961, 63 y.o., male Today's Date: 06/09/2024  END OF SESSION:  PT End of Session - 06/09/24 0841     Visit Number 2    Number of Visits 14    Date for PT Re-Evaluation 07/22/24    Authorization Type MCR A&B    PT Start Time 0805    PT Stop Time 0853    PT Time Calculation (min) 48 min    Activity Tolerance Patient tolerated treatment well    Behavior During Therapy [facility] for tasks assessed/performed           Past Medical History:  Diagnosis Date   Anxiety    associated with medical care,  worsened by difficulty hearing, does better with wife present   Arthritis    Cancer associated pain    Cancer, metastatic to liver (HCC)    Complication of anesthesia    wife states very anxious may need pre sedation   COPD (chronic obstructive pulmonary disease) (HCC)    Coronary artery disease    Dyspnea    GERD (gastroesophageal reflux disease)    Hearing loss    no hearing aids per wife   Hyperlipidemia    Hypertension    MI (myocardial infarction) (HCC) 04/17/2006   Acute inferolateral wall MI with cardiogenic shock and complete heart block   Primary adenocarcinoma of upper lobe of right lung (HCC)    Prostate cancer (HCC)    Sleep apnea    Tobacco abuse    Wears glasses    Wears partial dentures    Upper   Past Surgical History:  Procedure Laterality Date   BRONCHIAL BIOPSY  10/09/2021   Procedure: BRONCHIAL BIOPSIES;  Surgeon: Shelah Lamar RAMAN, MD;  Location: [facility];  Service: Pulmonary;;   BRONCHIAL BRUSHINGS  10/09/2021   Procedure: BRONCHIAL BRUSHINGS;  Surgeon: Shelah Lamar RAMAN, MD;  Location: [facility] ENDOSCOPY;  Service: Pulmonary;;   BRONCHIAL NEEDLE ASPIRATION BIOPSY  10/09/2021   Procedure: BRONCHIAL NEEDLE ASPIRATION BIOPSIES;  Surgeon: Shelah Lamar RAMAN, MD;  Location: [facility];  Service: Pulmonary;;   CARDIAC CATHETERIZATION  2007   left, RCA  100% occluded ruptured plaque with thrombus in the proximal segment   CONVERSION TO TOTAL HIP Right 02/26/2024   Procedure: RESECTION PROXIMAL BONE TUMOR FEMUR, HEMIARTHROPLASTY, LOCAL TISSUE ADVANCEMENT;  Surgeon: Edna Toribio LABOR, MD;  Location: [facility];  Service: Orthopedics;  Laterality: Right;   CYST EXCISION N/A 08/30/2021   Procedure: EXCISION OF POSTERIOR SCALP CYST;  Surgeon: Signe Mitzie LABOR, MD;  Location: [facility];  Service: General;  Laterality: N/A;   CYSTOSCOPY N/A 07/17/2018   Procedure: PHYLLIS;  Surgeon: Matilda Senior, MD;  Location: [facility];  Service: Urology;  Laterality: N/A;  no seeds in bladder per Dr Matilda   FEMUR IM NAIL Right 04/03/2023   Procedure: REVISION FIXATION OF RIGHT FEMUR NONUNION;  Surgeon: Kendal Franky SQUIBB, MD;  Location: [facility];  Service: Orthopedics;  Laterality: Right;   FIDUCIAL MARKER PLACEMENT  10/09/2021   Procedure: FIDUCIAL MARKER PLACEMENT;  Surgeon: Shelah Lamar RAMAN, MD;  Location: [facility] ENDOSCOPY;  Service: Pulmonary;;   HAND SURGERY Right    has metal plate in arm   HARDWARE REMOVAL Right 04/03/2023   Procedure: REMOVAL OF PREVIOUS HARDWARE FEMUR;  Surgeon: Kendal Franky SQUIBB, MD;  Location: [facility];  Service: Orthopedics;  Laterality: Right;   PROSTATE BIOPSY     RADIOACTIVE SEED  IMPLANT N/A 07/17/2018   Procedure: RADIOACTIVE SEED IMPLANT/BRACHYTHERAPY IMPLANT;  Surgeon: Matilda Senior, MD;  Location: [facility];  Service: Urology;  Laterality: N/A;  77 seeds   SPACE OAR INSTILLATION N/A 07/17/2018   Procedure: SPACE OAR INSTILLATION;  Surgeon: Matilda Senior, MD;  Location: [facility];  Service: Urology;  Laterality: N/A;   VIDEO BRONCHOSCOPY WITH RADIAL ENDOBRONCHIAL ULTRASOUND  10/09/2021   Procedure: VIDEO BRONCHOSCOPY WITH RADIAL ENDOBRONCHIAL ULTRASOUND;  Surgeon: Shelah Lamar RAMAN, MD;  [facility]: [facility];  Service: Pulmonary;;   WRIST SURGERY  10/04/2012   Patient Active  Problem List   Diagnosis Date Noted   Closed fracture of right hip requiring operative repair with nonunion, subsequent encounter 02/26/2024   Cognitive changes 01/31/2024   Displaced intertrochanteric fracture of right femur, subsequent encounter for closed fracture with nonunion 04/03/2023   Subtrochanteric fracture of right femur, closed, with nonunion, subsequent encounter 03/21/2023   Abnormal liver CT 01/07/2023   Primary adenocarcinoma of upper lobe of right lung (HCC) 10/18/2021   Encounter for antineoplastic chemotherapy 10/18/2021   Encounter for antineoplastic immunotherapy 10/18/2021   Right upper lobe pulmonary nodule 09/27/2021   Squamous cell carcinoma of scalp 09/13/2021   Tobacco abuse 04/08/2018   Malignant neoplasm of prostate (HCC) 03/25/2018   Malignant neoplasm of prostate metastatic to bone (HCC) 03/25/2018   Coronary artery disease 10/27/2013   Hyperlipidemia 10/27/2013     REFERRING PROVIDER: Edna Toribio LABOR, MD  REFERRING DIAG: [phone] (ICD-10-CM) - Status post surgery S/P right proximal femoral replacement for pathologic intertrochanteric nonunion (DOS 04.30)  THERAPY DIAG:  Pain in right hip  Muscle weakness (generalized)  Difficulty in walking, not elsewhere classified  Stiffness of right hip, not elsewhere classified  Rationale for Evaluation and Treatment: Rehabilitation  ONSET DATE: DOS  02/26/2024  SUBJECTIVE:   SUBJECTIVE STATEMENT: Pt is 14 weeks and 6 days s/p right proximal femoral replacement (hemiarthroplasty) for pathologic intertrochanteric nonunion.  Pt states the last time he had X rays when he saw MD, they said it looked good and it's healing. Pt denies any adverse effects after prior Rx.   Pt states he feels scared to walk on it, scared it's going to break again.  He states he is able to things now that he couldn't do prior.  Pt wants to be able to walk without cane.  Pt reports his knee was hurting walking up to the clinic  today.  He reports 6/10 pain in R LE with walking and no pain seated.  Pt states he didn't perform LAQ with theraband with HHPT after prior surgery though did perform against manual resistance from PT.   Pt states he can't lift his leg well.  Pt has much difficulty with performing sit to stands and has to use his UE's.  Pt has difficulty with walking and is limited with ambulation distance.  Pt has difficulty with car transfers.   PERTINENT HISTORY: -Right proximal femoral replacement (hemiarthroplasty) for pathologic intertrochanteric nonunion on 02/26/24  -stage IV non-small cell lung cancer favoring adenocarcinoma presented with right lung apical nodule, in addition to metastatic disease in the right hepatic lobe and the proximal right femoral diaphysis diagnosed and December 2022.  -Pt had CT scans in June.  Pt states, It's still there, but not spreading.  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  6/10 pain with ambulating this AM R thigh and R post hip  PRECAUTIONS: Other: per R hip surgery, metastatic CA     WEIGHT BEARING RESTRICTIONS: none indicated now on PT order.  He was initially WBAT.  FALLS:  Has patient fallen in last 6 months? No  LIVING ENVIRONMENT: Lives with: lives with their spouse Lives in: 1 story home Stairs: 4 or 3 steps with rail to enter home Has following equipment at home: multiple walkers, rollator, multiple canes, W/C  OCCUPATION: Part time repossessor   PLOF: Independent  PATIENT GOALS: to improve walking and mobility   OBJECTIVE:  Note: Objective measures were completed at Evaluation unless otherwise noted.  DIAGNOSTIC FINDINGS: Pt is post-op                                                                                                                                 TREATMENT:    Reviewed current function, response to prior Rx, and pain levels.  Pt received R hip flexion PROM w/n pt and tissue tolerance.  Supine heel slides 2x10 Supine bridge 2x10 LAQ 3x10 Standing  low level marching with UE support x 10 reps Heel raises 2x10 with UE support Sit to stands from elevated table x 10 reps  Standing with NBOS on floor and airex x 30 seconds each  PT gave pt a HEP handout.  PT educated pt in correct form and appropriate frequency.  PT instructed pt he should not have pain with HEP.   See below for pt education.   PATIENT EDUCATION:  Education details:  PT answered pt's questions.  HEP, rationale of interventions, dx, POC, relevant anatomy, and exercise form. Person educated: Patient  Education method: Explanation, demonstration, verbal cues, tactile cues, handout Education comprehension: verbalized understanding and needs further education, verbal and tactile cues required, returned demonstration  HOME EXERCISE PROGRAM: Access Code: [email or web address] URL: [email or web address] Date: 06/09/2024 Prepared by: Mose Minerva  Exercises - Supine Heel Slide  - 1-2 x daily - 7 x weekly - 2 sets - 10 reps - Seated Long Arc Quad  - 1 x daily - 7 x weekly - 2 sets - 10 reps - Supine Bridge  - 1 x daily - 6-7 x weekly - 2 sets - 10 reps - Heel Raises with Counter Support  - 1 x daily - 7 x weekly - 2 sets - 10 reps  ASSESSMENT:  CLINICAL IMPRESSION: Pt ambulates with a cane though does some walking in his home without cane.  He would walk some in the clinic while holding his cane.  He reports having pain ambulating the parking lot with cane.  PT performed hip flexion PROM w/n pt and tissue tolerance and he tolerated it well.  Pt performed exercises well with cuing and instruction in correct form and positioning.  Pt tolerated exercises well.  He was able to  perform sit to stands from elevated  table.  PT established HEP and gave pt a HEP handout.  Pt demonstrates good understanding.  Pt has been performing some of his prior HH HEP.  He tolerated exercises well stating he feels good after Rx and had no increased pain.  Pt should benefit from skilled PT to address impairments and improve overall function.      OBJECTIVE IMPAIRMENTS: Abnormal gait, decreased activity tolerance, decreased endurance, decreased mobility, difficulty walking, decreased ROM, decreased strength, impaired flexibility, and pain.   ACTIVITY LIMITATIONS: standing, squatting, transfers, dressing, and locomotion level  PARTICIPATION LIMITATIONS: shopping and community activity  PERSONAL FACTORS: Past/current experiences and 3+ comorbidities: Stage IV lung CA with metastasis to liver and R femur, COPD, arthritis,  are also affecting patient's functional outcome.   REHAB POTENTIAL: Good  CLINICAL DECISION MAKING: Stable/uncomplicated  EVALUATION COMPLEXITY: Low   GOALS:   SHORT TERM GOALS: Target date: 06/24/2024  Pt will be able to lift R LE in hooklying in order for improved hip strength, hip mobility, and performance of transfers.  Baseline: Goal status: INITIAL Target date:  06/17/2024  2.  Pt will demo R hip flexion PROM to 90 deg for improved hip mobility and stiffness.  Baseline:  Goal status: INITIAL  3.  Pt will report at least a 50% improvement in performing sit/stand transfers. Baseline:  Goal status: INITIAL  4.  Pt will demo improved quality of gait including increased Wb'ing through R LE.   Baseline:  Goal status: INITIAL   LONG TERM GOALS: Target date: 07/22/2024   Pt will complete the 5x STS test without using UE's and improve time by at least 5 seconds for improved functional LE strength and performance of daily transfers.  Baseline:  Goal status: INITIAL  2.  Pt will report he is able to perform car transfers without difficulty.  Baseline:  Goal status: INITIAL  3.  Pt  will report he is ambulating extended community distance without significant difficulty and pain.  Baseline:  Goal status: INITIAL  4.  Pt will ambulate without a trendelenburg gait and without limping.  Baseline:  Goal status: INITIAL  5.  Pt will demo improved strength to at least 4+/5 MMT in R knee extension and flexion (seated) for improved performance of and tolerance with functional mobility.  Baseline:  Goal status: INITIAL     PLAN:  PT FREQUENCY: 1-2x/wk.  Pt wants to start out 1x/wk.   PT DURATION: 8 weeks  PLANNED INTERVENTIONS: 97164- PT Re-evaluation, 97750- Physical Performance Testing, 97110-Therapeutic exercises, 97530- Therapeutic activity, [id number]- Neuromuscular re-education, [id number]- Self Care, [id number]- Gait training, [id number]- Aquatic Therapy, Patient/Family education, Balance training, Stair training, Taping, DME instructions, and Cryotherapy  PLAN FOR NEXT SESSION: Will call MD to clarify about any restrictions.  Cont with ther-ex, proprio, gait, and functional mobility.  Review HEP and update as needed.   Leigh Minerva III PT, DPT 06/09/24 10:56 AM

## 2024-06-09 ENCOUNTER — Ambulatory Visit (HOSPITAL_BASED_OUTPATIENT_CLINIC_OR_DEPARTMENT_OTHER): Attending: Orthopedic Surgery | Admitting: Physical Therapy

## 2024-06-09 ENCOUNTER — Encounter (HOSPITAL_BASED_OUTPATIENT_CLINIC_OR_DEPARTMENT_OTHER): Payer: Self-pay | Admitting: Physical Therapy

## 2024-06-09 DIAGNOSIS — M6281 Muscle weakness (generalized): Secondary | ICD-10-CM | POA: Diagnosis present

## 2024-06-09 DIAGNOSIS — M25651 Stiffness of right hip, not elsewhere classified: Secondary | ICD-10-CM | POA: Diagnosis present

## 2024-06-09 DIAGNOSIS — M25551 Pain in right hip: Secondary | ICD-10-CM | POA: Diagnosis present

## 2024-06-09 DIAGNOSIS — R262 Difficulty in walking, not elsewhere classified: Secondary | ICD-10-CM | POA: Insufficient documentation

## 2024-06-12 ENCOUNTER — Encounter: Payer: Self-pay | Admitting: Cardiology

## 2024-06-12 ENCOUNTER — Ambulatory Visit (INDEPENDENT_AMBULATORY_CARE_PROVIDER_SITE_OTHER): Payer: Self-pay | Admitting: Cardiology

## 2024-06-12 ENCOUNTER — Encounter: Payer: Self-pay | Admitting: Internal Medicine

## 2024-06-12 VITALS — BP 120/70 | HR 74 | Ht 71.0 in | Wt 239.4 lb

## 2024-06-12 DIAGNOSIS — Z7689 Persons encountering health services in other specified circumstances: Secondary | ICD-10-CM | POA: Diagnosis not present

## 2024-06-12 DIAGNOSIS — E782 Mixed hyperlipidemia: Secondary | ICD-10-CM | POA: Diagnosis not present

## 2024-06-12 DIAGNOSIS — Z0130 Encounter for examination of blood pressure without abnormal findings: Secondary | ICD-10-CM

## 2024-06-12 MED ORDER — TAMSULOSIN HCL 0.4 MG PO CAPS: 0.4000 mg | ORAL_CAPSULE | Freq: Every day | ORAL | 3 refills | Status: AC

## 2024-06-12 NOTE — Progress Notes (Signed)
 New Patient Office Visit  Subjective   Patient ID: Chris [name], male    DOB: Mar 02, 1961  Age: 63 y.o. MRN: [id number]  CC:  Chief Complaint  Patient presents with   Establish Care    NPE    HPI SAXON [name] presents to establish care Previous Primary Care provider/office:   he does not have additional concerns to discuss today.   Patient in office to establish care. Patient has no acute complaints today. Following with oncology for stage IV non-small cell lung cancer favoring adenocarcinoma that presented with right lung apical nodule in addition to metastatic disease in the right hepatic lobe and proximal right femoral diaphysis diagnosed in December 2022.  Patient underwent chemotherapy, radiation therapy and immunotherapy.   Underwent surgery 5/205 for closed fracture of right hip requiring operative repair with nonunion. Walking with a cane now.  Follows with cardiology for CAD. Needs a refill on his Flomax .  Continue same medications.     Outpatient Encounter Medications as of 06/12/2024  Medication Sig   atorvastatin  (LIPITOR) 40 MG tablet Take 1 tablet (40 mg total) by mouth daily.   clopidogrel  (PLAVIX ) 75 MG tablet Take 1 tablet (75 mg total) by mouth daily.   metoprolol  tartrate (LOPRESSOR ) 25 MG tablet Take 1 tablet (25 mg total) by mouth daily.   oxyCODONE  ER (XTAMPZA  ER) 9 MG C12A Take 9 mg by mouth in the morning and at bedtime.   Oxycodone  HCl 10 MG TABS Take 0.5-1 tablets (5-10 mg total) by mouth every 4 (four) hours as needed.   Vitamin D , Ergocalciferol , (DRISDOL ) 1.25 MG (50000 UNIT) CAPS capsule TAKE 1 CAPSULE BY MOUTH ONE TIME PER WEEK   [DISCONTINUED] tamsulosin  (FLOMAX ) 0.4 MG CAPS capsule Take 1 capsule (0.4 mg total) by mouth daily.   tamsulosin  (FLOMAX ) 0.4 MG CAPS capsule Take 1 capsule (0.4 mg total) by mouth daily.   [DISCONTINUED] albuterol  (PROVENTIL ) (2.5 MG/3ML) 0.083% nebulizer solution Take 3 mLs (2.5 mg total) by nebulization every 6  (six) hours as needed for wheezing or shortness of breath. (Patient not taking: Reported on 06/12/2024)   [DISCONTINUED] ALPRAZolam  (XANAX ) 0.25 MG tablet Take 1 tablet (0.25 mg total) by mouth at bedtime. (Patient not taking: Reported on 06/12/2024)   [DISCONTINUED] Budeson-Glycopyrrol-Formoterol  (BREZTRI  AEROSPHERE) 160-9-4.8 MCG/ACT AERO Inhale 2 puffs into the lungs in the morning and at bedtime. (Patient not taking: Reported on 06/12/2024)   [DISCONTINUED] fluticasone  (FLONASE ) 50 MCG/ACT nasal spray Place 1 spray into both nostrils daily as needed for allergies. (Patient not taking: Reported on 06/12/2024)   [DISCONTINUED] ketoconazole (NIZORAL) 2 % cream Apply 1 application  topically 2 (two) times daily as needed for irritation. (Patient not taking: Reported on 06/12/2024)   [DISCONTINUED] Nebulizers (COMPRESSOR/NEBULIZER) MISC Use as directed for nebulizer medication. (Patient not taking: Reported on 06/12/2024)   [DISCONTINUED] pantoprazole  (PROTONIX ) 40 MG tablet Take 1 tablet (40 mg total) by mouth daily. (Patient not taking: Reported on 06/12/2024)   [DISCONTINUED] polyethylene glycol (MIRALAX ) 17 g packet Take 17 g by mouth daily. (Patient not taking: Reported on 06/12/2024)   [DISCONTINUED] sennosides-docusate sodium  (SENOKOT-S) 8.6-50 MG tablet Take 1 tablet by mouth daily. (Patient not taking: Reported on 06/12/2024)   [DISCONTINUED] sertraline  (ZOLOFT ) 100 MG tablet TAKE 1 TABLET BY MOUTH EVERY DAY (Patient not taking: Reported on 06/12/2024)   No facility-administered encounter medications on file as of 06/12/2024.    Past Medical History:  Diagnosis Date   Anxiety    associated with medical care,  worsened by difficulty hearing, does better with wife present   Arthritis    Cancer associated pain    Cancer, metastatic to liver [facility])    Complication of anesthesia    wife states very anxious may need pre sedation   COPD (chronic obstructive pulmonary disease) (HCC)    Coronary artery  disease    Dyspnea    GERD (gastroesophageal reflux disease)    Hearing loss    no hearing aids per wife   Hyperlipidemia    Hypertension    MI (myocardial infarction) (HCC) 04/17/2006   Acute inferolateral wall MI with cardiogenic shock and complete heart block   Primary adenocarcinoma of upper lobe of right lung (HCC)    Prostate cancer (HCC)    Sleep apnea    Tobacco abuse    Wears glasses    Wears partial dentures    Upper    Past Surgical History:  Procedure Laterality Date   BRONCHIAL BIOPSY  10/09/2021   Procedure: BRONCHIAL BIOPSIES;  Surgeon: Shelah Lamar RAMAN, MD;  Location: [facility];  Service: Pulmonary;;   BRONCHIAL BRUSHINGS  10/09/2021   Procedure: BRONCHIAL BRUSHINGS;  Surgeon: Shelah Lamar RAMAN, MD;  Location: [facility];  Service: Pulmonary;;   BRONCHIAL NEEDLE ASPIRATION BIOPSY  10/09/2021   Procedure: BRONCHIAL NEEDLE ASPIRATION BIOPSIES;  Surgeon: Shelah Lamar RAMAN, MD;  Location: [facility];  Service: Pulmonary;;   CARDIAC CATHETERIZATION  2007   left, RCA 100% occluded ruptured plaque with thrombus in the proximal segment   CONVERSION TO TOTAL HIP Right 02/26/2024   Procedure: RESECTION PROXIMAL BONE TUMOR FEMUR, HEMIARTHROPLASTY, LOCAL TISSUE ADVANCEMENT;  Surgeon: Edna Toribio LABOR, MD;  Location: [facility];  Service: Orthopedics;  Laterality: Right;   CYST EXCISION N/A 08/30/2021   Procedure: EXCISION OF POSTERIOR SCALP CYST;  Surgeon: Signe Mitzie LABOR, MD;  Location: [facility];  Service: General;  Laterality: N/A;   CYSTOSCOPY N/A 07/17/2018   Procedure: PHYLLIS;  Surgeon: Matilda Senior, MD;  Location: [facility];  Service: Urology;  Laterality: N/A;  no seeds in bladder per Dr Matilda   FEMUR IM NAIL Right 04/03/2023   Procedure: REVISION FIXATION OF RIGHT FEMUR NONUNION;  Surgeon: Kendal Franky SQUIBB, MD;  Location: [facility];  Service: Orthopedics;  Laterality: Right;   FIDUCIAL MARKER PLACEMENT  10/09/2021   Procedure: FIDUCIAL MARKER  PLACEMENT;  Surgeon: Shelah Lamar RAMAN, MD;  Location: [facility] ENDOSCOPY;  Service: Pulmonary;;   HAND SURGERY Right    has metal plate in arm   HARDWARE REMOVAL Right 04/03/2023   Procedure: REMOVAL OF PREVIOUS HARDWARE FEMUR;  Surgeon: Kendal Franky SQUIBB, MD;  [facility]: [facility];  Service: Orthopedics;  Laterality: Right;   PROSTATE BIOPSY     RADIOACTIVE SEED IMPLANT N/A 07/17/2018   Procedure: RADIOACTIVE SEED IMPLANT/BRACHYTHERAPY IMPLANT;  Surgeon: Matilda Senior, MD;  Location: [facility];  Service: Urology;  Laterality: N/A;  77 seeds   SPACE OAR INSTILLATION N/A 07/17/2018   Procedure: SPACE OAR INSTILLATION;  Surgeon: Matilda Senior, MD;  Location: [facility];  Service: Urology;  Laterality: N/A;   VIDEO BRONCHOSCOPY WITH RADIAL ENDOBRONCHIAL ULTRASOUND  10/09/2021   Procedure: VIDEO BRONCHOSCOPY WITH RADIAL ENDOBRONCHIAL ULTRASOUND;  Surgeon: Shelah Lamar RAMAN, MD;  [facility]: [facility];  Service: Pulmonary;;   WRIST SURGERY  10/04/2012    Family History  Problem Relation Age of Onset   Breast cancer Mother    Prostate cancer Neg Hx    Kidney cancer Neg Hx  Cancer Neg Hx     Social History   Socioeconomic History   Marital status: Married    Spouse name: Not on file   Number of children: Not on file   Years of education: Not on file   Highest education level: Not on file  Occupational History   Not on file  Tobacco Use   Smoking status: Every Day    Current packs/day: 0.75    Average packs/day: 0.8 packs/day for 42.0 years (31.5 ttl pk-yrs)    Types: Cigarettes   Smokeless tobacco: Never   Tobacco comments:    Smokes 0.25 PPD - [facility] - 02/12/24     Started smoking at 63 years old    Smoked 2 PPD at his heaviest.   Vaping Use   Vaping status: Never Used  Substance and Sexual Activity   Alcohol  use: No   Drug use: No   Sexual activity: Yes    Birth control/protection: None  Other Topics Concern   Not on file  Social History  Narrative   10-04 19 Unable to ask abuse questions wife with him today.   Are you right handed or left handed? Ambidextrous prominent right   Are you currently employed ? 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 no   Who lives with you? Wife and patient   What type of home do you live in: 1 story or 2 story? 1 story       Social Drivers of Corporate investment banker Strain: Not on file  Food Insecurity: No Food Insecurity (02/26/2024)   Hunger Vital Sign    Worried About Running Out of Food in the Last Year: Never true    Ran Out of Food in the Last Year: Never true  Transportation Needs: No Transportation Needs (02/26/2024)   PRAPARE - Administrator, Civil Service (Medical): No    Lack of Transportation (Non-Medical): No  Physical Activity: Not on file  Stress: Not on file  Social Connections: Not on file  Intimate Partner Violence: Not At Risk (02/26/2024)   Humiliation, Afraid, Rape, and Kick questionnaire    Fear of Current or Ex-Partner: No    Emotionally Abused: No    Physically Abused: No    Sexually Abused: No    Review of Systems  Constitutional: Negative.   HENT: Negative.    Eyes: Negative.   Respiratory: Negative.  Negative for shortness of breath.   Cardiovascular: Negative.  Negative for chest pain.  Gastrointestinal: Negative.  Negative for abdominal pain, constipation and diarrhea.  Genitourinary: Negative.   Musculoskeletal:  Negative for joint pain and myalgias.  Skin: Negative.   Neurological: Negative.  Negative for dizziness and headaches.  Endo/Heme/Allergies: Negative.   All other systems reviewed and are negative.       Objective   BP 120/70   Pulse 74   Ht 5' 11 (1.803 m)   Wt 239 lb 6.4 oz (108.6 kg)   SpO2 96%   BMI 33.39 kg/m   Physical Exam Nursing note reviewed.  Constitutional:      Appearance: Normal appearance. He is normal weight.  HENT:     Head: Normocephalic  and atraumatic.     Nose: Nose normal.     Mouth/Throat:     Mouth: Mucous membranes are moist.     Pharynx: Oropharynx is clear.  Eyes:     Extraocular Movements: Extraocular movements intact.  Conjunctiva/sclera: Conjunctivae normal.     Pupils: Pupils are equal, round, and reactive to light.  Cardiovascular:     Rate and Rhythm: Normal rate and regular rhythm.     Pulses: Normal pulses.     Heart sounds: Normal heart sounds.  Pulmonary:     Effort: Pulmonary effort is normal.     Breath sounds: Normal breath sounds.  Abdominal:     General: Abdomen is flat. Bowel sounds are normal.     Palpations: Abdomen is soft.  Musculoskeletal:        General: Normal range of motion.     Cervical back: Normal range of motion.  Skin:    General: Skin is warm and dry.  Neurological:     General: No focal deficit present.     Mental Status: He is alert and oriented to person, place, and time.  Psychiatric:        Mood and Affect: Mood normal.        Behavior: Behavior normal.        Thought Content: Thought content normal.        Judgment: Judgment normal.       Assessment & Plan:  Continue same medications.  Problem List Items Addressed This Visit       Other   Hyperlipidemia - Primary   Encounter to establish care    Return in about 6 months (around 12/13/2024) for fasting lab work prior.   Total time spent: 25 minutes  [employer], NP  06/12/2024   This document may have been prepared by Dragon Voice Recognition software and as such may include unintentional dictation errors.

## 2024-06-15 ENCOUNTER — Ambulatory Visit (HOSPITAL_BASED_OUTPATIENT_CLINIC_OR_DEPARTMENT_OTHER): Payer: Self-pay | Admitting: Physical Therapy

## 2024-06-15 ENCOUNTER — Encounter (HOSPITAL_BASED_OUTPATIENT_CLINIC_OR_DEPARTMENT_OTHER): Payer: Self-pay | Admitting: Physical Therapy

## 2024-06-15 DIAGNOSIS — R262 Difficulty in walking, not elsewhere classified: Secondary | ICD-10-CM

## 2024-06-15 DIAGNOSIS — M6281 Muscle weakness (generalized): Secondary | ICD-10-CM

## 2024-06-15 DIAGNOSIS — M25551 Pain in right hip: Secondary | ICD-10-CM | POA: Diagnosis not present

## 2024-06-15 NOTE — Therapy (Signed)
 OUTPATIENT PHYSICAL THERAPY LOWER EXTREMITY TREATMENT   Patient Name: Chris [name] MRN: [id number] DOB:[birth year], 63 y.o., male Today's Date: 06/15/2024  END OF SESSION:  PT End of Session - 06/15/24 0853     Visit Number 3    Number of Visits 14    Date for PT Re-Evaluation 07/22/24    Authorization Type MCR A&B    PT Start Time 0845    PT Stop Time 0925    PT Time Calculation (min) 40 min    Activity Tolerance Patient tolerated treatment well    Behavior During Therapy WFL for tasks assessed/performed            Past Medical History:  Diagnosis Date   Anxiety    associated with medical care,  worsened by difficulty hearing, does better with wife present   Arthritis    Cancer associated pain    Cancer, metastatic to liver (HCC)    Complication of anesthesia    wife states very anxious may need pre sedation   COPD (chronic obstructive pulmonary disease) (HCC)    Coronary artery disease    Dyspnea    GERD (gastroesophageal reflux disease)    Hearing loss    no hearing aids per wife   Hyperlipidemia    Hypertension    MI (myocardial infarction) (HCC) 04/17/2006   Acute inferolateral wall MI with cardiogenic shock and complete heart block   Primary adenocarcinoma of upper lobe of right lung (HCC)    Prostate cancer (HCC)    Sleep apnea    Tobacco abuse    Wears glasses    Wears partial dentures    Upper   Past Surgical History:  Procedure Laterality Date   BRONCHIAL BIOPSY  10/09/2021   Procedure: BRONCHIAL BIOPSIES;  Surgeon: Shelah Lamar RAMAN, MD;  Location: [facility];  Service: Pulmonary;;   BRONCHIAL BRUSHINGS  10/09/2021   Procedure: BRONCHIAL BRUSHINGS;  Surgeon: Shelah Lamar RAMAN, MD;  Location: [facility];  Service: Pulmonary;;   BRONCHIAL NEEDLE ASPIRATION BIOPSY  10/09/2021   Procedure: BRONCHIAL NEEDLE ASPIRATION BIOPSIES;  Surgeon: Shelah Lamar RAMAN, MD;  Location: [facility];  Service: Pulmonary;;   CARDIAC CATHETERIZATION  2007   left, RCA  100% occluded ruptured plaque with thrombus in the proximal segment   CONVERSION TO TOTAL HIP Right 02/26/2024   Procedure: RESECTION PROXIMAL BONE TUMOR FEMUR, HEMIARTHROPLASTY, LOCAL TISSUE ADVANCEMENT;  Surgeon: Edna Toribio LABOR, MD;  Location: [facility];  Service: Orthopedics;  Laterality: Right;   CYST EXCISION N/A 08/30/2021   Procedure: EXCISION OF POSTERIOR SCALP CYST;  Surgeon: Signe Mitzie LABOR, MD;  Location: [facility];  Service: General;  Laterality: N/A;   CYSTOSCOPY N/A 07/17/2018   Procedure: PHYLLIS;  Surgeon: Matilda Senior, MD;  Location: [facility];  Service: Urology;  Laterality: N/A;  no seeds in bladder per Dr Matilda   FEMUR IM NAIL Right 04/03/2023   Procedure: REVISION FIXATION OF RIGHT FEMUR NONUNION;  Surgeon: Kendal Franky SQUIBB, MD;  Location: [facility];  Service: Orthopedics;  Laterality: Right;   FIDUCIAL MARKER PLACEMENT  10/09/2021   Procedure: FIDUCIAL MARKER PLACEMENT;  Surgeon: Shelah Lamar RAMAN, MD;  Location: [facility]OSCOPY;  Service: Pulmonary;;   HAND SURGERY Right    has metal plate in arm   HARDWARE REMOVAL Right 04/03/2023   Procedure: REMOVAL OF PREVIOUS HARDWARE FEMUR;  Surgeon: Kendal Franky SQUIBB, MD;  Location: [facility];  Service: Orthopedics;  Laterality: Right;   PROSTATE BIOPSY     RADIOACTIVE  SEED IMPLANT N/A 07/17/2018   Procedure: RADIOACTIVE SEED IMPLANT/BRACHYTHERAPY IMPLANT;  Surgeon: Matilda Senior, MD;  Location: [facility];  Service: Urology;  Laterality: N/A;  77 seeds   SPACE OAR INSTILLATION N/A 07/17/2018   Procedure: SPACE OAR INSTILLATION;  Surgeon: Matilda Senior, MD;  Location: [facility];  Service: Urology;  Laterality: N/A;   VIDEO BRONCHOSCOPY WITH RADIAL ENDOBRONCHIAL ULTRASOUND  10/09/2021   Procedure: VIDEO BRONCHOSCOPY WITH RADIAL ENDOBRONCHIAL ULTRASOUND;  Surgeon: Shelah Lamar RAMAN, MD;  Location: [facility];  Service: Pulmonary;;   WRIST SURGERY  10/04/2012   Patient Active  Problem List   Diagnosis Date Noted   Closed fracture of right hip requiring operative repair with nonunion, subsequent encounter 02/26/2024   Cognitive changes 01/31/2024   Displaced intertrochanteric fracture of right femur, subsequent encounter for closed fracture with nonunion 04/03/2023   Subtrochanteric fracture of right femur, closed, with nonunion, subsequent encounter 03/21/2023   Abnormal liver CT 01/07/2023   Primary adenocarcinoma of upper lobe of right lung (HCC) 10/18/2021   Encounter for antineoplastic chemotherapy 10/18/2021   Encounter to establish care 10/18/2021   Right upper lobe pulmonary nodule 09/27/2021   Squamous cell carcinoma of scalp 09/13/2021   Tobacco abuse 04/08/2018   Malignant neoplasm of prostate (HCC) 03/25/2018   Malignant neoplasm of prostate metastatic to bone (HCC) 03/25/2018   Coronary artery disease 10/27/2013   Hyperlipidemia 10/27/2013     REFERRING PROVIDER: Edna Toribio LABOR, MD  REFERRING DIAG: [phone] (ICD-10-CM) - Status post surgery S/P right proximal femoral replacement for pathologic intertrochanteric nonunion (DOS 04.30)  THERAPY DIAG:  Pain in right hip  Muscle weakness (generalized)  Difficulty in walking, not elsewhere classified  Rationale for Evaluation and Treatment: Rehabilitation  ONSET DATE: DOS  02/26/2024  SUBJECTIVE:   SUBJECTIVE STATEMENT: Pt is 15 weeks s/p right proximal femoral replacement (hemiarthroplasty) for pathologic intertrochanteric nonunion.  Pt states the last time he had X rays when he saw MD, they said it looked good and it's healing.  Pt states he still can't lift his leg well.  Pt  PERTINENT HISTORY: -Right proximal femoral replacement (hemiarthroplasty) for pathologic intertrochanteric nonunion on 02/26/24  -stage IV non-small cell lung cancer favoring adenocarcinoma presented with right lung apical nodule, in addition to metastatic disease in the right hepatic lobe and the proximal  right femoral diaphysis diagnosed and December 2022.  -Pt had CT scans in June.  Pt states, It's still there, but not spreading.  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  6/10 pain with ambulating this AM R thigh and R post hip  PRECAUTIONS: Other: per R hip surgery, metastatic CA     WEIGHT BEARING RESTRICTIONS:  MD called and reported no restrictions for pt PROM or WB   FALLS:  Has patient fallen in last 6 months? No  LIVING ENVIRONMENT: Lives with: lives with their spouse Lives in: 1 story home Stairs: 4 or 3 steps with rail to enter home Has following equipment at home: multiple walkers, rollator, multiple canes, W/C  OCCUPATION: Part time repossessor   PLOF: Independent  PATIENT GOALS: to improve walking and mobility   OBJECTIVE:  Note: Objective measures were completed at Evaluation unless otherwise noted.  DIAGNOSTIC FINDINGS: Pt is post-op  TREATMENT:    8/18  Nustep Lvl 4 8 min   Supine march 3x10 with TrA brace Supine bridge with YTB at knees 2x8 Heel toe rocking 2x10 Prone quad stretch  Daily walking program, activity tolerance, exercise progression, expected soreness and pain management  Previous:   Reviewed current function, response to prior Rx, and pain levels.  Pt received R hip flexion PROM w/n pt and tissue tolerance.  Supine heel slides 2x10 Supine bridge 2x10 LAQ 3x10 Standing  low level marching with UE support x 10 reps Heel raises 2x10 with UE support Sit to stands from elevated table x 10 reps  Standing with NBOS on floor and airex x 30 seconds each  PT gave pt a HEP handout.   PT educated pt in correct form and appropriate frequency.  PT instructed pt he should not have pain with HEP.   See below for pt education.   PATIENT EDUCATION:  Education details:  anatomy, exercise progression, DOMS expectations, HEP, POC  Person educated: Patient  Education method: Explanation, demonstration, verbal cues, tactile cues, handout Education comprehension: verbalized understanding and needs further education, verbal and tactile cues required, returned demonstration  HOME EXERCISE PROGRAM: Access Code: [email or web address] URL: [email or web address] Date: 06/09/2024 Prepared by: Mose Minerva  Exercises - Supine Heel Slide  - 1-2 x daily - 7 x weekly - 2 sets - 10 reps - Seated Long Arc Quad  - 1 x daily - 7 x weekly - 2 sets - 10 reps - Supine Bridge  - 1 x daily - 6-7 x weekly - 2 sets - 10 reps - Heel Raises with Counter Support  - 1 x daily - 7 x weekly - 2 sets - 10 reps  ASSESSMENT:  CLINICAL IMPRESSION: Pt able to progress strengthening, endurance, and standing tolerance of the R hip to work on progressive mobility. Pt without pain during session but is very weak in frontal and transverse plane as expected given post surgical status. Pt gave verbal understanding to progression of walking program as well as use of HEP to help with hip stability and funcitonal mobility. Plan to continue with progressive strength, gait, and balance as tolerated. Pt should benefit from skilled PT to address impairments and improve overall function.      OBJECTIVE IMPAIRMENTS: Abnormal gait, decreased activity tolerance, decreased endurance, decreased mobility, difficulty walking, decreased ROM, decreased strength, impaired flexibility, and pain.   ACTIVITY LIMITATIONS: standing, squatting, transfers, dressing, and locomotion level  PARTICIPATION LIMITATIONS: shopping and community activity  PERSONAL FACTORS: Past/current experiences and 3+ comorbidities: Stage IV lung CA with  metastasis to liver and R femur, COPD, arthritis,  are also affecting patient's functional outcome.   REHAB POTENTIAL: Good  CLINICAL DECISION MAKING: Stable/uncomplicated  EVALUATION COMPLEXITY: Low   GOALS:   SHORT TERM GOALS: Target date: 06/24/2024  Pt will be able to lift R LE in hooklying in order for improved hip strength, hip mobility, and performance of transfers.  Baseline: Goal status: INITIAL Target date:  06/17/2024  2.  Pt will demo R hip flexion PROM to 90 deg for improved hip mobility and stiffness.  Baseline:  Goal status: INITIAL  3.  Pt will report at least a 50% improvement in performing sit/stand transfers. Baseline:  Goal status: INITIAL  4.  Pt will demo improved quality of gait including increased Wb'ing through R LE.   Baseline:  Goal status: INITIAL   LONG TERM GOALS: Target date: 07/22/2024   Pt will complete the 5x STS test  without using UE's and improve time by at least 5 seconds for improved functional LE strength and performance of daily transfers.  Baseline:  Goal status: INITIAL  2.  Pt will report he is able to perform car transfers without difficulty.  Baseline:  Goal status: INITIAL  3.  Pt will report he is ambulating extended community distance without significant difficulty and pain.  Baseline:  Goal status: INITIAL  4.  Pt will ambulate without a trendelenburg gait and without limping.  Baseline:  Goal status: INITIAL  5.  Pt will demo improved strength to at least 4+/5 MMT in R knee extension and flexion (seated) for improved performance of and tolerance with functional mobility.  Baseline:  Goal status: INITIAL     PLAN:  PT FREQUENCY: 1-2x/wk.  Pt wants to start out 1x/wk.   PT DURATION: 8 weeks  PLANNED INTERVENTIONS: 97164- PT Re-evaluation, 97750- Physical Performance Testing, 97110-Therapeutic exercises, 97530- Therapeutic activity, [id number]- Neuromuscular re-education, [phone]- Self Care, 02883- Gait training,  [phone]- Aquatic Therapy, Patient/Family education, Balance training, Stair training, Taping, DME instructions, and Cryotherapy  PLAN FOR NEXT SESSION: Will call MD to clarify about any restrictions.  Cont with ther-ex, proprio, gait, and functional mobility.  Review HEP and update as needed.  Dale Call PT, DPT 06/15/24 9:32 AM

## 2024-06-21 ENCOUNTER — Encounter: Payer: Self-pay | Admitting: Internal Medicine

## 2024-06-22 ENCOUNTER — Telehealth: Payer: Self-pay

## 2024-06-22 NOTE — Telephone Encounter (Signed)
 Per wife really patient lethargic, pain in neck but is not taking the oxy ordered by [facility] and sleeping a lot worsening last couple of weeks . Offered [facility] called patient back and wife says now he is going to hold off and see how he feels in the morning. Will call back in am if worsening

## 2024-06-24 ENCOUNTER — Encounter (HOSPITAL_BASED_OUTPATIENT_CLINIC_OR_DEPARTMENT_OTHER): Payer: Self-pay | Admitting: Physical Therapy

## 2024-06-24 ENCOUNTER — Ambulatory Visit (HOSPITAL_BASED_OUTPATIENT_CLINIC_OR_DEPARTMENT_OTHER): Admitting: Physical Therapy

## 2024-06-24 DIAGNOSIS — M25551 Pain in right hip: Secondary | ICD-10-CM

## 2024-06-24 DIAGNOSIS — M25651 Stiffness of right hip, not elsewhere classified: Secondary | ICD-10-CM

## 2024-06-24 DIAGNOSIS — M6281 Muscle weakness (generalized): Secondary | ICD-10-CM

## 2024-06-24 DIAGNOSIS — R262 Difficulty in walking, not elsewhere classified: Secondary | ICD-10-CM

## 2024-06-24 NOTE — Therapy (Signed)
 OUTPATIENT PHYSICAL THERAPY LOWER EXTREMITY TREATMENT   Patient Name: Chris [name] MRN: [id number] DOB:1961-02-05, 63 y.o., male Today's Date: 06/25/2024  END OF SESSION:  PT End of Session - 06/24/24 0808     Visit Number 4    Number of Visits 14    Authorization Type MCR A&B    PT Start Time 0805    PT Stop Time 0848    PT Time Calculation (min) 43 min    Activity Tolerance Patient tolerated treatment well    Behavior During Therapy WFL for tasks assessed/performed            Past Medical History:  Diagnosis Date   Anxiety    associated with medical care,  worsened by difficulty hearing, does better with wife present   Arthritis    Cancer associated pain    Cancer, metastatic to liver (HCC)    Complication of anesthesia    wife states very anxious may need pre sedation   COPD (chronic obstructive pulmonary disease) (HCC)    Coronary artery disease    Dyspnea    GERD (gastroesophageal reflux disease)    Hearing loss    no hearing aids per wife   Hyperlipidemia    Hypertension    MI (myocardial infarction) (HCC) 04/17/2006   Acute inferolateral wall MI with cardiogenic shock and complete heart block   Primary adenocarcinoma of upper lobe of right lung (HCC)    Prostate cancer (HCC)    Sleep apnea    Tobacco abuse    Wears glasses    Wears partial dentures    Upper   Past Surgical History:  Procedure Laterality Date   BRONCHIAL BIOPSY  10/09/2021   Procedure: BRONCHIAL BIOPSIES;  Surgeon: Shelah Lamar RAMAN, MD;  Location: [facility];  Service: Pulmonary;;   BRONCHIAL BRUSHINGS  10/09/2021   Procedure: BRONCHIAL BRUSHINGS;  Surgeon: Shelah Lamar RAMAN, MD;  Location: [facility] ENDOSCOPY;  Service: Pulmonary;;   BRONCHIAL NEEDLE ASPIRATION BIOPSY  10/09/2021   Procedure: BRONCHIAL NEEDLE ASPIRATION BIOPSIES;  Surgeon: Shelah Lamar RAMAN, MD;  Location: [facility];  Service: Pulmonary;;   CARDIAC CATHETERIZATION  2007   left, RCA 100% occluded ruptured plaque with  thrombus in the proximal segment   CONVERSION TO TOTAL HIP Right 02/26/2024   Procedure: RESECTION PROXIMAL BONE TUMOR FEMUR, HEMIARTHROPLASTY, LOCAL TISSUE ADVANCEMENT;  Surgeon: Edna Toribio LABOR, MD;  Location: [facility];  Service: Orthopedics;  Laterality: Right;   CYST EXCISION N/A 08/30/2021   Procedure: EXCISION OF POSTERIOR SCALP CYST;  Surgeon: Signe Mitzie LABOR, MD;  Location: [facility];  Service: General;  Laterality: N/A;   CYSTOSCOPY N/A 07/17/2018   Procedure: PHYLLIS;  Surgeon: Matilda Senior, MD;  Location: [facility];  Service: Urology;  Laterality: N/A;  no seeds in bladder per Dr Matilda   FEMUR IM NAIL Right 04/03/2023   Procedure: REVISION FIXATION OF RIGHT FEMUR NONUNION;  Surgeon: Kendal Franky SQUIBB, MD;  Location: [facility];  Service: Orthopedics;  Laterality: Right;   FIDUCIAL MARKER PLACEMENT  10/09/2021   Procedure: FIDUCIAL MARKER PLACEMENT;  Surgeon: Shelah Lamar RAMAN, MD;  Location: [facility] ENDOSCOPY;  Service: Pulmonary;;   HAND SURGERY Right    has metal plate in arm   HARDWARE REMOVAL Right 04/03/2023   Procedure: REMOVAL OF PREVIOUS HARDWARE FEMUR;  Surgeon: Kendal Franky SQUIBB, MD;  Location: [facility];  Service: Orthopedics;  Laterality: Right;   PROSTATE BIOPSY     RADIOACTIVE SEED IMPLANT N/A 07/17/2018   Procedure: RADIOACTIVE  SEED IMPLANT/BRACHYTHERAPY IMPLANT;  Surgeon: Matilda Senior, MD;  Location: [facility];  Service: Urology;  Laterality: N/A;  77 seeds   SPACE OAR INSTILLATION N/A 07/17/2018   Procedure: SPACE OAR INSTILLATION;  Surgeon: Matilda Senior, MD;  Location: [facility];  Service: Urology;  Laterality: N/A;   VIDEO BRONCHOSCOPY WITH RADIAL ENDOBRONCHIAL ULTRASOUND  10/09/2021   Procedure: VIDEO BRONCHOSCOPY WITH RADIAL ENDOBRONCHIAL ULTRASOUND;  Surgeon: Shelah Lamar RAMAN, MD;  Location: [facility];  Service: Pulmonary;;   WRIST SURGERY  10/04/2012   Patient Active Problem List   Diagnosis Date Noted    Closed fracture of right hip requiring operative repair with nonunion, subsequent encounter 02/26/2024   Cognitive changes 01/31/2024   Displaced intertrochanteric fracture of right femur, subsequent encounter for closed fracture with nonunion 04/03/2023   Subtrochanteric fracture of right femur, closed, with nonunion, subsequent encounter 03/21/2023   Abnormal liver CT 01/07/2023   Primary adenocarcinoma of upper lobe of right lung (HCC) 10/18/2021   Encounter for antineoplastic chemotherapy 10/18/2021   Encounter to establish care 10/18/2021   Right upper lobe pulmonary nodule 09/27/2021   Squamous cell carcinoma of scalp 09/13/2021   Tobacco abuse 04/08/2018   Malignant neoplasm of prostate (HCC) 03/25/2018   Malignant neoplasm of prostate metastatic to bone (HCC) 03/25/2018   Coronary artery disease 10/27/2013   Hyperlipidemia 10/27/2013     REFERRING PROVIDER: Edna Toribio LABOR, MD  REFERRING DIAG: [phone] (ICD-10-CM) - Status post surgery S/P right proximal femoral replacement for pathologic intertrochanteric nonunion (DOS 04.30)  THERAPY DIAG:  Pain in right hip  Muscle weakness (generalized)  Difficulty in walking, not elsewhere classified  Stiffness of right hip, not elsewhere classified  Rationale for Evaluation and Treatment: Rehabilitation  ONSET DATE: DOS  02/26/2024  SUBJECTIVE:   SUBJECTIVE STATEMENT: Pt is 17 weeks s/p right proximal femoral replacement (hemiarthroplasty) for pathologic intertrochanteric nonunion.   Pt states he felt good after prior Rx.  Pt states his pain is not bad this AM.  Pt reports his pain gets worse in the evening.  Pt states it's getting better.  He's able to do things now that he hasn't done in 2 years.  He has pain in quad with ambulation.  Pt still can't lift leg well.   PERTINENT HISTORY: -Right proximal femoral replacement (hemiarthroplasty) for pathologic intertrochanteric nonunion on 02/26/24  -stage IV non-small  cell lung cancer favoring adenocarcinoma presented with right lung apical nodule, in addition to metastatic disease in the right hepatic lobe and the proximal right femoral diaphysis diagnosed and December 2022.  -Pt had CT scans in June.  Pt states, It's still there, but not spreading.  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 No  LIVING ENVIRONMENT: Lives with: lives with their spouse Lives in: 1 story home Stairs: 4 or 3 steps with rail to enter home Has following equipment at home: multiple walkers, rollator, multiple canes, W/C  OCCUPATION: Part time repossessor   PLOF: Independent  PATIENT GOALS: to improve walking and mobility   OBJECTIVE:  Note: Objective measures were completed at  Evaluation unless otherwise noted.  DIAGNOSTIC FINDINGS: Pt is post-op                                                                                                                                TREATMENT:    8/27 Nustep Lvl 4 UE/LE's x 6 min  Supine bridge with YTB at knees 3x10 Supine heel slides x10  Pt received R hip flexion PROM  Hooklying R hip flexion 2x10 LAQ approx 12-15 reps, YTB 2x10 Sit to stands from elevated table x 10 reps Heel raises 2x10 Standing hip abduction x10 reps Standing on airex with NBOS 2x30 sec Weight shifts f/b in staggered stance with UE support    PATIENT EDUCATION:  Education details:  anatomy, exercise progression, DOMS expectations, HEP, POC Person educated: Patient  Education method: Explanation, demonstration, verbal cues, tactile cues,  handout Education comprehension: verbalized understanding and needs further education, verbal and tactile cues required, returned demonstration  HOME EXERCISE PROGRAM: Access Code: [email or web address] URL: [email or web address] Date: 06/09/2024 Prepared by: Mose Minerva  Exercises - Supine Heel Slide  - 1-2 x daily - 7 x weekly - 2 sets - 10 reps - Seated Long Arc Quad  - 1 x daily - 7 x weekly - 2 sets - 10 reps - Supine Bridge  - 1 x daily - 6-7 x weekly - 2 sets - 10 reps - Heel Raises with Counter Support  - 1 x daily - 7 x weekly - 2 sets - 10 reps  ASSESSMENT:  CLINICAL IMPRESSION: Pt presents to treatment stating he is getting better.  Pt has weakness in R LE.  PT instructed pt in exercises to improve LE and functional strength, ROM, gait, and mobility.  He tolerated exercises well.  Pt required much instruction to perform f/b weight shifts in staggered stance correctly and demonstrated improved form with cuing and instruction.  PT had pt perform sit to stands from table though increased table height to improve performance.  He tolerated treatment well and responded well to Rx having no increased pain after Rx.  Pt should benefit from continued skilled PT to address impairments and improve overall function.       OBJECTIVE IMPAIRMENTS: Abnormal gait, decreased activity tolerance, decreased endurance, decreased mobility, difficulty walking, decreased ROM, decreased strength, impaired flexibility, and pain.   ACTIVITY LIMITATIONS: standing, squatting, transfers, dressing, and locomotion level  PARTICIPATION LIMITATIONS: shopping and community activity  PERSONAL FACTORS: Past/current experiences and 3+ comorbidities: Stage IV lung CA with metastasis to liver and R femur, COPD, arthritis,  are also affecting patient's functional outcome.   REHAB POTENTIAL: Good  CLINICAL DECISION MAKING: Stable/uncomplicated  EVALUATION COMPLEXITY: Low   GOALS:   SHORT TERM GOALS:  Target date: 06/24/2024  Pt will be able to lift R LE in hooklying in order for improved hip strength, hip mobility, and performance of transfers.  Baseline: Goal status: INITIAL Target date:  06/17/2024  2.  Pt  will demo R hip flexion PROM to 90 deg for improved hip mobility and stiffness.  Baseline:  Goal status: INITIAL  3.  Pt will report at least a 50% improvement in performing sit/stand transfers. Baseline:  Goal status: INITIAL  4.  Pt will demo improved quality of gait including increased Wb'ing through R LE.   Baseline:  Goal status: INITIAL   LONG TERM GOALS: Target date: 07/22/2024   Pt will complete the 5x STS test without using UE's and improve time by at least 5 seconds for improved functional LE strength and performance of daily transfers.  Baseline:  Goal status: INITIAL  2.  Pt will report he is able to perform car transfers without difficulty.  Baseline:  Goal status: INITIAL  3.  Pt will report he is ambulating extended community distance without significant difficulty and pain.  Baseline:  Goal status: INITIAL  4.  Pt will ambulate without a trendelenburg gait and without limping.  Baseline:  Goal status: INITIAL  5.  Pt will demo improved strength to at least 4+/5 MMT in R knee extension and flexion (seated) for improved performance of and tolerance with functional mobility.  Baseline:  Goal status: INITIAL     PLAN:  PT FREQUENCY: 1-2x/wk.  Pt wants to start out 1x/wk.   PT DURATION: 8 weeks  PLANNED INTERVENTIONS: 97164- PT Re-evaluation, 97750- Physical Performance Testing, 97110-Therapeutic exercises, 97530- Therapeutic activity, [id number]- Neuromuscular re-education, [phone]- Self Care, 02883- Gait training, [phone]- Aquatic Therapy, Patient/Family education, Balance training, Stair training, Taping, DME instructions, and Cryotherapy  PLAN FOR NEXT SESSION:   Cont with ther-ex, proprio, gait, and functional mobility.  Review HEP and update as  needed.  Leigh Minerva III PT, DPT 06/25/24 3:32 PM

## 2024-06-29 ENCOUNTER — Encounter: Payer: Self-pay | Admitting: Hospice and Palliative Medicine

## 2024-06-30 ENCOUNTER — Other Ambulatory Visit: Payer: Self-pay | Admitting: *Deleted

## 2024-06-30 MED ORDER — OXYCODONE HCL 10 MG PO TABS: 5.0000 mg | ORAL_TABLET | ORAL | 0 refills | Status: DC | PRN

## 2024-06-30 MED ORDER — XTAMPZA ER 9 MG PO C12A: 9.0000 mg | EXTENDED_RELEASE_CAPSULE | Freq: Two times a day (BID) | ORAL | 0 refills | Status: AC

## 2024-07-01 ENCOUNTER — Ambulatory Visit (HOSPITAL_BASED_OUTPATIENT_CLINIC_OR_DEPARTMENT_OTHER): Attending: Orthopedic Surgery | Admitting: Physical Therapy

## 2024-07-01 ENCOUNTER — Telehealth: Payer: Self-pay | Admitting: *Deleted

## 2024-07-01 ENCOUNTER — Encounter (HOSPITAL_BASED_OUTPATIENT_CLINIC_OR_DEPARTMENT_OTHER): Payer: Self-pay | Admitting: Physical Therapy

## 2024-07-01 ENCOUNTER — Other Ambulatory Visit: Payer: Self-pay

## 2024-07-01 ENCOUNTER — Encounter: Payer: Self-pay | Admitting: Internal Medicine

## 2024-07-01 DIAGNOSIS — M25551 Pain in right hip: Secondary | ICD-10-CM | POA: Insufficient documentation

## 2024-07-01 DIAGNOSIS — M25651 Stiffness of right hip, not elsewhere classified: Secondary | ICD-10-CM | POA: Insufficient documentation

## 2024-07-01 DIAGNOSIS — M6281 Muscle weakness (generalized): Secondary | ICD-10-CM | POA: Insufficient documentation

## 2024-07-01 DIAGNOSIS — R262 Difficulty in walking, not elsewhere classified: Secondary | ICD-10-CM | POA: Insufficient documentation

## 2024-07-01 NOTE — Therapy (Signed)
 OUTPATIENT PHYSICAL THERAPY LOWER EXTREMITY TREATMENT   Patient Name: Chris [name] MRN: [id number] DOB:03/05/1961, 63 y.o., male Today's Date: 07/02/2024  END OF SESSION:  PT End of Session - 07/01/24 0845     Visit Number 5    Number of Visits 14    Date for PT Re-Evaluation 07/22/24    Authorization Type MCR A&B    PT Start Time 0805    PT Stop Time 0850    PT Time Calculation (min) 45 min    Activity Tolerance Patient tolerated treatment well    Behavior During Therapy WFL for tasks assessed/performed             Past Medical History:  Diagnosis Date   Anxiety    associated with medical care,  worsened by difficulty hearing, does better with wife present   Arthritis    Cancer associated pain    Cancer, metastatic to liver (HCC)    Complication of anesthesia    wife states very anxious may need pre sedation   COPD (chronic obstructive pulmonary disease) (HCC)    Coronary artery disease    Dyspnea    GERD (gastroesophageal reflux disease)    Hearing loss    no hearing aids per wife   Hyperlipidemia    Hypertension    MI (myocardial infarction) (HCC) 04/17/2006   Acute inferolateral wall MI with cardiogenic shock and complete heart block   Primary adenocarcinoma of upper lobe of right lung (HCC)    Prostate cancer (HCC)    Sleep apnea    Tobacco abuse    Wears glasses    Wears partial dentures    Upper   Past Surgical History:  Procedure Laterality Date   BRONCHIAL BIOPSY  10/09/2021   Procedure: BRONCHIAL BIOPSIES;  Surgeon: Shelah Lamar RAMAN, MD;  Location: [facility];  Service: Pulmonary;;   BRONCHIAL BRUSHINGS  10/09/2021   Procedure: BRONCHIAL BRUSHINGS;  Surgeon: Shelah Lamar RAMAN, MD;  Location: [facility];  Service: Pulmonary;;   BRONCHIAL NEEDLE ASPIRATION BIOPSY  10/09/2021   Procedure: BRONCHIAL NEEDLE ASPIRATION BIOPSIES;  Surgeon: Shelah Lamar RAMAN, MD;  Location: [facility];  Service: Pulmonary;;   CARDIAC CATHETERIZATION  2007   left,  RCA 100% occluded ruptured plaque with thrombus in the proximal segment   CONVERSION TO TOTAL HIP Right 02/26/2024   Procedure: RESECTION PROXIMAL BONE TUMOR FEMUR, HEMIARTHROPLASTY, LOCAL TISSUE ADVANCEMENT;  Surgeon: Edna Toribio LABOR, MD;  Location: [facility];  Service: Orthopedics;  Laterality: Right;   CYST EXCISION N/A 08/30/2021   Procedure: EXCISION OF POSTERIOR SCALP CYST;  Surgeon: Signe Mitzie LABOR, MD;  Location: [facility];  Service: General;  Laterality: N/A;   CYSTOSCOPY N/A 07/17/2018   Procedure: PHYLLIS;  Surgeon: Matilda Senior, MD;  Location: [facility];  Service: Urology;  Laterality: N/A;  no seeds in bladder per Dr Matilda   FEMUR IM NAIL Right 04/03/2023   Procedure: REVISION FIXATION OF RIGHT FEMUR NONUNION;  Surgeon: Kendal Franky SQUIBB, MD;  Location: [facility] OR;  Service: Orthopedics;  Laterality: Right;   FIDUCIAL MARKER PLACEMENT  10/09/2021   Procedure: FIDUCIAL MARKER PLACEMENT;  Surgeon: Shelah Lamar RAMAN, MD;  Location: [facility] ENDOSCOPY;  Service: Pulmonary;;   HAND SURGERY Right    has metal plate in arm   HARDWARE REMOVAL Right 04/03/2023   Procedure: REMOVAL OF PREVIOUS HARDWARE FEMUR;  Surgeon: Kendal Franky SQUIBB, MD;  Location: [facility];  Service: Orthopedics;  Laterality: Right;   PROSTATE BIOPSY  RADIOACTIVE SEED IMPLANT N/A 07/17/2018   Procedure: RADIOACTIVE SEED IMPLANT/BRACHYTHERAPY IMPLANT;  Surgeon: Matilda Senior, MD;  Location: [facility];  Service: Urology;  Laterality: N/A;  77 seeds   SPACE OAR INSTILLATION N/A 07/17/2018   Procedure: SPACE OAR INSTILLATION;  Surgeon: Matilda Senior, MD;  Location: [facility];  Service: Urology;  Laterality: N/A;   VIDEO BRONCHOSCOPY WITH RADIAL ENDOBRONCHIAL ULTRASOUND  10/09/2021   Procedure: VIDEO BRONCHOSCOPY WITH RADIAL ENDOBRONCHIAL ULTRASOUND;  Surgeon: Shelah Lamar RAMAN, MD;  Location: [facility];  Service: Pulmonary;;   WRIST SURGERY  10/04/2012   Patient Active  Problem List   Diagnosis Date Noted   Closed fracture of right hip requiring operative repair with nonunion, subsequent encounter 02/26/2024   Cognitive changes 01/31/2024   Displaced intertrochanteric fracture of right femur, subsequent encounter for closed fracture with nonunion 04/03/2023   Subtrochanteric fracture of right femur, closed, with nonunion, subsequent encounter 03/21/2023   Abnormal liver CT 01/07/2023   Primary adenocarcinoma of upper lobe of right lung (HCC) 10/18/2021   Encounter for antineoplastic chemotherapy 10/18/2021   Encounter to establish care 10/18/2021   Right upper lobe pulmonary nodule 09/27/2021   Squamous cell carcinoma of scalp 09/13/2021   Tobacco abuse 04/08/2018   Malignant neoplasm of prostate (HCC) 03/25/2018   Malignant neoplasm of prostate metastatic to bone (HCC) 03/25/2018   Coronary artery disease 10/27/2013   Hyperlipidemia 10/27/2013     REFERRING PROVIDER: Edna Toribio LABOR, MD  REFERRING DIAG: [phone] (ICD-10-CM) - Status post surgery S/P right proximal femoral replacement for pathologic intertrochanteric nonunion (DOS 04.30)  THERAPY DIAG:  Pain in right hip  Muscle weakness (generalized)  Difficulty in walking, not elsewhere classified  Stiffness of right hip, not elsewhere classified  Rationale for Evaluation and Treatment: Rehabilitation  ONSET DATE: DOS  02/26/2024  SUBJECTIVE:   SUBJECTIVE STATEMENT: Pt is 18 weeks s/p right proximal femoral replacement (hemiarthroplasty) for pathologic intertrochanteric nonunion.   Pt states he is doing good.  He denies any adverse effects after prior Rx.  Pt is walking better and occasionally walks without his cane.  Pt states he probably did too much work over the weekend.  He did some pressure washing.  Pt states he hasn't been doing his HEP this week.  Pt still unable to lift leg well.     PERTINENT HISTORY: -Right proximal femoral replacement (hemiarthroplasty) for  pathologic intertrochanteric nonunion on 02/26/24  -stage IV non-small cell lung cancer favoring adenocarcinoma presented with right lung apical nodule, in addition to metastatic disease in the right hepatic lobe and the proximal right femoral diaphysis diagnosed and December 2022.  -Pt had CT scans in June.  Pt states, It's still there, but not spreading.  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 No  LIVING ENVIRONMENT: Lives with: lives with their spouse Lives in: 1 story home Stairs: 4 or 3 steps with rail to enter home Has following equipment at home: multiple walkers, rollator, multiple canes, W/C  OCCUPATION: Part time  repossessor   PLOF: Independent  PATIENT GOALS: to improve walking and mobility   OBJECTIVE:  Note: Objective measures were completed at Evaluation unless otherwise noted.  DIAGNOSTIC FINDINGS: Pt is post-op                                                                                                                                TREATMENT:    9/3 Reviewed current function, pain levels, HEP compliance, and response to prior treatment.    Nustep Lvl 4 UE/LE's x 6 min   Pt received R hip flexion PROM  Supine bridge with RTB at knees 3x10 Hooklying R hip flexion 2x10 LAQ YTB approx 12 reps, RTB 2x10 Sit to stands from elevated table 2 x 10 reps Heel raises 2x10 Standing hip abduction 2x10 reps R LE only   PATIENT EDUCATION:  Education details:  anatomy, exercise progression, DOMS  expectations, HEP, POC Person educated: Patient  Education method: Explanation, demonstration, verbal cues, tactile cues, handout Education comprehension: verbalized understanding and needs further education, verbal and tactile cues required, returned demonstration  HOME EXERCISE PROGRAM: Access Code: [email or web address] URL: [email or web address] Date: 06/09/2024 Prepared by: Mose Minerva  Exercises - Supine Heel Slide  - 1-2 x daily - 7 x weekly - 2 sets - 10 reps - Seated Long Arc Quad  - 1 x daily - 7 x weekly - 2 sets - 10 reps - Supine Bridge  - 1 x daily - 6-7 x weekly - 2 sets - 10 reps - Heel Raises with Counter Support  - 1 x daily - 7 x weekly - 2 sets - 10 reps  ASSESSMENT:  CLINICAL IMPRESSION: Pt is improving with function and mobility as evidenced by subjective reports.  He still has much difficulty with lifting R LE.  Pt performed exercises well with cuing and instruction in correct form and positioning.  Pt hasn't been performing HEP this week and PT encouraged compliance with HEP.  He responded well to treatment reporting no increased pain after treatment.  He should benefit from cont skilled PT to address ongoing goals and impairments and improve overall function.      OBJECTIVE IMPAIRMENTS: Abnormal gait, decreased activity tolerance, decreased endurance, decreased mobility, difficulty walking, decreased ROM, decreased strength, impaired flexibility, and pain.   ACTIVITY LIMITATIONS: standing, squatting, transfers, dressing, and locomotion level  PARTICIPATION LIMITATIONS: shopping and community activity  PERSONAL FACTORS: Past/current experiences and 3+ comorbidities: Stage IV lung CA with metastasis to liver and R femur, COPD, arthritis,  are also affecting patient's functional outcome.   REHAB POTENTIAL: Good  CLINICAL DECISION MAKING: Stable/uncomplicated  EVALUATION COMPLEXITY: Low   GOALS:   SHORT TERM GOALS: Target date: 06/24/2024  Pt will  be able to lift R LE in hooklying in order for improved hip strength, hip mobility, and performance of transfers.  Baseline: Goal status: INITIAL Target date:  06/17/2024  2.  Pt will demo R hip flexion PROM  to 90 deg for improved hip mobility and stiffness.  Baseline:  Goal status: INITIAL  3.  Pt will report at least a 50% improvement in performing sit/stand transfers. Baseline:  Goal status: INITIAL  4.  Pt will demo improved quality of gait including increased Wb'ing through R LE.   Baseline:  Goal status: INITIAL   LONG TERM GOALS: Target date: 07/22/2024   Pt will complete the 5x STS test without using UE's and improve time by at least 5 seconds for improved functional LE strength and performance of daily transfers.  Baseline:  Goal status: INITIAL  2.  Pt will report he is able to perform car transfers without difficulty.  Baseline:  Goal status: INITIAL  3.  Pt will report he is ambulating extended community distance without significant difficulty and pain.  Baseline:  Goal status: INITIAL  4.  Pt will ambulate without a trendelenburg gait and without limping.  Baseline:  Goal status: INITIAL  5.  Pt will demo improved strength to at least 4+/5 MMT in R knee extension and flexion (seated) for improved performance of and tolerance with functional mobility.  Baseline:  Goal status: INITIAL     PLAN:  PT FREQUENCY: 1-2x/wk.  Pt wants to start out 1x/wk.   PT DURATION: 8 weeks  PLANNED INTERVENTIONS: 97164- PT Re-evaluation, 97750- Physical Performance Testing, 97110-Therapeutic exercises, 97530- Therapeutic activity, [id number]- Neuromuscular re-education, [phone]- Self Care, [id number]- Gait training, [phone]- Aquatic Therapy, Patient/Family education, Balance training, Stair training, Taping, DME instructions, and Cryotherapy  PLAN FOR NEXT SESSION:   Cont with ther-ex, proprio, gait, and functional mobility.  Review HEP and update as needed.  Leigh Minerva III PT,  DPT 07/02/24 9:27 PM

## 2024-07-01 NOTE — Telephone Encounter (Signed)
 I submitted a prior auth for Mr. Chris [name] (Key: [id number]) via covermymeds for Xtampza  ER 9MG  er capsules. Per wife, patient will need to pay $118 as insurance will not cover medication. I reviewed pt's [employer] benefits. Med is listed as a NF- = Non-formulary, not covered-unless exception request granted. Wife requested that I check with [facility] to see if pt can get the drug any cheaper. Per [facility] out of pocket expense would be around $108.94.   After submitting a prior auth, I was able to get script approved for coverage. This request is approved from 07/01/2024 to 07/01/2025. I spoke with [facility] he has a $0 copay for 14 capsules. The authorization is good for 1 year.  Wife sent mychart msg to update.

## 2024-07-06 ENCOUNTER — Telehealth: Payer: Self-pay | Admitting: Cardiology

## 2024-07-06 NOTE — Telephone Encounter (Signed)
 Pt wife called in regards to PANTOPRAZOLE  stating that it had not yet been sent in for this pt  Please adivse

## 2024-07-07 ENCOUNTER — Other Ambulatory Visit: Payer: Self-pay | Admitting: Cardiology

## 2024-07-07 MED ORDER — PANTOPRAZOLE SODIUM 40 MG PO TBEC: 40.0000 mg | DELAYED_RELEASE_TABLET | Freq: Every day | ORAL | 1 refills | Status: DC

## 2024-07-29 ENCOUNTER — Inpatient Hospital Stay
Admission: RE | Admit: 2024-07-29 | Discharge: 2024-07-29 | Disposition: A | Source: Ambulatory Visit | Attending: Internal Medicine

## 2024-07-29 ENCOUNTER — Encounter: Payer: Self-pay | Admitting: Internal Medicine

## 2024-07-29 DIAGNOSIS — C3411 Malignant neoplasm of upper lobe, right bronchus or lung: Secondary | ICD-10-CM

## 2024-07-29 MED ORDER — IOPAMIDOL (ISOVUE-300) INJECTION 61%
100.0000 mL | Freq: Once | INTRAVENOUS | Status: AC | PRN
Start: 2024-07-29 — End: 2024-07-29
  Administered 2024-07-29: 100 mL via INTRAVENOUS

## 2024-07-31 ENCOUNTER — Encounter: Payer: Self-pay | Admitting: Internal Medicine

## 2024-08-05 ENCOUNTER — Encounter: Payer: Self-pay | Admitting: Pulmonary Disease

## 2024-08-05 ENCOUNTER — Inpatient Hospital Stay: Admitting: Internal Medicine

## 2024-08-05 ENCOUNTER — Inpatient Hospital Stay

## 2024-08-05 MED ORDER — AZITHROMYCIN 500 MG PO TABS: 500.0000 mg | ORAL_TABLET | Freq: Every day | ORAL | 0 refills | Status: AC

## 2024-08-05 MED ORDER — METHYLPREDNISOLONE 4 MG PO TBPK: ORAL_TABLET | ORAL | 0 refills | Status: DC

## 2024-08-05 MED ORDER — LEVALBUTEROL TARTRATE 45 MCG/ACT IN AERO: 2.0000 | INHALATION_SPRAY | Freq: Four times a day (QID) | RESPIRATORY_TRACT | 2 refills | Status: AC | PRN

## 2024-08-05 NOTE — Telephone Encounter (Signed)
 He was supposed to see me 3 months after his April appointment which would have been July.  He never made follow-up appointment.  Make sure he quit smoking.  We will send the Medrol  Dosepak and antibiotic prescription to the pharmacy.  Will also send a prescription for rescue inhaler.  Make sure he makes an appointment for follow-up in the next week or 2.  If he worsens in the interim recommend ED.

## 2024-08-09 ENCOUNTER — Encounter: Payer: Self-pay | Admitting: Internal Medicine

## 2024-08-10 ENCOUNTER — Ambulatory Visit
Admission: RE | Admit: 2024-08-10 | Discharge: 2024-08-10 | Disposition: A | Discharge status: Home / Self Care | Source: Ambulatory Visit | Attending: Pulmonary Disease | Admitting: Pulmonary Disease

## 2024-08-10 ENCOUNTER — Encounter: Payer: Self-pay | Admitting: Pulmonary Disease

## 2024-08-10 ENCOUNTER — Ambulatory Visit (INDEPENDENT_AMBULATORY_CARE_PROVIDER_SITE_OTHER): Admitting: Pulmonary Disease

## 2024-08-10 VITALS — BP 110/66 | HR 78 | Temp 97.6°F | Ht 71.0 in | Wt 242.0 lb

## 2024-08-10 DIAGNOSIS — J440 Chronic obstructive pulmonary disease with acute lower respiratory infection: Secondary | ICD-10-CM | POA: Diagnosis present

## 2024-08-10 DIAGNOSIS — F17210 Nicotine dependence, cigarettes, uncomplicated: Secondary | ICD-10-CM

## 2024-08-10 DIAGNOSIS — R051 Acute cough: Secondary | ICD-10-CM | POA: Diagnosis present

## 2024-08-10 DIAGNOSIS — C3411 Malignant neoplasm of upper lobe, right bronchus or lung: Secondary | ICD-10-CM | POA: Diagnosis not present

## 2024-08-10 DIAGNOSIS — J4489 Other specified chronic obstructive pulmonary disease: Secondary | ICD-10-CM

## 2024-08-10 MED ORDER — BREZTRI AEROSPHERE 160-9-4.8 MCG/ACT IN AERO: 2.0000 | INHALATION_SPRAY | Freq: Two times a day (BID) | RESPIRATORY_TRACT | Status: AC

## 2024-08-10 NOTE — Patient Instructions (Addendum)
 VISIT SUMMARY:  Today, you were seen for an exacerbation of your chronic obstructive pulmonary disease (COPD), which has caused increased wheezing and shortness of breath. You have completed a course of Medrol  Dosepak and antibiotics, and you are using your inhalers regularly.  YOUR PLAN:  -CHRONIC OBSTRUCTIVE PULMONARY DISEASE (COPD) WITH RECENT EXACERBATION: COPD is a chronic lung condition that makes it hard to breathe. Your recent symptoms were likely triggered by a viral infection or bronchitis. You have been given samples of Breztri , a maintenance inhaler, to use 2 puffs twice daily, and you will continue using your rescue inhaler as needed, up to four times a day.  Make sure you rinse your mouth well after use of Breztri .  At this point it does not appear that you need any further antibiotic or steroids.  A chest x-ray has been ordered to rule out pneumonia, and you will need to follow up in 3-4 weeks.  Please let us  know how you do with the Breztri  so we can call the prescription in for you.  INSTRUCTIONS:  Please get a chest x-ray as ordered to rule out pneumonia. Continue using your rescue inhaler as needed, up to four times daily, and start using the prescribed maintenance inhaler twice daily. Schedule a follow-up appointment in 3-4 weeks.

## 2024-08-10 NOTE — Progress Notes (Signed)
 Subjective:    Patient ID: Chris [name], male    DOB: 1961/01/02, 63 y.o.   MRN: [id number]  Patient Care Team: Weyman Bright, MD as PCP - General (Internal Medicine) Court Dorn PARAS, MD as PCP - Cardiology (Cardiology) Sherrod Sherrod, MD as Consulting Physician (Oncology) Weyman Bright, MD as Referring Physician (Internal Medicine) Rennie Cindy SAUNDERS, MD as Consulting Physician (Internal Medicine)  Chief Complaint  Patient presents with   COPD    Cough with yellow phlegm. Wheezing.     BACKGROUND/INTERVAL:Chris [name] is a 63 y.o. current smoker, with metastatic stage IV non-small cell lung cancer/favor adenocarcinoma, who follows here for management of COPD.  Previously followed by Drs. Byrum and Icard.  I first saw him on 31 December 2023.  I last saw the patient on 12 February 2024 and he was instructed to return for follow-up in 3 months time which he missed.  Recently developed symptoms of bronchitis and was treated with Medrol  Dosepak and azithromycin .  This is a follow-up from that telephone intervention on 05 August 2024.  HPI Discussed the use of AI scribe software for clinical note transcription with the patient, who gave verbal consent to proceed.  History of Present Illness   Chris [name] is a 63 year old male with COPD who presents with an exacerbation of symptoms.  He reports increased wheezing and shortness of breath. He has completed a course of Medrol  Dosepak and antibiotics. He uses inhalers regularly, taking them once in the morning and once in the evening. He is unsure of the name of his maintenance inhaler but describes it as a 'yellow one'.  This is Breztri .  He also uses a rescue inhaler, which he uses up to four times a day, including before leaving the house, and keeps one in his truck.  His wife is concerned due to his history of lung cancer. He notes that his sister may have transmitted a virus to him, as there are many viruses and cases  of bronchitis circulating currently.   He unfortunately continues to smoke which is going to delay healing.      Review of Systems A 10 point review of systems was performed and it is as noted above otherwise negative.   Patient Active Problem List   Diagnosis Date Noted   Closed fracture of right hip requiring operative repair with nonunion, subsequent encounter 02/26/2024   Cognitive changes 01/31/2024   Displaced intertrochanteric fracture of right femur, subsequent encounter for closed fracture with nonunion 04/03/2023   Subtrochanteric fracture of right femur, closed, with nonunion, subsequent encounter 03/21/2023   Abnormal liver CT 01/07/2023   Primary adenocarcinoma of upper lobe of right lung (HCC) 10/18/2021   Encounter for antineoplastic chemotherapy 10/18/2021   Encounter to establish care 10/18/2021   Right upper lobe pulmonary nodule 09/27/2021   Squamous cell carcinoma of scalp 09/13/2021   Tobacco abuse 04/08/2018   Malignant neoplasm of prostate (HCC) 03/25/2018   Malignant neoplasm of prostate metastatic to bone (HCC) 03/25/2018   Coronary artery disease 10/27/2013   Hyperlipidemia 10/27/2013    Social History   Tobacco Use   Smoking status: Every Day    Current packs/day: 0.75    Average packs/day: 0.8 packs/day for 42.0 years (31.5 ttl pk-yrs)    Types: Cigarettes   Smokeless tobacco: Never   Tobacco comments:    Smokes 0.25 PPD - [facility] - 02/12/24     Started smoking at 63 years old  Smoked 2 PPD at his heaviest.   Substance Use Topics   Alcohol  use: No    Allergies  Allergen Reactions   Pollen Extract Cough    Current Meds  Medication Sig   atorvastatin  (LIPITOR) 40 MG tablet Take 1 tablet (40 mg total) by mouth daily.   budesonide -glycopyrrolate -formoterol  (BREZTRI  AEROSPHERE) 160-9-4.8 MCG/ACT AERO inhaler Inhale 2 puffs into the lungs in the morning and at bedtime.   clopidogrel  (PLAVIX ) 75 MG tablet Take 1 tablet (75 mg total) by mouth  daily.   levalbuterol (XOPENEX HFA) 45 MCG/ACT inhaler Inhale 2 puffs into the lungs every 6 (six) hours as needed for wheezing or shortness of breath.   metoprolol  tartrate (LOPRESSOR ) 25 MG tablet Take 1 tablet (25 mg total) by mouth daily.   oxyCODONE  ER (XTAMPZA  ER) 9 MG C12A Take 9 mg by mouth in the morning and at bedtime.   Oxycodone  HCl 10 MG TABS Take 0.5-1 tablets (5-10 mg total) by mouth every 4 (four) hours as needed.   pantoprazole  (PROTONIX ) 40 MG tablet Take 1 tablet (40 mg total) by mouth daily.   tamsulosin  (FLOMAX ) 0.4 MG CAPS capsule Take 1 capsule (0.4 mg total) by mouth daily.   Vitamin D , Ergocalciferol , (DRISDOL ) 1.25 MG (50000 UNIT) CAPS capsule TAKE 1 CAPSULE BY MOUTH ONE TIME PER WEEK    Immunization History  Administered Date(s) Administered   PFIZER(Purple Top)SARS-COV-2 Vaccination 06/11/2020, 07/02/2020        Objective:     BP 110/66   Pulse 78   Temp 97.6 F (36.4 C) (Temporal)   Ht 5' 11 (1.803 m)   Wt 242 lb (109.8 kg)   SpO2 98%   BMI 33.75 kg/m   SpO2: 98 %  GENERAL: Well-developed, well-nourished gentleman, no acute distress.  Presents in transport chair.  No conversational dyspnea. HEAD: Normocephalic, atraumatic.  EYES: Pupils equal, round, reactive to light.  No scleral icterus.  MOUTH: Upper dentures, poor dentition lower, oral mucosa moist. NECK: Supple. No thyromegaly. Trachea midline. No JVD.  No adenopathy. PULMONARY: Good air entry bilaterally.  Coarse, otherwise, no adventitious sounds noted today. CARDIOVASCULAR: S1 and S2. Regular rate and rhythm.  ABDOMEN: Benign. MUSCULOSKELETAL: No joint deformity, no clubbing, no edema.  NEUROLOGIC: No overt focal deficit. SKIN: Intact,warm,dry. PSYCH: Mood and behavior normal.        Assessment & Plan:     ICD-10-CM   1. COPD with acute lower respiratory infection (HCC)  J44.0 DG Chest 2 View    2. COPD with chronic bronchitis and emphysema (HCC)  J44.89    J43.9     3.  Primary adenocarcinoma of upper lobe of right lung (HCC)  C34.11     4. Acute cough  R05.1 DG Chest 2 View    5. Tobacco dependence due to cigarettes  F17.210       Orders Placed This Encounter  Procedures   DG Chest 2 View    Standing Status:   Future    Number of Occurrences:   1    Expected Date:   08/10/2024    Expiration Date:   08/10/2025    Reason for Exam (SYMPTOM  OR DIAGNOSIS REQUIRED):   Productive cough, copd exacerbation    Preferred imaging location?:   [facility]    Meds ordered this encounter  Medications   budesonide -glycopyrrolate -formoterol  (BREZTRI  AEROSPHERE) 160-9-4.8 MCG/ACT AERO inhaler    Sig: Inhale 2 puffs into the lungs in the morning and at bedtime.  Dispense:  2 each    Lot Number?:   [id number]    Expiration Date?:   08/29/2026    Manufacturer?:   [employer] [[id number]]    NDC:   9689-5383-71 [[id number]]    Quantity:   2   Discussion:    Chronic obstructive pulmonary disease (COPD) with recent exacerbation Recent exacerbation of COPD, likely triggered by a viral infection or bronchitis, with symptoms of wheezing and shortness of breath. Completed Medrol  Dosepak and antibiotics. Currently using a rescue inhaler up to four times daily. Not using a maintenance inhaler regularly, potentially contributing to frequent rescue inhaler use. Chest x-ray ordered to rule out pneumonia due to recent exacerbation and cancer history, despite recent clear CT. - Prescribe a maintenance inhaler for twice daily use (Breztri  2 puffs twice a day). - Provide samples of the maintenance inhaler. - Instruct to continue using the rescue inhaler as needed, up to four times daily. - Order chest x-ray to rule out pneumonia. - Schedule follow-up appointment in 3-4 weeks.     Tobacco dependence due to cigarettes Patient counseled regards to discontinuation of smoking.  Advised if symptoms do not improve or worsen, to please contact office for sooner follow up or seek  emergency care.    I spent 33 minutes of dedicated to the care of this patient on the date of this encounter to include pre-visit review of records, face-to-face time with the patient discussing conditions above, post visit ordering of testing, clinical documentation with the electronic health record, making appropriate referrals as documented, and communicating necessary findings to members of the patients care team.     C. Leita Sanders, MD Advanced Bronchoscopy P[facility]    *This note was generated using voice recognition software/Dragon and/or AI transcription program.  Despite best efforts to proofread, errors can occur which can change the meaning. Any transcriptional errors that result from this process are unintentional and may not be fully corrected at the time of dictation.

## 2024-08-10 NOTE — Telephone Encounter (Signed)
Patient was seen in the office today.  Nothing further needed.

## 2024-08-11 ENCOUNTER — Ambulatory Visit: Admitting: Pulmonary Disease

## 2024-08-11 ENCOUNTER — Ambulatory Visit: Payer: Self-pay | Admitting: Pulmonary Disease

## 2024-08-11 NOTE — Telephone Encounter (Signed)
 Patient's wife Almarie advised of normal chest x-ray. Patient has not picked-up Xopenex. Advised to use Xopenex for the next 2-3 days along with Breztri  for the wheezing. Patient agreed. NFN.

## 2024-08-11 NOTE — Telephone Encounter (Signed)
 See xray result message.  Nothing further needed.

## 2024-08-11 NOTE — Telephone Encounter (Signed)
 The new inhaler (Breztri ) is to help with those symptoms he is having.  He should use it 2 puffs twice a day and rinse his mouth well after use.  He did not exhibit any wheezing when I examined him yesterday.  He did likely have a bronchitis that was treated with the Medrol  and azithromycin , and he may have some lingering symptoms for a few more days and then should improve.

## 2024-08-13 ENCOUNTER — Encounter: Payer: Self-pay | Admitting: Nurse Practitioner

## 2024-08-13 ENCOUNTER — Inpatient Hospital Stay: Attending: Internal Medicine

## 2024-08-13 ENCOUNTER — Inpatient Hospital Stay (HOSPITAL_BASED_OUTPATIENT_CLINIC_OR_DEPARTMENT_OTHER): Admitting: Nurse Practitioner

## 2024-08-13 VITALS — BP 129/70 | HR 82 | Temp 98.4°F | Resp 20 | Ht 71.0 in | Wt 242.0 lb

## 2024-08-13 DIAGNOSIS — R5383 Other fatigue: Secondary | ICD-10-CM

## 2024-08-13 DIAGNOSIS — Z9221 Personal history of antineoplastic chemotherapy: Secondary | ICD-10-CM | POA: Diagnosis not present

## 2024-08-13 DIAGNOSIS — C3411 Malignant neoplasm of upper lobe, right bronchus or lung: Secondary | ICD-10-CM | POA: Insufficient documentation

## 2024-08-13 DIAGNOSIS — Z79899 Other long term (current) drug therapy: Secondary | ICD-10-CM | POA: Insufficient documentation

## 2024-08-13 DIAGNOSIS — C7951 Secondary malignant neoplasm of bone: Secondary | ICD-10-CM | POA: Insufficient documentation

## 2024-08-13 LAB — CBC WITH DIFFERENTIAL (CANCER CENTER ONLY)
Abs Immature Granulocytes: 0.03 K/uL (ref 0.00–0.07)
Basophils Absolute: 0 K/uL (ref 0.0–0.1)
Basophils Relative: 0 %
Eosinophils Absolute: 0.1 K/uL (ref 0.0–0.5)
Eosinophils Relative: 2 %
HCT: 42.5 % (ref 39.0–52.0)
Hemoglobin: 14.2 g/dL (ref 13.0–17.0)
Immature Granulocytes: 0 %
Lymphocytes Relative: 20 %
Lymphs Abs: 1.4 K/uL (ref 0.7–4.0)
MCH: 28.2 pg (ref 26.0–34.0)
MCHC: 33.4 g/dL (ref 30.0–36.0)
MCV: 84.5 fL (ref 80.0–100.0)
Monocytes Absolute: 0.7 K/uL (ref 0.1–1.0)
Monocytes Relative: 10 %
Neutro Abs: 5 K/uL (ref 1.7–7.7)
Neutrophils Relative %: 68 %
Platelet Count: 238 K/uL (ref 150–400)
RBC: 5.03 MIL/uL (ref 4.22–5.81)
RDW: 16.5 % — ABNORMAL HIGH (ref 11.5–15.5)
WBC Count: 7.4 K/uL (ref 4.0–10.5)
nRBC: 0 % (ref 0.0–0.2)

## 2024-08-13 LAB — CMP (CANCER CENTER ONLY)
ALT: 17 U/L (ref 0–44)
AST: 19 U/L (ref 15–41)
Albumin: 3.9 g/dL (ref 3.5–5.0)
Alkaline Phosphatase: 89 U/L (ref 38–126)
Anion gap: 9 (ref 5–15)
BUN: 24 mg/dL — ABNORMAL HIGH (ref 8–23)
CO2: 23 mmol/L (ref 22–32)
Calcium: 8.8 mg/dL — ABNORMAL LOW (ref 8.9–10.3)
Chloride: 106 mmol/L (ref 98–111)
Creatinine: 1.3 mg/dL — ABNORMAL HIGH (ref 0.61–1.24)
GFR, Estimated: >60 mL/min (ref >60)
Glucose, Bld: 125 mg/dL — ABNORMAL HIGH (ref 70–99)
Potassium: 3.9 mmol/L (ref 3.5–5.1)
Sodium: 138 mmol/L (ref 135–145)
Total Bilirubin: 0.6 mg/dL (ref 0.0–1.2)
Total Protein: 7.1 g/dL (ref 6.5–8.1)

## 2024-08-13 NOTE — Addendum Note (Signed)
 Addended by: LAEL BROWNING A on: 08/13/2024 05:36 PM   Modules accepted: Orders

## 2024-08-13 NOTE — Progress Notes (Signed)
 CT CAP 07/29/24. Xray chest 08/10/24. Having cough and congestion, states it hurts in his chest. Wife states no energy, sleeps a lot.

## 2024-08-13 NOTE — Progress Notes (Signed)
 [facility] CONSULT NOTE  Patient Care Team: Weyman Bright, MD as PCP - General (Internal Medicine) Court Dorn PARAS, MD as PCP - Cardiology (Cardiology) Sherrod Sherrod, MD as Consulting Physician (Oncology) Weyman Bright, MD as Referring Physician (Internal Medicine) Rennie Cindy SAUNDERS, MD as Consulting Physician (Internal Medicine)  CHIEF COMPLAINTS/PURPOSE OF CONSULTATION: lung cancer  #  Oncology History Overview Note  DIAGNOSIS: 1) stage IV (T1c, N0, M1 C) non-small cell lung cancer favoring adenocarcinoma presented with right lung apical nodule in addition to metastatic disease in the right hepatic lobe and the proximal right femoral diaphysis diagnosed and December 2022. 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    QNS; Guardant 360 showed positive KRAS G12C mutation  FINAL MICROSCOPIC DIAGNOSIS:   A. LUNG, RUL, FINE NEEDLE ASPIRATION:  - Malignant cells consistent with non-small cell carcinoma, see comment   B. LUNG, RUL, BRUSHING:  - Malignant cells consistent with non-small cell carcinoma, see comment       COMMENT:   A and B.  Dr. Belvie reviewed the case and concurs with the diagnosis.  Only rare malignant cells are present on the cellblock.  Immunohistochemical stains were attempted and show that the tumor cells  have patchy staining for TTF-1 whereas p63, p40 and CK5/6 are negative.  The findings are nondiagnostic but suggestive of a lung adenocarcinoma.  Dr. Shelah was notified on 10/13/2021.   SEP-OCT 2022-  [Dermatology]-   Poorly differentiated squamous cell carcinoma; -  Carcinoma extends to the edges of the excision specimen   IMPRESSION: 1. The spiculated nodule at the right lung apex on recent neck CT is hypermetabolic and is concerning for primary bronchogenic carcinoma. Tissue sampling recommended. 2. Hypermetabolic activity within the  occipital scalp and small previously demonstrated right occipital lymph node compatible with known squamous cell carcinoma. Evaluation of the head and neck limited by motion artifact. 3. Hypermetabolic lesions inferiorly in the right hepatic lobe and in the proximal right femoral diaphysis suspicious for metastatic disease, primary uncertain in this patient with a history of prostate cancer. Correlate with PSA levels. Abdominal MRI without and with contrast may be helpful for further characterization of the liver lesion.  # s/p RT to RUL [[place]]; right hip RT; # BONE METASTASES:  Impending right hip fracture right hip pain-status post radiation [[facility]; DEC 2022]- S/p  Re-RT on 10/03 x5 Fx. NOV 2023-status post intramedullary nail fixation;JUNE 2024 [[place]] Re-surgery-    Awaiting dental clearance for zometa.    # 2007MI-CAD [s/p stent-Dr.Berry; EF 2021- 58%]     Primary adenocarcinoma of upper lobe of right lung (HCC)  10/18/2021 Initial Diagnosis   Primary adenocarcinoma of upper lobe of right lung (HCC)   10/18/2021 Cancer Staging   Staging form: Lung, AJCC 8th Edition - Clinical: Stage IVB (cT1c, cN0, cM1c) - Signed by Sherrod Sherrod, MD on 10/18/2021   11/01/2021 - 05/30/2022 Chemotherapy   Patient is on Treatment Plan : LUNG NSCLC Pemetrexed  + Carboplatin  q21d x 4 Cycles     11/01/2021 -  Chemotherapy   Patient is on Treatment Plan : LUNG NSCLC Libtayo  q  21d       HISTORY OF PRESENTING ILLNESS: Ambulating with a cane.  Accompanied by his wife.  BROWNIE NEHME 63 y.o. male with metastatic stage IV non-small cell lung cancer adenocarcinoma, currently off maintenance IO, on surveillance, who returns to clinic for imaging results and follow-up.  He continues  to recover from his femur/hip surgery.  Pain has improved significantly.  He rarely takes oxycodone  now.  He has ongoing fatigue and takes an 2-hour nap daily.  Sometimes longer on the weekends.  Weight is stable.  He has had a  cough with increased phlegm for a few weeks and received antibiotics from pulmonology as well as steroids.  Has not worsened.  Review of Systems  Constitutional:  Positive for malaise/fatigue. Negative for chills, diaphoresis, fever and weight loss.  HENT:  Positive for hearing loss. Negative for nosebleeds and sore throat.   Eyes:  Negative for double vision.  Respiratory:  Negative for cough, hemoptysis, sputum production, shortness of breath and wheezing.   Cardiovascular:  Negative for chest pain, palpitations, orthopnea and leg swelling.  Gastrointestinal:  Negative for abdominal pain, blood in stool, diarrhea, heartburn, melena, nausea and vomiting.  Genitourinary:  Negative for dysuria, frequency and urgency.  Musculoskeletal:  Positive for back pain and joint pain.  Skin: Negative.  Negative for itching and rash.  Neurological:  Negative for dizziness, tingling, focal weakness, weakness and headaches.  Endo/Heme/Allergies:  Does not bruise/bleed easily.  Psychiatric/Behavioral:  Negative for depression. The patient does not have insomnia.      MEDICAL HISTORY:  Past Medical History:  Diagnosis Date   Anxiety    associated with medical care,  worsened by difficulty hearing, does better with wife present   Arthritis    Cancer associated pain    Cancer, metastatic to liver [facility])    Complication of anesthesia    wife states very anxious may need pre sedation   COPD (chronic obstructive pulmonary disease) (HCC)    Coronary artery disease    Dyspnea    GERD (gastroesophageal reflux disease)    Hearing loss    no hearing aids per wife   Hyperlipidemia    Hypertension    MI (myocardial infarction) (HCC) 04/17/2006   Acute inferolateral wall MI with cardiogenic shock and complete heart block   Primary adenocarcinoma of upper lobe of right lung (HCC)    Prostate cancer (HCC)    Sleep apnea    Tobacco abuse    Wears glasses    Wears partial dentures    Upper    SURGICAL  HISTORY: Past Surgical History:  Procedure Laterality Date   BRONCHIAL BIOPSY  10/09/2021   Procedure: BRONCHIAL BIOPSIES;  Surgeon: Shelah Lamar RAMAN, MD;  Location: [facility];  Service: Pulmonary;;   BRONCHIAL BRUSHINGS  10/09/2021   Procedure: BRONCHIAL BRUSHINGS;  Surgeon: Shelah Lamar RAMAN, MD;  Location: [facility];  Service: Pulmonary;;   BRONCHIAL NEEDLE ASPIRATION BIOPSY  10/09/2021   Procedure: BRONCHIAL NEEDLE ASPIRATION BIOPSIES;  Surgeon: Shelah Lamar RAMAN, MD;  Location: [facility];  Service: Pulmonary;;   CARDIAC CATHETERIZATION  2007   left, RCA 100% occluded ruptured plaque with thrombus in the proximal segment   CONVERSION TO TOTAL HIP Right 02/26/2024   Procedure: RESECTION PROXIMAL BONE TUMOR FEMUR, HEMIARTHROPLASTY, LOCAL TISSUE ADVANCEMENT;  Surgeon: Edna Toribio LABOR, MD;  Location: [facility];  Service: Orthopedics;  Laterality: Right;   CYST EXCISION N/A 08/30/2021   Procedure: EXCISION OF POSTERIOR SCALP CYST;  Surgeon: Signe Mitzie LABOR, MD;  Location: [facility];  Service: General;  Laterality: N/A;   CYSTOSCOPY N/A 07/17/2018   Procedure: PHYLLIS;  Surgeon: Matilda Senior, MD;  Location: [facility];  Service: Urology;  Laterality: N/A;  no seeds in bladder per Dr Matilda   FEMUR IM NAIL Right 04/03/2023  Procedure: REVISION FIXATION OF RIGHT FEMUR NONUNION;  Surgeon: Kendal Franky SQUIBB, MD;  Location: [facility];  Service: Orthopedics;  Laterality: Right;   FIDUCIAL MARKER PLACEMENT  10/09/2021   Procedure: FIDUCIAL MARKER PLACEMENT;  Surgeon: Shelah Lamar RAMAN, MD;  Location: [facility];  Service: Pulmonary;;   HAND SURGERY Right    has metal plate in arm   HARDWARE REMOVAL Right 04/03/2023   Procedure: REMOVAL OF PREVIOUS HARDWARE FEMUR;  Surgeon: Kendal Franky SQUIBB, MD;  Location: [facility];  Service: Orthopedics;  Laterality: Right;   PROSTATE BIOPSY     RADIOACTIVE SEED IMPLANT N/A 07/17/2018   Procedure: RADIOACTIVE SEED IMPLANT/BRACHYTHERAPY IMPLANT;   Surgeon: Matilda Senior, MD;  Location: [facility];  Service: Urology;  Laterality: N/A;  77 seeds   SPACE OAR INSTILLATION N/A 07/17/2018   Procedure: SPACE OAR INSTILLATION;  Surgeon: Matilda Senior, MD;  Location: [facility];  Service: Urology;  Laterality: N/A;   VIDEO BRONCHOSCOPY WITH RADIAL ENDOBRONCHIAL ULTRASOUND  10/09/2021   Procedure: VIDEO BRONCHOSCOPY WITH RADIAL ENDOBRONCHIAL ULTRASOUND;  Surgeon: Shelah Lamar RAMAN, MD;  Location: [facility];  Service: Pulmonary;;   WRIST SURGERY  10/04/2012    SOCIAL HISTORY: Social History   Socioeconomic History   Marital status: Married    Spouse name: Not on file   Number of children: Not on file   Years of education: Not on file   Highest education level: Not on file  Occupational History   Not on file  Tobacco Use   Smoking status: Every Day    Current packs/day: 0.75    Average packs/day: 0.8 packs/day for 42.0 years (31.5 ttl pk-yrs)    Types: Cigarettes   Smokeless tobacco: Never   Tobacco comments:    Smokes 0.25 PPD - [facility] - 02/12/24     Started smoking at 63 years old    Smoked 2 PPD at his heaviest.   Vaping Use   Vaping status: Never Used  Substance and Sexual Activity   Alcohol  use: No   Drug use: No   Sexual activity: Yes    Birth control/protection: None  Other Topics Concern   Not on file  Social History Narrative   10-04 19 Unable to ask abuse questions wife with him today.   Are you right handed or left handed? Ambidextrous prominent right   Are you currently employed ? 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 no   Who lives with you? Wife and patient   What type of home do you live in: 1 story or 2 story? 1 story       Social Drivers of Corporate investment banker Strain: Not on file  Food Insecurity: No Food Insecurity (02/26/2024)   Hunger Vital Sign    Worried About Running Out of Food in the Last Year: Never  true    Ran Out of Food in the Last Year: Never true  Transportation Needs: No Transportation Needs (02/26/2024)   PRAPARE - Administrator, Civil Service (Medical): No    Lack of Transportation (Non-Medical): No  Physical Activity: Not on file  Stress: Not on file  Social Connections: Not on file  Intimate Partner Violence: Not At Risk (02/26/2024)   Humiliation, Afraid, Rape, and Kick questionnaire    Fear of Current or Ex-Partner: No    Emotionally Abused: No    Physically Abused: No    Sexually Abused:  No    FAMILY HISTORY: Family History  Problem Relation Age of Onset   Breast cancer Mother    Prostate cancer Neg Hx    Kidney cancer Neg Hx    Cancer Neg Hx     ALLERGIES:  is allergic to pollen extract.  MEDICATIONS:  Current Outpatient Medications  Medication Sig Dispense Refill   atorvastatin  (LIPITOR) 40 MG tablet Take 1 tablet (40 mg total) by mouth daily. 90 tablet 3   budesonide -glycopyrrolate -formoterol  (BREZTRI  AEROSPHERE) 160-9-4.8 MCG/ACT AERO inhaler Inhale 2 puffs into the lungs in the morning and at bedtime. 2 each    clopidogrel  (PLAVIX ) 75 MG tablet Take 1 tablet (75 mg total) by mouth daily.     pantoprazole  (PROTONIX ) 40 MG tablet Take 1 tablet (40 mg total) by mouth daily. 30 tablet 1   tamsulosin  (FLOMAX ) 0.4 MG CAPS capsule Take 1 capsule (0.4 mg total) by mouth daily. 90 capsule 3   Vitamin D , Ergocalciferol , (DRISDOL ) 1.25 MG (50000 UNIT) CAPS capsule TAKE 1 CAPSULE BY MOUTH ONE TIME PER WEEK 12 capsule 1   levalbuterol (XOPENEX HFA) 45 MCG/ACT inhaler Inhale 2 puffs into the lungs every 6 (six) hours as needed for wheezing or shortness of breath. (Patient not taking: Reported on 08/13/2024) 1 each 2   metoprolol  tartrate (LOPRESSOR ) 25 MG tablet Take 1 tablet (25 mg total) by mouth daily. 90 tablet 3   oxyCODONE  ER (XTAMPZA  ER) 9 MG C12A Take 9 mg by mouth in the morning and at bedtime. (Patient not taking: Reported on 08/13/2024) 14 capsule  0   Oxycodone  HCl 10 MG TABS Take 0.5-1 tablets (5-10 mg total) by mouth every 4 (four) hours as needed. (Patient not taking: Reported on 08/13/2024) 60 tablet 0   No current facility-administered medications for this visit.    PHYSICAL EXAMINATION: ECOG PERFORMANCE STATUS: 1 - Symptomatic but completely ambulatory  Vitals:   08/13/24 1535  BP: 129/70  Pulse: 82  Resp: 20  Temp: 98.4 F (36.9 C)  SpO2: 99%   Filed Weights   08/13/24 1535  Weight: 242 lb (109.8 kg)    Physical Exam Vitals reviewed.  HENT:     Head: Normocephalic and atraumatic.     Mouth/Throat:     Pharynx: Oropharynx is clear.  Eyes:     Extraocular Movements: Extraocular movements intact.     Pupils: Pupils are equal, round, and reactive to light.  Cardiovascular:     Rate and Rhythm: Normal rate and regular rhythm.  Pulmonary:     Effort: No respiratory distress.     Comments: Decreased breath sounds bilaterally.  Abdominal:     General: There is no distension.     Palpations: Abdomen is soft.  Skin:    General: Skin is warm.     Coloration: Skin is not pale.  Neurological:     Mental Status: He is alert and oriented to person, place, and time.  Psychiatric:        Mood and Affect: Mood normal.        Behavior: Behavior normal.     LABORATORY DATA:  I have reviewed the data as listed Lab Results  Component Value Date   WBC 7.4 08/13/2024   HGB 14.2 08/13/2024   HCT 42.5 08/13/2024   MCV 84.5 08/13/2024   PLT 238 08/13/2024   Recent Labs    12/27/23 0824 02/24/24 0948 02/27/24 0334 08/13/24 1521  NA 138 139 135 138  K 4.0 4.4 3.6 3.9  CL 105 104 104 106  CO2 23 25 21* 23  GLUCOSE 105* 121* 136* 125*  BUN 20 12 12  24*  CREATININE 0.97 1.01 1.02 1.30*  CALCIUM  9.0 9.5 8.4* 8.8*  GFRNONAA >60 >60 >60 >60  PROT 7.6 7.4  --  7.1  ALBUMIN  4.2 4.1  --  3.9  AST 19 19  --  19  ALT 23 21  --  17  ALKPHOS 75 66  --  89  BILITOT 0.6 0.8  --  0.6    RADIOGRAPHIC STUDIES: I  have personally reviewed the radiological images as listed and agreed with the findings in the report. DG Chest 2 View Result Date: 08/11/2024 CLINICAL DATA:  Productive cough, COPD exacerbation EXAM: CHEST - 2 VIEW COMPARISON:  12/31/2023 FINDINGS: The heart size and mediastinal contours are within normal limits. Both lungs are clear. The visualized skeletal structures are unremarkable. IMPRESSION: No acute abnormality of the lungs. Electronically Signed   By: Marolyn JONETTA Jaksch M.D.   On: 08/11/2024 10:07   CT CHEST ABDOMEN PELVIS W CONTRAST Result Date: 08/01/2024 CLINICAL DATA:  History of lung cancer, follow-up. * Tracking Code: BO * EXAM: CT CHEST, ABDOMEN, AND PELVIS WITH CONTRAST TECHNIQUE: Multidetector CT imaging of the chest, abdomen and pelvis was performed following the standard protocol during bolus administration of intravenous contrast. RADIATION DOSE REDUCTION: This exam was performed according to the departmental dose-optimization program which includes automated exposure control, adjustment of the mA and/or kV according to patient size and/or use of iterative reconstruction technique. CONTRAST:  100mL ISOVUE -300 IOPAMIDOL  (ISOVUE -300) INJECTION 61% COMPARISON:  Multiple priors including CT May 10, 2024 FINDINGS: CT CHEST FINDINGS Cardiovascular: Aortic atherosclerosis. Coronary artery calcifications/stents. Borderline cardiac enlargement. Mediastinum/Nodes: No suspicious thyroid  nodule. No pathologically enlarged mediastinal, hilar or axillary lymph nodes. The esophagus is grossly unremarkable. Lungs/Pleura: Treated nodule in the right lung apex measures 13 x 7 mm on image 16/5 previously 12 x 9 mm. Similar posttreatment change in the right lung apex with fiducial marker in place. Similar scarring/atelectasis in the inferior aspect of the lingula on image 104/5. Stable 4 mm triangular nodule along the left major fissure on image 76/5. Musculoskeletal: No aggressive lytic or blastic lesion of  bone. Thoracic spondylosis. CT ABDOMEN PELVIS FINDINGS Hepatobiliary: No suspicious hepatic lesion. Gallbladder is unremarkable. No biliary ductal dilation. Pancreas: No pancreatic ductal dilation or evidence of acute inflammation. Spleen: No splenomegaly. Adrenals/Urinary Tract: Stable bilateral adrenal nodularity in thickening previously described as adenomas. No new suspicious nodularity. Nonobstructive left renal calculi measure up to 9 mm. Kidneys demonstrate symmetric enhancement. Urinary bladder is unremarkable for degree of distension within limitation of streak artifact from right hip arthroplasty. Stomach/Bowel: Stomach is nondistended. No pathologic dilation of small or large bowel. Colonic stool burden compatible with constipation. Colonic diverticulosis. Noninflamed appendix. Vascular/Lymphatic: Normal caliber abdominal aorta. Aortic atherosclerosis. Smooth IVC contours. The portal, splenic and superior mesenteric veins are patent. 7 mm short axis right common iliac lymph node on image 103/2 previously measured 9 mm Reproductive: Brachytherapy seeds in the prostate gland. Other: No significant abdominopelvic free fluid. Musculoskeletal: No aggressive lytic or blastic lesion of bone. Right total hip arthroplasty. Lumbar spondylosis. IMPRESSION: 1. Stable examination without evidence of new or progressive disease in the chest, abdomen or pelvis. 2. Treated nodule in the right lung apex is stable to minimally decreased in size with similar adjacent posttreatment change. 3. Decreased size of the 7 mm short axis nonspecific right common iliac lymph node. 4. Stable bilateral adrenal  nodularity in thickening previously described as adenomas. 5. Nonobstructive left renal calculi measure up to 9 mm. 6. Colonic diverticulosis without evidence of acute diverticulitis. 7. Colonic stool burden compatible with constipation. Electronically Signed   By: Reyes Holder M.D.   On: 08/01/2024 13:07    ASSESSMENT &  PLAN:   Primary adenocarcinoma of upper lobe of right lung (HCC) # Non-small cell lung cancer/stage IV-favor adeno carcinoma-metastases to right femur/liver; synchronous squamous cell carcinoma scalp  JUNE 13th, 2025-  Unchanged treated nodule of the right pulmonary apex measuring 1.2 x 0.9 cm with adjacent radiation change;  Newly enlarged right iliac lymph nodes measuring up to 1.1 x 0.9 cm, suspicious for nodal metastatic disease, primarily of prostate origin given distribution.  PSA- June 2025- < 0.1 [PCP]; Otherwise no evidence of lymphadenopathy or metastatic disease in the chest, abdomen, or pelvis.JAN 3rd, 2025- last libtayo .    # CONTINUE to HOLD Single agent Libtayo  maintenance [given the ongoing severe fatigue memory issues and overall stability of the disease]- Labs today reviewed. CT C/A/P from 07/29/24 reviewed. No evidence of metasatic disease. Stable. Clinically stable. Continue surveillance with imaging every 3 months.    # Memory loss especially short-term-question etiology- JAN 2025- MRI brain negative for any metastases. S/p evaluation Dr. Buckley- stable.    # BONE METASTASES: Hx Impending right hip fracture right hip pain-status post radiation [[facility]; DEC 2022]- S/p  Re-RT on 10/03 x5 Fx. NOV 2023-status post intramedullary nail fixation;JUNE 2024 [[place]] status post-revision surgery May 2025.  Stable.  Hold Zometa for now.   # Low vit D- [July 2023- vit D- 25]; on  ergocalciferol - Calcium  9.5 improved/stable. FEB vit D-25-OH- 41. Stable.      # Squamous cell carcinoma of the scalp-s/p excision; positive margins- on libtayo -no clinical evidence of progression.  Might need to consider radiation-if any local progression noted/also based on course lung cancer. Stable.      # COPD-recommend follow-up with pulmonary, [facility] [place]- Stable.  ? OSA- causing fatigue- defer to pulmonary-Stable.        # GERD- on prilosec continue PPI BID prior to meals-  Stable.      # weight loss: sec  to teeth exatraction-    # IV access: PIV   # DISPOSITION: 3 mo- ct c/a/p Week later- lab (cbc, cmp, tsh), Dr Rennie- la  No problem-specific Assessment & Plan notes found for this encounter.  All questions were answered. The patient knows to call the clinic with any problems, questions or concerns.   Tinnie KANDICE Dawn, NP 08/13/2024

## 2024-08-14 ENCOUNTER — Encounter: Payer: Self-pay | Admitting: Nurse Practitioner

## 2024-08-14 LAB — THYROID PANEL WITH TSH
Free Thyroxine Index: 1.6 (ref 1.2–4.9)
T3 Uptake Ratio: 26 % (ref 24–39)
T4, Total: 6 ug/dL (ref 4.5–12.0)
TSH: 1.08 u[IU]/mL (ref 0.450–4.500)

## 2024-08-17 ENCOUNTER — Encounter: Payer: Self-pay | Admitting: Nurse Practitioner

## 2024-08-17 ENCOUNTER — Encounter: Payer: Self-pay | Admitting: Pulmonary Disease

## 2024-08-17 ENCOUNTER — Other Ambulatory Visit: Payer: Self-pay | Admitting: *Deleted

## 2024-08-17 DIAGNOSIS — C3411 Malignant neoplasm of upper lobe, right bronchus or lung: Secondary | ICD-10-CM

## 2024-08-18 NOTE — Telephone Encounter (Signed)
 They should  observe usual precautions of hand hygiene and safe distancing.  Not all bacterial pneumonia is contagious.  Depends on the type of bacteria.  However since it is uncertain what she has just watch prudent precautions as above.  Chris [name] also needs to quit smoking as this predisposes him to infection and to worsening issues with his breathing.

## 2024-08-23 ENCOUNTER — Encounter: Payer: Self-pay | Admitting: Cardiology

## 2024-08-24 ENCOUNTER — Other Ambulatory Visit: Payer: Self-pay | Admitting: Cardiology

## 2024-08-24 MED ORDER — PANTOPRAZOLE SODIUM 40 MG PO TBEC: 40.0000 mg | DELAYED_RELEASE_TABLET | Freq: Every day | ORAL | 1 refills | Status: DC

## 2024-08-26 ENCOUNTER — Other Ambulatory Visit: Payer: Self-pay | Admitting: Cardiology

## 2024-08-28 ENCOUNTER — Encounter: Payer: Self-pay | Admitting: Internal Medicine

## 2024-08-31 ENCOUNTER — Encounter: Payer: Self-pay | Admitting: Radiology

## 2024-09-03 ENCOUNTER — Encounter: Payer: Self-pay | Admitting: Cardiovascular Disease

## 2024-09-08 ENCOUNTER — Ambulatory Visit: Admitting: Pulmonary Disease

## 2024-10-02 ENCOUNTER — Telehealth: Payer: Self-pay | Admitting: Internal Medicine

## 2024-10-02 ENCOUNTER — Encounter: Payer: Self-pay | Admitting: Internal Medicine

## 2024-10-02 NOTE — Telephone Encounter (Signed)
 Pt spouse returned my call to r/s CT - r/s CT w/pt spouse - pt spouse confirmed date/time - LH

## 2024-10-05 ENCOUNTER — Encounter: Payer: Self-pay | Admitting: Cardiovascular Disease

## 2024-10-06 ENCOUNTER — Ambulatory Visit: Admitting: Cardiovascular Disease

## 2024-10-06 NOTE — Telephone Encounter (Signed)
 FYI

## 2024-10-16 ENCOUNTER — Other Ambulatory Visit: Payer: Self-pay | Admitting: Hospice and Palliative Medicine

## 2024-10-19 ENCOUNTER — Encounter: Payer: Self-pay | Admitting: Cardiology

## 2024-10-20 ENCOUNTER — Other Ambulatory Visit: Payer: Self-pay | Admitting: *Deleted

## 2024-10-20 ENCOUNTER — Encounter: Payer: Self-pay | Admitting: Hospice and Palliative Medicine

## 2024-10-20 MED ORDER — VITAMIN D (ERGOCALCIFEROL) 1.25 MG (50000 UNIT) PO CAPS: 50000.0000 [IU] | ORAL_CAPSULE | ORAL | 1 refills | Status: AC

## 2024-10-20 MED ORDER — OXYCODONE HCL 10 MG PO TABS: 5.0000 mg | ORAL_TABLET | ORAL | 0 refills | Status: AC | PRN

## 2024-10-23 ENCOUNTER — Encounter: Payer: Self-pay | Admitting: Internal Medicine

## 2024-10-24 ENCOUNTER — Encounter: Payer: Self-pay | Admitting: Internal Medicine

## 2024-10-26 ENCOUNTER — Encounter: Payer: Self-pay | Admitting: Internal Medicine

## 2024-10-26 ENCOUNTER — Inpatient Hospital Stay: Admission: RE | Admit: 2024-10-26

## 2024-10-27 ENCOUNTER — Inpatient Hospital Stay: Admission: RE | Admit: 2024-10-27 | Discharge: 2024-10-27 | Attending: Nurse Practitioner

## 2024-10-27 DIAGNOSIS — C3411 Malignant neoplasm of upper lobe, right bronchus or lung: Secondary | ICD-10-CM

## 2024-10-27 MED ORDER — IOPAMIDOL (ISOVUE-300) INJECTION 61%
100.0000 mL | Freq: Once | INTRAVENOUS | Status: AC | PRN
  Administered 2024-10-27: 100 mL via INTRAVENOUS

## 2024-10-30 ENCOUNTER — Encounter: Payer: Self-pay | Admitting: Internal Medicine

## 2024-10-30 ENCOUNTER — Other Ambulatory Visit: Payer: Self-pay | Admitting: Cardiology

## 2024-10-30 DIAGNOSIS — C61 Malignant neoplasm of prostate: Secondary | ICD-10-CM

## 2024-10-30 DIAGNOSIS — C3411 Malignant neoplasm of upper lobe, right bronchus or lung: Secondary | ICD-10-CM

## 2024-10-30 DIAGNOSIS — R911 Solitary pulmonary nodule: Secondary | ICD-10-CM

## 2024-10-30 DIAGNOSIS — I2510 Atherosclerotic heart disease of native coronary artery without angina pectoris: Secondary | ICD-10-CM

## 2024-10-30 DIAGNOSIS — C4442 Squamous cell carcinoma of skin of scalp and neck: Secondary | ICD-10-CM

## 2024-11-04 ENCOUNTER — Encounter: Payer: Self-pay | Admitting: Internal Medicine

## 2024-11-04 NOTE — Telephone Encounter (Signed)
 Called for read. Spoke with Randine.

## 2024-11-11 ENCOUNTER — Encounter: Payer: Self-pay | Admitting: Internal Medicine

## 2024-11-11 ENCOUNTER — Inpatient Hospital Stay: Attending: Internal Medicine

## 2024-11-11 ENCOUNTER — Inpatient Hospital Stay: Admitting: Internal Medicine

## 2024-11-11 VITALS — BP 142/92 | HR 70 | Temp 98.6°F | Resp 19 | Ht 71.0 in | Wt 235.0 lb

## 2024-11-11 DIAGNOSIS — C3411 Malignant neoplasm of upper lobe, right bronchus or lung: Secondary | ICD-10-CM | POA: Diagnosis not present

## 2024-11-11 DIAGNOSIS — G9332 Myalgic encephalomyelitis/chronic fatigue syndrome: Secondary | ICD-10-CM

## 2024-11-11 LAB — CMP (CANCER CENTER ONLY)
ALT: 16 U/L (ref 0–44)
AST: 19 U/L (ref 15–41)
Albumin: 4.6 g/dL (ref 3.5–5.0)
Alkaline Phosphatase: 101 U/L (ref 38–126)
Anion gap: 15 (ref 5–15)
BUN: 16 mg/dL (ref 8–23)
CO2: 21 mmol/L — ABNORMAL LOW (ref 22–32)
Calcium: 9.6 mg/dL (ref 8.9–10.3)
Chloride: 105 mmol/L (ref 98–111)
Creatinine: 1.16 mg/dL (ref 0.61–1.24)
GFR, Estimated: >60 mL/min
Glucose, Bld: 113 mg/dL — ABNORMAL HIGH (ref 70–99)
Potassium: 4.1 mmol/L (ref 3.5–5.1)
Sodium: 141 mmol/L (ref 135–145)
Total Bilirubin: 0.5 mg/dL (ref 0.0–1.2)
Total Protein: 7.4 g/dL (ref 6.5–8.1)

## 2024-11-11 LAB — CBC WITH DIFFERENTIAL (CANCER CENTER ONLY)
Abs Immature Granulocytes: 0.02 K/uL (ref 0.00–0.07)
Basophils Absolute: 0 K/uL (ref 0.0–0.1)
Basophils Relative: 0 %
Eosinophils Absolute: 0.1 K/uL (ref 0.0–0.5)
Eosinophils Relative: 1 %
HCT: 44.6 % (ref 39.0–52.0)
Hemoglobin: 14.6 g/dL (ref 13.0–17.0)
Immature Granulocytes: 0 %
Lymphocytes Relative: 23 %
Lymphs Abs: 1.6 K/uL (ref 0.7–4.0)
MCH: 28.2 pg (ref 26.0–34.0)
MCHC: 32.7 g/dL (ref 30.0–36.0)
MCV: 86.3 fL (ref 80.0–100.0)
Monocytes Absolute: 0.9 K/uL (ref 0.1–1.0)
Monocytes Relative: 13 %
Neutro Abs: 4.3 K/uL (ref 1.7–7.7)
Neutrophils Relative %: 63 %
Platelet Count: 225 K/uL (ref 150–400)
RBC: 5.17 MIL/uL (ref 4.22–5.81)
RDW: 14.8 % (ref 11.5–15.5)
WBC Count: 6.9 K/uL (ref 4.0–10.5)
nRBC: 0 % (ref 0.0–0.2)

## 2024-11-11 LAB — TSH: TSH: 1.55 u[IU]/mL (ref 0.350–4.500)

## 2024-11-11 NOTE — Assessment & Plan Note (Addendum)
#   Non-small cell lung cancer/stage IV-favor adeno carcinoma-metastases to right femur/liver; synchronous squamous cell carcinoma scalp DEC 30th, 2025- Stable; adjacent nodules at the right lung apex with associated linear scarring with reassuring stability over time. No evidence of metastatic disease in the chest, abdomen, or pelvis  PSA- June 2025- < 0.1 [PCP]; Otherwise no evidence of lymphadenopathy or metastatic disease in the chest, abdomen, or pelvis.JAN 3rd, 2025- last libtayo .   # CONTINUE to HOLD Single agent Libtayo  maintenance [given the ongoing severe fatigue memory issues and overall stability of the disease]- Labs today reviewed; will continue surveillance-with imaging every 3-6 months or so. Will discuss at next visit  # Extreme fatigue-unlikely any CNS causes; check cortisol and also testosterone  levels.    # BONE METASTASES: Hx Impending right hip fracture right hip pain-status post radiation [[facility]; DEC 2022]- S/p  Re-RT on 10/03 x5 Fx. NOV 2023-status post intramedullary nail fixation;JUNE 2024 [[place]] status post-revision surgery May 2025.  Stable.  Hold Zometa for now.  # Low vit D- [July 2023- vit D- 25]; on  ergocalciferol - Calcium  9.5 improved/stable. FEB vit D-25-OH- 41. Stable.     # Squamous cell carcinoma of the scalp-s/p excision; positive margins- on libtayo -no clinical evidence of progression.  Might need to consider radiation-if any local progression noted/also based on course lung cancer. Stable.     # COPD-recommend follow-up with pulmonary, [facility] [place]- Stable.  ? OSA- causing fatigue- defer to pulmonary-Stable.       # GERD- on prilosec continue PPI BID prior to meals-  Stable.     # weight loss: sec to teeth exatraction-   # IV access: PIV  # DISPOSITION: # labs in AM- ordered- testosterone / cortisol-  # follow up 3 months- MD;labs- cbc/cmp;TSH;-  Dr.B   # I reviewed the blood work- with the patient in detail; also reviewed the imaging independently [as  summarized above]; and with the patient in detail.

## 2024-11-11 NOTE — Progress Notes (Signed)
 Patient wife states she does have some concerns:  1.Patient is having Terrible Night sweats 2.Patient has No appetite.  3.Feels bad/ No energy all the time. Patient wife has question about recent scans.  Refill on Vitamin D .

## 2024-11-11 NOTE — Progress Notes (Signed)
 [facility] CONSULT NOTE  Patient Care Team: Weyman Bright, MD as PCP - General (Internal Medicine) Court Dorn PARAS, MD as PCP - Cardiology (Cardiology) Sherrod Sherrod, MD as Consulting Physician (Oncology) Weyman Bright, MD as Referring Physician (Internal Medicine) Rennie Cindy SAUNDERS, MD as Consulting Physician (Oncology)  CHIEF COMPLAINTS/PURPOSE OF CONSULTATION: lung cancer  #  Oncology History Overview Note  DIAGNOSIS: 1) stage IV (T1c, N0, M1 C) non-small cell lung cancer favoring adenocarcinoma presented with right lung apical nodule in addition to metastatic disease in the right hepatic lobe and the proximal right femoral diaphysis diagnosed and December 2022. 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    QNS; Guardant 360 showed positive KRAS G12C mutation  FINAL MICROSCOPIC DIAGNOSIS:   A. LUNG, RUL, FINE NEEDLE ASPIRATION:  - Malignant cells consistent with non-small cell carcinoma, see comment   B. LUNG, RUL, BRUSHING:  - Malignant cells consistent with non-small cell carcinoma, see comment       COMMENT:   A and B.  Dr. Belvie reviewed the case and concurs with the diagnosis.  Only rare malignant cells are present on the cellblock.  Immunohistochemical stains were attempted and show that the tumor cells  have patchy staining for TTF-1 whereas p63, p40 and CK5/6 are negative.  The findings are nondiagnostic but suggestive of a lung adenocarcinoma.  Dr. Shelah was notified on 10/13/2021.   SEP-OCT 2022-  [Dermatology]-   Poorly differentiated squamous cell carcinoma; -  Carcinoma extends to the edges of the excision specimen   IMPRESSION: 1. The spiculated nodule at the right lung apex on recent neck CT is hypermetabolic and is concerning for primary bronchogenic carcinoma. Tissue sampling recommended. 2. Hypermetabolic activity within the occipital  scalp and small previously demonstrated right occipital lymph node compatible with known squamous cell carcinoma. Evaluation of the head and neck limited by motion artifact. 3. Hypermetabolic lesions inferiorly in the right hepatic lobe and in the proximal right femoral diaphysis suspicious for metastatic disease, primary uncertain in this patient with a history of prostate cancer. Correlate with PSA levels. Abdominal MRI without and with contrast may be helpful for further characterization of the liver lesion.  # s/p RT to RUL [[place]]; right hip RT; # BONE METASTASES:  Impending right hip fracture right hip pain-status post radiation [[facility]; DEC 2022]- S/p  Re-RT on 10/03 x5 Fx. NOV 2023-status post intramedullary nail fixation;JUNE 2024 [[place]] Re-surgery-    Awaiting dental clearance for zometa.    # 2007MI-CAD [s/p stent-Dr.Berry; EF 2021- 58%]     Primary adenocarcinoma of upper lobe of right lung (HCC)  10/18/2021 Initial Diagnosis   Primary adenocarcinoma of upper lobe of right lung (HCC)   10/18/2021 Cancer Staging   Staging form: Lung, AJCC 8th Edition - Clinical: Stage IVB (cT1c, cN0, cM1c) - Signed by Sherrod Sherrod, MD on 10/18/2021   11/01/2021 - 05/30/2022 Chemotherapy   Patient is on Treatment Plan : LUNG NSCLC Pemetrexed  + Carboplatin  q21d x 4 Cycles     11/01/2021 -  Chemotherapy   Patient is on Treatment Plan : LUNG NSCLC Libtayo  q  21d       HISTORY OF PRESENTING ILLNESS: Ambulating with a cane.  Accompanied by his wife.  TREYSHAUN [name] 64 y.o.  male metastatic stage IV non-small cell lung cancer/favor adenocarcinoma, currently OFF maintenance libtayo /on surveillance, returns to clinic for follow up/a dn review the results of the CT scan.  Discussed the use of AI scribe software for clinical note transcription with the patient, who gave verbal consent to proceed.  History of Present Illness   Chris [name] is a 64 year old male with metastatic lung  adenocarcinoma who presents for follow-up and review of surveillance CT imaging.  He completed immunotherapy (Libtayo ) for metastatic lung adenocarcinoma one year ago, with no additional cancer-directed therapy since. Surveillance CT imaging demonstrates stable right upper lobe pulmonary nodules and a small, stable hepatic lesion, with no interval change in size or number. He expresses confusion regarding the radiology reports. He denies new or worsening respiratory symptoms.  He reports persistent fatigue, describing himself as tired constantly and sleeping for extended periods, with minimal motivation for activity. He denies recent illness, acute health changes, or new neurological symptoms. He also endorses chronic neck pain, which has worsened recently. He is able to ambulate.  Recent laboratory studies, including thyroid  function and cholesterol, are within normal limits. He has not had recent evaluation of testosterone  or cortisol levels. He is not currently taking any cancer-directed medications.       Review of Systems  Constitutional:  Positive for malaise/fatigue. Negative for chills, diaphoresis, fever and weight loss.  HENT:  Positive for hearing loss. Negative for nosebleeds and sore throat.   Eyes:  Negative for double vision.  Respiratory:  Negative for cough, hemoptysis, sputum production, shortness of breath and wheezing.   Cardiovascular:  Negative for chest pain, palpitations, orthopnea and leg swelling.  Gastrointestinal:  Negative for abdominal pain, blood in stool, diarrhea, heartburn, melena, nausea and vomiting.  Genitourinary:  Negative for dysuria, frequency and urgency.  Musculoskeletal:  Positive for back pain and joint pain.  Skin: Negative.  Negative for itching and rash.  Neurological:  Negative for dizziness, tingling, focal weakness, weakness and headaches.  Endo/Heme/Allergies:  Does not bruise/bleed easily.  Psychiatric/Behavioral:  Negative for depression.  The patient does not have insomnia.      MEDICAL HISTORY:  Past Medical History:  Diagnosis Date   Anxiety    associated with medical care,  worsened by difficulty hearing, does better with wife present   Arthritis    Cancer associated pain    Cancer, metastatic to liver [facility])    Complication of anesthesia    wife states very anxious may need pre sedation   COPD (chronic obstructive pulmonary disease) (HCC)    Coronary artery disease    Dyspnea    GERD (gastroesophageal reflux disease)    Hearing loss    no hearing aids per wife   Hyperlipidemia    Hypertension    MI (myocardial infarction) (HCC) 04/17/2006   Acute inferolateral wall MI with cardiogenic shock and complete heart block   Primary adenocarcinoma of upper lobe of right lung (HCC)    Prostate cancer (HCC)    Sleep apnea    Tobacco abuse    Wears glasses    Wears partial dentures    Upper    SURGICAL HISTORY: Past Surgical History:  Procedure Laterality Date   BRONCHIAL BIOPSY  10/09/2021   Procedure: BRONCHIAL BIOPSIES;  Surgeon: Shelah Lamar RAMAN, MD;  Location: [facility] ENDOSCOPY;  Service: Pulmonary;;   BRONCHIAL BRUSHINGS  10/09/2021   Procedure: BRONCHIAL BRUSHINGS;  Surgeon: Shelah Lamar RAMAN, MD;  Location[facility] ENDOSCOPY;  Service: Pulmonary;;   BRONCHIAL NEEDLE ASPIRATION BIOPSY  10/09/2021   Procedure: BRONCHIAL NEEDLE ASPIRATION BIOPSIES;  Surgeon: Shelah Lamar RAMAN, MD;  Location: [facility] ENDOSCOPY;  Service: Pulmonary;;   CARDIAC CATHETERIZATION  2007   left, RCA 100% occluded ruptured plaque with thrombus in the proximal segment   CONVERSION TO TOTAL HIP Right 02/26/2024   Procedure: RESECTION PROXIMAL BONE TUMOR FEMUR, HEMIARTHROPLASTY, LOCAL TISSUE ADVANCEMENT;  Surgeon: Edna Toribio LABOR, MD;  Location: [facility] ORS;  Service: Orthopedics;  Laterality: Right;   CYST EXCISION N/A 08/30/2021   Procedure: EXCISION OF POSTERIOR SCALP CYST;  Surgeon: Signe Mitzie LABOR, MD;  Location: [facility] ORS;  Service: General;   Laterality: N/A;   CYSTOSCOPY N/A 07/17/2018   Procedure: PHYLLIS;  Surgeon: Matilda Senior, MD;  Location: [facility];  Service: Urology;  Laterality: N/A;  no seeds in bladder per Dr Matilda   FEMUR IM NAIL Right 04/03/2023   Procedure: REVISION FIXATION OF RIGHT FEMUR NONUNION;  Surgeon: Kendal Franky SQUIBB, MD;  Location: [facility] OR;  Service: Orthopedics;  Laterality: Right;   FIDUCIAL MARKER PLACEMENT  10/09/2021   Procedure: FIDUCIAL MARKER PLACEMENT;  Surgeon: Shelah Lamar RAMAN, MD;  Location: [facility] END[facility]COPY;  Service: Pulmonary;;   HAND SURGERY Right    has metal plate in arm   HARDWARE REMOVAL Right 04/03/2023   Procedure: REMOVAL OF PREVIOUS HARDWARE FEMUR;  Surgeon: Kendal Franky SQUIBB, MD;  Location: [facility] OR;  Service: Orthopedics;  Laterality: Right;   PROSTATE BIOPSY     RADIOACTIVE SEED IMPLANT N/A 07/17/2018   Procedure: RADIOACTIVE SEED IMPLANT/BRACHYTHERAPY IMPLANT;  Surgeon: Matilda Senior, MD;  Location: [facility];  Service: Urology;  Laterality: N/A;  77 seeds   SPACE OAR INSTILLATION N/A 07/17/2018   Procedure: SPACE OAR INSTILLATION;  Surgeon: Matilda Senior, MD;  Location: [facility];  Service: Urology;  Laterality: N/A;   VIDEO BRONCHOSCOPY WITH RADIAL ENDOBRONCHIAL ULTRASOUND  10/09/2021   Procedure: VIDEO BRONCHOSCOPY WITH RADIAL ENDOBRONCHIAL ULTRASOUND;  Surgeon: Shelah Lamar RAMAN, MD;  Location: [facility] ENDOSCOPY;  Service: Pulmonary;;   WRIST SURGERY  10/04/2012    SOCIAL HISTORY: Social History   Socioeconomic History   Marital status: Married    Spouse name: Not on file   Number of children: Not on file   Years of education: Not on file   Highest education level: Not on file  Occupational History   Not on file  Tobacco Use   Smoking status: Every Day    Current packs/day: 0.75    Average packs/day: 0.8 packs/day for 42.0 years (31.5 ttl pk-yrs)    Types: Cigarettes   Smokeless tobacco: Never   Tobacco  comments:    Smokes 0.25 PPD - [facility] - 02/12/24     Started smoking at 64 years old    Smoked 2 PPD at his heaviest.   Vaping Use   Vaping status: Never Used  Substance and Sexual Activity   Alcohol  use: No   Drug use: No   Sexual activity: Yes    Birth control/protection: None  Other Topics Concern   Not on file  Social History Narrative   10-04 19 Unable to ask abuse questions wife with him today.   Are you right handed or left handed? Ambidextrous prominent right   Are you currently employed ? 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 no   Who lives with you? Wife and patient   What type of home do you live in: 1 story or 2 story? 1 story       Social Drivers of Health   Tobacco Use: High Risk (11/11/2024)   Patient History  Smoking Tobacco Use: Every Day    Smokeless Tobacco Use: Never    Passive Exposure: Not on file  Financial Resource Strain: Not on file  Food Insecurity: No Food Insecurity (02/26/2024)   Hunger Vital Sign    Worried About Running Out of Food in the Last Year: Never true    Ran Out of Food in the Last Year: Never true  Transportation Needs: No Transportation Needs (02/26/2024)   PRAPARE - Administrator, Civil Service (Medical): No    Lack of Transportation (Non-Medical): No  Physical Activity: Not on file  Stress: Not on file  Social Connections: Not on file  Intimate Partner Violence: Not At Risk (02/26/2024)   Humiliation, Afraid, Rape, and Kick questionnaire    Fear of Current or Ex-Partner: No    Emotionally Abused: No    Physically Abused: No    Sexually Abused: No  Depression (PHQ2-9): Low Risk (08/13/2024)   Depression (PHQ2-9)    PHQ-2 Score: 0  Alcohol  Screen: Not on file  Housing: Low Risk (02/26/2024)   Housing Stability Vital Sign    Unable to Pay for Housing in the Last Year: No    Number of Times Moved in the Last Year: 0    Homeless in the Last Year: No  Utilities: Not At  Risk (02/26/2024)   AHC Utilities    Threatened with loss of utilities: No  Health Literacy: Not on file    FAMILY HISTORY: Family History  Problem Relation Age of Onset   Breast cancer Mother    Prostate cancer Neg Hx    Kidney cancer Neg Hx    Cancer Neg Hx     ALLERGIES:  is allergic to pollen extract.  MEDICATIONS:  Current Outpatient Medications  Medication Sig Dispense Refill   atorvastatin  (LIPITOR) 40 MG tablet Take 1 tablet (40 mg total) by mouth daily. 90 tablet 3   budesonide -glycopyrrolate -formoterol  (BREZTRI  AEROSPHERE) 160-9-4.8 MCG/ACT AERO inhaler Inhale 2 puffs into the lungs in the morning and at bedtime. 2 each    clopidogrel  (PLAVIX ) 75 MG tablet Take 1 tablet (75 mg total) by mouth daily.     metoprolol  tartrate (LOPRESSOR ) 25 MG tablet Take 1 tablet (25 mg total) by mouth daily. 90 tablet 3   Oxycodone  HCl 10 MG TABS Take 0.5-1 tablets (5-10 mg total) by mouth every 4 (four) hours as needed. 60 tablet 0   pantoprazole  (PROTONIX ) 40 MG tablet TAKE 1 TABLET BY MOUTH EVERY DAY 90 tablet 1   sertraline  (ZOLOFT ) 100 MG tablet Take 100 mg by mouth daily.     tamsulosin  (FLOMAX ) 0.4 MG CAPS capsule Take 1 capsule (0.4 mg total) by mouth daily. 90 capsule 3   Vitamin D , Ergocalciferol , (DRISDOL ) 1.25 MG (50000 UNIT) CAPS capsule Take 1 capsule (50,000 Units total) by mouth every 7 (seven) days. 12 capsule 1   levalbuterol  (XOPENEX  HFA) 45 MCG/ACT inhaler Inhale 2 puffs into the lungs every 6 (six) hours as needed for wheezing or shortness of breath. (Patient not taking: Reported on 11/11/2024) 1 each 2   oxyCODONE  ER (XTAMPZA  ER) 9 MG C12A Take 9 mg by mouth in the morning and at bedtime. (Patient not taking: Reported on 11/11/2024) 14 capsule 0   No current facility-administered medications for this visit.    PHYSICAL EXAMINATION: ECOG PERFORMANCE STATUS: 1 - Symptomatic but completely ambulatory  Vitals:   11/11/24 1300  BP: (!) 142/92  Pulse: 70  Resp: 19   Temp: 98.6 F (  37 C)  SpO2: 99%      Filed Weights   11/11/24 1300  Weight: 235 lb (106.6 kg)    Physical Exam Vitals reviewed.  HENT:     Head: Normocephalic and atraumatic.     Mouth/Throat:     Pharynx: Oropharynx is clear.  Eyes:     Extraocular Movements: Extraocular movements intact.     Pupils: Pupils are equal, round, and reactive to light.  Cardiovascular:     Rate and Rhythm: Normal rate and regular rhythm.  Pulmonary:     Effort: No respiratory distress.     Comments: Decreased breath sounds bilaterally.  Abdominal:     General: There is no distension.     Palpations: Abdomen is soft.  Skin:    General: Skin is warm.     Coloration: Skin is not pale.  Neurological:     Mental Status: He is alert and oriented to person, place, and time.  Psychiatric:        Mood and Affect: Mood normal.        Behavior: Behavior normal.     LABORATORY DATA:  I have reviewed the data as listed Lab Results  Component Value Date   WBC 6.9 11/11/2024   HGB 14.6 11/11/2024   HCT 44.6 11/11/2024   MCV 86.3 11/11/2024   PLT 225 11/11/2024   Recent Labs    02/24/24 0948 02/27/24 0334 08/13/24 1521 11/11/24 1308  NA 139 135 138 141  K 4.4 3.6 3.9 4.1  CL 104 104 106 105  CO2 25 21* 23 21*  GLUCOSE 121* 136* 125* 113*  BUN 12 12 24* 16  CREATININE 1.01 1.02 1.30* 1.16  CALCIUM  9.5 8.4* 8.8* 9.6  GFRNONAA >60 >60 >60 >60  PROT 7.4  --  7.1 7.4  ALBUMIN  4.1  --  3.9 4.6  AST 19  --  19 19  ALT 21  --  17 16  ALKPHOS 66  --  89 101  BILITOT 0.8  --  0.6 0.5    RADIOGRAPHIC STUDIES: I have personally reviewed the radiological images as listed and agreed with the findings in the report. CT CHEST ABDOMEN PELVIS W CONTRAST Result Date: 11/04/2024 EXAM: CT CHEST, ABDOMEN AND PELVIS WITH CONTRAST 10/27/2024 09:50:02 AM TECHNIQUE: CT of the chest, abdomen and pelvis was performed with the administration of 100 mL of iopamidol  (ISOVUE -300) 61% injection. Multiplanar  reformatted images are provided for review. Automated exposure control, iterative reconstruction, and/or weight based adjustment of the mA/kV was utilized to reduce the radiation dose to as low as reasonably achievable. COMPARISON: CT dated 07/29/2024 and CT dated 04/10/2024. CLINICAL HISTORY: lung cancer. Dx: Primary adenocarcinoma of upper lobe of right lung (HCC). * Tracking Code: BO * FINDINGS: CHEST: MEDIASTINUM AND LYMPH NODES: Heart and pericardium are unremarkable. Normal great vessels. Aortic calcification is noted. The central airways are clear. No mediastinal, hilar or axillary lymphadenopathy. LUNGS AND PLEURA: 2 adjacent nodules at the right lung apex with associated linear scarring are not changed significantly from prior. Larger nodule measures 17 mm on coronal image 94 series 5 compared to 17 mm on CT from 07/29/2024 and 04/10/2024. Smaller posterior nodule on image 86 is also unchanged. While these nodules have suspicious morphology with spiculated margins the stability over time is reassuring. No other nodules in the left or right lung. No focal consolidation or pulmonary edema. No pleural effusion. No pneumothorax. ABDOMEN AND PELVIS: LIVER: Small hypodensity in the right hepatic lobe measuring  less than 10 mm on image 69 series 2. This lesion is located on comparison exam and unchanged. Similar small 6 mm hypodensity in the lateral segment of the left hepatic lobe is also unchanged. No new hepatic lesions. GALLBLADDER AND BILE DUCTS: Unremarkable. No biliary ductal dilatation. SPLEEN: No acute abnormality. PANCREAS: No acute abnormality. ADRENAL GLANDS: Nodular enlargement of the left and right adrenal glands is not changed from comparison exams. Favor benign bilateral adrenal hyperplasia. KIDNEYS, URETERS AND BLADDER: No stones in the kidneys or ureters. No hydronephrosis. No perinephric or periureteral stranding. Urinary bladder is unremarkable. GI AND BOWEL: Stomach demonstrates no acute  abnormality. There is no bowel obstruction. REPRODUCTIVE ORGANS: No acute abnormality. PERITONEUM AND RETROPERITONEUM: No ascites. No free air. VASCULATURE: Aorta is normal in caliber. ABDOMINAL AND PELVIS LYMPH NODES: No lymphadenopathy. BONES AND SOFT TISSUES: No acute osseous abnormality. No focal soft tissue abnormality. IMPRESSION: 1. Stable 2 adjacent nodules at the right lung apex with associated linear scarring with reassuring stability over time. 2. No evidence of metastatic disease in the chest, abdomen, or pelvis. Electronically signed by: Norleen Boxer MD MD 11/04/2024 04:49 PM EST RP Workstation: [id number]    ASSESSMENT & PLAN:    Primary adenocarcinoma of upper lobe of right lung (HCC) # Non-small cell lung cancer/stage IV-favor adeno carcinoma-metastases to right femur/liver; synchronous squamous cell carcinoma scalp DEC 30th, 2025- Stable; adjacent nodules at the right lung apex with associated linear scarring with reassuring stability over time. No evidence of metastatic disease in the chest, abdomen, or pelvis  PSA- June 2025- < 0.1 [PCP]; Otherwise no evidence of lymphadenopathy or metastatic disease in the chest, abdomen, or pelvis.JAN 3rd, 2025- last libtayo .   # CONTINUE to HOLD Single agent Libtayo  maintenance [given the ongoing severe fatigue memory issues and overall stability of the disease]- Labs today reviewed; will continue surveillance-with imaging every 3-6 months or so. Will discuss at next visit  # Extreme fatigue-unlikely any CNS causes; check cortisol and also testosterone  levels.    # BONE METASTASES: Hx Impending right hip fracture right hip pain-status post radiation [[facility]; DEC 2022]- S/p  Re-RT on 10/03 x5 Fx. NOV 2023-status post intramedullary nail fixation;JUNE 2024 [[place]] status post-revision surgery May 2025.  Stable.  Hold Zometa for now.  # Low vit D- [July 2023- vit D- 25]; on  ergocalciferol - Calcium  9.5 improved/stable. FEB vit D-25-OH- 41. Stable.      # Squamous cell carcinoma of the scalp-s/p excision; positive margins- on libtayo -no clinical evidence of progression.  Might need to consider radiation-if any local progression noted/also based on course lung cancer. Stable.     # COPD-recommend follow-up with pulmonary, [facility] [place]- Stable.  ? OSA- causing fatigue- defer to pulmonary-Stable.       # GERD- on prilosec continue PPI BID prior to meals-  Stable.     # weight loss: sec to teeth exatraction-   # IV access: PIV  # DISPOSITION: # labs in AM- ordered- testosterone / cortisol-  # follow up 3 months- MD;labs- cbc/cmp;TSH;-  Dr.B   # I reviewed the blood work- with the patient in detail; also reviewed the imaging independently [as summarized above]; and with the patient in detail.      All questions were answered. The patient knows to call the clinic with any problems, questions or concerns.   Cindy JONELLE Joe, MD 11/11/2024

## 2024-11-12 ENCOUNTER — Encounter: Payer: Self-pay | Admitting: Internal Medicine

## 2024-11-13 ENCOUNTER — Other Ambulatory Visit

## 2024-11-23 ENCOUNTER — Ambulatory Visit: Admitting: Cardiovascular Disease

## 2024-11-25 ENCOUNTER — Inpatient Hospital Stay: Admitting: Internal Medicine

## 2024-11-25 ENCOUNTER — Inpatient Hospital Stay

## 2024-12-01 ENCOUNTER — Ambulatory Visit: Admitting: Cardiovascular Disease

## 2024-12-02 ENCOUNTER — Encounter: Payer: Self-pay | Admitting: Internal Medicine

## 2024-12-14 ENCOUNTER — Ambulatory Visit: Admitting: Cardiology

## 2024-12-28 ENCOUNTER — Ambulatory Visit: Admitting: Pulmonary Disease

## 2024-12-29 ENCOUNTER — Ambulatory Visit: Admitting: Cardiovascular Disease

## 2025-02-09 ENCOUNTER — Inpatient Hospital Stay

## 2025-02-09 ENCOUNTER — Inpatient Hospital Stay: Admitting: Internal Medicine
